# Patient Record
Sex: Male | Born: 1935 | Race: White | Hispanic: No | Smoking: Former smoker
Health system: Southern US, Community
[De-identification: ages and names within clinical notes are randomized; demographics above are authoritative.]

## PROBLEM LIST (undated history)

## (undated) DIAGNOSIS — J449 Chronic obstructive pulmonary disease, unspecified: Secondary | ICD-10-CM

## (undated) DIAGNOSIS — T7840XA Allergy, unspecified, initial encounter: Secondary | ICD-10-CM

## (undated) DIAGNOSIS — I2510 Atherosclerotic heart disease of native coronary artery without angina pectoris: Secondary | ICD-10-CM

## (undated) DIAGNOSIS — I10 Essential (primary) hypertension: Secondary | ICD-10-CM

## (undated) DIAGNOSIS — I480 Paroxysmal atrial fibrillation: Secondary | ICD-10-CM

## (undated) DIAGNOSIS — C679 Malignant neoplasm of bladder, unspecified: Secondary | ICD-10-CM

## (undated) DIAGNOSIS — I428 Other cardiomyopathies: Secondary | ICD-10-CM

## (undated) DIAGNOSIS — R32 Unspecified urinary incontinence: Secondary | ICD-10-CM

## (undated) DIAGNOSIS — I5042 Chronic combined systolic (congestive) and diastolic (congestive) heart failure: Secondary | ICD-10-CM

## (undated) DIAGNOSIS — E118 Type 2 diabetes mellitus with unspecified complications: Secondary | ICD-10-CM

## (undated) DIAGNOSIS — G250 Essential tremor: Secondary | ICD-10-CM

## (undated) DIAGNOSIS — E785 Hyperlipidemia, unspecified: Secondary | ICD-10-CM

## (undated) HISTORY — DX: Type 2 diabetes mellitus with unspecified complications: E11.8

## (undated) HISTORY — DX: Chronic obstructive pulmonary disease, unspecified: J44.9

## (undated) HISTORY — DX: Paroxysmal atrial fibrillation: I48.0

## (undated) HISTORY — PX: OTHER SURGICAL HISTORY: SHX169

## (undated) HISTORY — DX: Chronic combined systolic (congestive) and diastolic (congestive) heart failure: I50.42

## (undated) HISTORY — PX: HERNIA REPAIR: SHX51

## (undated) HISTORY — DX: Hyperlipidemia, unspecified: E78.5

## (undated) HISTORY — DX: Malignant neoplasm of bladder, unspecified: C67.9

## (undated) HISTORY — DX: Atherosclerotic heart disease of native coronary artery without angina pectoris: I25.10

## (undated) HISTORY — PX: CATARACT EXTRACTION: SUR2

## (undated) HISTORY — DX: Other cardiomyopathies: I42.8

## (undated) HISTORY — DX: Essential tremor: G25.0

## (undated) HISTORY — DX: Allergy, unspecified, initial encounter: T78.40XA

## (undated) HISTORY — DX: Unspecified urinary incontinence: R32

## (undated) HISTORY — DX: Essential (primary) hypertension: I10

---

## 2003-09-26 ENCOUNTER — Other Ambulatory Visit: Payer: Self-pay

## 2005-07-08 ENCOUNTER — Other Ambulatory Visit: Payer: Self-pay

## 2005-07-08 ENCOUNTER — Ambulatory Visit: Payer: Self-pay | Admitting: Urology

## 2005-07-12 ENCOUNTER — Ambulatory Visit: Payer: Self-pay | Admitting: Urology

## 2006-05-06 ENCOUNTER — Emergency Department: Payer: Self-pay | Admitting: Emergency Medicine

## 2006-05-07 ENCOUNTER — Other Ambulatory Visit: Payer: Self-pay

## 2009-12-13 ENCOUNTER — Inpatient Hospital Stay: Payer: Self-pay | Admitting: Internal Medicine

## 2010-07-06 ENCOUNTER — Ambulatory Visit: Payer: Self-pay | Admitting: Gastroenterology

## 2010-07-08 LAB — PATHOLOGY REPORT

## 2010-07-17 ENCOUNTER — Emergency Department: Payer: Self-pay | Admitting: Emergency Medicine

## 2010-08-05 ENCOUNTER — Emergency Department: Payer: Self-pay | Admitting: *Deleted

## 2010-08-09 ENCOUNTER — Emergency Department: Payer: Self-pay | Admitting: Emergency Medicine

## 2010-08-11 ENCOUNTER — Emergency Department: Payer: Self-pay | Admitting: Emergency Medicine

## 2011-01-31 ENCOUNTER — Ambulatory Visit: Payer: Self-pay | Admitting: Internal Medicine

## 2011-07-25 DIAGNOSIS — M79606 Pain in leg, unspecified: Secondary | ICD-10-CM | POA: Insufficient documentation

## 2011-08-10 DIAGNOSIS — E559 Vitamin D deficiency, unspecified: Secondary | ICD-10-CM | POA: Insufficient documentation

## 2011-08-30 ENCOUNTER — Ambulatory Visit: Payer: Self-pay | Admitting: Urology

## 2011-09-02 DIAGNOSIS — N138 Other obstructive and reflux uropathy: Secondary | ICD-10-CM | POA: Insufficient documentation

## 2011-09-02 DIAGNOSIS — N401 Enlarged prostate with lower urinary tract symptoms: Secondary | ICD-10-CM | POA: Insufficient documentation

## 2011-09-06 ENCOUNTER — Ambulatory Visit: Payer: Self-pay | Admitting: Urology

## 2011-09-09 LAB — PATHOLOGY REPORT

## 2011-12-15 ENCOUNTER — Ambulatory Visit: Payer: Self-pay | Admitting: General Practice

## 2011-12-15 LAB — CBC WITH DIFFERENTIAL/PLATELET
Basophil %: 1 %
Eosinophil #: 0.3 10*3/uL (ref 0.0–0.7)
Eosinophil %: 3.4 %
HGB: 14.1 g/dL (ref 13.0–18.0)
Lymphocyte #: 2.1 10*3/uL (ref 1.0–3.6)
Lymphocyte %: 22 %
MCHC: 33.3 g/dL (ref 32.0–36.0)
MCV: 102 fL — ABNORMAL HIGH (ref 80–100)
Monocyte #: 0.8 x10 3/mm (ref 0.2–1.0)
Monocyte %: 8.4 %
Neutrophil %: 65.2 %
RBC: 4.16 10*6/uL — ABNORMAL LOW (ref 4.40–5.90)
RDW: 13 % (ref 11.5–14.5)
WBC: 9.6 10*3/uL (ref 3.8–10.6)

## 2011-12-15 LAB — BASIC METABOLIC PANEL
BUN: 16 mg/dL (ref 7–18)
Chloride: 106 mmol/L (ref 98–107)
Co2: 29 mmol/L (ref 21–32)
Creatinine: 1.15 mg/dL (ref 0.60–1.30)
Potassium: 4.2 mmol/L (ref 3.5–5.1)
Sodium: 139 mmol/L (ref 136–145)

## 2012-02-04 LAB — HM DIABETES EYE EXAM

## 2012-02-13 ENCOUNTER — Ambulatory Visit: Payer: Self-pay | Admitting: Internal Medicine

## 2012-08-01 ENCOUNTER — Encounter: Payer: Self-pay | Admitting: Adult Health

## 2012-08-01 ENCOUNTER — Ambulatory Visit (INDEPENDENT_AMBULATORY_CARE_PROVIDER_SITE_OTHER): Payer: Medicare Other | Admitting: Adult Health

## 2012-08-01 VITALS — BP 108/62 | HR 94 | Temp 97.9°F | Resp 14 | Ht 71.5 in | Wt 216.5 lb

## 2012-08-01 DIAGNOSIS — E119 Type 2 diabetes mellitus without complications: Secondary | ICD-10-CM

## 2012-08-01 DIAGNOSIS — G20 Parkinson's disease: Secondary | ICD-10-CM

## 2012-08-01 DIAGNOSIS — R7989 Other specified abnormal findings of blood chemistry: Secondary | ICD-10-CM

## 2012-08-01 DIAGNOSIS — E291 Testicular hypofunction: Secondary | ICD-10-CM

## 2012-08-01 DIAGNOSIS — E118 Type 2 diabetes mellitus with unspecified complications: Secondary | ICD-10-CM | POA: Insufficient documentation

## 2012-08-01 DIAGNOSIS — I10 Essential (primary) hypertension: Secondary | ICD-10-CM | POA: Insufficient documentation

## 2012-08-01 HISTORY — DX: Type 2 diabetes mellitus with unspecified complications: E11.8

## 2012-08-01 MED ORDER — RIVASTIGMINE 13.3 MG/24HR TD PT24: 1.0000 | MEDICATED_PATCH | Freq: Every day | TRANSDERMAL | Status: DC

## 2012-08-01 MED ORDER — PAROXETINE HCL 40 MG PO TABS: 40.0000 mg | ORAL_TABLET | Freq: Every day | ORAL | Status: DC

## 2012-08-01 MED ORDER — TIOTROPIUM BROMIDE MONOHYDRATE 18 MCG IN CAPS: 18.0000 ug | ORAL_CAPSULE | Freq: Every day | RESPIRATORY_TRACT | Status: DC

## 2012-08-01 MED ORDER — METFORMIN HCL 1000 MG PO TABS: 1000.0000 mg | ORAL_TABLET | Freq: Two times a day (BID) | ORAL | Status: DC

## 2012-08-01 MED ORDER — GLYBURIDE 5 MG PO TABS: 5.0000 mg | ORAL_TABLET | Freq: Two times a day (BID) | ORAL | Status: DC

## 2012-08-01 NOTE — Assessment & Plan Note (Signed)
H/O low testosterone on replacement IM every 2 weeks. He is followed by Dr. Achilles Dunk for this. Explained to patient that he will need to continue to be follow by Dr. Achilles Dunk for his medication and monitoring.

## 2012-08-01 NOTE — Progress Notes (Signed)
Subjective:    Patient ID: Chase Simmons, male    DOB: 08-26-1933, 79 y.o.   MRN: [id number]  HPI  Patient presents to clinic to establish care. Most recently he was followed at [facility] in [facility] by Dr. Dayna Barker. He was also seen by a Neurologist at [facility]; however, patient does not remember doctors name. He has been seen by Dr. Cassie Freer approximately one year ago but he reports he will not return to see him. Patient is not a good historian. I will try to obtain medical records from previous PCP and specialists.  Patient states that he has trouble walking and that "it is all due to low testosterone". He has been followed by Dr. Achilles Dunk for bladder cancer and reports that Dr. Achilles Dunk prescribed his testosterone and monitored him. Patient, however, is requesting that I take this on and be the one to prescribe his medication.   Past Medical History  Diagnosis Date  . Cancer pt unsure of year    bladder cancer-Dr. Achilles Dunk  . Diabetes mellitus without complication   . COPD (chronic obstructive pulmonary disease)   . Allergy   . Arrhythmia   . Hypertension   . Hyperlipidemia   . Urinary incontinence   . UTI (lower urinary tract infection)   . Parkinson's disease     sees [facility] neurologist    Surgical Hx: Patient reports none.   Family History  Problem Relation Age of Onset  . Arthritis Mother   . Cancer Maternal Aunt     breast cancer  . Cancer Maternal Uncle     prostate    History   Social History  . Marital Status: Married    Spouse Name: N/A    Number of Children: 3  . Years of Education: 12   Occupational History  . Retired Naval architect    Social History Main Topics  . Smoking status: Former Smoker -- 1.50 packs/day for 40 years  . Smokeless tobacco: Never Used  . Alcohol Use: 6.0 oz/week    12 drink(s) per week     Comment: 1-2 drinks daily  . Drug Use: No  . Sexually Active: Not on file   Other Topics Concern  . Not on file   Social History Narrative   Patient grew up in [place], [place]. He is separated from his wife since 30. They never divorced. Patient has 1 son and 2 daughters. 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     Review of Systems  Constitutional: Negative.   HENT: Negative.   Eyes:       Yearly eye exam for diabetes at [facility]  Respiratory: Positive for shortness of breath. Negative for cough, chest tightness and wheezing.   Cardiovascular: Negative for chest pain.       Irregular heart rhythm  Gastrointestinal: Negative for nausea, vomiting, abdominal pain, diarrhea and blood in stool.  Genitourinary: Negative for dysuria, hematuria and difficulty urinating.  Musculoskeletal: Positive for arthralgias. Negative for myalgias.       Bilateral knee discomfort  Skin:       Thin frail skin  Allergic/Immunologic: Negative.   Neurological: Positive for tremors. Negative for dizziness, light-headedness, numbness and headaches.  Hematological: Negative.   Psychiatric/Behavioral: Negative for suicidal ideas, hallucinations, behavioral problems, sleep disturbance and agitation. The patient is not nervous/anxious.    BP 108/62  Pulse 94  Temp(Src) 97.9 F (36.6 C) (Oral)  Resp 14  Ht 5' 11.5" (1.816 m)  Wt  216 lb 8 oz (98.204 kg)  BMI 29.78 kg/m2  SpO2 99%    Objective:   Physical Exam  Constitutional:  Overweight, male in NAD  HENT:  Head: Normocephalic and atraumatic.  Right Ear: External ear normal.  Left Ear: External ear normal.  Nose: Nose normal.  Mouth/Throat: Oropharynx is clear and moist.  Eyes: Conjunctivae and EOM are normal. Pupils are equal, round, and reactive to light. Right eye exhibits no discharge. Left eye exhibits no discharge.  Blepharoptosis and periorbital puffiness OU  Neck: Normal range of motion. Neck supple. No tracheal deviation present. No thyromegaly present.  Cardiovascular: Normal rate, regular rhythm, normal heart sounds and intact distal pulses.  Exam reveals  no gallop and no friction rub.   No murmur heard. Pulmonary/Chest: Effort normal and breath sounds normal. No respiratory distress. He has no wheezes. He has no rales.  Abdominal: Soft. Bowel sounds are normal.  Musculoskeletal: He exhibits tenderness. He exhibits no edema.  Limited ROM. Decreased flexibility. Mild difficulty getting up from sitting position. Bilateral knee tenderness.  Lymphadenopathy:    He has no cervical adenopathy.  Neurological: He is alert. Coordination abnormal.  Significant hand tremors. Seen by Neurology at [facility]. Will request medical records.  Skin: Skin is warm and dry.  Psychiatric: He has a normal mood and affect.  Patient's behavior was somewhat odd. Patient attempted to bite the otoscope when asked to open mouth to examine pharynx.  Inapproriate references to prostitutes/prostitution.       Assessment & Plan:

## 2012-08-01 NOTE — Assessment & Plan Note (Signed)
Patient has seen a neurologist at [facility]. He he did not return for followup. It is uncertain whether he has a diagnosis of Parkinson's. He has significant upper extremity tremors. Request medical records from neurologist.

## 2012-08-01 NOTE — Assessment & Plan Note (Signed)
Well controlled on current medications. Request patient's medical records from previous PCP

## 2012-08-01 NOTE — Patient Instructions (Addendum)
   Thank you for choosing [facility] at [facility] for your health care needs.  I will request your medical records from your previous physicians.  Please call if any questions or concerns.

## 2012-08-01 NOTE — Assessment & Plan Note (Addendum)
Patient reports that his blood sugars are running around 140. However, he is not checking these often. Poor historian and unable to tell me when was the last time he had a hemoglobin A1c checked or other labs. Request medical records from previous PCP to determine further plan of care. Note, greater than 60 min were spent in face to face communication with patient in obtaining a medical history which was quite a challenge given patient poor recall of events, names of physicians, etc. Additional time spent in the assessment, evaluation, planning and implementation of plan of care.

## 2012-08-09 ENCOUNTER — Telehealth: Payer: Self-pay | Admitting: Adult Health

## 2012-08-09 NOTE — Telephone Encounter (Signed)
States if his records have not yet been received and you need any information, he has information regarding his heart doctor from [facility] and lung doctor from [facility], so please call him with any questions.

## 2012-08-09 NOTE — Telephone Encounter (Signed)
Records have already been sent for

## 2012-10-10 ENCOUNTER — Other Ambulatory Visit: Payer: Self-pay | Admitting: Adult Health

## 2012-10-22 ENCOUNTER — Ambulatory Visit (INDEPENDENT_AMBULATORY_CARE_PROVIDER_SITE_OTHER): Payer: Medicare Other | Admitting: Adult Health

## 2012-10-22 ENCOUNTER — Encounter: Payer: Self-pay | Admitting: Adult Health

## 2012-10-22 ENCOUNTER — Ambulatory Visit: Payer: Medicare Other | Admitting: Adult Health

## 2012-10-22 ENCOUNTER — Encounter (INDEPENDENT_AMBULATORY_CARE_PROVIDER_SITE_OTHER): Payer: Self-pay

## 2012-10-22 VITALS — BP 118/72 | HR 72 | Temp 97.8°F | Resp 12 | Wt 254.5 lb

## 2012-10-22 DIAGNOSIS — I10 Essential (primary) hypertension: Secondary | ICD-10-CM

## 2012-10-22 DIAGNOSIS — E119 Type 2 diabetes mellitus without complications: Secondary | ICD-10-CM

## 2012-10-22 DIAGNOSIS — E785 Hyperlipidemia, unspecified: Secondary | ICD-10-CM | POA: Insufficient documentation

## 2012-10-22 DIAGNOSIS — G250 Essential tremor: Secondary | ICD-10-CM

## 2012-10-22 MED ORDER — ALPRAZOLAM 0.25 MG PO TABS: ORAL_TABLET | ORAL | Status: DC

## 2012-10-22 MED ORDER — PRIMIDONE 50 MG PO TABS: ORAL_TABLET | ORAL | Status: DC

## 2012-10-22 NOTE — Patient Instructions (Signed)
  Stop Exelon patch  Primidone 50 mg tablets as follows:  Week #1 - take 1/2 tablet at bedtime  Week #2 - take 1 tablet at bedtime  Week #3 - take 1/2 tablet in the morning and 1 tablet at bedtime  Week #4 - take 1 tablet in the morning and 1 tablet at bedtime

## 2012-10-22 NOTE — Assessment & Plan Note (Signed)
Patient had been started on Exelon patches for the tremors. He reports the co-pays are too high. He is requesting an alternative. Start primidone 50 mg tablets as follows: Week # 1- take 1/2 tablet at bedtime; week #2 take 1 tablet at bedtime; week #3 take 1/2 tablet in the morning and 1 tablet at bedtime; week #4 take 1 tablet in the morning and 1 tablet at bedtime and continue.

## 2012-10-22 NOTE — Progress Notes (Signed)
Subjective:    Patient ID: Chase Simmons, male    DOB: Aug 02, 1933, 79 y.o.   MRN: [id number]  HPI  Patient is a 79 year old male with history of diabetes mellitus type 2, hypertension, hyperlipidemia, essential tremors who presents to clinic for followup. Patient reports that he is currently on Exelon patch for tremors. His insurance is getting ready to change and the Exelon patch is going to be greater than $100 co-pay. She is requesting an alternative medication for his tremors that is less expensive. He reports that he had seen a neurologist at [facility] who had recommended other options. Patient reports compliance with his medications. He reports that his daughter fills a pill box on a weekly basis for him.  Patient reports that his diabetes is well controlled. He reports blood sugars are running below 150. He is currently on metformin and glyburide.   Past Medical History  Diagnosis Date  . Cancer pt unsure of year    bladder cancer-Dr. Achilles Dunk  . Diabetes mellitus without complication   . COPD (chronic obstructive pulmonary disease)   . Allergy   . Arrhythmia   . Hypertension   . Hyperlipidemia   . Urinary incontinence   . UTI (lower urinary tract infection)   . Tremor, essential     sees [facility] neurologist    No past surgical history on file.   Family History  Problem Relation Age of Onset  . Arthritis Mother   . Cancer Maternal Aunt     breast cancer  . Cancer Maternal Uncle     prostate     History   Social History  . Marital Status: Married    Spouse Name: N/A    Number of Children: 3  . Years of Education: 12   Occupational History  . Retired Naval architect    Social History Main Topics  . Smoking status: Former Smoker -- 1.50 packs/day for 40 years  . Smokeless tobacco: Never Used  . Alcohol Use: 6.0 oz/week    12 drink(s) per week     Comment: 1-2 drinks daily  . Drug Use: No  . Sexual Activity: Not on file   Other Topics Concern  . Not on file   Social  History Narrative   Patient grew up in [place], [place]. He is separated from his wife since 76. They never divorced. Patient has 1 son and 2 daughters. 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      Current Outpatient Prescriptions on File Prior to Visit  Medication Sig Dispense Refill  . atorvastatin (LIPITOR) 80 MG tablet Take 80 mg by mouth daily.      . carvedilol (COREG) 12.5 MG tablet Take 12.5 mg by mouth 2 (two) times daily with a meal.      . glyBURIDE (DIABETA) 5 MG tablet Take 1 tablet (5 mg total) by mouth 2 (two) times daily with a meal.  60 tablet  6  . lisinopril (PRINIVIL,ZESTRIL) 10 MG tablet take 1 tablet by mouth once daily  90 tablet  2  . metFORMIN (GLUCOPHAGE) 1000 MG tablet Take 1 tablet (1,000 mg total) by mouth 2 (two) times daily with a meal.  60 tablet  6  . PARoxetine (PAXIL) 40 MG tablet Take 1 tablet (40 mg total) by mouth daily.  30 tablet  6  . tiotropium (SPIRIVA) 18 MCG inhalation capsule Place 1 capsule (18 mcg total) into inhaler and inhale daily.  30 capsule  6   No  current facility-administered medications on file prior to visit.      Review of Systems  Constitutional: Negative.   HENT: Negative.   Eyes: Negative.   Respiratory: Negative.  Negative for chest tightness, shortness of breath and wheezing.   Cardiovascular: Negative.  Negative for chest pain.  Gastrointestinal: Negative.   Endocrine: Negative.   Genitourinary: Negative.   Musculoskeletal: Positive for arthralgias. Negative for gait problem and myalgias.  Skin: Negative.   Allergic/Immunologic: Negative.   Neurological: Positive for tremors. Negative for dizziness, light-headedness and headaches.  Hematological: Negative.   Psychiatric/Behavioral: Negative.        Objective:   Physical Exam  Constitutional: He is oriented to person, place, and time.  79 year old male in no apparent distress  HENT:  Head: Normocephalic and atraumatic.  Eyes: Conjunctivae are  normal. Pupils are equal, round, and reactive to light.  Neck: Normal range of motion. No tracheal deviation present.  Cardiovascular: Normal rate, regular rhythm and normal heart sounds.  Exam reveals no gallop and no friction rub.   No murmur heard. Pulmonary/Chest: Effort normal and breath sounds normal. No respiratory distress. He has no wheezes. He has no rales. He exhibits no tenderness.  Abdominal: Soft. Bowel sounds are normal.  Neurological: He is alert and oriented to person, place, and time.  Bilateral hand tremors  Skin: Skin is warm and dry.  Psychiatric: He has a normal mood and affect. His behavior is normal. Judgment and thought content normal.    BP 118/72  Pulse 72  Temp(Src) 97.8 F (36.6 C) (Oral)  Resp 12  Wt 254 lb 8 oz (115.44 kg)  BMI 35 kg/m2  SpO2 96%       Assessment & Plan:

## 2012-10-22 NOTE — Assessment & Plan Note (Signed)
Check lipids. Patient is not fasting. He will return for fasting labs.

## 2012-10-22 NOTE — Assessment & Plan Note (Signed)
Remains well controlled on carvedilol and lisinopril.

## 2012-10-22 NOTE — Assessment & Plan Note (Signed)
Check hemoglobin A1c.

## 2012-10-26 ENCOUNTER — Other Ambulatory Visit (INDEPENDENT_AMBULATORY_CARE_PROVIDER_SITE_OTHER): Payer: Medicare Other

## 2012-10-26 DIAGNOSIS — I10 Essential (primary) hypertension: Secondary | ICD-10-CM

## 2012-10-26 DIAGNOSIS — E785 Hyperlipidemia, unspecified: Secondary | ICD-10-CM

## 2012-10-26 DIAGNOSIS — E119 Type 2 diabetes mellitus without complications: Secondary | ICD-10-CM

## 2012-10-26 LAB — LIPID PANEL
Cholesterol: 119 mg/dL (ref 0–200)
LDL Cholesterol: 59 mg/dL (ref 0–99)
Total CHOL/HDL Ratio: 3
Triglycerides: 77 mg/dL (ref 0.0–149.0)
VLDL: 15.4 mg/dL (ref 0.0–40.0)

## 2012-10-26 LAB — BASIC METABOLIC PANEL
BUN: 17 mg/dL (ref 6–23)
CO2: 31 mEq/L (ref 19–32)
Chloride: 102 mEq/L (ref 96–112)
Creatinine, Ser: 1 mg/dL (ref 0.4–1.5)
Potassium: 4.4 mEq/L (ref 3.5–5.1)

## 2012-10-26 LAB — HEMOGLOBIN A1C: Hgb A1c MFr Bld: 8.5 % — ABNORMAL HIGH (ref 4.6–6.5)

## 2012-10-29 ENCOUNTER — Other Ambulatory Visit: Payer: Self-pay | Admitting: *Deleted

## 2012-10-29 ENCOUNTER — Other Ambulatory Visit: Payer: Self-pay | Admitting: Adult Health

## 2012-10-29 MED ORDER — ATORVASTATIN CALCIUM 80 MG PO TABS: 80.0000 mg | ORAL_TABLET | Freq: Every day | ORAL | Status: DC

## 2012-10-29 MED ORDER — CANAGLIFLOZIN 100 MG PO TABS: 1.0000 | ORAL_TABLET | Freq: Every day | ORAL | Status: DC

## 2012-12-24 ENCOUNTER — Emergency Department: Payer: Self-pay | Admitting: Emergency Medicine

## 2012-12-26 LAB — BETA STREP CULTURE(ARMC)

## 2013-01-07 ENCOUNTER — Encounter (INDEPENDENT_AMBULATORY_CARE_PROVIDER_SITE_OTHER): Payer: Self-pay

## 2013-01-07 ENCOUNTER — Ambulatory Visit (INDEPENDENT_AMBULATORY_CARE_PROVIDER_SITE_OTHER): Payer: Medicare HMO | Admitting: Adult Health

## 2013-01-07 ENCOUNTER — Encounter: Payer: Self-pay | Admitting: Adult Health

## 2013-01-07 VITALS — BP 142/80 | HR 65 | Temp 98.2°F | Resp 12 | Wt 215.0 lb

## 2013-01-07 DIAGNOSIS — G250 Essential tremor: Secondary | ICD-10-CM

## 2013-01-07 DIAGNOSIS — E119 Type 2 diabetes mellitus without complications: Secondary | ICD-10-CM

## 2013-01-07 DIAGNOSIS — E1310 Other specified diabetes mellitus with ketoacidosis without coma: Secondary | ICD-10-CM

## 2013-01-07 DIAGNOSIS — G252 Other specified forms of tremor: Secondary | ICD-10-CM

## 2013-01-07 DIAGNOSIS — I10 Essential (primary) hypertension: Secondary | ICD-10-CM

## 2013-01-07 DIAGNOSIS — E291 Testicular hypofunction: Secondary | ICD-10-CM

## 2013-01-07 DIAGNOSIS — R7989 Other specified abnormal findings of blood chemistry: Secondary | ICD-10-CM

## 2013-01-07 DIAGNOSIS — E1110 Type 2 diabetes mellitus with ketoacidosis without coma: Secondary | ICD-10-CM

## 2013-01-07 LAB — HEMOGLOBIN A1C: Hgb A1c MFr Bld: 6.9 % — ABNORMAL HIGH (ref 4.6–6.5)

## 2013-01-07 MED ORDER — PRIMIDONE 50 MG PO TABS: 50.0000 mg | ORAL_TABLET | Freq: Two times a day (BID) | ORAL | Status: DC

## 2013-01-07 MED ORDER — PAROXETINE HCL 40 MG PO TABS: 40.0000 mg | ORAL_TABLET | Freq: Every day | ORAL | Status: DC

## 2013-01-07 MED ORDER — GLYBURIDE 5 MG PO TABS: 5.0000 mg | ORAL_TABLET | Freq: Two times a day (BID) | ORAL | Status: DC

## 2013-01-07 MED ORDER — CARVEDILOL 12.5 MG PO TABS: 12.5000 mg | ORAL_TABLET | Freq: Two times a day (BID) | ORAL | Status: DC

## 2013-01-07 MED ORDER — ALPRAZOLAM 0.25 MG PO TABS: ORAL_TABLET | ORAL | Status: DC

## 2013-01-07 NOTE — Assessment & Plan Note (Signed)
Followed by Dr. Jacqlyn Larsen. Patient receiving testosterone injections every 2 weeks.

## 2013-01-07 NOTE — Progress Notes (Signed)
Pre visit review using our clinic review tool, if applicable. No additional management support is needed unless otherwise documented below in the visit note. 

## 2013-01-07 NOTE — Patient Instructions (Addendum)
  I have sent refills for your medications to your pharmacy.  Take primidone 50 mg 1 tablet in the morning and 1 tablet in the evening. This is for your tremors. If you feel that your need a higher dose please call me. Do not take additional medication without discussing with me first.  Continue your diabetes medication. I am checking your levels today. I will call you with results once they are available.  I want you to return to clinic in 1-2 months for a follow up on your tremors.

## 2013-01-07 NOTE — Assessment & Plan Note (Signed)
Reports blood sugars below 120. Taking medications as prescribed and watching diet. Check hemoglobin A1c today. Followup in 3 months

## 2013-01-07 NOTE — Assessment & Plan Note (Signed)
Taking medications as prescribed. Refills as needed. Continue to follow

## 2013-01-07 NOTE — Assessment & Plan Note (Addendum)
Patient has not been taking medication for the past 2 weeks. He reports that his daughter would not get his medication from pharmacy. Suggested that he try switching to a pharmacy that can deliver to his home. Discussed importance of taking medication exactly as ordered. Noticed that his tremors were worse today. Restart primidone 50 mg twice a day. Followup in one to 2 months. Discussed that we would adjust dose as necessary.

## 2013-01-07 NOTE — Progress Notes (Signed)
Subjective:    Patient ID: Chase Simmons, male    DOB: 05-May-1933, 80 y.o.   MRN: [id number]  HPI  Patient is a 80 year old male who presents to clinic for the following:  1)  Tremors - essential tremors. Has been seen by [facility] Neurology with recommendation of starting Mysoline. This was started during his previous visit. However, he reports running out of medication approximately 2 weeks ago. He did not call to request refills. Also reports having some trouble getting medication - states "my daughter would not get the medication for me from the pharmacy". He is not able to explain why. His daughter is in the waiting room but he is adamant that she not come in to the exam room with him.  2)  Follow up DM - he reports taking his medications as prescribed. Reports blood glucose levels running below 120. Last HgbA1c was 8.5% secondary to dietary indiscretions and not taking his medication. He reports he has been doing well with his diet.  3)  Follow up HTN - taking medications mostly as prescribed; although, admits that he sometimes forgets to take. Stable pressure. Watching sodium intake. Denies lower extremity edema.  4)  Low Testosterone - Followed by Dr. Jacqlyn Larsen.   Current Outpatient Prescriptions on File Prior to Visit  Medication Sig Dispense Refill  . atorvastatin (LIPITOR) 80 MG tablet Take 1 tablet (80 mg total) by mouth daily.  30 tablet  5  . lisinopril (PRINIVIL,ZESTRIL) 10 MG tablet take 1 tablet by mouth once daily  90 tablet  2  . metFORMIN (GLUCOPHAGE) 1000 MG tablet Take 1 tablet (1,000 mg total) by mouth 2 (two) times daily with a meal.  60 tablet  6  . tiotropium (SPIRIVA) 18 MCG inhalation capsule Place 1 capsule (18 mcg total) into inhaler and inhale daily.  30 capsule  6   No current facility-administered medications on file prior to visit.     Past Medical History  Diagnosis Date  . Cancer pt unsure of year    bladder cancer-Dr. Jacqlyn Larsen  . Diabetes mellitus without  complication   . COPD (chronic obstructive pulmonary disease)   . Allergy   . Arrhythmia   . Hypertension   . Hyperlipidemia   . Urinary incontinence   . UTI (lower urinary tract infection)   . Tremor, essential     sees [facility] neurologist     No past surgical history on file.   Family History  Problem Relation Age of Onset  . Arthritis Mother   . Cancer Maternal Aunt     breast cancer  . Cancer Maternal Uncle     prostate     History   Social History  . Marital Status: Married    Spouse Name: N/A    Number of Children: 3  . Years of Education: 12   Occupational History  . Retired Administrator    Social History Main Topics  . Smoking status: Former Smoker -- 1.50 packs/day for 40 years  . Smokeless tobacco: Never Used  . Alcohol Use: 6.0 oz/week    12 drink(s) per week     Comment: 1-2 drinks daily  . Drug Use: No  . Sexual Activity: Not on file   Other Topics Concern  . Not on file   Social History Narrative   Patient grew up in [place], [place]. He is separated from his wife since 39. They never divorced. Patient has 1 son and 2 daughters. He worked as a truck  driver. He completed high school.     Review of Systems  Constitutional: Positive for fatigue. Negative for fever, chills, appetite change and unexpected weight change.  HENT: Negative.   Respiratory: Negative.   Cardiovascular: Negative for chest pain, palpitations and leg swelling.  Gastrointestinal: Negative.   Genitourinary: Negative for dysuria, hematuria and flank pain.       Sees Dr. Jacqlyn Larsen every 6 months  Musculoskeletal:       Right shoulder decreased ROM. Reports this happened after falling some time ago.   Neurological: Positive for tremors. Negative for dizziness, weakness, light-headedness and numbness.  Hematological: Negative.   Psychiatric/Behavioral: Negative for suicidal ideas, hallucinations, confusion, sleep disturbance, decreased concentration and agitation. The patient is not  nervous/anxious.   All other systems reviewed and are negative.       Objective:   Physical Exam  Constitutional: He is oriented to person, place, and time.  80 y/o male in NAD  HENT:  Head: Normocephalic and atraumatic.  Poor dentition  Neck: Normal range of motion. No tracheal deviation present.  Cardiovascular: Normal rate, regular rhythm, normal heart sounds and intact distal pulses.  Exam reveals no gallop.   No murmur heard. Pulmonary/Chest: Effort normal and breath sounds normal. No respiratory distress. He has no wheezes. He has no rales.  Abdominal: Soft. Bowel sounds are normal.  Musculoskeletal: He exhibits no edema and no tenderness.  Decreased ROM right shoulder. Abduction limited - cannot raise right arm above his head.  Lymphadenopathy:    He has no cervical adenopathy.  Neurological: He is alert and oriented to person, place, and time.  Tremors bilateral upper extremities. Has not taken his medication in > 2 weeks  Skin: Skin is warm and dry.  Psychiatric: He has a normal mood and affect.  Inapropriate conversations. Constant sexual innuendo.    BP 142/80  Pulse 65  Temp(Src) 98.2 F (36.8 C) (Oral)  Resp 12  Wt 215 lb (97.523 kg)  SpO2 95%       Assessment & Plan:

## 2013-01-08 ENCOUNTER — Telehealth: Payer: Self-pay | Admitting: Adult Health

## 2013-01-08 NOTE — Telephone Encounter (Signed)
The patient has been off this medication(primidone (MYSOLINE) 50 MG tablet for a couple of weeks. The patient is wanting to know should he just start taking it twice a day as instructed on his prescription or should he start taking it gradually and build up to twice a day. Please advise.

## 2013-01-08 NOTE — Telephone Encounter (Signed)
Pt notified and verbalized understanding.

## 2013-01-08 NOTE — Telephone Encounter (Signed)
Start 50 mg bid.

## 2013-02-18 ENCOUNTER — Telehealth: Payer: Self-pay | Admitting: *Deleted

## 2013-02-18 NOTE — Telephone Encounter (Signed)
Received fax from [facility] that Glyburide will no longer be covered. Advised pt to contact his insurance to see what equivalent medication is on his formulary. Pt verbalized understanding and will call back with alternatives.

## 2013-02-27 ENCOUNTER — Telehealth: Payer: Self-pay | Admitting: Emergency Medicine

## 2013-02-27 NOTE — Telephone Encounter (Signed)
Pt does not need referral for Dr. Vickki Muff @ [facility] podiatry. Specialist is [place].

## 2013-03-04 ENCOUNTER — Encounter: Payer: Self-pay | Admitting: Adult Health

## 2013-03-04 ENCOUNTER — Ambulatory Visit (INDEPENDENT_AMBULATORY_CARE_PROVIDER_SITE_OTHER): Payer: Medicare HMO | Admitting: Adult Health

## 2013-03-04 VITALS — BP 130/68 | HR 65 | Temp 98.3°F | Resp 14 | Wt 237.0 lb

## 2013-03-04 DIAGNOSIS — Z79899 Other long term (current) drug therapy: Secondary | ICD-10-CM

## 2013-03-04 DIAGNOSIS — R197 Diarrhea, unspecified: Secondary | ICD-10-CM

## 2013-03-04 DIAGNOSIS — Z7189 Other specified counseling: Secondary | ICD-10-CM | POA: Insufficient documentation

## 2013-03-04 DIAGNOSIS — K529 Noninfective gastroenteritis and colitis, unspecified: Secondary | ICD-10-CM

## 2013-03-04 MED ORDER — GLIMEPIRIDE 2 MG PO TABS: 2.0000 mg | ORAL_TABLET | Freq: Every day | ORAL | Status: DC

## 2013-03-04 NOTE — Progress Notes (Signed)
Subjective:    Patient ID: Chase Simmons, male    DOB: 1933-06-04, 80 y.o.   MRN: [id number]  HPI  Pt is a 80 y/o male who presents to clinic with concerns about chronic diarrhea. He reports that he has been to GI doctor and had colonoscopy (cannot remember if it was 2-3 years ago). He reports that his prep was inadequate and therefore the test was inconclusive. Pt has been having ongoing symptoms. Has seen blood in his stool and also reports bloating. Denies abdominal pain. His appetite remains intact.  He also brings with him a prior authorization letter regarding his glyburide 5 mg tablets. He has been on this medication for several years without any problems; however, his insurance will no longer cover medication. He called the insurance and they told him they would cover Glimepiride. He would like to switch to this medication as it will be more cost effective for him. He still has glyburide tablets remaining and he will continue with these until they run out.   Past Medical History  Diagnosis Date  . Cancer pt unsure of year    bladder cancer-Dr. Jacqlyn Larsen  . Diabetes mellitus without complication   . COPD (chronic obstructive pulmonary disease)   . Allergy   . Arrhythmia   . Hypertension   . Hyperlipidemia   . Urinary incontinence   . UTI (lower urinary tract infection)   . Tremor, essential     sees [facility] neurologist     Current Outpatient Prescriptions on File Prior to Visit  Medication Sig Dispense Refill  . ALPRAZolam (XANAX) 0.25 MG tablet Take 1 tablet twice a day as needed for anxiety.  60 tablet  0  . atorvastatin (LIPITOR) 80 MG tablet Take 1 tablet (80 mg total) by mouth daily.  30 tablet  5  . carvedilol (COREG) 12.5 MG tablet Take 1 tablet (12.5 mg total) by mouth 2 (two) times daily with a meal.  60 tablet  6  . lisinopril (PRINIVIL,ZESTRIL) 10 MG tablet take 1 tablet by mouth once daily  90 tablet  2  . metFORMIN (GLUCOPHAGE) 1000 MG tablet Take 1 tablet (1,000 mg  total) by mouth 2 (two) times daily with a meal.  60 tablet  6  . PARoxetine (PAXIL) 40 MG tablet Take 1 tablet (40 mg total) by mouth daily.  30 tablet  6  . primidone (MYSOLINE) 50 MG tablet Take 1 tablet (50 mg total) by mouth 2 (two) times daily.  60 tablet  3  . tiotropium (SPIRIVA) 18 MCG inhalation capsule Place 1 capsule (18 mcg total) into inhaler and inhale daily.  30 capsule  6   No current facility-administered medications on file prior to visit.    Review of Systems  Constitutional: Negative for fatigue.  Respiratory: Negative.   Cardiovascular: Negative.   Gastrointestinal: Positive for diarrhea, blood in stool and abdominal distention. Negative for nausea, vomiting and abdominal pain.  Musculoskeletal: Negative.   Neurological: Negative.   Psychiatric/Behavioral: Negative.   All other systems reviewed and are negative.       Objective:   Physical Exam  Constitutional: He is oriented to person, place, and time.  Obese, 80 y/o male in NAD  Cardiovascular: Normal rate, regular rhythm and normal heart sounds.   Pulmonary/Chest: Effort normal. No respiratory distress.  Abdominal: Soft. Bowel sounds are normal. He exhibits distension. He exhibits no mass. There is no tenderness. There is no rebound and no guarding.  Neurological: He is alert and  oriented to person, place, and time.  Skin: Skin is warm and dry.  Psychiatric: He has a normal mood and affect. His behavior is normal. Judgment and thought content normal.      Assessment & Plan:   1. Chronic diarrhea Pt has had problems with diarrhea "on and off" for several years. Last colonoscopy was inconclusive 2/2 inadequate bowel prep. Occasional blood noticed in stool. Will refer to GI for further evaluation and recommendations.   - Ambulatory referral to Gastroenterology  2. Medication management Pt has been on glyburide for years. Insurance will no longer cover. Will prescribe glimepiride

## 2013-03-04 NOTE — Progress Notes (Signed)
Pre visit review using our clinic review tool, if applicable. No additional management support is needed unless otherwise documented below in the visit note. 

## 2013-03-04 NOTE — Patient Instructions (Signed)
  I am referring you to GI for your abdominal problems of chronic diarrhea and bloating.  Their office will call you with an appointment.  Regarding your medication Glyburide 5 mg - continue to take this medication until you run out. I will send in a new prescription to your pharmacy for Glimepiride 2 mg daily. You are only to start this medication when you run out of the glyburide. Your insurance will no longer pay for the glyburide.  Call with any questions or concerns.

## 2013-04-12 ENCOUNTER — Other Ambulatory Visit: Payer: Self-pay | Admitting: Gastroenterology

## 2013-04-12 LAB — CLOSTRIDIUM DIFFICILE(ARMC)

## 2013-04-15 LAB — STOOL CULTURE

## 2013-04-21 ENCOUNTER — Other Ambulatory Visit: Payer: Self-pay | Admitting: Adult Health

## 2013-04-22 NOTE — Telephone Encounter (Signed)
Electronic Rx request, Please advise. 

## 2013-05-01 ENCOUNTER — Other Ambulatory Visit: Payer: Self-pay | Admitting: Adult Health

## 2013-05-02 NOTE — Telephone Encounter (Signed)
Ok to refill 

## 2013-05-03 NOTE — Telephone Encounter (Signed)
Yes. Ok to fill 

## 2013-05-06 ENCOUNTER — Encounter: Payer: Self-pay | Admitting: Adult Health

## 2013-05-06 ENCOUNTER — Ambulatory Visit (INDEPENDENT_AMBULATORY_CARE_PROVIDER_SITE_OTHER): Payer: Medicare HMO | Admitting: Adult Health

## 2013-05-06 VITALS — BP 122/62 | HR 55 | Resp 16 | Wt 231.0 lb

## 2013-05-06 DIAGNOSIS — R2689 Other abnormalities of gait and mobility: Secondary | ICD-10-CM

## 2013-05-06 DIAGNOSIS — E785 Hyperlipidemia, unspecified: Secondary | ICD-10-CM

## 2013-05-06 DIAGNOSIS — R7989 Other specified abnormal findings of blood chemistry: Secondary | ICD-10-CM

## 2013-05-06 DIAGNOSIS — I10 Essential (primary) hypertension: Secondary | ICD-10-CM

## 2013-05-06 DIAGNOSIS — R29818 Other symptoms and signs involving the nervous system: Secondary | ICD-10-CM

## 2013-05-06 DIAGNOSIS — E119 Type 2 diabetes mellitus without complications: Secondary | ICD-10-CM

## 2013-05-06 DIAGNOSIS — E291 Testicular hypofunction: Secondary | ICD-10-CM

## 2013-05-06 NOTE — Progress Notes (Signed)
Subjective:    Patient ID: Chase Simmons, male    DOB: [birth year], 80 y.o.   MRN: [id number]  HPI  Patient is a 80 year old male who presents to clinic for followup diabetes, hypertension and hyperlipidemia. He reports that he has seen GI for his ongoing diarrhea. This has improved. They wanted to proceed with colonoscopy and patient reports that his cardiologist has cleared him for same. He will call to have this scheduled.  Chase Simmons recently saw his urologist for a followup. He was restarted on his testosterone injections. He reports feeling better since starting medication.  He has been having balance problems. Denies dizziness. We reviewed his medications. His pulse is low and we discussed possibly decreasing his carvedilol. He does not believe that the carvedilol is causing him to be off balance. He reports that  He has had low heart rate for many years. He denies falls. Reports that he manages well with holding on to things or using a cane.  Past Medical History  Diagnosis Date  . Cancer pt unsure of year    bladder cancer-Chase Simmons  . Diabetes mellitus without complication   . COPD (chronic obstructive pulmonary disease)   . Allergy   . Arrhythmia   . Hypertension   . Hyperlipidemia   . Urinary incontinence   . UTI (lower urinary tract infection)   . Tremor, essential     sees [facility] neurologist    Current Outpatient Prescriptions on File Prior to Visit  Medication Sig Dispense Refill  . ALPRAZolam (XANAX) 0.25 MG tablet take 1 tablet by mouth twice a day if needed for anxiety  60 tablet  0  . atorvastatin (LIPITOR) 80 MG tablet Take 1 tablet (80 mg total) by mouth daily.  30 tablet  5  . carvedilol (COREG) 12.5 MG tablet Take 1 tablet (12.5 mg total) by mouth 2 (two) times daily with a meal.  60 tablet  6  . glimepiride (AMARYL) 2 MG tablet Take 1 tablet (2 mg total) by mouth daily before breakfast.  30 tablet  3  . lisinopril (PRINIVIL,ZESTRIL) 10 MG tablet take 1 tablet  by mouth once daily  90 tablet  2  . metFORMIN (GLUCOPHAGE) 1000 MG tablet Take 1 tablet (1,000 mg total) by mouth 2 (two) times daily with a meal.  60 tablet  6  . PARoxetine (PAXIL) 40 MG tablet Take 1 tablet (40 mg total) by mouth daily.  30 tablet  6  . primidone (MYSOLINE) 50 MG tablet take 1 tablet by mouth twice a day  60 tablet  2  . tiotropium (SPIRIVA) 18 MCG inhalation capsule Place 1 capsule (18 mcg total) into inhaler and inhale daily.  30 capsule  6   No current facility-administered medications on file prior to visit.    Review of Systems  Constitutional: Negative.   HENT: Positive for hearing loss (this is not new). Negative for congestion, ear discharge and sinus pressure.   Respiratory: Negative.  Negative for cough, chest tightness and shortness of breath.   Cardiovascular: Negative for chest pain and leg swelling.  Gastrointestinal: Positive for diarrhea (improved).  Genitourinary: Negative.   Musculoskeletal: Positive for gait problem (off balance).  Neurological: Negative for dizziness, light-headedness and headaches.  Psychiatric/Behavioral: Negative.   All other systems reviewed and are negative.      Objective:   Physical Exam  Constitutional: He is oriented to person, place, and time. He appears well-developed and well-nourished. No distress.  HENT:  Head: Normocephalic  and atraumatic.  Bilateral ears have some cerumen buildup.  Eyes: Conjunctivae and EOM are normal.  Neck: Normal range of motion.  Cardiovascular: Normal rate, regular rhythm, normal heart sounds and intact distal pulses.  Exam reveals no gallop and no friction rub.   No murmur heard. Pulmonary/Chest: Effort normal and breath sounds normal. No respiratory distress. He has no wheezes. He has no rales.  Musculoskeletal: Normal range of motion. He exhibits no edema.  Lymphadenopathy:    He has no cervical adenopathy.  Neurological: He is alert and oriented to person, place, and time.  Coordination abnormal.  Balance disorder  Skin: Skin is warm and dry.  Psychiatric: He has a normal mood and affect. His behavior is normal. Judgment and thought content normal.      Assessment & Plan:   1. DM type 2 (diabetes mellitus, type 2) Has been taking medications as prescribed. Check A1C. Continue to follow. - Hemoglobin A1c; Future - Hemoglobin A1c  2. HTN (hypertension) Blood pressure well controlled. Bradycardia. Suggested we decrease the carvedilol but pt does not believe this is a problem. Reports that his pulse has always been low - Basic metabolic panel  3. HLD (hyperlipidemia) On atorvastatin. Check liver panel. Continue to follow - Hepatic function panel  4. Balance disorder Suggested decreasing carvedilol 2/2 bradycardia but pt did not want. I will refer him to ENT for evaluation of inner ear problems - Ambulatory referral to ENT  5. Low testosterone Recently evaluated by Chase Simmons. Restarted testosterone replacement. Pt feels improved.

## 2013-05-06 NOTE — Progress Notes (Signed)
Pre visit review using our clinic review tool, if applicable. No additional management support is needed unless otherwise documented below in the visit note. 

## 2013-05-06 NOTE — Patient Instructions (Signed)
  Please have your labs drawn prior to leaving the office.  We will contact you with results once they are available.  I am referring you to ENT to evaluate inner ear for your balance problems  Please call the GI doctor to find out about your appointment for colonoscopy.  Return for your complete physical exam in October or November of this year.

## 2013-05-07 ENCOUNTER — Telehealth: Payer: Self-pay | Admitting: Adult Health

## 2013-05-07 LAB — BASIC METABOLIC PANEL
BUN: 19 mg/dL (ref 6–23)
CHLORIDE: 105 meq/L (ref 96–112)
CO2: 28 meq/L (ref 19–32)
CREATININE: 1.3 mg/dL (ref 0.4–1.5)
Calcium: 9.4 mg/dL (ref 8.4–10.5)
GFR: 59.33 mL/min — ABNORMAL LOW (ref >60.00)
GLUCOSE: 141 mg/dL — AB (ref 70–99)
Potassium: 4.7 mEq/L (ref 3.5–5.1)
Sodium: 139 mEq/L (ref 135–145)

## 2013-05-07 LAB — HEPATIC FUNCTION PANEL
ALT: 35 U/L (ref 0–53)
AST: 29 U/L (ref 0–37)
Albumin: 3.5 g/dL (ref 3.5–5.2)
Alkaline Phosphatase: 53 U/L (ref 39–117)
Bilirubin, Direct: 0.1 mg/dL (ref 0.0–0.3)
TOTAL PROTEIN: 6.3 g/dL (ref 6.0–8.3)
Total Bilirubin: 0.5 mg/dL (ref 0.2–1.2)

## 2013-05-07 LAB — HEMOGLOBIN A1C: Hgb A1c MFr Bld: 7.5 % — ABNORMAL HIGH (ref 4.6–6.5)

## 2013-05-07 NOTE — Telephone Encounter (Signed)
Relevant patient education mailed to patient.  

## 2013-05-15 ENCOUNTER — Telehealth: Payer: Self-pay

## 2013-05-15 NOTE — Telephone Encounter (Signed)
Relevant patient education mailed to patient.  

## 2013-07-01 ENCOUNTER — Other Ambulatory Visit: Payer: Self-pay | Admitting: Adult Health

## 2013-07-19 ENCOUNTER — Other Ambulatory Visit: Payer: Self-pay | Admitting: Adult Health

## 2013-07-22 ENCOUNTER — Other Ambulatory Visit: Payer: Self-pay | Admitting: *Deleted

## 2013-07-22 MED ORDER — PAROXETINE HCL 40 MG PO TABS: 40.0000 mg | ORAL_TABLET | Freq: Every day | ORAL | Status: DC

## 2013-07-29 ENCOUNTER — Other Ambulatory Visit: Payer: Self-pay | Admitting: *Deleted

## 2013-07-29 MED ORDER — METFORMIN HCL 1000 MG PO TABS: ORAL_TABLET | ORAL | Status: DC

## 2013-07-29 MED ORDER — ATORVASTATIN CALCIUM 80 MG PO TABS: ORAL_TABLET | ORAL | Status: DC

## 2013-07-29 MED ORDER — CARVEDILOL 12.5 MG PO TABS: 12.5000 mg | ORAL_TABLET | Freq: Two times a day (BID) | ORAL | Status: DC

## 2013-07-29 MED ORDER — PRIMIDONE 50 MG PO TABS: ORAL_TABLET | ORAL | Status: DC

## 2013-07-29 MED ORDER — LISINOPRIL 10 MG PO TABS
ORAL_TABLET | ORAL | Status: DC
Start: 2013-07-29 — End: 2014-04-01

## 2013-07-29 NOTE — Addendum Note (Signed)
Addended by: Wynonia Lawman E on: 07/29/2013 12:37 PM   Modules accepted: Orders

## 2013-08-30 DIAGNOSIS — K219 Gastro-esophageal reflux disease without esophagitis: Secondary | ICD-10-CM | POA: Insufficient documentation

## 2013-08-30 DIAGNOSIS — I480 Paroxysmal atrial fibrillation: Secondary | ICD-10-CM | POA: Insufficient documentation

## 2013-09-16 ENCOUNTER — Other Ambulatory Visit: Payer: Self-pay | Admitting: Adult Health

## 2013-10-17 ENCOUNTER — Telehealth: Payer: Self-pay | Admitting: Internal Medicine

## 2013-10-17 NOTE — Telephone Encounter (Signed)
Chase Simmons called saying he spoke to someone yesterday and the call became disconnected. He was returning the phone call. I didn't see anything documented in his file from where he spoke to someone yesterday. I told him I'd pass along his message. He thinks it was Dr. Lupita Dawn nurse calling him since he was a patient of [facility] Thank you.

## 2013-10-17 NOTE — Telephone Encounter (Signed)
Spoke with pt, advised no one from [facility] called him

## 2013-11-19 ENCOUNTER — Ambulatory Visit: Payer: Self-pay | Admitting: Gastroenterology

## 2013-11-19 LAB — HM COLONOSCOPY

## 2013-12-11 ENCOUNTER — Encounter: Payer: Self-pay | Admitting: *Deleted

## 2013-12-19 ENCOUNTER — Encounter: Payer: Self-pay | Admitting: Internal Medicine

## 2013-12-25 ENCOUNTER — Other Ambulatory Visit: Payer: Self-pay | Admitting: *Deleted

## 2013-12-25 MED ORDER — PRIMIDONE 50 MG PO TABS: ORAL_TABLET | ORAL | Status: DC

## 2013-12-25 MED ORDER — CARVEDILOL 12.5 MG PO TABS: 12.5000 mg | ORAL_TABLET | Freq: Two times a day (BID) | ORAL | Status: DC

## 2013-12-30 ENCOUNTER — Other Ambulatory Visit: Payer: Self-pay | Admitting: *Deleted

## 2013-12-30 MED ORDER — PAROXETINE HCL 40 MG PO TABS: 40.0000 mg | ORAL_TABLET | Freq: Every day | ORAL | Status: DC

## 2013-12-31 ENCOUNTER — Other Ambulatory Visit: Payer: Self-pay

## 2013-12-31 MED ORDER — METFORMIN HCL 1000 MG PO TABS: ORAL_TABLET | ORAL | Status: DC

## 2013-12-31 NOTE — Telephone Encounter (Signed)
Paper request filled. 

## 2014-01-08 ENCOUNTER — Ambulatory Visit: Payer: Commercial Managed Care - HMO | Admitting: Internal Medicine

## 2014-01-08 ENCOUNTER — Emergency Department: Payer: Self-pay | Admitting: Emergency Medicine

## 2014-01-08 LAB — CBC WITH DIFFERENTIAL/PLATELET
BASOS ABS: 0 10*3/uL (ref 0.0–0.1)
Basophil %: 0.9 %
EOS ABS: 0.1 10*3/uL (ref 0.0–0.7)
Eosinophil %: 2.7 %
HCT: 41 % (ref 40.0–52.0)
HGB: 13.7 g/dL (ref 13.0–18.0)
LYMPHS ABS: 1 10*3/uL (ref 1.0–3.6)
Lymphocyte %: 19.5 %
MCH: 34.4 pg — ABNORMAL HIGH (ref 26.0–34.0)
MCHC: 33.4 g/dL (ref 32.0–36.0)
MCV: 103 fL — AB (ref 80–100)
Monocyte #: 0.9 x10 3/mm (ref 0.2–1.0)
Monocyte %: 18.6 %
Neutrophil #: 2.9 10*3/uL (ref 1.4–6.5)
Neutrophil %: 58.3 %
Platelet: 165 10*3/uL (ref 150–440)
RBC: 3.97 10*6/uL — ABNORMAL LOW (ref 4.40–5.90)
RDW: 12.7 % (ref 11.5–14.5)
WBC: 5 10*3/uL (ref 3.8–10.6)

## 2014-01-08 LAB — BASIC METABOLIC PANEL
Anion Gap: 9 (ref 7–16)
BUN: 18 mg/dL (ref 7–18)
CHLORIDE: 102 mmol/L (ref 98–107)
CO2: 26 mmol/L (ref 21–32)
Calcium, Total: 8.4 mg/dL — ABNORMAL LOW (ref 8.5–10.1)
Creatinine: 1.13 mg/dL (ref 0.60–1.30)
EGFR (African American): >60
GLUCOSE: 172 mg/dL — AB (ref 65–99)
Osmolality: 280 (ref 275–301)
Potassium: 4.4 mmol/L (ref 3.5–5.1)
SODIUM: 137 mmol/L (ref 136–145)

## 2014-01-08 LAB — TROPONIN I: Troponin-I: 0.04 ng/mL

## 2014-01-09 ENCOUNTER — Ambulatory Visit: Payer: Commercial Managed Care - HMO | Admitting: Internal Medicine

## 2014-03-31 ENCOUNTER — Ambulatory Visit: Payer: Commercial Managed Care - HMO | Admitting: Nurse Practitioner

## 2014-04-01 ENCOUNTER — Ambulatory Visit (INDEPENDENT_AMBULATORY_CARE_PROVIDER_SITE_OTHER): Payer: Commercial Managed Care - HMO | Admitting: Nurse Practitioner

## 2014-04-01 ENCOUNTER — Encounter: Payer: Self-pay | Admitting: Nurse Practitioner

## 2014-04-01 VITALS — BP 130/72 | HR 100 | Temp 98.1°F | Resp 16 | Ht 71.0 in | Wt 236.8 lb

## 2014-04-01 DIAGNOSIS — I10 Essential (primary) hypertension: Secondary | ICD-10-CM | POA: Diagnosis not present

## 2014-04-01 DIAGNOSIS — G250 Essential tremor: Secondary | ICD-10-CM

## 2014-04-01 DIAGNOSIS — E119 Type 2 diabetes mellitus without complications: Secondary | ICD-10-CM | POA: Diagnosis not present

## 2014-04-01 DIAGNOSIS — D239 Other benign neoplasm of skin, unspecified: Secondary | ICD-10-CM | POA: Diagnosis not present

## 2014-04-01 DIAGNOSIS — D229 Melanocytic nevi, unspecified: Secondary | ICD-10-CM

## 2014-04-01 DIAGNOSIS — E785 Hyperlipidemia, unspecified: Secondary | ICD-10-CM

## 2014-04-01 DIAGNOSIS — F411 Generalized anxiety disorder: Secondary | ICD-10-CM

## 2014-04-01 MED ORDER — TIOTROPIUM BROMIDE MONOHYDRATE 18 MCG IN CAPS: 18.0000 ug | ORAL_CAPSULE | Freq: Every day | RESPIRATORY_TRACT | Status: DC

## 2014-04-01 MED ORDER — ATORVASTATIN CALCIUM 80 MG PO TABS: ORAL_TABLET | ORAL | Status: DC

## 2014-04-01 MED ORDER — LISINOPRIL 10 MG PO TABS: ORAL_TABLET | ORAL | Status: DC

## 2014-04-01 MED ORDER — CARVEDILOL 12.5 MG PO TABS: 12.5000 mg | ORAL_TABLET | Freq: Two times a day (BID) | ORAL | Status: DC

## 2014-04-01 MED ORDER — PRIMIDONE 50 MG PO TABS: ORAL_TABLET | ORAL | Status: DC

## 2014-04-01 MED ORDER — METFORMIN HCL 1000 MG PO TABS: ORAL_TABLET | ORAL | Status: DC

## 2014-04-01 MED ORDER — PAROXETINE HCL 40 MG PO TABS: 40.0000 mg | ORAL_TABLET | Freq: Every day | ORAL | Status: DC

## 2014-04-01 MED ORDER — ALPRAZOLAM 0.25 MG PO TABS: ORAL_TABLET | ORAL | Status: DC

## 2014-04-01 NOTE — Progress Notes (Signed)
   Subjective:    Patient ID: Chase Simmons, male    DOB: 03/06/1933, 81 y.o.   MRN: [id number]  HPI  Chase Simmons needs refills.   1) HTN, Hyperlipidemia, and Type 2 DM medications and needs labs.   2) Question about mole on chest- midline sternum and he states is has changed recently.   Review of Systems  Constitutional: Negative for fever, chills, diaphoresis and fatigue.  Eyes: Negative for visual disturbance.  Respiratory: Negative for chest tightness, shortness of breath and wheezing.   Cardiovascular: Negative for chest pain, palpitations and leg swelling.  Gastrointestinal: Negative for nausea, vomiting, diarrhea and rectal pain.  Skin: Negative for rash.  Neurological: Negative for dizziness, weakness and numbness.  Psychiatric/Behavioral: The patient is not nervous/anxious.       Objective:   Physical Exam  Constitutional: He is oriented to person, place, and time. He appears well-developed and well-nourished. No distress.  BP 130/72 mmHg  Pulse 100  Temp(Src) 98.1 F (36.7 C) (Oral)  Resp 16  Ht 5\' 11"  (1.803 m)  Wt 236 lb 12.8 oz (107.412 kg)  BMI 33.04 kg/m2  SpO2 95%   HENT:  Head: Normocephalic and atraumatic.  Right Ear: External ear normal.  Left Ear: External ear normal.  Cardiovascular: Normal rate, regular rhythm, normal heart sounds and intact distal pulses.  Exam reveals no gallop and no friction rub.   No murmur heard. Pulmonary/Chest: Effort normal and breath sounds normal. No respiratory distress. He has no wheezes. He has no rales. He exhibits no tenderness.  Neurological: He is alert and oriented to person, place, and time.  Skin: Skin is warm and dry. No rash noted. He is not diaphoretic.  Psychiatric: He has a normal mood and affect. His behavior is normal. Judgment and thought content normal.      Assessment & Plan:

## 2014-04-01 NOTE — Patient Instructions (Addendum)
Follow up in 6 months.   Please visit the lab before leaving today.

## 2014-04-01 NOTE — Progress Notes (Signed)
Pre visit review using our clinic review tool, if applicable. No additional management support is needed unless otherwise documented below in the visit note. 

## 2014-04-02 LAB — COMPREHENSIVE METABOLIC PANEL
ALK PHOS: 67 U/L (ref 39–117)
ALT: 21 U/L (ref 0–53)
AST: 20 U/L (ref 0–37)
Albumin: 3.9 g/dL (ref 3.5–5.2)
BILIRUBIN TOTAL: 0.7 mg/dL (ref 0.2–1.2)
BUN: 26 mg/dL — AB (ref 6–23)
CALCIUM: 9.7 mg/dL (ref 8.4–10.5)
CHLORIDE: 103 meq/L (ref 96–112)
CO2: 29 mEq/L (ref 19–32)
CREATININE: 1.06 mg/dL (ref 0.40–1.50)
GFR: 71.6 mL/min (ref >60.00)
Glucose, Bld: 232 mg/dL — ABNORMAL HIGH (ref 70–99)
Potassium: 4.4 mEq/L (ref 3.5–5.1)
Sodium: 137 mEq/L (ref 135–145)
Total Protein: 7.1 g/dL (ref 6.0–8.3)

## 2014-04-02 LAB — CBC WITH DIFFERENTIAL/PLATELET
Basophils Absolute: 0 10*3/uL (ref 0.0–0.1)
Basophils Relative: 0.4 % (ref 0.0–3.0)
EOS ABS: 0.3 10*3/uL (ref 0.0–0.7)
EOS PCT: 3.3 % (ref 0.0–5.0)
HEMATOCRIT: 42 % (ref 39.0–52.0)
Hemoglobin: 14.1 g/dL (ref 13.0–17.0)
Lymphocytes Relative: 21 % (ref 12.0–46.0)
Lymphs Abs: 1.8 10*3/uL (ref 0.7–4.0)
MCHC: 33.6 g/dL (ref 30.0–36.0)
MCV: 100.5 fl — ABNORMAL HIGH (ref 78.0–100.0)
Monocytes Absolute: 0.7 10*3/uL (ref 0.1–1.0)
Monocytes Relative: 8.3 % (ref 3.0–12.0)
NEUTROS ABS: 5.6 10*3/uL (ref 1.4–7.7)
Neutrophils Relative %: 67 % (ref 43.0–77.0)
Platelets: 212 10*3/uL (ref 150.0–400.0)
RBC: 4.18 Mil/uL — ABNORMAL LOW (ref 4.22–5.81)
RDW: 12.9 % (ref 11.5–15.5)
WBC: 8.4 10*3/uL (ref 4.0–10.5)

## 2014-04-02 LAB — LIPID PANEL
Cholesterol: 128 mg/dL (ref 0–200)
HDL: 46.5 mg/dL (ref >39.00)
LDL Cholesterol: 65 mg/dL (ref 0–99)
NonHDL: 81.5
Total CHOL/HDL Ratio: 3
Triglycerides: 82 mg/dL (ref 0.0–149.0)
VLDL: 16.4 mg/dL (ref 0.0–40.0)

## 2014-04-02 LAB — HEMOGLOBIN A1C: HEMOGLOBIN A1C: 8.8 % — AB (ref 4.6–6.5)

## 2014-04-03 LAB — LDL CHOLESTEROL, DIRECT: Direct LDL: 69 mg/dL

## 2014-04-04 ENCOUNTER — Telehealth: Payer: Self-pay

## 2014-04-04 NOTE — Telephone Encounter (Signed)
Called pt x2.  No answer.  Left another message to return call to my extension.

## 2014-04-04 NOTE — Telephone Encounter (Signed)
The patient stated he missed a call from this number, but no message was left.  He is hoping a call can be returned.

## 2014-04-05 DIAGNOSIS — D229 Melanocytic nevi, unspecified: Secondary | ICD-10-CM | POA: Insufficient documentation

## 2014-04-05 NOTE — Assessment & Plan Note (Signed)
Refilled xanax. Will follow.

## 2014-04-05 NOTE — Assessment & Plan Note (Signed)
Refilled carvedilol and lisinopril. Will obtain CMET.

## 2014-04-05 NOTE — Assessment & Plan Note (Signed)
Refilled metformin. Obtain A1c.

## 2014-04-05 NOTE — Assessment & Plan Note (Signed)
Refilled lipitor. Obtain Lipid panel.

## 2014-04-05 NOTE — Assessment & Plan Note (Signed)
Referral to dermatology.  

## 2014-04-05 NOTE — Assessment & Plan Note (Signed)
Refilled primidone. Will follow.

## 2014-04-22 NOTE — Op Note (Signed)
PATIENT NAME:  Chase Simmons, Chase Simmons MR#:  [id number] DATE OF BIRTH:  Dec 03, 1933  DATE OF PROCEDURE:  09/06/2011  PREOPERATIVE DIAGNOSIS: Bladder tumor.   POSTOPERATIVE DIAGNOSIS: Bladder tumor.  PROCEDURES: Cystoscopy, bladder biopsy, mitomycin bladder instillation.   SURGEON: Edrick Oh, M.D.   ANESTHESIA: General mask.   INDICATIONS: The patient is a 81 year old white gentleman with a long history of bladder tumor. Recent cystoscopy demonstrated an area of irregular mucosa on the posterior dome of the bladder that was suspicious for early carcinoma in situ or transitional cell carcinoma. He presents for biopsy with mitomycin bladder instillation.   DESCRIPTION OF PROCEDURE: After informed consent was obtained, the patient was taken to the Operating Room and placed in the dorsal lithotomy position under general mask anesthesia. The patient was then prepped and draped in the usual standard fashion. The 47 French rigid cystoscope was introduced into the urethra under direct vision with no urethral abnormalities noted. Upon entering the bladder, the mucosa was inspected in its entirety. There was an approximate 2 cm area of irregular, slightly erythematous mucosa on the posterior dome of the bladder. The margins were fairly well defined. Minimal hypervascularity was noted in the irregular tissue. Cold-cup biopsy forceps were obtained throughout the area. The area was then extensively cauterized utilizing the Bugbee electrode. No significant bleeding was encountered. The bladder was drained. The cystoscope was removed. An 63 French red rubber catheter was then inserted without difficulty. 20 mg of mitomycin reconstituted in 50 mg of sterile water was instilled into the urinary bladder. The catheter was removed. Proper chemotherapy protocol was        followed. The patient was then returned to the supine position. He was awakened from general mask anesthesia and was taken to the recovery room in stable  condition. There were no problems or complications. The patient tolerated the procedure well.  ____________________________ Denice Bors. Jacqlyn Larsen, MD bsc:slb D: 09/06/2011 12:59:40 ET T: 09/06/2011 13:10:15 ET JOB#: [id number]  cc: Denice Bors. Jacqlyn Larsen, MD, <Dictator> Denice Bors Detavious Rinn MD ELECTRONICALLY SIGNED 09/08/2011 11:54

## 2014-04-28 LAB — SURGICAL PATHOLOGY

## 2015-01-08 ENCOUNTER — Ambulatory Visit (INDEPENDENT_AMBULATORY_CARE_PROVIDER_SITE_OTHER): Payer: Commercial Managed Care - HMO | Admitting: Nurse Practitioner

## 2015-01-08 VITALS — BP 124/80 | HR 72 | Ht 70.0 in | Wt 244.0 lb

## 2015-01-08 DIAGNOSIS — K529 Noninfective gastroenteritis and colitis, unspecified: Secondary | ICD-10-CM | POA: Diagnosis not present

## 2015-01-08 DIAGNOSIS — E119 Type 2 diabetes mellitus without complications: Secondary | ICD-10-CM | POA: Diagnosis not present

## 2015-01-08 DIAGNOSIS — G250 Essential tremor: Secondary | ICD-10-CM | POA: Diagnosis not present

## 2015-01-08 DIAGNOSIS — Z23 Encounter for immunization: Secondary | ICD-10-CM | POA: Diagnosis not present

## 2015-01-08 DIAGNOSIS — I10 Essential (primary) hypertension: Secondary | ICD-10-CM

## 2015-01-08 DIAGNOSIS — D239 Other benign neoplasm of skin, unspecified: Secondary | ICD-10-CM

## 2015-01-08 DIAGNOSIS — E785 Hyperlipidemia, unspecified: Secondary | ICD-10-CM

## 2015-01-08 DIAGNOSIS — D229 Melanocytic nevi, unspecified: Secondary | ICD-10-CM

## 2015-01-08 DIAGNOSIS — F411 Generalized anxiety disorder: Secondary | ICD-10-CM

## 2015-01-08 LAB — CBC WITH DIFFERENTIAL/PLATELET
BASOS PCT: 0.7 % (ref 0.0–3.0)
Basophils Absolute: 0.1 10*3/uL (ref 0.0–0.1)
EOS PCT: 4.3 % (ref 0.0–5.0)
Eosinophils Absolute: 0.4 10*3/uL (ref 0.0–0.7)
HCT: 42.9 % (ref 39.0–52.0)
HEMOGLOBIN: 14.2 g/dL (ref 13.0–17.0)
LYMPHS ABS: 2.1 10*3/uL (ref 0.7–4.0)
Lymphocytes Relative: 25.2 % (ref 12.0–46.0)
MCHC: 33.1 g/dL (ref 30.0–36.0)
MCV: 102.9 fl — AB (ref 78.0–100.0)
MONO ABS: 0.8 10*3/uL (ref 0.1–1.0)
MONOS PCT: 9.6 % (ref 3.0–12.0)
Neutro Abs: 5.1 10*3/uL (ref 1.4–7.7)
Neutrophils Relative %: 60.2 % (ref 43.0–77.0)
Platelets: 200 10*3/uL (ref 150.0–400.0)
RBC: 4.17 Mil/uL — ABNORMAL LOW (ref 4.22–5.81)
RDW: 12.5 % (ref 11.5–15.5)
WBC: 8.4 10*3/uL (ref 4.0–10.5)

## 2015-01-08 LAB — COMPREHENSIVE METABOLIC PANEL
ALBUMIN: 3.9 g/dL (ref 3.5–5.2)
ALK PHOS: 76 U/L (ref 39–117)
ALT: 23 U/L (ref 0–53)
AST: 18 U/L (ref 0–37)
BUN: 29 mg/dL — AB (ref 6–23)
CHLORIDE: 103 meq/L (ref 96–112)
CO2: 30 mEq/L (ref 19–32)
Calcium: 9.5 mg/dL (ref 8.4–10.5)
Creatinine, Ser: 1.1 mg/dL (ref 0.40–1.50)
GFR: 68.47 mL/min (ref >60.00)
Glucose, Bld: 296 mg/dL — ABNORMAL HIGH (ref 70–99)
POTASSIUM: 4.7 meq/L (ref 3.5–5.1)
SODIUM: 137 meq/L (ref 135–145)
Total Bilirubin: 0.6 mg/dL (ref 0.2–1.2)
Total Protein: 6.7 g/dL (ref 6.0–8.3)

## 2015-01-08 LAB — HEMOGLOBIN A1C: HEMOGLOBIN A1C: 9.1 % — AB (ref 4.6–6.5)

## 2015-01-08 MED ORDER — METFORMIN HCL 1000 MG PO TABS: ORAL_TABLET | ORAL | Status: DC

## 2015-01-08 MED ORDER — CARVEDILOL 12.5 MG PO TABS: 12.5000 mg | ORAL_TABLET | Freq: Two times a day (BID) | ORAL | Status: DC

## 2015-01-08 MED ORDER — TIOTROPIUM BROMIDE MONOHYDRATE 18 MCG IN CAPS: 18.0000 ug | ORAL_CAPSULE | Freq: Every day | RESPIRATORY_TRACT | Status: DC

## 2015-01-08 MED ORDER — LISINOPRIL 10 MG PO TABS: ORAL_TABLET | ORAL | Status: DC

## 2015-01-08 MED ORDER — PAROXETINE HCL 40 MG PO TABS: 40.0000 mg | ORAL_TABLET | Freq: Every day | ORAL | Status: DC

## 2015-01-08 MED ORDER — ATORVASTATIN CALCIUM 80 MG PO TABS: ORAL_TABLET | ORAL | Status: DC

## 2015-01-08 MED ORDER — ALPRAZOLAM 0.25 MG PO TABS: ORAL_TABLET | ORAL | Status: DC

## 2015-01-08 MED ORDER — PRIMIDONE 50 MG PO TABS: ORAL_TABLET | ORAL | Status: DC

## 2015-01-08 MED ORDER — GLIMEPIRIDE 2 MG PO TABS: 2.0000 mg | ORAL_TABLET | Freq: Every day | ORAL | Status: DC

## 2015-01-08 NOTE — Progress Notes (Signed)
Patient ID: Chase Simmons, male    DOB: January 20, 1933  Age: 82 y.o. MRN: [id number]  CC: Follow-up   HPI Chase Simmons presents for follow up of diabetes, chronic diarrhea, medication refills, and labs.   1) Refills- Needs today  Foot exam- today   Fungal changes to great toenails  Eye exam-   Goes to [facility] center, had one this past year he reports    Flu shot today   Diet- eating banana sandwiches daily  Exercise- not active  2) Does not see Dr. Jacqlyn Larsen any more  Saw GI specialist in past- for diarrhea; finds that when he drinks coffee all day at [employer] with his friends in a social setting the diarrhea occurs. Has cut back on coffee.  History Chase Simmons has a past medical history of Cancer [facility]) (pt unsure of year); Diabetes mellitus without complication ([place]); COPD (chronic obstructive pulmonary disease) ([place]); Allergy; Arrhythmia; Hypertension; Hyperlipidemia; Urinary incontinence; UTI (lower urinary tract infection); and Tremor, essential.   He has no past surgical history on file.   His family history includes Arthritis in his mother; Cancer in his maternal aunt and maternal uncle.He reports that he has quit smoking. He has never used smokeless tobacco. He reports that he drinks about 7.2 oz of alcohol per week. He reports that he does not use illicit drugs.  Outpatient Prescriptions Prior to Visit  Medication Sig Dispense Refill  . ALPRAZolam (XANAX) 0.25 MG tablet take 1 tablet by mouth twice a day if needed for anxiety 60 tablet 0  . atorvastatin (LIPITOR) 80 MG tablet TAKE 1 TABLET ONE TIME DAILY 90 tablet 2  . carvedilol (COREG) 12.5 MG tablet Take 1 tablet (12.5 mg total) by mouth 2 (two) times daily with a meal. 180 tablet 3  . glimepiride (AMARYL) 2 MG tablet Take 1 tablet (2 mg total) by mouth daily before breakfast. 30 tablet 3  . lisinopril (PRINIVIL,ZESTRIL) 10 MG tablet take 1 tablet by mouth once daily 90 tablet 3  . metFORMIN (GLUCOPHAGE) 1000 MG tablet  take 1 tablet by mouth twice a day with food 180 tablet 3  . PARoxetine (PAXIL) 40 MG tablet Take 1 tablet (40 mg total) by mouth daily. 90 tablet 3  . primidone (MYSOLINE) 50 MG tablet take 1 tablet by mouth twice a day 180 tablet 3  . tiotropium (SPIRIVA) 18 MCG inhalation capsule Place 1 capsule (18 mcg total) into inhaler and inhale daily. 30 capsule 6   No facility-administered medications prior to visit.    ROS Review of Systems  Constitutional: Negative for fever, chills, diaphoresis and fatigue.  Eyes: Negative for visual disturbance.  Respiratory: Negative for chest tightness, shortness of breath and wheezing.   Cardiovascular: Negative for chest pain, palpitations and leg swelling.  Gastrointestinal: Negative for nausea, vomiting and diarrhea.  Endocrine: Negative for polydipsia, polyphagia and polyuria.  Skin: Negative for rash.  Neurological: Negative for dizziness, weakness and numbness.  Psychiatric/Behavioral: The patient is not nervous/anxious.     Objective:  BP 124/80 mmHg  Pulse 72  Ht 5\' 10"  (1.778 m)  Wt 244 lb (110.678 kg)  BMI 35.01 kg/m2  SpO2 96%  Physical Exam  Constitutional: He is oriented to person, place, and time. He appears well-developed and well-nourished. No distress.  HENT:  Head: Normocephalic and atraumatic.  Right Ear: External ear normal.  Left Ear: External ear normal.  Eyes: EOM are normal. Pupils are equal, round, and reactive to light. Right eye exhibits no  discharge. Left eye exhibits no discharge. No scleral icterus.  Cardiovascular: Normal rate, regular rhythm, normal heart sounds and intact distal pulses.  Exam reveals no gallop and no friction rub.   No murmur heard. Pulmonary/Chest: Effort normal and breath sounds normal. No respiratory distress. He has no wheezes. He has no rales. He exhibits no tenderness.  Neurological: He is alert and oriented to person, place, and time.  Monofilament exam 10/10 bilateral  Skin: Skin is  warm and dry. No rash noted. He is not diaphoretic.  Psychiatric: He has a normal mood and affect. His behavior is normal. Judgment and thought content normal.   Assessment & Plan:   Chase Simmons was seen today for follow-up.  Diagnoses and all orders for this visit:  Encounter for immunization  Essential hypertension -     CBC with Differential/Platelet -     Comprehensive metabolic panel -     Hemoglobin A1c  Type 2 diabetes mellitus without complication, without long-term current use of insulin (HCC) -     CBC with Differential/Platelet -     Comprehensive metabolic panel -     Hemoglobin A1c  Chronic diarrhea  Tremor, essential  Atypical nevi  HLD (hyperlipidemia)  Anxiety state  Other orders -     Flu Vaccine QUAD 36+ mos IM -     ALPRAZolam (XANAX) 0.25 MG tablet; take 1 tablet by mouth twice a day if needed for anxiety -     atorvastatin (LIPITOR) 80 MG tablet; TAKE 1 TABLET ONE TIME DAILY -     carvedilol (COREG) 12.5 MG tablet; Take 1 tablet (12.5 mg total) by mouth 2 (two) times daily with a meal. -     glimepiride (AMARYL) 2 MG tablet; Take 1 tablet (2 mg total) by mouth daily before breakfast. -     lisinopril (PRINIVIL,ZESTRIL) 10 MG tablet; take 1 tablet by mouth once daily -     metFORMIN (GLUCOPHAGE) 1000 MG tablet; take 1 tablet by mouth twice a day with food -     PARoxetine (PAXIL) 40 MG tablet; Take 1 tablet (40 mg total) by mouth daily. -     primidone (MYSOLINE) 50 MG tablet; take 1 tablet by mouth twice a day -     tiotropium (SPIRIVA) 18 MCG inhalation capsule; Place 1 capsule (18 mcg total) into inhaler and inhale daily.   I am having Chase Simmons maintain his ALPRAZolam, atorvastatin, carvedilol, glimepiride, lisinopril, metFORMIN, PARoxetine, primidone, and tiotropium.  Meds ordered this encounter  Medications  . ALPRAZolam (XANAX) 0.25 MG tablet    Sig: take 1 tablet by mouth twice a day if needed for anxiety    Dispense:  60 tablet    Refill:  1     Order Specific Question:  Supervising Provider    Answer:  Deborra Medina L [[id number]]  . atorvastatin (LIPITOR) 80 MG tablet    Sig: TAKE 1 TABLET ONE TIME DAILY    Dispense:  90 tablet    Refill:  1    Order Specific Question:  Supervising Provider    Answer:  Deborra Medina L [[id number]]  . carvedilol (COREG) 12.5 MG tablet    Sig: Take 1 tablet (12.5 mg total) by mouth 2 (two) times daily with a meal.    Dispense:  180 tablet    Refill:  1    Order Specific Question:  Supervising Provider    Answer:  Deborra Medina L [[id number]]  . glimepiride (AMARYL) 2 MG tablet  Sig: Take 1 tablet (2 mg total) by mouth daily before breakfast.    Dispense:  90 tablet    Refill:  1    Order Specific Question:  Supervising Provider    Answer:  Deborra Medina L [[id number]]  . lisinopril (PRINIVIL,ZESTRIL) 10 MG tablet    Sig: take 1 tablet by mouth once daily    Dispense:  90 tablet    Refill:  1    Order Specific Question:  Supervising Provider    Answer:  Deborra Medina L [[id number]]  . metFORMIN (GLUCOPHAGE) 1000 MG tablet    Sig: take 1 tablet by mouth twice a day with food    Dispense:  180 tablet    Refill:  1    Order Specific Question:  Supervising Provider    Answer:  Deborra Medina L [[id number]]  . PARoxetine (PAXIL) 40 MG tablet    Sig: Take 1 tablet (40 mg total) by mouth daily.    Dispense:  90 tablet    Refill:  1    Order Specific Question:  Supervising Provider    Answer:  Deborra Medina L [[id number]]  . primidone (MYSOLINE) 50 MG tablet    Sig: take 1 tablet by mouth twice a day    Dispense:  180 tablet    Refill:  1    Order Specific Question:  Supervising Provider    Answer:  Deborra Medina L [[id number]]  . tiotropium (SPIRIVA) 18 MCG inhalation capsule    Sig: Place 1 capsule (18 mcg total) into inhaler and inhale daily.    Dispense:  30 capsule    Refill:  5    Order Specific Question:  Supervising Provider    Answer:  Crecencio Mc [[id number]]     Follow-up: Return in about 3 months (around  04/08/2015) for Follow up for care.

## 2015-01-08 NOTE — Patient Instructions (Addendum)
Follow up in 3 months

## 2015-01-16 ENCOUNTER — Telehealth: Payer: Self-pay

## 2015-01-16 NOTE — Telephone Encounter (Signed)
Daughter was unclear about Amaryl Rx for Mr. Chase Simmons, should he replace other diabetic meds with the Amaryl or should he be taking the Amaryl in addition to the other diabetic meds. Please advise./tvw

## 2015-01-16 NOTE — Telephone Encounter (Signed)
seton to me in error.  you patient

## 2015-01-18 ENCOUNTER — Encounter: Payer: Self-pay | Admitting: Nurse Practitioner

## 2015-01-18 NOTE — Assessment & Plan Note (Signed)
Resolved after stopping drinking coffee all day (8 hrs) at [employer] with his friends. His daughter helped him realize this.

## 2015-01-18 NOTE — Assessment & Plan Note (Signed)
Not fasting. Refilled lipitor. Need lipid panel soon, preferrably fasting

## 2015-01-18 NOTE — Assessment & Plan Note (Signed)
BP Readings from Last 3 Encounters:  01/08/15 124/80  04/01/14 130/72  05/06/13 122/62   Stable and controlled. Obtaining CMET and CBC w/ diff today. Continiue current regimen. Refills given

## 2015-01-18 NOTE — Assessment & Plan Note (Signed)
Lab Results  Component Value Date   HGBA1C 9.1* 01/08/2015   HGBA1C 8.8* 04/01/2014   HGBA1C 7.5* 05/06/2013   Lab Results  Component Value Date   LDLCALC 65 04/01/2014   CREATININE 1.10 01/08/2015   Will obtain A1c today, foot exam done today- fungal toe nail changes

## 2015-01-18 NOTE — Assessment & Plan Note (Signed)
Never went to dermatologist. Does not wish to go at this time. The areas look to be seborrheic keratoses

## 2015-01-18 NOTE — Assessment & Plan Note (Signed)
Still using xanax prn. He is requesting a refill. I also advised coffee may be affecting his heart rate and increase anxiety.

## 2015-01-18 NOTE — Assessment & Plan Note (Signed)
Stable. Refills of primidone given.

## 2015-01-19 NOTE — Telephone Encounter (Signed)
Called and notified pt of Chase Simmons instruction./tvw

## 2015-01-19 NOTE — Telephone Encounter (Signed)
He should be taking Amaryl and the metformin with meals as prescribed daily. Please let daughter know. Thank you!

## 2015-02-19 ENCOUNTER — Ambulatory Visit: Payer: Self-pay | Admitting: Nurse Practitioner

## 2015-03-06 ENCOUNTER — Ambulatory Visit: Payer: Self-pay | Admitting: Nurse Practitioner

## 2015-03-16 ENCOUNTER — Encounter: Payer: Self-pay | Admitting: Nurse Practitioner

## 2015-03-16 ENCOUNTER — Ambulatory Visit (INDEPENDENT_AMBULATORY_CARE_PROVIDER_SITE_OTHER): Payer: Commercial Managed Care - HMO | Admitting: Nurse Practitioner

## 2015-03-16 VITALS — BP 120/78 | HR 72 | Temp 98.1°F | Ht 70.0 in | Wt 239.5 lb

## 2015-03-16 DIAGNOSIS — Z91199 Patient's noncompliance with other medical treatment and regimen due to unspecified reason: Secondary | ICD-10-CM

## 2015-03-16 DIAGNOSIS — M25561 Pain in right knee: Secondary | ICD-10-CM | POA: Insufficient documentation

## 2015-03-16 DIAGNOSIS — Z9119 Patient's noncompliance with other medical treatment and regimen: Secondary | ICD-10-CM | POA: Diagnosis not present

## 2015-03-16 DIAGNOSIS — E118 Type 2 diabetes mellitus with unspecified complications: Secondary | ICD-10-CM

## 2015-03-16 DIAGNOSIS — G250 Essential tremor: Secondary | ICD-10-CM

## 2015-03-16 DIAGNOSIS — I10 Essential (primary) hypertension: Secondary | ICD-10-CM

## 2015-03-16 DIAGNOSIS — R799 Abnormal finding of blood chemistry, unspecified: Secondary | ICD-10-CM | POA: Diagnosis not present

## 2015-03-16 DIAGNOSIS — E785 Hyperlipidemia, unspecified: Secondary | ICD-10-CM

## 2015-03-16 DIAGNOSIS — M25562 Pain in left knee: Secondary | ICD-10-CM

## 2015-03-16 LAB — BASIC METABOLIC PANEL
BUN: 34 mg/dL — ABNORMAL HIGH (ref 6–23)
CHLORIDE: 104 meq/L (ref 96–112)
CO2: 25 meq/L (ref 19–32)
CREATININE: 1.22 mg/dL (ref 0.40–1.50)
Calcium: 9.3 mg/dL (ref 8.4–10.5)
GFR: 60.73 mL/min (ref >60.00)
Glucose, Bld: 153 mg/dL — ABNORMAL HIGH (ref 70–99)
POTASSIUM: 4.5 meq/L (ref 3.5–5.1)
Sodium: 140 mEq/L (ref 135–145)

## 2015-03-16 MED ORDER — GLIMEPIRIDE 4 MG PO TABS: 4.0000 mg | ORAL_TABLET | Freq: Every day | ORAL | Status: DC

## 2015-03-16 NOTE — Progress Notes (Signed)
Patient ID: Chase Simmons, male    DOB: 07-19-1933  Age: 81 y.o. MRN: [id number]  CC: Follow-up   HPI Chase Simmons presents for follow up of lab work in Jan.  1) Pt BUN and glucose was elevated  Pt reported to nurse he was eating banana sandwiches and cereal  He reports he would check Chase BS more often  A1c was 9.1  Taking Metformin Twice daily and Amaryl 2 mg before breakfast   2) patient reports weakness with walking He reports he is doing exercises at home to improve this, but is unable to show me exactly what he is doing Chase Simmons did not accompany him in the room today because he is calling her "stupid" because she wants him to use a cane with walking. He denies any recent falls He has completed physical therapy in the past  History Chase Simmons has a past medical history of Cancer [facility]) (pt unsure of year); Diabetes mellitus without complication ([place]); COPD (chronic obstructive pulmonary disease) ([place]); Allergy; Arrhythmia; Hypertension; Hyperlipidemia; Urinary incontinence; UTI (lower urinary tract infection); and Tremor, essential.   He has no past surgical history on file.   Chase family history includes Arthritis in Chase mother; Cancer in Chase maternal aunt and maternal uncle.He reports that he has quit smoking. He has never used smokeless tobacco. He reports that he drinks about 7.2 oz of alcohol per week. He reports that he does not use illicit drugs.  Outpatient Prescriptions Prior to Visit  Medication Sig Dispense Refill  . ALPRAZolam (XANAX) 0.25 MG tablet take 1 tablet by mouth twice a day if needed for anxiety 60 tablet 1  . atorvastatin (LIPITOR) 80 MG tablet TAKE 1 TABLET ONE TIME DAILY 90 tablet 1  . carvedilol (COREG) 12.5 MG tablet Take 1 tablet (12.5 mg total) by mouth 2 (two) times daily with a meal. 180 tablet 1  . lisinopril (PRINIVIL,ZESTRIL) 10 MG tablet take 1 tablet by mouth once daily 90 tablet 1  . metFORMIN (GLUCOPHAGE) 1000 MG tablet take 1 tablet by  mouth twice a day with food 180 tablet 1  . PARoxetine (PAXIL) 40 MG tablet Take 1 tablet (40 mg total) by mouth daily. 90 tablet 1  . primidone (MYSOLINE) 50 MG tablet take 1 tablet by mouth twice a day 180 tablet 1  . tiotropium (SPIRIVA) 18 MCG inhalation capsule Place 1 capsule (18 mcg total) into inhaler and inhale daily. 30 capsule 5  . glimepiride (AMARYL) 2 MG tablet Take 1 tablet (2 mg total) by mouth daily before breakfast. 90 tablet 1   No facility-administered medications prior to visit.    ROS Review of Systems  Constitutional: Negative for fever, chills, diaphoresis and fatigue.  Eyes: Negative for visual disturbance.  Respiratory: Negative for chest tightness, shortness of breath and wheezing.   Cardiovascular: Negative for chest pain, palpitations and leg swelling.  Gastrointestinal: Negative for nausea, vomiting and diarrhea.  Musculoskeletal: Positive for gait problem.  Skin: Negative for rash.  Neurological: Positive for tremors and weakness. Negative for dizziness, seizures, syncope, facial asymmetry, speech difficulty, light-headedness, numbness and headaches.  Psychiatric/Behavioral: The patient is not nervous/anxious.     Objective:  BP 120/78 mmHg  Pulse 72  Temp(Src) 98.1 F (36.7 C) (Oral)  Ht 5\' 10"  (1.778 m)  Wt 239 lb 8 oz (108.636 kg)  BMI 34.36 kg/m2  SpO2 96%  Physical Exam  Constitutional: He is oriented to person, place, and time. He appears well-developed. No distress.  Patient's  clothes and hair are disheveled today  HENT:  Head: Normocephalic and atraumatic.  Right Ear: External ear normal.  Left Ear: External ear normal.  Cardiovascular: Normal rate and regular rhythm.   Pulmonary/Chest: Effort normal and breath sounds normal. No respiratory distress. He has no wheezes. He has no rales. He exhibits no tenderness.  Neurological: He is alert and oriented to person, place, and time. Coordination abnormal.  Iliopsoas 4/5 Bilateral, Tib  anterior 4/5 bilateral, no ankle clonus, unable to asses heel/toe/sequential walking, sensation intact upper and lower extremities.   Patient is using cane reluctantly  Skin: Skin is warm and dry. No rash noted. He is not diaphoretic.  Psychiatric:  Patient appears slightly disheveled, he is calling Chase Simmons names He seems very reluctant to do anything safely (i.e. using cane)   Assessment & Plan:   Chase Simmons was seen today for follow-up.  Diagnoses and all orders for this visit:  Elevated BUN -     Basic Metabolic Panel (BMET)  Type 2 diabetes mellitus with complication, without long-term current use of insulin (HCC)  Knee pain, bilateral  Non compliance with medical treatment  Essential hypertension  Tremor, essential  HLD (hyperlipidemia)  Other orders -     glimepiride (AMARYL) 4 MG tablet; Take 1 tablet (4 mg total) by mouth daily with breakfast.   I have discontinued Mr. Chase Simmons's glimepiride. I am also having him start on glimepiride. Additionally, I am having him maintain Chase ALPRAZolam, atorvastatin, carvedilol, lisinopril, metFORMIN, PARoxetine, primidone, tiotropium, Cholecalciferol, tiotropium, and tamsulosin.  Meds ordered this encounter  Medications  . Cholecalciferol (VITAMIN D-1000 MAX ST) 1000 units tablet    Sig: Take 1,000 mg by mouth.  . tiotropium (SPIRIVA) 18 MCG inhalation capsule    Sig: Place 1 capsule into inhaler and inhale as needed.  . tamsulosin (FLOMAX) 0.4 MG CAPS capsule    Sig: Take 1 capsule by mouth daily.  Marland Kitchen glimepiride (AMARYL) 4 MG tablet    Sig: Take 1 tablet (4 mg total) by mouth daily with breakfast.    Dispense:  30 tablet    Refill:  1    Order Specific Question:  Supervising Provider    Answer:  Crecencio Mc [[id number]]     Follow-up: Return in about 4 weeks (around 04/13/2015) for Follow up with labs- A1c and Lipid panel .

## 2015-03-16 NOTE — Progress Notes (Signed)
Pre visit review using our clinic review tool, if applicable. No additional management support is needed unless otherwise documented below in the visit note. 

## 2015-03-16 NOTE — Patient Instructions (Addendum)
Tylenol Arthritis over the counter for pain in joints as directed   We will get you set up for physical therapy at home is the goal.   Your Amaryl was upped from 2 mg daily before breakfast to 4 mg daily before breakfast- pick up the new dosage at your pharmacy. Stop the 2 mg and start the 4 mg tomorrow.   Need to see you in 1 month.

## 2015-03-21 DIAGNOSIS — Z91199 Patient's noncompliance with other medical treatment and regimen due to unspecified reason: Secondary | ICD-10-CM | POA: Insufficient documentation

## 2015-03-21 DIAGNOSIS — Z9119 Patient's noncompliance with other medical treatment and regimen: Secondary | ICD-10-CM | POA: Insufficient documentation

## 2015-03-21 NOTE — Assessment & Plan Note (Signed)
Rechecking bmet today because patient's BUN was elevated Creatinine was normal Advised good water intake Upping the Amaryl from 2 mg to 4 mg- would like to change this medication but patient is highly reluctant despite teaching

## 2015-03-21 NOTE — Assessment & Plan Note (Signed)
This is currently stable on primidone

## 2015-03-21 NOTE — Assessment & Plan Note (Addendum)
BP Readings from Last 3 Encounters:  03/16/15 120/78  01/08/15 124/80  04/01/14 130/72   A she is stable at this time continue current regimen Checking bmet today FU 4 weeks

## 2015-03-21 NOTE — Assessment & Plan Note (Signed)
Patient is noncompliant with medical treatment regimens that are suggested for him Patient declines everything as far as referrals, DME, medication changes and reports she will get a second opinion

## 2015-03-21 NOTE — Assessment & Plan Note (Signed)
Patient has had knee pain bilaterally for some time now and reports this is what's causing his weakness in his legs. He is using a cane with reluctance He declines a DME order for rolling walker Patient has trouble getting from a sitting to standing position in the office today and gait is unsteady Advised him to have help with walking Patient adamantly declines multiple times to have PT come to his home for gait training

## 2015-03-21 NOTE — Assessment & Plan Note (Signed)
We'll obtain at next appointment May pull him off of statin

## 2015-04-08 ENCOUNTER — Ambulatory Visit: Payer: Self-pay | Admitting: Nurse Practitioner

## 2015-04-08 ENCOUNTER — Other Ambulatory Visit: Payer: Self-pay | Admitting: Nurse Practitioner

## 2015-04-09 ENCOUNTER — Other Ambulatory Visit: Payer: Self-pay | Admitting: Nurse Practitioner

## 2015-04-11 ENCOUNTER — Other Ambulatory Visit: Payer: Self-pay | Admitting: Nurse Practitioner

## 2015-04-13 ENCOUNTER — Ambulatory Visit (INDEPENDENT_AMBULATORY_CARE_PROVIDER_SITE_OTHER): Payer: Commercial Managed Care - HMO | Admitting: Nurse Practitioner

## 2015-04-13 ENCOUNTER — Encounter: Payer: Self-pay | Admitting: Nurse Practitioner

## 2015-04-13 VITALS — BP 132/82 | HR 56 | Temp 97.7°F | Ht 70.0 in | Wt 239.0 lb

## 2015-04-13 DIAGNOSIS — M25561 Pain in right knee: Secondary | ICD-10-CM

## 2015-04-13 DIAGNOSIS — M25562 Pain in left knee: Secondary | ICD-10-CM

## 2015-04-13 MED ORDER — HYDROCHLOROTHIAZIDE 12.5 MG PO TABS: 12.5000 mg | ORAL_TABLET | Freq: Every day | ORAL | Status: DC

## 2015-04-13 NOTE — Progress Notes (Signed)
Patient ID: Chase Simmons, male    DOB: Nov 02, 1933  Age: 82 y.o. MRN: [id number]  CC: No chief complaint on file.   HPI Chase Simmons presents for follow up of unsteady gait.   1) Left knee is chronically of concern  Sharp pain in left knee recently  Saw Dr. Marry Guan in the past and had injections into the knees previously. Pt is a poor historian- unsure of accuracy  Walking seems the worst for the patient and changing positions.   Daughter is handling medications per pt.   Pt looks unkempt again today, shirt untucked, hair everywhere, sleep in eyes ect...  History Chase Simmons has a past medical history of Cancer [facility]) (pt unsure of year); Diabetes mellitus without complication ([place]); COPD (chronic obstructive pulmonary disease) ([place]); Allergy; Arrhythmia; Hypertension; Hyperlipidemia; Urinary incontinence; UTI (lower urinary tract infection); and Tremor, essential.   He has no past surgical history on file.   His family history includes Arthritis in his mother; Cancer in his maternal aunt and maternal uncle.He reports that he has quit smoking. He has never used smokeless tobacco. He reports that he drinks about 7.2 oz of alcohol per week. He reports that he does not use illicit drugs.  Outpatient Prescriptions Prior to Visit  Medication Sig Dispense Refill  . ALPRAZolam (XANAX) 0.25 MG tablet take 1 tablet by mouth twice a day if needed for anxiety 60 tablet 1  . carvedilol (COREG) 12.5 MG tablet TAKE 1 TABLET  2 (TWO) TIMES DAILY WITH A MEAL. 180 tablet 3  . Cholecalciferol (VITAMIN D-1000 MAX ST) 1000 units tablet Take 1,000 mg by mouth.    Marland Kitchen glimepiride (AMARYL) 4 MG tablet Take 1 tablet (4 mg total) by mouth daily with breakfast. 30 tablet 1  . lisinopril (PRINIVIL,ZESTRIL) 10 MG tablet TAKE 1 TABLET EVERY DAY 90 tablet 3  . metFORMIN (GLUCOPHAGE) 1000 MG tablet TAKE 1 TABLET TWICE DAILY  WITH  FOOD 180 tablet 3  . PARoxetine (PAXIL) 40 MG tablet Take 1 tablet (40 mg total) by mouth  daily. 90 tablet 1  . primidone (MYSOLINE) 50 MG tablet TAKE 1 TABLET TWICE DAILY 180 tablet 3  . tamsulosin (FLOMAX) 0.4 MG CAPS capsule Take 1 capsule by mouth daily.    Marland Kitchen tiotropium (SPIRIVA) 18 MCG inhalation capsule Place 1 capsule (18 mcg total) into inhaler and inhale daily. 30 capsule 5  . atorvastatin (LIPITOR) 80 MG tablet TAKE 1 TABLET ONE TIME DAILY 90 tablet 1  . tiotropium (SPIRIVA) 18 MCG inhalation capsule Place 1 capsule into inhaler and inhale as needed.     No facility-administered medications prior to visit.    ROS Review of Systems  Constitutional: Negative for fever, chills, diaphoresis and fatigue.  Eyes: Negative for visual disturbance.  Respiratory: Negative for chest tightness, shortness of breath and wheezing.   Cardiovascular: Negative for chest pain, palpitations and leg swelling.  Musculoskeletal: Positive for myalgias, joint swelling, arthralgias and gait problem. Negative for back pain, neck pain and neck stiffness.  Neurological: Positive for weakness. Negative for dizziness and numbness.  Psychiatric/Behavioral: Negative for suicidal ideas and sleep disturbance. The patient is not nervous/anxious.     Objective:  BP 132/82 mmHg  Pulse 56  Temp(Src) 97.7 F (36.5 C) (Oral)  Ht 5\' 10"  (1.778 m)  Wt 239 lb (108.41 kg)  BMI 34.29 kg/m2  SpO2 96%  Physical Exam  Constitutional: He is oriented to person, place, and time. He appears well-developed and well-nourished. No distress.  Pt  appears unkempt today  HENT:  Head: Normocephalic and atraumatic.  Right Ear: External ear normal.  Left Ear: External ear normal.  Cardiovascular: Normal rate, regular rhythm and normal heart sounds.   Pulmonary/Chest: Effort normal and breath sounds normal. No respiratory distress. He has no wheezes. He has no rales. He exhibits no tenderness.  Musculoskeletal: Normal range of motion. He exhibits edema and tenderness.  Left knee has good ROM (slow due to pain), Quads  are 4/5 bilaterally, small effusion and crepitus of left knee  Neurological: He is alert and oriented to person, place, and time.  Skin: Skin is warm and dry. No rash noted. He is not diaphoretic.  Psychiatric: He has a normal mood and affect. His behavior is normal. Judgment and thought content normal.   Assessment & Plan:   Diagnoses and all orders for this visit:  Knee pain, bilateral -     Ambulatory referral to Orthopedic Surgery  Other orders -     hydrochlorothiazide (HYDRODIURIL) 12.5 MG tablet; Take 1 tablet (12.5 mg total) by mouth daily.  I am having Chase Simmons start on hydrochlorothiazide. I am also having him maintain his ALPRAZolam, PARoxetine, tiotropium, Cholecalciferol, tamsulosin, glimepiride, metFORMIN, carvedilol, lisinopril, and primidone.  Meds ordered this encounter  Medications  . hydrochlorothiazide (HYDRODIURIL) 12.5 MG tablet    Sig: Take 1 tablet (12.5 mg total) by mouth daily.    Dispense:  30 tablet    Refill:  0    Order Specific Question:  Supervising Provider    Answer:  Crecencio Mc [[id number]]     Follow-up: Return in about 2 weeks (around 04/27/2015) for Follow up on medication.

## 2015-04-13 NOTE — Patient Instructions (Addendum)
See me in 2 weeks.   HCTZ- fluid pill and helps with blood pressure, too!

## 2015-04-13 NOTE — Progress Notes (Signed)
Pre-visit discussion using our clinic review tool. No additional management support is needed unless otherwise documented below in the visit note.  

## 2015-04-19 NOTE — Assessment & Plan Note (Addendum)
See Note from 03/21/15  Update- Left>Right knee pain Not using an assistive device today- slow gait, slightly unsteady- improved from last visit, pt has cane and rolling walker that daughter bought at home Would like to be referred back to Dr. Clydell Hakim office for knee injections BP is pre-hypertensive (okay for 82 yo), but will try HCTZ to reduce fluid on knee- FU in 2 weeks for BMET and BP check

## 2015-04-20 ENCOUNTER — Emergency Department
Admission: EM | Admit: 2015-04-20 | Discharge: 2015-04-20 | Disposition: A | Discharge status: Home / Self Care | Payer: Commercial Managed Care - HMO | Attending: Emergency Medicine | Admitting: Emergency Medicine

## 2015-04-20 ENCOUNTER — Emergency Department: Payer: Commercial Managed Care - HMO

## 2015-04-20 DIAGNOSIS — J449 Chronic obstructive pulmonary disease, unspecified: Secondary | ICD-10-CM | POA: Diagnosis not present

## 2015-04-20 DIAGNOSIS — E785 Hyperlipidemia, unspecified: Secondary | ICD-10-CM | POA: Insufficient documentation

## 2015-04-20 DIAGNOSIS — Z79899 Other long term (current) drug therapy: Secondary | ICD-10-CM | POA: Insufficient documentation

## 2015-04-20 DIAGNOSIS — Z87891 Personal history of nicotine dependence: Secondary | ICD-10-CM | POA: Insufficient documentation

## 2015-04-20 DIAGNOSIS — I10 Essential (primary) hypertension: Secondary | ICD-10-CM | POA: Diagnosis not present

## 2015-04-20 DIAGNOSIS — Z7984 Long term (current) use of oral hypoglycemic drugs: Secondary | ICD-10-CM | POA: Diagnosis not present

## 2015-04-20 DIAGNOSIS — R42 Dizziness and giddiness: Secondary | ICD-10-CM | POA: Diagnosis not present

## 2015-04-20 DIAGNOSIS — E119 Type 2 diabetes mellitus without complications: Secondary | ICD-10-CM | POA: Insufficient documentation

## 2015-04-20 LAB — BASIC METABOLIC PANEL
Anion gap: 5 (ref 5–15)
BUN: 29 mg/dL — AB (ref 6–20)
CHLORIDE: 103 mmol/L (ref 101–111)
CO2: 27 mmol/L (ref 22–32)
CREATININE: 1.05 mg/dL (ref 0.61–1.24)
Calcium: 9.1 mg/dL (ref 8.9–10.3)
GFR calc non Af Amer: >60 mL/min (ref >60)
GLUCOSE: 234 mg/dL — AB (ref 65–99)
Potassium: 4.4 mmol/L (ref 3.5–5.1)
Sodium: 135 mmol/L (ref 135–145)

## 2015-04-20 LAB — CBC
HCT: 39.1 % — ABNORMAL LOW (ref 40.0–52.0)
Hemoglobin: 13.5 g/dL (ref 13.0–18.0)
MCH: 34.5 pg — AB (ref 26.0–34.0)
MCHC: 34.5 g/dL (ref 32.0–36.0)
MCV: 100 fL (ref 80.0–100.0)
PLATELETS: 171 10*3/uL (ref 150–440)
RBC: 3.91 MIL/uL — AB (ref 4.40–5.90)
RDW: 13 % (ref 11.5–14.5)
WBC: 7.1 10*3/uL (ref 3.8–10.6)

## 2015-04-20 MED ORDER — ONDANSETRON 4 MG PO TBDP
ORAL_TABLET | ORAL | Status: AC
  Administered 2015-04-20: 4 mg via ORAL
  Filled 2015-04-20: qty 1

## 2015-04-20 MED ORDER — MECLIZINE HCL 25 MG PO TABS
25.0000 mg | ORAL_TABLET | Freq: Once | ORAL | Status: AC
  Administered 2015-04-20: 25 mg via ORAL
  Filled 2015-04-20: qty 1

## 2015-04-20 MED ORDER — MECLIZINE HCL 25 MG PO TABS: 25.0000 mg | ORAL_TABLET | Freq: Three times a day (TID) | ORAL | Status: DC | PRN

## 2015-04-20 MED ORDER — ONDANSETRON 4 MG PO TBDP
4.0000 mg | ORAL_TABLET | Freq: Once | ORAL | Status: AC
  Administered 2015-04-20: 4 mg via ORAL

## 2015-04-20 MED ORDER — ONDANSETRON HCL 4 MG PO TABS: 4.0000 mg | ORAL_TABLET | Freq: Three times a day (TID) | ORAL | Status: DC | PRN

## 2015-04-20 MED ORDER — SODIUM CHLORIDE 0.9 % IV BOLUS (SEPSIS)
500.0000 mL | Freq: Once | INTRAVENOUS | Status: AC
Start: 2015-04-20 — End: 2015-04-20
  Administered 2015-04-20: 500 mL via INTRAVENOUS

## 2015-04-20 NOTE — ED Notes (Addendum)
Pt bib EMS w/ c/o nausea and dizziness.  Per EMS, pt had diarrhea this AM.  Pt sts that this afternoon he had an episode of dizziness and nausea.  Pt denies LOC/syncope, head injury or CP.  Pt A/Ox4.  Pt dry heaving when taking temperature and given Zofran ODT.  Pt denies weakness.  Per EMS pt to start HTZ medication for HTN this week.

## 2015-04-20 NOTE — ED Notes (Signed)
Attempted to call family x3 at [phone] without answer. Pt we'll call EMS for transport.

## 2015-04-20 NOTE — ED Notes (Signed)
Pt's arrived she will gives pt a ride home

## 2015-04-20 NOTE — ED Provider Notes (Signed)
Time Seen: Approximately 1600  I have reviewed the triage notes  Chief Complaint: Nausea and Dizziness   History of Present Illness: Chase Simmons is a 82 y.o. male who states that he woke up from a nap today with the onset suddenly of feeling dizzy. He describes room is spinning. He had a lot of nausea and vomiting initially. He denies any headaches or visual disturbances denies any blind spots in the visual field axis. Denies any weakness in either upper or lower extremities. Patient denies any trouble with speech or swallowing. He states he is actually feeling better now during my history and physical exam he states that instead of objects pain and they seem to be moving back and forth. Then Zofran for nausea which she states she's not had any nausea currently. He denies any hematemesis or biliary emesis. He states he's had some recent seasonal allergies and is also had an adjustment on his blood pressure medication this week.  Past Medical History  Diagnosis Date  . Cancer [facility]) pt unsure of year    bladder cancer-Dr. Jacqlyn Larsen  . Diabetes mellitus without complication ([place])   . COPD (chronic obstructive pulmonary disease) ([place])   . Allergy   . Arrhythmia   . Hypertension   . Hyperlipidemia   . Urinary incontinence   . UTI (lower urinary tract infection)   . Tremor, essential     sees [facility] neurologist    Patient Active Problem List   Diagnosis Date Noted  . Non compliance with medical treatment 03/21/2015  . Knee pain, bilateral 03/16/2015  . Anxiety state 04/05/2014  . Atypical nevi 04/05/2014  . Chronic diarrhea 03/04/2013  . Tremor, essential 10/22/2012  . HLD (hyperlipidemia) 10/22/2012  . HTN (hypertension) 08/01/2012  . DM (diabetes mellitus), type 2 ([place]) 08/01/2012  . Low testosterone 08/01/2012    History reviewed. No pertinent past surgical history.  History reviewed. No pertinent past surgical history.  Current Outpatient Rx  Name  Route  Sig  Dispense   Refill  . ALPRAZolam (XANAX) 0.25 MG tablet      take 1 tablet by mouth twice a day if needed for anxiety   60 tablet   1   . atorvastatin (LIPITOR) 80 MG tablet      TAKE 1 TABLET ONE TIME DAILY   90 tablet   2   . carvedilol (COREG) 12.5 MG tablet      TAKE 1 TABLET  2 (TWO) TIMES DAILY WITH A MEAL.   180 tablet   3   . Cholecalciferol (VITAMIN D-1000 MAX ST) 1000 units tablet   Oral   Take 1,000 mg by mouth.         Marland Kitchen glimepiride (AMARYL) 4 MG tablet   Oral   Take 1 tablet (4 mg total) by mouth daily with breakfast.   30 tablet   1   . hydrochlorothiazide (HYDRODIURIL) 12.5 MG tablet   Oral   Take 1 tablet (12.5 mg total) by mouth daily.   30 tablet   0   . lisinopril (PRINIVIL,ZESTRIL) 10 MG tablet      TAKE 1 TABLET EVERY DAY   90 tablet   3   . meclizine (ANTIVERT) 25 MG tablet   Oral   Take 1 tablet (25 mg total) by mouth 3 (three) times daily as needed for dizziness or nausea.   21 tablet   0   . metFORMIN (GLUCOPHAGE) 1000 MG tablet      TAKE  1 TABLET TWICE DAILY  WITH  FOOD   180 tablet   3   . ondansetron (ZOFRAN) 4 MG tablet   Oral   Take 1 tablet (4 mg total) by mouth every 8 (eight) hours as needed for nausea or vomiting.   21 tablet   0   . PARoxetine (PAXIL) 40 MG tablet   Oral   Take 1 tablet (40 mg total) by mouth daily.   90 tablet   1   . primidone (MYSOLINE) 50 MG tablet      TAKE 1 TABLET TWICE DAILY   180 tablet   3   . tamsulosin (FLOMAX) 0.4 MG CAPS capsule   Oral   Take 1 capsule by mouth daily.         Marland Kitchen tiotropium (SPIRIVA) 18 MCG inhalation capsule   Inhalation   Place 1 capsule (18 mcg total) into inhaler and inhale daily.   30 capsule   5     Allergies:  Review of patient's allergies indicates no known allergies.  Family History: Family History  Problem Relation Age of Onset  . Arthritis Mother   . Cancer Maternal Aunt     breast cancer  . Cancer Maternal Uncle     prostate    Social  History: Social History  Substance Use Topics  . Smoking status: Former Smoker -- 1.50 packs/day for 40 years  . Smokeless tobacco: Never Used  . Alcohol Use: 7.2 oz/week    12 Standard drinks or equivalent per week     Comment: 1-2 drinks daily     Review of Systems:   10 point review of systems was performed and was otherwise negative:  Constitutional: No fever Eyes: No visual disturbances ENT: No sore throat, ear pain Cardiac: No chest pain Respiratory: No shortness of breath, wheezing, or stridor Abdomen: No abdominal pain, no vomiting, No diarrhea Endocrine: No weight loss, No night sweats Extremities: No peripheral edema, cyanosis Skin: No rashes, easy bruising Neurologic: No focal weakness, trouble with speech or swollowing Urologic: No dysuria, Hematuria, or urinary frequency    Physical Exam:  ED Triage Vitals  Enc Vitals Group     BP 04/20/15 1533 203/82 mmHg     Pulse Rate 04/20/15 1533 89     Resp 04/20/15 1533 16     Temp 04/20/15 1533 97.6 F (36.4 C)     Temp Source 04/20/15 1533 Oral     SpO2 04/20/15 1533 98 %     Weight 04/20/15 1533 235 lb (106.595 kg)     Height --      Head Cir --      Peak Flow --      Pain Score 04/20/15 1534 0     Pain Loc --      Pain Edu? --      Excl. in [place]? --     General: Awake , Alert , and Oriented times 3; GCS 15 Head: Normal cephalic , atraumatic Eyes: Pupils equal , round, reactive to light. Extraocular eye movements are intact though the patient does have some rotational nystagmus with gaze from left to right. Nose/Throat: No nasal drainage, patent upper airway without erythema or exudate.  Neck: Supple, Full range of motion, No anterior adenopathy or palpable thyroid masses Lungs: Clear to ascultation without wheezes , rhonchi, or rales Heart: Regular rate, regular rhythm without murmurs , gallops , or rubs Abdomen: Soft, non tender without rebound, guarding , or rigidity; bowel sounds positive and  symmetric  in all 4 quadrants. No organomegaly .        Extremities: 2 plus symmetric pulses. No edema, clubbing or cyanosis Neurologic: Baseline chronic resting tremor. normal ambulation, Motor symmetric without deficits, sensory intact Skin: warm, dry, no rashes   Labs:   All laboratory work was reviewed including any pertinent negatives or positives listed below:  Labs Reviewed  BASIC METABOLIC PANEL - Abnormal; Notable for the following:    Glucose, Bld 234 (*)    BUN 29 (*)    All other components within normal limits  CBC - Abnormal; Notable for the following:    RBC 3.91 (*)    HCT 39.1 (*)    MCH 34.5 (*)    All other components within normal limits  CBG MONITORING, ED    EKG: * ED ECG REPORT I, Daymon Larsen, the attending physician, personally viewed and interpreted this ECG.  Date: 04/20/2015 EKG Time: 1617 Rate: 50 Rhythm: normal sinus rhythm. First-degree AV block QRS Axis: normal Intervals: normal ST/T Wave abnormalities: Nonspecific T wave abnormality Conduction Disturbances: none Narrative Interpretation: unremarkable    Radiology:   EXAM: CT HEAD WITHOUT CONTRAST  TECHNIQUE: Contiguous axial images were obtained from the base of the skull through the vertex without intravenous contrast.  COMPARISON: 07/17/2010  FINDINGS: Suspected small lacunar infarct in the left external capsule, stable and chronic. Periventricular white matter and corona radiata hypodensities favor chronic ischemic microvascular white matter disease. Otherwise, the brainstem, cerebellum, cerebral peduncles, thalami, basal ganglia, basilar cisterns, and ventricular system appear within normal limits. No intracranial hemorrhage, mass lesion, or acute CVA.  IMPRESSION: 1. No acute intracranial findings. 2. Periventricular white matter and corona radiata hypodensities favor chronic ischemic microvascular white matter disease. 3. Suspected small left external capsule lacunar  infarct, stable and chronic.   I personally reviewed the radiologic studies     ED Course:  Patient is otherwise hemodynamically stable and he was given Zofran on arrival and then I added some Antivert. His presentation appears to be consistent with exhaustive vertigo such as benign positional vertigo. He states he feels fine at present and denies any visual disturbances or nausea or vomiting. I felt this was unlikely to be central vertigo on his CAT scan does not show any acute abnormality is seen and he has no other new focal neurologic deficits. He is slightly bradycardic but has no has any kind of other significant arrhythmia at this time.*    Assessment:  Benign positional vertigo   Final Clinical Impression:   Final diagnoses:  Vertigo     Plan:  Outpatient management Patient was advised to return immediately if condition worsens. Patient was advised to follow up with their primary care physician or other specialized physicians involved in their outpatient care. The patient and/or family member/power of attorney had laboratory results reviewed at the bedside. All questions and concerns were addressed and appropriate discharge instructions were distributed by the nursing staff.             Daymon Larsen, MD 04/20/15 [phone]

## 2015-04-20 NOTE — Discharge Instructions (Signed)
Benign Positional Vertigo °Vertigo is the feeling that you or your surroundings are moving when they are not. Benign positional vertigo is the most common form of vertigo. The cause of this condition is not serious (is benign). This condition is triggered by certain movements and positions (is positional). This condition can be dangerous if it occurs while you are doing something that could endanger you or others, such as driving.  °CAUSES °In many cases, the cause of this condition is not known. It may be caused by a disturbance in an area of the inner ear that helps your brain to sense movement and balance. This disturbance can be caused by a viral infection (labyrinthitis), head injury, or repetitive motion. °RISK FACTORS °This condition is more likely to develop in: °· Women. °· People who are 50 years of age or older. °SYMPTOMS °Symptoms of this condition usually happen when you move your head or your eyes in different directions. Symptoms may start suddenly, and they usually last for less than a minute. Symptoms may include: °· Loss of balance and falling. °· Feeling like you are spinning or moving. °· Feeling like your surroundings are spinning or moving. °· Nausea and vomiting. °· Blurred vision. °· Dizziness. °· Involuntary eye movement (nystagmus). °Symptoms can be mild and cause only slight annoyance, or they can be severe and interfere with daily life. Episodes of benign positional vertigo may return (recur) over time, and they may be triggered by certain movements. Symptoms may improve over time. °DIAGNOSIS °This condition is usually diagnosed by medical history and a physical exam of the head, neck, and ears. You may be referred to a health care provider who specializes in ear, nose, and throat (ENT) problems (otolaryngologist) or a provider who specializes in disorders of the nervous system (neurologist). You may have additional testing, including: °· MRI. °· A CT scan. °· Eye movement tests. Your  health care provider may ask you to change positions quickly while he or she watches you for symptoms of benign positional vertigo, such as nystagmus. Eye movement may be tested with an electronystagmogram (ENG), caloric stimulation, the Dix-Hallpike test, or the roll test. °· An electroencephalogram (EEG). This records electrical activity in your brain. °· Hearing tests. °TREATMENT °Usually, your health care provider will treat this by moving your head in specific positions to adjust your inner ear back to normal. Surgery may be needed in severe cases, but this is rare. In some cases, benign positional vertigo may resolve on its own in 2-4 weeks. °HOME CARE INSTRUCTIONS °Safety °· Move slowly. Avoid sudden body or head movements. °· Avoid driving. °· Avoid operating heavy machinery. °· Avoid doing any tasks that would be dangerous to you or others if a vertigo episode would occur. °· If you have trouble walking or keeping your balance, try using a cane for stability. If you feel dizzy or unstable, sit down right away. °· Return to your normal activities as told by your health care provider. Ask your health care provider what activities are safe for you. °General Instructions °· Take over-the-counter and prescription medicines only as told by your health care provider. °· Avoid certain positions or movements as told by your health care provider. °· Drink enough fluid to keep your urine clear or pale yellow. °· Keep all follow-up visits as told by your health care provider. This is important. °SEEK MEDICAL CARE IF: °· You have a fever. °· Your condition gets worse or you develop new symptoms. °· Your family or friends   notice any behavioral changes.  Your nausea or vomiting gets worse.  You have numbness or a "pins and needles" sensation. SEEK IMMEDIATE MEDICAL CARE IF:  You have difficulty speaking or moving.  You are always dizzy.  You faint.  You develop severe headaches.  You have weakness in your  legs or arms.  You have changes in your hearing or vision.  You develop a stiff neck.  You develop sensitivity to light.   This information is not intended to replace advice given to you by your health care provider. Make sure you discuss any questions you have with your health care provider.   Document Released: 09/27/2005 Document Revised: 09/10/2014 Document Reviewed: 04/14/2014 Elsevier Interactive Patient Education [employer].   Please return immediately if condition worsens. Please contact her primary physician or the physician you were given for referral. If you have any specialist physicians involved in her treatment and plan please also contact them. Thank you for using [facility].

## 2015-04-20 NOTE — ED Notes (Signed)
Pt not safe to go home by self.

## 2015-04-30 ENCOUNTER — Encounter: Payer: Self-pay | Admitting: Nurse Practitioner

## 2015-04-30 ENCOUNTER — Ambulatory Visit: Payer: Self-pay | Admitting: Nurse Practitioner

## 2015-05-14 ENCOUNTER — Ambulatory Visit (INDEPENDENT_AMBULATORY_CARE_PROVIDER_SITE_OTHER): Payer: Commercial Managed Care - HMO | Admitting: Nurse Practitioner

## 2015-05-14 ENCOUNTER — Encounter: Payer: Self-pay | Admitting: Nurse Practitioner

## 2015-05-14 VITALS — BP 104/68 | HR 54 | Temp 98.2°F | Resp 16 | Ht 70.0 in | Wt 238.8 lb

## 2015-05-14 DIAGNOSIS — J309 Allergic rhinitis, unspecified: Secondary | ICD-10-CM | POA: Insufficient documentation

## 2015-05-14 DIAGNOSIS — E118 Type 2 diabetes mellitus with unspecified complications: Secondary | ICD-10-CM

## 2015-05-14 DIAGNOSIS — G479 Sleep disorder, unspecified: Secondary | ICD-10-CM

## 2015-05-14 DIAGNOSIS — J302 Other seasonal allergic rhinitis: Secondary | ICD-10-CM | POA: Diagnosis not present

## 2015-05-14 DIAGNOSIS — M25562 Pain in left knee: Secondary | ICD-10-CM

## 2015-05-14 DIAGNOSIS — M25561 Pain in right knee: Secondary | ICD-10-CM

## 2015-05-14 LAB — COMPREHENSIVE METABOLIC PANEL
ALBUMIN: 3.9 g/dL (ref 3.5–5.2)
ALK PHOS: 63 U/L (ref 39–117)
ALT: 22 U/L (ref 0–53)
AST: 21 U/L (ref 0–37)
BUN: 30 mg/dL — ABNORMAL HIGH (ref 6–23)
CO2: 26 mEq/L (ref 19–32)
Calcium: 9.5 mg/dL (ref 8.4–10.5)
Chloride: 103 mEq/L (ref 96–112)
Creatinine, Ser: 1.17 mg/dL (ref 0.40–1.50)
GFR: 63.71 mL/min (ref >60.00)
GLUCOSE: 144 mg/dL — AB (ref 70–99)
POTASSIUM: 4.3 meq/L (ref 3.5–5.1)
Sodium: 138 mEq/L (ref 135–145)
TOTAL PROTEIN: 6.8 g/dL (ref 6.0–8.3)
Total Bilirubin: 0.5 mg/dL (ref 0.2–1.2)

## 2015-05-14 LAB — HEMOGLOBIN A1C: HEMOGLOBIN A1C: 7.2 % — AB (ref 4.6–6.5)

## 2015-05-14 MED ORDER — BECLOMETHASONE DIPROPIONATE 80 MCG/ACT NA AERS: INHALATION_SPRAY | NASAL | Status: DC

## 2015-05-14 NOTE — Assessment & Plan Note (Signed)
Pt is fairly non-compliant with regimen and eats without regard to carbs.  CMET, A1c and Urine microalbumin checked today

## 2015-05-14 NOTE — Assessment & Plan Note (Signed)
Pt has had sleep disturbance per son.  Melatonin 5 mg OTC before "bedtime" to help sleep-wake cycle

## 2015-05-14 NOTE — Assessment & Plan Note (Signed)
Bilateral knee pain and intermittent weakness of legs bilaterally Pt seeing Dr. Marry Guan at the end of the month

## 2015-05-14 NOTE — Progress Notes (Signed)
Patient ID: TRENITY BASURTO, male    DOB: September 27, 1933  Age: 82 y.o. MRN: [id number]  CC: Diabetes   HPI TADHG SUNTKEN presents for follow up of DM type II, knee pain, hearing loss and CC of sleep disturbance and allergies.  1) Knee pain- Seeing Doctor Hooten  Legs bilaterally feel heavy and "creeps" around Has 18 steps up to his apt, going up several times daily  Pt reports it is intermittent- pt is a poor historian and is unable to give me much information regarding this.   2) DM- Eating sandwiches and eating out when he can.  3) Hearing loss of right ear. Difficulty hearing tv and understanding the words. Reports seeing an audiologist and having testing. Declines further testing.   4) Allergies- rhinorrhea Taking allergy OTC medications- loratadine   5) Son came back and asked me for help regarding patient unable to sleep at night and sleeping some during the day. Discussed melatonin 1 hr prior to sleep time.   History Lamar has a past medical history of Cancer [facility]) (pt unsure of year); Diabetes mellitus without complication ([place]); COPD (chronic obstructive pulmonary disease) ([place]); Allergy; Arrhythmia; Hypertension; Hyperlipidemia; Urinary incontinence; UTI (lower urinary tract infection); and Tremor, essential.   He has no past surgical history on file.   His family history includes Arthritis in his mother; Cancer in his maternal aunt and maternal uncle.He reports that he has quit smoking. He has never used smokeless tobacco. He reports that he drinks about 7.2 oz of alcohol per week. He reports that he does not use illicit drugs.  Outpatient Prescriptions Prior to Visit  Medication Sig Dispense Refill  . ALPRAZolam (XANAX) 0.25 MG tablet take 1 tablet by mouth twice a day if needed for anxiety 60 tablet 1  . atorvastatin (LIPITOR) 80 MG tablet TAKE 1 TABLET ONE TIME DAILY 90 tablet 2  . carvedilol (COREG) 12.5 MG tablet TAKE 1 TABLET  2 (TWO) TIMES DAILY WITH A MEAL. 180 tablet  3  . Cholecalciferol (VITAMIN D-1000 MAX ST) 1000 units tablet Take 1,000 mg by mouth.    Marland Kitchen glimepiride (AMARYL) 4 MG tablet Take 1 tablet (4 mg total) by mouth daily with breakfast. 30 tablet 1  . hydrochlorothiazide (HYDRODIURIL) 12.5 MG tablet Take 1 tablet (12.5 mg total) by mouth daily. 30 tablet 0  . lisinopril (PRINIVIL,ZESTRIL) 10 MG tablet TAKE 1 TABLET EVERY DAY 90 tablet 3  . meclizine (ANTIVERT) 25 MG tablet Take 1 tablet (25 mg total) by mouth 3 (three) times daily as needed for dizziness or nausea. 21 tablet 0  . metFORMIN (GLUCOPHAGE) 1000 MG tablet TAKE 1 TABLET TWICE DAILY  WITH  FOOD 180 tablet 3  . ondansetron (ZOFRAN) 4 MG tablet Take 1 tablet (4 mg total) by mouth every 8 (eight) hours as needed for nausea or vomiting. 21 tablet 0  . PARoxetine (PAXIL) 40 MG tablet Take 1 tablet (40 mg total) by mouth daily. 90 tablet 1  . primidone (MYSOLINE) 50 MG tablet TAKE 1 TABLET TWICE DAILY 180 tablet 3  . tamsulosin (FLOMAX) 0.4 MG CAPS capsule Take 1 capsule by mouth daily.    Marland Kitchen tiotropium (SPIRIVA) 18 MCG inhalation capsule Place 1 capsule (18 mcg total) into inhaler and inhale daily. 30 capsule 5   No facility-administered medications prior to visit.    ROS Review of Systems  Constitutional: Negative for fever, chills, diaphoresis and fatigue.  HENT: Positive for congestion, hearing loss and rhinorrhea. Negative for ear pain,  postnasal drip, sinus pressure, sneezing and sore throat.        Right side  Eyes: Negative for visual disturbance.  Respiratory: Negative for chest tightness, shortness of breath and wheezing.   Cardiovascular: Negative for chest pain, palpitations and leg swelling.  Gastrointestinal: Negative for nausea, vomiting and diarrhea.  Endocrine: Negative for polydipsia, polyphagia and polyuria.  Skin: Negative for rash.  Neurological: Positive for weakness. Negative for dizziness, facial asymmetry, numbness and headaches.       Bilateral legs   Psychiatric/Behavioral: Positive for sleep disturbance. The patient is not nervous/anxious.     Objective:  BP 104/68 mmHg  Pulse 54  Temp(Src) 98.2 F (36.8 C) (Oral)  Resp 16  Ht 5\' 10"  (1.778 m)  Wt 238 lb 12.8 oz (108.319 kg)  BMI 34.26 kg/m2  SpO2 95%  Physical Exam  Constitutional: He is oriented to person, place, and time. He appears well-developed and well-nourished. No distress.  HENT:  Head: Normocephalic and atraumatic.  Right Ear: External ear normal.  Left Ear: External ear normal.  Cardiovascular: Normal rate, regular rhythm and normal heart sounds.   Pulmonary/Chest: Effort normal and breath sounds normal. No respiratory distress. He has no wheezes. He has no rales. He exhibits no tenderness.  Neurological: He is alert and oriented to person, place, and time. No cranial nerve deficit. He exhibits normal muscle tone. Coordination abnormal.  Pt has difficulty with ambulation- declines to use can or walker, but has both. Appears to be from knee pain. Iliopsoas 5/5 bilaterally  Skin: Skin is warm and dry. No rash noted. He is not diaphoretic.  Psychiatric: He has a normal mood and affect. His behavior is normal. Judgment and thought content normal.   Assessment & Plan:   Astro was seen today for diabetes.  Diagnoses and all orders for this visit:  Type 2 diabetes mellitus with complication, without long-term current use of insulin (HCC) -     Hemoglobin A1c -     Comprehensive metabolic panel -     Urine Microalbumin w/creat. ratio  Knee pain, bilateral  Other seasonal allergic rhinitis  Sleep disturbance  Other orders -     Beclomethasone Dipropionate 80 MCG/ACT AERS; Two sprays in each nostril once daily   I am having Mr. Scheel start on Beclomethasone Dipropionate. I am also having him maintain his ALPRAZolam, PARoxetine, tiotropium, Cholecalciferol, tamsulosin, glimepiride, metFORMIN, carvedilol, lisinopril, primidone, atorvastatin,  hydrochlorothiazide, ondansetron, and meclizine.  Meds ordered this encounter  Medications  . Beclomethasone Dipropionate 80 MCG/ACT AERS    Sig: Two sprays in each nostril once daily    Dispense:  1 Inhaler    Refill:  1    Order Specific Question:  Supervising Provider    Answer:  Crecencio Mc [[id number]]     Follow-up: Return if symptoms worsen or fail to improve.

## 2015-05-14 NOTE — Patient Instructions (Addendum)
Please visit the lab today.   QNasl- two sprays in each nostril once daily for allergies and ear echo

## 2015-05-14 NOTE — Assessment & Plan Note (Addendum)
Worsening allergies on Loratadine  QNasl sent to pharmacy May help with echoing sensation in ear since this is recent and believed to be allergy driven. Pt declines seeing audiology again

## 2015-06-04 ENCOUNTER — Emergency Department: Payer: Commercial Managed Care - HMO

## 2015-06-04 ENCOUNTER — Encounter: Payer: Self-pay | Admitting: Emergency Medicine

## 2015-06-04 ENCOUNTER — Inpatient Hospital Stay
Admission: EM | Admit: 2015-06-04 | Discharge: 2015-06-08 | DRG: 439 | Disposition: A | Discharge status: Skilled Nursing Facility | Payer: Commercial Managed Care - HMO | Attending: Internal Medicine | Admitting: Internal Medicine

## 2015-06-04 DIAGNOSIS — E119 Type 2 diabetes mellitus without complications: Secondary | ICD-10-CM | POA: Diagnosis present

## 2015-06-04 DIAGNOSIS — J441 Chronic obstructive pulmonary disease with (acute) exacerbation: Secondary | ICD-10-CM | POA: Diagnosis present

## 2015-06-04 DIAGNOSIS — Z9849 Cataract extraction status, unspecified eye: Secondary | ICD-10-CM | POA: Diagnosis not present

## 2015-06-04 DIAGNOSIS — Z87891 Personal history of nicotine dependence: Secondary | ICD-10-CM

## 2015-06-04 DIAGNOSIS — R1033 Periumbilical pain: Secondary | ICD-10-CM | POA: Diagnosis not present

## 2015-06-04 DIAGNOSIS — Z8551 Personal history of malignant neoplasm of bladder: Secondary | ICD-10-CM

## 2015-06-04 DIAGNOSIS — E785 Hyperlipidemia, unspecified: Secondary | ICD-10-CM | POA: Diagnosis present

## 2015-06-04 DIAGNOSIS — Z794 Long term (current) use of insulin: Secondary | ICD-10-CM

## 2015-06-04 DIAGNOSIS — F329 Major depressive disorder, single episode, unspecified: Secondary | ICD-10-CM | POA: Diagnosis present

## 2015-06-04 DIAGNOSIS — R32 Unspecified urinary incontinence: Secondary | ICD-10-CM | POA: Diagnosis present

## 2015-06-04 DIAGNOSIS — N179 Acute kidney failure, unspecified: Secondary | ICD-10-CM | POA: Diagnosis present

## 2015-06-04 DIAGNOSIS — Z9889 Other specified postprocedural states: Secondary | ICD-10-CM

## 2015-06-04 DIAGNOSIS — Z91040 Latex allergy status: Secondary | ICD-10-CM | POA: Diagnosis not present

## 2015-06-04 DIAGNOSIS — Z8042 Family history of malignant neoplasm of prostate: Secondary | ICD-10-CM

## 2015-06-04 DIAGNOSIS — K859 Acute pancreatitis without necrosis or infection, unspecified: Secondary | ICD-10-CM | POA: Diagnosis not present

## 2015-06-04 DIAGNOSIS — R7989 Other specified abnormal findings of blood chemistry: Secondary | ICD-10-CM | POA: Diagnosis present

## 2015-06-04 DIAGNOSIS — R109 Unspecified abdominal pain: Secondary | ICD-10-CM

## 2015-06-04 DIAGNOSIS — Z8261 Family history of arthritis: Secondary | ICD-10-CM | POA: Diagnosis not present

## 2015-06-04 DIAGNOSIS — I10 Essential (primary) hypertension: Secondary | ICD-10-CM | POA: Diagnosis present

## 2015-06-04 DIAGNOSIS — K858 Other acute pancreatitis without necrosis or infection: Secondary | ICD-10-CM | POA: Diagnosis not present

## 2015-06-04 DIAGNOSIS — K851 Biliary acute pancreatitis without necrosis or infection: Secondary | ICD-10-CM | POA: Diagnosis present

## 2015-06-04 DIAGNOSIS — Z803 Family history of malignant neoplasm of breast: Secondary | ICD-10-CM | POA: Diagnosis not present

## 2015-06-04 DIAGNOSIS — Z79899 Other long term (current) drug therapy: Secondary | ICD-10-CM | POA: Diagnosis not present

## 2015-06-04 LAB — URINALYSIS COMPLETE WITH MICROSCOPIC (ARMC ONLY)
BILIRUBIN URINE: NEGATIVE
Bacteria, UA: NONE SEEN
Glucose, UA: NEGATIVE mg/dL
KETONES UR: NEGATIVE mg/dL
Leukocytes, UA: NEGATIVE
NITRITE: NEGATIVE
PROTEIN: NEGATIVE mg/dL
SPECIFIC GRAVITY, URINE: 1.03 (ref 1.005–1.030)
pH: 5 (ref 5.0–8.0)

## 2015-06-04 LAB — COMPREHENSIVE METABOLIC PANEL
ALK PHOS: 75 U/L (ref 38–126)
ALT: 275 U/L — AB (ref 17–63)
ANION GAP: 7 (ref 5–15)
AST: 196 U/L — ABNORMAL HIGH (ref 15–41)
Albumin: 3.5 g/dL (ref 3.5–5.0)
BILIRUBIN TOTAL: 1.1 mg/dL (ref 0.3–1.2)
BUN: 30 mg/dL — ABNORMAL HIGH (ref 6–20)
CALCIUM: 8.9 mg/dL (ref 8.9–10.3)
CO2: 27 mmol/L (ref 22–32)
CREATININE: 1.43 mg/dL — AB (ref 0.61–1.24)
Chloride: 101 mmol/L (ref 101–111)
GFR calc non Af Amer: 45 mL/min — ABNORMAL LOW (ref >60)
GFR, EST AFRICAN AMERICAN: 52 mL/min — AB (ref >60)
GLUCOSE: 191 mg/dL — AB (ref 65–99)
Potassium: 4.3 mmol/L (ref 3.5–5.1)
Sodium: 135 mmol/L (ref 135–145)
TOTAL PROTEIN: 6.6 g/dL (ref 6.5–8.1)

## 2015-06-04 LAB — CBC
HCT: 39.1 % — ABNORMAL LOW (ref 40.0–52.0)
HEMOGLOBIN: 13.2 g/dL (ref 13.0–18.0)
MCH: 33.3 pg (ref 26.0–34.0)
MCHC: 33.6 g/dL (ref 32.0–36.0)
MCV: 99 fL (ref 80.0–100.0)
PLATELETS: 168 10*3/uL (ref 150–440)
RBC: 3.95 MIL/uL — AB (ref 4.40–5.90)
RDW: 13 % (ref 11.5–14.5)
WBC: 14.1 10*3/uL — ABNORMAL HIGH (ref 3.8–10.6)

## 2015-06-04 LAB — LIPASE, BLOOD: Lipase: 177 U/L — ABNORMAL HIGH (ref 11–51)

## 2015-06-04 LAB — TROPONIN I: Troponin I: 0.06 ng/mL — ABNORMAL HIGH (ref <0.031)

## 2015-06-04 LAB — TRIGLYCERIDES: TRIGLYCERIDES: 54 mg/dL (ref <150)

## 2015-06-04 MED ORDER — SODIUM CHLORIDE 0.9 % IV SOLN
INTRAVENOUS | Status: DC
  Administered 2015-06-04 – 2015-06-07 (×5): via INTRAVENOUS

## 2015-06-04 MED ORDER — PAROXETINE HCL 20 MG PO TABS
40.0000 mg | ORAL_TABLET | Freq: Every day | ORAL | Status: DC
  Administered 2015-06-05 – 2015-06-08 (×4): 40 mg via ORAL
  Filled 2015-06-04 (×4): qty 2

## 2015-06-04 MED ORDER — IOPAMIDOL (ISOVUE-300) INJECTION 61%
75.0000 mL | Freq: Once | INTRAVENOUS | Status: AC | PRN
  Administered 2015-06-04: 75 mL via INTRAVENOUS

## 2015-06-04 MED ORDER — PIPERACILLIN-TAZOBACTAM 3.375 G IVPB
3.3750 g | Freq: Once | INTRAVENOUS | Status: AC
  Administered 2015-06-04: 3.375 g via INTRAVENOUS
  Filled 2015-06-04: qty 50

## 2015-06-04 MED ORDER — OXYCODONE HCL 5 MG PO TABS
5.0000 mg | ORAL_TABLET | ORAL | Status: DC | PRN
  Administered 2015-06-05 – 2015-06-07 (×6): 5 mg via ORAL
  Filled 2015-06-04 (×6): qty 1

## 2015-06-04 MED ORDER — MORPHINE SULFATE (PF) 2 MG/ML IV SOLN
2.0000 mg | INTRAVENOUS | Status: DC | PRN
  Administered 2015-06-06: 2 mg via INTRAVENOUS
  Filled 2015-06-04: qty 1

## 2015-06-04 MED ORDER — LISINOPRIL 10 MG PO TABS
10.0000 mg | ORAL_TABLET | Freq: Every day | ORAL | Status: DC
  Administered 2015-06-05 – 2015-06-08 (×4): 10 mg via ORAL
  Filled 2015-06-04 (×4): qty 1

## 2015-06-04 MED ORDER — SODIUM CHLORIDE 0.9 % IV SOLN
Freq: Once | INTRAVENOUS | Status: AC
  Administered 2015-06-04: 15:00:00 via INTRAVENOUS

## 2015-06-04 MED ORDER — CARVEDILOL 12.5 MG PO TABS
12.5000 mg | ORAL_TABLET | Freq: Two times a day (BID) | ORAL | Status: DC
  Administered 2015-06-05 – 2015-06-08 (×7): 12.5 mg via ORAL
  Filled 2015-06-04 (×7): qty 1

## 2015-06-04 MED ORDER — LORATADINE 10 MG PO TABS
10.0000 mg | ORAL_TABLET | Freq: Every day | ORAL | Status: DC
  Administered 2015-06-05 – 2015-06-08 (×4): 10 mg via ORAL
  Filled 2015-06-04 (×4): qty 1

## 2015-06-04 MED ORDER — MORPHINE SULFATE (PF) 4 MG/ML IV SOLN
4.0000 mg | Freq: Once | INTRAVENOUS | Status: AC
  Administered 2015-06-04: 4 mg via INTRAVENOUS
  Filled 2015-06-04: qty 1

## 2015-06-04 MED ORDER — TIOTROPIUM BROMIDE MONOHYDRATE 18 MCG IN CAPS
18.0000 ug | ORAL_CAPSULE | Freq: Every day | RESPIRATORY_TRACT | Status: DC
  Administered 2015-06-05 – 2015-06-08 (×4): 18 ug via RESPIRATORY_TRACT
  Filled 2015-06-04: qty 5

## 2015-06-04 MED ORDER — PRIMIDONE 50 MG PO TABS
50.0000 mg | ORAL_TABLET | Freq: Two times a day (BID) | ORAL | Status: DC
  Administered 2015-06-05 – 2015-06-08 (×8): 50 mg via ORAL
  Filled 2015-06-04 (×8): qty 1

## 2015-06-04 MED ORDER — ACETAMINOPHEN 325 MG PO TABS: 650.0000 mg | ORAL_TABLET | Freq: Four times a day (QID) | ORAL | Status: DC | PRN

## 2015-06-04 MED ORDER — SODIUM CHLORIDE 0.9 % IV BOLUS (SEPSIS)
1000.0000 mL | Freq: Once | INTRAVENOUS | Status: AC
  Administered 2015-06-04: 1000 mL via INTRAVENOUS

## 2015-06-04 MED ORDER — ENOXAPARIN SODIUM 40 MG/0.4ML ~~LOC~~ SOLN
40.0000 mg | SUBCUTANEOUS | Status: DC
  Administered 2015-06-04 – 2015-06-07 (×4): 40 mg via SUBCUTANEOUS
  Filled 2015-06-04 (×4): qty 0.4

## 2015-06-04 MED ORDER — DIATRIZOATE MEGLUMINE & SODIUM 66-10 % PO SOLN
15.0000 mL | Freq: Once | ORAL | Status: AC
  Administered 2015-06-04: 15 mL via ORAL

## 2015-06-04 MED ORDER — ACETAMINOPHEN 650 MG RE SUPP: 650.0000 mg | Freq: Four times a day (QID) | RECTAL | Status: DC | PRN

## 2015-06-04 MED ORDER — ONDANSETRON HCL 4 MG PO TABS: 4.0000 mg | ORAL_TABLET | Freq: Three times a day (TID) | ORAL | Status: DC | PRN

## 2015-06-04 MED ORDER — ONDANSETRON HCL 4 MG/2ML IJ SOLN
4.0000 mg | Freq: Once | INTRAMUSCULAR | Status: AC
  Administered 2015-06-04: 4 mg via INTRAVENOUS
  Filled 2015-06-04: qty 2

## 2015-06-04 MED ORDER — DIATRIZOATE MEGLUMINE & SODIUM 66-10 % PO SOLN: 15.0000 mL | Freq: Once | ORAL | Status: DC

## 2015-06-04 MED ORDER — ALPRAZOLAM 0.25 MG PO TABS
0.2500 mg | ORAL_TABLET | Freq: Two times a day (BID) | ORAL | Status: DC | PRN
  Administered 2015-06-05 – 2015-06-07 (×5): 0.25 mg via ORAL
  Filled 2015-06-04 (×6): qty 1

## 2015-06-04 NOTE — ED Notes (Signed)
Family reports pt with umbilical pain, nausea and shortness of breath yesterday; worse today and now increased weakness.

## 2015-06-04 NOTE — H&P (Addendum)
[facility] NAME: Chase Simmons    MR#:  [id number]  DATE OF BIRTH:  12-20-1933  DATE OF ADMISSION:  06/04/2015  PRIMARY CARE PHYSICIAN: Rubbie Battiest, NP   REQUESTING/REFERRING PHYSICIAN: DR Joni Fears  CHIEF COMPLAINT:   Chief Complaint  Patient presents with  . Abdominal Pain    HISTORY OF PRESENT ILLNESS:  Chase Simmons  is a 82 y.o. male with a known history of COPD, diabetes, hypertension, bladder cancer. He presents with 2 days worth of abdominal pain. Yesterday was 10 out of 10 intensity. Now down to 5-6 out of 10 intensity. He was sweating and clamming yesterday was having chills and dry heaves. No energy and just feeling sick.  PAST MEDICAL HISTORY:   Past Medical History  Diagnosis Date  . Cancer [facility]) pt unsure of year    bladder cancer-Dr. Jacqlyn Larsen  . Diabetes mellitus without complication ([place])   . COPD (chronic obstructive pulmonary disease) ([place])   . Allergy   . Arrhythmia   . Hypertension   . Hyperlipidemia   . Urinary incontinence   . UTI (lower urinary tract infection)   . Tremor, essential     sees [facility] neurologist    PAST SURGICAL HISTORY:   Past Surgical History  Procedure Laterality Date  . Bladder cancer    . Cataract extraction    . Hernia repair      SOCIAL HISTORY:   Social History  Substance Use Topics  . Smoking status: Former Smoker -- 1.50 packs/day for 40 years  . Smokeless tobacco: Never Used  . Alcohol Use: 7.2 oz/week    12 Standard drinks or equivalent per week     Comment: 1-2 drinks daily    FAMILY HISTORY:   Family History  Problem Relation Age of Onset  . Arthritis Mother   . Dementia Mother   . Cancer Maternal Aunt     breast cancer  . Cancer Maternal Uncle     prostate    DRUG ALLERGIES:   Allergies  Allergen Reactions  . Latex Itching    REVIEW OF SYSTEMS:  CONSTITUTIONAL: Sweats positive, chills positive positive for fatigue.  EYES:  Blurry vision  EARS, NOSE, AND THROAT: No tinnitus or ear pain. No sore throat. Positive Raynaud's. Positive for postnasal drip RESPIRATORY: Positive for cough, positive for shortness of breath, no wheezing or hemoptysis.  CARDIOVASCULAR: No chest pain, orthopnea, edema.  GASTROINTESTINAL: Positive for nausea, vomiting, and abdominal pain. No blood in bowel movements. Alternating diarrhea and constipation GENITOURINARY: No dysuria, hematuria.  ENDOCRINE: No polyuria, nocturia,  HEMATOLOGY: No anemia, easy bruising or bleeding SKIN: No rash or lesion. MUSCULOSKELETAL: Positive joint pains.Marland Kitchen   NEUROLOGIC: No tingling, numbness, weakness.  PSYCHIATRY: No anxiety or depression.   MEDICATIONS AT HOME:   Prior to Admission medications   Medication Sig Start Date End Date Taking? Authorizing Provider  ALPRAZolam Duanne Moron) 0.25 MG tablet take 1 tablet by mouth twice a day if needed for anxiety 01/08/15  Yes Rubbie Battiest, NP  atorvastatin (LIPITOR) 80 MG tablet TAKE 1 TABLET ONE TIME DAILY 04/13/15  Yes Rubbie Battiest, NP  carvedilol (COREG) 12.5 MG tablet TAKE 1 TABLET  2 (TWO) TIMES DAILY WITH A MEAL. 04/09/15  Yes Rubbie Battiest, NP  Cholecalciferol (VITAMIN D-1000 MAX ST) 1000 units tablet Take 1,000 mg by mouth daily.    Yes Historical Provider, MD  fexofenadine (ALLEGRA) 180 MG tablet Take 180 mg by mouth  daily as needed for allergies or rhinitis.   Yes Historical Provider, MD  glimepiride (AMARYL) 4 MG tablet Take 1 tablet (4 mg total) by mouth daily with breakfast. 03/16/15  Yes Rubbie Battiest, NP  hydrochlorothiazide (HYDRODIURIL) 12.5 MG tablet Take 1 tablet (12.5 mg total) by mouth daily. Patient taking differently: Take 12.5 mg by mouth daily as needed.  04/13/15  Yes Rubbie Battiest, NP  lisinopril (PRINIVIL,ZESTRIL) 10 MG tablet TAKE 1 TABLET EVERY DAY 04/09/15  Yes Rubbie Battiest, NP  meclizine (ANTIVERT) 25 MG tablet Take 1 tablet (25 mg total) by mouth 3 (three) times daily as needed for dizziness  or nausea. 04/20/15  Yes Daymon Larsen, MD  metFORMIN (GLUCOPHAGE) 1000 MG tablet TAKE 1 TABLET TWICE DAILY  WITH  FOOD 04/08/15  Yes Rubbie Battiest, NP  ondansetron (ZOFRAN) 4 MG tablet Take 1 tablet (4 mg total) by mouth every 8 (eight) hours as needed for nausea or vomiting. 04/20/15  Yes Daymon Larsen, MD  PARoxetine (PAXIL) 40 MG tablet Take 1 tablet (40 mg total) by mouth daily. 01/08/15  Yes Rubbie Battiest, NP  primidone (MYSOLINE) 50 MG tablet TAKE 1 TABLET TWICE DAILY 04/09/15  Yes Rubbie Battiest, NP  tiotropium (SPIRIVA) 18 MCG inhalation capsule Place 1 capsule (18 mcg total) into inhaler and inhale daily. 01/08/15  Yes Rubbie Battiest, NP      VITAL SIGNS:  Blood pressure 111/56, pulse 58, temperature 97.9 F (36.6 C), temperature source Oral, resp. rate 21, height 5\' 11"  (1.803 m), weight 108.863 kg (240 lb), SpO2 99 %.  PHYSICAL EXAMINATION:  GENERAL:  82 y.o.-year-old patient lying in the bed with no acute distress.  EYES: Pupils equal, round, reactive to light and accommodation. No scleral icterus. Extraocular muscles intact.  HEENT: Head atraumatic, normocephalic. Oropharynx and nasopharynx clear.  NECK:  Supple, no jugular venous distention. No thyroid enlargement, no tenderness.  LUNGS: Normal breath sounds bilaterally, no wheezing, rales,rhonchi or crepitation. No use of accessory muscles of respiration.  CARDIOVASCULAR: S1, S2 normal. No murmurs, rubs, or gallops.  ABDOMEN: Soft, Slight mid abdominal pain, nondistended. Bowel sounds present. No organomegaly or mass.  EXTREMITIES: No pedal edema, cyanosis, or clubbing.  NEUROLOGIC: Cranial nerves II through XII are intact. Muscle strength 5/5 in all extremities. Sensation intact. Gait not checked.  PSYCHIATRIC: The patient is alert and oriented x 3.  SKIN: Vitiligo with lack of pigment on bilateral arms. Also some bruising on the left arm.  LABORATORY PANEL:   CBC  Recent Labs Lab 06/04/15 1354  WBC 14.1*  HGB 13.2  HCT  39.1*  PLT 168   ------------------------------------------------------------------------------------------------------------------  Chemistries   Recent Labs Lab 06/04/15 1354  NA 135  K 4.3  CL 101  CO2 27  GLUCOSE 191*  BUN 30*  CREATININE 1.43*  CALCIUM 8.9  AST 196*  ALT 275*  ALKPHOS 75  BILITOT 1.1   ------------------------------------------------------------------------------------------------------------------  Cardiac Enzymes  Recent Labs Lab 06/04/15 1354  TROPONINI 0.06*   ------------------------------------------------------------------------------------------------------------------  RADIOLOGY:  Ct Abdomen Pelvis W Contrast  06/04/2015  CLINICAL DATA:  Periumbilical/ midline pain since yesterday. Nausea and dry heaves. EXAM: CT ABDOMEN AND PELVIS WITH CONTRAST TECHNIQUE: Multidetector CT imaging of the abdomen and pelvis was performed using the standard protocol following bolus administration of intravenous contrast. CONTRAST:  39mL ISOVUE-300 IOPAMIDOL (ISOVUE-300) INJECTION 61% COMPARISON:  None. FINDINGS: Normal lung bases. No free air. There is increased attenuation in the mesenteric fat, extending into the  right greater than left pericolic gutters, with a small amount of free fluid in the pelvis. The increased attenuation of fat is a little more focal around the pancreatic head. There may be minimal increased attenuation adjacent to the gallbladder as well. No fluid collections. The liver and portal vein are normal. There is cholelithiasis with mild prominence of the gallbladder wall and suggestion of minimal adjacent increased fat stranding adjacent to the gallbladder. The spleen and adrenal glands are normal. There is a small renal cyst on the right. No hydronephrosis or acute perinephric stranding. No ureterectasis or ureteral stones. Atherosclerosis is seen in the non aneurysmal aorta. No adenopathy. The stomach is normal in appearance. There is a duodenal  diverticulum. The small bowel is otherwise unremarkable. Colonic diverticulosis is seen without diverticulitis. No convincing evidence of colitis. The visualized appendix is normal. Hernia mesh is associated with the ventral wall. The pelvis contains a small amount of free fluid. No adenopathy. The bladder is normal. The prostate and seminal vesicles are unremarkable. Degenerative changes are seen within the spine. No acute bony abnormalities identified. IMPRESSION: 1. There is increased attenuation in the fat of the mesentery extending down the right greater the left pericolic gutters with a small amount of fluid in the pelvis. While there is no definitive discrete source, I suspect this is arising from the pancreas and/or the gallbladder. The patient's elevated lipase suggests the possibility of pancreatitis. The patient also has cholelithiasis with a borderline gallbladder wall thickness. An ultrasound could better evaluate the gallbladder as clinically warranted. Electronically Signed   By: Dorise Bullion III M.D   On: 06/04/2015 16:29   US Abdomen Limited Ruq  06/04/2015  CLINICAL DATA:  Elevated LFTs.  Abdominal pain. EXAM: US ABDOMEN LIMITED - RIGHT UPPER QUADRANT COMPARISON:  CT scan from earlier today FINDINGS: Gallbladder: Sludge and stones are seen in the gallbladder. The gallbladder wall measures 4.2 mm. No Murphy's sign reported. No pericholecystic fluid. Common bile duct: Diameter: 6.6 mm, within normal limits for age. Liver: The liver was difficult to penetrate limiting evaluation. No focal masses identified. IMPRESSION: Cholelithiasis with mild wall thickening but no reported Murphy's sign or pericholecystic fluid. If there is continued concern for acute cholecystitis, a HIDA scan could further evaluate. Electronically Signed   By: Dorise Bullion III M.D   On: 06/04/2015 18:06    EKG:   Ordered by me  IMPRESSION AND PLAN:   1. Acute pancreatitis likely gallstone related with elevated  liver function tests. Give IV fluids, keep nothing by mouth. Surgical consult. Check triglycerides. Hold hydrochlorothiazide and statin at this point. His bilirubin is normal I do not think there is a stone in the duct. We'll get EKG just in case surgery needed. 2. COPD exacerbation. On DuoNeb nebulizer solution. 3. Type 2 diabetes mellitus. I will put on sliding scale insulin and hold diabetic medications 4. Essential hypertension continue usual medications 5. Depression on Paxil 6. Acute kidney injury give IV fluid hydration.  All the records are reviewed and case discussed with ED provider. Management plans discussed with the patient, family and they are in agreement.  CODE STATUS: Full code  TOTAL TIME TAKING CARE OF THIS PATIENT: 50 minutes.    Loletha Grayer M.D on 06/04/2015 at 8:21 PM  Between 7am to 6pm - Pager - [phone]  After 6pm call admission pager [phone]  [facility]  [phone]  CC: Primary care physician; Rubbie Battiest, NP

## 2015-06-04 NOTE — ED Provider Notes (Signed)
[facility] Emergency Department Provider Note        Time seen: ----------------------------------------- 2:11 PM on 06/04/2015 -----------------------------------------    I have reviewed the triage vital signs and the nursing notes.   HISTORY  Chief Complaint Abdominal Pain    HPI Chase Simmons is a 82 y.o. male who presents to ER for abdominal pain with nausea and dry heaves since yesterday. Patient states the pain is worse today and now he has increased weakness. He feels like he is dehydrated, he hasn't had much to eat or drink in the last 48 hours. He has not any fevers or chills, denies any diarrhea. He has not had this happen before.   Past Medical History  Diagnosis Date  . Cancer [facility]) pt unsure of year    bladder cancer-Dr. Jacqlyn Larsen  . Diabetes mellitus without complication ([place])   . COPD (chronic obstructive pulmonary disease) ([place])   . Allergy   . Arrhythmia   . Hypertension   . Hyperlipidemia   . Urinary incontinence   . UTI (lower urinary tract infection)   . Tremor, essential     sees [facility] neurologist    Patient Active Problem List   Diagnosis Date Noted  . Allergic rhinitis 05/14/2015  . Sleep disturbance 05/14/2015  . Non compliance with medical treatment 03/21/2015  . Knee pain, bilateral 03/16/2015  . Anxiety state 04/05/2014  . Atypical nevi 04/05/2014  . Chronic diarrhea 03/04/2013  . Tremor, essential 10/22/2012  . HLD (hyperlipidemia) 10/22/2012  . HTN (hypertension) 08/01/2012  . DM (diabetes mellitus), type 2 ([place]) 08/01/2012  . Low testosterone 08/01/2012    History reviewed. No pertinent past surgical history.  Allergies Latex  Social History Social History  Substance Use Topics  . Smoking status: Former Smoker -- 1.50 packs/day for 40 years  . Smokeless tobacco: Never Used  . Alcohol Use: 7.2 oz/week    12 Standard drinks or equivalent per week     Comment: 1-2 drinks daily    Review of  Systems Constitutional: Negative for fever. Eyes: Negative for visual changes. ENT: Negative for sore throat. Cardiovascular: Negative for chest pain. Respiratory: Positive shortness of breath Gastrointestinal: Positive for abdominal pain and vomiting Genitourinary: Negative for dysuria. Musculoskeletal: Negative for back pain. Skin: Negative for rash. Neurological: Negative for headaches, positive for weakness  10-point ROS otherwise negative.  ____________________________________________   PHYSICAL EXAM:  VITAL SIGNS: ED Triage Vitals  Enc Vitals Group     BP 06/04/15 1348 82/46 mmHg     Pulse Rate 06/04/15 1348 79     Resp 06/04/15 1348 19     Temp 06/04/15 1348 97.9 F (36.6 C)     Temp Source 06/04/15 1348 Oral     SpO2 06/04/15 1348 96 %     Weight 06/04/15 1348 240 lb (108.863 kg)     Height 06/04/15 1348 5\' 11"  (1.803 m)     Head Cir --      Peak Flow --      Pain Score 06/04/15 1349 5     Pain Loc --      Pain Edu? --      Excl. in [place]? --     Constitutional: Alert and oriented. Mild distress Eyes: Conjunctivae are normal. PERRL. Normal extraocular movements. ENT   Head: Normocephalic and atraumatic.   Nose: No congestion/rhinnorhea.   Mouth/Throat: Mucous membranes are moist.   Neck: No stridor. Cardiovascular: Normal rate, regular rhythm. No murmurs, rubs, or gallops.  Respiratory: Normal respiratory effort without tachypnea nor retractions. Breath sounds are clear and equal bilaterally. No wheezes/rales/rhonchi. Gastrointestinal: Diffuse nonfocal tenderness, normal bowel sounds. Musculoskeletal: Nontender with normal range of motion in all extremities. No lower extremity tenderness nor edema. Neurologic:  Normal speech and language. No gross focal neurologic deficits are appreciated.  Skin:  Skin is warm, dry and intact. No rash noted. Psychiatric: Mood and affect are normal. Speech and behavior are normal.   ____________________________________________  ED COURSE:  Pertinent labs & imaging results that were available during my care of the patient were reviewed by me and considered in my medical decision making (see chart for details). Patient presents to ER with abdominal pain and nausea. We'll check basic labs and imaging. He is also dehydrated and mildly hypotensive. He will receive an IV fluid bolus. ____________________________________________    LABS (pertinent positives/negatives)  Labs Reviewed  LIPASE, BLOOD - Abnormal; Notable for the following:    Lipase 177 (*)    All other components within normal limits  COMPREHENSIVE METABOLIC PANEL - Abnormal; Notable for the following:    Glucose, Bld 191 (*)    BUN 30 (*)    Creatinine, Ser 1.43 (*)    AST 196 (*)    ALT 275 (*)    GFR calc non Af Amer 45 (*)    GFR calc Af Amer 52 (*)    All other components within normal limits  CBC - Abnormal; Notable for the following:    WBC 14.1 (*)    RBC 3.95 (*)    HCT 39.1 (*)    All other components within normal limits  URINALYSIS COMPLETEWITH MICROSCOPIC ([facility] ONLY)  TROPONIN I    RADIOLOGY  CT of the abdomen and pelvis with contrast Is pending ____________________________________________  FINAL ASSESSMENT AND PLAN  Abdominal pain, dehydration  Plan: Patient with labs as dictated above. Patient has had improvement in his blood pressure with saline bolus. Currently the pain is improved, labs suggesting possible gallstone pancreatitis. CT scan is pending at this time.   Earleen Newport, MD   Note: This dictation was prepared with Dragon dictation. Any transcriptional errors that result from this process are unintentional   Earleen Newport, MD 06/04/15 1505

## 2015-06-04 NOTE — ED Notes (Signed)
Pt resting, family at bedside.  

## 2015-06-04 NOTE — ED Provider Notes (Signed)
Assumed care of the patient from Dr. Jimmye Aren at 3:00 PM. He is remained hemodynamically stable during this time. CT scan revealed likely inflammation around the pancreas consistent with gallstone pancreatitis. Evidence of cholelithiasis and possible gall bladder wall thickening on CT, certainly proceed with ultrasound which was negative for cholecystitis .  Case discussed with the hospitalist for admission. Continue IV fluids and pain control and nausea control as needed.  Carrie Mew, MD 06/04/15 [phone]

## 2015-06-05 DIAGNOSIS — K858 Other acute pancreatitis without necrosis or infection: Secondary | ICD-10-CM

## 2015-06-05 DIAGNOSIS — R1033 Periumbilical pain: Secondary | ICD-10-CM

## 2015-06-05 LAB — CBC
HEMATOCRIT: 36.5 % — AB (ref 40.0–52.0)
Hemoglobin: 12.5 g/dL — ABNORMAL LOW (ref 13.0–18.0)
MCH: 34.2 pg — ABNORMAL HIGH (ref 26.0–34.0)
MCHC: 34.4 g/dL (ref 32.0–36.0)
MCV: 99.5 fL (ref 80.0–100.0)
Platelets: 143 10*3/uL — ABNORMAL LOW (ref 150–440)
RBC: 3.67 MIL/uL — ABNORMAL LOW (ref 4.40–5.90)
RDW: 12.9 % (ref 11.5–14.5)
WBC: 10.4 10*3/uL (ref 3.8–10.6)

## 2015-06-05 LAB — BASIC METABOLIC PANEL
Anion gap: 6 (ref 5–15)
BUN: 29 mg/dL — AB (ref 6–20)
CHLORIDE: 102 mmol/L (ref 101–111)
CO2: 26 mmol/L (ref 22–32)
Calcium: 8.2 mg/dL — ABNORMAL LOW (ref 8.9–10.3)
Creatinine, Ser: 1.29 mg/dL — ABNORMAL HIGH (ref 0.61–1.24)
GFR calc Af Amer: 59 mL/min — ABNORMAL LOW (ref >60)
GFR calc non Af Amer: 51 mL/min — ABNORMAL LOW (ref >60)
Glucose, Bld: 139 mg/dL — ABNORMAL HIGH (ref 65–99)
POTASSIUM: 3.7 mmol/L (ref 3.5–5.1)
SODIUM: 134 mmol/L — AB (ref 135–145)

## 2015-06-05 LAB — GLUCOSE, CAPILLARY
GLUCOSE-CAPILLARY: 116 mg/dL — AB (ref 65–99)
Glucose-Capillary: 173 mg/dL — ABNORMAL HIGH (ref 65–99)
Glucose-Capillary: 137 mg/dL — ABNORMAL HIGH (ref 65–99)

## 2015-06-05 LAB — HEPATIC FUNCTION PANEL
ALBUMIN: 3.2 g/dL — AB (ref 3.5–5.0)
ALK PHOS: 68 U/L (ref 38–126)
ALT: 198 U/L — ABNORMAL HIGH (ref 17–63)
AST: 122 U/L — AB (ref 15–41)
BILIRUBIN TOTAL: 1.3 mg/dL — AB (ref 0.3–1.2)
Bilirubin, Direct: 0.4 mg/dL (ref 0.1–0.5)
Indirect Bilirubin: 0.9 mg/dL (ref 0.3–0.9)
Total Protein: 6 g/dL — ABNORMAL LOW (ref 6.5–8.1)

## 2015-06-05 LAB — LIPASE, BLOOD: Lipase: 81 U/L — ABNORMAL HIGH (ref 11–51)

## 2015-06-05 MED ORDER — INSULIN ASPART 100 UNIT/ML ~~LOC~~ SOLN
0.0000 [IU] | Freq: Three times a day (TID) | SUBCUTANEOUS | Status: DC
  Administered 2015-06-05: 3 [IU] via SUBCUTANEOUS
  Administered 2015-06-06 – 2015-06-07 (×2): 2 [IU] via SUBCUTANEOUS
  Administered 2015-06-07: 3 [IU] via SUBCUTANEOUS
  Administered 2015-06-08: 5 [IU] via SUBCUTANEOUS
  Filled 2015-06-05: qty 5
  Filled 2015-06-05: qty 3
  Filled 2015-06-05: qty 2
  Filled 2015-06-05: qty 3
  Filled 2015-06-05: qty 2

## 2015-06-05 MED ORDER — HALOPERIDOL LACTATE 5 MG/ML IJ SOLN
2.0000 mg | Freq: Once | INTRAMUSCULAR | Status: AC
  Administered 2015-06-06: 2 mg via INTRAVENOUS
  Filled 2015-06-05: qty 1

## 2015-06-05 MED ORDER — INSULIN ASPART 100 UNIT/ML ~~LOC~~ SOLN: 0.0000 [IU] | Freq: Every day | SUBCUTANEOUS | Status: DC

## 2015-06-05 NOTE — Care Management Important Message (Signed)
Important Message  Patient Details  Name: ROXY KEMME MRN: [id number] Date of Birth: 1933-01-31   Medicare Important Message Given:  Yes    Katrina Stack, RN 06/05/2015, 2:47 PM

## 2015-06-05 NOTE — Care Management (Signed)
Spoke with patient's daughter Charlston Kluz.  Patient resides in senior housing on [address]. He lives alone and is responsible for his bath.  Family helps but patient feels family should meet all of his needs in order for him to continue to live alone  He has been on a significant functional decline and "should not be living by himself and should be in assisted living."  Patient has many financial obligations  and can not afford to pay for assisted living or in home services.  Income is 1800 dollars a month and does not qualify for any assistance.  He has financial obligations that includes credit card debt.   [name]'s daughter is a Education officer, museum at [employer] and has investigated PACE program but patient does not qualify financially and would have to pya 700 and some dollars monthly.  have been in contact with Elder Care law firm and [employer]. He recently obtained a walker that he does not use but yesterday he was not able to stand even with the walker much less walk.  Discussed that patient may require skilled nursing short time and that it would have to be recommended by physical therapy and approved by his insurance.   Also discussed home health services: SN PT OT Aide.  PT consult is pending

## 2015-06-05 NOTE — Progress Notes (Signed)
Inpatient Diabetes Program Recommendations  AACE/ADA: New Consensus Statement on Inpatient Glycemic Control (2015)  Target Ranges:  Prepandial:   less than 140 mg/dL      Peak postprandial:   less than 180 mg/dL (1-2 hours)      Critically ill patients:  140 - 180 mg/dL   Results for Chase Simmons, Chase Simmons (MRN [id number]) as of 06/05/2015 09:44  Ref. Range 06/04/2015 13:54 06/05/2015 03:33  Glucose Latest Ref Range: 65-99 mg/dL 191 (H) 139 (H)    Admit with: Acute Pancreatitis  History: DM, COPD  Home DM Meds: Amaryl 4 mg daily       Metformin 1000 mg bid  Current Insulin Orders: None     MD- Please start Novolog Moderate Correction Scale/ SSI (0-15 units) Q4 hours     --Will follow patient during hospitalization--  Wyn Quaker RN, MSN, CDE Diabetes Coordinator Inpatient Glycemic Control Team Team Pager: [phone] (8a-5p)

## 2015-06-05 NOTE — Consult Note (Signed)
Surgical Consultation  06/05/2015  Chase Simmons is an 82 y.o. male.   CC: Abdominal pain  HPI: This is a elderly patient with right upper quadrant epigastric pain that is much improved now. He has been admitted with a diagnosis of probable biliary pancreatitis. He had a single emesis some nausea no jaundice or acholic stools and no fevers or chills he had an episode like this 2 weeks ago some of his history is obtained from a family member who assists in his history taking.  Past Medical History  Diagnosis Date  . Cancer [facility]) pt unsure of year    bladder cancer-Dr. Jacqlyn Larsen  . Diabetes mellitus without complication ([place])   . COPD (chronic obstructive pulmonary disease) ([place])   . Allergy   . Arrhythmia   . Hypertension   . Hyperlipidemia   . Urinary incontinence   . UTI (lower urinary tract infection)   . Tremor, essential     sees [facility] neurologist    Past Surgical History  Procedure Laterality Date  . Bladder cancer    . Cataract extraction    . Hernia repair      Family History  Problem Relation Age of Onset  . Arthritis Mother   . Dementia Mother   . Cancer Maternal Aunt     breast cancer  . Cancer Maternal Uncle     prostate    Social History:  reports that he has quit smoking. He has never used smokeless tobacco. He reports that he drinks about 7.2 oz of alcohol per week. He reports that he does not use illicit drugs.  Allergies:  Allergies  Allergen Reactions  . Latex Itching    Medications reviewed.   Review of Systems:   Review of Systems  Constitutional: Negative for fever and chills.  HENT: Negative.   Eyes: Negative.   Respiratory: Negative.   Cardiovascular: Negative.   Gastrointestinal: Positive for nausea, vomiting and abdominal pain. Negative for heartburn, diarrhea, constipation and blood in stool.  Genitourinary: Negative.   Musculoskeletal: Negative.   Skin: Negative.   Neurological: Negative.   Endo/Heme/Allergies: Negative.    Psychiatric/Behavioral: Negative.      Physical Exam:  BP 139/70 mmHg  Pulse 62  Temp(Src) 98.4 F (36.9 C) (Oral)  Resp 18  Ht '5\' 11"'  (1.803 m)  Wt 240 lb (108.863 kg)  BMI 33.49 kg/m2  SpO2 98%  Physical Exam  Constitutional: He is oriented to person, place, and time and well-developed, well-nourished, and in no distress. No distress.  Elderly  HENT:  Head: Normocephalic and atraumatic.  Mouth/Throat: No oropharyngeal exudate.  Eyes: Pupils are equal, round, and reactive to light. Right eye exhibits discharge. Left eye exhibits discharge. No scleral icterus.  Neck: Normal range of motion.  Cardiovascular: Normal rate, regular rhythm and normal heart sounds.   Pulmonary/Chest: Effort normal and breath sounds normal. No respiratory distress. He has no wheezes. He has no rales.  Abdominal: Soft. He exhibits no distension. There is no tenderness. There is no rebound and no guarding.  Musculoskeletal: Normal range of motion. He exhibits edema.  Lymphadenopathy:    He has no cervical adenopathy.  Neurological: He is alert and oriented to person, place, and time.  Skin: Skin is warm and dry. He is not diaphoretic. No erythema.  Psychiatric: Mood and affect normal.  Vitals reviewed.     Results for orders placed or performed during the hospital encounter of 06/04/15 (from the past 48 hour(s))  Lipase, blood  Status: Abnormal   Collection Time: 06/04/15  1:54 PM  Result Value Ref Range   Lipase 177 (H) 11 - 51 U/L  Comprehensive metabolic panel     Status: Abnormal   Collection Time: 06/04/15  1:54 PM  Result Value Ref Range   Sodium 135 135 - 145 mmol/L   Potassium 4.3 3.5 - 5.1 mmol/L   Chloride 101 101 - 111 mmol/L   CO2 27 22 - 32 mmol/L   Glucose, Bld 191 (H) 65 - 99 mg/dL   BUN 30 (H) 6 - 20 mg/dL   Creatinine, Ser 1.43 (H) 0.61 - 1.24 mg/dL   Calcium 8.9 8.9 - 10.3 mg/dL   Total Protein 6.6 6.5 - 8.1 g/dL   Albumin 3.5 3.5 - 5.0 g/dL   AST 196 (H) 15 - 41  U/L   ALT 275 (H) 17 - 63 U/L   Alkaline Phosphatase 75 38 - 126 U/L   Total Bilirubin 1.1 0.3 - 1.2 mg/dL   GFR calc non Af Amer 45 (L) >60 mL/min   GFR calc Af Amer 52 (L) >60 mL/min    Comment: (NOTE) The eGFR has been calculated using the CKD EPI equation. This calculation has not been validated in all clinical situations. eGFR's persistently <60 mL/min signify possible Chronic Kidney Disease.    Anion gap 7 5 - 15  CBC     Status: Abnormal   Collection Time: 06/04/15  1:54 PM  Result Value Ref Range   WBC 14.1 (H) 3.8 - 10.6 K/uL   RBC 3.95 (L) 4.40 - 5.90 MIL/uL   Hemoglobin 13.2 13.0 - 18.0 g/dL   HCT 39.1 (L) 40.0 - 52.0 %   MCV 99.0 80.0 - 100.0 fL   MCH 33.3 26.0 - 34.0 pg   MCHC 33.6 32.0 - 36.0 g/dL   RDW 13.0 11.5 - 14.5 %   Platelets 168 150 - 440 K/uL  Troponin I     Status: Abnormal   Collection Time: 06/04/15  1:54 PM  Result Value Ref Range   Troponin I 0.06 (H) <0.031 ng/mL    Comment: READ BACK AND VERIFIED WITH JERRIE THOMPSON AT 1654 06/04/15 MLZ        PERSISTENTLY INCREASED TROPONIN VALUES IN THE RANGE OF 0.04-0.49 ng/mL CAN BE SEEN IN:       -UNSTABLE ANGINA       -CONGESTIVE HEART FAILURE       -MYOCARDITIS       -CHEST TRAUMA       -ARRYHTHMIAS       -LATE PRESENTING MYOCARDIAL INFARCTION       -COPD   CLINICAL FOLLOW-UP RECOMMENDED.   Triglycerides     Status: None   Collection Time: 06/04/15  1:54 PM  Result Value Ref Range   Triglycerides 54 <150 mg/dL  Urinalysis complete, with microscopic     Status: Abnormal   Collection Time: 06/04/15  5:03 PM  Result Value Ref Range   Color, Urine YELLOW (A) YELLOW   APPearance CLEAR (A) CLEAR   Glucose, UA NEGATIVE NEGATIVE mg/dL   Bilirubin Urine NEGATIVE NEGATIVE   Ketones, ur NEGATIVE NEGATIVE mg/dL   Specific Gravity, Urine 1.030 1.005 - 1.030   Hgb urine dipstick 2+ (A) NEGATIVE   pH 5.0 5.0 - 8.0   Protein, ur NEGATIVE NEGATIVE mg/dL   Nitrite NEGATIVE NEGATIVE   Leukocytes, UA  NEGATIVE NEGATIVE   RBC / HPF TOO NUMEROUS TO COUNT 0 - 5 RBC/hpf  WBC, UA 6-30 0 - 5 WBC/hpf   Bacteria, UA NONE SEEN NONE SEEN   Squamous Epithelial / LPF 0-5 (A) NONE SEEN   Mucous PRESENT   Basic metabolic panel     Status: Abnormal   Collection Time: 06/05/15  3:33 AM  Result Value Ref Range   Sodium 134 (L) 135 - 145 mmol/L   Potassium 3.7 3.5 - 5.1 mmol/L   Chloride 102 101 - 111 mmol/L   CO2 26 22 - 32 mmol/L   Glucose, Bld 139 (H) 65 - 99 mg/dL   BUN 29 (H) 6 - 20 mg/dL   Creatinine, Ser 1.29 (H) 0.61 - 1.24 mg/dL   Calcium 8.2 (L) 8.9 - 10.3 mg/dL   GFR calc non Af Amer 51 (L) >60 mL/min   GFR calc Af Amer 59 (L) >60 mL/min    Comment: (NOTE) The eGFR has been calculated using the CKD EPI equation. This calculation has not been validated in all clinical situations. eGFR's persistently <60 mL/min signify possible Chronic Kidney Disease.    Anion gap 6 5 - 15  CBC     Status: Abnormal   Collection Time: 06/05/15  3:33 AM  Result Value Ref Range   WBC 10.4 3.8 - 10.6 K/uL   RBC 3.67 (L) 4.40 - 5.90 MIL/uL   Hemoglobin 12.5 (L) 13.0 - 18.0 g/dL   HCT 36.5 (L) 40.0 - 52.0 %   MCV 99.5 80.0 - 100.0 fL   MCH 34.2 (H) 26.0 - 34.0 pg   MCHC 34.4 32.0 - 36.0 g/dL   RDW 12.9 11.5 - 14.5 %   Platelets 143 (L) 150 - 440 K/uL  Lipase, blood     Status: Abnormal   Collection Time: 06/05/15  3:33 AM  Result Value Ref Range   Lipase 81 (H) 11 - 51 U/L  Hepatic function panel     Status: Abnormal   Collection Time: 06/05/15  3:33 AM  Result Value Ref Range   Total Protein 6.0 (L) 6.5 - 8.1 g/dL   Albumin 3.2 (L) 3.5 - 5.0 g/dL   AST 122 (H) 15 - 41 U/L   ALT 198 (H) 17 - 63 U/L   Alkaline Phosphatase 68 38 - 126 U/L   Total Bilirubin 1.3 (H) 0.3 - 1.2 mg/dL   Bilirubin, Direct 0.4 0.1 - 0.5 mg/dL   Indirect Bilirubin 0.9 0.3 - 0.9 mg/dL  Glucose, capillary     Status: Abnormal   Collection Time: 06/05/15 11:56 AM  Result Value Ref Range   Glucose-Capillary 116 (H) 65  - 99 mg/dL   Ct Abdomen Pelvis W Contrast  06/04/2015  CLINICAL DATA:  Periumbilical/ midline pain since yesterday. Nausea and dry heaves. EXAM: CT ABDOMEN AND PELVIS WITH CONTRAST TECHNIQUE: Multidetector CT imaging of the abdomen and pelvis was performed using the standard protocol following bolus administration of intravenous contrast. CONTRAST:  85m ISOVUE-300 IOPAMIDOL (ISOVUE-300) INJECTION 61% COMPARISON:  None. FINDINGS: Normal lung bases. No free air. There is increased attenuation in the mesenteric fat, extending into the right greater than left pericolic gutters, with a small amount of free fluid in the pelvis. The increased attenuation of fat is a little more focal around the pancreatic head. There may be minimal increased attenuation adjacent to the gallbladder as well. No fluid collections. The liver and portal vein are normal. There is cholelithiasis with mild prominence of the gallbladder wall and suggestion of minimal adjacent increased fat stranding adjacent to the gallbladder. The spleen  and adrenal glands are normal. There is a small renal cyst on the right. No hydronephrosis or acute perinephric stranding. No ureterectasis or ureteral stones. Atherosclerosis is seen in the non aneurysmal aorta. No adenopathy. The stomach is normal in appearance. There is a duodenal diverticulum. The small bowel is otherwise unremarkable. Colonic diverticulosis is seen without diverticulitis. No convincing evidence of colitis. The visualized appendix is normal. Hernia mesh is associated with the ventral wall. The pelvis contains a small amount of free fluid. No adenopathy. The bladder is normal. The prostate and seminal vesicles are unremarkable. Degenerative changes are seen within the spine. No acute bony abnormalities identified. IMPRESSION: 1. There is increased attenuation in the fat of the mesentery extending down the right greater the left pericolic gutters with a small amount of fluid in the pelvis.  While there is no definitive discrete source, I suspect this is arising from the pancreas and/or the gallbladder. The patient's elevated lipase suggests the possibility of pancreatitis. The patient also has cholelithiasis with a borderline gallbladder wall thickness. An ultrasound could better evaluate the gallbladder as clinically warranted. Electronically Signed   By: Dorise Bullion III M.D   On: 06/04/2015 16:29   US Abdomen Limited Ruq  06/04/2015  CLINICAL DATA:  Elevated LFTs.  Abdominal pain. EXAM: US ABDOMEN LIMITED - RIGHT UPPER QUADRANT COMPARISON:  CT scan from earlier today FINDINGS: Gallbladder: Sludge and stones are seen in the gallbladder. The gallbladder wall measures 4.2 mm. No Murphy's sign reported. No pericholecystic fluid. Common bile duct: Diameter: 6.6 mm, within normal limits for age. Liver: The liver was difficult to penetrate limiting evaluation. No focal masses identified. IMPRESSION: Cholelithiasis with mild wall thickening but no reported Murphy's sign or pericholecystic fluid. If there is continued concern for acute cholecystitis, a HIDA scan could further evaluate. Electronically Signed   By: Dorise Bullion III M.D   On: 06/04/2015 18:06    Assessment/Plan:  Improving lipase and liver function tests with likely biliary pancreatitis. I discussed with he and his family member the options of a planned laparoscopic cholecystectomy once his labs returned to normal and also the options of observation we discussed this in detail and we will follow the patient while in the hospital.  Florene Glen, MD, FACS

## 2015-06-05 NOTE — Progress Notes (Signed)
[facility] [place] NAME: Chase Simmons    MR#:  [id number]  DATE OF BIRTH:  [birth year]  SUBJECTIVE:  Patient is reporting his abdominal pain is improved. He is feeling weak.   REVIEW OF SYSTEMS:    Review of Systems  Constitutional: Positive for malaise/fatigue. Negative for fever and chills.  HENT: Negative for ear discharge, ear pain, hearing loss, nosebleeds and sore throat.   Eyes: Negative for blurred vision and pain.  Respiratory: Negative for cough, hemoptysis, shortness of breath and wheezing.   Cardiovascular: Negative for chest pain, palpitations and leg swelling.  Gastrointestinal: Positive for abdominal pain. Negative for nausea, vomiting, diarrhea and blood in stool.  Genitourinary: Negative for dysuria.  Musculoskeletal: Negative for back pain.  Neurological: Positive for weakness. Negative for dizziness, tremors, speech change, focal weakness, seizures and headaches.  Endo/Heme/Allergies: Does not bruise/bleed easily.  Psychiatric/Behavioral: Negative for depression, suicidal ideas and hallucinations.    Tolerating Diet: Nothing by mouth      DRUG ALLERGIES:   Allergies  Allergen Reactions  . Latex Itching    VITALS:  Blood pressure 156/55, pulse 65, temperature 98.4 F (36.9 C), temperature source Oral, resp. rate 18, height 5\' 11"  (1.803 m), weight 108.863 kg (240 lb), SpO2 98 %.  PHYSICAL EXAMINATION:   Physical Exam  Constitutional: He is oriented to person, place, and time and well-developed, well-nourished, and in no distress. No distress.  HENT:  Head: Normocephalic.  Eyes: No scleral icterus.  Neck: Normal range of motion. Neck supple. No JVD present. No tracheal deviation present.  Cardiovascular: Normal rate, regular rhythm and normal heart sounds.  Exam reveals no gallop and no friction rub.   No murmur heard. Pulmonary/Chest: Effort normal and breath sounds normal. No respiratory distress. He  has no wheezes. He has no rales. He exhibits no tenderness.  Abdominal: Soft. Bowel sounds are normal. He exhibits no distension and no mass. There is no tenderness. There is no rebound and no guarding.  Musculoskeletal: Normal range of motion. He exhibits no edema.  Neurological: He is alert and oriented to person, place, and time.  Skin: Skin is warm. No rash noted. No erythema.  Psychiatric: Affect and judgment normal.      LABORATORY PANEL:   CBC  Recent Labs Lab 06/05/15 0333  WBC 10.4  HGB 12.5*  HCT 36.5*  PLT 143*   ------------------------------------------------------------------------------------------------------------------  Chemistries   Recent Labs Lab 06/05/15 0333  NA 134*  K 3.7  CL 102  CO2 26  GLUCOSE 139*  BUN 29*  CREATININE 1.29*  CALCIUM 8.2*  AST 122*  ALT 198*  ALKPHOS 68  BILITOT 1.3*   ------------------------------------------------------------------------------------------------------------------  Cardiac Enzymes  Recent Labs Lab 06/04/15 1354  TROPONINI 0.06*   ------------------------------------------------------------------------------------------------------------------  RADIOLOGY:  Ct Abdomen Pelvis W Contrast  06/04/2015  CLINICAL DATA:  Periumbilical/ midline pain since yesterday. Nausea and dry heaves. EXAM: CT ABDOMEN AND PELVIS WITH CONTRAST TECHNIQUE: Multidetector CT imaging of the abdomen and pelvis was performed using the standard protocol following bolus administration of intravenous contrast. CONTRAST:  83mL ISOVUE-300 IOPAMIDOL (ISOVUE-300) INJECTION 61% COMPARISON:  None. FINDINGS: Normal lung bases. No free air. There is increased attenuation in the mesenteric fat, extending into the right greater than left pericolic gutters, with a small amount of free fluid in the pelvis. The increased attenuation of fat is a little more focal around the pancreatic head. There may be minimal increased attenuation adjacent to the  gallbladder as  well. No fluid collections. The liver and portal vein are normal. There is cholelithiasis with mild prominence of the gallbladder wall and suggestion of minimal adjacent increased fat stranding adjacent to the gallbladder. The spleen and adrenal glands are normal. There is a small renal cyst on the right. No hydronephrosis or acute perinephric stranding. No ureterectasis or ureteral stones. Atherosclerosis is seen in the non aneurysmal aorta. No adenopathy. The stomach is normal in appearance. There is a duodenal diverticulum. The small bowel is otherwise unremarkable. Colonic diverticulosis is seen without diverticulitis. No convincing evidence of colitis. The visualized appendix is normal. Hernia mesh is associated with the ventral wall. The pelvis contains a small amount of free fluid. No adenopathy. The bladder is normal. The prostate and seminal vesicles are unremarkable. Degenerative changes are seen within the spine. No acute bony abnormalities identified. IMPRESSION: 1. There is increased attenuation in the fat of the mesentery extending down the right greater the left pericolic gutters with a small amount of fluid in the pelvis. While there is no definitive discrete source, I suspect this is arising from the pancreas and/or the gallbladder. The patient's elevated lipase suggests the possibility of pancreatitis. The patient also has cholelithiasis with a borderline gallbladder wall thickness. An ultrasound could better evaluate the gallbladder as clinically warranted. Electronically Signed   By: Dorise Bullion III M.D   On: 06/04/2015 16:29   US Abdomen Limited Ruq  06/04/2015  CLINICAL DATA:  Elevated LFTs.  Abdominal pain. EXAM: US ABDOMEN LIMITED - RIGHT UPPER QUADRANT COMPARISON:  CT scan from earlier today FINDINGS: Gallbladder: Sludge and stones are seen in the gallbladder. The gallbladder wall measures 4.2 mm. No Murphy's sign reported. No pericholecystic fluid. Common bile duct:  Diameter: 6.6 mm, within normal limits for age. Liver: The liver was difficult to penetrate limiting evaluation. No focal masses identified. IMPRESSION: Cholelithiasis with mild wall thickening but no reported Murphy's sign or pericholecystic fluid. If there is continued concern for acute cholecystitis, a HIDA scan could further evaluate. Electronically Signed   By: Dorise Bullion III M.D   On: 06/04/2015 18:06     ASSESSMENT AND PLAN:    82 year old male with a history of diabetes and COPD who presents with weakness and found to have acute pancreatitis.  1. Acute pancreatitis: Ultrasound does show cholelithiasis there is no evidence of acute cholecystitis. Continue IV fluids and supportive management. Follow-up on surgery recommendations. Continue nothing by mouth status until surgery evaluation Continue to hold hydrochlorothiazide and statin.   2. Elevated liver function tests: Possibly due to gallstone. LFTs slowly improving. Continue to hold statin  3. Diabetes: Continue sliding scale insulin  4. Essential hypertension: Continue lisinopril.  5. Depression: Continue Paxil.  6. Acute kidney injury: This is due to poor by mouth intake and has improved with IV fluids.  Management plans discussed with the patient and he is in agreement. Discussed with daughter  CODE STATUS: full  TOTAL TIME TAKING CARE OF THIS PATIENT: 30 minutes.   PT consult for disposition  POSSIBLE D/C 1-2 days, DEPENDING ON CLINICAL CONDITION.   Sherby Moncayo M.D on 06/05/2015 at 11:28 AM  Between 7am to 6pm - Pager - [phone] After 6pm go to [email or web address] - password [email or web address] [facility]  Office  [phone]  CC: Primary care physician; Rubbie Battiest, NP  Note: This dictation was prepared with Dragon dictation along with smaller phrase technology. Any transcriptional errors that result from this process are unintentional.

## 2015-06-05 NOTE — Evaluation (Signed)
Physical Therapy Evaluation Patient Details Name: Chase Simmons MRN: [id number] DOB: [birth year] Today's Date: 06/05/2015   History of Present Illness  Pt admitted for complaints of abdominal pain x 2 days. Pt now with acute pancreatitis with pending surgical plans. Date unknown so was advised to perform evaluation per RN. Pt with history of COPD, DM, HTN, and bladder cancer.   Clinical Impression  Pt is a pleasant 82 year old male who was admitted for complaints of abdominal pain x 2 days. Pt performs bed mobility with mod assist, transfers with min assist, and ambulation with min assist and rw. Pt impulsive and tries to get of out bed prior to therapist instruction. Pt seems confused upon evaluation, unsure of baseline cognition status. Appears to be self limited secondary to pain. Unsure of adequate effort; takes heavy cues for participation. Pt demonstrates deficits with strength/endurance/pain. Would benefit from skilled PT to address above deficits and promote optimal return to PLOF; recommend transition to STR upon discharge from acute hospitalization.       Follow Up Recommendations SNF    Equipment Recommendations       Recommendations for Other Services       Precautions / Restrictions Precautions Precautions: Fall Restrictions Weight Bearing Restrictions: No      Mobility  Bed Mobility Overal bed mobility: Needs Assistance Bed Mobility: Supine to Sit     Supine to sit: Mod assist     General bed mobility comments: Assist for trunk mobility to sit at EOB. Heavy cues for participation needed. Once seated at EOB, pt able to sit with supervision  Transfers Overall transfer level: Needs assistance Equipment used: Rolling walker (2 wheeled) Transfers: Sit to/from Stand Sit to Stand: Min assist         General transfer comment: assist for completing transfer with cues for pushing from seated surface. Pt impulsive and tries to stand prior to therapist instruction.  Once standing, heavy cues to keep hands on rw  Ambulation/Gait Ambulation/Gait assistance: Min assist Ambulation Distance (Feet): 5 Feet Assistive device: Rolling walker (2 wheeled) Gait Pattern/deviations: Step-to pattern     General Gait Details: ambulated using rw with heavy cues for participation. Pt tries to take hands off rw, with manual assistance for guidance. Pt very unsteady and has difficulty sequencing turns. Pt self limited distance secondary to pain.  Stairs            Wheelchair Mobility    Modified Rankin (Stroke Patients Only)       Balance Overall balance assessment: History of Falls;Needs assistance Sitting-balance support: Feet supported Sitting balance-Leahy Scale: Good     Standing balance support: Bilateral upper extremity supported Standing balance-Leahy Scale: Fair                               Pertinent Vitals/Pain Pain Assessment: Faces Faces Pain Scale: Hurts whole lot Pain Location: abdomen with movement Pain Descriptors / Indicators: Constant Pain Intervention(s): Limited activity within patient's tolerance;Repositioned    Home Living Family/patient expects to be discharged to:: Private residence (independent living) Living Arrangements: Alone Available Help at Discharge: Family Type of Home: Independent living facility Home Access: Elevator (however is unable to walk distance to make it to elevator)     Home Layout: One level Home Equipment: Walker - 2 wheels      Prior Function Level of Independence: Independent with assistive device(s)  Hand Dominance        Extremity/Trunk Assessment   Upper Extremity Assessment: Overall WFL for tasks assessed           Lower Extremity Assessment: Generalized weakness (grossly 4/5)         Communication   Communication: No difficulties  Cognition Arousal/Alertness: Lethargic Behavior During Therapy: Restless Overall Cognitive Status:  Difficult to assess                      General Comments      Exercises Other Exercises Other Exercises: Pt performed seated ther-ex at EOB including B LE ankle pumps, LAQ, and alt. marching. All ther-ex performed with cga and cues for correct technique. Pt takes heavy encouragement to work with therapy      Assessment/Plan    PT Assessment Patient needs continued PT services  PT Diagnosis Difficulty walking;Generalized weakness;Acute pain   PT Problem List Decreased strength;Decreased activity tolerance;Decreased mobility;Pain;Decreased knowledge of use of DME  PT Treatment Interventions Gait training;Therapeutic exercise;DME instruction   PT Goals (Current goals can be found in the Care Plan section) Acute Rehab PT Goals Patient Stated Goal: to get stronger and see his dog PT Goal Formulation: With patient Time For Goal Achievement: 06/19/15 Potential to Achieve Goals: Good    Frequency Min 2X/week   Barriers to discharge        Co-evaluation               End of Session Equipment Utilized During Treatment: Gait belt Activity Tolerance: Patient tolerated treatment well Patient left: in bed;with bed alarm set Nurse Communication: Mobility status         Time: [id number] PT Time Calculation (min) (ACUTE ONLY): 19 min   Charges:   PT Evaluation $PT Eval Moderate Complexity: 1 Procedure PT Treatments $Therapeutic Exercise: 23-37 mins   PT G Codes:        Jermone Geister 10-Jun-2015, 4:05 PM  Greggory Stallion, PT, DPT [phone]

## 2015-06-06 DIAGNOSIS — K859 Acute pancreatitis without necrosis or infection, unspecified: Secondary | ICD-10-CM

## 2015-06-06 LAB — COMPREHENSIVE METABOLIC PANEL
ALK PHOS: 72 U/L (ref 38–126)
ALT: 118 U/L — AB (ref 17–63)
AST: 61 U/L — AB (ref 15–41)
Albumin: 3 g/dL — ABNORMAL LOW (ref 3.5–5.0)
Anion gap: 7 (ref 5–15)
BUN: 20 mg/dL (ref 6–20)
CALCIUM: 8 mg/dL — AB (ref 8.9–10.3)
CHLORIDE: 102 mmol/L (ref 101–111)
CO2: 24 mmol/L (ref 22–32)
CREATININE: 0.95 mg/dL (ref 0.61–1.24)
GFR calc Af Amer: >60 mL/min (ref >60)
GFR calc non Af Amer: >60 mL/min (ref >60)
Glucose, Bld: 117 mg/dL — ABNORMAL HIGH (ref 65–99)
Potassium: 3.9 mmol/L (ref 3.5–5.1)
SODIUM: 133 mmol/L — AB (ref 135–145)
Total Bilirubin: 1.4 mg/dL — ABNORMAL HIGH (ref 0.3–1.2)
Total Protein: 6 g/dL — ABNORMAL LOW (ref 6.5–8.1)

## 2015-06-06 LAB — LIPASE, BLOOD: LIPASE: 30 U/L (ref 11–51)

## 2015-06-06 LAB — GLUCOSE, CAPILLARY
GLUCOSE-CAPILLARY: 96 mg/dL (ref 65–99)
Glucose-Capillary: 107 mg/dL — ABNORMAL HIGH (ref 65–99)
Glucose-Capillary: 144 mg/dL — ABNORMAL HIGH (ref 65–99)
Glucose-Capillary: 100 mg/dL — ABNORMAL HIGH (ref 65–99)
Glucose-Capillary: 105 mg/dL — ABNORMAL HIGH (ref 65–99)

## 2015-06-06 MED ORDER — QUETIAPINE FUMARATE ER 50 MG PO TB24
50.0000 mg | ORAL_TABLET | Freq: Every day | ORAL | Status: DC
  Administered 2015-06-06 – 2015-06-07 (×2): 50 mg via ORAL
  Filled 2015-06-06 (×2): qty 1

## 2015-06-06 NOTE — Progress Notes (Signed)
n notified MD of pt increased agitation, orders taken

## 2015-06-06 NOTE — Progress Notes (Signed)
CC: Biliary pancreatitis Subjective: This patient with biliary pancreatitis. No family is present and the patient is quite somnolent and unable to answer questions much he does shake his head when he asked about abdominal pain signifying none. No other review of systems is possible.  Objective: Vital signs in last 24 hours: Temp:  [97.4 F (36.3 C)-99 F (37.2 C)] 99 F (37.2 C) (06/03 0434) Pulse Rate:  [54-74] 54 (06/03 0434) Resp:  [16-22] 18 (06/03 0434) BP: (139-154)/(55-73) 140/60 mmHg (06/03 0434) SpO2:  [94 %-97 %] 95 % (06/03 0434)    Intake/Output from previous day: 06/02 0701 - 06/03 0700 In: 1578.8 [I.V.:1578.8] Out: 425 [Urine:425] Intake/Output this shift: Total I/O In: 240 [P.O.:240] Out: -   Physical exam:  No icterus no jaundice abdomen is soft nontender no Murphy sign nontender calves  Lab Results: CBC   Recent Labs  06/04/15 1354 06/05/15 0333  WBC 14.1* 10.4  HGB 13.2 12.5*  HCT 39.1* 36.5*  PLT 168 143*   BMET  Recent Labs  06/05/15 0333 06/06/15 0616  NA 134* 133*  K 3.7 3.9  CL 102 102  CO2 26 24  GLUCOSE 139* 117*  BUN 29* 20  CREATININE 1.29* 0.95  CALCIUM 8.2* 8.0*   PT/INR No results for input(s): LABPROT, INR in the last 72 hours. ABG No results for input(s): PHART, HCO3 in the last 72 hours.  Invalid input(s): PCO2, PO2  Studies/Results: Ct Abdomen Pelvis W Contrast  06/04/2015  CLINICAL DATA:  Periumbilical/ midline pain since yesterday. Nausea and dry heaves. EXAM: CT ABDOMEN AND PELVIS WITH CONTRAST TECHNIQUE: Multidetector CT imaging of the abdomen and pelvis was performed using the standard protocol following bolus administration of intravenous contrast. CONTRAST:  60mL ISOVUE-300 IOPAMIDOL (ISOVUE-300) INJECTION 61% COMPARISON:  None. FINDINGS: Normal lung bases. No free air. There is increased attenuation in the mesenteric fat, extending into the right greater than left pericolic gutters, with a small amount of free  fluid in the pelvis. The increased attenuation of fat is a little more focal around the pancreatic head. There may be minimal increased attenuation adjacent to the gallbladder as well. No fluid collections. The liver and portal vein are normal. There is cholelithiasis with mild prominence of the gallbladder wall and suggestion of minimal adjacent increased fat stranding adjacent to the gallbladder. The spleen and adrenal glands are normal. There is a small renal cyst on the right. No hydronephrosis or acute perinephric stranding. No ureterectasis or ureteral stones. Atherosclerosis is seen in the non aneurysmal aorta. No adenopathy. The stomach is normal in appearance. There is a duodenal diverticulum. The small bowel is otherwise unremarkable. Colonic diverticulosis is seen without diverticulitis. No convincing evidence of colitis. The visualized appendix is normal. Hernia mesh is associated with the ventral wall. The pelvis contains a small amount of free fluid. No adenopathy. The bladder is normal. The prostate and seminal vesicles are unremarkable. Degenerative changes are seen within the spine. No acute bony abnormalities identified. IMPRESSION: 1. There is increased attenuation in the fat of the mesentery extending down the right greater the left pericolic gutters with a small amount of fluid in the pelvis. While there is no definitive discrete source, I suspect this is arising from the pancreas and/or the gallbladder. The patient's elevated lipase suggests the possibility of pancreatitis. The patient also has cholelithiasis with a borderline gallbladder wall thickness. An ultrasound could better evaluate the gallbladder as clinically warranted. Electronically Signed   By: Dorise Bullion III M.D  On: 06/04/2015 16:29   US Abdomen Limited Ruq  06/04/2015  CLINICAL DATA:  Elevated LFTs.  Abdominal pain. EXAM: US ABDOMEN LIMITED - RIGHT UPPER QUADRANT COMPARISON:  CT scan from earlier today FINDINGS:  Gallbladder: Sludge and stones are seen in the gallbladder. The gallbladder wall measures 4.2 mm. No Murphy's sign reported. No pericholecystic fluid. Common bile duct: Diameter: 6.6 mm, within normal limits for age. Liver: The liver was difficult to penetrate limiting evaluation. No focal masses identified. IMPRESSION: Cholelithiasis with mild wall thickening but no reported Murphy's sign or pericholecystic fluid. If there is continued concern for acute cholecystitis, a HIDA scan could further evaluate. Electronically Signed   By: Dorise Bullion III M.D   On: 06/04/2015 18:06    Anti-infectives: Anti-infectives    Start     Dose/Rate Route Frequency Ordered Stop   06/04/15 1445  piperacillin-tazobactam (ZOSYN) IVPB 3.375 g     3.375 g 12.5 mL/hr over 240 Minutes Intravenous  Once 06/04/15 1438 06/04/15 1919      Assessment/Plan:  Labs have improved at this patient has multiple medical problems and is quite weak I am concerned that her forming a laparoscopic cholecystectomy and the patient at this time may not be in his best interest and the risks of general anesthetic are quite high and this week patient. I will discuss this with prime doc and consider seeing him as an outpatient now that his pancreatitis has improved and LFTs are normalizing.  Florene Glen, MD, FACS  06/06/2015

## 2015-06-06 NOTE — Progress Notes (Signed)
[facility] [place] NAME: Chase Simmons    MR#:  [id number]  DATE OF BIRTH:  Dec 23, 1933  SUBJECTIVE:  Patient drowsy this morning but able to awake. According to nurse did not sleep much last night  REVIEW OF SYSTEMS:    Review of Systems  Constitutional: Positive for malaise/fatigue. Negative for fever and chills.  HENT: Negative for ear discharge, ear pain, hearing loss, nosebleeds and sore throat.   Eyes: Negative for blurred vision and pain.  Respiratory: Negative for cough, hemoptysis, shortness of breath and wheezing.   Cardiovascular: Negative for chest pain, palpitations and leg swelling.  Gastrointestinal: Positive for abdominal pain. Negative for nausea, vomiting, diarrhea and blood in stool.  Genitourinary: Negative for dysuria.  Musculoskeletal: Negative for back pain.  Neurological: Positive for weakness. Negative for dizziness, tremors, speech change, focal weakness, seizures and headaches.  Endo/Heme/Allergies: Does not bruise/bleed easily.  Psychiatric/Behavioral: Negative for depression, suicidal ideas and hallucinations.    Tolerating Diet: Nothing by mouth      DRUG ALLERGIES:   Allergies  Allergen Reactions  . Latex Itching    VITALS:  Blood pressure 140/60, pulse 54, temperature 99 F (37.2 C), temperature source Oral, resp. rate 18, height 5\' 11"  (1.803 m), weight 108.863 kg (240 lb), SpO2 95 %.  PHYSICAL EXAMINATION:   Physical Exam  Constitutional: He is oriented to person, place, and time and well-developed, well-nourished, and in no distress. No distress.  HENT:  Head: Normocephalic.  Eyes: No scleral icterus.  Neck: Normal range of motion. Neck supple. No JVD present. No tracheal deviation present.  Cardiovascular: Normal rate, regular rhythm and normal heart sounds.  Exam reveals no gallop and no friction rub.   No murmur heard. Pulmonary/Chest: Effort normal and breath sounds normal. No  respiratory distress. He has no wheezes. He has no rales. He exhibits no tenderness.  Abdominal: Soft. Bowel sounds are normal. He exhibits no distension and no mass. There is no tenderness. There is no rebound and no guarding.  Musculoskeletal: Normal range of motion. He exhibits no edema.  Neurological: He is alert and oriented to person, place, and time.  Skin: Skin is warm. No rash noted. No erythema.  Psychiatric: Affect and judgment normal.      LABORATORY PANEL:   CBC  Recent Labs Lab 06/05/15 0333  WBC 10.4  HGB 12.5*  HCT 36.5*  PLT 143*   ------------------------------------------------------------------------------------------------------------------  Chemistries   Recent Labs Lab 06/06/15 0616  NA 133*  K 3.9  CL 102  CO2 24  GLUCOSE 117*  BUN 20  CREATININE 0.95  CALCIUM 8.0*  AST 61*  ALT 118*  ALKPHOS 72  BILITOT 1.4*   ------------------------------------------------------------------------------------------------------------------  Cardiac Enzymes  Recent Labs Lab 06/04/15 1354  TROPONINI 0.06*   ------------------------------------------------------------------------------------------------------------------  RADIOLOGY:  Ct Abdomen Pelvis W Contrast  06/04/2015  CLINICAL DATA:  Periumbilical/ midline pain since yesterday. Nausea and dry heaves. EXAM: CT ABDOMEN AND PELVIS WITH CONTRAST TECHNIQUE: Multidetector CT imaging of the abdomen and pelvis was performed using the standard protocol following bolus administration of intravenous contrast. CONTRAST:  42mL ISOVUE-300 IOPAMIDOL (ISOVUE-300) INJECTION 61% COMPARISON:  None. FINDINGS: Normal lung bases. No free air. There is increased attenuation in the mesenteric fat, extending into the right greater than left pericolic gutters, with a small amount of free fluid in the pelvis. The increased attenuation of fat is a little more focal around the pancreatic head. There may be minimal increased  attenuation adjacent  to the gallbladder as well. No fluid collections. The liver and portal vein are normal. There is cholelithiasis with mild prominence of the gallbladder wall and suggestion of minimal adjacent increased fat stranding adjacent to the gallbladder. The spleen and adrenal glands are normal. There is a small renal cyst on the right. No hydronephrosis or acute perinephric stranding. No ureterectasis or ureteral stones. Atherosclerosis is seen in the non aneurysmal aorta. No adenopathy. The stomach is normal in appearance. There is a duodenal diverticulum. The small bowel is otherwise unremarkable. Colonic diverticulosis is seen without diverticulitis. No convincing evidence of colitis. The visualized appendix is normal. Hernia mesh is associated with the ventral wall. The pelvis contains a small amount of free fluid. No adenopathy. The bladder is normal. The prostate and seminal vesicles are unremarkable. Degenerative changes are seen within the spine. No acute bony abnormalities identified. IMPRESSION: 1. There is increased attenuation in the fat of the mesentery extending down the right greater the left pericolic gutters with a small amount of fluid in the pelvis. While there is no definitive discrete source, I suspect this is arising from the pancreas and/or the gallbladder. The patient's elevated lipase suggests the possibility of pancreatitis. The patient also has cholelithiasis with a borderline gallbladder wall thickness. An ultrasound could better evaluate the gallbladder as clinically warranted. Electronically Signed   By: Dorise Bullion III M.D   On: 06/04/2015 16:29   US Abdomen Limited Ruq  06/04/2015  CLINICAL DATA:  Elevated LFTs.  Abdominal pain. EXAM: US ABDOMEN LIMITED - RIGHT UPPER QUADRANT COMPARISON:  CT scan from earlier today FINDINGS: Gallbladder: Sludge and stones are seen in the gallbladder. The gallbladder wall measures 4.2 mm. No Murphy's sign reported. No pericholecystic  fluid. Common bile duct: Diameter: 6.6 mm, within normal limits for age. Liver: The liver was difficult to penetrate limiting evaluation. No focal masses identified. IMPRESSION: Cholelithiasis with mild wall thickening but no reported Murphy's sign or pericholecystic fluid. If there is continued concern for acute cholecystitis, a HIDA scan could further evaluate. Electronically Signed   By: Dorise Bullion III M.D   On: 06/04/2015 18:06     ASSESSMENT AND PLAN:    82 year old male with a history of diabetes and COPD who presents with weakness and found to have acute pancreatitis.  1. Acute pancreatitis:  Likely due to passage of a gallstone Lipase is normalized Advance diet Surgery feels high risk for surgery will need outpatient follow-up to consider possible cholecystectomy   2. Elevated liver function tests: Possibly due to gallstone. LFTs trending down   3. Diabetes: Continue sliding scale insulin blood glucose labile  4. Essential hypertension: Continue lisinopril.  5. Depression: Continue Paxil.  6. Acute kidney injury: Resolving with IV hydration stop IV fluids Management plans discussed with the patient and he is in agreement. Discussed with daughter and granddaughter CODE STATUS: full  TOTAL TIME TAKING CARE OF THIS PATIENT: 30 minutes.   PT consult for disposition  Discharged to a skilled nursing facility   Dustin Flock M.D on 06/06/2015 at 12:57 PM  Between 7am to 6pm - Pager - [phone] After 6pm go to [email or web address] - password [email or web address] [facility]  Office  [phone]  CC: Primary care physician; Rubbie Battiest, NP  Note: This dictation was prepared with Dragon dictation along with smaller phrase technology. Any transcriptional errors that result from this process are unintentional.

## 2015-06-07 LAB — COMPREHENSIVE METABOLIC PANEL
ALBUMIN: 2.7 g/dL — AB (ref 3.5–5.0)
ALK PHOS: 75 U/L (ref 38–126)
ALT: 85 U/L — ABNORMAL HIGH (ref 17–63)
ANION GAP: 9 (ref 5–15)
AST: 46 U/L — AB (ref 15–41)
BILIRUBIN TOTAL: 1.1 mg/dL (ref 0.3–1.2)
BUN: 18 mg/dL (ref 6–20)
CALCIUM: 7.8 mg/dL — AB (ref 8.9–10.3)
CO2: 23 mmol/L (ref 22–32)
Chloride: 101 mmol/L (ref 101–111)
Creatinine, Ser: 0.9 mg/dL (ref 0.61–1.24)
GFR calc Af Amer: >60 mL/min (ref >60)
GLUCOSE: 88 mg/dL (ref 65–99)
POTASSIUM: 3.6 mmol/L (ref 3.5–5.1)
Sodium: 133 mmol/L — ABNORMAL LOW (ref 135–145)
TOTAL PROTEIN: 5.7 g/dL — AB (ref 6.5–8.1)

## 2015-06-07 LAB — GLUCOSE, CAPILLARY
GLUCOSE-CAPILLARY: 81 mg/dL (ref 65–99)
GLUCOSE-CAPILLARY: 156 mg/dL — AB (ref 65–99)
Glucose-Capillary: 146 mg/dL — ABNORMAL HIGH (ref 65–99)
Glucose-Capillary: 145 mg/dL — ABNORMAL HIGH (ref 65–99)

## 2015-06-07 LAB — LIPASE, BLOOD: Lipase: 20 U/L (ref 11–51)

## 2015-06-07 NOTE — Clinical Social Work Note (Signed)
Clinical Social Work Assessment  Patient Details  Name: Chase Simmons MRN: [id number] Date of Birth: 10-10-1933  Date of referral:  06/07/15               Reason for consult:  Facility Placement                Permission sought to share information with:  Family Supports Permission granted to share information::  Yes, Verbal Permission Granted  Name::     [name], daughter [phone])   Housing/Transportation Living arrangements for the past 2 months:  [place] of Information:  Patient, Adult Children Patient Interpreter Needed:  None Criminal Activity/Legal Involvement Pertinent to Current Situation/Hospitalization:  No - Comment as needed Significant Relationships:  Adult Children, Other Family Members Lives with:  Self Do you feel safe going back to the place where you live?  Yes Need for family participation in patient care:  Yes (Comment)  Care giving concerns:  No care giving concerns identified.   Social Worker assessment / plan:  CSW met with pt and family to address consult for New SNF. CSW introduced herself and explained role of social work. CSW also explained the process of discharging to STR at [facility]. Pt's family is concerned that pt will not be agreeable to going to a SNF, which is recommended by PT. However, when CSW discussed this with pt, pt is agreeable to placement for STR. Pt's family shared that pt is not motivated to move around his home. Pt has a dog, who a friend is caring for. Pt's family also expressed an interested in a psych consult for medication management. Family assist pt to follow up outpatient, as it is more appropriate due to pt being on psych meds prescribed by PCP. CSW addressed the family support that is strong in the home, should continue at the facility to help pt stay motivated in order to keep his independence. CSW answered questions about placement and concerns for potential LTC. CSW initiated SNF search and will follow up  with bed offers. Pt's family's preference is [facility], which pt is agreeable to as well. CSW will continue to follow.   Employment status:  Retired Nurse, adult PT Recommendations:  [place] / Referral to community resources:  [place]  Patient/Family's Response to care:  Pt and family were appreciative of CSW support.   Patient/Family's Understanding of and Emotional Response to Diagnosis, Current Treatment, and Prognosis:  Pt understands that he would benefit from STR at SNF prior to returning home.   Emotional Assessment Appearance:  Appears stated age Attitude/Demeanor/Rapport:  Other (Appropriate) Affect (typically observed):  Pleasant Orientation:  Oriented to Self, Oriented to Place, Oriented to  Time, Oriented to Situation Alcohol / Substance use:  Never Used Psych involvement (Current and /or in the community):  No (Comment)  Discharge Needs  Concerns to be addressed:  Adjustment to Illness Readmission within the last 30 days:  No Current discharge risk:  Chronically ill Barriers to Discharge:  Continued Medical Work up   [employer], LCSW 06/07/2015, 2:46 PM

## 2015-06-07 NOTE — Progress Notes (Signed)
Pts daughter called to report that pts dog had been diagnosed with hepatitis A. Daughter reports that dog and pt eat food off of the same plate. Reported to on-call doctor. Pt to have hepatitis panel.

## 2015-06-07 NOTE — NC FL2 (Signed)
[place] LEVEL OF CARE SCREENING TOOL     IDENTIFICATION  Patient Name: Chase Simmons Birthdate: 1933-12-07 Sex: male Admission Date (Current Location): 06/04/2015  [place] and [place] Number:  Engineering geologist and Address:  [facility], [address] [place], [place] [address]      Provider Number: [id number]  Attending Physician Name and Address:  Dustin Flock, MD  Relative Name and Phone Number:       Current Level of Care: Hospital Recommended Level of Care: [place] Prior Approval Number:    Date Approved/Denied:   PASRR Number: [id number]  Discharge Plan: SNF    Current Diagnoses: Patient Active Problem List   Diagnosis Date Noted  . Periumbilical abdominal pain   . Pancreatitis, acute 06/04/2015  . Allergic rhinitis 05/14/2015  . Sleep disturbance 05/14/2015  . Non compliance with medical treatment 03/21/2015  . Knee pain, bilateral 03/16/2015  . Anxiety state 04/05/2014  . Atypical nevi 04/05/2014  . Chronic diarrhea 03/04/2013  . Tremor, essential 10/22/2012  . HLD (hyperlipidemia) 10/22/2012  . HTN (hypertension) 08/01/2012  . DM (diabetes mellitus), type 2 ([place]) 08/01/2012  . Low testosterone 08/01/2012    Orientation RESPIRATION BLADDER Height & Weight     Self, Time, Situation, Place  Normal Incontinent Weight: 240 lb (108.863 kg) Height:  5\' 11"  (180.3 cm)  BEHAVIORAL SYMPTOMS/MOOD NEUROLOGICAL BOWEL NUTRITION STATUS      Incontinent Diet (Heart Healty, Thin Liquids)  AMBULATORY STATUS COMMUNICATION OF NEEDS Skin   Limited Assist Verbally Normal                       Personal Care Assistance Level of Assistance  Bathing, Feeding, Dressing Bathing Assistance: Limited assistance Feeding assistance: Independent Dressing Assistance: Limited assistance     Functional Limitations Info  Sight, Hearing, Speech Sight Info: Adequate Hearing Info: Adequate Speech Info:  Adequate    SPECIAL CARE FACTORS FREQUENCY  PT (By licensed PT)     PT Frequency: 5              Contractures      Additional Factors Info  Code Status, Allergies, Psychotropic, Insulin Sliding Scale Code Status Info: Full Code Allergies Info: Latex Psychotropic Info: Medications Insulin Sliding Scale Info: 4x day       Current Medications (06/07/2015):  This is the current hospital active medication list Current Facility-Administered Medications  Medication Dose Route Frequency Provider Last Rate Last Dose  . 0.9 %  sodium chloride infusion   Intravenous Continuous Loletha Grayer, MD 75 mL/hr at 06/07/15 1055    . acetaminophen (TYLENOL) tablet 650 mg  650 mg Oral Q6H PRN Loletha Grayer, MD       Or  . acetaminophen (TYLENOL) suppository 650 mg  650 mg Rectal Q6H PRN Loletha Grayer, MD      . ALPRAZolam Duanne Moron) tablet 0.25 mg  0.25 mg Oral BID PRN Loletha Grayer, MD   0.25 mg at 06/06/15 1136  . carvedilol (COREG) tablet 12.5 mg  12.5 mg Oral BID WC Loletha Grayer, MD   12.5 mg at 06/07/15 0851  . enoxaparin (LOVENOX) injection 40 mg  40 mg Subcutaneous Q24H Loletha Grayer, MD   40 mg at 06/06/15 2127  . insulin aspart (novoLOG) injection 0-15 Units  0-15 Units Subcutaneous TID WC Bettey Costa, MD   2 Units at 06/07/15 1250  . insulin aspart (novoLOG) injection 0-5 Units  0-5 Units Subcutaneous QHS Sital Mody,  MD   0 Units at 06/05/15 2110  . lisinopril (PRINIVIL,ZESTRIL) tablet 10 mg  10 mg Oral Daily Loletha Grayer, MD   10 mg at 06/07/15 1056  . loratadine (CLARITIN) tablet 10 mg  10 mg Oral Daily Loletha Grayer, MD   10 mg at 06/07/15 1056  . morphine 2 MG/ML injection 2 mg  2 mg Intravenous Q3H PRN Loletha Grayer, MD   2 mg at 06/06/15 2233  . ondansetron (ZOFRAN) tablet 4 mg  4 mg Oral Q8H PRN Loletha Grayer, MD      . oxyCODONE (Oxy IR/ROXICODONE) immediate release tablet 5 mg  5 mg Oral Q4H PRN Loletha Grayer, MD   5 mg at 06/06/15 2127  . PARoxetine (PAXIL)  tablet 40 mg  40 mg Oral Daily Loletha Grayer, MD   40 mg at 06/07/15 1056  . primidone (MYSOLINE) tablet 50 mg  50 mg Oral BID Loletha Grayer, MD   50 mg at 06/07/15 1056  . QUEtiapine (SEROQUEL XR) 24 hr tablet 50 mg  50 mg Oral QHS Dustin Flock, MD   50 mg at 06/06/15 2127  . tiotropium (SPIRIVA) inhalation capsule 18 mcg  18 mcg Inhalation Daily Loletha Grayer, MD   18 mcg at 06/07/15 1056     Discharge Medications: Please see discharge summary for a list of discharge medications.  Relevant Imaging Results:  Relevant Lab Results:   Additional Information SSN:  [id number]  Darden Dates, LCSW

## 2015-06-07 NOTE — Clinical Social Work Placement (Signed)
   CLINICAL SOCIAL WORK PLACEMENT  NOTE  Date:  06/07/2015  Patient Details  Name: Chase Simmons MRN: [id number] Date of Birth: 1933-10-09  Clinical Social Work is seeking post-discharge placement for this patient at the [place] level of care (*CSW will initial, date and re-position this form in  chart as items are completed):  Yes   Patient/family provided with [place] Work Department's list of facilities offering this level of care within the geographic area requested by the patient (or if unable, by the patient's family).  Yes   Patient/family informed of their freedom to choose among providers that offer the needed level of care, that participate in Medicare, Medicaid or managed care program needed by the patient, have an available bed and are willing to accept the patient.  Yes   Patient/family informed of [facility]'s ownership interest in [facility] and [facility], as well as of the fact that they are under no obligation to receive care at these facilities.  PASRR submitted to EDS on 06/07/15     PASRR number received on 06/07/15     Existing PASRR number confirmed on       FL2 transmitted to all facilities in geographic area requested by pt/family on 06/07/15     FL2 transmitted to all facilities within larger geographic area on       Patient informed that his/her managed care company has contracts with or will negotiate with certain facilities, including the following:            Patient/family informed of bed offers received.  Patient chooses bed at       Physician recommends and patient chooses bed at      Patient to be transferred to   on  .  Patient to be transferred to facility by       Patient family notified on   of transfer.  Name of family member notified:        PHYSICIAN       Additional Comment:    _______________________________________________ Darden Dates, LCSW 06/07/2015, 2:41 PM

## 2015-06-07 NOTE — Progress Notes (Signed)
CC: Biliary pancreatitis Subjective: This patient with biliary pancreatitis he describes no pain today but is not completely oriented. Unable to obtain review of systems  Objective: Vital signs in last 24 hours: Temp:  [97.6 F (36.4 C)-99.8 F (37.7 C)] 99 F (37.2 C) (06/04 0438) Pulse Rate:  [61-120] 63 (06/04 0438) Resp:  [17-24] 24 (06/04 0438) BP: (154-214)/(67-164) 154/89 mmHg (06/04 0438) SpO2:  [96 %-97 %] 96 % (06/04 0438)    Intake/Output from previous day: 06/03 0701 - 06/04 0700 In: 2127.1 [P.O.:480; I.V.:1647.1] Out: 200 [Urine:200] Intake/Output this shift: Total I/O In: 265 [I.V.:265] Out: 0   Physical exam:  I'll signs are stable is afebrile. He is awake and alert. Chest shows bilateral wheezes cardiac is regular rate and rhythm abdomen is soft nondistended and nontender Show moderate edema and no tenderness no icterus no jaundice  Lab Results: CBC   Recent Labs  06/04/15 1354 06/05/15 0333  WBC 14.1* 10.4  HGB 13.2 12.5*  HCT 39.1* 36.5*  PLT 168 143*   BMET  Recent Labs  06/06/15 0616 06/07/15 0615  NA 133* 133*  K 3.9 3.6  CL 102 101  CO2 24 23  GLUCOSE 117* 88  BUN 20 18  CREATININE 0.95 0.90  CALCIUM 8.0* 7.8*   PT/INR No results for input(s): LABPROT, INR in the last 72 hours. ABG No results for input(s): PHART, HCO3 in the last 72 hours.  Invalid input(s): PCO2, PO2  Studies/Results: No results found.  Anti-infectives: Anti-infectives    Start     Dose/Rate Route Frequency Ordered Stop   06/04/15 1445  piperacillin-tazobactam (ZOSYN) IVPB 3.375 g     3.375 g 12.5 mL/hr over 240 Minutes Intravenous  Once 06/04/15 1438 06/04/15 1919      Assessment/Plan:  's patient with biliary pancreatitis he has multiple medical problems and is quite debilitated. I do not believe he is a surgical candidate at this point. His lipase is return to normal and can be sent to a skilled nursing facility at the discretion of the internal  medicine team. I have no surgical plans at this time in this patient whose biliary pancreatitis has resolved represents a very challenging surgical candidate because of his medical conditions. I can see him in the office in 2 weeks.  Florene Glen, MD, FACS  06/07/2015

## 2015-06-07 NOTE — Progress Notes (Signed)
[facility] [place] NAME: Chase Simmons    MR#:  [id number]  DATE OF BIRTH:  07/14/1933  SUBJECTIVE:  More awake abdominal pain now resolved  REVIEW OF SYSTEMS:    Review of Systems  Constitutional: Positive for malaise/fatigue. Negative for fever and chills.  HENT: Negative for ear discharge, ear pain, hearing loss, nosebleeds and sore throat.   Eyes: Negative for blurred vision and pain.  Respiratory: Negative for cough, hemoptysis, shortness of breath and wheezing.   Cardiovascular: Negative for chest pain, palpitations and leg swelling.  Gastrointestinal: Positive for abdominal pain. Negative for nausea, vomiting, diarrhea and blood in stool.  Genitourinary: Negative for dysuria.  Musculoskeletal: Negative for back pain.  Neurological: Positive for weakness. Negative for dizziness, tremors, speech change, focal weakness, seizures and headaches.  Endo/Heme/Allergies: Does not bruise/bleed easily.  Psychiatric/Behavioral: Negative for depression, suicidal ideas and hallucinations.    Tolerating Diet: Nothing by mouth      DRUG ALLERGIES:   Allergies  Allergen Reactions  . Latex Itching    VITALS:  Blood pressure 125/58, pulse 66, temperature 99 F (37.2 C), temperature source Oral, resp. rate 24, height 5\' 11"  (1.803 m), weight 108.863 kg (240 lb), SpO2 96 %.  PHYSICAL EXAMINATION:   Physical Exam  Constitutional: Chase Simmons is oriented to person, place, and time and well-developed, well-nourished, and in no distress. No distress.  HENT:  Head: Normocephalic.  Eyes: No scleral icterus.  Neck: Normal range of motion. Neck supple. No JVD present. No tracheal deviation present.  Cardiovascular: Normal rate, regular rhythm and normal heart sounds.  Exam reveals no gallop and no friction rub.   No murmur heard. Pulmonary/Chest: Effort normal and breath sounds normal. No respiratory distress. Chase Simmons has no wheezes. Chase Simmons has no rales. Chase Simmons  exhibits no tenderness.  Abdominal: Soft. Bowel sounds are normal. Chase Simmons exhibits no distension and no mass. There is no tenderness. There is no rebound and no guarding.  Musculoskeletal: Normal range of motion. Chase Simmons exhibits no edema.  Neurological: Chase Simmons is alert and oriented to person, place, and time.  Skin: Skin is warm. No rash noted. No erythema.  Psychiatric: Affect and judgment normal.      LABORATORY PANEL:   CBC  Recent Labs Lab 06/05/15 0333  WBC 10.4  HGB 12.5*  HCT 36.5*  PLT 143*   ------------------------------------------------------------------------------------------------------------------  Chemistries   Recent Labs Lab 06/07/15 0615  NA 133*  K 3.6  CL 101  CO2 23  GLUCOSE 88  BUN 18  CREATININE 0.90  CALCIUM 7.8*  AST 46*  ALT 85*  ALKPHOS 75  BILITOT 1.1   ------------------------------------------------------------------------------------------------------------------  Cardiac Enzymes  Recent Labs Lab 06/04/15 1354  TROPONINI 0.06*   ------------------------------------------------------------------------------------------------------------------  RADIOLOGY:  No results found.   ASSESSMENT AND PLAN:    82 year old male with a history of diabetes and COPD who presents with weakness and found to have acute pancreatitis.  1. Acute pancreatitis:  Likely due to passage of a gallstone Now resolved  Surgery feels high risk for surgery follow up outpatient for consideration cholecystectomy   2. Elevated liver function tests: Possibly due to gallstone. LFTs close to normal   3. Diabetes: Continue sliding scale insulin blood glucose labile  4. Essential hypertension: Continue lisinopril.  5. Depression: Continue Paxil.  6. Acute kidney injury: Improved with IV hydration    Discussed with daughter and son at bedside  CODE STATUS: full  TOTAL TIME TAKING CARE OF THIS PATIENT:88minutes.  Discharge to rehabilitation  tomorrow   Dustin Flock M.D on 06/07/2015 at 11:17 AM  Between 7am to 6pm - Pager - [phone] After 6pm go to [email or web address] - password [email or web address] [facility]  Office  [phone]  CC: Primary care physician; Rubbie Battiest, NP  Note: This dictation was prepared with Dragon dictation along with smaller phrase technology. Any transcriptional errors that result from this process are unintentional.

## 2015-06-08 LAB — GLUCOSE, CAPILLARY
GLUCOSE-CAPILLARY: 213 mg/dL — AB (ref 65–99)
Glucose-Capillary: 141 mg/dL — ABNORMAL HIGH (ref 65–99)
Glucose-Capillary: 189 mg/dL — ABNORMAL HIGH (ref 65–99)

## 2015-06-08 LAB — CREATININE, SERUM: CREATININE: 0.84 mg/dL (ref 0.61–1.24)

## 2015-06-08 MED ORDER — QUETIAPINE FUMARATE ER 50 MG PO TB24: 50.0000 mg | ORAL_TABLET | Freq: Every day | ORAL | Status: DC

## 2015-06-08 MED ORDER — ALPRAZOLAM 0.25 MG PO TABS: ORAL_TABLET | ORAL | Status: DC

## 2015-06-08 MED ORDER — IPRATROPIUM-ALBUTEROL 0.5-2.5 (3) MG/3ML IN SOLN: 3.0000 mL | Freq: Four times a day (QID) | RESPIRATORY_TRACT | Status: DC | PRN

## 2015-06-08 MED ORDER — IPRATROPIUM-ALBUTEROL 0.5-2.5 (3) MG/3ML IN SOLN: 3.0000 mL | Freq: Four times a day (QID) | RESPIRATORY_TRACT | Status: DC

## 2015-06-08 MED ORDER — FUROSEMIDE 10 MG/ML IJ SOLN
20.0000 mg | Freq: Once | INTRAMUSCULAR | Status: AC
  Administered 2015-06-08: 20 mg via INTRAVENOUS
  Filled 2015-06-08: qty 2

## 2015-06-08 MED ORDER — IPRATROPIUM-ALBUTEROL 0.5-2.5 (3) MG/3ML IN SOLN
3.0000 mL | Freq: Once | RESPIRATORY_TRACT | Status: AC
  Administered 2015-06-08: 3 mL via RESPIRATORY_TRACT
  Filled 2015-06-08: qty 3

## 2015-06-08 NOTE — Progress Notes (Addendum)
Physical Therapy Treatment Patient Details Name: Chase Simmons MRN: [id number] DOB: 08/31/1933 Today's Date: 06/08/2015    History of Present Illness Pt admitted for complaints of abdominal pain x 2 days. Pt now with acute pancreatitis with pending surgical plans. Date unknown so was advised to perform evaluation per RN. Pt with history of COPD, DM, HTN, and bladder cancer.     PT Comments    Pt is making good progress towards goals. Pt with no surgical plans at this time. Pt still confused, however able to be re-oriented. Pt participated in there-ex/ambulation. Cues for correct use of RW. Pt appears more motivated to participate with therapy.  Follow Up Recommendations  SNF     Equipment Recommendations       Recommendations for Other Services       Precautions / Restrictions Precautions Precautions: Fall Restrictions Weight Bearing Restrictions: No    Mobility  Bed Mobility Overal bed mobility: Needs Assistance Bed Mobility: Supine to Sit     Supine to sit: Mod assist     General bed mobility comments: assist for bed mobility. Safe technique performed, however needs significant assist for trunk mobility. Once seated at EOB, pt needs assist for scooting out towards EOB.   Transfers Overall transfer level: Needs assistance Equipment used: Rolling walker (2 wheeled) Transfers: Sit to/from Stand Sit to Stand: Min assist         General transfer comment: assist for transfer; pt slightly impulsive. Needs cues for safe technique. RW used. Once standing, cued for correct technique using rw  Ambulation/Gait Ambulation/Gait assistance: Min assist Ambulation Distance (Feet): 3 Feet Assistive device: Rolling walker (2 wheeled) Gait Pattern/deviations: Step-to pattern     General Gait Details: ambulated to recliner with heavy cues for correct use of rw. Slow step to gait pattern performed   Stairs            Wheelchair Mobility    Modified Rankin (Stroke  Patients Only)       Balance                                    Cognition Arousal/Alertness: Awake/alert Behavior During Therapy: WFL for tasks assessed/performed Overall Cognitive Status: Impaired/Different from baseline                      Exercises Other Exercises Other Exercises: supine ther-ex performed on B LE including ankle pumps, hip add squeezes, and hip abd/add. All ther-ex performed x 10 reps with min assist and safe technique    General Comments        Pertinent Vitals/Pain Pain Assessment: No/denies pain    Home Living                      Prior Function            PT Goals (current goals can now be found in the care plan section) Acute Rehab PT Goals Patient Stated Goal: to get stronger and see his dog PT Goal Formulation: With patient Time For Goal Achievement: 06/19/15 Potential to Achieve Goals: Good Progress towards PT goals: Progressing toward goals    Frequency  Min 2X/week    PT Plan Current plan remains appropriate    Co-evaluation             End of Session Equipment Utilized During Treatment: Gait belt Activity Tolerance: Patient tolerated treatment well  Patient left: in chair;with chair alarm set     Time: 1006-1020 PT Time Calculation (min) (ACUTE ONLY): 14 min  Charges:  $Therapeutic Exercise: 8-22 mins                    G Codes:      Chase Simmons 16-Jun-2015, 11:02 AM  Chase Simmons, PT, DPT [phone]

## 2015-06-08 NOTE — Discharge Summary (Signed)
Chase Simmons, 82 y.o., DOB 10/30/1933, MRN [id number]. Admission date: 06/04/2015 Discharge Date 06/08/2015 Primary MD Rubbie Battiest, NP Admitting Physician Loletha Grayer, MD  Admission Diagnosis  Periumbilical abdominal pain [R10.33] Acute pancreatitis, unspecified pancreatitis type [K85.9]  Discharge Diagnosis   Active Problems: Acute gallstone pancreatitis History of bladder cancer Diabetes type 2 COPD Allergies Hypertension Hyperlipidemia Urinary incontinence Essential tremors           [name] is a 82 y.o. male with a known history of COPD, diabetes, hypertension, bladder cancer. He presents with 2 days worth of abdominal pain. Patient came to the emergency room and was noted to have acute pancreatitis along with liver function tests being elevated. CT of the abdomen showed pancreatitis. Also gallbladder cholelithiasis was noted as well. Patient underwent ultrasound of the abdomen which confirmed gallstones. Patient was kept nothing by mouth supportive care was provided. He was seen in consultation by surgery who felt that he was very high risk for  cholecystectomy. Patient's abdominal pain is now resolved. His lipase is normalized. His LFTs have trended down close to normal. Patient will be seen by surgery as outpatient for possible gallbladder surgery. He is very weak and deconditioned in need of rehabilitation which is arranged.         Consults  general surgery  Significant Tests:  See full reports for all details      Ct Abdomen Pelvis W Contrast  06/04/2015  CLINICAL DATA:  Periumbilical/ midline pain since yesterday. Nausea and dry heaves. EXAM: CT ABDOMEN AND PELVIS WITH CONTRAST TECHNIQUE: Multidetector CT imaging of the abdomen and pelvis was performed using the standard protocol following bolus administration of intravenous contrast. CONTRAST:  79mL ISOVUE-300 IOPAMIDOL (ISOVUE-300) INJECTION 61% COMPARISON:  None. FINDINGS: Normal lung  bases. No free air. There is increased attenuation in the mesenteric fat, extending into the right greater than left pericolic gutters, with a small amount of free fluid in the pelvis. The increased attenuation of fat is a little more focal around the pancreatic head. There may be minimal increased attenuation adjacent to the gallbladder as well. No fluid collections. The liver and portal vein are normal. There is cholelithiasis with mild prominence of the gallbladder wall and suggestion of minimal adjacent increased fat stranding adjacent to the gallbladder. The spleen and adrenal glands are normal. There is a small renal cyst on the right. No hydronephrosis or acute perinephric stranding. No ureterectasis or ureteral stones. Atherosclerosis is seen in the non aneurysmal aorta. No adenopathy. The stomach is normal in appearance. There is a duodenal diverticulum. The small bowel is otherwise unremarkable. Colonic diverticulosis is seen without diverticulitis. No convincing evidence of colitis. The visualized appendix is normal. Hernia mesh is associated with the ventral wall. The pelvis contains a small amount of free fluid. No adenopathy. The bladder is normal. The prostate and seminal vesicles are unremarkable. Degenerative changes are seen within the spine. No acute bony abnormalities identified. IMPRESSION: 1. There is increased attenuation in the fat of the mesentery extending down the right greater the left pericolic gutters with a small amount of fluid in the pelvis. While there is no definitive discrete source, I suspect this is arising from the pancreas and/or the gallbladder. The patient's elevated lipase suggests the possibility of pancreatitis. The patient also has cholelithiasis with a borderline gallbladder wall thickness. An ultrasound could better evaluate the gallbladder as clinically warranted. Electronically Signed   By: Dorise Bullion III M.D   On: 06/04/2015 16:29  US Abdomen Limited  Ruq  06/04/2015  CLINICAL DATA:  Elevated LFTs.  Abdominal pain. EXAM: US ABDOMEN LIMITED - RIGHT UPPER QUADRANT COMPARISON:  CT scan from earlier today FINDINGS: Gallbladder: Sludge and stones are seen in the gallbladder. The gallbladder wall measures 4.2 mm. No Murphy's sign reported. No pericholecystic fluid. Common bile duct: Diameter: 6.6 mm, within normal limits for age. Liver: The liver was difficult to penetrate limiting evaluation. No focal masses identified. IMPRESSION: Cholelithiasis with mild wall thickening but no reported Murphy's sign or pericholecystic fluid. If there is continued concern for acute cholecystitis, a HIDA scan could further evaluate. Electronically Signed   By: Dorise Bullion III M.D   On: 06/04/2015 18:06       Today   Subjective:   [name]  Feeling better no abdominal pain  Objective:   Blood pressure 154/68, pulse 69, temperature 97.9 F (36.6 C), temperature source Oral, resp. rate 20, height 5\' 11"  (1.803 m), weight 108.863 kg (240 lb), SpO2 95 %.  .  Intake/Output Summary (Last 24 hours) at 06/08/15 0934 Last data filed at 06/08/15 0613  Gross per 24 hour  Intake   1249 ml  Output 1601.46 ml  Net -352.46 ml    Exam VITAL SIGNS: Blood pressure 154/68, pulse 69, temperature 97.9 F (36.6 C), temperature source Oral, resp. rate 20, height 5\' 11"  (1.803 m), weight 108.863 kg (240 lb), SpO2 95 %.  GENERAL:  82 y.o.-year-old patient lying in the bed with no acute distress.  EYES: Pupils equal, round, reactive to light and accommodation. No scleral icterus. Extraocular muscles intact.  HEENT: Head atraumatic, normocephalic. Oropharynx and nasopharynx clear.  NECK:  Supple, no jugular venous distention. No thyroid enlargement, no tenderness.  LUNGS: Normal breath sounds bilaterally, no wheezing, rales,rhonchi or crepitation. No use of accessory muscles of respiration.  CARDIOVASCULAR: S1, S2 normal. No murmurs, rubs, or gallops.  ABDOMEN: Soft,  nontender, nondistended. Bowel sounds present. No organomegaly or mass.  EXTREMITIES: No pedal edema, cyanosis, or clubbing.  NEUROLOGIC: Cranial nerves II through XII are intact. Muscle strength 5/5 in all extremities. Sensation intact. Gait not checked.  PSYCHIATRIC: The patient is alert and oriented x 3.  SKIN: No obvious rash, lesion, or ulcer.   Data Review     CBC w Diff: Lab Results  Component Value Date   WBC 10.4 06/05/2015   WBC 5.0 01/08/2014   HGB 12.5* 06/05/2015   HGB 13.7 01/08/2014   HCT 36.5* 06/05/2015   HCT 41.0 01/08/2014   PLT 143* 06/05/2015   PLT 165 01/08/2014   LYMPHOPCT 25.2 01/08/2015   LYMPHOPCT 19.5 01/08/2014   MONOPCT 9.6 01/08/2015   MONOPCT 18.6 01/08/2014   EOSPCT 4.3 01/08/2015   EOSPCT 2.7 01/08/2014   BASOPCT 0.7 01/08/2015   BASOPCT 0.9 01/08/2014   CMP: Lab Results  Component Value Date   NA 133* 06/07/2015   NA 137 01/08/2014   K 3.6 06/07/2015   K 4.4 01/08/2014   CL 101 06/07/2015   CL 102 01/08/2014   CO2 23 06/07/2015   CO2 26 01/08/2014   BUN 18 06/07/2015   BUN 18 01/08/2014   CREATININE 0.84 06/08/2015   CREATININE 1.13 01/08/2014   PROT 5.7* 06/07/2015   ALBUMIN 2.7* 06/07/2015   BILITOT 1.1 06/07/2015   ALKPHOS 75 06/07/2015   AST 46* 06/07/2015   ALT 85* 06/07/2015  .  Micro Results No results found for this or any previous visit (from the past 240 hour(s)).  Code Status Orders        Start     Ordered   06/04/15 2013  Full code   Continuous     06/04/15 2013    Code Status History    Date Active Date Inactive Code Status Order ID Comments User Context   This patient has a current code status but no historical code status.    Advance Directive Documentation        Most Recent Value   Type of Advance Directive  Living will   Pre-existing out of facility DNR order (yellow form or pink MOST form)     "MOST" Form in Place?            Follow-up Information    Follow up with [facility] .   Specialty:  [place] information:   [address] [place] [phone]      Follow up with Phoebe Perch, MD In 2 weeks.   Specialty:  Surgery   Contact information:   [address] [place] [address] [phone]       Follow up with Rubbie Battiest, NP In 2 weeks.   Specialty:  Gerontology   Contact information:   [id number] [place] [address] [phone]       Discharge Medications     Medication List    TAKE these medications        ALPRAZolam 0.25 MG tablet  Commonly known as:  XANAX  take 1 tablet by mouth twice a day if needed for anxiety     atorvastatin 80 MG tablet  Commonly known as:  LIPITOR  TAKE 1 TABLET ONE TIME DAILY     carvedilol 12.5 MG tablet  Commonly known as:  COREG  TAKE 1 TABLET  2 (TWO) TIMES DAILY WITH A MEAL.     fexofenadine 180 MG tablet  Commonly known as:  ALLEGRA  Take 180 mg by mouth daily as needed for allergies or rhinitis.     glimepiride 4 MG tablet  Commonly known as:  AMARYL  Take 1 tablet (4 mg total) by mouth daily with breakfast.     hydrochlorothiazide 12.5 MG tablet  Commonly known as:  HYDRODIURIL  Take 1 tablet (12.5 mg total) by mouth daily.     ipratropium-albuterol 0.5-2.5 (3) MG/3ML Soln  Commonly known as:  DUONEB  Take 3 mLs by nebulization every 6 (six) hours as needed.     lisinopril 10 MG tablet  Commonly known as:  PRINIVIL,ZESTRIL  TAKE 1 TABLET EVERY DAY     meclizine 25 MG tablet  Commonly known as:  ANTIVERT  Take 1 tablet (25 mg total) by mouth 3 (three) times daily as needed for dizziness or nausea.     metFORMIN 1000 MG tablet  Commonly known as:  GLUCOPHAGE  TAKE 1 TABLET TWICE DAILY  WITH  FOOD     ondansetron 4 MG tablet  Commonly known as:  ZOFRAN  Take 1 tablet (4 mg total) by mouth every 8 (eight) hours as needed for nausea or vomiting.     PARoxetine 40 MG  tablet  Commonly known as:  PAXIL  Take 1 tablet (40 mg total) by mouth daily.     primidone 50 MG tablet  Commonly known as:  MYSOLINE  TAKE 1 TABLET TWICE DAILY     QUEtiapine 50 MG Tb24 24 hr tablet  Commonly known as:  SEROQUEL XR  Take 1 tablet (50 mg total) by mouth at bedtime.     tiotropium 18 MCG inhalation capsule  Commonly known as:  SPIRIVA  Place 1 capsule (18 mcg total) into inhaler and inhale daily.     VITAMIN D-1000 MAX ST 1000 units tablet  Generic drug:  Cholecalciferol  Take 1,000 mg by mouth daily.           Total Time in preparing paper work, data evaluation and todays exam - 35 minutes  Dustin Flock M.D on 06/08/2015 at 9:34 AM  [facility]   Office  [phone]

## 2015-06-08 NOTE — Progress Notes (Signed)
Alert and oriented. Vital signs stable . No signs of acute distress. Report Given to Yaakov Guthrie at [employer]. No other issues noted at this time.

## 2015-06-08 NOTE — Discharge Instructions (Signed)
°  DIET:  Low fat diet  DISCHARGE CONDITION:  Stable  ACTIVITY:  Activity as tolerated  OXYGEN:  Home Oxygen: No.   Oxygen Delivery: room air  DISCHARGE LOCATION:  snf  ADDITIONAL DISCHARGE INSTRUCTION:   If you experience worsening of your admission symptoms, develop shortness of breath, life threatening emergency, suicidal or homicidal thoughts you must seek medical attention immediately by calling 911 or calling your MD immediately  if symptoms less severe.  You Must read complete instructions/literature along with all the possible adverse reactions/side effects for all the Medicines you take and that have been prescribed to you. Take any new Medicines after you have completely understood and accpet all the possible adverse reactions/side effects.   Please note  You were cared for by a hospitalist during your hospital stay. If you have any questions about your discharge medications or the care you received while you were in the hospital after you are discharged, you can call the unit and asked to speak with the hospitalist on call if the hospitalist that took care of you is not available. Once you are discharged, your primary care physician will handle any further medical issues. Please note that NO REFILLS for any discharge medications will be authorized once you are discharged, as it is imperative that you return to your primary care physician (or establish a relationship with a primary care physician if you do not have one) for your aftercare needs so that they can reassess your need for medications and monitor your lab values.

## 2015-06-08 NOTE — Clinical Social Work Note (Signed)
Patient had received bed offer from [facility] which was the facility they preferred. Bed offer extended and message sent to [facility] that patient was coming to them today as MD was ready for discharge. Florentina Jenny at [facility] then messaged CSW to say that they were not taking any admissions today or possibly even tomorrow. CSW informed patient's daughter and informed her of the other offers and even offered to send patient's referral to [place] sister facility: [facility]. Patient's daughter decided upon [facility] after speaking with her brother. Broadus John at [facility] is aware and discharge information sent. Patient's daughter wishes to have patient transport via EMS. CSW awaiting auth from [facility].  Shela Leff MSW,LCSW [phone]

## 2015-06-08 NOTE — Clinical Social Work Placement (Signed)
   CLINICAL SOCIAL WORK PLACEMENT  NOTE  Date:  06/08/2015  Patient Details  Name: Chase Simmons MRN: [id number] Date of Birth: Aug 26, 1933  Clinical Social Work is seeking post-discharge placement for this patient at the National level of care (*CSW will initial, date and re-position this form in  chart as items are completed):  Yes   Patient/family provided with [facility] Work Department's list of facilities offering this level of care within the geographic area requested by the patient (or if unable, by the patient's family).  Yes   Patient/family informed of their freedom to choose among providers that offer the needed level of care, that participate in Medicare, Medicaid or managed care program needed by the patient, have an available bed and are willing to accept the patient.  Yes   Patient/family informed of [facility]'s ownership interest in [facility] and [facility], as well as of the fact that they are under no obligation to receive care at these facilities.  PASRR submitted to EDS on 06/07/15     PASRR number received on 06/07/15     Existing PASRR number confirmed on       FL2 transmitted to all facilities in geographic area requested by pt/family on 06/07/15     FL2 transmitted to all facilities within larger geographic area on       Patient informed that his/her managed care company has contracts with or will negotiate with certain facilities, including the following:        Yes   Patient/family informed of bed offers received.  Patient chooses bed at  (Initially at [facility] but then [facility] could not accept admissions today so family chose [facility])     Physician recommends and patient chooses bed at  [facility])    Patient to be transferred to  ([facility]) on 06/08/15.  Patient to be transferred to facility by  (EMS)     Patient family notified on 06/08/15 of transfer.  Name of family member notified:    (Daughter)     PHYSICIAN       Additional Comment:    _______________________________________________ Shela Leff, LCSW 06/08/2015, 10:36 AM

## 2015-06-09 LAB — HEPATITIS PANEL, ACUTE
HCV Ab: <0.1 s/co ratio (ref 0.0–0.9)
HEP A IGM: NEGATIVE
HEP B C IGM: NEGATIVE
Hepatitis B Surface Ag: NEGATIVE

## 2015-06-10 ENCOUNTER — Other Ambulatory Visit: Payer: Self-pay | Admitting: Nurse Practitioner

## 2015-06-12 ENCOUNTER — Ambulatory Visit: Payer: Self-pay | Admitting: Family Medicine

## 2015-06-17 ENCOUNTER — Other Ambulatory Visit: Payer: Self-pay

## 2015-06-17 DIAGNOSIS — F419 Anxiety disorder, unspecified: Secondary | ICD-10-CM | POA: Insufficient documentation

## 2015-06-17 DIAGNOSIS — C679 Malignant neoplasm of bladder, unspecified: Secondary | ICD-10-CM | POA: Insufficient documentation

## 2015-06-18 ENCOUNTER — Emergency Department: Payer: Commercial Managed Care - HMO

## 2015-06-18 ENCOUNTER — Encounter: Payer: Self-pay | Admitting: Intensive Care

## 2015-06-18 ENCOUNTER — Inpatient Hospital Stay
Admission: EM | Admit: 2015-06-18 | Discharge: 2015-06-22 | DRG: 440 | Disposition: A | Discharge status: Skilled Nursing Facility | Payer: Commercial Managed Care - HMO | Attending: Internal Medicine | Admitting: Internal Medicine

## 2015-06-18 DIAGNOSIS — R4182 Altered mental status, unspecified: Secondary | ICD-10-CM

## 2015-06-18 DIAGNOSIS — E785 Hyperlipidemia, unspecified: Secondary | ICD-10-CM | POA: Diagnosis present

## 2015-06-18 DIAGNOSIS — R0600 Dyspnea, unspecified: Secondary | ICD-10-CM | POA: Diagnosis not present

## 2015-06-18 DIAGNOSIS — O2230 Deep phlebothrombosis in pregnancy, unspecified trimester: Secondary | ICD-10-CM

## 2015-06-18 DIAGNOSIS — Z8261 Family history of arthritis: Secondary | ICD-10-CM

## 2015-06-18 DIAGNOSIS — R4020 Unspecified coma: Secondary | ICD-10-CM

## 2015-06-18 DIAGNOSIS — K8020 Calculus of gallbladder without cholecystitis without obstruction: Secondary | ICD-10-CM | POA: Diagnosis present

## 2015-06-18 DIAGNOSIS — I959 Hypotension, unspecified: Secondary | ICD-10-CM | POA: Diagnosis not present

## 2015-06-18 DIAGNOSIS — J449 Chronic obstructive pulmonary disease, unspecified: Secondary | ICD-10-CM | POA: Diagnosis present

## 2015-06-18 DIAGNOSIS — Z7984 Long term (current) use of oral hypoglycemic drugs: Secondary | ICD-10-CM

## 2015-06-18 DIAGNOSIS — F419 Anxiety disorder, unspecified: Secondary | ICD-10-CM | POA: Diagnosis present

## 2015-06-18 DIAGNOSIS — K851 Biliary acute pancreatitis without necrosis or infection: Principal | ICD-10-CM | POA: Diagnosis present

## 2015-06-18 DIAGNOSIS — R109 Unspecified abdominal pain: Secondary | ICD-10-CM

## 2015-06-18 DIAGNOSIS — K5900 Constipation, unspecified: Secondary | ICD-10-CM | POA: Diagnosis present

## 2015-06-18 DIAGNOSIS — R0902 Hypoxemia: Secondary | ICD-10-CM | POA: Diagnosis not present

## 2015-06-18 DIAGNOSIS — R001 Bradycardia, unspecified: Secondary | ICD-10-CM | POA: Diagnosis not present

## 2015-06-18 DIAGNOSIS — Z79899 Other long term (current) drug therapy: Secondary | ICD-10-CM

## 2015-06-18 DIAGNOSIS — M79606 Pain in leg, unspecified: Secondary | ICD-10-CM | POA: Diagnosis present

## 2015-06-18 DIAGNOSIS — Z6831 Body mass index (BMI) 31.0-31.9, adult: Secondary | ICD-10-CM

## 2015-06-18 DIAGNOSIS — Z87891 Personal history of nicotine dependence: Secondary | ICD-10-CM

## 2015-06-18 DIAGNOSIS — E669 Obesity, unspecified: Secondary | ICD-10-CM | POA: Diagnosis present

## 2015-06-18 DIAGNOSIS — Z8551 Personal history of malignant neoplasm of bladder: Secondary | ICD-10-CM

## 2015-06-18 DIAGNOSIS — N179 Acute kidney failure, unspecified: Secondary | ICD-10-CM

## 2015-06-18 DIAGNOSIS — E119 Type 2 diabetes mellitus without complications: Secondary | ICD-10-CM | POA: Diagnosis present

## 2015-06-18 DIAGNOSIS — F329 Major depressive disorder, single episode, unspecified: Secondary | ICD-10-CM | POA: Diagnosis present

## 2015-06-18 DIAGNOSIS — I10 Essential (primary) hypertension: Secondary | ICD-10-CM | POA: Diagnosis present

## 2015-06-18 DIAGNOSIS — F0390 Unspecified dementia without behavioral disturbance: Secondary | ICD-10-CM | POA: Diagnosis present

## 2015-06-18 LAB — COMPREHENSIVE METABOLIC PANEL
ALK PHOS: 67 U/L (ref 38–126)
ALT: 33 U/L (ref 17–63)
ANION GAP: 12 (ref 5–15)
AST: 41 U/L (ref 15–41)
Albumin: 3 g/dL — ABNORMAL LOW (ref 3.5–5.0)
BILIRUBIN TOTAL: 0.8 mg/dL (ref 0.3–1.2)
BUN: 19 mg/dL (ref 6–20)
CALCIUM: 8.5 mg/dL — AB (ref 8.9–10.3)
CO2: 23 mmol/L (ref 22–32)
CREATININE: 1.27 mg/dL — AB (ref 0.61–1.24)
Chloride: 97 mmol/L — ABNORMAL LOW (ref 101–111)
GFR, EST AFRICAN AMERICAN: 60 mL/min — AB (ref >60)
GFR, EST NON AFRICAN AMERICAN: 52 mL/min — AB (ref >60)
Glucose, Bld: 199 mg/dL — ABNORMAL HIGH (ref 65–99)
Potassium: 3.6 mmol/L (ref 3.5–5.1)
SODIUM: 132 mmol/L — AB (ref 135–145)
TOTAL PROTEIN: 6.5 g/dL (ref 6.5–8.1)

## 2015-06-18 LAB — TROPONIN I: Troponin I: 0.05 ng/mL — ABNORMAL HIGH (ref <0.031)

## 2015-06-18 LAB — CBC
HCT: 35.5 % — ABNORMAL LOW (ref 40.0–52.0)
HEMOGLOBIN: 12 g/dL — AB (ref 13.0–18.0)
MCH: 33.1 pg (ref 26.0–34.0)
MCHC: 33.7 g/dL (ref 32.0–36.0)
MCV: 98.2 fL (ref 80.0–100.0)
PLATELETS: 250 10*3/uL (ref 150–440)
RBC: 3.62 MIL/uL — AB (ref 4.40–5.90)
RDW: 13 % (ref 11.5–14.5)
WBC: 13 10*3/uL — AB (ref 3.8–10.6)

## 2015-06-18 LAB — URINALYSIS COMPLETE WITH MICROSCOPIC (ARMC ONLY)
BILIRUBIN URINE: NEGATIVE
Bacteria, UA: NONE SEEN
Glucose, UA: 50 mg/dL — AB
Hgb urine dipstick: NEGATIVE
KETONES UR: NEGATIVE mg/dL
LEUKOCYTES UA: NEGATIVE
NITRITE: NEGATIVE
PH: 5 (ref 5.0–8.0)
PROTEIN: NEGATIVE mg/dL
SPECIFIC GRAVITY, URINE: 1.019 (ref 1.005–1.030)

## 2015-06-18 LAB — LIPASE, BLOOD: Lipase: 21 U/L (ref 11–51)

## 2015-06-18 LAB — LACTIC ACID, PLASMA: LACTIC ACID, VENOUS: 1.1 mmol/L (ref 0.5–2.0)

## 2015-06-18 MED ORDER — HYDROCHLOROTHIAZIDE 25 MG PO TABS
12.5000 mg | ORAL_TABLET | Freq: Every day | ORAL | Status: DC
  Administered 2015-06-20: 12.5 mg via ORAL
  Filled 2015-06-18: qty 1

## 2015-06-18 MED ORDER — SODIUM CHLORIDE 0.9 % IV BOLUS (SEPSIS)
1000.0000 mL | Freq: Once | INTRAVENOUS | Status: AC
  Administered 2015-06-18: 1000 mL via INTRAVENOUS

## 2015-06-18 MED ORDER — TIOTROPIUM BROMIDE MONOHYDRATE 18 MCG IN CAPS
18.0000 ug | ORAL_CAPSULE | Freq: Every day | RESPIRATORY_TRACT | Status: DC
  Administered 2015-06-20 – 2015-06-22 (×2): 18 ug via RESPIRATORY_TRACT
  Filled 2015-06-18 (×2): qty 5

## 2015-06-18 MED ORDER — PAROXETINE HCL 20 MG PO TABS
40.0000 mg | ORAL_TABLET | Freq: Every day | ORAL | Status: DC
  Administered 2015-06-20 – 2015-06-22 (×3): 40 mg via ORAL
  Filled 2015-06-18 (×4): qty 2

## 2015-06-18 MED ORDER — IOPAMIDOL (ISOVUE-300) INJECTION 61%
100.0000 mL | Freq: Once | INTRAVENOUS | Status: AC | PRN
  Administered 2015-06-18: 100 mL via INTRAVENOUS

## 2015-06-18 MED ORDER — ALPRAZOLAM 0.25 MG PO TABS
0.2500 mg | ORAL_TABLET | Freq: Three times a day (TID) | ORAL | Status: DC | PRN
  Administered 2015-06-19 – 2015-06-20 (×3): 0.25 mg via ORAL
  Filled 2015-06-18 (×2): qty 1

## 2015-06-18 MED ORDER — IPRATROPIUM-ALBUTEROL 0.5-2.5 (3) MG/3ML IN SOLN
3.0000 mL | Freq: Four times a day (QID) | RESPIRATORY_TRACT | Status: DC | PRN
  Administered 2015-06-20: 3 mL via RESPIRATORY_TRACT
  Filled 2015-06-18: qty 3

## 2015-06-18 MED ORDER — PRIMIDONE 50 MG PO TABS
50.0000 mg | ORAL_TABLET | Freq: Two times a day (BID) | ORAL | Status: DC
  Administered 2015-06-19 – 2015-06-22 (×6): 50 mg via ORAL
  Filled 2015-06-18 (×9): qty 1

## 2015-06-18 MED ORDER — PIPERACILLIN-TAZOBACTAM 3.375 G IVPB 30 MIN
3.3750 g | Freq: Three times a day (TID) | INTRAVENOUS | Status: DC
  Administered 2015-06-19 – 2015-06-22 (×11): 3.375 g via INTRAVENOUS
  Filled 2015-06-18 (×16): qty 50

## 2015-06-18 MED ORDER — VANCOMYCIN HCL IN DEXTROSE 1-5 GM/200ML-% IV SOLN
1000.0000 mg | Freq: Once | INTRAVENOUS | Status: AC
  Administered 2015-06-18: 1000 mg via INTRAVENOUS
  Filled 2015-06-18: qty 200

## 2015-06-18 MED ORDER — LORAZEPAM 2 MG/ML IJ SOLN
0.5000 mg | Freq: Once | INTRAMUSCULAR | Status: AC
  Administered 2015-06-18: 0.5 mg via INTRAVENOUS

## 2015-06-18 MED ORDER — VITAMIN D 1000 UNITS PO TABS
1000.0000 [IU] | ORAL_TABLET | Freq: Every day | ORAL | Status: DC
  Administered 2015-06-20 – 2015-06-22 (×3): 1000 [IU] via ORAL
  Filled 2015-06-18 (×3): qty 1

## 2015-06-18 MED ORDER — PIPERACILLIN-TAZOBACTAM 3.375 G IVPB 30 MIN
3.3750 g | Freq: Once | INTRAVENOUS | Status: AC
  Administered 2015-06-18: 3.375 g via INTRAVENOUS
  Filled 2015-06-18: qty 50

## 2015-06-18 MED ORDER — CARVEDILOL 3.125 MG PO TABS
12.5000 mg | ORAL_TABLET | Freq: Two times a day (BID) | ORAL | Status: DC
  Administered 2015-06-19 – 2015-06-20 (×3): 12.5 mg via ORAL
  Filled 2015-06-18 (×3): qty 2

## 2015-06-18 MED ORDER — HALOPERIDOL LACTATE 5 MG/ML IJ SOLN
5.0000 mg | Freq: Four times a day (QID) | INTRAMUSCULAR | Status: DC | PRN
Start: 2015-06-18 — End: 2015-06-21

## 2015-06-18 MED ORDER — LORAZEPAM 2 MG/ML IJ SOLN
INTRAMUSCULAR | Status: AC
  Filled 2015-06-18: qty 1

## 2015-06-18 MED ORDER — LORAZEPAM 2 MG/ML IJ SOLN
0.5000 mg | Freq: Once | INTRAMUSCULAR | Status: AC
  Administered 2015-06-18: 0.5 mg via INTRAVENOUS
  Filled 2015-06-18: qty 1

## 2015-06-18 MED ORDER — INSULIN ASPART 100 UNIT/ML ~~LOC~~ SOLN
0.0000 [IU] | Freq: Three times a day (TID) | SUBCUTANEOUS | Status: DC
  Administered 2015-06-20 (×3): 2 [IU] via SUBCUTANEOUS
  Administered 2015-06-21 – 2015-06-22 (×4): 3 [IU] via SUBCUTANEOUS
  Administered 2015-06-22: 2 [IU] via SUBCUTANEOUS
  Filled 2015-06-18 (×2): qty 2
  Filled 2015-06-18: qty 3
  Filled 2015-06-18 (×2): qty 2
  Filled 2015-06-18 (×2): qty 3

## 2015-06-18 MED ORDER — LISINOPRIL 10 MG PO TABS
10.0000 mg | ORAL_TABLET | Freq: Every day | ORAL | Status: DC
  Administered 2015-06-20: 10 mg via ORAL
  Filled 2015-06-18: qty 1

## 2015-06-18 MED ORDER — ATORVASTATIN CALCIUM 20 MG PO TABS
80.0000 mg | ORAL_TABLET | Freq: Every day | ORAL | Status: DC
  Administered 2015-06-19 – 2015-06-20 (×2): 80 mg via ORAL
  Filled 2015-06-18 (×2): qty 4

## 2015-06-18 MED ORDER — QUETIAPINE FUMARATE ER 50 MG PO TB24
50.0000 mg | ORAL_TABLET | Freq: Every day | ORAL | Status: DC
  Administered 2015-06-19 – 2015-06-20 (×2): 50 mg via ORAL
  Filled 2015-06-18 (×4): qty 1

## 2015-06-18 MED ORDER — LORATADINE 10 MG PO TABS
10.0000 mg | ORAL_TABLET | Freq: Every day | ORAL | Status: DC
  Administered 2015-06-20: 10 mg via ORAL
  Filled 2015-06-18: qty 1

## 2015-06-18 MED ORDER — ASPIRIN 300 MG RE SUPP
300.0000 mg | Freq: Once | RECTAL | Status: AC
  Administered 2015-06-18: 300 mg via RECTAL
  Filled 2015-06-18: qty 1

## 2015-06-18 NOTE — ED Notes (Signed)
Patient transported to CT 

## 2015-06-18 NOTE — ED Notes (Signed)
Patient arrived by EMS from [facility]. Pt diagnosed with UTI X2 days ago , staff reports increased confusion and pt is normally A&O X3. Facility gave ativan before EMS arrival.

## 2015-06-18 NOTE — H&P (Signed)
Chase Simmons NAME: Chase Simmons    MR#:  [id number]  DATE OF BIRTH:  [birth year]  DATE OF ADMISSION:  06/18/2015  PRIMARY CARE PHYSICIAN: Chase Battiest, NP   REQUESTING/REFERRING PHYSICIAN: Emergency room, Dr. Reita Simmons  CHIEF COMPLAINT:   Chief Complaint  Patient presents with  . Altered Mental Status    HISTORY OF PRESENT ILLNESS:  Chase Simmons  is a 82 y.o. male with a known history of Past medical history significant for bladder cancer, COPD does not require submental oxygen, hypertension, non-insulin dependent diabetes mellitus was recently admitted to our service for a gallstone pancreatitis. Patient had some elevated liver function tests and surgery was seen the patient but was felt to be a poor surgical candidate. He was discharged June [facility] and was supposed to follow-up with general surgery for a possible cholecystectomy. Patient was thought to a passed a gallstone. Poorly his been doing well over a rehabilitation with conservative management but today he was very confused, was really making sense. The family at bedside reports he was having some chills and a fever and was complaining of some worsening abdominal pain. For that reason brought back into the emergency room. They did not report any nausea or vomiting, unknown if he had knee diarrhea. Patient is very confused, he has been given some Ativan to try and obtain a CT of his abdomen pelvis.  In the emergency room had a maximal temperature of 100.1, blood pressure was elevated as high as 163/86. CBC reveals a white blood cell count of 13, hemoglobin and hematocrit stable at 1235 respectively, his pupils panel shows a serum sodium decreased to 132, decreased chloride 97, his creatinine is mildly elevated 1.27. During last admission his creatinine was normal at 0.9. Glucose was 199. AST and ALT were normal at 41 and 33 respectively. Underwent ultrasound of his  abdomen which showed evidence of cholelithiasis without evidence of cholecystitis. He then underwent a CT of the abdomen and pelvis which showed pancreatic stranding specially around the head, lipase was normal at 21. Patient had suspected gallstones with possibility of choledocholithiasis but no evidence of intrahepatic biliary dilatation. Patient had his arms over his abdomen and was very agitated during the study. Patient was given a dose of Zosyn in the hospital was called to readmit.  PAST MEDICAL HISTORY:   Past Medical History  Diagnosis Date  . Cancer [facility]) pt unsure of year    bladder cancer-Dr. Jacqlyn Simmons  . Diabetes mellitus without complication (Chase Simmons)   . COPD (chronic obstructive pulmonary disease) (Chase Simmons)   . Allergy   . Arrhythmia   . Hypertension   . Hyperlipidemia   . Urinary incontinence   . UTI (lower urinary tract infection)   . Tremor, essential     sees [facility] neurologist  . Vitamin D deficiency   . Conversion disorder with seizures or convulsions     PAST SURGICAL HISTORY:   Past Surgical History  Procedure Laterality Date  . Bladder cancer    . Cataract extraction    . Hernia repair      SOCIAL HISTORY:   Social History  Substance Use Topics  . Smoking status: Former Smoker -- 1.50 packs/day for 40 years  . Smokeless tobacco: Never Used  . Alcohol Use: 7.2 oz/week    12 Standard drinks or equivalent per week     Comment: 1-2 drinks daily    FAMILY HISTORY:   Family  History  Problem Relation Age of Onset  . Arthritis Mother   . Dementia Mother   . Cancer Maternal Aunt     breast cancer  . Cancer Maternal Uncle     prostate    DRUG ALLERGIES:   Allergies  Allergen Reactions  . Latex Itching    REVIEW OF SYSTEMS:   ROS  MEDICATIONS AT HOME:   Prior to Admission medications   Medication Sig Start Date End Date Taking? Authorizing Provider  ALPRAZolam Chase Simmons) 0.25 MG tablet take 1 tablet by mouth twice a day if needed for anxiety 06/08/15   Yes Chase Flock, MD  atorvastatin (LIPITOR) 80 MG tablet TAKE 1 TABLET ONE TIME DAILY 04/13/15  Yes Chase Battiest, NP  carvedilol (COREG) 12.5 MG tablet TAKE 1 TABLET  2 (TWO) TIMES DAILY WITH A MEAL. 04/09/15  Yes Chase Battiest, NP  Cholecalciferol (VITAMIN D-1000 MAX ST) 1000 units tablet Take 1,000 mg by mouth daily.    Yes Historical Provider, MD  fexofenadine (ALLEGRA) 180 MG tablet Take 180 mg by mouth daily as needed for allergies or rhinitis.   Yes Historical Provider, MD  glimepiride (AMARYL) 4 MG tablet take 1 tablet by mouth WITH BREAKFAST. 06/10/15  Yes Chase Confer, MD  hydrochlorothiazide (HYDRODIURIL) 12.5 MG tablet Take 1 tablet (12.5 mg total) by mouth daily. Patient taking differently: Take 12.5 mg by mouth daily as needed.  04/13/15  Yes Chase Battiest, NP  lisinopril (PRINIVIL,ZESTRIL) 10 MG tablet TAKE 1 TABLET EVERY DAY 04/09/15  Yes Chase Battiest, NP  meclizine (ANTIVERT) 25 MG tablet Take 1 tablet (25 mg total) by mouth 3 (three) times daily as needed for dizziness or nausea. 04/20/15  Yes Chase Larsen, MD  metFORMIN (GLUCOPHAGE) 1000 MG tablet TAKE 1 TABLET TWICE DAILY  WITH  FOOD 04/08/15  Yes Chase Battiest, NP  ondansetron (ZOFRAN) 4 MG tablet Take 1 tablet (4 mg total) by mouth every 8 (eight) hours as needed for nausea or vomiting. 04/20/15  Yes Chase Larsen, MD  PARoxetine (PAXIL) 40 MG tablet Take 1 tablet (40 mg total) by mouth daily. 01/08/15  Yes Chase Battiest, NP  primidone (MYSOLINE) 50 MG tablet TAKE 1 TABLET TWICE DAILY 04/09/15  Yes Chase Battiest, NP  QUEtiapine (SEROQUEL XR) 50 MG TB24 24 hr tablet Take 1 tablet (50 mg total) by mouth at bedtime. 06/08/15  Yes Chase Flock, MD  tiotropium (SPIRIVA) 18 MCG inhalation capsule Place 1 capsule (18 mcg total) into inhaler and inhale daily. 01/08/15  Yes Chase Battiest, NP  ALPRAZolam Chase Simmons) 0.25 MG tablet Take by mouth. 11/11/11   Historical Provider, MD  ipratropium-albuterol (DUONEB) 0.5-2.5 (3) MG/3ML SOLN Take 3 mLs  by nebulization every 6 (six) hours as needed. 06/08/15   Chase Flock, MD      VITAL SIGNS:  Blood pressure 163/86, pulse 91, temperature 100.1 F (37.8 C), temperature source Rectal, resp. rate 24, weight 112.855 kg (248 lb 12.8 oz), SpO2 94 %.  PHYSICAL EXAMINATION:  Physical Exam  GENERAL:  82 y.o.-year-old patient lying in the bed with no acute distress.  EYES: Pupils equal, round, reactive to light and accommodation. No scleral icterus. Extraocular muscles intact.  HEENT: Head atraumatic, normocephalic. Oropharynx and nasopharynx clear.  NECK:  Supple, no jugular venous distention. No thyroid enlargement, no tenderness.  LUNGS: Normal breath sounds bilaterally, no wheezing, rales,rhonchi or crepitation. No use of accessory muscles of respiration.  CARDIOVASCULAR: S1, S2 normal. No  murmurs, rubs, or gallops.  ABDOMEN: Soft, nontender, nondistended. Bowel sounds present. No organomegaly or mass.  EXTREMITIES: No pedal edema, cyanosis, or clubbing.  NEUROLOGIC: Cranial nerves II through XII are intact. Muscle strength 5/5 in all extremities. Sensation intact. Gait not checked.  PSYCHIATRIC: The patient is alert and oriented x 3.  SKIN: No obvious rash, lesion, or ulcer.   LABORATORY PANEL:   CBC  Recent Labs Lab 06/18/15 1328  WBC 13.0*  HGB 12.0*  HCT 35.5*  PLT 250   ------------------------------------------------------------------------------------------------------------------  Chemistries   Recent Labs Lab 06/18/15 1328  NA 132*  K 3.6  CL 97*  CO2 23  GLUCOSE 199*  BUN 19  CREATININE 1.27*  CALCIUM 8.5*  AST 41  ALT 33  ALKPHOS 67  BILITOT 0.8   ------------------------------------------------------------------------------------------------------------------  Cardiac Enzymes  Recent Labs Lab 06/18/15 1328  TROPONINI 0.05*    ------------------------------------------------------------------------------------------------------------------  RADIOLOGY:  Ct Head Wo Contrast  06/18/2015  CLINICAL DATA:  Increased confusion.  Urinary tract infection. EXAM: CT HEAD WITHOUT CONTRAST TECHNIQUE: Contiguous axial images were obtained from the base of the skull through the vertex without intravenous contrast. COMPARISON:  04/20/2015 FINDINGS: Again noted is low-density in the white matter which is similar to the previous examination. There is mild cerebral atrophy. No evidence for acute hemorrhage, mass lesion, midline shift, hydrocephalus or large infarct. Visualized paranasal sinuses are clear. No calvarial fracture. IMPRESSION: No acute intracranial abnormality. Stable atrophy and evidence for chronic small vessel ischemic changes. Electronically Signed   By: Markus Daft M.D.   On: 06/18/2015 17:38   Ct Abdomen Pelvis W Contrast  06/18/2015  CLINICAL DATA:  Urinary tract infection. Confusion. Leukocytosis. Abdominal pain. EXAM: CT ABDOMEN AND PELVIS WITH CONTRAST TECHNIQUE: Multidetector CT imaging of the abdomen and pelvis was performed using the standard protocol following bolus administration of intravenous contrast. CONTRAST:  146mL ISOVUE-300 IOPAMIDOL (ISOVUE-300) INJECTION 61% COMPARISON:  06/18/2015 ultrasound and 06/04/2015 CT. FINDINGS: The patient was combative, and scanned with he is all arms over his upper abdomen, introducing streak artifact and poor signal to noise ratio which lowers diagnostic sensitivity and specificity. Lower chest: Trace right pleural effusion. Mild airway thickening. Coronary artery atherosclerotic calcification Hepatobiliary: 1.3 by 1.2 cm hypodense lesion posteriorly in the right hepatic lobe image 29/2, previously not well seen but probably present faint dependent density in the gallbladder probably from gallstones as on image 33/2. Pancreas: New abnormal stranding around the pancreatic head  favoring focal pancreatitis. Spleen: Unremarkable aside from an accessory spleen. Adrenals/Urinary Tract: Stable 1.6 cm left mid kidney cyst on image 39/2. Otherwise normal. Adrenal glands normal. Stomach/Bowel: Periampullary duodenal diverticulum. Sigmoid diverticulosis. Vascular/Lymphatic: Aortoiliac atherosclerotic vascular disease. Mildly enlarged peripancreatic lymph nodes including a 1.4 cm peripancreatic lymph node on image 31/2 (formerly 0.8 cm). These are most likely reactive. Reproductive: Small prostate gland. Other: Hernia mesh along the anterior abdominal wall. Musculoskeletal: Lower thoracic and lumbar spondylosis and degenerative disc disease. Moderate degenerative hip arthropathy bilaterally. IMPRESSION: 1. Abnormal peripancreatic stranding especially around the pancreatic head, favoring focal pancreatitis. Correlate with pancreatic enzyme levels. There is also a small adjacent periampullary duodenal diverticulum. 2. Suspected gallstones. Today' s exam does not rule out the possibility of choledocholithiasis although no intrahepatic biliary dilatation is seen. 3. Peripancreatic lymph nodes are mildly enlarged and likely reactive. 4. Trace right pleural effusion. 5. Airway thickening is present, suggesting bronchitis or reactive airways disease. 6. Coronary artery atherosclerosis. Aortoiliac atherosclerotic vascular disease. Electronically Signed   By: Cindra Eves.D.  On: 06/18/2015 20:01   Dg Chest Port 1 View  06/18/2015  CLINICAL DATA:  Elevated troponin EXAM: PORTABLE CHEST 1 VIEW COMPARISON:  01/08/2014 FINDINGS: Cardiac shadow is within normal limits. The lungs are well aerated bilaterally. Mild bibasilar atelectatic changes are seen. No bony abnormality is noted. IMPRESSION: Mild bibasilar atelectasis Electronically Signed   By: Inez Catalina M.D.   On: 06/18/2015 19:05   US Abdomen Limited Ruq  06/18/2015  CLINICAL DATA:  Abdominal pain for 2 weeks. History of pancreatitis,  gallstones. EXAM: US ABDOMEN LIMITED - RIGHT UPPER QUADRANT COMPARISON:  None. FINDINGS: Gallbladder: There are multiple gallstones, largest measuring 7 mm greatest dimension. There is no gallbladder wall thickening, pericholecystic fluid or other secondary signs of acute cholecystitis. Common bile duct: Diameter: Normal at 4 mm. Liver: Portions of the liver are not well seen. No focal lesion identified. Within normal limits in parenchymal echogenicity where seen. IMPRESSION: 1. Cholelithiasis without evidence of acute cholecystitis. 2. No acute findings.  Liver not well seen in its entirety. Electronically Signed   By: Franki Cabot M.D.   On: 06/18/2015 17:16      IMPRESSION AND PLAN:   82 year old male admitted for suspected recurrence of gallstone pancreatitis  1. Acute pancreatitis. Patient has evidence of information on the head of his pancreas on a CT of his abdomen. Has a temperature as high as 100.1, white blood cells elevated at 13. I was given a dose of Zosyn. We'll follow-up the CT of his abdomen pelvis with an MRCP to evaluate further for choledocholithiasis. Depending on the results may need to consult GI for possible ERCP if the stone is present. Otherwise will consult general surgery for possible cholecystectomy. I'll continue him on the IV antibiotics, follow-up a blood culture. LFTs within normal reference range.  2. Anxiety and agitation. Patient has been given Xanax in the emergency room, will continue with when necessary Haldol for agitation. Has circled 50 mg daily at bedtime  3. Lipidemia. Continue atorvastatin 80 mg daily.  4. Non-insulin-dependent diabetes mellitus. Hold glipizide, continue ice of sliding scale  5. Depression. Continue paroxetine 40 mg daily.  6. COPD. Continue Spiriva and DuoNeb.  7. DVT prophylaxis. Heparin 3 times a day    All the records are reviewed and case discussed with ED provider. Management plans discussed with the patient, family and  they are in agreement.  CODE STATUS: Full code  TOTAL TIME TAKING CARE OF THIS PATIENT: 40 minutes.    Alesia Richards M.D on 06/18/2015 at 10:35 PM  Between 7am to 6pm - Pager - [phone]  After 6pm go to [email or web address] - Proofreader  [facility]  Office  [phone]  CC: Primary care physician; Chase Battiest, NP   Note: This dictation was prepared with Dragon dictation along with smaller phrase technology. Any transcriptional errors that result from this process are unintentional.

## 2015-06-18 NOTE — ED Notes (Signed)
Pt. Returned to tx. room.

## 2015-06-18 NOTE — ED Notes (Signed)
[facility] Liaison: Broadus John [phone]

## 2015-06-18 NOTE — ED Provider Notes (Addendum)
[facility] Emergency Department Provider Note   ____________________________________________  Time seen: Approximately 3:15 PM I have reviewed the triage vital signs and the triage nursing note.  HISTORY  Chief Complaint Altered Mental Status   Historian Limited from the patient as he is a poor historian Patient's son  HPI Chase Simmons is a 82 y.o. male with a history of bladder cancer, COPD, recently gallstone pancreatitis after which he was discharged from independent living to [facility]. Reportedly he had been doing well in terms of resolution of abdominal pain until this morning when he called out for mid abdominal pain and was found slightly confused having wheeled his chair down the hallway. History is provided by the son who got the story from the daughter who was apparently at the nursing home.  Son states that his father's mental status seems to be at baseline now. There is no report of focal weakness or numbness.  There has been no report of fever or hypotension. No reported history of chest pain cough or trouble breathing.  Son states that they are "waiting for him to get stronger so that he can have his gallbladder removed."  Reportedly he was found have a urinary tract infection at the nursing home and was recently started on medication for UTI. Patient denies dysuria.    Past Medical History  Diagnosis Date  . Cancer [facility]) pt unsure of year    bladder cancer-Dr. Jacqlyn Larsen  . Diabetes mellitus without complication ([place])   . COPD (chronic obstructive pulmonary disease) ([place])   . Allergy   . Arrhythmia   . Hypertension   . Hyperlipidemia   . Urinary incontinence   . UTI (lower urinary tract infection)   . Tremor, essential     sees [facility] neurologist  . Vitamin D deficiency   . Conversion disorder with seizures or convulsions     Patient Active Problem List   Diagnosis Date Noted  . Anxiety 06/17/2015  . Malignant neoplasm of  urinary bladder ([place]) 06/17/2015  . Controlled type 2 diabetes mellitus without complication ([place]) [id number]  . DD (diverticular disease) 06/17/2015  . BP (high blood pressure) 06/17/2015  . Adiposity 06/17/2015  . Periumbilical abdominal pain   . Pancreatitis, acute 06/04/2015  . Allergic rhinitis 05/14/2015  . Sleep disturbance 05/14/2015  . Non compliance with medical treatment 03/21/2015  . Knee pain, bilateral 03/16/2015  . Anxiety state 04/05/2014  . Atypical nevi 04/05/2014  . Atrial fibrillation ([place]) 08/30/2013  . Acid reflux 08/30/2013  . Chronic diarrhea 03/04/2013  . Tremor, essential 10/22/2012  . HLD (hyperlipidemia) 10/22/2012  . HTN (hypertension) 08/01/2012  . DM (diabetes mellitus), type 2 ([place]) 08/01/2012  . Low testosterone 08/01/2012  . Pain in shoulder 11/11/2011  . Bladder neoplasm of uncertain malignant potential 09/17/2011  . History of neoplasm of bladder 09/17/2011  . Other long term (current) drug therapy 09/02/2011  . ED (erectile dysfunction) of organic origin 09/02/2011  . Incomplete bladder emptying 09/02/2011  . Microscopic hematuria 09/02/2011  . Benign prostatic hyperplasia with urinary obstruction 09/02/2011  . Polypharmacy 09/02/2011  . Infection of urinary tract 09/02/2011  . 1St degree AV block 08/10/2011  . Avitaminosis D 08/10/2011  . Bradycardia 08/09/2011  . History of surgical procedure 08/09/2011  . Leg pain 07/25/2011  . Dropped heart beats 07/25/2011  . Breath shortness 07/25/2011  . Age-associated hearing loss 06/29/2011  . Has a tremor 06/29/2011    Past Surgical History  Procedure Laterality Date  .  Bladder cancer    . Cataract extraction    . Hernia repair      Current Outpatient Rx  Name  Route  Sig  Dispense  Refill  . ALPRAZolam (XANAX) 0.25 MG tablet      take 1 tablet by mouth twice a day if needed for anxiety   60 tablet   1   . atorvastatin (LIPITOR) 80 MG tablet      TAKE 1 TABLET ONE TIME DAILY    90 tablet   2   . carvedilol (COREG) 12.5 MG tablet      TAKE 1 TABLET  2 (TWO) TIMES DAILY WITH A MEAL.   180 tablet   3   . Cholecalciferol (VITAMIN D-1000 MAX ST) 1000 units tablet   Oral   Take 1,000 mg by mouth daily.          . fexofenadine (ALLEGRA) 180 MG tablet   Oral   Take 180 mg by mouth daily as needed for allergies or rhinitis.         Marland Kitchen glimepiride (AMARYL) 4 MG tablet      take 1 tablet by mouth WITH BREAKFAST.   30 tablet   1   . hydrochlorothiazide (HYDRODIURIL) 12.5 MG tablet   Oral   Take 1 tablet (12.5 mg total) by mouth daily. Patient taking differently: Take 12.5 mg by mouth daily as needed.    30 tablet   0   . lisinopril (PRINIVIL,ZESTRIL) 10 MG tablet      TAKE 1 TABLET EVERY DAY   90 tablet   3   . meclizine (ANTIVERT) 25 MG tablet   Oral   Take 1 tablet (25 mg total) by mouth 3 (three) times daily as needed for dizziness or nausea.   21 tablet   0   . metFORMIN (GLUCOPHAGE) 1000 MG tablet      TAKE 1 TABLET TWICE DAILY  WITH  FOOD   180 tablet   3   . ondansetron (ZOFRAN) 4 MG tablet   Oral   Take 1 tablet (4 mg total) by mouth every 8 (eight) hours as needed for nausea or vomiting.   21 tablet   0   . PARoxetine (PAXIL) 40 MG tablet   Oral   Take 1 tablet (40 mg total) by mouth daily.   90 tablet   1   . primidone (MYSOLINE) 50 MG tablet      TAKE 1 TABLET TWICE DAILY   180 tablet   3   . QUEtiapine (SEROQUEL XR) 50 MG TB24 24 hr tablet   Oral   Take 1 tablet (50 mg total) by mouth at bedtime.   90 each      . tiotropium (SPIRIVA) 18 MCG inhalation capsule   Inhalation   Place 1 capsule (18 mcg total) into inhaler and inhale daily.   30 capsule   5   . ALPRAZolam (XANAX) 0.25 MG tablet   Oral   Take by mouth.         Marland Kitchen ipratropium-albuterol (DUONEB) 0.5-2.5 (3) MG/3ML SOLN   Nebulization   Take 3 mLs by nebulization every 6 (six) hours as needed.   360 mL        Allergies Latex  Family  History  Problem Relation Age of Onset  . Arthritis Mother   . Dementia Mother   . Cancer Maternal Aunt     breast cancer  . Cancer Maternal Uncle     prostate  Social History Social History  Substance Use Topics  . Smoking status: Former Smoker -- 1.50 packs/day for 40 years  . Smokeless tobacco: Never Used  . Alcohol Use: 7.2 oz/week    12 Standard drinks or equivalent per week     Comment: 1-2 drinks daily    Review of Systems  Constitutional: Negative for fever. Eyes: Negative for visual changes. ENT: Negative for sore throat. Cardiovascular: Negative for chest pain. Respiratory: Negative for shortness of breath. Gastrointestinal: Negative for vomiting and diarrhea. Genitourinary: Negative for dysuria. Musculoskeletal: Negative for back pain. Skin: Negative for rash. Neurological: Negative for headache. 10 point Review of Systems otherwise negative ____________________________________________   PHYSICAL EXAM:  VITAL SIGNS: ED Triage Vitals  Enc Vitals Group     BP 06/18/15 1332 116/61 mmHg     Pulse Rate 06/18/15 1332 87     Resp 06/18/15 1332 23     Temp 06/18/15 1333 98.1 F (36.7 C)     Temp Source 06/18/15 1333 Oral     SpO2 06/18/15 1332 98 %     Weight 06/18/15 1333 235 lb 11.2 oz (106.913 kg)     Height --      Head Cir --      [facility] --      Pain Score --      Pain Loc --      Pain Edu? --      Excl. in [place]? --      Constitutional: Alert and Sleeping, but easily arousable. Making jokes and cooperative. Well appearing and in no distress. HEENT   Head: Normocephalic and atraumatic.      Eyes: Conjunctivae are normal. PERRL. Normal extraocular movements.      Ears:         Nose: No congestion/rhinnorhea.   Mouth/Throat: Mucous membranes are moist.   Neck: No stridor. Cardiovascular/Chest: Normal rate, regular rhythm.  No murmurs, rubs, or gallops. Respiratory: Normal respiratory effort without tachypnea nor retractions. Breath  sounds are clear and equal bilaterally. No wheezes/rales/rhonchi. Gastrointestinal: Soft. No distention, no guarding, no rebound. Nontender.    Genitourinary/rectal:Deferred Musculoskeletal: Nontender with normal range of motion in all extremities. No joint effusions.  No lower extremity tenderness. No edema. Neurologic:  No facial droop or slurred speech. No gross or focal neurologic deficits are appreciated. Skin:  Skin is warm, dry and intact. No rash noted. Psychiatric: No hallucinations  ____________________________________________   EKG I, Lisa Roca, MD, the attending physician have personally viewed and interpreted all ECGs.  89 bpm. Appears to be in normal sinus rhythm. Nonspecific intraventricular conduction delay. Left axis deviation. Nonspecific ST and T-wave ____________________________________________  LABS (pertinent positives/negatives)  Labs Reviewed  COMPREHENSIVE METABOLIC PANEL - Abnormal; Notable for the following:    Sodium 132 (*)    Chloride 97 (*)    Glucose, Bld 199 (*)    Creatinine, Ser 1.27 (*)    Calcium 8.5 (*)    Albumin 3.0 (*)    GFR calc non Af Amer 52 (*)    GFR calc Af Amer 60 (*)    All other components within normal limits  CBC - Abnormal; Notable for the following:    WBC 13.0 (*)    RBC 3.62 (*)    Hemoglobin 12.0 (*)    HCT 35.5 (*)    All other components within normal limits  URINALYSIS COMPLETEWITH MICROSCOPIC ([facility] ONLY) - Abnormal; Notable for the following:    Color, Urine YELLOW (*)  APPearance CLEAR (*)    Glucose, UA 50 (*)    Squamous Epithelial / LPF 0-5 (*)    All other components within normal limits  TROPONIN I - Abnormal; Notable for the following:    Troponin I 0.05 (*)    All other components within normal limits  CULTURE, BLOOD (ROUTINE X 2)  CULTURE, BLOOD (ROUTINE X 2)  URINE CULTURE  LIPASE, BLOOD    ____________________________________________  RADIOLOGY All Xrays were viewed by me. Imaging  interpreted by Radiologist.  CT head without contrast:  IMPRESSION: No acute intracranial abnormality.  Stable atrophy and evidence for chronic small vessel ischemic Changes.  Ultrasound right upper quadrant:  IMPRESSION: 1. Cholelithiasis without evidence of acute cholecystitis. 2. No acute findings. Liver not well seen in its entirety.  Chest x-ray: IMPRESSION: Mild bibasilar atelectasis  CT abdomen and pelvis with contrast:  IMPRESSION: 1. Abnormal peripancreatic stranding especially around the pancreatic head, favoring focal pancreatitis. Correlate with pancreatic enzyme levels. There is also a small adjacent periampullary duodenal diverticulum. 2. Suspected gallstones. Today' s exam does not rule out the possibility of choledocholithiasis although no intrahepatic biliary dilatation is seen. 3. Peripancreatic lymph nodes are mildly enlarged and likely reactive. 4. Trace right pleural effusion. 5. Airway thickening is present, suggesting bronchitis or reactive airways disease. 6. Coronary artery atherosclerosis. Aortoiliac atherosclerotic vascular disease. __________________________________________  PROCEDURES  Procedure(s) performed: None  Critical Care performed: None  ____________________________________________   ED COURSE / ASSESSMENT AND PLAN  Pertinent labs & imaging results that were available during my care of the patient were reviewed by me and considered in my medical decision making (see chart for details).   It sounds that the patient was sent in from [facility] for evaluation of abdominal pain as well as confusion/altered mental status.  In terms of the belly pain, he was recently started on an MRI for urinary tract infection, and he also was recently diagnosed with gallstone pancreatitis.  He is not febrile here, and he doesn't really seem to be having belly pain, although he is a poor historian and I'm not sure how much discomfort he may be  hiding.  His white blood count is minimally elevated, but his hemoglobin is stable. His urinalysis shows no acute UTI now I will send it for culture. I am also send blood cultures given the confusion reported. He does not appear to be having an acute stroke clinically, however with the confusion I am going to send a head CT.  I am adding on lipase, troponin, ultrasound of the right upper quadrant, chest x-ray given occasional elevated respiratory rate, and head CT noncontrast.  CT of the head shows no acute findings. Right upper quadrant ultrasound shows cholelithiasis without infectious signs. Troponin minimally elevated 0.05. Lipase is normal at 21 as well as the rest of LFTs.  Baseline creatinine 0.8, today 1.27, IV fluid bolus given.  Daughter did come in and shared that the patient was very altered to baseline and was having severe abdominal pain earlier. He is still altered, but does not appear to be having the same amount of abdominal pain when I examined him earlier, but daughter stated that she noticed that he was having pain, then he received Ativan in order to be able to get the head CT and now he is slightly sedated.  Given that I still have a source of his abdominal pain, it could be symptomatic gallstones, however with elevated white blood count, I will obtain a CT of  abdomen and pelvis.  His troponin is minimally elevated, he sedated and unable to take by mouth and so I will give him rectal aspirin. His previous troponin has been minimally elevated 0.06 once the past, family reports no history of prior coronary artery disease. Daughter also states he is not complaining of chest pain.  Patient will need to be admitted for altered mental status and observation for abdominal pain with elevated white blood count. No evidence of sepsis at this point with no fever or tachycardia or hypertension. No source and so no antibiotics started at this point in time.  CT scan does not show a source  for infection. There is question of signs for possible pancreatitis, but patient's lipase is not elevated. In either case, no surgical emergency findings, and no evidence of infectious source. I discussed with the hospitalist for admission and will leave any antibiotics choice up to the hospitalist.  CONSULTATIONS:   Hospitalist for admission   Patient / Family / Caregiver informed of clinical course, medical decision-making process, and agree with plan.   Addended to include nurse notified me that his axillary temperature 99 and a rectal temp was checked and it was 100.  Additionally the patient was reportedly started on Macrobid for urinary tract infection at the nursing home. Given the white blood cell count being elevated, the complaining of abdominal pain, altered mental status, currently being treated for for UTI with Macrobid, I am going to go ahead and cover her for possible sepsis now that he is having of fever. Patient was started on vancomycin and Zosyn. I did add on a lactate. Lactate pending at time of transfer to the hospitalist.  At this point in time without hypotension or tachycardia I am not initiating 30 cc/kg bolus. Patient received 1 L normal saline. If lactate comes back elevated, hospitalist can initiate the additional fluid bolus. ___________________________________________   FINAL CLINICAL IMPRESSION(S) / ED DIAGNOSES   Final diagnoses:  Abdominal pain  Acute renal failure, unspecified acute renal failure type ([place])  Altered mental status, unspecified altered mental status type              Note: This dictation was prepared with Dragon dictation. Any transcriptional errors that result from this process are unintentional   Lisa Roca, MD 06/18/15 2010  Lisa Roca, MD 06/18/15 2021

## 2015-06-18 NOTE — ED Notes (Addendum)
Daughter: [name] [phone]

## 2015-06-19 ENCOUNTER — Encounter: Payer: Self-pay | Admitting: *Deleted

## 2015-06-19 ENCOUNTER — Inpatient Hospital Stay: Payer: Commercial Managed Care - HMO

## 2015-06-19 DIAGNOSIS — Z79899 Other long term (current) drug therapy: Secondary | ICD-10-CM | POA: Diagnosis not present

## 2015-06-19 DIAGNOSIS — E785 Hyperlipidemia, unspecified: Secondary | ICD-10-CM | POA: Diagnosis present

## 2015-06-19 DIAGNOSIS — J449 Chronic obstructive pulmonary disease, unspecified: Secondary | ICD-10-CM | POA: Diagnosis present

## 2015-06-19 DIAGNOSIS — K5900 Constipation, unspecified: Secondary | ICD-10-CM | POA: Diagnosis present

## 2015-06-19 DIAGNOSIS — R109 Unspecified abdominal pain: Secondary | ICD-10-CM | POA: Diagnosis present

## 2015-06-19 DIAGNOSIS — K8020 Calculus of gallbladder without cholecystitis without obstruction: Secondary | ICD-10-CM | POA: Diagnosis present

## 2015-06-19 DIAGNOSIS — Z8551 Personal history of malignant neoplasm of bladder: Secondary | ICD-10-CM | POA: Diagnosis not present

## 2015-06-19 DIAGNOSIS — Z87891 Personal history of nicotine dependence: Secondary | ICD-10-CM | POA: Diagnosis not present

## 2015-06-19 DIAGNOSIS — R0600 Dyspnea, unspecified: Secondary | ICD-10-CM | POA: Diagnosis not present

## 2015-06-19 DIAGNOSIS — E119 Type 2 diabetes mellitus without complications: Secondary | ICD-10-CM | POA: Diagnosis present

## 2015-06-19 DIAGNOSIS — R0902 Hypoxemia: Secondary | ICD-10-CM | POA: Diagnosis not present

## 2015-06-19 DIAGNOSIS — I959 Hypotension, unspecified: Secondary | ICD-10-CM | POA: Diagnosis not present

## 2015-06-19 DIAGNOSIS — F329 Major depressive disorder, single episode, unspecified: Secondary | ICD-10-CM | POA: Diagnosis present

## 2015-06-19 DIAGNOSIS — F0390 Unspecified dementia without behavioral disturbance: Secondary | ICD-10-CM | POA: Diagnosis present

## 2015-06-19 DIAGNOSIS — Z6831 Body mass index (BMI) 31.0-31.9, adult: Secondary | ICD-10-CM | POA: Diagnosis not present

## 2015-06-19 DIAGNOSIS — Z8261 Family history of arthritis: Secondary | ICD-10-CM | POA: Diagnosis not present

## 2015-06-19 DIAGNOSIS — F419 Anxiety disorder, unspecified: Secondary | ICD-10-CM | POA: Diagnosis present

## 2015-06-19 DIAGNOSIS — Z7984 Long term (current) use of oral hypoglycemic drugs: Secondary | ICD-10-CM | POA: Diagnosis not present

## 2015-06-19 DIAGNOSIS — I10 Essential (primary) hypertension: Secondary | ICD-10-CM | POA: Diagnosis present

## 2015-06-19 DIAGNOSIS — R001 Bradycardia, unspecified: Secondary | ICD-10-CM | POA: Diagnosis not present

## 2015-06-19 DIAGNOSIS — M79606 Pain in leg, unspecified: Secondary | ICD-10-CM | POA: Diagnosis present

## 2015-06-19 DIAGNOSIS — E669 Obesity, unspecified: Secondary | ICD-10-CM | POA: Diagnosis present

## 2015-06-19 DIAGNOSIS — K851 Biliary acute pancreatitis without necrosis or infection: Secondary | ICD-10-CM | POA: Diagnosis present

## 2015-06-19 LAB — COMPREHENSIVE METABOLIC PANEL
ALBUMIN: 2.7 g/dL — AB (ref 3.5–5.0)
ALBUMIN: 2.8 g/dL — AB (ref 3.5–5.0)
ALK PHOS: 58 U/L (ref 38–126)
ALT: 35 U/L (ref 17–63)
ALT: 39 U/L (ref 17–63)
AST: 38 U/L (ref 15–41)
AST: 43 U/L — AB (ref 15–41)
Alkaline Phosphatase: 61 U/L (ref 38–126)
Anion gap: 7 (ref 5–15)
Anion gap: 8 (ref 5–15)
BILIRUBIN TOTAL: 0.9 mg/dL (ref 0.3–1.2)
BUN: 15 mg/dL (ref 6–20)
BUN: 15 mg/dL (ref 6–20)
CALCIUM: 7.9 mg/dL — AB (ref 8.9–10.3)
CHLORIDE: 99 mmol/L — AB (ref 101–111)
CO2: 27 mmol/L (ref 22–32)
CO2: 27 mmol/L (ref 22–32)
CREATININE: 0.98 mg/dL (ref 0.61–1.24)
Calcium: 8.3 mg/dL — ABNORMAL LOW (ref 8.9–10.3)
Chloride: 98 mmol/L — ABNORMAL LOW (ref 101–111)
Creatinine, Ser: 0.96 mg/dL (ref 0.61–1.24)
GFR calc Af Amer: >60 mL/min (ref >60)
GFR calc Af Amer: >60 mL/min (ref >60)
GFR calc non Af Amer: >60 mL/min (ref >60)
GLUCOSE: 136 mg/dL — AB (ref 65–99)
Glucose, Bld: 140 mg/dL — ABNORMAL HIGH (ref 65–99)
POTASSIUM: 3.8 mmol/L (ref 3.5–5.1)
Potassium: 3.3 mmol/L — ABNORMAL LOW (ref 3.5–5.1)
SODIUM: 132 mmol/L — AB (ref 135–145)
SODIUM: 134 mmol/L — AB (ref 135–145)
Total Bilirubin: 0.9 mg/dL (ref 0.3–1.2)
Total Protein: 5.8 g/dL — ABNORMAL LOW (ref 6.5–8.1)
Total Protein: 6 g/dL — ABNORMAL LOW (ref 6.5–8.1)

## 2015-06-19 LAB — CBC
HEMATOCRIT: 32.8 % — AB (ref 40.0–52.0)
HEMOGLOBIN: 11.2 g/dL — AB (ref 13.0–18.0)
MCH: 33.3 pg (ref 26.0–34.0)
MCHC: 34 g/dL (ref 32.0–36.0)
MCV: 97.9 fL (ref 80.0–100.0)
Platelets: 208 10*3/uL (ref 150–440)
RBC: 3.35 MIL/uL — AB (ref 4.40–5.90)
RDW: 13.2 % (ref 11.5–14.5)
WBC: 9 10*3/uL (ref 3.8–10.6)

## 2015-06-19 LAB — SURGICAL PCR SCREEN
MRSA, PCR: NEGATIVE
Staphylococcus aureus: POSITIVE — AB

## 2015-06-19 LAB — GLUCOSE, CAPILLARY
GLUCOSE-CAPILLARY: 127 mg/dL — AB (ref 65–99)
Glucose-Capillary: 158 mg/dL — ABNORMAL HIGH (ref 65–99)
Glucose-Capillary: 125 mg/dL — ABNORMAL HIGH (ref 65–99)
Glucose-Capillary: 119 mg/dL — ABNORMAL HIGH (ref 65–99)

## 2015-06-19 LAB — URINE CULTURE: CULTURE: NO GROWTH

## 2015-06-19 LAB — HEMOGLOBIN A1C: HEMOGLOBIN A1C: 7 % — AB (ref 4.0–6.0)

## 2015-06-19 MED ORDER — MORPHINE SULFATE (PF) 2 MG/ML IV SOLN
2.0000 mg | INTRAVENOUS | Status: DC | PRN
  Administered 2015-06-19: 2 mg via INTRAVENOUS

## 2015-06-19 MED ORDER — ALPRAZOLAM 0.5 MG PO TABS
ORAL_TABLET | ORAL | Status: AC
  Administered 2015-06-19: 0.25 mg via ORAL
  Filled 2015-06-19: qty 1

## 2015-06-19 MED ORDER — POTASSIUM CHLORIDE 10 MEQ/100ML IV SOLN
10.0000 meq | INTRAVENOUS | Status: DC
  Administered 2015-06-19: 10 meq via INTRAVENOUS
  Filled 2015-06-19 (×4): qty 100

## 2015-06-19 MED ORDER — HYDROCODONE-ACETAMINOPHEN 5-325 MG PO TABS
1.0000 | ORAL_TABLET | ORAL | Status: DC | PRN
Start: 2015-06-19 — End: 2015-06-23
  Administered 2015-06-22: 1 via ORAL
  Filled 2015-06-19: qty 1

## 2015-06-19 MED ORDER — DOCUSATE SODIUM 100 MG PO CAPS
100.0000 mg | ORAL_CAPSULE | Freq: Two times a day (BID) | ORAL | Status: DC
  Administered 2015-06-19 – 2015-06-22 (×6): 100 mg via ORAL
  Filled 2015-06-19 (×6): qty 1

## 2015-06-19 MED ORDER — ACETAMINOPHEN 325 MG PO TABS
650.0000 mg | ORAL_TABLET | Freq: Four times a day (QID) | ORAL | Status: DC | PRN
Start: 2015-06-19 — End: 2015-06-23

## 2015-06-19 MED ORDER — ACETAMINOPHEN 650 MG RE SUPP: 650.0000 mg | Freq: Four times a day (QID) | RECTAL | Status: DC | PRN

## 2015-06-19 MED ORDER — ENOXAPARIN SODIUM 40 MG/0.4ML ~~LOC~~ SOLN
40.0000 mg | SUBCUTANEOUS | Status: DC
  Administered 2015-06-19 – 2015-06-21 (×3): 40 mg via SUBCUTANEOUS
  Filled 2015-06-19 (×3): qty 0.4

## 2015-06-19 MED ORDER — MORPHINE SULFATE (PF) 2 MG/ML IV SOLN
INTRAVENOUS | Status: AC
  Filled 2015-06-19: qty 1

## 2015-06-19 NOTE — Progress Notes (Signed)
Patient complaining that IV potassium is burning. Decreased rate significantly to help patient tolerate. Dr. Margaretmary Eddy on floor and stated patient may be able to take PO once MRCP comes back and if patient is more alert. Will assess once patient is back from test.

## 2015-06-19 NOTE — Progress Notes (Signed)
CSw attempted to speak to patient. Patient is oriented. CSW attempted to call [name] and [name] over the telephone. Left voicemail. Awaiting phone call back. [facility] He currently has a Actuary. Sitter will have to be discontinued for 24 hours before patient can return to a SNF. CSW will continue to follow and assist.  Ernest Pine, MSW, [place] Work Department [phone]

## 2015-06-19 NOTE — NC FL2 (Signed)
[place] LEVEL OF CARE SCREENING TOOL     IDENTIFICATION  Patient Name: Chase Simmons Birthdate: 07/06/1933 Sex: male Admission Date (Current Location): 06/18/2015  [place] and [place] Number:  Engineering geologist and Address:  [facility], [address] [place], [place] [address]      Provider Number: [id number]  Attending Physician Name and Address:  Nicholes Mango, MD  Relative Name and Phone Number:       Current Level of Care: Hospital Recommended Level of Care: [place] Prior Approval Number:    Date Approved/Denied:   PASRR Number:  ([id number])  Discharge Plan: SNF    Current Diagnoses: Patient Active Problem List   Diagnosis Date Noted  . Acute gallstone pancreatitis 06/19/2015  . Anxiety 06/17/2015  . Malignant neoplasm of urinary bladder ([place]) 06/17/2015  . Controlled type 2 diabetes mellitus without complication ([place]) XX123456  . DD (diverticular disease) 06/17/2015  . BP (high blood pressure) 06/17/2015  . Adiposity 06/17/2015  . Periumbilical abdominal pain   . Pancreatitis, acute 06/04/2015  . Allergic rhinitis 05/14/2015  . Sleep disturbance 05/14/2015  . Non compliance with medical treatment 03/21/2015  . Knee pain, bilateral 03/16/2015  . Anxiety state 04/05/2014  . Atypical nevi 04/05/2014  . Atrial fibrillation ([place]) 08/30/2013  . Acid reflux 08/30/2013  . Chronic diarrhea 03/04/2013  . Tremor, essential 10/22/2012  . HLD (hyperlipidemia) 10/22/2012  . HTN (hypertension) 08/01/2012  . DM (diabetes mellitus), type 2 ([place]) 08/01/2012  . Low testosterone 08/01/2012  . Pain in shoulder 11/11/2011  . Bladder neoplasm of uncertain malignant potential 09/17/2011  . History of neoplasm of bladder 09/17/2011  . Other long term (current) drug therapy 09/02/2011  . ED (erectile dysfunction) of organic origin 09/02/2011  . Incomplete bladder emptying 09/02/2011  . Microscopic hematuria  09/02/2011  . Benign prostatic hyperplasia with urinary obstruction 09/02/2011  . Polypharmacy 09/02/2011  . Infection of urinary tract 09/02/2011  . 1St degree AV block 08/10/2011  . Avitaminosis D 08/10/2011  . Bradycardia 08/09/2011  . History of surgical procedure 08/09/2011  . Leg pain 07/25/2011  . Dropped heart beats 07/25/2011  . Breath shortness 07/25/2011  . Age-associated hearing loss 06/29/2011  . Has a tremor 06/29/2011    Orientation RESPIRATION BLADDER Height & Weight     Self  Normal Continent Weight: 228 lb 8 oz (103.647 kg) Height:  5\' 11"  (180.3 cm)  BEHAVIORAL SYMPTOMS/MOOD NEUROLOGICAL BOWEL NUTRITION STATUS   (none )  (none ) Continent Diet (NPO)  AMBULATORY STATUS COMMUNICATION OF NEEDS Skin   Extensive Assist Verbally Normal                       Personal Care Assistance Level of Assistance  Bathing, Feeding, Dressing Bathing Assistance: Limited assistance Feeding assistance: Independent Dressing Assistance: Limited assistance     Functional Limitations Info  Sight, Hearing, Speech Sight Info: Adequate Hearing Info: Impaired Speech Info: Adequate    SPECIAL CARE FACTORS FREQUENCY  PT (By licensed PT), OT (By licensed OT)     PT Frequency:  (5) OT Frequency:  (5)            Contractures      Additional Factors Info  Code Status, Allergies, Psychotropic, Insulin Sliding Scale Code Status Info:  (Full Code. ) Allergies Info:  (Latex)   Insulin Sliding Scale Info:  (NovoLog Insulin Injections 3 times per day )       Current  Medications (06/19/2015):  This is the current hospital active medication list Current Facility-Administered Medications  Medication Dose Route Frequency Provider Last Rate Last Dose  . acetaminophen (TYLENOL) tablet 650 mg  650 mg Oral Q6H PRN Alesia Richards, MD       Or  . acetaminophen (TYLENOL) suppository 650 mg  650 mg Rectal Q6H PRN Alesia Richards, MD      . ALPRAZolam Duanne Moron) tablet 0.25 mg  0.25 mg  Oral TID PRN Alesia Richards, MD   0.25 mg at 06/19/15 0059  . atorvastatin (LIPITOR) tablet 80 mg  80 mg Oral q1800 Alesia Richards, MD      . carvedilol (COREG) tablet 12.5 mg  12.5 mg Oral BID WC Alesia Richards, MD   12.5 mg at 06/19/15 0800  . cholecalciferol (VITAMIN D) tablet 1,000 Units  1,000 Units Oral Daily Alesia Richards, MD   1,000 Units at 06/19/15 1000  . docusate sodium (COLACE) capsule 100 mg  100 mg Oral BID Alesia Richards, MD   100 mg at 06/19/15 0202  . enoxaparin (LOVENOX) injection 40 mg  40 mg Subcutaneous Q24H Alesia Richards, MD      . haloperidol lactate (HALDOL) injection 5 mg  5 mg Intravenous Q6H PRN Alesia Richards, MD      . hydrochlorothiazide (HYDRODIURIL) tablet 12.5 mg  12.5 mg Oral Daily Alesia Richards, MD   12.5 mg at 06/19/15 1000  . HYDROcodone-acetaminophen (NORCO/VICODIN) 5-325 MG per tablet 1 tablet  1 tablet Oral Q4H PRN Alesia Richards, MD      . insulin aspart (novoLOG) injection 0-9 Units  0-9 Units Subcutaneous TID WC Alesia Richards, MD   0 Units at 06/19/15 0800  . ipratropium-albuterol (DUONEB) 0.5-2.5 (3) MG/3ML nebulizer solution 3 mL  3 mL Nebulization Q6H PRN Alesia Richards, MD      . lisinopril (PRINIVIL,ZESTRIL) tablet 10 mg  10 mg Oral Daily Alesia Richards, MD   10 mg at 06/19/15 1000  . loratadine (CLARITIN) tablet 10 mg  10 mg Oral Daily Alesia Richards, MD   10 mg at 06/19/15 1000  . morphine 2 MG/ML injection 2 mg  2 mg Intravenous Q2H PRN Alesia Richards, MD   2 mg at 06/19/15 0021  . PARoxetine (PAXIL) tablet 40 mg  40 mg Oral Daily Alesia Richards, MD   40 mg at 06/19/15 1000  . piperacillin-tazobactam (ZOSYN) IVPB 3.375 g  3.375 g Intravenous Q8H Alesia Richards, MD 12.5 mL/hr at 06/19/15 1454 3.375 g at 06/19/15 1454  . potassium chloride 10 mEq in 100 mL IVPB  10 mEq Intravenous Q1 Hr x 4 Phillip Maffei, MD   10 mEq at 06/19/15 1231  . primidone (MYSOLINE) tablet 50 mg  50 mg Oral BID Alesia Richards, MD   50 mg at 06/19/15 [id number]  . QUEtiapine  (SEROQUEL XR) 24 hr tablet 50 mg  50 mg Oral QHS Alesia Richards, MD   50 mg at 06/19/15 0219  . tiotropium (SPIRIVA) inhalation capsule 18 mcg  18 mcg Inhalation Daily Alesia Richards, MD   18 mcg at 06/19/15 1000     Discharge Medications: Please see discharge summary for a list of discharge medications.  Relevant Imaging Results:  Relevant Lab Results:   Additional Information  (SSN: [id number])  Loralyn Freshwater, LCSW

## 2015-06-19 NOTE — Consult Note (Signed)
Patient ID: Chase Simmons, male   DOB: 06-11-1933, 82 y.o.   MRN: [id number]  History of Present Illness Chase Simmons is a 82 y.o. male known to our practice with a recent episode of presumed gallstone pancreatitis. Patient was treated medically and was sent to the rehabilitation facility where he was recovering. He does have significant comorbidities including dementia, COPD, diabetes and obesity. He is functional baseline status is acceptable but not the greatest. He is able to walk with a walker for a few yards apparently a few months ago he was able to eat by himself and needed some assistance with certain ADLs. He is just recovering from significant deconditioning and oriented to the son yesterday he started complaining of abdominal pain. And the history is obtained from the son because the patient is disoriented and unable to provide a good history. According to the son is that was complaining of sharp pain that was moderate in intensity in the periumbilical area. Decreased appetite. No fevers no chills Probably workup has included a normal lipase and CT scan with some inflammatory response around the head of the pancreas without evidence of necrotizing pancreatitis or without evidence of any fluid collections  Past Medical History Past Medical History  Diagnosis Date  . Cancer [facility]) pt unsure of year    bladder cancer-Dr. Jacqlyn Larsen  . Diabetes mellitus without complication ([place])   . COPD (chronic obstructive pulmonary disease) ([place])   . Allergy   . Arrhythmia   . Hypertension   . Hyperlipidemia   . Urinary incontinence   . UTI (lower urinary tract infection)   . Tremor, essential     sees [facility] neurologist  . Vitamin D deficiency   . Conversion disorder with seizures or convulsions        Past Surgical History  Procedure Laterality Date  . Bladder cancer    . Cataract extraction    . Hernia repair      Allergies  Allergen Reactions  . Latex Itching    Current  Facility-Administered Medications  Medication Dose Route Frequency Provider Last Rate Last Dose  . acetaminophen (TYLENOL) tablet 650 mg  650 mg Oral Q6H PRN Alesia Richards, MD       Or  . acetaminophen (TYLENOL) suppository 650 mg  650 mg Rectal Q6H PRN Alesia Richards, MD      . ALPRAZolam Duanne Moron) tablet 0.25 mg  0.25 mg Oral TID PRN Alesia Richards, MD   0.25 mg at 06/19/15 0059  . atorvastatin (LIPITOR) tablet 80 mg  80 mg Oral q1800 Alesia Richards, MD   80 mg at 06/19/15 1740  . carvedilol (COREG) tablet 12.5 mg  12.5 mg Oral BID WC Alesia Richards, MD   12.5 mg at 06/19/15 1740  . cholecalciferol (VITAMIN D) tablet 1,000 Units  1,000 Units Oral Daily Alesia Richards, MD   1,000 Units at 06/19/15 1000  . docusate sodium (COLACE) capsule 100 mg  100 mg Oral BID Alesia Richards, MD   100 mg at 06/19/15 0202  . enoxaparin (LOVENOX) injection 40 mg  40 mg Subcutaneous Q24H Alesia Richards, MD      . haloperidol lactate (HALDOL) injection 5 mg  5 mg Intravenous Q6H PRN Alesia Richards, MD      . hydrochlorothiazide (HYDRODIURIL) tablet 12.5 mg  12.5 mg Oral Daily Alesia Richards, MD   12.5 mg at 06/19/15 1000  . HYDROcodone-acetaminophen (NORCO/VICODIN) 5-325 MG per tablet 1 tablet  1 tablet Oral Q4H PRN Alesia Richards, MD      .  insulin aspart (novoLOG) injection 0-9 Units  0-9 Units Subcutaneous TID WC Alesia Richards, MD   0 Units at 06/19/15 0800  . ipratropium-albuterol (DUONEB) 0.5-2.5 (3) MG/3ML nebulizer solution 3 mL  3 mL Nebulization Q6H PRN Alesia Richards, MD      . lisinopril (PRINIVIL,ZESTRIL) tablet 10 mg  10 mg Oral Daily Alesia Richards, MD   10 mg at 06/19/15 1000  . loratadine (CLARITIN) tablet 10 mg  10 mg Oral Daily Alesia Richards, MD   10 mg at 06/19/15 1000  . morphine 2 MG/ML injection 2 mg  2 mg Intravenous Q2H PRN Alesia Richards, MD   2 mg at 06/19/15 0021  . PARoxetine (PAXIL) tablet 40 mg  40 mg Oral Daily Alesia Richards, MD   40 mg at 06/19/15 1000  . piperacillin-tazobactam  (ZOSYN) IVPB 3.375 g  3.375 g Intravenous Q8H Alesia Richards, MD 12.5 mL/hr at 06/19/15 1454 3.375 g at 06/19/15 1454  . primidone (MYSOLINE) tablet 50 mg  50 mg Oral BID Alesia Richards, MD   50 mg at 06/19/15 [id number]  . QUEtiapine (SEROQUEL XR) 24 hr tablet 50 mg  50 mg Oral QHS Alesia Richards, MD   50 mg at 06/19/15 0219  . tiotropium (SPIRIVA) inhalation capsule 18 mcg  18 mcg Inhalation Daily Alesia Richards, MD   18 mcg at 06/19/15 1000    Family History Family History  Problem Relation Age of Onset  . Arthritis Mother   . Dementia Mother   . Cancer Maternal Aunt     breast cancer  . Cancer Maternal Uncle     prostate      Social History Social History  Substance Use Topics  . Smoking status: Former Smoker -- 1.50 packs/day for 40 years  . Smokeless tobacco: Never Used  . Alcohol Use: 7.2 oz/week    12 Standard drinks or equivalent per week     Comment: 1-2 drinks daily        ROS Unable to be obtained by the patient and according to the son the ROA is otherwise negative  Physical Exam Blood pressure 123/60, pulse 63, temperature 97.8 F (36.6 C), temperature source Oral, resp. rate 18, height 5\' 11"  (1.803 m), weight 103.647 kg (228 lb 8 oz), SpO2 97 %.  CONSTITUTIONAL: Debilitated elderly male EYES: Pupils equal, round, and reactive to light, Sclera non-icteric. EARS, NOSE, MOUTH AND THROAT: The oropharynx is clear. Oral mucosa is pink and moist. Hearing is intact to voice.  NECK: Trachea is midline, and there is no jugular venous distension. Thyroid is without palpable abnormalities. LYMPH NODES:  Lymph nodes in the neck are not enlarged. RESPIRATORY:  Lungs are clear, and breath sounds are equal bilaterally.  Limited resp effort CARDIOVASCULAR: Heart is regular without murmurs, gallops, or rubs. GI: The abdomen is soft, nontender, and nondistended. There were no palpable masses. There was no hepatosplenomegaly. There were normal bowel sounds.  SKIN: Skin turgor is  normal. There are no pathologic skin lesions.  NEUROLOGIC:  Motor and sensation is grossly normal.  He is weak and debilitated no focal deficits PSYCH: Supple and, disoriented able to answers very simple questions.  Data Reviewed   I have personally reviewed the patient's imaging and medical records.    Assessment  Plan 82 year old with multiple comorbidities including COPD, diabetes, dementia and deconditioning. The pancreatitis proceeding to be from gallstones. Unfortunately patient had recurred pancreatitis. We did seem a few weeks ago and he was very debilitated and thought to be a  poor surgical candidate due to his deconditioning. Currently he is delirious and he is disoriented and back again in the very tenuous state.  Patient is to treat pancreatitis medically with supportive care, IV fluids. I do think that he is is that the patient best interest to improve his overall medical condition specifically his functional status and mentation. I do think that if he recovers and he shows signs of progressive improvement in might not be unreasonable to consider elective laparoscopic cholecystectomy. Again I think at this point any surgical intervention is contraindicated given his mental status, debility and multiple comorbidities. We will continue to follow also recommend a short course of nothing by mouth and aggressive crystalloid resuscitation. I have discussed with the son extensively  about the situation and he understands  [facility] Treysen Sudbeck, MD [place] 06/19/2015, 6:12 PM

## 2015-06-19 NOTE — Progress Notes (Signed)
Patient has been alert since late afternoon and is more oriented. At first did not know where he was, but now he does. Patient likes to joke and sometimes answers questions inappropriately on purpose. He remembers nurses names and knows the address to the hospital. No complaints of pain other than a little discomfort in the bed, repositioned. Tolerated clear liquid diet well, and keeps requesting more broth and liquids stating he is hungry. Able to take PO medications well. Sinus rhythm on tele in the 60's-70's. Safety sitter has been discontinued. Will continue to monitor.

## 2015-06-19 NOTE — Clinical Social Work Note (Signed)
Clinical Social Work Assessment  Patient Details  Name: Chase Simmons MRN: [id number] Date of Birth: 1933/12/19  Date of referral:  06/19/15               Reason for consult:  Discharge Planning                Permission sought to share information with:  Family Supports Permission granted to share information::  Yes, Verbal Permission Granted  Name::        Agency::     Relationship::   [name]- Son)  Contact Information:     Housing/Transportation Living arrangements for the past 2 months:  [employer] of Information:  Adult Children Patient Interpreter Needed:  None Criminal Activity/Legal Involvement Pertinent to Current Situation/Hospitalization:  No - Comment as needed Significant Relationships:  Adult Children Lives with:  Facility Resident Do you feel safe going back to the place where you live?  Yes Need for family participation in patient care:  Yes (Comment) [name])  Care giving concerns:  Patient is a STR resident at [facility].    Social Worker assessment / plan:  CSW spoke to [place]- Patient's son over the phone. Per [name] patient has been at [facility] for a few weeks. Reported that he'd like for patient to return. Reported that his sisters have some concerns with the facility that they would like to discuss. Stated that he'd like a SNF bed search to be completed just in case his sisters do not want patient to go back to [facility]. Stated he didn't mind if patient returned to [facility]. Requested this isn't communicated with [facility] because he does not want to loose patient's bed. FL2/ PASRR completed and faxed to SNFs in [facility]. Awaiting bed offers. CSW will continue to follow and assist.    Employment status:  Retired Nurse, adult PT Recommendations:  Not assessed at this time Information / Referral to community resources:     Patient/Family's Response to care:  [name] is in agreement for patient to return to [facility].    Patient/Family's Understanding of and Emotional Response to Diagnosis, Current Treatment, and Prognosis:  [name] is grateful for CSW's assistance.   Emotional Assessment Appearance:  Appears stated age Attitude/Demeanor/Rapport:  Lethargic Affect (typically observed):  Unable to Assess Orientation:   (Unable to Assess) Alcohol / Substance use:  Not Applicable Psych involvement (Current and /or in the community):  No (Comment)  Discharge Needs  Concerns to be addressed:  Discharge Planning Concerns Readmission within the last 30 days:  No Current discharge risk:  Chronically ill Barriers to Discharge:  Continued Medical Work up   [employer], LCSW 06/19/2015, 3:44 PM

## 2015-06-19 NOTE — ED Notes (Signed)
This RN called to give report to floor, RN on unit was busy with another pt. Will call back.

## 2015-06-19 NOTE — Progress Notes (Addendum)
Patient is now alert after coming back from MRCP today. Bedside swallow evaluation passed. Only oriented to self, but patient is having long conversations with safety sitter at bedside. Patient is only impulsive when he has to urinate at this time. Sitter will not be continued on night shift as of status right now.

## 2015-06-19 NOTE — Progress Notes (Signed)
[facility] [place] NAME: Chase Simmons Below    MR#:  [id number]  DATE OF BIRTH:  June 28, 1933  SUBJECTIVE:  CHIEF COMPLAINT:  Patient is resting comfortably. Arousable but falling asleep. Still waiting for MRCP today. Daughter at bedside, reporting patient is having epigastric abdominal pain  REVIEW OF SYSTEMS:  CONSTITUTIONAL: No fever, fatigue or weakness.  EYES: No blurred or double vision.  EARS, NOSE, AND THROAT: No tinnitus or ear pain.  RESPIRATORY: No cough, shortness of breath, wheezing or hemoptysis.  CARDIOVASCULAR: No chest pain, orthopnea, edema.  GASTROINTESTINAL: No nausea, vomiting, diarrhea .Has epigastric abdominal pain.  GENITOURINARY: No dysuria, hematuria.  ENDOCRINE: No polyuria, nocturia,  HEMATOLOGY: No anemia, easy bruising or bleeding SKIN: No rash or lesion. MUSCULOSKELETAL: No joint pain or arthritis.   NEUROLOGIC: No tingling, numbness, weakness.  PSYCHIATRY: No anxiety or depression.   DRUG ALLERGIES:   Allergies  Allergen Reactions  . Latex Itching    VITALS:  Blood pressure 123/60, pulse 63, temperature 97.8 F (36.6 C), temperature source Oral, resp. rate 18, height 5\' 11"  (1.803 m), weight 103.647 kg (228 lb 8 oz), SpO2 97 %.  PHYSICAL EXAMINATION:  GENERAL:  82 y.o.-year-old patient lying in the bed with no acute distress.  EYES: Pupils equal, round, reactive to light and accommodation. No scleral icterus. Extraocular muscles intact.  HEENT: Head atraumatic, normocephalic. Oropharynx and nasopharynx clear.  NECK:  Supple, no jugular venous distention. No thyroid enlargement, no tenderness.  LUNGS: Normal breath sounds bilaterally, no wheezing, rales,rhonchi or crepitation. No use of accessory muscles of respiration.  CARDIOVASCULAR: S1, S2 normal. No murmurs, rubs, or gallops.  ABDOMEN: Soft, Epigastric abdominal discomfort no rebound tenderness, nondistended. Bowel sounds present. No  organomegaly or mass.  EXTREMITIES: No pedal edema, cyanosis, or clubbing.  NEUROLOGIC: Cranial nerves II through XII are intact. Muscle strength 5/5 in all extremities. Sensation intact. Gait not checked.  PSYCHIATRIC: The patient is alert and oriented x 3.  SKIN: No obvious rash, lesion, or ulcer.    LABORATORY PANEL:   CBC  Recent Labs Lab 06/19/15 0458  WBC 9.0  HGB 11.2*  HCT 32.8*  PLT 208   ------------------------------------------------------------------------------------------------------------------  Chemistries   Recent Labs Lab 06/19/15 0458  NA 132*  K 3.3*  CL 98*  CO2 27  GLUCOSE 136*  BUN 15  CREATININE 0.98  CALCIUM 7.9*  AST 38  ALT 35  ALKPHOS 58  BILITOT 0.9   ------------------------------------------------------------------------------------------------------------------  Cardiac Enzymes  Recent Labs Lab 06/18/15 1328  TROPONINI 0.05*   ------------------------------------------------------------------------------------------------------------------  RADIOLOGY:  Ct Head Wo Contrast  06/18/2015  CLINICAL DATA:  Increased confusion.  Urinary tract infection. EXAM: CT HEAD WITHOUT CONTRAST TECHNIQUE: Contiguous axial images were obtained from the base of the skull through the vertex without intravenous contrast. COMPARISON:  04/20/2015 FINDINGS: Again noted is low-density in the white matter which is similar to the previous examination. There is mild cerebral atrophy. No evidence for acute hemorrhage, mass lesion, midline shift, hydrocephalus or large infarct. Visualized paranasal sinuses are clear. No calvarial fracture. IMPRESSION: No acute intracranial abnormality. Stable atrophy and evidence for chronic small vessel ischemic changes. Electronically Signed   By: Markus Daft M.D.   On: 06/18/2015 17:38   Ct Abdomen Pelvis W Contrast  06/18/2015  CLINICAL DATA:  Urinary tract infection. Confusion. Leukocytosis. Abdominal pain. EXAM: CT  ABDOMEN AND PELVIS WITH CONTRAST TECHNIQUE: Multidetector CT imaging of the abdomen and pelvis was performed using the standard  protocol following bolus administration of intravenous contrast. CONTRAST:  116mL ISOVUE-300 IOPAMIDOL (ISOVUE-300) INJECTION 61% COMPARISON:  06/18/2015 ultrasound and 06/04/2015 CT. FINDINGS: The patient was combative, and scanned with he is all arms over his upper abdomen, introducing streak artifact and poor signal to noise ratio which lowers diagnostic sensitivity and specificity. Lower chest: Trace right pleural effusion. Mild airway thickening. Coronary artery atherosclerotic calcification Hepatobiliary: 1.3 by 1.2 cm hypodense lesion posteriorly in the right hepatic lobe image 29/2, previously not well seen but probably present faint dependent density in the gallbladder probably from gallstones as on image 33/2. Pancreas: New abnormal stranding around the pancreatic head favoring focal pancreatitis. Spleen: Unremarkable aside from an accessory spleen. Adrenals/Urinary Tract: Stable 1.6 cm left mid kidney cyst on image 39/2. Otherwise normal. Adrenal glands normal. Stomach/Bowel: Periampullary duodenal diverticulum. Sigmoid diverticulosis. Vascular/Lymphatic: Aortoiliac atherosclerotic vascular disease. Mildly enlarged peripancreatic lymph nodes including a 1.4 cm peripancreatic lymph node on image 31/2 (formerly 0.8 cm). These are most likely reactive. Reproductive: Small prostate gland. Other: Hernia mesh along the anterior abdominal wall. Musculoskeletal: Lower thoracic and lumbar spondylosis and degenerative disc disease. Moderate degenerative hip arthropathy bilaterally. IMPRESSION: 1. Abnormal peripancreatic stranding especially around the pancreatic head, favoring focal pancreatitis. Correlate with pancreatic enzyme levels. There is also a small adjacent periampullary duodenal diverticulum. 2. Suspected gallstones. Today' s exam does not rule out the possibility of  choledocholithiasis although no intrahepatic biliary dilatation is seen. 3. Peripancreatic lymph nodes are mildly enlarged and likely reactive. 4. Trace right pleural effusion. 5. Airway thickening is present, suggesting bronchitis or reactive airways disease. 6. Coronary artery atherosclerosis. Aortoiliac atherosclerotic vascular disease. Electronically Signed   By: Van Clines M.D.   On: 06/18/2015 20:01   Dg Chest Port 1 View  06/18/2015  CLINICAL DATA:  Elevated troponin EXAM: PORTABLE CHEST 1 VIEW COMPARISON:  01/08/2014 FINDINGS: Cardiac shadow is within normal limits. The lungs are well aerated bilaterally. Mild bibasilar atelectatic changes are seen. No bony abnormality is noted. IMPRESSION: Mild bibasilar atelectasis Electronically Signed   By: Inez Catalina M.D.   On: 06/18/2015 19:05   US Abdomen Limited Ruq  06/18/2015  CLINICAL DATA:  Abdominal pain for 2 weeks. History of pancreatitis, gallstones. EXAM: US ABDOMEN LIMITED - RIGHT UPPER QUADRANT COMPARISON:  None. FINDINGS: Gallbladder: There are multiple gallstones, largest measuring 7 mm greatest dimension. There is no gallbladder wall thickening, pericholecystic fluid or other secondary signs of acute cholecystitis. Common bile duct: Diameter: Normal at 4 mm. Liver: Portions of the liver are not well seen. No focal lesion identified. Within normal limits in parenchymal echogenicity where seen. IMPRESSION: 1. Cholelithiasis without evidence of acute cholecystitis. 2. No acute findings.  Liver not well seen in its entirety. Electronically Signed   By: Franki Cabot M.D.   On: 06/18/2015 17:16    EKG:   Orders placed or performed during the hospital encounter of 06/04/15  . EKG 12-Lead  . EKG 12-Lead  . EKG 12-Lead  . EKG 12-Lead  . EKG 12-Lead  . EKG 12-Lead  . EKG 12-Lead  . EKG 12-Lead  . EKG 12-Lead  . EKG 12-Lead  . EKG 12-Lead  . EKG 12-Lead  . EKG 12-Lead  . EKG 12-Lead  . EKG 12-Lead  . EKG 12-Lead     ASSESSMENT AND PLAN:   82 year old male admitted for suspected recurrence of gallstone pancreatitis  1. Acute pancreatitis-Secondary to gallstone pancreatitis Meets. septic criteria with leukocytosis and fever at the time of admission. On IV  Zosyn  Patient has evidence of inflammation  on the head of his pancreas on a CT of his abdomen.Marland Kitchen  Pending MRCP to evaluate further for choledocholithiasis. Depending on the results may need to consult GI for possible ERCP if the stone is present. Otherwise will consult general surgery for possible cholecystectomy.  I'll continue him on the IV antibiotics, follow-up a blood culture.  LFTs within normal reference range.  2. Anxiety and agitation.   Xanax  when necessary Haldol for agitation.   3. Lipidemia. Continue atorvastatin 80 mg daily.  4. Non-insulin-dependent diabetes mellitus. Hold glipizide, continue sliding scale  5. Depression. Continue paroxetine 40 mg daily.  6. COPD. Continue Spiriva and DuoNeb.  7. DVT prophylaxis. Heparin sq  3 times a day    All the records are reviewed and case discussed with Care Management/Social Workerr. Management plans discussed with the patient, family and they are in agreement.  CODE STATUS: fc  TOTAL TIME TAKING CARE OF THIS PATIENT: 36  minutes.   POSSIBLE D/C IN 2-3  DAYS, DEPENDING ON CLINICAL CONDITION.  Note: This dictation was prepared with Dragon dictation along with smaller phrase technology. Any transcriptional errors that result from this process are unintentional.   Nicholes Mango M.D on 06/19/2015 at 1:59 PM  Between 7am to 6pm - Pager - [phone] After 6pm go to [email or web address] - password [email or web address] [facility]  Office  [phone]  CC: Primary care physician; Rubbie Battiest, NP

## 2015-06-20 ENCOUNTER — Other Ambulatory Visit: Payer: Self-pay

## 2015-06-20 ENCOUNTER — Inpatient Hospital Stay: Payer: Commercial Managed Care - HMO

## 2015-06-20 DIAGNOSIS — I959 Hypotension, unspecified: Secondary | ICD-10-CM

## 2015-06-20 DIAGNOSIS — R001 Bradycardia, unspecified: Secondary | ICD-10-CM

## 2015-06-20 LAB — COMPREHENSIVE METABOLIC PANEL
ALT: 34 U/L (ref 17–63)
ALT: 36 U/L (ref 17–63)
ALT: 33 U/L (ref 17–63)
ANION GAP: 7 (ref 5–15)
AST: 34 U/L (ref 15–41)
AST: 38 U/L (ref 15–41)
AST: 37 U/L (ref 15–41)
Albumin: 2.6 g/dL — ABNORMAL LOW (ref 3.5–5.0)
Albumin: 2.9 g/dL — ABNORMAL LOW (ref 3.5–5.0)
Albumin: 2.7 g/dL — ABNORMAL LOW (ref 3.5–5.0)
Alkaline Phosphatase: 59 U/L (ref 38–126)
Alkaline Phosphatase: 66 U/L (ref 38–126)
Alkaline Phosphatase: 60 U/L (ref 38–126)
Anion gap: 7 (ref 5–15)
Anion gap: 9 (ref 5–15)
BILIRUBIN TOTAL: 0.7 mg/dL (ref 0.3–1.2)
BILIRUBIN TOTAL: 0.6 mg/dL (ref 0.3–1.2)
BUN: 13 mg/dL (ref 6–20)
BUN: 13 mg/dL (ref 6–20)
BUN: 14 mg/dL (ref 6–20)
CHLORIDE: 103 mmol/L (ref 101–111)
CO2: 28 mmol/L (ref 22–32)
CO2: 28 mmol/L (ref 22–32)
CO2: 24 mmol/L (ref 22–32)
CREATININE: 0.92 mg/dL (ref 0.61–1.24)
Calcium: 8.1 mg/dL — ABNORMAL LOW (ref 8.9–10.3)
Calcium: 8.5 mg/dL — ABNORMAL LOW (ref 8.9–10.3)
Calcium: 8.1 mg/dL — ABNORMAL LOW (ref 8.9–10.3)
Chloride: 101 mmol/L (ref 101–111)
Chloride: 99 mmol/L — ABNORMAL LOW (ref 101–111)
Creatinine, Ser: 1.04 mg/dL (ref 0.61–1.24)
Creatinine, Ser: 1.19 mg/dL (ref 0.61–1.24)
GFR calc Af Amer: >60 mL/min (ref >60)
GFR, EST NON AFRICAN AMERICAN: 56 mL/min — AB (ref >60)
Glucose, Bld: 142 mg/dL — ABNORMAL HIGH (ref 65–99)
Glucose, Bld: 205 mg/dL — ABNORMAL HIGH (ref 65–99)
Glucose, Bld: 176 mg/dL — ABNORMAL HIGH (ref 65–99)
POTASSIUM: 3.7 mmol/L (ref 3.5–5.1)
POTASSIUM: 3.6 mmol/L (ref 3.5–5.1)
POTASSIUM: 3.6 mmol/L (ref 3.5–5.1)
SODIUM: 136 mmol/L (ref 135–145)
Sodium: 136 mmol/L (ref 135–145)
Sodium: 134 mmol/L — ABNORMAL LOW (ref 135–145)
TOTAL PROTEIN: 5.7 g/dL — AB (ref 6.5–8.1)
TOTAL PROTEIN: 6.5 g/dL (ref 6.5–8.1)
Total Bilirubin: 0.7 mg/dL (ref 0.3–1.2)
Total Protein: 5.8 g/dL — ABNORMAL LOW (ref 6.5–8.1)

## 2015-06-20 LAB — CBC
HCT: 37.7 % — ABNORMAL LOW (ref 40.0–52.0)
Hemoglobin: 13 g/dL (ref 13.0–18.0)
MCH: 33.9 pg (ref 26.0–34.0)
MCHC: 34.6 g/dL (ref 32.0–36.0)
MCV: 98.1 fL (ref 80.0–100.0)
PLATELETS: 259 10*3/uL (ref 150–440)
RBC: 3.84 MIL/uL — AB (ref 4.40–5.90)
RDW: 13.5 % (ref 11.5–14.5)
WBC: 7.6 10*3/uL (ref 3.8–10.6)

## 2015-06-20 LAB — LIPID PANEL
CHOL/HDL RATIO: 3.5 ratio
Cholesterol: 85 mg/dL (ref 0–200)
HDL: 24 mg/dL — AB (ref >40)
LDL Cholesterol: 41 mg/dL (ref 0–99)
Triglycerides: 102 mg/dL (ref <150)
VLDL: 20 mg/dL (ref 0–40)

## 2015-06-20 LAB — MAGNESIUM: MAGNESIUM: 1.8 mg/dL (ref 1.7–2.4)

## 2015-06-20 LAB — CBC WITH DIFFERENTIAL/PLATELET
Basophils Absolute: 0.1 10*3/uL (ref 0–0.1)
Basophils Relative: 1 %
EOS ABS: 0.4 10*3/uL (ref 0–0.7)
Eosinophils Relative: 5 %
HEMATOCRIT: 33.8 % — AB (ref 40.0–52.0)
HEMOGLOBIN: 11.6 g/dL — AB (ref 13.0–18.0)
LYMPHS ABS: 0.8 10*3/uL — AB (ref 1.0–3.6)
LYMPHS PCT: 10 %
MCH: 33.5 pg (ref 26.0–34.0)
MCHC: 34.2 g/dL (ref 32.0–36.0)
MCV: 97.9 fL (ref 80.0–100.0)
Monocytes Absolute: 0.9 10*3/uL (ref 0.2–1.0)
Monocytes Relative: 12 %
NEUTROS ABS: 5.4 10*3/uL (ref 1.4–6.5)
NEUTROS PCT: 72 %
Platelets: 202 10*3/uL (ref 150–440)
RBC: 3.45 MIL/uL — AB (ref 4.40–5.90)
RDW: 13.2 % (ref 11.5–14.5)
WBC: 7.6 10*3/uL (ref 3.8–10.6)

## 2015-06-20 LAB — LIPASE, BLOOD
LIPASE: 24 U/L (ref 11–51)
LIPASE: 92 U/L — AB (ref 11–51)

## 2015-06-20 LAB — LACTIC ACID, PLASMA: Lactic Acid, Venous: 3.1 mmol/L (ref 0.5–2.0)

## 2015-06-20 LAB — TROPONIN I: Troponin I: <0.03 ng/mL (ref <0.031)

## 2015-06-20 LAB — BRAIN NATRIURETIC PEPTIDE: B Natriuretic Peptide: 16 pg/mL (ref 0.0–100.0)

## 2015-06-20 LAB — PHOSPHORUS: PHOSPHORUS: 4.2 mg/dL (ref 2.5–4.6)

## 2015-06-20 LAB — AMYLASE: AMYLASE: 93 U/L (ref 28–100)

## 2015-06-20 LAB — AMMONIA: AMMONIA: 24 umol/L (ref 9–35)

## 2015-06-20 LAB — GLUCOSE, CAPILLARY
GLUCOSE-CAPILLARY: 144 mg/dL — AB (ref 65–99)
GLUCOSE-CAPILLARY: 156 mg/dL — AB (ref 65–99)
GLUCOSE-CAPILLARY: 158 mg/dL — AB (ref 65–99)
GLUCOSE-CAPILLARY: 167 mg/dL — AB (ref 65–99)
Glucose-Capillary: 153 mg/dL — ABNORMAL HIGH (ref 65–99)
Glucose-Capillary: 142 mg/dL — ABNORMAL HIGH (ref 65–99)

## 2015-06-20 LAB — FIBRIN DERIVATIVES D-DIMER (ARMC ONLY): Fibrin derivatives D-dimer (ARMC): 4346 — ABNORMAL HIGH (ref 0–499)

## 2015-06-20 MED ORDER — ONDANSETRON HCL 4 MG/2ML IJ SOLN
INTRAMUSCULAR | Status: AC
  Administered 2015-06-20: 4 mg via INTRAVENOUS
  Filled 2015-06-20: qty 2

## 2015-06-20 MED ORDER — METHYLPREDNISOLONE SODIUM SUCC 125 MG IJ SOLR
125.0000 mg | Freq: Once | INTRAMUSCULAR | Status: AC
  Administered 2015-06-20: 125 mg via INTRAVENOUS
  Filled 2015-06-20: qty 2

## 2015-06-20 MED ORDER — ONDANSETRON HCL 4 MG/2ML IJ SOLN
4.0000 mg | Freq: Once | INTRAMUSCULAR | Status: AC
  Administered 2015-06-20: 4 mg via INTRAVENOUS

## 2015-06-20 MED ORDER — SODIUM CHLORIDE 0.9 % IV SOLN
INTRAVENOUS | Status: AC
  Administered 2015-06-20: 16:00:00 via INTRAVENOUS

## 2015-06-20 MED ORDER — ONDANSETRON HCL 4 MG/2ML IJ SOLN: 4.0000 mg | Freq: Four times a day (QID) | INTRAMUSCULAR | Status: DC | PRN

## 2015-06-20 MED ORDER — IPRATROPIUM-ALBUTEROL 0.5-2.5 (3) MG/3ML IN SOLN
3.0000 mL | Freq: Four times a day (QID) | RESPIRATORY_TRACT | Status: DC
  Administered 2015-06-21 (×2): 3 mL via RESPIRATORY_TRACT
  Filled 2015-06-20 (×2): qty 3

## 2015-06-20 MED ORDER — FLEET ENEMA 7-19 GM/118ML RE ENEM: 1.0000 | ENEMA | Freq: Once | RECTAL | Status: DC

## 2015-06-20 MED ORDER — FUROSEMIDE 10 MG/ML IJ SOLN: 20.0000 mg | Freq: Once | INTRAMUSCULAR | Status: DC

## 2015-06-20 NOTE — Progress Notes (Signed)
CC: Gallstone pancreatitis Subjective: No complaints. Patient is disoriented but not agitated and very pleasant  Objective: Vital signs in last 24 hours: Temp:  [97.8 F (36.6 C)-98.1 F (36.7 C)] 98 F (36.7 C) (06/17 0605) Pulse Rate:  [60-70] 60 (06/17 0605) Resp:  [18] 18 (06/17 0605) BP: (123-129)/(60-72) 127/62 mmHg (06/17 0605) SpO2:  [96 %-97 %] 96 % (06/17 0605)    Intake/Output from previous day: 06/16 0701 - 06/17 0700 In: 600 [P.O.:550; IV Piggyback:50] Out: 1150 [Urine:1150] Intake/Output this shift: Total I/O In: 240 [P.O.:240] Out: 250 [Urine:250]  Physical exam: NAD , non toxic Abd: soft, NT no peritonitis Ext Well perfused no edema Neuro: comfortable, no focal deficits , disoriented   Lab Results: CBC   Recent Labs  06/19/15 0458 06/20/15 0542  WBC 9.0 7.6  HGB 11.2* 11.6*  HCT 32.8* 33.8*  PLT 208 202   BMET  Recent Labs  06/19/15 1425 06/20/15 0548  NA 134* 136  K 3.8 3.7  CL 99* 101  CO2 27 28  GLUCOSE 140* 142*  BUN 15 13  CREATININE 0.96 0.92  CALCIUM 8.3* 8.1*   PT/INR No results for input(s): LABPROT, INR in the last 72 hours. ABG No results for input(s): PHART, HCO3 in the last 72 hours.  Invalid input(s): PCO2, PO2  Studies/Results: Ct Head Wo Contrast  06/18/2015  CLINICAL DATA:  Increased confusion.  Urinary tract infection. EXAM: CT HEAD WITHOUT CONTRAST TECHNIQUE: Contiguous axial images were obtained from the base of the skull through the vertex without intravenous contrast. COMPARISON:  04/20/2015 FINDINGS: Again noted is low-density in the white matter which is similar to the previous examination. There is mild cerebral atrophy. No evidence for acute hemorrhage, mass lesion, midline shift, hydrocephalus or large infarct. Visualized paranasal sinuses are clear. No calvarial fracture. IMPRESSION: No acute intracranial abnormality. Stable atrophy and evidence for chronic small vessel ischemic changes. Electronically  Signed   By: Markus Daft M.D.   On: 06/18/2015 17:38   Ct Abdomen Pelvis W Contrast  06/18/2015  CLINICAL DATA:  Urinary tract infection. Confusion. Leukocytosis. Abdominal pain. EXAM: CT ABDOMEN AND PELVIS WITH CONTRAST TECHNIQUE: Multidetector CT imaging of the abdomen and pelvis was performed using the standard protocol following bolus administration of intravenous contrast. CONTRAST:  162mL ISOVUE-300 IOPAMIDOL (ISOVUE-300) INJECTION 61% COMPARISON:  06/18/2015 ultrasound and 06/04/2015 CT. FINDINGS: The patient was combative, and scanned with he is all arms over his upper abdomen, introducing streak artifact and poor signal to noise ratio which lowers diagnostic sensitivity and specificity. Lower chest: Trace right pleural effusion. Mild airway thickening. Coronary artery atherosclerotic calcification Hepatobiliary: 1.3 by 1.2 cm hypodense lesion posteriorly in the right hepatic lobe image 29/2, previously not well seen but probably present faint dependent density in the gallbladder probably from gallstones as on image 33/2. Pancreas: New abnormal stranding around the pancreatic head favoring focal pancreatitis. Spleen: Unremarkable aside from an accessory spleen. Adrenals/Urinary Tract: Stable 1.6 cm left mid kidney cyst on image 39/2. Otherwise normal. Adrenal glands normal. Stomach/Bowel: Periampullary duodenal diverticulum. Sigmoid diverticulosis. Vascular/Lymphatic: Aortoiliac atherosclerotic vascular disease. Mildly enlarged peripancreatic lymph nodes including a 1.4 cm peripancreatic lymph node on image 31/2 (formerly 0.8 cm). These are most likely reactive. Reproductive: Small prostate gland. Other: Hernia mesh along the anterior abdominal wall. Musculoskeletal: Lower thoracic and lumbar spondylosis and degenerative disc disease. Moderate degenerative hip arthropathy bilaterally. IMPRESSION: 1. Abnormal peripancreatic stranding especially around the pancreatic head, favoring focal pancreatitis.  Correlate with pancreatic enzyme levels. There is  also a small adjacent periampullary duodenal diverticulum. 2. Suspected gallstones. Today' s exam does not rule out the possibility of choledocholithiasis although no intrahepatic biliary dilatation is seen. 3. Peripancreatic lymph nodes are mildly enlarged and likely reactive. 4. Trace right pleural effusion. 5. Airway thickening is present, suggesting bronchitis or reactive airways disease. 6. Coronary artery atherosclerosis. Aortoiliac atherosclerotic vascular disease. Electronically Signed   By: Van Clines M.D.   On: 06/18/2015 20:01   Mr Abdomen Mrcp Wo Cm  06/19/2015  CLINICAL DATA:  Acute gallstone pancreatitis. Diabetes. Chills and fever. EXAM: MRI ABDOMEN WITHOUT CONTRAST  (INCLUDING MRCP) TECHNIQUE: Multiplanar multisequence MR imaging of the abdomen was performed. Heavily T2-weighted images of the biliary and pancreatic ducts were obtained, and three-dimensional MRCP images were rendered by post processing. COMPARISON:  06/18/2015 abdominal pelvic CT. FINDINGS: Portions of exam are moderately motion degraded. Lower chest:  Mild cardiomegaly.  Dependent bibasilar atelectasis. Hepatobiliary: Right hepatic lobe 9 mm cyst. Multiple small gallstones, on the order of 6 mm on image 10/series 8. No evidence of acute cholecystitis. No intrahepatic biliary duct dilatation. Normal common duct caliber for age, including at 7 mm on image 32/series 8. No evidence of choledocholithiasis within the proximal and mid common duct. At the level of and just above the ampulla, apparent decreased T2 signal on image 30/series 8 is favored to be artifactual. No convincing evidence of stone in this area on other pulse sequences. Pancreas: Mild edema centered about the pancreatic head is similar given cross modality comparison. Example image 27/series 18. No pancreatic duct dilatation or fluid collection. Spleen: Normal in size, without focal abnormality. Adrenals/Urinary  Tract: Normal adrenal glands. Mild renal cortical thinning bilaterally. An upper pole left renal lesion measures 1.8 cm and is T2 hyperintense. probable complexity within on image 70/ series 17. Similarly, an interpolar right renal T2 hyperintense lesion measures 8 mm and demonstrates probable complexity within on image 76/ series 17. These lesions are not entirely characterized on this noncontrast exam. No hydronephrosis. Stomach/Bowel: Small periampullary duodenal diverticulum. Otherwise normal stomach and abdominal bowel loops. Vascular/Lymphatic: Normal aortic caliber. Prominent porta hepatis nodes are favored to be reactive. Example portacaval 1.2 cm node. Other: No ascites. Musculoskeletal: No acute osseous abnormality. IMPRESSION: 1. Moderately motion degraded exam. 2. Similar peripancreatic edema, consistent with the clinical history of pancreatitis. 3. Cholelithiasis without acute cholecystitis. 4. No biliary duct dilatation. No evidence of choledocholithiasis with mild limitations at and just above the ampulla, as above. 5. Bilateral renal lesions which are likely complex cysts but incompletely characterized. Electronically Signed   By: Abigail Miyamoto M.D.   On: 06/19/2015 14:54   Dg Chest Port 1 View  06/18/2015  CLINICAL DATA:  Elevated troponin EXAM: PORTABLE CHEST 1 VIEW COMPARISON:  01/08/2014 FINDINGS: Cardiac shadow is within normal limits. The lungs are well aerated bilaterally. Mild bibasilar atelectatic changes are seen. No bony abnormality is noted. IMPRESSION: Mild bibasilar atelectasis Electronically Signed   By: Inez Catalina M.D.   On: 06/18/2015 19:05   US Abdomen Limited Ruq  06/18/2015  CLINICAL DATA:  Abdominal pain for 2 weeks. History of pancreatitis, gallstones. EXAM: US ABDOMEN LIMITED - RIGHT UPPER QUADRANT COMPARISON:  None. FINDINGS: Gallbladder: There are multiple gallstones, largest measuring 7 mm greatest dimension. There is no gallbladder wall thickening, pericholecystic  fluid or other secondary signs of acute cholecystitis. Common bile duct: Diameter: Normal at 4 mm. Liver: Portions of the liver are not well seen. No focal lesion identified. Within normal limits in parenchymal  echogenicity where seen. IMPRESSION: 1. Cholelithiasis without evidence of acute cholecystitis. 2. No acute findings.  Liver not well seen in its entirety. Electronically Signed   By: Franki Cabot M.D.   On: 06/18/2015 17:16    Anti-infectives: Anti-infectives    Start     Dose/Rate Route Frequency Ordered Stop   06/18/15 2245  piperacillin-tazobactam (ZOSYN) IVPB 3.375 g     3.375 g 12.5 mL/hr over 240 Minutes Intravenous Every 8 hours 06/18/15 2234     06/18/15 2030  vancomycin (VANCOCIN) IVPB 1000 mg/200 mL premix     1,000 mg 200 mL/hr over 60 Minutes Intravenous  Once 06/18/15 2019 06/19/15 0052   06/18/15 2030  piperacillin-tazobactam (ZOSYN) IVPB 3.375 g     3.375 g 100 mL/hr over 30 Minutes Intravenous  Once 06/18/15 2019 06/18/15 2133      Assessment/Plan:Gallstone pancreatitis on a debilitated male, improving without sign of complications continue medical management  He will need to prove to ourselves that he has been optimized from overall deconditioning before considering cholecystectomy. No need for any surgical intervention at this time May follow him up as an outpatient  Caroleen Hamman, MD, [facility]  06/20/2015

## 2015-06-20 NOTE — Evaluation (Signed)
Physical Therapy Evaluation Patient Details Name: Chase Simmons MRN: [id number] DOB: [birth year] Today's Date: 06/20/2015   History of Present Illness  82 yo male with gallstone related pancreatitis was admitted and surgery is not a current option. Was just in SNF for recent admission, same issue.  PMHx:  COPD, DM, HTN, and bladder cancer.   Clinical Impression  Pt was able to get up to walk with PT after assisting to bedside, pt generally is feeling low energy but became more energetic with activity.  Expect him to respond to therapy well and complete the treatment at SNF with flying colors.  Will follow acutely in hospital to recover mobility as much as possible.    Follow Up Recommendations SNF    Equipment Recommendations  None recommended by PT    Recommendations for Other Services Rehab consult     Precautions / Restrictions Precautions Precautions: Fall Restrictions Weight Bearing Restrictions: No      Mobility  Bed Mobility Overal bed mobility: Needs Assistance Bed Mobility: Supine to Sit     Supine to sit: Min assist;Mod assist     General bed mobility comments: used bed rail and assist under trunk with PT  Transfers Overall transfer level: Needs assistance Equipment used: Rolling walker (2 wheeled);1 person hand held assist Transfers: Sit to/from [facility] Sit to Stand: Min assist Stand pivot transfers: Min assist       General transfer comment: cued hand placement and reminders for safety upon initial standing  Ambulation/Gait Ambulation/Gait assistance: Min assist Ambulation Distance (Feet): 300 Feet Assistive device: Rolling walker (2 wheeled) Gait Pattern/deviations: Step-through pattern;Decreased stride length;Narrow base of support Gait velocity: reduced Gait velocity interpretation: Below normal speed for age/gender General Gait Details: pt was able to maneuver walker with minor tactile and verbal cues  Stairs             Wheelchair Mobility    Modified Rankin (Stroke Patients Only)       Balance Overall balance assessment: History of Falls   Sitting balance-Leahy Scale: Good       Standing balance-Leahy Scale: Fair                               Pertinent Vitals/Pain Pain Assessment: No/denies pain    Home Living Family/patient expects to be discharged to:: Skilled nursing facility Living Arrangements: Alone Available Help at Discharge: Family Type of Home: Independent living facility Home Access: Elevator     Home Layout: One level Home Equipment: Environmental consultant - 2 wheels      Prior Function Level of Independence: Independent with assistive device(s)               Hand Dominance        Extremity/Trunk Assessment   Upper Extremity Assessment: Overall WFL for tasks assessed           Lower Extremity Assessment: Generalized weakness      Cervical / Trunk Assessment: Normal  Communication   Communication: No difficulties  Cognition Arousal/Alertness: Awake/alert Behavior During Therapy: WFL for tasks assessed/performed Overall Cognitive Status: Within Functional Limits for tasks assessed                      General Comments General comments (skin integrity, edema, etc.): Pt is up to walk with better tolerance for movmement now, with increased coordination and is looking forward to recovering to get home.  Has family in  at end of session to discuss plans to return to [facility] for rehab when discharged from hospital    Exercises General Exercises - Lower Extremity Ankle Circles/Pumps: AROM;Both;10 reps Quad Sets: AROM;AAROM;Both;10 reps      Assessment/Plan    PT Assessment Patient needs continued PT services  PT Diagnosis Difficulty walking;Generalized weakness   PT Problem List Decreased strength;Decreased range of motion;Decreased activity tolerance;Decreased balance;Decreased mobility;Decreased coordination;Decreased knowledge of use of  DME;Decreased safety awareness;Cardiopulmonary status limiting activity  PT Treatment Interventions Gait training;Functional mobility training;Therapeutic activities;Therapeutic exercise;Balance training;Neuromuscular re-education;Patient/family education   PT Goals (Current goals can be found in the Care Plan section) Acute Rehab PT Goals Patient Stated Goal: get home    Frequency Min 2X/week   Barriers to discharge        Co-evaluation               End of Session Equipment Utilized During Treatment: Gait belt Activity Tolerance: Patient tolerated treatment well Patient left: in chair;with call bell/phone within reach;with chair alarm set Nurse Communication: Mobility status         Time: [id number] PT Time Calculation (min) (ACUTE ONLY): 27 min   Charges:   PT Evaluation $PT Eval Moderate Complexity: 1 Procedure PT Treatments $Gait Training: 8-22 mins   PT G CodesRamond Dial 2015-07-02, 3:09 PM    Mee Hives, PT MS Acute Rehab Dept. Number: [place] and [place]

## 2015-06-20 NOTE — Progress Notes (Signed)
[facility] NAME: Chase Simmons    MR#:  [id number]  DATE OF BIRTH:  11/30/1933  SUBJECTIVE:  CHIEF COMPLAINT:  Patient is resting comfortably. ArousableTo verbal commands.  Daughter at bedside, patient denies any abdominal pain today  REVIEW OF SYSTEMS:  CONSTITUTIONAL: No fever, fatigue or weakness.  EYES: No blurred or double vision.  EARS, NOSE, AND THROAT: No tinnitus or ear pain.  RESPIRATORY: No cough, shortness of breath, wheezing or hemoptysis.  CARDIOVASCULAR: No chest pain, orthopnea, edema.  GASTROINTESTINAL: No nausea, vomiting, diarrhea .Has epigastric abdominal pain.  GENITOURINARY: No dysuria, hematuria.  ENDOCRINE: No polyuria, nocturia,  HEMATOLOGY: No anemia, easy bruising or bleeding SKIN: No rash or lesion. MUSCULOSKELETAL: No joint pain or arthritis.   NEUROLOGIC: No tingling, numbness, weakness.  PSYCHIATRY: No anxiety or depression.   DRUG ALLERGIES:   Allergies  Allergen Reactions  . Latex Itching    VITALS:  Blood pressure 113/56, pulse 59, temperature 97.9 F (36.6 C), temperature source Oral, resp. rate 18, height 5\' 11"  (1.803 m), weight 103.647 kg (228 lb 8 oz), SpO2 96 %.  PHYSICAL EXAMINATION:  GENERAL:  82 y.o.-year-old patient lying in the bed with no acute distress.  EYES: Pupils equal, round, reactive to light and accommodation. No scleral icterus. Extraocular muscles intact.  HEENT: Head atraumatic, normocephalic. Oropharynx and nasopharynx clear.  NECK:  Supple, no jugular venous distention. No thyroid enlargement, no tenderness.  LUNGS: Normal breath sounds bilaterally, no wheezing, rales,rhonchi or crepitation. No use of accessory muscles of respiration.  CARDIOVASCULAR: S1, S2 normal. No murmurs, rubs, or gallops.  ABDOMEN: Soft, Epigastric abdominal discomfort no rebound tenderness, nondistended. Bowel sounds present. No organomegaly or mass.  EXTREMITIES: No pedal edema,  cyanosis, or clubbing.  NEUROLOGIC: Cranial nerves II through XII are intact. Muscle strength 5/5 in all extremities. Sensation intact. Gait not checked.  PSYCHIATRIC: The patient is alert and oriented x 2-3.  SKIN: No obvious rash, lesion, or ulcer.    LABORATORY PANEL:   CBC  Recent Labs Lab 06/20/15 0542  WBC 7.6  HGB 11.6*  HCT 33.8*  PLT 202   ------------------------------------------------------------------------------------------------------------------  Chemistries   Recent Labs Lab 06/20/15 0548  NA 136  K 3.7  CL 101  CO2 28  GLUCOSE 142*  BUN 13  CREATININE 0.92  CALCIUM 8.1*  AST 34  ALT 34  ALKPHOS 59  BILITOT 0.7   ------------------------------------------------------------------------------------------------------------------  Cardiac Enzymes  Recent Labs Lab 06/18/15 1328  TROPONINI 0.05*   ------------------------------------------------------------------------------------------------------------------  RADIOLOGY:  Ct Head Wo Contrast  06/18/2015  CLINICAL DATA:  Increased confusion.  Urinary tract infection. EXAM: CT HEAD WITHOUT CONTRAST TECHNIQUE: Contiguous axial images were obtained from the base of the skull through the vertex without intravenous contrast. COMPARISON:  04/20/2015 FINDINGS: Again noted is low-density in the white matter which is similar to the previous examination. There is mild cerebral atrophy. No evidence for acute hemorrhage, mass lesion, midline shift, hydrocephalus or large infarct. Visualized paranasal sinuses are clear. No calvarial fracture. IMPRESSION: No acute intracranial abnormality. Stable atrophy and evidence for chronic small vessel ischemic changes. Electronically Signed   By: Markus Daft M.D.   On: 06/18/2015 17:38   Ct Abdomen Pelvis W Contrast  06/18/2015  CLINICAL DATA:  Urinary tract infection. Confusion. Leukocytosis. Abdominal pain. EXAM: CT ABDOMEN AND PELVIS WITH CONTRAST TECHNIQUE: Multidetector  CT imaging of the abdomen and pelvis was performed using the standard protocol following bolus administration of intravenous  contrast. CONTRAST:  131mL ISOVUE-300 IOPAMIDOL (ISOVUE-300) INJECTION 61% COMPARISON:  06/18/2015 ultrasound and 06/04/2015 CT. FINDINGS: The patient was combative, and scanned with he is all arms over his upper abdomen, introducing streak artifact and poor signal to noise ratio which lowers diagnostic sensitivity and specificity. Lower chest: Trace right pleural effusion. Mild airway thickening. Coronary artery atherosclerotic calcification Hepatobiliary: 1.3 by 1.2 cm hypodense lesion posteriorly in the right hepatic lobe image 29/2, previously not well seen but probably present faint dependent density in the gallbladder probably from gallstones as on image 33/2. Pancreas: New abnormal stranding around the pancreatic head favoring focal pancreatitis. Spleen: Unremarkable aside from an accessory spleen. Adrenals/Urinary Tract: Stable 1.6 cm left mid kidney cyst on image 39/2. Otherwise normal. Adrenal glands normal. Stomach/Bowel: Periampullary duodenal diverticulum. Sigmoid diverticulosis. Vascular/Lymphatic: Aortoiliac atherosclerotic vascular disease. Mildly enlarged peripancreatic lymph nodes including a 1.4 cm peripancreatic lymph node on image 31/2 (formerly 0.8 cm). These are most likely reactive. Reproductive: Small prostate gland. Other: Hernia mesh along the anterior abdominal wall. Musculoskeletal: Lower thoracic and lumbar spondylosis and degenerative disc disease. Moderate degenerative hip arthropathy bilaterally. IMPRESSION: 1. Abnormal peripancreatic stranding especially around the pancreatic head, favoring focal pancreatitis. Correlate with pancreatic enzyme levels. There is also a small adjacent periampullary duodenal diverticulum. 2. Suspected gallstones. Today' s exam does not rule out the possibility of choledocholithiasis although no intrahepatic biliary dilatation is  seen. 3. Peripancreatic lymph nodes are mildly enlarged and likely reactive. 4. Trace right pleural effusion. 5. Airway thickening is present, suggesting bronchitis or reactive airways disease. 6. Coronary artery atherosclerosis. Aortoiliac atherosclerotic vascular disease. Electronically Signed   By: Van Clines M.D.   On: 06/18/2015 20:01   Mr Abdomen Mrcp Wo Cm  06/19/2015  CLINICAL DATA:  Acute gallstone pancreatitis. Diabetes. Chills and fever. EXAM: MRI ABDOMEN WITHOUT CONTRAST  (INCLUDING MRCP) TECHNIQUE: Multiplanar multisequence MR imaging of the abdomen was performed. Heavily T2-weighted images of the biliary and pancreatic ducts were obtained, and three-dimensional MRCP images were rendered by post processing. COMPARISON:  06/18/2015 abdominal pelvic CT. FINDINGS: Portions of exam are moderately motion degraded. Lower chest:  Mild cardiomegaly.  Dependent bibasilar atelectasis. Hepatobiliary: Right hepatic lobe 9 mm cyst. Multiple small gallstones, on the order of 6 mm on image 10/series 8. No evidence of acute cholecystitis. No intrahepatic biliary duct dilatation. Normal common duct caliber for age, including at 7 mm on image 32/series 8. No evidence of choledocholithiasis within the proximal and mid common duct. At the level of and just above the ampulla, apparent decreased T2 signal on image 30/series 8 is favored to be artifactual. No convincing evidence of stone in this area on other pulse sequences. Pancreas: Mild edema centered about the pancreatic head is similar given cross modality comparison. Example image 27/series 18. No pancreatic duct dilatation or fluid collection. Spleen: Normal in size, without focal abnormality. Adrenals/Urinary Tract: Normal adrenal glands. Mild renal cortical thinning bilaterally. An upper pole left renal lesion measures 1.8 cm and is T2 hyperintense. probable complexity within on image 70/ series 17. Similarly, an interpolar right renal T2 hyperintense  lesion measures 8 mm and demonstrates probable complexity within on image 76/ series 17. These lesions are not entirely characterized on this noncontrast exam. No hydronephrosis. Stomach/Bowel: Small periampullary duodenal diverticulum. Otherwise normal stomach and abdominal bowel loops. Vascular/Lymphatic: Normal aortic caliber. Prominent porta hepatis nodes are favored to be reactive. Example portacaval 1.2 cm node. Other: No ascites. Musculoskeletal: No acute osseous abnormality. IMPRESSION: 1. Moderately motion degraded exam. 2.  Similar peripancreatic edema, consistent with the clinical history of pancreatitis. 3. Cholelithiasis without acute cholecystitis. 4. No biliary duct dilatation. No evidence of choledocholithiasis with mild limitations at and just above the ampulla, as above. 5. Bilateral renal lesions which are likely complex cysts but incompletely characterized. Electronically Signed   By: Abigail Miyamoto M.D.   On: 06/19/2015 14:54   Dg Chest Port 1 View  06/18/2015  CLINICAL DATA:  Elevated troponin EXAM: PORTABLE CHEST 1 VIEW COMPARISON:  01/08/2014 FINDINGS: Cardiac shadow is within normal limits. The lungs are well aerated bilaterally. Mild bibasilar atelectatic changes are seen. No bony abnormality is noted. IMPRESSION: Mild bibasilar atelectasis Electronically Signed   By: Inez Catalina M.D.   On: 06/18/2015 19:05   US Abdomen Limited Ruq  06/18/2015  CLINICAL DATA:  Abdominal pain for 2 weeks. History of pancreatitis, gallstones. EXAM: US ABDOMEN LIMITED - RIGHT UPPER QUADRANT COMPARISON:  None. FINDINGS: Gallbladder: There are multiple gallstones, largest measuring 7 mm greatest dimension. There is no gallbladder wall thickening, pericholecystic fluid or other secondary signs of acute cholecystitis. Common bile duct: Diameter: Normal at 4 mm. Liver: Portions of the liver are not well seen. No focal lesion identified. Within normal limits in parenchymal echogenicity where seen. IMPRESSION: 1.  Cholelithiasis without evidence of acute cholecystitis. 2. No acute findings.  Liver not well seen in its entirety. Electronically Signed   By: Franki Cabot M.D.   On: 06/18/2015 17:16    EKG:   Orders placed or performed during the hospital encounter of 06/04/15  . EKG 12-Lead  . EKG 12-Lead  . EKG 12-Lead  . EKG 12-Lead  . EKG 12-Lead  . EKG 12-Lead  . EKG 12-Lead  . EKG 12-Lead  . EKG 12-Lead  . EKG 12-Lead  . EKG 12-Lead  . EKG 12-Lead  . EKG 12-Lead  . EKG 12-Lead  . EKG 12-Lead  . EKG 12-Lead    ASSESSMENT AND PLAN:   82 year old male admitted for suspected recurrence of gallstone pancreatitis  1. Acute pancreatitis-Secondary to gallstone pancreatitis Meets. septic criteria with leukocytosis and fever at the time of admission. On IV Zosyn  Patient has evidence of inflammation  on the head of his pancreas on a CT of his abdomen.Marland Kitchen  MRCP reviewed Surgery is recommending conservative management at this time and not considering any surgical interventions in view of his baseline mental status Continue IV fluids and pain management as needed I'll continue him on the IV antibiotics Negative blood cultures so far LFTs within normal reference range.  2. Anxiety and agitation.   Xanax  when necessary Haldol for agitation.   3. Lipidemia. Continue atorvastatin 80 mg daily.  4. Non-insulin-dependent diabetes mellitus. Hold glipizide, continue sliding scale  5. Depression. Continue paroxetine 40 mg daily.  6. COPD. Continue Spiriva and DuoNeb.  7. DVT prophylaxis. Heparin sq  3 times a day    All the records are reviewed and case discussed with Care Management/Social Workerr. Management plans discussed with the patient, daughter and they are in agreement.  CODE STATUS: fc  TOTAL TIME TAKING CARE OF THIS PATIENT: 36  minutes.   POSSIBLE D/C IN 2-3  DAYS, DEPENDING ON CLINICAL CONDITION.  Note: This dictation was prepared with Dragon dictation along with smaller  phrase technology. Any transcriptional errors that result from this process are unintentional.   Nicholes Mango M.D on 06/20/2015 at 1:46 PM  Between 7am to 6pm - Pager - [phone] After 6pm go to [email or web address] - password  [facility]  [facility]  Office  [phone]  CC: Primary care physician; Rubbie Battiest, NP

## 2015-06-20 NOTE — Progress Notes (Signed)
Daughter [name] [phone]) made aware of change in condition.  Daughter enroute to hospital after telephone call.

## 2015-06-20 NOTE — Progress Notes (Signed)
Clinical Education officer, museum (CSW) received call from RN stating that patient's son would like to speak with CSW. CSW contacted patient's son Chase Simmons. Chase Simmons inquired about bed offer to [facility]. CSW explained that [facility] did not make a bed offer. CSW made Chase Simmons aware that the only offer at this time is [facility]. Chase Simmons is agreeable for patient to return to [facility]. CSW will continue to follow and assist as needed.   Blima Rich, LCSW [phone]

## 2015-06-20 NOTE — Consult Note (Signed)
PULMONARY / CRITICAL CARE MEDICINE   Name: Chase Simmons MRN: [id number] DOB: April 22, 1933    ADMISSION DATE:  06/18/2015   CONSULTATION DATE:  06/20/2015  REFERRING MD:  Hospitalists  CHIEF COMPLAINT: CODE BLUE for Loss of consciousness  HISTORY OF PRESENT ILLNESS:   This is an 82 year old Caucasian male with a past medical history of COPD, bladder cancer, diabetes mellitus, cardiac arrhythmia, hypertension, and conversion disorder with seizures/convulsions who initially presented to the emergency room with altered mental status, fever, chills and abdominal pain. He was admitted for suspected recurrence of gallstone pancreatitis. He was started on Zosyn and an MRI of his abdomen was obtained on 06/20/2015 that showed peri-pancreatic edema consistent with clinical pancreatitis, cholelithiasis without acute cholecystitis, and no biliary duct obstruction. This evening at about 2000, a CODE BLUE was called because patient was found unresponsive. Per patient's nurse, patient complained of epigastric pain and she went to get him some pain medication. Upon her return into the room, patient was unresponsive to voice, touch and noxious stimulus, hence, a CODE BLUE was called. Upon the code team's arrival, the patient had a pulse and was agonally breathing with a respiratory rate of about 8 breaths per minute. BVM ventilation was initiated and patient was also given a sternal rub. His systolic blood pressure was in the 50s and he was bradycardic with a heart rate in the low 50s as well. He gradually regained consciousness and was transferred to the ICU. PCCM was consulted for further management He remains mildly hypotensive with systolic blood pressures in the 90s, but his heart rate is stable between 60 and 70 bpm. He is fully alert and complaining of mild abdominal pain, mostly around the left lower and upper quadrants. The patient's family, he does not have a history of seizures, head injury, previous CVA  or TIAs. Of note, patient was recently hospitalized and treated for gallstone pancreatitis. He was evaluated by surgery and now medical management was recommended.  PAST MEDICAL HISTORY :  He  has a past medical history of Cancer ([place]) (pt unsure of year); Diabetes mellitus without complication ([place]); COPD (chronic obstructive pulmonary disease) ([place]); Allergy; Arrhythmia; Hypertension; Hyperlipidemia; Urinary incontinence; UTI (lower urinary tract infection); Tremor, essential; Vitamin D deficiency; and Conversion disorder with seizures or convulsions.  PAST SURGICAL HISTORY: He  has past surgical history that includes bladder cancer; Cataract extraction; and Hernia repair.  Allergies  Allergen Reactions  . Latex Itching    No current facility-administered medications on file prior to encounter.   Current Outpatient Prescriptions on File Prior to Encounter  Medication Sig  . ALPRAZolam (XANAX) 0.25 MG tablet take 1 tablet by mouth twice a day if needed for anxiety  . atorvastatin (LIPITOR) 80 MG tablet TAKE 1 TABLET ONE TIME DAILY  . carvedilol (COREG) 12.5 MG tablet TAKE 1 TABLET  2 (TWO) TIMES DAILY WITH A MEAL.  Marland Kitchen Cholecalciferol (VITAMIN D-1000 MAX ST) 1000 units tablet Take 1,000 mg by mouth daily.   . fexofenadine (ALLEGRA) 180 MG tablet Take 180 mg by mouth daily as needed for allergies or rhinitis.  Marland Kitchen glimepiride (AMARYL) 4 MG tablet take 1 tablet by mouth WITH BREAKFAST.  . hydrochlorothiazide (HYDRODIURIL) 12.5 MG tablet Take 1 tablet (12.5 mg total) by mouth daily. (Patient taking differently: Take 12.5 mg by mouth daily as needed. )  . lisinopril (PRINIVIL,ZESTRIL) 10 MG tablet TAKE 1 TABLET EVERY DAY  . meclizine (ANTIVERT) 25 MG tablet Take 1 tablet (25 mg total) by mouth  3 (three) times daily as needed for dizziness or nausea.  . metFORMIN (GLUCOPHAGE) 1000 MG tablet TAKE 1 TABLET TWICE DAILY  WITH  FOOD  . ondansetron (ZOFRAN) 4 MG tablet Take 1 tablet (4 mg total) by  mouth every 8 (eight) hours as needed for nausea or vomiting.  Marland Kitchen PARoxetine (PAXIL) 40 MG tablet Take 1 tablet (40 mg total) by mouth daily.  . primidone (MYSOLINE) 50 MG tablet TAKE 1 TABLET TWICE DAILY  . QUEtiapine (SEROQUEL XR) 50 MG TB24 24 hr tablet Take 1 tablet (50 mg total) by mouth at bedtime.  Marland Kitchen tiotropium (SPIRIVA) 18 MCG inhalation capsule Place 1 capsule (18 mcg total) into inhaler and inhale daily.  Marland Kitchen ALPRAZolam (XANAX) 0.25 MG tablet Take by mouth.  Marland Kitchen ipratropium-albuterol (DUONEB) 0.5-2.5 (3) MG/3ML SOLN Take 3 mLs by nebulization every 6 (six) hours as needed.    FAMILY HISTORY:  His indicated that his mother is deceased. He indicated that his father is deceased. He indicated that his sister is alive. He indicated that his maternal uncle is deceased.   SOCIAL HISTORY: He  reports that he has quit smoking. He has never used smokeless tobacco. He reports that he drinks about 7.2 oz of alcohol per week. He reports that he does not use illicit drugs.  REVIEW OF SYSTEMS:   Constitutional: Negative for fever and chills.  HENT: Negative for congestion and rhinorrhea.  Eyes: Negative for redness and visual disturbance.  Respiratory: Negative for shortness of breath and wheezing, but reports mild congestion.  Cardiovascular: Negative for chest pain and palpitations.  Gastrointestinal: Negative  for nausea , vomiting but positive for abdominal pain and constipation Genitourinary: Negative for dysuria and urgency but positive for incontinence.  Musculoskeletal: Negative for myalgias and arthralgias but positive for generalized weakness.  Skin: Negative for pallor and wound.  Neurological: Negative for dizziness and headaches   SUBJECTIVE:   VITAL SIGNS: BP 82/58 mmHg  Pulse 65  Temp(Src) 99.1 F (37.3 C) (Oral)  Resp 17  Ht 5\' 11"  (1.803 m)  Wt 228 lb 8 oz (103.647 kg)  BMI 31.88 kg/m2  SpO2 97%  HEMODYNAMICS:    VENTILATOR SETTINGS:    INTAKE / OUTPUT: I/O  last 3 completed shifts: In: 1907.5 [P.O.:1630; I.V.:177.5; IV Piggyback:100] Out: 1975 [Urine:1975]  PHYSICAL EXAMINATION: General: Chronically ill-looking, no acute distress Neuro: Alert and oriented to person, place and time, follows commands, moves all extremities, grip strength equal in upper and lower extremities, protrudes in midline HEENT: Normocephalic and atraumatic, PERRLA, oral mucosa moist and pink, trachea midline Cardiovascular: Apical pulse regular, S1, S2 audible, no murmur, regurg or gallop Lungs:  Normal work of breathing, bilateral airflow, expiratory wheezes in right lung fields, no rhonchi Abdomen:  Obese, normal bowel sounds, no palpable organomegaly, no pain on palpation Musculoskeletal: Positive range of motion in upper and lower extremities, no visible deformities. Extremities: +2 pulses, no edema Skin:  Warm and dry  LABS:  BMET  Recent Labs Lab 06/20/15 0548 06/20/15 1406 06/20/15 2136  NA 136 134* 136  K 3.7 3.6 3.6  CL 101 99* 103  CO2 28 28 24   BUN 13 13 14   CREATININE 0.92 1.04 1.19  GLUCOSE 142* 205* 176*    Electrolytes  Recent Labs Lab 06/20/15 0548 06/20/15 1406 06/20/15 2136  CALCIUM 8.1* 8.5* 8.1*  MG  --   --  1.8  PHOS  --   --  4.2    CBC  Recent Labs Lab 06/19/15 0458  06/20/15 0542 06/20/15 2136  WBC 9.0 7.6 7.6  HGB 11.2* 11.6* 13.0  HCT 32.8* 33.8* 37.7*  PLT 208 202 259    Coag's No results for input(s): APTT, INR in the last 168 hours.  Sepsis Markers  Recent Labs Lab 06/18/15 2042 06/20/15 2136  LATICACIDVEN 1.1 3.1*    ABG No results for input(s): PHART, PCO2ART, PO2ART in the last 168 hours.  Liver Enzymes  Recent Labs Lab 06/20/15 0548 06/20/15 1406 06/20/15 2136  AST 34 38 37  ALT 34 36 33  ALKPHOS 59 66 60  BILITOT 0.7 0.6 0.7  ALBUMIN 2.6* 2.9* 2.7*    Cardiac Enzymes  Recent Labs Lab 06/18/15 1328 06/20/15 2136  TROPONINI 0.05* <0.03    Glucose  Recent Labs Lab  06/20/15 0036 06/20/15 0703 06/20/15 1147 06/20/15 1743 06/20/15 2056 06/20/15 2123  GLUCAP 144* 153* 158* 156* 142* 167*    Imaging Dg Abd 1 View  06/20/2015  CLINICAL DATA:  Abdominal pain.  Unable to hold breath. EXAM: ABDOMEN - 1 VIEW COMPARISON:  CT abdomen and pelvis 06/18/2015. FINDINGS: Stool in the rectosigmoid colon with scattered gas throughout the remainder the colon. No small or large bowel distention. No radiopaque stones. Degenerative changes in the spine and hips. IMPRESSION: Nonobstructive bowel gas pattern. Stool in the rectosigmoid colon with gas-filled nondistended remainder colon may indicate stool impaction. Electronically Signed   By: Lucienne Capers M.D.   On: 06/20/2015 22:57   Dg Chest Port 1 View  06/20/2015  CLINICAL DATA:  Dyspnea. EXAM: PORTABLE CHEST 1 VIEW COMPARISON:  06/18/2015 and 01/08/2014 FINDINGS: Lungs are adequately inflated without focal consolidation or effusion. Stable borderline cardiomegaly. Mild calcified plaque over the aortic arch. Remainder of the exam is unchanged. IMPRESSION: No active disease. Electronically Signed   By: Marin Olp M.D.   On: 06/20/2015 21:45    STUDIES:  None  CULTURES: Blood cultures 2: Negative to date Urine culture: Negative to date  ANTIBIOTICS: Zosyn 06/18/2015>  SIGNIFICANT EVENTS: 06/18/2015: ED with abdominal pain, fever, altered mental status; admitted for suspected gallstone pancreatitis  LINES/TUBES: Peripheral IVs  DISCUSSION: 82 year old Caucasian male currently being treated for gallstone pancreatitis, with sudden onset loss of consciousness, hypotension, bradycardia and hypoxia; exact etiology unclear. Patient is now back to his baseline status posts BVM ventilation and IV fluids; still borderline hypotensive, but responding to IV fluid bolus. Possible differential diagnosis include acute PE, acute hypercarbic respiratory failure, sepsis, seizure, and CVA or TIA.  ASSESSMENT /  PLAN:  PULMONARY A: Acute respiratory distress-rule out acute hypercarbic respiratory failure. History of COPD-non-O2 dependent P:   -Patient has been tried. Titrated off BVM to nonrebreather to nasal cannula at 4 L a minute. -Stat ABG and chest x-ray-reviewed. -Monitor respiratory status -CTA chest PE study negative for PE  CARDIOVASCULAR A:  Bradycardia-resolved History of hypertension-now hypotensive P:  -Hemodynamics per ICU protocol -Hold oral antihypertensives until hypotension resolves. -2-D echo  RENAL A:   No acute issues P:   -Trend creatinine -Monitor and replace electrolytes  GASTROINTESTINAL A:   Gallstone pancreatitis. Abdominal pain Constipation with possible fecal impaction on x-ray Elevated lactic acid level-Lactic acid improved with IV fluids P:   -Surgery following-not a surgical candidate per surgery -GI consult for possible ERCP -Fleet enema 1. -MiraLAX 17 g by mouth daily -IV fluids  -Trend lactic acid level  HEMATOLOGIC A:   Elevated d-dimer levels P:  -Bilateral lower extremity Dopplers to rule out DVTs. -CTA chest PE study negative  INFECTIOUS A:   Acute gallstone pancreatitis-currently a febrile P:   -Follow-up cultures -Broad-spectrum antibiotics  ENDOCRINE A:   History of type 2 diabetes P:   -Blood glucose monitoring with sliding scale insulin coverage  NEUROLOGIC A:   Acute loss of consciousness-Now at baseline, exact etiology unclear. Debility P:   -CT head negative for any acute infarct or bleed. -Monitor mental status closely -Continue PT as tolerated   Disposition and Family update: All diagnostic workup negative, except for a mildly elevated lactic acid and d-dimer levels. Patient is currently at baseline. Continue current treatment plan. Further changes in treatment plan pending patient's clinical course and diagnostics. Transferred to [facility] initiated per family's request. However, [facility] declined to accept the  patient on the premise that he is not going to receive a higher level of care compared to the level of care he is currently receiving here. Family updated on all diagnostic workup, current treatment plan and an transferred refusal for [facility]. All questions answered.  Total critical care time spent evaluating patient, reviewing  diagnostic data, communicating with the family and coordinating transfer to [facility] =90 minutes  [place] [facility] ANP-BC Pulmonary and Critical Care Medicine [facility] Pager [phone] or [phone]  06/20/2015, 11:05 PM  PCCM ATTENDING ATTESTATION:  I have evaluated patient with ANP Patria Mane, reviewed database in its entirety and discussed care plan in detail. In addition, this patient was discussed on multidisciplinary rounds.   Important exam findings: Remains somnolent but F/C No distress Chest clear Reg, relatively bradycardic, No M Abd soft and mildly tender diffusely Ext warm, no edema No focal neurological deficits  Major problems addressed by PCCM team: Resp distress - resolved Hypotension - resolved Relative bradycardia Gallstone pancreatitis with minimally elevated lipase   PLAN/REC: Decrease carvedilol Hold other anti-hypertensive meds Transfer to med-surg bed with cardiac monitoring PCCM will sign off. Please call if we can be of further assistance   Merton Border, MD PCCM service Mobile [phone] Pager [phone]

## 2015-06-20 NOTE — Progress Notes (Signed)
Code blue called. Arrived to find code team and ED physician at bedside. Patient was in pain and when nurse came into room to give pain medication patient was unresponsive. SBP in 50's and HR in 50's. Patient reponded to sternal rub and BP and HR  Came. Patient now responsive to some enough to control airway. Patient being transferred to ICU for further evaluation.  Time = 30 min

## 2015-06-20 NOTE — Progress Notes (Signed)
ABG results on 100% NRB mask:  pH 7.36 pCo2 46 PO2 158 sO2 100.3 HCO3 26  RR  + Allen's test

## 2015-06-20 NOTE — Progress Notes (Signed)
Clinical Education officer, museum (CSW) received a call from patient's daughter [name] requesting bed search to extend to [facility]. Per daughter she will consider taking patient back to [facility] if she does not accept any bed offers in [place]. CSW will continue to follow and assist as needed.   Blima Rich, LCSW [phone]

## 2015-06-20 NOTE — Progress Notes (Signed)
Alert and oriented. Disoriented to time this morning. No complaints of pain. Diet advanced to full liquid this evening with no issues. Order to advance diet as tolerated per Dr. Margaretmary Eddy. IV antibiotics continued and fluids started today. Will continue to monitor.

## 2015-06-21 ENCOUNTER — Inpatient Hospital Stay: Payer: Commercial Managed Care - HMO

## 2015-06-21 ENCOUNTER — Encounter: Payer: Self-pay | Admitting: Radiology

## 2015-06-21 ENCOUNTER — Inpatient Hospital Stay
Admit: 2015-06-21 | Discharge: 2015-06-21 | Disposition: A | Discharge status: Home / Self Care | Payer: Commercial Managed Care - HMO | Attending: Adult Health | Admitting: Adult Health

## 2015-06-21 LAB — COMPREHENSIVE METABOLIC PANEL
ALBUMIN: 2.5 g/dL — AB (ref 3.5–5.0)
ALK PHOS: 56 U/L (ref 38–126)
ALT: 29 U/L (ref 17–63)
AST: 28 U/L (ref 15–41)
Anion gap: 7 (ref 5–15)
BILIRUBIN TOTAL: 1.4 mg/dL — AB (ref 0.3–1.2)
BUN: 16 mg/dL (ref 6–20)
CALCIUM: 7.8 mg/dL — AB (ref 8.9–10.3)
CO2: 25 mmol/L (ref 22–32)
Chloride: 104 mmol/L (ref 101–111)
Creatinine, Ser: 1.21 mg/dL (ref 0.61–1.24)
GFR calc Af Amer: >60 mL/min (ref >60)
GFR calc non Af Amer: 55 mL/min — ABNORMAL LOW (ref >60)
GLUCOSE: 204 mg/dL — AB (ref 65–99)
Potassium: 4.1 mmol/L (ref 3.5–5.1)
Sodium: 136 mmol/L (ref 135–145)
TOTAL PROTEIN: 5.5 g/dL — AB (ref 6.5–8.1)

## 2015-06-21 LAB — GLUCOSE, CAPILLARY
GLUCOSE-CAPILLARY: 219 mg/dL — AB (ref 65–99)
GLUCOSE-CAPILLARY: 214 mg/dL — AB (ref 65–99)
Glucose-Capillary: 171 mg/dL — ABNORMAL HIGH (ref 65–99)
Glucose-Capillary: 209 mg/dL — ABNORMAL HIGH (ref 65–99)
Glucose-Capillary: 303 mg/dL — ABNORMAL HIGH (ref 65–99)

## 2015-06-21 LAB — LIPASE, BLOOD: Lipase: 36 U/L (ref 11–51)

## 2015-06-21 LAB — LACTIC ACID, PLASMA: Lactic Acid, Venous: 1.2 mmol/L (ref 0.5–2.0)

## 2015-06-21 LAB — TROPONIN I: Troponin I: 0.04 ng/mL — ABNORMAL HIGH (ref <0.031)

## 2015-06-21 MED ORDER — IOPAMIDOL (ISOVUE-370) INJECTION 76%
75.0000 mL | Freq: Once | INTRAVENOUS | Status: AC | PRN
  Administered 2015-06-21: 75 mL via INTRAVENOUS

## 2015-06-21 MED ORDER — METHYLPREDNISOLONE SODIUM SUCC 125 MG IJ SOLR
60.0000 mg | Freq: Two times a day (BID) | INTRAMUSCULAR | Status: DC
  Administered 2015-06-21: 60 mg via INTRAVENOUS
  Filled 2015-06-21: qty 2

## 2015-06-21 MED ORDER — POLYETHYLENE GLYCOL 3350 17 G PO PACK: 17.0000 g | PACK | Freq: Every day | ORAL | Status: DC

## 2015-06-21 MED ORDER — MORPHINE SULFATE (PF) 2 MG/ML IV SOLN: 2.0000 mg | INTRAVENOUS | Status: DC | PRN

## 2015-06-21 MED ORDER — CARVEDILOL 6.25 MG PO TABS
6.2500 mg | ORAL_TABLET | Freq: Two times a day (BID) | ORAL | Status: DC
  Administered 2015-06-21 – 2015-06-22 (×2): 6.25 mg via ORAL
  Filled 2015-06-21: qty 2
  Filled 2015-06-21: qty 1
  Filled 2015-06-21: qty 2

## 2015-06-21 MED ORDER — LACTATED RINGERS IV SOLN
INTRAVENOUS | Status: DC
  Administered 2015-06-21 – 2015-06-22 (×3): via INTRAVENOUS

## 2015-06-21 MED ORDER — SODIUM CHLORIDE 0.9 % IV SOLN
INTRAVENOUS | Status: DC
  Administered 2015-06-21: 06:00:00 via INTRAVENOUS

## 2015-06-21 MED ORDER — FLEET ENEMA 7-19 GM/118ML RE ENEM
1.0000 | ENEMA | Freq: Once | RECTAL | Status: AC
  Administered 2015-06-21: 1 via RECTAL

## 2015-06-21 MED ORDER — LACTULOSE 10 GM/15ML PO SOLN
30.0000 g | Freq: Once | ORAL | Status: AC
  Administered 2015-06-21: 30 g via ORAL
  Filled 2015-06-21: qty 60

## 2015-06-21 MED ORDER — POLYETHYLENE GLYCOL 3350 17 G PO PACK: 17.0000 g | PACK | Freq: Every day | ORAL | Status: DC | PRN

## 2015-06-21 MED ORDER — CETYLPYRIDINIUM CHLORIDE 0.05 % MT LIQD
7.0000 mL | Freq: Two times a day (BID) | OROMUCOSAL | Status: DC
  Administered 2015-06-21 – 2015-06-22 (×3): 7 mL via OROMUCOSAL

## 2015-06-21 NOTE — Progress Notes (Signed)
CC: Gallstone pancreatitis Subjective: Unresponsive last night, never lost pulse but did require some chest compressions. Hypertensive responsive to crystalloids. Workup so far has been negative He denies any abdominal pain  Objective: Vital signs in last 24 hours: Temp:  [97.8 F (36.6 C)-99.1 F (37.3 C)] 98 F (36.7 C) (06/18 0400) Pulse Rate:  [58-105] 63 (06/18 0700) Resp:  [17-27] 19 (06/18 0700) BP: (56-118)/(41-98) 95/55 mmHg (06/18 0700) SpO2:  [69 %-100 %] 99 % (06/18 0817) Last BM Date: 06/16/15  Intake/Output from previous day: 06/17 0701 - 06/18 0700 In: 1307.5 [P.O.:1080; I.V.:177.5; IV Piggyback:50] Out: 1250 [Urine:1250] Intake/Output this shift:    Physical exam: Debilitated elderly male somnolent  He arouses easily Abd: soft, Nt no peritonitis Ext: well perfused, mild edema Neuro: somnolent, follows commands no focal deficits    Lab Results: CBC   Recent Labs  06/20/15 0542 06/20/15 2136  WBC 7.6 7.6  HGB 11.6* 13.0  HCT 33.8* 37.7*  PLT 202 259   BMET  Recent Labs  06/20/15 2136 06/21/15 0243  NA 136 136  K 3.6 4.1  CL 103 104  CO2 24 25  GLUCOSE 176* 204*  BUN 14 16  CREATININE 1.19 1.21  CALCIUM 8.1* 7.8*   PT/INR No results for input(s): LABPROT, INR in the last 72 hours. ABG No results for input(s): PHART, HCO3 in the last 72 hours.  Invalid input(s): PCO2, PO2  Studies/Results: Dg Abd 1 View  06/20/2015  CLINICAL DATA:  Abdominal pain.  Unable to hold breath. EXAM: ABDOMEN - 1 VIEW COMPARISON:  CT abdomen and pelvis 06/18/2015. FINDINGS: Stool in the rectosigmoid colon with scattered gas throughout the remainder the colon. No small or large bowel distention. No radiopaque stones. Degenerative changes in the spine and hips. IMPRESSION: Nonobstructive bowel gas pattern. Stool in the rectosigmoid colon with gas-filled nondistended remainder colon may indicate stool impaction. Electronically Signed   By: Lucienne Capers M.D.    On: 06/20/2015 22:57   Ct Head Wo Contrast  06/21/2015  CLINICAL DATA:  Loss of consciousness.  Code blue. EXAM: CT HEAD WITHOUT CONTRAST TECHNIQUE: Contiguous axial images were obtained from the base of the skull through the vertex without intravenous contrast. COMPARISON:  Head CT 06/18/2015 FINDINGS: Brain: Stable atrophy and chronic small vessel ischemia.No intracranial hemorrhage, mass effect, or midline shift. No hydrocephalus. The basilar cisterns are patent. No evidence of territorial infarct. No intracranial fluid collection. No cerebral edema. Vascular: No hyperdense vessel or abnormal calcification. Skull:  Calvarium is intact. Sinuses/Orbits: Included paranasal sinuses and mastoid air cells are well aerated. Other: None. IMPRESSION: Stable atrophy and chronic small vessel ischemia. No acute intracranial abnormality. Electronically Signed   By: Jeb Levering M.D.   On: 06/21/2015 01:21   Ct Angio Chest Pe W Or Wo Contrast  06/21/2015  CLINICAL DATA:  Unresponsive patient. Code blue cough. Decreased blood pressure and heart rate. Patient responded to sternal rub and now is responsive. Being transferred to ICU for further evaluation. EXAM: CT ANGIOGRAPHY CHEST WITH CONTRAST TECHNIQUE: Multidetector CT imaging of the chest was performed using the standard protocol during bolus administration of intravenous contrast. Multiplanar CT image reconstructions and MIPs were obtained to evaluate the vascular anatomy. CONTRAST:  75 mL Isovue 370 COMPARISON:  CT abdomen and pelvis 06/18/2015 FINDINGS: Technically adequate study with good opacification of the central and segmental pulmonary arteries. No focal filling defects demonstrated. No evidence of significant pulmonary embolus. Normal heart size. Normal caliber thoracic aorta. No evidence of aortic  dissection. Great vessel origins are patent. Esophagus is mostly decompressed. No significant lymphadenopathy in the chest. Evaluation of lungs is limited due  to respiratory motion artifact. Small bilateral pleural effusions. Atelectasis in both lung bases. Scattered peripheral interstitial changes in the lungs may represent early edema. Mid 7 mm pulmonary nodule in the right lung base. 11 mm nodule in the right upper lung. Non-contrast chest CT at 3-6 months is recommended. If the nodules are stable at time of repeat CT, then future CT at 18-24 months (from today's scan) is considered optional for low-risk patients, but is recommended for high-risk patients. This recommendation follows the consensus statement: Guidelines for Management of Incidental Pulmonary Nodules Detected on CT Images:From the Fleischner Society 2017; published online before print (10.1148/radiol.IJ:2314499). Included portions of the upper abdominal organs are grossly unremarkable. Degenerative changes in the spine. No destructive bone lesions. Review of the MIP images confirms the above findings. IMPRESSION: No evidence of significant pulmonary embolus. Small bilateral pleural effusions with basilar atelectasis. Mild interstitial edema is suggested. Right lung nodules as described. Electronically Signed   By: Lucienne Capers M.D.   On: 06/21/2015 01:18   Mr Abdomen Mrcp Wo Cm  06/19/2015  CLINICAL DATA:  Acute gallstone pancreatitis. Diabetes. Chills and fever. EXAM: MRI ABDOMEN WITHOUT CONTRAST  (INCLUDING MRCP) TECHNIQUE: Multiplanar multisequence MR imaging of the abdomen was performed. Heavily T2-weighted images of the biliary and pancreatic ducts were obtained, and three-dimensional MRCP images were rendered by post processing. COMPARISON:  06/18/2015 abdominal pelvic CT. FINDINGS: Portions of exam are moderately motion degraded. Lower chest:  Mild cardiomegaly.  Dependent bibasilar atelectasis. Hepatobiliary: Right hepatic lobe 9 mm cyst. Multiple small gallstones, on the order of 6 mm on image 10/series 8. No evidence of acute cholecystitis. No intrahepatic biliary duct dilatation. Normal  common duct caliber for age, including at 7 mm on image 32/series 8. No evidence of choledocholithiasis within the proximal and mid common duct. At the level of and just above the ampulla, apparent decreased T2 signal on image 30/series 8 is favored to be artifactual. No convincing evidence of stone in this area on other pulse sequences. Pancreas: Mild edema centered about the pancreatic head is similar given cross modality comparison. Example image 27/series 18. No pancreatic duct dilatation or fluid collection. Spleen: Normal in size, without focal abnormality. Adrenals/Urinary Tract: Normal adrenal glands. Mild renal cortical thinning bilaterally. An upper pole left renal lesion measures 1.8 cm and is T2 hyperintense. probable complexity within on image 70/ series 17. Similarly, an interpolar right renal T2 hyperintense lesion measures 8 mm and demonstrates probable complexity within on image 76/ series 17. These lesions are not entirely characterized on this noncontrast exam. No hydronephrosis. Stomach/Bowel: Small periampullary duodenal diverticulum. Otherwise normal stomach and abdominal bowel loops. Vascular/Lymphatic: Normal aortic caliber. Prominent porta hepatis nodes are favored to be reactive. Example portacaval 1.2 cm node. Other: No ascites. Musculoskeletal: No acute osseous abnormality. IMPRESSION: 1. Moderately motion degraded exam. 2. Similar peripancreatic edema, consistent with the clinical history of pancreatitis. 3. Cholelithiasis without acute cholecystitis. 4. No biliary duct dilatation. No evidence of choledocholithiasis with mild limitations at and just above the ampulla, as above. 5. Bilateral renal lesions which are likely complex cysts but incompletely characterized. Electronically Signed   By: Abigail Miyamoto M.D.   On: 06/19/2015 14:54   Dg Chest Port 1 View  06/20/2015  CLINICAL DATA:  Dyspnea. EXAM: PORTABLE CHEST 1 VIEW COMPARISON:  06/18/2015 and 01/08/2014 FINDINGS: Lungs are  adequately inflated without  focal consolidation or effusion. Stable borderline cardiomegaly. Mild calcified plaque over the aortic arch. Remainder of the exam is unchanged. IMPRESSION: No active disease. Electronically Signed   By: Marin Olp M.D.   On: 06/20/2015 21:45    Anti-infectives: Anti-infectives    Start     Dose/Rate Route Frequency Ordered Stop   06/18/15 2245  piperacillin-tazobactam (ZOSYN) IVPB 3.375 g     3.375 g 12.5 mL/hr over 240 Minutes Intravenous Every 8 hours 06/18/15 2234     06/18/15 2030  vancomycin (VANCOCIN) IVPB 1000 mg/200 mL premix     1,000 mg 200 mL/hr over 60 Minutes Intravenous  Once 06/18/15 2019 06/19/15 0052   06/18/15 2030  piperacillin-tazobactam (ZOSYN) IVPB 3.375 g     3.375 g 100 mL/hr over 30 Minutes Intravenous  Once 06/18/15 2019 06/18/15 2133      Assessment/Plan: Gallstone pancreatitis in a patient that is very debilitated and demented Prohibitive surgical risk continue medical management  Caroleen Hamman, MD, [facility]  06/21/2015

## 2015-06-21 NOTE — Progress Notes (Signed)
Patient began having agitation about 2000. Patient ask for medication for anxiety. Patient removed telemetry and was being impulsive. Patient was administered medication. 10 minutes later patient began complaining of epigastric pain and tried to sit up in the bed. Patient was unable to describe pain and give a rating. Then patient sat back in the bed and did not respond to nurse. Patient's facial color was dusky. Code blue was initiated. Patient was hypotensive and in respiratory distress. Minimal interventions initiated at the bed side and patient awaken. Patient was talking and tried to remove ambu bag from from. Patient was transferred to ICU 13, report given at bedside to [facility].

## 2015-06-21 NOTE — Progress Notes (Signed)
*  PRELIMINARY RESULTS* Echocardiogram 2D Echocardiogram has been performed.  Chase Simmons 06/21/2015, 12:11 PM

## 2015-06-21 NOTE — Progress Notes (Signed)
Pt unable to allow Enema to dwell.

## 2015-06-21 NOTE — Progress Notes (Signed)
[place] [facility] [place] NAME: Chase Simmons    MR#:  [id number]  DATE OF BIRTH:  1933-03-27  SUBJECTIVE:  CHIEF COMPLAINT:  Patient is resting comfortably. Arousable to verbal commands.  Answering questions appropriately Unresponsive last night but never lost his pulse, but had some chest compressions and transferred to intensive care unit for close observation. Currently on 4 L oxygen via nasal cannula.  REVIEW OF SYSTEMS:  CONSTITUTIONAL: No fever, fatigue or weakness.  EYES: No blurred or double vision.  EARS, NOSE, AND THROAT: No tinnitus or ear pain.  RESPIRATORY: No cough, shortness of breath, wheezing or hemoptysis.  CARDIOVASCULAR: No chest pain, orthopnea, edema.  GASTROINTESTINAL: No nausea, vomiting, diarrhea .Has epigastric abdominal pain.  GENITOURINARY: No dysuria, hematuria.  ENDOCRINE: No polyuria, nocturia,  HEMATOLOGY: No anemia, easy bruising or bleeding SKIN: No rash or lesion. MUSCULOSKELETAL: No joint pain or arthritis.   NEUROLOGIC: No tingling, numbness, weakness.  PSYCHIATRY: No anxiety or depression.   DRUG ALLERGIES:   Allergies  Allergen Reactions  . Latex Itching    VITALS:  Blood pressure 109/55, pulse 77, temperature 98 F (36.7 C), temperature source Oral, resp. rate 18, height 5\' 11"  (1.803 m), weight 103.647 kg (228 lb 8 oz), SpO2 98 %.  PHYSICAL EXAMINATION:  GENERAL:  82 y.o.-year-old patient lying in the bed with no acute distress.  EYES: Pupils equal, round, reactive to light and accommodation. No scleral icterus. Extraocular muscles intact.  HEENT: Head atraumatic, normocephalic. Oropharynx and nasopharynx clear.  NECK:  Supple, no jugular venous distention. No thyroid enlargement, no tenderness.  LUNGS: Normal breath sounds bilaterally, no wheezing, rales,rhonchi or crepitation. No use of accessory muscles of respiration.  CARDIOVASCULAR: S1, S2 normal. No murmurs, rubs, or gallops.   ABDOMEN: Soft, Epigastric abdominal discomfort no rebound tenderness, nondistended. Bowel sounds present. No organomegaly or mass.  EXTREMITIES: No pedal edema, cyanosis, or clubbing.  NEUROLOGIC: Cranial nerves II through XII are intact. Muscle strength 5/5 in all extremities. Sensation intact. Gait not checked.  PSYCHIATRIC: The patient is alert and oriented x 2-3.  SKIN: No obvious rash, lesion, or ulcer.    LABORATORY PANEL:   CBC  Recent Labs Lab 06/20/15 2136  WBC 7.6  HGB 13.0  HCT 37.7*  PLT 259   ------------------------------------------------------------------------------------------------------------------  Chemistries   Recent Labs Lab 06/20/15 2136 06/21/15 0243  NA 136 136  K 3.6 4.1  CL 103 104  CO2 24 25  GLUCOSE 176* 204*  BUN 14 16  CREATININE 1.19 1.21  CALCIUM 8.1* 7.8*  MG 1.8  --   AST 37 28  ALT 33 29  ALKPHOS 60 56  BILITOT 0.7 1.4*   ------------------------------------------------------------------------------------------------------------------  Cardiac Enzymes  Recent Labs Lab 06/21/15 0859  TROPONINI <0.03   ------------------------------------------------------------------------------------------------------------------  RADIOLOGY:  Dg Abd 1 View  06/20/2015  CLINICAL DATA:  Abdominal pain.  Unable to hold breath. EXAM: ABDOMEN - 1 VIEW COMPARISON:  CT abdomen and pelvis 06/18/2015. FINDINGS: Stool in the rectosigmoid colon with scattered gas throughout the remainder the colon. No small or large bowel distention. No radiopaque stones. Degenerative changes in the spine and hips. IMPRESSION: Nonobstructive bowel gas pattern. Stool in the rectosigmoid colon with gas-filled nondistended remainder colon may indicate stool impaction. Electronically Signed   By: Lucienne Capers M.D.   On: 06/20/2015 22:57   Ct Head Wo Contrast  06/21/2015  CLINICAL DATA:  Loss of consciousness.  Code blue. EXAM: CT HEAD WITHOUT CONTRAST TECHNIQUE:  Contiguous axial images were obtained from the base of the skull through the vertex without intravenous contrast. COMPARISON:  Head CT 06/18/2015 FINDINGS: Brain: Stable atrophy and chronic small vessel ischemia.No intracranial hemorrhage, mass effect, or midline shift. No hydrocephalus. The basilar cisterns are patent. No evidence of territorial infarct. No intracranial fluid collection. No cerebral edema. Vascular: No hyperdense vessel or abnormal calcification. Skull:  Calvarium is intact. Sinuses/Orbits: Included paranasal sinuses and mastoid air cells are well aerated. Other: None. IMPRESSION: Stable atrophy and chronic small vessel ischemia. No acute intracranial abnormality. Electronically Signed   By: Jeb Levering M.D.   On: 06/21/2015 01:21   Ct Angio Chest [place] W Or Wo Contrast  06/21/2015  CLINICAL DATA:  Unresponsive patient. Code blue cough. Decreased blood pressure and heart rate. Patient responded to sternal rub and now is responsive. Being transferred to ICU for further evaluation. EXAM: CT ANGIOGRAPHY CHEST WITH CONTRAST TECHNIQUE: Multidetector CT imaging of the chest was performed using the standard protocol during bolus administration of intravenous contrast. Multiplanar CT image reconstructions and MIPs were obtained to evaluate the vascular anatomy. CONTRAST:  75 mL Isovue 370 COMPARISON:  CT abdomen and pelvis 06/18/2015 FINDINGS: Technically adequate study with good opacification of the central and segmental pulmonary arteries. No focal filling defects demonstrated. No evidence of significant pulmonary embolus. Normal heart size. Normal caliber thoracic aorta. No evidence of aortic dissection. Great vessel origins are patent. Esophagus is mostly decompressed. No significant lymphadenopathy in the chest. Evaluation of lungs is limited due to respiratory motion artifact. Small bilateral pleural effusions. Atelectasis in both lung bases. Scattered peripheral interstitial changes in the  lungs may represent early edema. Mid 7 mm pulmonary nodule in the right lung base. 11 mm nodule in the right upper lung. Non-contrast chest CT at 3-6 months is recommended. If the nodules are stable at time of repeat CT, then future CT at 18-24 months (from today's scan) is considered optional for low-risk patients, but is recommended for high-risk patients. This recommendation follows the consensus statement: Guidelines for Management of Incidental Pulmonary Nodules Detected on CT Images:From the Fleischner Society 2017; published online before print (10.1148/radiol.IJ:2314499). Included portions of the upper abdominal organs are grossly unremarkable. Degenerative changes in the spine. No destructive bone lesions. Review of the MIP images confirms the above findings. IMPRESSION: No evidence of significant pulmonary embolus. Small bilateral pleural effusions with basilar atelectasis. Mild interstitial edema is suggested. Right lung nodules as described. Electronically Signed   By: Lucienne Capers M.D.   On: 06/21/2015 01:18   Mr Abdomen Mrcp Wo Cm  06/19/2015  CLINICAL DATA:  Acute gallstone pancreatitis. Diabetes. Chills and fever. EXAM: MRI ABDOMEN WITHOUT CONTRAST  (INCLUDING MRCP) TECHNIQUE: Multiplanar multisequence MR imaging of the abdomen was performed. Heavily T2-weighted images of the biliary and pancreatic ducts were obtained, and three-dimensional MRCP images were rendered by post processing. COMPARISON:  06/18/2015 abdominal pelvic CT. FINDINGS: Portions of exam are moderately motion degraded. Lower chest:  Mild cardiomegaly.  Dependent bibasilar atelectasis. Hepatobiliary: Right hepatic lobe 9 mm cyst. Multiple small gallstones, on the order of 6 mm on image 10/series 8. No evidence of acute cholecystitis. No intrahepatic biliary duct dilatation. Normal common duct caliber for age, including at 7 mm on image 32/series 8. No evidence of choledocholithiasis within the proximal and mid common duct. At  the level of and just above the ampulla, apparent decreased T2 signal on image 30/series 8 is favored to be artifactual. No convincing evidence of stone in this  area on other pulse sequences. Pancreas: Mild edema centered about the pancreatic head is similar given cross modality comparison. Example image 27/series 18. No pancreatic duct dilatation or fluid collection. Spleen: Normal in size, without focal abnormality. Adrenals/Urinary Tract: Normal adrenal glands. Mild renal cortical thinning bilaterally. An upper pole left renal lesion measures 1.8 cm and is T2 hyperintense. probable complexity within on image 70/ series 17. Similarly, an interpolar right renal T2 hyperintense lesion measures 8 mm and demonstrates probable complexity within on image 76/ series 17. These lesions are not entirely characterized on this noncontrast exam. No hydronephrosis. Stomach/Bowel: Small periampullary duodenal diverticulum. Otherwise normal stomach and abdominal bowel loops. Vascular/Lymphatic: Normal aortic caliber. Prominent porta hepatis nodes are favored to be reactive. Example portacaval 1.2 cm node. Other: No ascites. Musculoskeletal: No acute osseous abnormality. IMPRESSION: 1. Moderately motion degraded exam. 2. Similar peripancreatic edema, consistent with the clinical history of pancreatitis. 3. Cholelithiasis without acute cholecystitis. 4. No biliary duct dilatation. No evidence of choledocholithiasis with mild limitations at and just above the ampulla, as above. 5. Bilateral renal lesions which are likely complex cysts but incompletely characterized. Electronically Signed   By: Abigail Miyamoto M.D.   On: 06/19/2015 14:54   US Venous Img Lower Bilateral  06/21/2015  CLINICAL DATA:  Lower extremity pain. History of bladder cancer. Patient is anticoagulated. EXAM: BILATERAL LOWER EXTREMITY VENOUS DOPPLER ULTRASOUND TECHNIQUE: Gray-scale sonography with graded compression, as well as color Doppler and duplex ultrasound  were performed to evaluate the lower extremity deep venous systems from the level of the common femoral vein and including the common femoral, femoral, profunda femoral, popliteal and calf veins including the posterior tibial, peroneal and gastrocnemius veins when visible. The superficial great saphenous vein was also interrogated. Spectral Doppler was utilized to evaluate flow at rest and with distal augmentation maneuvers in the common femoral, femoral and popliteal veins. COMPARISON:  None. FINDINGS: RIGHT LOWER EXTREMITY Common Femoral Vein: No evidence of thrombus. Normal compressibility, respiratory phasicity and response to augmentation. Saphenofemoral Junction: No evidence of thrombus. Normal compressibility and flow on color Doppler imaging. Profunda Femoral Vein: No evidence of thrombus. Normal compressibility and flow on color Doppler imaging. Femoral Vein: No evidence of thrombus. Normal compressibility, respiratory phasicity and response to augmentation. Popliteal Vein: No evidence of thrombus. Normal compressibility, respiratory phasicity and response to augmentation. Calf Veins: No evidence of thrombus. Normal compressibility and flow on color Doppler imaging. Superficial Great Saphenous Vein: No evidence of thrombus. Normal compressibility and flow on color Doppler imaging. Venous Reflux:  None. Other Findings:  None. LEFT LOWER EXTREMITY Common Femoral Vein: No evidence of thrombus. Normal compressibility, respiratory phasicity and response to augmentation. Saphenofemoral Junction: No evidence of thrombus. Normal compressibility and flow on color Doppler imaging. Profunda Femoral Vein: No evidence of thrombus. Normal compressibility and flow on color Doppler imaging. Femoral Vein: No evidence of thrombus. Normal compressibility, respiratory phasicity and response to augmentation. Popliteal Vein: No evidence of thrombus. Normal compressibility, respiratory phasicity and response to augmentation. Calf  Veins: No evidence of thrombus. Normal compressibility and flow on color Doppler imaging. Superficial Great Saphenous Vein: No evidence of thrombus. Normal compressibility and flow on color Doppler imaging. Venous Reflux:  None. Other Findings:  None. IMPRESSION: No evidence of deep venous thrombosis. Electronically Signed   By: Fidela Salisbury M.D.   On: 06/21/2015 09:26   Dg Chest Port 1 View  06/20/2015  CLINICAL DATA:  Dyspnea. EXAM: PORTABLE CHEST 1 VIEW COMPARISON:  06/18/2015 and 01/08/2014 FINDINGS: Lungs  are adequately inflated without focal consolidation or effusion. Stable borderline cardiomegaly. Mild calcified plaque over the aortic arch. Remainder of the exam is unchanged. IMPRESSION: No active disease. Electronically Signed   By: Marin Olp M.D.   On: 06/20/2015 21:45    EKG:   Orders placed or performed during the hospital encounter of 06/18/15  . EKG 12-Lead  . EKG 12-Lead  . EKG 12-Lead  . EKG 12-Lead    ASSESSMENT AND PLAN:   82 year old male admitted for suspected recurrence of gallstone pancreatitis  #Acute hypoxic failure with history of COPD Resolved.  Off NRB currently on 4 L of oxygen via nasal cannula Wean off as tolerated Appreciate critical care recommendations and will transfer patient to the floor if okay with the critical care  #Acute pancreatitis-Secondary to gallstone pancreatitis Tolerating liquid diet and advance as tolerated Meets septic criteria with leukocytosis and fever at the time of admission. On IV Zosyn  Patient has evidence of inflammation  on the head of his pancreas on a CT of his abdomen.Marland Kitchen  MRCP reviewed Surgery is recommending conservative management at this time and not considering any surgical interventions in view of his baseline mental status Continue IV fluids and pain management as needed  IV antibiotics Negative blood cultures so far LFTs within normal reference range.  #. Anxiety and agitation.   Xanax  when  necessary Haldol for agitation.   #. Lipidemia. Continue atorvastatin 80 mg daily.    # Non-insulin-dependent diabetes mellitus. Hold glipizide, continue sliding scale  #. Depression. Continue paroxetine 40 mg daily.  #. COPD. Continue Spiriva and DuoNeb.  #  DVT prophylaxis. Heparin sq  3 times a day    All the records are reviewed and case discussed with Care Management/Social Workerr. Management plans discussed with the patient, daughter and they are in agreement.  CODE STATUS: fc  TOTAL TIME TAKING CARE OF THIS PATIENT: 36  minutes.   POSSIBLE D/C IN 2-3  DAYS, DEPENDING ON CLINICAL CONDITION.  Note: This dictation was prepared with Dragon dictation along with smaller phrase technology. Any transcriptional errors that result from this process are unintentional.   Nicholes Mango M.D on 06/21/2015 at 2:06 PM  Between 7am to 6pm - Pager - [phone] After 6pm go to [email or web address] - password [email or web address] [facility]  Office  [phone]  CC: Primary care physician; Rubbie Battiest, NP

## 2015-06-21 NOTE — Progress Notes (Signed)
Hold antihypertensives medications for 24hrs per Tukov,NP.

## 2015-06-21 NOTE — Progress Notes (Signed)
[place] tech here to conduct [place].

## 2015-06-21 NOTE — Progress Notes (Signed)
Dr. Dahlia Byes in to see patient.  No further orders at this time.

## 2015-06-22 LAB — CBC
HEMATOCRIT: 32.9 % — AB (ref 40.0–52.0)
HEMOGLOBIN: 11 g/dL — AB (ref 13.0–18.0)
MCH: 32.7 pg (ref 26.0–34.0)
MCHC: 33.4 g/dL (ref 32.0–36.0)
MCV: 98 fL (ref 80.0–100.0)
Platelets: 213 10*3/uL (ref 150–440)
RBC: 3.35 MIL/uL — ABNORMAL LOW (ref 4.40–5.90)
RDW: 13.2 % (ref 11.5–14.5)
WBC: 12.7 10*3/uL — ABNORMAL HIGH (ref 3.8–10.6)

## 2015-06-22 LAB — ECHOCARDIOGRAM COMPLETE
AV Peak grad: 9 mmHg
AVAREAVTI: 2.31 cm2
AVPKVEL: 148 cm/s
Ao pk vel: 0.56 m/s
Ao-asc: 33 cm
CHL CUP AV PEAK INDEX: 1.04
CHL CUP MV DEC (S): 366
EWDT: 366 ms
FS: 27 % — AB (ref 28–44)
HEIGHTINCHES: 71 in
IV/PV OW: 1.05
LA ID, A-P, ES: 39 mm
LA diam index: 1.75 cm/m2
LAVOLA4C: 55.5 mL
LEFT ATRIUM END SYS DIAM: 39 mm
LV PW d: 11.5 mm — AB (ref 0.6–1.1)
LVOT area: 4.15 cm2
LVOT peak vel: 82.5 cm/s
LVOTD: 23 mm
MV pk A vel: 76.6 m/s
MVPKEVEL: 60.1 m/s
Mean grad: 224 mmHg
WEIGHTICAEL: 3656 [oz_av]

## 2015-06-22 LAB — GLUCOSE, CAPILLARY
GLUCOSE-CAPILLARY: 178 mg/dL — AB (ref 65–99)
GLUCOSE-CAPILLARY: 211 mg/dL — AB (ref 65–99)
GLUCOSE-CAPILLARY: 120 mg/dL — AB (ref 65–99)

## 2015-06-22 LAB — COMPREHENSIVE METABOLIC PANEL
ALBUMIN: 2.9 g/dL — AB (ref 3.5–5.0)
ALK PHOS: 56 U/L (ref 38–126)
ALT: 31 U/L (ref 17–63)
ANION GAP: 10 (ref 5–15)
AST: 31 U/L (ref 15–41)
BILIRUBIN TOTAL: 0.4 mg/dL (ref 0.3–1.2)
BUN: 24 mg/dL — ABNORMAL HIGH (ref 6–20)
CALCIUM: 8.5 mg/dL — AB (ref 8.9–10.3)
CO2: 22 mmol/L (ref 22–32)
Chloride: 103 mmol/L (ref 101–111)
Creatinine, Ser: 1.25 mg/dL — ABNORMAL HIGH (ref 0.61–1.24)
GFR, EST NON AFRICAN AMERICAN: 53 mL/min — AB (ref >60)
Glucose, Bld: 196 mg/dL — ABNORMAL HIGH (ref 65–99)
POTASSIUM: 3.7 mmol/L (ref 3.5–5.1)
Sodium: 135 mmol/L (ref 135–145)
TOTAL PROTEIN: 6.1 g/dL — AB (ref 6.5–8.1)

## 2015-06-22 LAB — BLOOD GAS, ARTERIAL
ACID-BASE DEFICIT: 0.1 mmol/L (ref 0.0–2.0)
Allens test (pass/fail): POSITIVE — AB
BICARBONATE: 26 meq/L (ref 21.0–28.0)
FIO2: 1
O2 Saturation: 100.3 %
PCO2 ART: 46 mmHg (ref 32.0–48.0)
PH ART: 7.36 (ref 7.350–7.450)
PO2 ART: 158 mmHg — AB (ref 83.0–108.0)
Patient temperature: 37

## 2015-06-22 LAB — LIPASE, BLOOD: LIPASE: 22 U/L (ref 11–51)

## 2015-06-22 MED ORDER — DOCUSATE SODIUM 100 MG PO CAPS: 100.0000 mg | ORAL_CAPSULE | Freq: Two times a day (BID) | ORAL | Status: DC

## 2015-06-22 MED ORDER — ACETAMINOPHEN 325 MG PO TABS: 650.0000 mg | ORAL_TABLET | Freq: Four times a day (QID) | ORAL | Status: DC | PRN

## 2015-06-22 MED ORDER — AMOXICILLIN-POT CLAVULANATE 875-125 MG PO TABS: 1.0000 | ORAL_TABLET | Freq: Two times a day (BID) | ORAL | Status: DC

## 2015-06-22 MED ORDER — HYDROCODONE-ACETAMINOPHEN 5-325 MG PO TABS: 1.0000 | ORAL_TABLET | ORAL | Status: DC | PRN

## 2015-06-22 MED ORDER — ALPRAZOLAM 0.25 MG PO TABS: ORAL_TABLET | ORAL | Status: DC

## 2015-06-22 NOTE — Progress Notes (Signed)
LCSW contacted daughter [name] [phone] and reviewed offers thay were made to her father in both [place] and [place]. Daughter stated her father is to return to [facility].  In consultation with [facility] CCU nurse and Gastral intestinal consult and test are to be completed and its unlikely patient to discharge today, daughter was informed and CCU nurse will advise.  LCSW called [facility] and spoke to [employer]. LCSW will text him authorization number. LCSW called Amy at [facility] for authorization.  [employer] [phone]

## 2015-06-22 NOTE — Progress Notes (Signed)
Clinical Social Worker informed that patient will be medically ready to discharge to [facility]. Patient in a agreement with plan. CSW called [facility] to confirm that patient's bed is ready. Provided patient's room number 702 and number to call for report [phone] . All discharge information faxed to [facility]. RN will call report and patient will discharge to [facility] via EMS.   Ernest Pine, MSW, [facility] Social Work Department [phone]

## 2015-06-22 NOTE — Progress Notes (Addendum)
Physical Therapy Treatment Patient Details Name: Chase Simmons MRN: [id number] DOB: 03-13-1933 Today's Date: 06/22/2015    History of Present Illness 82 yo male with gallstone related pancreatitis was admitted and surgery is not a current option. Was just in SNF for recent admission, same issue.  PMHx:  COPD, DM, HTN, and bladder cancer.     PT Comments    Pt with discharge order; CM called regarding pt, requesting pt be seen before discharging to skilled nursing facility. Initiated treatment in [place] unit; pt required transfer to general floor, which therapist assisted with. Treatment continued post transfer. Pt demonstrating improved mobility in general, but demonstrates safety issues with transfers and ambulation. Pt maintains "joking personality" throughout session making education somewhat difficult, as pt tends to talk over attempts to educate and/or correct poor/unsafe techniques. Pt demonstrating improved Bilateral lower extremity strength with exercises and function, but limitation persist. Pt would benefit from continued rehab in skilled nursing facility for progression of strength, endurance, and safety/quality with transfers and ambulation to improve functional mobility. Discussed session with CM.   Follow Up Recommendations  SNF     Equipment Recommendations  None recommended by PT    Recommendations for Other Services       Precautions / Restrictions Restrictions Weight Bearing Restrictions: No    Mobility  Bed Mobility Overal bed mobility: Modified Independent             General bed mobility comments: Icreased time/effort and use of rails  Transfers Overall transfer level: Needs assistance Equipment used: Rolling walker (2 wheeled);1 person hand held assist Transfers: Sit to/from Stand Sit to Stand: Min guard         General transfer comment: Consistent cues/heavy at times due to [facility] and pt's "joking" personality and disregard for what is being instructed.    Ambulation/Gait Ambulation/Gait assistance: Min guard Ambulation Distance (Feet): 50 Feet (performed 2x) Assistive device: Rolling walker (2 wheeled) Gait Pattern/deviations: Step-through pattern;Trunk flexed;Wide base of support;Decreased dorsiflexion - right;Decreased dorsiflexion - left (poor knee extension) Gait velocity: reduced Gait velocity interpretation: Below normal speed for age/gender General Gait Details: Pt requires safety cues, as pt abandons rw at times. Also repetive instruction to roll rw versus lift. Pt intitially lifted with poor control placing back down. Did improved to rolling with improved fluidity of gait.    Stairs            Wheelchair Mobility    Modified Rankin (Stroke Patients Only)       Balance   Sitting-balance support: Feet supported Sitting balance-Leahy Scale: Good     Standing balance support: Bilateral upper extremity supported;Single extremity supported Standing balance-Leahy Scale: Fair (Static stand 1 UE support; knees bent, ant'r shift hip.)                      Cognition Arousal/Alertness: Awake/alert Behavior During Therapy: WFL for tasks assessed/performed Overall Cognitive Status: Within Functional Limits for tasks assessed                      Exercises General Exercises - Lower Extremity Ankle Circles/Pumps: AROM;Both;20 reps;Supine Quad Sets: Strengthening;Both;10 reps;Supine Gluteal Sets: Strengthening;Both;10 reps;Supine Long Arc Quad: AROM;Both;10 reps;Seated (partial range) Heel Slides: AROM;Both;10 reps;Supine Hip ABduction/ADduction: Both;AROM;10 reps;Supine (assist to hold LE to avoid friction)    General Comments        Pertinent Vitals/Pain Pain Assessment: No/denies pain    Home Living  Prior Function            PT Goals (current goals can now be found in the care plan section) Progress towards PT goals: Progressing toward goals    Frequency   Min 2X/week    PT Plan Current plan remains appropriate    Co-evaluation             End of Session Equipment Utilized During Treatment: Gait belt Activity Tolerance: Patient tolerated treatment well Patient left: in bed;with nursing/sitter in room     Time: 1417-1455 PT Time Calculation (min) (ACUTE ONLY): 38 min  Charges:  $Gait Training: 8-22 mins $Therapeutic Exercise: 8-22 mins                    G Codes:      Charlaine Dalton, PTA 06/22/2015, 3:26 PM

## 2015-06-22 NOTE — Progress Notes (Signed)
Patient is discharge to SNF in a stable , report and f/u care given to floor nurse , awaiting pick up by EMS

## 2015-06-22 NOTE — Discharge Summary (Signed)
[facility] NAME: Chase Simmons    MR#:  [id number]  DATE OF BIRTH:  1933/12/14  DATE OF ADMISSION:  06/18/2015 ADMITTING PHYSICIAN: Alesia Richards, MD  DATE OF DISCHARGE: 06/22/15 PRIMARY CARE PHYSICIAN: Rubbie Battiest, NP    ADMISSION DIAGNOSIS:  Abdominal pain [R10.9] Acute renal failure, unspecified acute renal failure type ([place]) [N17.9] Altered mental status, unspecified altered mental status type [R41.82] Acute gallstone pancreatitis [K85.10]  DISCHARGE DIAGNOSIS:  Active Problems:   Acute gallstone pancreatitis   SECONDARY DIAGNOSIS:   Past Medical History  Diagnosis Date  . Cancer [facility]) pt unsure of year    bladder cancer-Dr. Jacqlyn Larsen  . Diabetes mellitus without complication ([place])   . COPD (chronic obstructive pulmonary disease) ([place])   . Allergy   . Arrhythmia   . Hypertension   . Hyperlipidemia   . Urinary incontinence   . UTI (lower urinary tract infection)   . Tremor, essential     sees [facility] neurologist  . Vitamin D deficiency   . Conversion disorder with seizures or convulsions     HOSPITAL COURSE:   82 year old male admitted for suspected recurrence of gallstone pancreatitis  #Acute hypoxic failure with history of COPD Resolved.  Off NRB currently on 4 L of oxygen via nasal cannula, weaned off to R/a  Appreciate critical care recommendations , SIGNED OFF  #Acute pancreatitis-Secondary to gallstone pancreatitis Tolerating  advanced diet as tolerated Meets septic criteria with leukocytosis and fever at the time of admission.  GIVEN IV Zosyn.  dischar with Augmentin po  Patient has evidence of inflammation on the head of his pancreas on a CT of his abdomen.Marland Kitchen  MRCP reviewed,no choledocholithiasis Surgery is recommending conservative management at this time and not considering any surgical interventions in view of his baseline mental status   PROVIDE iv FLUIDSs and pain management as  needed IV antibiotics Negative blood cultures so far LFTs within normal reference range. Op GI F/U   #. Anxiety and agitation.  Xanax when necessary  agitation  #. Lipidemia. Continue atorvastatin 80 mg daily.   # Non-insulin-dependent diabetes mellitus. Hold glipizide, continue sliding scale  #. Depression. Continue paroxetine 40 mg daily.  #. COPD. Continue Spiriva and DuoNeb.  # DVT prophylaxis. Heparin sq 3 times a day    DISCHARGE CONDITIONS:   fair  CONSULTS OBTAINED:  Treatment Team:  Jules Husbands, MD Lucilla Lame, MD   PROCEDURES  None   DRUG ALLERGIES:   Allergies  Allergen Reactions  . Latex Itching    DISCHARGE MEDICATIONS:   Current Discharge Medication List    START taking these medications   Details  acetaminophen (TYLENOL) 325 MG tablet Take 2 tablets (650 mg total) by mouth every 6 (six) hours as needed for mild pain (or Fever >/= 101).    amoxicillin-clavulanate (AUGMENTIN) 875-125 MG tablet Take 1 tablet by mouth 2 (two) times daily. Qty: 10 tablet, Refills: 0    docusate sodium (COLACE) 100 MG capsule Take 1 capsule (100 mg total) by mouth 2 (two) times daily. Qty: 10 capsule, Refills: 0    HYDROcodone-acetaminophen (NORCO/VICODIN) 5-325 MG tablet Take 1 tablet by mouth every 4 (four) hours as needed for moderate pain. Qty: 30 tablet, Refills: 0      CONTINUE these medications which have CHANGED   Details  ALPRAZolam (XANAX) 0.25 MG tablet take 1 tablet by mouth twice a day if needed for anxiety Qty: 30 tablet, Refills: 0  CONTINUE these medications which have NOT CHANGED   Details  atorvastatin (LIPITOR) 80 MG tablet TAKE 1 TABLET ONE TIME DAILY Qty: 90 tablet, Refills: 2    carvedilol (COREG) 12.5 MG tablet TAKE 1 TABLET  2 (TWO) TIMES DAILY WITH A MEAL. Qty: 180 tablet, Refills: 3    Cholecalciferol (VITAMIN D-1000 MAX ST) 1000 units tablet Take 1,000 mg by mouth daily.     fexofenadine (ALLEGRA) 180 MG tablet  Take 180 mg by mouth daily as needed for allergies or rhinitis.    glimepiride (AMARYL) 4 MG tablet take 1 tablet by mouth WITH BREAKFAST. Qty: 30 tablet, Refills: 1    meclizine (ANTIVERT) 25 MG tablet Take 1 tablet (25 mg total) by mouth 3 (three) times daily as needed for dizziness or nausea. Qty: 21 tablet, Refills: 0    metFORMIN (GLUCOPHAGE) 1000 MG tablet TAKE 1 TABLET TWICE DAILY  WITH  FOOD Qty: 180 tablet, Refills: 3    ondansetron (ZOFRAN) 4 MG tablet Take 1 tablet (4 mg total) by mouth every 8 (eight) hours as needed for nausea or vomiting. Qty: 21 tablet, Refills: 0    PARoxetine (PAXIL) 40 MG tablet Take 1 tablet (40 mg total) by mouth daily. Qty: 90 tablet, Refills: 1    primidone (MYSOLINE) 50 MG tablet TAKE 1 TABLET TWICE DAILY Qty: 180 tablet, Refills: 3    QUEtiapine (SEROQUEL XR) 50 MG TB24 24 hr tablet Take 1 tablet (50 mg total) by mouth at bedtime. Qty: 90 each    tiotropium (SPIRIVA) 18 MCG inhalation capsule Place 1 capsule (18 mcg total) into inhaler and inhale daily. Qty: 30 capsule, Refills: 5    ipratropium-albuterol (DUONEB) 0.5-2.5 (3) MG/3ML SOLN Take 3 mLs by nebulization every 6 (six) hours as needed. Qty: 360 mL      STOP taking these medications     hydrochlorothiazide (HYDRODIURIL) 12.5 MG tablet      lisinopril (PRINIVIL,ZESTRIL) 10 MG tablet          DISCHARGE INSTRUCTIONS:    follow-up with primary care physician at the facility in 2-3 day Activity per PT recommendations F/u with SURGERY - dr.Pabone in 1 week  Outpatient follow-up with gastroenterology as needed   DIET:  Diabetic diet  DISCHARGE CONDITION:  [place]  ACTIVITY:  Activity as tolerated  OXYGEN:  Home Oxygen: No.   Oxygen Delivery: room air  DISCHARGE LOCATION:  nursing home   If you experience worsening of your admission symptoms, develop shortness of breath, life threatening emergency, suicidal or homicidal thoughts you must seek medical attention  immediately by calling 911 or calling your MD immediately  if symptoms less severe.  You Must read complete instructions/literature along with all the possible adverse reactions/side effects for all the Medicines you take and that have been prescribed to you. Take any new Medicines after you have completely understood and accpet all the possible adverse reactions/side effects.   Please note  You were cared for by a hospitalist during your hospital stay. If you have any questions about your discharge medications or the care you received while you were in the hospital after you are discharged, you can call the unit and asked to speak with the hospitalist on call if the hospitalist that took care of you is not available. Once you are discharged, your primary care physician will handle any further medical issues. Please note that NO REFILLS for any discharge medications will be authorized once you are discharged, as it is imperative that you  return to your primary care physician (or establish a relationship with a primary care physician if you do not have one) for your aftercare needs so that they can reassess your need for medications and monitor your lab values.     Today  Chief Complaint  Patient presents with  . Altered Mental Status   -pt is feeling better ,Tolerating diet. No shortness of breath. Denies any abdominal pain feels comfortable to go to nursing home for rehabilitation  ROS:  CONSTITUTIONAL: Denies fevers, chills. Denies any fatigue, weakness.  EYES: Denies blurry vision, double vision, eye pain. EARS, NOSE, THROAT: Denies tinnitus, ear pain, hearing loss. RESPIRATORY: Denies cough, wheeze, shortness of breath.  CARDIOVASCULAR: Denies chest pain, palpitations, edema.  GASTROINTESTINAL: Denies nausea, vomiting, diarrhea, abdominal pain. Denies bright red blood per rectum. GENITOURINARY: Denies dysuria, hematuria. ENDOCRINE: Denies nocturia or thyroid problems. HEMATOLOGIC AND  LYMPHATIC: Denies easy bruising or bleeding. SKIN: Denies rash or lesion. MUSCULOSKELETAL: Denies pain in neck, back, shoulder, knees, hips or arthritic symptoms.  NEUROLOGIC: Denies paralysis, paresthesias.  PSYCHIATRIC: Denies anxiety or depressive symptoms.   VITAL SIGNS:  Blood pressure 139/75, pulse 50, temperature 98.8 F (37.1 C), temperature source Oral, resp. rate 16, height 5\' 11"  (1.803 m), weight 103.647 kg (228 lb 8 oz), SpO2 98 %.  I/O:    Intake/Output Summary (Last 24 hours) at 06/22/15 1406 Last data filed at 06/22/15 1200  Gross per 24 hour  Intake      0 ml  Output    600 ml  Net   -600 ml    PHYSICAL EXAMINATION:  GENERAL:  82 y.o.-year-old patient lying in the bed with no acute distress.  EYES: Pupils equal, round, reactive to light and accommodation. No scleral icterus. Extraocular muscles intact.  HEENT: Head atraumatic, normocephalic. Oropharynx and nasopharynx clear.  NECK:  Supple, no jugular venous distention. No thyroid enlargement, no tenderness.  LUNGS: Normal breath sounds bilaterally, no wheezing, rales,rhonchi or crepitation. No use of accessory muscles of respiration.  CARDIOVASCULAR: S1, S2 normal. No murmurs, rubs, or gallops.  ABDOMEN: Soft, non-tender, non-distended. Bowel sounds present. No organomegaly or mass.  EXTREMITIES: No pedal edema, cyanosis, or clubbing.  NEUROLOGIC: Cranial nerves II through XII are intact. Muscle strength 5/5 in all extremities. Sensation intact. Gait not checked.  PSYCHIATRIC: The patient is alert and oriented x 3.  SKIN: No obvious rash, lesion, or ulcer.   DATA REVIEW:   CBC  Recent Labs Lab 06/22/15 0300  WBC 12.7*  HGB 11.0*  HCT 32.9*  PLT 213    Chemistries   Recent Labs Lab 06/20/15 2136  06/22/15 0300  NA 136  < > 135  K 3.6  < > 3.7  CL 103  < > 103  CO2 24  < > 22  GLUCOSE 176*  < > 196*  BUN 14  < > 24*  CREATININE 1.19  < > 1.25*  CALCIUM 8.1*  < > 8.5*  MG 1.8  --   --    AST 37  < > 31  ALT 33  < > 31  ALKPHOS 60  < > 56  BILITOT 0.7  < > 0.4  < > = values in this interval not displayed.  Cardiac Enzymes  Recent Labs Lab 06/21/15 0859  TROPONINI <0.03    Microbiology Results  Results for orders placed or performed during the hospital encounter of 06/18/15  Culture, blood (routine x 2)     Status: None (Preliminary result)  Collection Time: 06/18/15  1:28 PM  Result Value Ref Range Status   Specimen Description BLOOD RIGHT ARM  Final   Special Requests BOTTLES DRAWN AEROBIC AND ANAEROBIC  [place]  Final   Culture NO GROWTH 3 DAYS  Final   Report Status PENDING  Incomplete  Urine culture     Status: None   Collection Time: 06/18/15  1:28 PM  Result Value Ref Range Status   Specimen Description URINE, RANDOM  Final   Special Requests NONE  Final   Culture NO GROWTH Performed at [facility]   Final   Report Status 06/19/2015 FINAL  Final  Culture, blood (routine x 2)     Status: None (Preliminary result)   Collection Time: 06/18/15  4:23 PM  Result Value Ref Range Status   Specimen Description BLOOD RIGHT ASSIST CONTROL  Final   Special Requests BOTTLES DRAWN AEROBIC AND ANAEROBIC 8CC  Final   Culture NO GROWTH 3 DAYS  Final   Report Status PENDING  Incomplete  Surgical pcr screen     Status: Abnormal   Collection Time: 06/19/15  2:20 AM  Result Value Ref Range Status   MRSA, PCR NEGATIVE NEGATIVE Final   Staphylococcus aureus POSITIVE (A) NEGATIVE Final    Comment:        The Xpert SA Assay (FDA approved for NASAL specimens in patients over 69 years of age), is one component of a comprehensive surveillance program.  Test performance has been validated by [facility] for patients greater than or equal to 67 year old. It is not intended to diagnose infection nor to guide or monitor treatment.     RADIOLOGY:  Dg Abd 1 View  06/20/2015  CLINICAL DATA:  Abdominal pain.  Unable to hold breath. EXAM: ABDOMEN - 1 VIEW  COMPARISON:  CT abdomen and pelvis 06/18/2015. FINDINGS: Stool in the rectosigmoid colon with scattered gas throughout the remainder the colon. No small or large bowel distention. No radiopaque stones. Degenerative changes in the spine and hips. IMPRESSION: Nonobstructive bowel gas pattern. Stool in the rectosigmoid colon with gas-filled nondistended remainder colon may indicate stool impaction. Electronically Signed   By: Lucienne Capers M.D.   On: 06/20/2015 22:57   Ct Head Wo Contrast  06/21/2015  CLINICAL DATA:  Loss of consciousness.  Code blue. EXAM: CT HEAD WITHOUT CONTRAST TECHNIQUE: Contiguous axial images were obtained from the base of the skull through the vertex without intravenous contrast. COMPARISON:  Head CT 06/18/2015 FINDINGS: Brain: Stable atrophy and chronic small vessel ischemia.No intracranial hemorrhage, mass effect, or midline shift. No hydrocephalus. The basilar cisterns are patent. No evidence of territorial infarct. No intracranial fluid collection. No cerebral edema. Vascular: No hyperdense vessel or abnormal calcification. Skull:  Calvarium is intact. Sinuses/Orbits: Included paranasal sinuses and mastoid air cells are well aerated. Other: None. IMPRESSION: Stable atrophy and chronic small vessel ischemia. No acute intracranial abnormality. Electronically Signed   By: Jeb Levering M.D.   On: 06/21/2015 01:21   Ct Head Wo Contrast  06/18/2015  CLINICAL DATA:  Increased confusion.  Urinary tract infection. EXAM: CT HEAD WITHOUT CONTRAST TECHNIQUE: Contiguous axial images were obtained from the base of the skull through the vertex without intravenous contrast. COMPARISON:  04/20/2015 FINDINGS: Again noted is low-density in the white matter which is similar to the previous examination. There is mild cerebral atrophy. No evidence for acute hemorrhage, mass lesion, midline shift, hydrocephalus or large infarct. Visualized paranasal sinuses are clear. No calvarial fracture.  IMPRESSION:  No acute intracranial abnormality. Stable atrophy and evidence for chronic small vessel ischemic changes. Electronically Signed   By: Markus Daft M.D.   On: 06/18/2015 17:38   Ct Angio Chest Pe W Or Wo Contrast  06/21/2015  CLINICAL DATA:  Unresponsive patient. Code blue cough. Decreased blood pressure and heart rate. Patient responded to sternal rub and now is responsive. Being transferred to ICU for further evaluation. EXAM: CT ANGIOGRAPHY CHEST WITH CONTRAST TECHNIQUE: Multidetector CT imaging of the chest was performed using the standard protocol during bolus administration of intravenous contrast. Multiplanar CT image reconstructions and MIPs were obtained to evaluate the vascular anatomy. CONTRAST:  75 mL Isovue 370 COMPARISON:  CT abdomen and pelvis 06/18/2015 FINDINGS: Technically adequate study with good opacification of the central and segmental pulmonary arteries. No focal filling defects demonstrated. No evidence of significant pulmonary embolus. Normal heart size. Normal caliber thoracic aorta. No evidence of aortic dissection. Great vessel origins are patent. Esophagus is mostly decompressed. No significant lymphadenopathy in the chest. Evaluation of lungs is limited due to respiratory motion artifact. Small bilateral pleural effusions. Atelectasis in both lung bases. Scattered peripheral interstitial changes in the lungs may represent early edema. Mid 7 mm pulmonary nodule in the right lung base. 11 mm nodule in the right upper lung. Non-contrast chest CT at 3-6 months is recommended. If the nodules are stable at time of repeat CT, then future CT at 18-24 months (from today's scan) is considered optional for low-risk patients, but is recommended for high-risk patients. This recommendation follows the consensus statement: Guidelines for Management of Incidental Pulmonary Nodules Detected on CT Images:From the Fleischner Society 2017; published online before print  (10.1148/radiol.IJ:2314499). Included portions of the upper abdominal organs are grossly unremarkable. Degenerative changes in the spine. No destructive bone lesions. Review of the MIP images confirms the above findings. IMPRESSION: No evidence of significant pulmonary embolus. Small bilateral pleural effusions with basilar atelectasis. Mild interstitial edema is suggested. Right lung nodules as described. Electronically Signed   By: Lucienne Capers M.D.   On: 06/21/2015 01:18   Ct Abdomen Pelvis W Contrast  06/18/2015  CLINICAL DATA:  Urinary tract infection. Confusion. Leukocytosis. Abdominal pain. EXAM: CT ABDOMEN AND PELVIS WITH CONTRAST TECHNIQUE: Multidetector CT imaging of the abdomen and pelvis was performed using the standard protocol following bolus administration of intravenous contrast. CONTRAST:  18mL ISOVUE-300 IOPAMIDOL (ISOVUE-300) INJECTION 61% COMPARISON:  06/18/2015 ultrasound and 06/04/2015 CT. FINDINGS: The patient was combative, and scanned with he is all arms over his upper abdomen, introducing streak artifact and poor signal to noise ratio which lowers diagnostic sensitivity and specificity. Lower chest: Trace right pleural effusion. Mild airway thickening. Coronary artery atherosclerotic calcification Hepatobiliary: 1.3 by 1.2 cm hypodense lesion posteriorly in the right hepatic lobe image 29/2, previously not well seen but probably present faint dependent density in the gallbladder probably from gallstones as on image 33/2. Pancreas: New abnormal stranding around the pancreatic head favoring focal pancreatitis. Spleen: Unremarkable aside from an accessory spleen. Adrenals/Urinary Tract: Stable 1.6 cm left mid kidney cyst on image 39/2. Otherwise normal. Adrenal glands normal. Stomach/Bowel: Periampullary duodenal diverticulum. Sigmoid diverticulosis. Vascular/Lymphatic: Aortoiliac atherosclerotic vascular disease. Mildly enlarged peripancreatic lymph nodes including a 1.4 cm  peripancreatic lymph node on image 31/2 (formerly 0.8 cm). These are most likely reactive. Reproductive: Small prostate gland. Other: Hernia mesh along the anterior abdominal wall. Musculoskeletal: Lower thoracic and lumbar spondylosis and degenerative disc disease. Moderate degenerative hip arthropathy bilaterally. IMPRESSION: 1. Abnormal peripancreatic stranding especially around  the pancreatic head, favoring focal pancreatitis. Correlate with pancreatic enzyme levels. There is also a small adjacent periampullary duodenal diverticulum. 2. Suspected gallstones. Today' s exam does not rule out the possibility of choledocholithiasis although no intrahepatic biliary dilatation is seen. 3. Peripancreatic lymph nodes are mildly enlarged and likely reactive. 4. Trace right pleural effusion. 5. Airway thickening is present, suggesting bronchitis or reactive airways disease. 6. Coronary artery atherosclerosis. Aortoiliac atherosclerotic vascular disease. Electronically Signed   By: Van Clines M.D.   On: 06/18/2015 20:01   Mr Abdomen Mrcp Wo Cm  06/19/2015  CLINICAL DATA:  Acute gallstone pancreatitis. Diabetes. Chills and fever. EXAM: MRI ABDOMEN WITHOUT CONTRAST  (INCLUDING MRCP) TECHNIQUE: Multiplanar multisequence MR imaging of the abdomen was performed. Heavily T2-weighted images of the biliary and pancreatic ducts were obtained, and three-dimensional MRCP images were rendered by post processing. COMPARISON:  06/18/2015 abdominal pelvic CT. FINDINGS: Portions of exam are moderately motion degraded. Lower chest:  Mild cardiomegaly.  Dependent bibasilar atelectasis. Hepatobiliary: Right hepatic lobe 9 mm cyst. Multiple small gallstones, on the order of 6 mm on image 10/series 8. No evidence of acute cholecystitis. No intrahepatic biliary duct dilatation. Normal common duct caliber for age, including at 7 mm on image 32/series 8. No evidence of choledocholithiasis within the proximal and mid common duct. At the  level of and just above the ampulla, apparent decreased T2 signal on image 30/series 8 is favored to be artifactual. No convincing evidence of stone in this area on other pulse sequences. Pancreas: Mild edema centered about the pancreatic head is similar given cross modality comparison. Example image 27/series 18. No pancreatic duct dilatation or fluid collection. Spleen: Normal in size, without focal abnormality. Adrenals/Urinary Tract: Normal adrenal glands. Mild renal cortical thinning bilaterally. An upper pole left renal lesion measures 1.8 cm and is T2 hyperintense. probable complexity within on image 70/ series 17. Similarly, an interpolar right renal T2 hyperintense lesion measures 8 mm and demonstrates probable complexity within on image 76/ series 17. These lesions are not entirely characterized on this noncontrast exam. No hydronephrosis. Stomach/Bowel: Small periampullary duodenal diverticulum. Otherwise normal stomach and abdominal bowel loops. Vascular/Lymphatic: Normal aortic caliber. Prominent porta hepatis nodes are favored to be reactive. Example portacaval 1.2 cm node. Other: No ascites. Musculoskeletal: No acute osseous abnormality. IMPRESSION: 1. Moderately motion degraded exam. 2. Similar peripancreatic edema, consistent with the clinical history of pancreatitis. 3. Cholelithiasis without acute cholecystitis. 4. No biliary duct dilatation. No evidence of choledocholithiasis with mild limitations at and just above the ampulla, as above. 5. Bilateral renal lesions which are likely complex cysts but incompletely characterized. Electronically Signed   By: Abigail Miyamoto M.D.   On: 06/19/2015 14:54   US Venous Img Lower Bilateral  06/21/2015  CLINICAL DATA:  Lower extremity pain. History of bladder cancer. Patient is anticoagulated. EXAM: BILATERAL LOWER EXTREMITY VENOUS DOPPLER ULTRASOUND TECHNIQUE: Gray-scale sonography with graded compression, as well as color Doppler and duplex ultrasound were  performed to evaluate the lower extremity deep venous systems from the level of the common femoral vein and including the common femoral, femoral, profunda femoral, popliteal and calf veins including the posterior tibial, peroneal and gastrocnemius veins when visible. The superficial great saphenous vein was also interrogated. Spectral Doppler was utilized to evaluate flow at rest and with distal augmentation maneuvers in the common femoral, femoral and popliteal veins. COMPARISON:  None. FINDINGS: RIGHT LOWER EXTREMITY Common Femoral Vein: No evidence of thrombus. Normal compressibility, respiratory phasicity and response to augmentation. Saphenofemoral  Junction: No evidence of thrombus. Normal compressibility and flow on color Doppler imaging. Profunda Femoral Vein: No evidence of thrombus. Normal compressibility and flow on color Doppler imaging. Femoral Vein: No evidence of thrombus. Normal compressibility, respiratory phasicity and response to augmentation. Popliteal Vein: No evidence of thrombus. Normal compressibility, respiratory phasicity and response to augmentation. Calf Veins: No evidence of thrombus. Normal compressibility and flow on color Doppler imaging. Superficial Great Saphenous Vein: No evidence of thrombus. Normal compressibility and flow on color Doppler imaging. Venous Reflux:  None. Other Findings:  None. LEFT LOWER EXTREMITY Common Femoral Vein: No evidence of thrombus. Normal compressibility, respiratory phasicity and response to augmentation. Saphenofemoral Junction: No evidence of thrombus. Normal compressibility and flow on color Doppler imaging. Profunda Femoral Vein: No evidence of thrombus. Normal compressibility and flow on color Doppler imaging. Femoral Vein: No evidence of thrombus. Normal compressibility, respiratory phasicity and response to augmentation. Popliteal Vein: No evidence of thrombus. Normal compressibility, respiratory phasicity and response to augmentation. Calf  Veins: No evidence of thrombus. Normal compressibility and flow on color Doppler imaging. Superficial Great Saphenous Vein: No evidence of thrombus. Normal compressibility and flow on color Doppler imaging. Venous Reflux:  None. Other Findings:  None. IMPRESSION: No evidence of deep venous thrombosis. Electronically Signed   By: Fidela Salisbury M.D.   On: 06/21/2015 09:26   Dg Chest Port 1 View  06/20/2015  CLINICAL DATA:  Dyspnea. EXAM: PORTABLE CHEST 1 VIEW COMPARISON:  06/18/2015 and 01/08/2014 FINDINGS: Lungs are adequately inflated without focal consolidation or effusion. Stable borderline cardiomegaly. Mild calcified plaque over the aortic arch. Remainder of the exam is unchanged. IMPRESSION: No active disease. Electronically Signed   By: Marin Olp M.D.   On: 06/20/2015 21:45   Dg Chest Port 1 View  06/18/2015  CLINICAL DATA:  Elevated troponin EXAM: PORTABLE CHEST 1 VIEW COMPARISON:  01/08/2014 FINDINGS: Cardiac shadow is within normal limits. The lungs are well aerated bilaterally. Mild bibasilar atelectatic changes are seen. No bony abnormality is noted. IMPRESSION: Mild bibasilar atelectasis Electronically Signed   By: Inez Catalina M.D.   On: 06/18/2015 19:05   US Abdomen Limited Ruq  06/18/2015  CLINICAL DATA:  Abdominal pain for 2 weeks. History of pancreatitis, gallstones. EXAM: US ABDOMEN LIMITED - RIGHT UPPER QUADRANT COMPARISON:  None. FINDINGS: Gallbladder: There are multiple gallstones, largest measuring 7 mm greatest dimension. There is no gallbladder wall thickening, pericholecystic fluid or other secondary signs of acute cholecystitis. Common bile duct: Diameter: Normal at 4 mm. Liver: Portions of the liver are not well seen. No focal lesion identified. Within normal limits in parenchymal echogenicity where seen. IMPRESSION: 1. Cholelithiasis without evidence of acute cholecystitis. 2. No acute findings.  Liver not well seen in its entirety. Electronically Signed   By: Franki Cabot M.D.   On: 06/18/2015 17:16    EKG:   Orders placed or performed during the hospital encounter of 06/18/15  . EKG 12-Lead  . EKG 12-Lead  . EKG 12-Lead  . EKG 12-Lead      Management plans discussed with the patient, family and they are in agreement.  CODE STATUS:     Code Status Orders        Start     Ordered   06/19/15 0022  Full code   Continuous     06/19/15 0021    Code Status History    Date Active Date Inactive Code Status Order ID Comments User Context   06/04/2015  8:13 PM 06/08/2015  6:33 PM Full Code [id number]  Loletha Grayer, MD ED    Advance Directive Documentation        Most Recent Value   Type of Advance Directive  Living will   Pre-existing out of facility DNR order (yellow form or pink MOST form)     "MOST" Form in Place?        TOTAL TIME TAKING CARE OF THIS PATIENT: 45  minutes.   Note: This dictation was prepared with Dragon dictation along with smaller phrase technology. Any transcriptional errors that result from this process are unintentional.   @[facility] @  on 06/22/2015 at 2:06 PM  Between 7am to 6pm - Pager - [phone]  After 6pm go to [email or web address] - password [email or web address] [facility]  Office  [phone]  CC: Primary care physician; Rubbie Battiest, NP

## 2015-06-22 NOTE — Progress Notes (Addendum)
CSW contacted [facility] represenative. Per Anderson Malta she needs an updated PT note to get insurance auth. CSW contacted MD and informed her of above. Called PT to request another evaluation be completed.   Anderson Malta requested new PT Note to be faxed to her at [phone] to obtain auth. Awaiting PT note to be inputted into EPIC to fax to [facility]. CSW will continue to follow and assist.   Ernest Pine, MSW, [place] Work Department [phone]

## 2015-06-22 NOTE — Discharge Instructions (Signed)
   follow-up with primary care physician at the facility in 2-3 day Activity per PT recommendations F/u with SURGERY - dr.Pabone in 1 week  Outpatient follow-up with gastroenterology as needed

## 2015-06-22 NOTE — Progress Notes (Signed)
Patient is discharged tonight to [facility]. EMS arrived, patient transferred on stretcher. Patient and VS are stabled. Items and paperwork given to EMS.

## 2015-06-22 NOTE — Progress Notes (Signed)
LCSW awaiting call back from [facility] [phone] for auth, as per Amy at [facility]. Patient when discharged will be going by EMS as per daughter.  Santa Abdelrahman LCSW

## 2015-06-23 ENCOUNTER — Telehealth: Payer: Self-pay

## 2015-06-23 LAB — CULTURE, BLOOD (ROUTINE X 2)
CULTURE: NO GROWTH
Culture: NO GROWTH

## 2015-06-23 MED FILL — Medication: Qty: 1 | Status: AC

## 2015-06-23 NOTE — Telephone Encounter (Signed)
Unable to reach patient. No answer.  No voicemail.  Attempt to follow up with transitional care management.  Hospital appointment has been cancelled. Will follow as appropriate with rescheduling and call patient 06/24/15.

## 2015-06-24 ENCOUNTER — Other Ambulatory Visit: Payer: Self-pay

## 2015-06-24 ENCOUNTER — Telehealth: Payer: Self-pay

## 2015-06-24 NOTE — Telephone Encounter (Signed)
Information received via fax regarding patients transfer to SNF.  Will continue to follow as appropriate.

## 2015-06-25 ENCOUNTER — Telehealth: Payer: Self-pay | Admitting: Family Medicine

## 2015-06-25 ENCOUNTER — Ambulatory Visit: Payer: Commercial Managed Care - HMO | Admitting: Surgery

## 2015-06-25 NOTE — Telephone Encounter (Signed)
Shawn called from [facility], Pt is being discharged. Dx was Acute Gallstone pancreatitis. Pt is scheduled for 07/11 @ 8:45 am. Thank you!

## 2015-06-25 NOTE — Telephone Encounter (Signed)
Noted.  Will follow as appropriate with transitional care management.

## 2015-06-30 ENCOUNTER — Ambulatory Visit: Payer: Self-pay | Admitting: Family Medicine

## 2015-07-02 ENCOUNTER — Telehealth: Payer: Self-pay

## 2015-07-02 ENCOUNTER — Ambulatory Visit: Payer: Self-pay | Admitting: Surgery

## 2015-07-02 NOTE — Telephone Encounter (Signed)
Transition Care Management Follow-up Telephone Call   Date discharged? 07/02/15  Information received from daughter [name]), HIPPA compliant with verbal.   How have you been since you were released from the hospital? RESTING OK. NO ABD PAIN. NO SOB. NO DIZZINESS. RIGHT LEG WEAKNESS. NO N/V/D. NO FEVER. EATING/DRINKING WITHOUT ISSUES.     Do you understand why you were in the hospital? YES, UTI AND GALLBLADDER PANCREATITIS.   Do you understand the discharge instructions? YES, USE INHALER/NEBULIZER AS NEEDED, HEALTHY FOODS, INCREASE ACTIVITY AS TOLERATED, DRINK PLENTY OF FLUIDS.   Where were you discharged to? HOME.     Items Reviewed:  Medications reviewed: YES, STOPPED TAKING LISINOPRIL, HYDROCHLOROTHIAZIDE.  STARTED TAKING TYLENOL 325mg , AUGMENTIN 875-125mg , COLACE 100mg  without issues.  CONTINUING CHANGED MEDICATION XANAX 0.25mg .  CONTINUING ALL OTHER SCHEDULED MEDICATIONS WHICH HAVE NOT CHANGED, AS DIRECTED. Daughter manages medications.  Allergies reviewed: YES, LATEX.  Dietary changes reviewed: YES, DIABETIC DIET.  Referrals reviewed: YES, APPOINTMENT SCHEDULED WITH PCP. GASTROENTEROLOGY AND DR. PABONE (SURGERY) TO BE SCHEDULED.    Functional Questionnaire:   Activities of Daily Living (ADLs):   He states they are independent in the following: BATHING, DRESSING, TOILETING, SELF FEEDING, GROOMING. States they require assistance with the following: AMBULATING (walker in use/access to wheelchair), meal prep-daughter assists.   Any transportation issues/concerns?: NO. DAUGHTER WILL DRIVE AND ACCOMPANY.    Any patient concerns? YES, NEEDS FL2 FORMS COMPLETED.  AWARE OF TURN AROUND TIME.  NEEDS REFERRAL TO GASTROENTEROLOGY.   Confirmed importance and date/time of follow-up visits scheduled YES, appointment scheduled 07/03/15 at 11:30.  Provider Appointment booked with Dr. Lacinda Axon (PCP).  Confirmed with patient if condition begins to worsen call PCP or go to the ER.  Patient  was given the office number and encouraged to call back with question or concerns.  : YES, VERBALIZED UNDERSTANDING.

## 2015-07-03 ENCOUNTER — Encounter: Payer: Self-pay | Admitting: Family Medicine

## 2015-07-03 ENCOUNTER — Ambulatory Visit (INDEPENDENT_AMBULATORY_CARE_PROVIDER_SITE_OTHER): Payer: Commercial Managed Care - HMO | Admitting: Family Medicine

## 2015-07-03 VITALS — BP 120/68 | HR 61 | Temp 98.0°F | Wt 227.0 lb

## 2015-07-03 DIAGNOSIS — K851 Biliary acute pancreatitis without necrosis or infection: Secondary | ICD-10-CM

## 2015-07-03 DIAGNOSIS — I10 Essential (primary) hypertension: Secondary | ICD-10-CM | POA: Diagnosis not present

## 2015-07-03 NOTE — Patient Instructions (Signed)
Stop the HCTZ.  Continue his other medications.  Follow up in 3 months.  Take care  Dr.Adean Milosevic

## 2015-07-03 NOTE — Assessment & Plan Note (Addendum)
Hospital course reviewed and summarized in history of present illness. Medications were reviewed and reconciled today. Stopping HCTZ. It was unclear to me whether general surgery wanted to see the patient in outpatient setting. I called their office and discuss this with the office staff. There was a consideration for outpatient surgery. As a result, will arrange for patient to follow-up with general surgery. Would need to see cardiology prior to surgical clearance for elective cholecystectomy.  Patient doing well at this time. No evidence of recurrence. No abdominal pain today. Continue current medical therapy.

## 2015-07-03 NOTE — Progress Notes (Addendum)
Subjective:  Patient ID: Chase Simmons, male    DOB: Jul 25, 1933  Age: 82 y.o. MRN: [id number]  CC: Hospital follow up  HPI:  82 year old male with a past medical history hypertension, hyperlipidemia, DM 2, A. fib, COPD, likely dementia presents for hospital follow-up.  Patient was admitted twice in the past month.  Hospitalization was reviewed and is summarized as below:   Patient admitted on 6/1 for acute pancreatitis, suspected to be gallstone pancreatitis. He was treated with conservative care and was discharged to a skilled nursing facility on 6/5.   Patient presented on 6/15 after presenting with confusion and abdominal pain. Was once again found to have pancreatitis. Suspected to be from gallstones. Patient septic on admission and was treated with IV Zosyn. MRCP was negative. Surgery was consulted and did not recommend cholecystectomy (High risk, poor status, etc). Patient was discharged on Augmentin. Patient improved during admission and was discharged to SNF.  Patient resents today for follow-up. He is accompanied by his son. Patient states that he's doing well. He does complain of knee pain. He denies any abdominal pain. Voiding and stooling well. No other complaints this time.  Social Hx   Social History   Social History  . Marital status: Legally Separated    Spouse name: N/A  . Number of children: 3  . Years of education: 12   Occupational History  . Retired Administrator    Social History Main Topics  . Smoking status: Former Smoker    Packs/day: 1.50    Years: 40.00  . Smokeless tobacco: Never Used  . Alcohol use 7.2 oz/week    12 Standard drinks or equivalent per week     Comment: 1-2 drinks daily  . Drug use: No  . Sexual activity: Not Asked   Other Topics Concern  . None   Social History Narrative   Patient grew up in [place], [place]. He is separated from his wife since 68. They never divorced. Patient has 1 son and 2 daughters. He worked as a Dietitian. He completed high school.   Review of Systems  Constitutional: Negative.   Respiratory: Negative.   Gastrointestinal: Negative for abdominal pain.  Musculoskeletal:       Knee pain.   Objective:  BP 120/68   Pulse 61   Temp 98 F (36.7 C)   Wt 227 lb (103 kg)   SpO2 97%   BMI 31.66 kg/m   BP/Weight 08/12/2015 07/28/2015 [id number]  Systolic BP 123XX123 0000000 -  Diastolic BP 73 78 -  Wt. (Lbs) 220 - 227  BMI 30.68 - 31.66   Physical Exam  Constitutional:  Chronically ill-appearing male in no acute distress.  Cardiovascular: Normal rate and regular rhythm.   Not in afib today.  Pulmonary/Chest: Effort normal.  Bibasilar rales.  Abdominal: Soft. He exhibits no distension. There is no tenderness. There is no rebound and no guarding.  Neurological: He is alert.  Psychiatric: He has a normal mood and affect.  Vitals reviewed.  Lab Results  Component Value Date   WBC 8.2 07/27/2015   HGB 12.4 (L) 07/27/2015   HCT 38.6 (L) 07/27/2015   PLT 172 07/27/2015   GLUCOSE 182 (H) 07/27/2015   CHOL 85 06/20/2015   TRIG 102 06/20/2015   HDL 24 (L) 06/20/2015   LDLDIRECT 69.0 04/01/2014   LDLCALC 41 06/20/2015   ALT 18 07/27/2015   AST 23 07/27/2015   NA 139 07/27/2015   K 4.3 07/27/2015  CL 106 07/27/2015   CREATININE 1.10 07/27/2015   BUN 24 (H) 07/27/2015   CO2 25 07/27/2015   INR 1.11 07/27/2015   HGBA1C 7.0 (H) 06/18/2015   Assessment & Plan:   Problem List Items Addressed This Visit    RESOLVED: Acute gallstone pancreatitis [facility] course reviewed and summarized in history of present illness. Medications were reviewed and reconciled today. Stopping HCTZ. It was unclear to me whether general surgery wanted to see the patient in outpatient setting. I called their office and discuss this with the office staff. There was a consideration for outpatient surgery. As a result, will arrange for patient to follow-up with general surgery. Would need to see  cardiology prior to surgical clearance for elective cholecystectomy.  Patient doing well at this time. No evidence of recurrence. No abdominal pain today. Continue current medical therapy.      HTN (hypertension)    BP's well controlled. Stopping HCTZ.       Other Visit Diagnoses   None.    Follow-up: 3 months  [place]

## 2015-07-14 ENCOUNTER — Ambulatory Visit: Payer: Self-pay | Admitting: Family Medicine

## 2015-07-17 ENCOUNTER — Other Ambulatory Visit: Payer: Self-pay | Admitting: Family Medicine

## 2015-07-17 MED ORDER — QUETIAPINE FUMARATE ER 50 MG PO TB24: 50.0000 mg | ORAL_TABLET | Freq: Every day | ORAL | Status: DC

## 2015-07-17 NOTE — Telephone Encounter (Signed)
Notified the patient's son that rx was sent. thanks

## 2015-07-17 NOTE — Telephone Encounter (Signed)
Please advise, thanks.

## 2015-07-17 NOTE — Telephone Encounter (Signed)
Pt son called about pt needing a refill for QUEtiapine (SEROQUEL XR) 50 MG TB24 24 hr tablet.   Pharmacy is [place], [place]  Call son @ [phone]. Thank you!

## 2015-07-27 ENCOUNTER — Emergency Department (HOSPITAL_COMMUNITY)
Admission: EM | Admit: 2015-07-27 | Discharge: 2015-07-28 | Disposition: A | Discharge status: Home / Self Care | Payer: Commercial Managed Care - HMO | Attending: Emergency Medicine | Admitting: Emergency Medicine

## 2015-07-27 ENCOUNTER — Emergency Department (HOSPITAL_COMMUNITY): Payer: Commercial Managed Care - HMO

## 2015-07-27 ENCOUNTER — Encounter (HOSPITAL_COMMUNITY): Payer: Self-pay | Admitting: Emergency Medicine

## 2015-07-27 DIAGNOSIS — Z7984 Long term (current) use of oral hypoglycemic drugs: Secondary | ICD-10-CM | POA: Insufficient documentation

## 2015-07-27 DIAGNOSIS — I10 Essential (primary) hypertension: Secondary | ICD-10-CM | POA: Diagnosis not present

## 2015-07-27 DIAGNOSIS — R42 Dizziness and giddiness: Secondary | ICD-10-CM | POA: Diagnosis present

## 2015-07-27 DIAGNOSIS — E785 Hyperlipidemia, unspecified: Secondary | ICD-10-CM | POA: Diagnosis not present

## 2015-07-27 DIAGNOSIS — Z8551 Personal history of malignant neoplasm of bladder: Secondary | ICD-10-CM | POA: Diagnosis not present

## 2015-07-27 DIAGNOSIS — J449 Chronic obstructive pulmonary disease, unspecified: Secondary | ICD-10-CM | POA: Diagnosis not present

## 2015-07-27 DIAGNOSIS — Z87891 Personal history of nicotine dependence: Secondary | ICD-10-CM | POA: Insufficient documentation

## 2015-07-27 DIAGNOSIS — Z5181 Encounter for therapeutic drug level monitoring: Secondary | ICD-10-CM | POA: Diagnosis not present

## 2015-07-27 DIAGNOSIS — E119 Type 2 diabetes mellitus without complications: Secondary | ICD-10-CM | POA: Diagnosis not present

## 2015-07-27 DIAGNOSIS — I639 Cerebral infarction, unspecified: Secondary | ICD-10-CM

## 2015-07-27 DIAGNOSIS — N390 Urinary tract infection, site not specified: Secondary | ICD-10-CM | POA: Insufficient documentation

## 2015-07-27 DIAGNOSIS — Z79899 Other long term (current) drug therapy: Secondary | ICD-10-CM | POA: Diagnosis not present

## 2015-07-27 LAB — URINALYSIS, ROUTINE W REFLEX MICROSCOPIC
BILIRUBIN URINE: NEGATIVE
GLUCOSE, UA: NEGATIVE mg/dL
HGB URINE DIPSTICK: NEGATIVE
Ketones, ur: NEGATIVE mg/dL
Nitrite: POSITIVE — AB
Protein, ur: NEGATIVE mg/dL
SPECIFIC GRAVITY, URINE: 1.02 (ref 1.005–1.030)
pH: 7 (ref 5.0–8.0)

## 2015-07-27 LAB — COMPREHENSIVE METABOLIC PANEL
ALT: 18 U/L (ref 17–63)
ANION GAP: 8 (ref 5–15)
AST: 23 U/L (ref 15–41)
Albumin: 3.8 g/dL (ref 3.5–5.0)
Alkaline Phosphatase: 74 U/L (ref 38–126)
BILIRUBIN TOTAL: 0.8 mg/dL (ref 0.3–1.2)
BUN: 24 mg/dL — AB (ref 6–20)
CO2: 25 mmol/L (ref 22–32)
Calcium: 9.3 mg/dL (ref 8.9–10.3)
Chloride: 106 mmol/L (ref 101–111)
Creatinine, Ser: 1.1 mg/dL (ref 0.61–1.24)
Glucose, Bld: 182 mg/dL — ABNORMAL HIGH (ref 65–99)
POTASSIUM: 4.3 mmol/L (ref 3.5–5.1)
Sodium: 139 mmol/L (ref 135–145)
TOTAL PROTEIN: 7.3 g/dL (ref 6.5–8.1)

## 2015-07-27 LAB — I-STAT TROPONIN, ED: TROPONIN I, POC: 0.01 ng/mL (ref 0.00–0.08)

## 2015-07-27 LAB — CBC WITH DIFFERENTIAL/PLATELET
Basophils Absolute: 0 10*3/uL (ref 0.0–0.1)
Basophils Relative: 1 %
EOS PCT: 4 %
Eosinophils Absolute: 0.3 10*3/uL (ref 0.0–0.7)
HEMATOCRIT: 38.6 % — AB (ref 39.0–52.0)
Hemoglobin: 12.4 g/dL — ABNORMAL LOW (ref 13.0–17.0)
LYMPHS PCT: 16 %
Lymphs Abs: 1.3 10*3/uL (ref 0.7–4.0)
MCH: 33.4 pg (ref 26.0–34.0)
MCHC: 32.1 g/dL (ref 30.0–36.0)
MCV: 104 fL — AB (ref 78.0–100.0)
MONO ABS: 0.5 10*3/uL (ref 0.1–1.0)
MONOS PCT: 6 %
NEUTROS ABS: 6 10*3/uL (ref 1.7–7.7)
Neutrophils Relative %: 73 %
PLATELETS: 172 10*3/uL (ref 150–400)
RBC: 3.71 MIL/uL — ABNORMAL LOW (ref 4.22–5.81)
RDW: 14 % (ref 11.5–15.5)
WBC: 8.2 10*3/uL (ref 4.0–10.5)

## 2015-07-27 LAB — URINE MICROSCOPIC-ADD ON: RBC / HPF: NONE SEEN RBC/hpf (ref 0–5)

## 2015-07-27 LAB — CBG MONITORING, ED: Glucose-Capillary: 169 mg/dL — ABNORMAL HIGH (ref 65–99)

## 2015-07-27 LAB — PROTIME-INR
INR: 1.11 (ref 0.00–1.49)
PROTHROMBIN TIME: 14.5 s (ref 11.6–15.2)

## 2015-07-27 LAB — LIPASE, BLOOD: LIPASE: 22 U/L (ref 11–51)

## 2015-07-27 MED ORDER — ONDANSETRON HCL 4 MG/2ML IJ SOLN: 4.0000 mg | Freq: Four times a day (QID) | INTRAMUSCULAR | Status: DC | PRN

## 2015-07-27 MED ORDER — CEPHALEXIN 500 MG PO CAPS: 500.0000 mg | ORAL_CAPSULE | Freq: Four times a day (QID) | ORAL | 0 refills | Status: DC

## 2015-07-27 MED ORDER — DIAZEPAM 5 MG/ML IJ SOLN
5.0000 mg | Freq: Once | INTRAMUSCULAR | Status: AC
  Administered 2015-07-27: 5 mg via INTRAVENOUS
  Filled 2015-07-27: qty 2

## 2015-07-27 MED ORDER — DEXTROSE 5 % IV SOLN
1.0000 g | Freq: Once | INTRAVENOUS | Status: AC
  Administered 2015-07-27: 1 g via INTRAVENOUS
  Filled 2015-07-27: qty 10

## 2015-07-27 MED ORDER — MECLIZINE HCL 25 MG PO TABS: 25.0000 mg | ORAL_TABLET | Freq: Three times a day (TID) | ORAL | 0 refills | Status: DC | PRN

## 2015-07-27 NOTE — ED Triage Notes (Signed)
Pt BIB  EMS after calling out for dizziness, nausea and diaphoresis. Reports a 'neurological disorder'. Alert and oriented. DM. CBG 164.

## 2015-07-27 NOTE — ED Notes (Signed)
Patient refused MRI, notified MD.

## 2015-07-27 NOTE — ED Notes (Signed)
Patient transported to X-ray 

## 2015-07-27 NOTE — ED Notes (Signed)
Pt reminded of the need for a urine sample.  

## 2015-07-27 NOTE — ED Notes (Signed)
PT aware of urine sample 

## 2015-07-27 NOTE — ED Notes (Signed)
Daughter's contact information:  [address] [phone]

## 2015-07-27 NOTE — ED Provider Notes (Signed)
[facility] DEPT Provider Note   CSN: [id number] Arrival date & time: 07/27/15  1653  First Provider Contact:  First MD Initiated Contact with Patient 07/27/15 1704        History   Chief Complaint Chief Complaint  Patient presents with  . Dizziness  . Nausea    HPI Chase Simmons is a 82 y.o. male.  82 year old male with history of COPD, diabetes mellitus, hypertension, hyperlipidemia presents for nausea and vomiting. The patient says that at an unknown known time he sat down in his recliner today. He said he doesn't remember falling asleep but then was suddenly awoken feeling extremely sick. He said he felt extremely nauseous and started vomiting. He also noted that he needed to urinate. He was able to get himself to his wheelchair and make it to the restroom to urinate but says that he could not find phone numbers in his house or figure out how to call his children which is very abnormal for him. He said he could not read things normally and felt extremely disoriented. Per EMS the patient was still nauseous en route. The patient denies any abdominal pain. He said he does think he felt somewhat dizzy. He said he still does not feel completely back to his baseline and still feels neurologically things are not as clear as they should be. He denies any dizziness currently. Denies chest pain, shortness of breath. No focal weakness in his arms or legs. He notes that he does have a tremor at baseline and has not been able to lift his right arm for significant period of time secondary to an injury in his shoulder.    Dizziness   Associated symptoms include nausea and vomiting. Pertinent negatives include no numbness and no weakness.    Past Medical History:  Diagnosis Date  . Allergy   . Arrhythmia   . Cancer [facility]) pt unsure of year   bladder cancer-Dr. Jacqlyn Larsen  . Conversion disorder with seizures or convulsions   . COPD (chronic obstructive pulmonary disease) ([place])   . Diabetes  mellitus without complication ([place])   . Dropped heart beats 07/25/2011  . Hyperlipidemia   . Hypertension   . Tremor, essential    sees [facility] neurologist  . Urinary incontinence   . UTI (lower urinary tract infection)   . Vitamin D deficiency     Patient Active Problem List   Diagnosis Date Noted  . Acute gallstone pancreatitis 06/19/2015  . Anxiety 06/17/2015  . Malignant neoplasm of urinary bladder ([place]) 06/17/2015  . Allergic rhinitis 05/14/2015  . Non compliance with medical treatment 03/21/2015  . Knee pain, bilateral 03/16/2015  . Anxiety state 04/05/2014  . Atrial fibrillation ([place]) 08/30/2013  . Acid reflux 08/30/2013  . Tremor, essential 10/22/2012  . HLD (hyperlipidemia) 10/22/2012  . HTN (hypertension) 08/01/2012  . DM (diabetes mellitus), type 2 ([place]) 08/01/2012  . Benign prostatic hyperplasia with urinary obstruction 09/02/2011  . Vitamin D deficiency 08/10/2011    Past Surgical History:  Procedure Laterality Date  . bladder cancer    . CATARACT EXTRACTION    . HERNIA REPAIR         Home Medications    Prior to Admission medications   Medication Sig Start Date End Date Taking? Authorizing Provider  acetaminophen (TYLENOL) 325 MG tablet Take 2 tablets (650 mg total) by mouth every 6 (six) hours as needed for mild pain (or Fever >/= 101). 06/22/15  Yes Nicholes Mango, MD  ALPRAZolam Duanne Moron) 0.25 MG  tablet take 1 tablet by mouth twice a day if needed for anxiety 06/22/15  Yes Nicholes Mango, MD  atorvastatin (LIPITOR) 80 MG tablet TAKE 1 TABLET ONE TIME DAILY 04/13/15  Yes Rubbie Battiest, NP  carvedilol (COREG) 12.5 MG tablet TAKE 1 TABLET  2 (TWO) TIMES DAILY WITH A MEAL. 04/09/15  Yes Rubbie Battiest, NP  Cholecalciferol (VITAMIN D-1000 MAX ST) 1000 units tablet Take 1,000 mg by mouth daily.    Yes Historical Provider, MD  docusate sodium (COLACE) 100 MG capsule Take 1 capsule (100 mg total) by mouth 2 (two) times daily. 06/22/15  Yes Nicholes Mango, MD  fexofenadine  (ALLEGRA) 180 MG tablet Take 180 mg by mouth daily as needed for allergies or rhinitis.   Yes Historical Provider, MD  glimepiride (AMARYL) 2 MG tablet Take 2 mg by mouth 2 (two) times daily.   Yes Historical Provider, MD  metFORMIN (GLUCOPHAGE) 1000 MG tablet TAKE 1 TABLET TWICE DAILY  WITH  FOOD 04/08/15  Yes Rubbie Battiest, NP  ondansetron (ZOFRAN) 4 MG tablet Take 1 tablet (4 mg total) by mouth every 8 (eight) hours as needed for nausea or vomiting. 04/20/15  Yes Daymon Larsen, MD  PARoxetine (PAXIL) 40 MG tablet Take 1 tablet (40 mg total) by mouth daily. 01/08/15  Yes Rubbie Battiest, NP  primidone (MYSOLINE) 50 MG tablet TAKE 1 TABLET TWICE DAILY 04/09/15  Yes Rubbie Battiest, NP  tiotropium (SPIRIVA) 18 MCG inhalation capsule Place 1 capsule (18 mcg total) into inhaler and inhale daily. 01/08/15  Yes Rubbie Battiest, NP  cephALEXin (KEFLEX) 500 MG capsule Take 1 capsule (500 mg total) by mouth 4 (four) times daily. 07/27/15   Harvel Quale, MD  glimepiride (AMARYL) 4 MG tablet take 1 tablet by mouth WITH BREAKFAST. Patient not taking: Reported on 07/27/2015 06/10/15   Jackolyn Confer, MD  ipratropium-albuterol (DUONEB) 0.5-2.5 (3) MG/3ML SOLN Take 3 mLs by nebulization every 6 (six) hours as needed. Patient not taking: Reported on 07/27/2015 06/08/15   Dustin Flock, MD  meclizine (ANTIVERT) 25 MG tablet Take 1 tablet (25 mg total) by mouth 3 (three) times daily as needed for dizziness or nausea. 07/27/15   Harvel Quale, MD  QUEtiapine (SEROQUEL XR) 50 MG TB24 24 hr tablet Take 1 tablet (50 mg total) by mouth at bedtime. Patient not taking: Reported on 07/27/2015 07/17/15   Coral Spikes, DO    Family History Family History  Problem Relation Age of Onset  . Arthritis Mother   . Dementia Mother   . Cancer Maternal Aunt     breast cancer  . Cancer Maternal Uncle     prostate    Social History Social History  Substance Use Topics  . Smoking status: Former Smoker    Packs/day: 1.50    Years:  40.00  . Smokeless tobacco: Never Used  . Alcohol use 7.2 oz/week    12 Standard drinks or equivalent per week     Comment: 1-2 drinks daily     Allergies   Latex   Review of Systems Review of Systems  Constitutional: Negative for appetite change, chills, diaphoresis, fatigue and fever.  HENT: Negative for congestion and sinus pressure.   Eyes: Positive for visual disturbance. Negative for pain.  Respiratory: Negative for cough, chest tightness, shortness of breath and wheezing.   Cardiovascular: Negative for chest pain and palpitations.  Gastrointestinal: Positive for nausea and vomiting. Negative for abdominal pain, constipation and diarrhea.  Genitourinary: Negative for dysuria, frequency and urgency.  Musculoskeletal: Negative for back pain, myalgias, neck pain and neck stiffness.  Skin: Negative for rash.  Neurological: Positive for dizziness. Negative for seizures, syncope, speech difficulty, weakness, light-headedness, numbness and headaches.  Hematological: Does not bruise/bleed easily.     Physical Exam Updated Vital Signs BP 130/89   Pulse 87   Temp 98.7 F (37.1 C) (Oral)   Resp 23   SpO2 99%   Physical Exam  Constitutional: He is oriented to person, place, and time. No distress.  HENT:  Head: Normocephalic and atraumatic.  Right Ear: Tympanic membrane and external ear normal.  Left Ear: Tympanic membrane and external ear normal.  Nose: Nose normal.  Mouth/Throat: Oropharynx is clear and moist.  Eyes: Pupils are equal, round, and reactive to light. Right eye exhibits no discharge. Left eye exhibits no discharge. Right eye exhibits nystagmus. Left eye exhibits nystagmus.  Neck: Normal range of motion. Neck supple.  Cardiovascular: Normal rate, regular rhythm, normal heart sounds and intact distal pulses.   No murmur heard. Pulmonary/Chest: Effort normal and breath sounds normal. No respiratory distress. He has no wheezes. He has no rales.  Abdominal: Soft.  Bowel sounds are normal. He exhibits no distension. There is no tenderness. There is no guarding.  Musculoskeletal: Normal range of motion. He exhibits no edema, tenderness or deformity.  Neurological: He is alert and oriented to person, place, and time. No cranial nerve deficit or sensory deficit. He exhibits normal muscle tone. Coordination normal.  Normal finger to nose examination bilaterally  Skin: Skin is warm and dry. Capillary refill takes less than 2 seconds. He is not diaphoretic.  Vitals reviewed.    ED Treatments / Results  Labs (all labs ordered are listed, but only abnormal results are displayed) Labs Reviewed  URINALYSIS, ROUTINE W REFLEX MICROSCOPIC (NOT AT [facility]) - Abnormal; Notable for the following:       Result Value   APPearance CLOUDY (*)    Nitrite POSITIVE (*)    Leukocytes, UA LARGE (*)    All other components within normal limits  CBC WITH DIFFERENTIAL/PLATELET - Abnormal; Notable for the following:    RBC 3.71 (*)    Hemoglobin 12.4 (*)    HCT 38.6 (*)    MCV 104.0 (*)    All other components within normal limits  COMPREHENSIVE METABOLIC PANEL - Abnormal; Notable for the following:    Glucose, Bld 182 (*)    BUN 24 (*)    All other components within normal limits  URINE MICROSCOPIC-ADD ON - Abnormal; Notable for the following:    Squamous Epithelial / LPF 0-5 (*)    Bacteria, UA MANY (*)    All other components within normal limits  CBG MONITORING, ED - Abnormal; Notable for the following:    Glucose-Capillary 169 (*)    All other components within normal limits  URINE CULTURE  LIPASE, BLOOD  PROTIME-INR  I-STAT TROPOININ, ED    EKG  EKG Interpretation  Date/Time:  Monday July 27 2015 17:19:59 EDT Ventricular Rate:  60 PR Interval:    QRS Duration: 137 QT Interval:  466 QTC Calculation: 466 R Axis:   -40 Text Interpretation:  Sinus rhythm Prolonged PR interval Nonspecific IVCD with LAD Probable anteroseptal infarct, old Borderline T  abnormalities, inferior leads Decreased artifact compared to previous tracing.  No ischemic changes present. Confirmed by Lonia Skinner ([id number]) on 07/27/2015 5:29:39 PM       Radiology Ct Head Wo Contrast  Result Date: 07/27/2015 CLINICAL DATA:  Nausea and vomiting today.  No known injury. EXAM: CT HEAD WITHOUT CONTRAST TECHNIQUE: Contiguous axial images were obtained from the base of the skull through the vertex without intravenous contrast. COMPARISON:  None. FINDINGS: Brain: Advanced atrophy with sulcal prominence and centralized volume loss with commensurate ex vacuo dilatation the ventricular system. Extensive nearly confluent periventricular hypodensities compatible microvascular ischemic disease. The gray-white differentiation is otherwise well maintained without CT evidence of acute large territory infarct. No intraparenchymal or extra-axial mass or hemorrhage. Normal configuration of the ventricles and basilar cisterns. No midline shift. Vascular: No hyperdense vessel or unexpected calcification. Skull: No displaced calvarial fracture Sinuses/Orbits: Limited visualization the paranasal sinuses and mastoid air cells is normal. No air-fluid levels. Post left-sided cataract surgery. Other: Regional soft tissues appear normal. IMPRESSION: Advanced atrophy and microvascular ischemic disease without acute intracranial process. Electronically Signed   By: Sandi Mariscal M.D.   On: 07/27/2015 18:27  Dg Abd Acute W/chest  Result Date: 07/27/2015 CLINICAL DATA:  Dizziness and vomiting EXAM: DG ABDOMEN ACUTE W/ 1V CHEST COMPARISON:  None. FINDINGS: Cardiac shadow is enlarged. The lungs are well aerated bilaterally. No focal infiltrate or sizable effusion is seen. No findings to suggest free air are noted. Scattered large and small bowel gas is seen. Diffuse vascular calcifications are noted. No acute bony abnormality is seen. IMPRESSION: No acute abnormality noted. Electronically Signed   By: Inez Catalina M.D.    On: 07/27/2015 17:59   Procedures Procedures (including critical care time)  Medications Ordered in ED Medications  ondansetron (ZOFRAN) injection 4 mg (not administered)  diazepam (VALIUM) injection 5 mg (5 mg Intravenous Given 07/27/15 2014)  cefTRIAXone (ROCEPHIN) 1 g in dextrose 5 % 50 mL IVPB (0 g Intravenous Stopped 07/28/15 0043)     Initial Impression / Assessment and Plan / ED Course  I have reviewed the triage vital signs and the nursing notes.  Pertinent labs & imaging results that were available during my care of the patient were reviewed by me and considered in my medical decision making (see chart for details).  Clinical Course    Patient was seen and evaluated in stable condition. Patient reporting history of sudden onset of dizziness with vomiting. Laboratory studies were unremarkable other than a UA consistent with infection. EKG unremarkable for patient. Acute abdominal series unremarkable and without sign of obstruction. CT head unremarkable. Attempted to obtain MRI but despite being given Valium the patient continued to refuse. He said that it was nothing more than a torture device to sit inside the tube. Discussed with him that we would not be able to rule out a posterior stroke at this time without the MRI but he said he was just not able to do it. The patient's symptoms resolved while in the emergency department and he was able to ambulate but he said was his baseline with no signs of ataxia. Case was discussed with Dr. Nicole Kindred from neurology who agreed at this time if patient unable to get MRI then he can be discharged to follow-up outpatient. Patient was given a dose of Rocephin for his urinary tract infection and discharged with prescription for Keflex. He was discharged home in stable condition with instruction to follow-up with his primary care physician. Patient expressed understanding and agreement with plan of care.  Final Clinical Impressions(s) / ED Diagnoses     Final diagnoses:  UTI (lower urinary tract infection)  Dizziness    New Prescriptions New Prescriptions  CEPHALEXIN (KEFLEX) 500 MG CAPSULE    Take 1 capsule (500 mg total) by mouth 4 (four) times daily.     Harvel Quale, MD 07/28/15 [phone]

## 2015-07-27 NOTE — ED Notes (Signed)
Pt unable to give urine sample at this time.  Will try again.

## 2015-07-27 NOTE — ED Notes (Signed)
MD at bedside. 

## 2015-07-27 NOTE — Discharge Instructions (Signed)
You were seen and evaluated today for your acute onset of nausea, vomiting, dizziness. Please use the medication provided for dizziness. Your urine appeared consistent with infection and so please complete the antibiotics prescribed. We had wanted to complete an MRI to rule out a small stroke. I discussed the case with the neurologist who said if we can't obtain the MRI that the best treatment is to treat you for your dizziness and allow you to go home as you're able to ambulate without difficulty and your symptoms resolved. Please follow-up with her primary care physician for reevaluation.

## 2015-07-27 NOTE — ED Notes (Signed)
Patient transported to MRI 

## 2015-07-27 NOTE — ED Notes (Signed)
Bed: WA02 Expected date:  Expected time:  Means of arrival:  Comments: Hold ems 82 yo n/v

## 2015-07-27 NOTE — ED Notes (Signed)
Consumed sandwich and oral fluids without difficulty. Attempted ambulation in room, gait slow and unsteady c/o that knee does not work as well as it did 6 months ago.

## 2015-07-28 NOTE — ED Notes (Signed)
Left message for daughter to call regarding patient.

## 2015-07-28 NOTE — ED Notes (Signed)
Attempted to call daughter [name] at [phone] no answer left message.

## 2015-07-30 LAB — URINE CULTURE

## 2015-07-31 ENCOUNTER — Telehealth (HOSPITAL_BASED_OUTPATIENT_CLINIC_OR_DEPARTMENT_OTHER): Payer: Self-pay | Admitting: *Deleted

## 2015-07-31 NOTE — Telephone Encounter (Signed)
Positive urine culture treated with  Cefazolin no change needed per Milus Glazier PharmD.

## 2015-08-03 ENCOUNTER — Ambulatory Visit: Payer: Self-pay | Admitting: Family Medicine

## 2015-08-06 ENCOUNTER — Ambulatory Visit: Payer: Self-pay | Admitting: Family Medicine

## 2015-08-12 ENCOUNTER — Encounter: Payer: Self-pay | Admitting: Family Medicine

## 2015-08-12 ENCOUNTER — Encounter (INDEPENDENT_AMBULATORY_CARE_PROVIDER_SITE_OTHER): Payer: Self-pay

## 2015-08-12 ENCOUNTER — Ambulatory Visit (INDEPENDENT_AMBULATORY_CARE_PROVIDER_SITE_OTHER): Payer: Commercial Managed Care - HMO | Admitting: Family Medicine

## 2015-08-12 DIAGNOSIS — N3000 Acute cystitis without hematuria: Secondary | ICD-10-CM | POA: Diagnosis not present

## 2015-08-12 DIAGNOSIS — N390 Urinary tract infection, site not specified: Secondary | ICD-10-CM | POA: Insufficient documentation

## 2015-08-12 NOTE — Progress Notes (Signed)
Pre visit review using our clinic review tool, if applicable. No additional management support is needed unless otherwise documented below in the visit note. 

## 2015-08-12 NOTE — Progress Notes (Signed)
Subjective:  Patient ID: Chase Simmons, male    DOB: 1933/09/06  Age: 82 y.o. MRN: [id number]  CC: ED follow up  HPI:  82 year old male with a history of DM 2, hypertension, hyperlipidemia, noncompliance, atrial fibrillation presents for ED follow-up.  Patient was seen in the ED on 7/24. He presented with nausea and vomiting. He also had change in his mental status. CT head negative. Urinalysis is obtained despite lack of reported urinary symptoms. Urinalysis revealed positive nitrites and large leukocytes. He was treated empirically for UTI. He was discharged home on Keflex. Neurology saw the patient and further workup was not obtained.  Patient resents today for follow-up. Patient has no complaints at this time. No abdominal pain. No reports of urinary complaints. Patient states that he's feeling well.   Social Hx   Social History   Social History  . Marital status: Legally Separated    Spouse name: N/A  . Number of children: 3  . Years of education: 12   Occupational History  . Retired Administrator    Social History Main Topics  . Smoking status: Former Smoker    Packs/day: 1.50    Years: 40.00  . Smokeless tobacco: Never Used  . Alcohol use 7.2 oz/week    12 Standard drinks or equivalent per week     Comment: 1-2 drinks daily  . Drug use: No  . Sexual activity: Not Asked   Other Topics Concern  . None   Social History Narrative   Patient grew up in [place], [place]. He is separated from his wife since 25. They never divorced. Patient has 1 son and 2 daughters. 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   Review of Systems  Constitutional: Negative.   Gastrointestinal: Negative.    Objective:  BP 122/73 (BP Location: Left Arm, Patient Position: Sitting, Cuff Size: Normal)   Pulse 92   Temp 97.7 F (36.5 C) (Oral)   Wt 220 lb (99.8 kg)   SpO2 97%   BMI 30.68 kg/m   BP/Weight 08/12/2015 07/28/2015 [id number]  Systolic BP 123XX123 0000000 -  Diastolic BP 73 78  -  Wt. (Lbs) 220 - 227  BMI 30.68 - 31.66   Physical Exam  Constitutional: He appears well-developed. No distress.  Cardiovascular: Normal rate and regular rhythm.   Pulmonary/Chest: Effort normal. He has no wheezes. He has no rales.  Abdominal: Soft. He exhibits no distension. There is no tenderness. There is no rebound and no guarding.  Neurological: He is alert.  Psychiatric:  Flat affect.   Vitals reviewed.  Lab Results  Component Value Date   WBC 8.2 07/27/2015   HGB 12.4 (L) 07/27/2015   HCT 38.6 (L) 07/27/2015   PLT 172 07/27/2015   GLUCOSE 182 (H) 07/27/2015   CHOL 85 06/20/2015   TRIG 102 06/20/2015   HDL 24 (L) 06/20/2015   LDLDIRECT 69.0 04/01/2014   LDLCALC 41 06/20/2015   ALT 18 07/27/2015   AST 23 07/27/2015   NA 139 07/27/2015   K 4.3 07/27/2015   CL 106 07/27/2015   CREATININE 1.10 07/27/2015   BUN 24 (H) 07/27/2015   CO2 25 07/27/2015   INR 1.11 07/27/2015   HGBA1C 7.0 (H) 06/18/2015    Assessment & Plan:   Problem List Items Addressed This Visit    UTI (urinary tract infection)    New problem. Patient recently seen in the ED. Patient was diagnosed with urinary tract infection despite symptoms of UTI.  Urine culture revealed pansensitive Escherichia coli. Patient was treated with Keflex. Has finished course. Patient stable at this time. No complaints.       Other Visit Diagnoses   None.    Follow-up: Return in about 3 months (around 11/12/2015) for Please see if we can get someone to come with him .  [place]

## 2015-08-12 NOTE — Assessment & Plan Note (Addendum)
New problem. Patient recently seen in the ED. Patient was diagnosed with urinary tract infection despite symptoms of UTI. Urine culture revealed pansensitive Escherichia coli. Patient was treated with Keflex. Has finished course. Patient stable at this time. No complaints.

## 2015-08-12 NOTE — Patient Instructions (Signed)
You're doing fine.  Follow up in 3 months or sooner if needed.  Take care  Dr. Lacinda Axon   **Please make sure a relative or caregiver comes with you.

## 2015-08-13 NOTE — Assessment & Plan Note (Signed)
BP's well controlled. Stopping HCTZ.

## 2015-10-05 ENCOUNTER — Ambulatory Visit (INDEPENDENT_AMBULATORY_CARE_PROVIDER_SITE_OTHER): Payer: Commercial Managed Care - HMO | Admitting: Family Medicine

## 2015-10-05 ENCOUNTER — Encounter: Payer: Self-pay | Admitting: Family Medicine

## 2015-10-05 DIAGNOSIS — Z23 Encounter for immunization: Secondary | ICD-10-CM | POA: Diagnosis not present

## 2015-10-05 DIAGNOSIS — R319 Hematuria, unspecified: Secondary | ICD-10-CM | POA: Insufficient documentation

## 2015-10-05 DIAGNOSIS — I10 Essential (primary) hypertension: Secondary | ICD-10-CM

## 2015-10-05 DIAGNOSIS — I480 Paroxysmal atrial fibrillation: Secondary | ICD-10-CM | POA: Diagnosis not present

## 2015-10-05 DIAGNOSIS — E118 Type 2 diabetes mellitus with unspecified complications: Secondary | ICD-10-CM

## 2015-10-05 DIAGNOSIS — E785 Hyperlipidemia, unspecified: Secondary | ICD-10-CM

## 2015-10-05 LAB — POCT URINALYSIS DIPSTICK
BILIRUBIN UA: NEGATIVE
GLUCOSE UA: 100
KETONES UA: NEGATIVE
Nitrite, UA: NEGATIVE
PH UA: 5
Protein, UA: NEGATIVE
RBC UA: NEGATIVE
Spec Grav, UA: 1.01
Urobilinogen, UA: 0.2

## 2015-10-05 MED ORDER — ALPRAZOLAM 0.25 MG PO TABS: ORAL_TABLET | ORAL | 0 refills | Status: DC

## 2015-10-05 MED ORDER — CARVEDILOL 6.25 MG PO TABS: 6.2500 mg | ORAL_TABLET | Freq: Two times a day (BID) | ORAL | 3 refills | Status: DC

## 2015-10-05 NOTE — Progress Notes (Signed)
Pre visit review using our clinic review tool, if applicable. No additional management support is needed unless otherwise documented below in the visit note. 

## 2015-10-05 NOTE — Progress Notes (Signed)
Subjective:  Patient ID: Chase Simmons, male    DOB: 30-Jul-1933  Age: 82 y.o. MRN: [id number]  CC: Follow up, Hematuria  HPI:  82 year old male with a complicated past medical history including atrial fibrillation, DM 2, hypertension, hyperlipidemia, BPH presents for follow-up. Patient complains of recent hematuria.  HTN  Stable on carvedilol.  A fib  Stable on Coreg.  Not on anticoagulation secondary to noncompliance.  Patient noted to be bradycardic today.  DM-2  Stable.  Most recent A1c was 7.0.  On Amaryl.  HLD  Well controlled on Lipitor.  Hematuria  Patient reports that he has recently noticed dark red urine. He is not sure of how many times or when this started. He is a poor historian.  He reports difficulty with urination secondary to BPH.  He states that he has an upcoming appointment with his urologist.  Social Hx   Social History   Social History  . Marital status: Legally Separated    Spouse name: N/A  . Number of children: 3  . Years of education: 12   Occupational History  . Retired Administrator    Social History Main Topics  . Smoking status: Former Smoker    Packs/day: 1.50    Years: 40.00  . Smokeless tobacco: Never Used  . Alcohol use 7.2 oz/week    12 Standard drinks or equivalent per week     Comment: 1-2 drinks daily  . Drug use: No  . Sexual activity: Not Asked   Other Topics Concern  . None   Social History Narrative   Patient grew up in [place], [place]. He is separated from his wife since 48. They never divorced. Patient has 1 son and 2 daughters. 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   Review of Systems  Constitutional: Negative.   Genitourinary: Positive for difficulty urinating and hematuria.   Objective:  BP 120/64 (BP Location: Right Arm, Patient Position: Sitting, Cuff Size: Normal)   Pulse (!) 42   Temp 98 F (36.7 C) (Oral)   Wt 222 lb 2 oz (100.8 kg)   SpO2 92%   BMI 30.98 kg/m    BP/Weight 10/05/2015 08/12/2015 [id number]  Systolic BP 123456 [id number] 0000000  Diastolic BP 64 73 78  Wt. (Lbs) 222.13 220 -  BMI 30.98 30.68 -   Physical Exam  Constitutional:  Chronically ill-appearing male in no acute distress.  Cardiovascular:  Bradycardic.  Pulmonary/Chest: Effort normal. He has no wheezes. He has no rales.  Abdominal: Soft. He exhibits no distension. There is no tenderness.  Neurological: He is alert.  Psychiatric:  Flat affect.  Vitals reviewed.   Lab Results  Component Value Date   WBC 8.2 07/27/2015   HGB 12.4 (L) 07/27/2015   HCT 38.6 (L) 07/27/2015   PLT 172 07/27/2015   GLUCOSE 182 (H) 07/27/2015   CHOL 85 06/20/2015   TRIG 102 06/20/2015   HDL 24 (L) 06/20/2015   LDLDIRECT 69.0 04/01/2014   LDLCALC 41 06/20/2015   ALT 18 07/27/2015   AST 23 07/27/2015   NA 139 07/27/2015   K 4.3 07/27/2015   CL 106 07/27/2015   CREATININE 1.10 07/27/2015   BUN 24 (H) 07/27/2015   CO2 25 07/27/2015   INR 1.11 07/27/2015   HGBA1C 7.0 (H) 06/18/2015    Assessment & Plan:   Problem List Items Addressed This Visit    Atrial fibrillation ([place])    In sinus rhythm today. Decreasing Coreg due to  bradycardia.      Relevant Medications   carvedilol (COREG) 6.25 MG tablet   DM (diabetes mellitus), type 2 ([place])    Most recent A1C was 7.0 (June). Continuing Amaryl.      Hematuria    New complaint. UA negative today. Has follow up with Urology.      Relevant Orders   POCT Urinalysis Dipstick (Completed)   HLD (hyperlipidemia)    Stable. Continue Lipitor.      Relevant Medications   carvedilol (COREG) 6.25 MG tablet   HTN (hypertension)    Stable. Decreasing Coreg due to bradycardia.      Relevant Medications   carvedilol (COREG) 6.25 MG tablet    Other Visit Diagnoses    Encounter for immunization       Relevant Orders   Flu vaccine HIGH DOSE PF (Completed)      Meds ordered this encounter  Medications  . carvedilol (COREG) 6.25 MG tablet     Sig: Take 1 tablet (6.25 mg total) by mouth 2 (two) times daily.    Dispense:  180 tablet    Refill:  3  . ALPRAZolam (XANAX) 0.25 MG tablet    Sig: take 1 tablet by mouth twice a day if needed for anxiety    Dispense:  30 tablet    Refill:  0    Follow-up: Return in about 3 months (around 01/05/2016).  [place]

## 2015-10-05 NOTE — Assessment & Plan Note (Signed)
In sinus rhythm today. Decreasing Coreg due to bradycardia.

## 2015-10-05 NOTE — Assessment & Plan Note (Signed)
Most recent A1C was 7.0 (June). Continuing Amaryl.

## 2015-10-05 NOTE — Patient Instructions (Signed)
I have decreased the Coreg to 6.25 mg twice daily.  Continue your other medications.  Follow up in 3 months.  Take care  Dr. Lacinda Axon

## 2015-10-05 NOTE — Assessment & Plan Note (Signed)
Stable. Continue Lipitor. 

## 2015-10-05 NOTE — Assessment & Plan Note (Signed)
New complaint. UA negative today. Has follow up with Urology.

## 2015-10-05 NOTE — Assessment & Plan Note (Signed)
Stable. Decreasing Coreg due to bradycardia.

## 2015-10-15 ENCOUNTER — Other Ambulatory Visit: Payer: Self-pay | Admitting: Nurse Practitioner

## 2015-10-15 NOTE — Telephone Encounter (Signed)
30 tablets sent in 10/15/15. Last seen 10/05/15.

## 2015-10-16 NOTE — Telephone Encounter (Signed)
faxed

## 2015-11-04 ENCOUNTER — Other Ambulatory Visit: Payer: Self-pay | Admitting: *Deleted

## 2015-11-04 MED ORDER — ALPRAZOLAM 0.25 MG PO TABS: ORAL_TABLET | ORAL | 1 refills | Status: DC

## 2015-11-04 MED ORDER — ATORVASTATIN CALCIUM 80 MG PO TABS: ORAL_TABLET | ORAL | 3 refills | Status: DC

## 2015-11-04 NOTE — Telephone Encounter (Signed)
Pt called back. This needs to go to  [facility] on [address], [place].

## 2015-11-04 NOTE — Telephone Encounter (Signed)
Faxed

## 2015-11-04 NOTE — Telephone Encounter (Signed)
Patient will need a medication refill for alprazolam and atorvastatin -90 day supply  Pharmacy [facility]

## 2015-11-04 NOTE — Telephone Encounter (Signed)
Refilled 10/15/15. Pt last seen 10/05/15. Please advise?

## 2016-01-05 ENCOUNTER — Telehealth: Payer: Self-pay | Admitting: Family Medicine

## 2016-01-05 ENCOUNTER — Encounter: Payer: Self-pay | Admitting: Family Medicine

## 2016-01-05 ENCOUNTER — Ambulatory Visit (INDEPENDENT_AMBULATORY_CARE_PROVIDER_SITE_OTHER): Payer: Medicare HMO | Admitting: Family Medicine

## 2016-01-05 VITALS — BP 155/78 | HR 40 | Temp 97.7°F | Resp 14 | Wt 231.8 lb

## 2016-01-05 DIAGNOSIS — D649 Anemia, unspecified: Secondary | ICD-10-CM

## 2016-01-05 DIAGNOSIS — I700 Atherosclerosis of aorta: Secondary | ICD-10-CM | POA: Insufficient documentation

## 2016-01-05 DIAGNOSIS — E785 Hyperlipidemia, unspecified: Secondary | ICD-10-CM | POA: Diagnosis not present

## 2016-01-05 DIAGNOSIS — R001 Bradycardia, unspecified: Secondary | ICD-10-CM | POA: Diagnosis not present

## 2016-01-05 DIAGNOSIS — I2510 Atherosclerotic heart disease of native coronary artery without angina pectoris: Secondary | ICD-10-CM | POA: Insufficient documentation

## 2016-01-05 DIAGNOSIS — E118 Type 2 diabetes mellitus with unspecified complications: Secondary | ICD-10-CM

## 2016-01-05 HISTORY — DX: Atherosclerotic heart disease of native coronary artery without angina pectoris: I25.10

## 2016-01-05 MED ORDER — PAROXETINE HCL 40 MG PO TABS: 40.0000 mg | ORAL_TABLET | Freq: Every day | ORAL | 1 refills | Status: DC

## 2016-01-05 MED ORDER — GLIMEPIRIDE 4 MG PO TABS: ORAL_TABLET | ORAL | 0 refills | Status: DC

## 2016-01-05 MED ORDER — ALPRAZOLAM 0.25 MG PO TABS: ORAL_TABLET | ORAL | 1 refills | Status: DC

## 2016-01-05 NOTE — Assessment & Plan Note (Signed)
Has been stable but patient has stopped Amaryl. Advised compliance with metformin and Amaryl. A1c today.

## 2016-01-05 NOTE — Telephone Encounter (Signed)
Refill sent.

## 2016-01-05 NOTE — Patient Instructions (Addendum)
Continue current meds.  We will call regarding the low heart rate.  Follow up in 3 months.  Take care  Dr. Lacinda Axon

## 2016-01-05 NOTE — Assessment & Plan Note (Signed)
Stable. Lipid panel today. Continue Lipitor. 

## 2016-01-05 NOTE — Progress Notes (Signed)
Subjective:  Patient ID: Chase Simmons, male    DOB: 03-07-1933  Age: 83 y.o. MRN: [id number]  CC: Follow up  HPI:  83 year old male with hypertension, hyperlipidemia, paroxysmal A. Fib, DM 2 presents for follow up.  DM-2  Has been well controlled. Last A1C was 7.0.  Needs A1c today.  He is not taking his Amaryl and I'm unsure why.  Is taking metformin.  Hyperlipidemia  Most recent lipid panel was well controlled.  Repeat lipid panel today.  Is currently on max dose lipitor.  Bradycardia  Noted at last visit.  Coreg was decreased at that time.  Patient is currently asymptomatic but remains bradycardic.  Social Hx   Social History   Social History  . Marital status: Legally Separated    Spouse name: N/A  . Number of children: 3  . Years of education: 12   Occupational History  . Retired Administrator    Social History Main Topics  . Smoking status: Former Smoker    Packs/day: 1.50    Years: 40.00  . Smokeless tobacco: Never Used  . Alcohol use 7.2 oz/week    12 Standard drinks or equivalent per week     Comment: 1-2 drinks daily  . Drug use: No  . Sexual activity: Not Asked   Other Topics Concern  . None   Social History Narrative   Patient grew up in [place], [place]. He is separated from his wife since 54. They never divorced. Patient has 1 son and 2 daughters. 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   Review of Systems  Cardiovascular: Negative.   Musculoskeletal: Positive for gait problem.   Objective:  BP (!) 155/78   Pulse (!) 40   Temp 97.7 F (36.5 C) (Oral)   Resp 14   Wt 231 lb 12.8 oz (105.1 kg)   SpO2 98%   BMI 32.33 kg/m   BP/Weight 01/05/2016 [id number]  Systolic BP 99991111 [id number]  Diastolic BP 78 64 73  Wt. (Lbs) 231.8 222.13 220  BMI 32.33 30.98 30.68   Physical Exam  Constitutional: He appears well-developed. No distress.  Cardiovascular:  Bradycardic. Regular.  Pulmonary/Chest: Effort normal and  breath sounds normal.  Neurological: He is alert.  Psychiatric: He has a normal mood and affect.  Vitals reviewed.  Lab Results  Component Value Date   WBC 8.2 07/27/2015   HGB 12.4 (L) 07/27/2015   HCT 38.6 (L) 07/27/2015   PLT 172 07/27/2015   GLUCOSE 182 (H) 07/27/2015   CHOL 85 06/20/2015   TRIG 102 06/20/2015   HDL 24 (L) 06/20/2015   LDLDIRECT 69.0 04/01/2014   LDLCALC 41 06/20/2015   ALT 18 07/27/2015   AST 23 07/27/2015   NA 139 07/27/2015   K 4.3 07/27/2015   CL 106 07/27/2015   CREATININE 1.10 07/27/2015   BUN 24 (H) 07/27/2015   CO2 25 07/27/2015   INR 1.11 07/27/2015   HGBA1C 7.0 (H) 06/18/2015   Assessment & Plan:   Problem List Items Addressed This Visit    Type 2 diabetes mellitus with complication, without long-term current use of insulin ([place]) - Primary    Has been stable but patient has stopped Amaryl. Advised compliance with metformin and Amaryl. A1c today.      Relevant Orders   Hemoglobin A1c   Hyperlipidemia    Stable. Lipid panel today. Continue Lipitor.      Relevant Orders   Lipid panel   Bradycardia  EKG revealed marked sinus bradycardia, widened QRS. Discussed with Cardiology, Dr. Rockey Situ. Stopping Coreg. If persists after cessation, will have him see Dr. Caryl Comes (EP; 02/04/16 @ 10:45).      Relevant Orders   EKG 12-Lead (Completed)    Other Visit Diagnoses    Anemia, unspecified type       Relevant Orders   CBC     Meds ordered this encounter  Medications  . ALPRAZolam (XANAX) 0.25 MG tablet    Sig: take 1 tablet by mouth every 12 hours if needed for anxiety    Dispense:  60 tablet    Refill:  1  . PARoxetine (PAXIL) 40 MG tablet    Sig: Take 1 tablet (40 mg total) by mouth daily.    Dispense:  90 tablet    Refill:  1   Follow-up: Return in about 2 weeks (around 01/19/2016).  Henry

## 2016-01-05 NOTE — Progress Notes (Signed)
Pre visit review using our clinic review tool, if applicable. No additional management support is needed unless otherwise documented below in the visit note. 

## 2016-01-05 NOTE — Telephone Encounter (Signed)
Pt son called back and stated that he forgot to ask for a refill on glimepiride (AMARYL) 4 MG tablet. Please advise, thank you!  Pharmacy - [facility] [place] [facility], [place]

## 2016-01-05 NOTE — Assessment & Plan Note (Signed)
EKG revealed marked sinus bradycardia, widened QRS. Discussed with Cardiology, Dr. Rockey Situ. Stopping Coreg. If persists after cessation, will have him see Dr. Caryl Comes (EP; 02/04/16 @ 10:45).

## 2016-01-06 LAB — HEMOGLOBIN A1C: HEMOGLOBIN A1C: 7.3 % — AB (ref 4.6–6.5)

## 2016-01-06 LAB — LIPID PANEL
Cholesterol: 111 mg/dL (ref 0–200)
HDL: 42 mg/dL (ref >39.00)
LDL Cholesterol: 55 mg/dL (ref 0–99)
NONHDL: 68.89
Total CHOL/HDL Ratio: 3
Triglycerides: 67 mg/dL (ref 0.0–149.0)
VLDL: 13.4 mg/dL (ref 0.0–40.0)

## 2016-01-06 LAB — CBC
HEMATOCRIT: 38.5 % — AB (ref 39.0–52.0)
HEMOGLOBIN: 12.9 g/dL — AB (ref 13.0–17.0)
MCHC: 33.6 g/dL (ref 30.0–36.0)
MCV: 102.3 fl — ABNORMAL HIGH (ref 78.0–100.0)
Platelets: 186 10*3/uL (ref 150.0–400.0)
RBC: 3.76 Mil/uL — ABNORMAL LOW (ref 4.22–5.81)
RDW: 13.3 % (ref 11.5–15.5)
WBC: 7.7 10*3/uL (ref 4.0–10.5)

## 2016-01-21 ENCOUNTER — Ambulatory Visit: Payer: Self-pay | Admitting: Family Medicine

## 2016-01-29 ENCOUNTER — Ambulatory Visit (INDEPENDENT_AMBULATORY_CARE_PROVIDER_SITE_OTHER): Payer: Medicare HMO | Admitting: Family Medicine

## 2016-01-29 DIAGNOSIS — R001 Bradycardia, unspecified: Secondary | ICD-10-CM

## 2016-01-29 DIAGNOSIS — G250 Essential tremor: Secondary | ICD-10-CM | POA: Diagnosis not present

## 2016-01-29 MED ORDER — PRIMIDONE 50 MG PO TABS: 75.0000 mg | ORAL_TABLET | Freq: Two times a day (BID) | ORAL | 1 refills | Status: DC

## 2016-01-29 NOTE — Assessment & Plan Note (Signed)
Improved. Normal Rate today. Will continue to monitor closely off beta blocker (especially in the setting of atrial fibrillation).

## 2016-01-29 NOTE — Assessment & Plan Note (Signed)
Established problem, worsening. Increasing primidone to 75 mg twice a day.

## 2016-01-29 NOTE — Patient Instructions (Signed)
Continue your meds. I increased the primodine.  Follow up in 3 months.  Take care  Dr. Lacinda Axon

## 2016-01-29 NOTE — Progress Notes (Signed)
Subjective:  Patient ID: Chase Simmons, male    DOB: January 23, 1933  Age: 83 y.o. MRN: [id number]  CC: Follow up Bradycardia, worsening tremor  HPI:  83 year old male with DM-2, HTN, HLD, A fib, Bradycardia, CAD, and essential tremor presents for the follow up regarding bradycardia. Reports worsening tremor.  Bradycardia  Feeling well.  HR improved since stopping beta blocker.  No chest pain or SOB at this time.  Tremor  Patient reports his tremor has recently been worsening. It makes it difficult for him to drink or hold objects.  As a result, he took 2 pills of his primidone with improvement.  He would like to discuss increasing his dosage today.  Social Hx   Social History   Social History  . Marital status: Legally Separated    Spouse name: N/A  . Number of children: 3  . Years of education: 12   Occupational History  . Retired Administrator    Social History Main Topics  . Smoking status: Former Smoker    Packs/day: 1.50    Years: 40.00  . Smokeless tobacco: Never Used  . Alcohol use 7.2 oz/week    12 Standard drinks or equivalent per week     Comment: 1-2 drinks daily  . Drug use: No  . Sexual activity: Not on file   Other Topics Concern  . Not on file   Social History Narrative   Patient grew up in [place], [place]. He is separated from his wife since 98. They never divorced. Patient has 1 son and 2 daughters. 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    Review of Systems  Constitutional: Negative.   Respiratory: Negative.   Cardiovascular: Negative.   Neurological: Positive for tremors.   Objective:  BP 134/81   Pulse 63   Temp 98.2 F (36.8 C)   Wt 234 lb 12.8 oz (106.5 kg)   SpO2 96%   BMI 32.75 kg/m   BP/Weight 01/29/2016 01/05/2016 99991111  Systolic BP [id number] 99991111 123456  Diastolic BP 81 78 64  Wt. (Lbs) 234.8 231.8 222.13  BMI 32.75 32.33 30.98   Physical Exam  Constitutional: He is oriented to person, place, and time.    Chronically ill-appearing male in no acute distress.  Cardiovascular: Normal rate and regular rhythm.   Pulmonary/Chest: Effort normal and breath sounds normal.  Neurological: He is alert and oriented to person, place, and time.  Hand tremor noted.  Psychiatric: He has a normal mood and affect.  Vitals reviewed.  Lab Results  Component Value Date   WBC 7.7 01/05/2016   HGB 12.9 (L) 01/05/2016   HCT 38.5 (L) 01/05/2016   PLT 186.0 01/05/2016   GLUCOSE 182 (H) 07/27/2015   CHOL 111 01/05/2016   TRIG 67.0 01/05/2016   HDL 42.00 01/05/2016   LDLDIRECT 69.0 04/01/2014   LDLCALC 55 01/05/2016   ALT 18 07/27/2015   AST 23 07/27/2015   NA 139 07/27/2015   K 4.3 07/27/2015   CL 106 07/27/2015   CREATININE 1.10 07/27/2015   BUN 24 (H) 07/27/2015   CO2 25 07/27/2015   INR 1.11 07/27/2015   HGBA1C 7.3 (H) 01/05/2016    Assessment & Plan:   Problem List Items Addressed This Visit    Tremor, essential    Established problem, worsening. Increasing primidone to 75 mg twice a day.      Bradycardia    Improved. Normal Rate today. Will continue to monitor closely off  beta blocker (especially in the setting of atrial fibrillation).         Meds ordered this encounter  Medications  . primidone (MYSOLINE) 50 MG tablet    Sig: Take 1.5 tablets (75 mg total) by mouth 2 (two) times daily.    Dispense:  270 tablet    Refill:  1    Follow-up: 3 months  [place] DO [facility]

## 2016-01-31 DIAGNOSIS — E1165 Type 2 diabetes mellitus with hyperglycemia: Secondary | ICD-10-CM | POA: Diagnosis not present

## 2016-01-31 DIAGNOSIS — J441 Chronic obstructive pulmonary disease with (acute) exacerbation: Secondary | ICD-10-CM | POA: Diagnosis not present

## 2016-01-31 DIAGNOSIS — N390 Urinary tract infection, site not specified: Secondary | ICD-10-CM | POA: Diagnosis not present

## 2016-01-31 DIAGNOSIS — I10 Essential (primary) hypertension: Secondary | ICD-10-CM | POA: Diagnosis not present

## 2016-01-31 DIAGNOSIS — J449 Chronic obstructive pulmonary disease, unspecified: Secondary | ICD-10-CM | POA: Diagnosis not present

## 2016-01-31 DIAGNOSIS — M6281 Muscle weakness (generalized): Secondary | ICD-10-CM | POA: Diagnosis not present

## 2016-02-04 ENCOUNTER — Ambulatory Visit: Payer: Self-pay | Admitting: Internal Medicine

## 2016-02-09 DIAGNOSIS — N401 Enlarged prostate with lower urinary tract symptoms: Secondary | ICD-10-CM | POA: Diagnosis not present

## 2016-02-09 DIAGNOSIS — C672 Malignant neoplasm of lateral wall of bladder: Secondary | ICD-10-CM | POA: Diagnosis not present

## 2016-02-09 DIAGNOSIS — R3129 Other microscopic hematuria: Secondary | ICD-10-CM | POA: Diagnosis not present

## 2016-02-17 ENCOUNTER — Ambulatory Visit (INDEPENDENT_AMBULATORY_CARE_PROVIDER_SITE_OTHER): Payer: Medicare HMO

## 2016-02-17 ENCOUNTER — Ambulatory Visit: Payer: Self-pay

## 2016-02-17 VITALS — BP 130/80 | HR 61 | Temp 97.3°F | Resp 14 | Ht 71.0 in | Wt 235.0 lb

## 2016-02-17 DIAGNOSIS — Z0000 Encounter for general adult medical examination without abnormal findings: Secondary | ICD-10-CM | POA: Diagnosis not present

## 2016-02-17 NOTE — Progress Notes (Signed)
Care was provided under my supervision. I agree with the management as indicated in the note.  Shadara Lopez DO  

## 2016-02-17 NOTE — Progress Notes (Signed)
Subjective:   Chase Simmons is a 83 y.o. male who presents for an Initial Medicare Annual Wellness Visit.  Review of Systems  No ROS.  Medicare Wellness Visit. Cardiac Risk Factors include: advanced age (>26men, >58 women);hypertension;diabetes mellitus;male gender    Objective:    Today's Vitals   02/17/16 1110  BP: 130/80  Pulse: 61  Resp: 14  Temp: 97.3 F (36.3 C)  TempSrc: Oral  SpO2: 99%  Weight: 235 lb (106.6 kg)  Height: 5\' 11"  (1.803 m)   Body mass index is 32.78 kg/m.  Current Medications (verified) Outpatient Encounter Prescriptions as of 02/17/2016  Medication Sig  . acetaminophen (TYLENOL) 325 MG tablet Take 2 tablets (650 mg total) by mouth every 6 (six) hours as needed for mild pain (or Fever >/= 101).  Marland Kitchen ALPRAZolam (XANAX) 0.25 MG tablet take 1 tablet by mouth every 12 hours if needed for anxiety  . atorvastatin (LIPITOR) 80 MG tablet TAKE 1 TABLET ONE TIME DAILY  . fexofenadine (ALLEGRA) 180 MG tablet Take 180 mg by mouth daily as needed for allergies or rhinitis.  Marland Kitchen glimepiride (AMARYL) 4 MG tablet take 1 tablet by mouth WITH BREAKFAST.  Marland Kitchen ipratropium-albuterol (DUONEB) 0.5-2.5 (3) MG/3ML SOLN Take 3 mLs by nebulization every 6 (six) hours as needed.  . metFORMIN (GLUCOPHAGE) 1000 MG tablet TAKE 1 TABLET TWICE DAILY  WITH  FOOD  . ondansetron (ZOFRAN) 4 MG tablet Take 1 tablet (4 mg total) by mouth every 8 (eight) hours as needed for nausea or vomiting.  Marland Kitchen PARoxetine (PAXIL) 40 MG tablet Take 1 tablet (40 mg total) by mouth daily.  . primidone (MYSOLINE) 50 MG tablet Take 1.5 tablets (75 mg total) by mouth 2 (two) times daily.  Marland Kitchen tiotropium (SPIRIVA) 18 MCG inhalation capsule Place 1 capsule (18 mcg total) into inhaler and inhale daily.   No facility-administered encounter medications on file as of 02/17/2016.     Allergies (verified) Latex   History: Past Medical History:  Diagnosis Date  . Allergy   . Arrhythmia   . Cancer [facility]) pt unsure of  year   bladder cancer-Dr. Jacqlyn Larsen  . Conversion disorder with seizures or convulsions   . COPD (chronic obstructive pulmonary disease) ([place])   . Diabetes mellitus without complication ([place])   . Dropped heart beats 07/25/2011  . Hyperlipidemia   . Hypertension   . Tremor, essential    sees [facility] neurologist  . Urinary incontinence   . UTI (lower urinary tract infection)   . Vitamin D deficiency    Past Surgical History:  Procedure Laterality Date  . bladder cancer    . CATARACT EXTRACTION    . HERNIA REPAIR     Family History  Problem Relation Age of Onset  . Arthritis Mother   . Dementia Mother   . Cancer Maternal Uncle     prostate  . Cancer Maternal Aunt     breast cancer   Social History   Occupational History  . Retired Administrator    Social History Main Topics  . Smoking status: Former Smoker    Packs/day: 1.50    Years: 40.00  . Smokeless tobacco: Never Used  . Alcohol use 7.2 oz/week    12 Standard drinks or equivalent per week     Comment: 1 drink with dinner  . Drug use: No  . Sexual activity: No   Tobacco Counseling Counseling given: No   Activities of Daily Living In your present state of health, do  you have any difficulty performing the following activities: 02/17/2016 06/19/2015  Hearing? Tempie Donning  Vision? N Y  Difficulty concentrating or making decisions? Tempie Donning  Walking or climbing stairs? Y Y  Dressing or bathing? N Y  Doing errands, shopping? Tempie Donning  Preparing Food and eating ? N -  Using the Toilet? N -  In the past six months, have you accidently leaked urine? Y -  Do you have problems with loss of bowel control? N -  Managing your Medications? Y -  Managing your Finances? Y -  Housekeeping or managing your Housekeeping? Y -  Some recent data might be hidden    Immunizations and Health Maintenance Immunization History  Administered Date(s) Administered  . Influenza, High Dose Seasonal PF 10/05/2015  . Influenza,inj,Quad PF,36+ Mos 01/08/2015    . Pneumococcal Polysaccharide-23 01/03/2010   Health Maintenance Due  Topic Date Due  . URINE MICROALBUMIN  02/01/1945  . TETANUS/TDAP  02/01/1954  . ZOSTAVAX  02/02/1995  . OPHTHALMOLOGY EXAM  07/08/2015  . FOOT EXAM  01/08/2016    Patient Care Team: Coral Spikes, DO as PCP - General (Family Medicine)  Indicate any recent Medical Services you may have received from other than [facility] providers in the past year (date may be approximate).    Assessment:   This is a routine wellness examination for [place]. The goal of the wellness visit is to assist the patient how to close the gaps in care and create a preventative care plan for the patient.   Taking calcium VIT D as appropriate/Osteoporosis risk reviewed.  Medications reviewed; taking without issues or barriers.  Safety issues reviewed; smoke detectors in the home. No firearms in the home. Wears seatbelts when driving or riding with others. No violence in the home.  No identified risk were noted; The patient was oriented x 3; appropriate in dress and manner and no objective failures at ADL's or IADL's.   BMI; discussed the importance of a healthy diet, water intake and exercise. Educational material provided.  HTN; followed by PCP.  TDAP and ZOSTAVAX vaccine deferred per patient request.  Educational material provided.  Patient Concerns: None at this time. Follow up with PCP as needed.  Hearing/Vision screen Hearing Screening Comments: [facility] ( [place]) Difficulty hearing a whisper R ear worse than L  Audiology testing deferred in the last year per patient preference Vision Screening Comments: Visual acuity not assessed per patient preference since he has regular follow up with his ophthalmologist. Cataract extraction, L eye.   Dietary issues and exercise activities discussed: Current Exercise Habits: Home exercise routine, Type of exercise: walking;stretching (Walks on the stair steps when family is with  him.), Time (Minutes): 10, Frequency (Times/Week): 2, Weekly Exercise (Minutes/Week): 20, Intensity: Mild  Goals    . Increase water intake      Depression Screen PHQ 2/9 Scores 02/17/2016 01/08/2015  PHQ - 2 Score 0 0    Fall Risk Fall Risk  02/17/2016 01/08/2015  Falls in the past year? Yes No  Number falls in past yr: 1 -  Injury with Fall? No -  Risk for fall due to : Impaired balance/gait -  Follow up Falls prevention discussed -    Cognitive Function:     6CIT Screen 02/17/2016  What Year? 0 points  What month? 0 points  What time? 0 points  Count back from 20 0 points  Months in reverse 0 points    Screening Tests Health Maintenance  Topic  Date Due  . URINE MICROALBUMIN  02/01/1945  . TETANUS/TDAP  02/01/1954  . ZOSTAVAX  02/02/1995  . OPHTHALMOLOGY EXAM  07/08/2015  . FOOT EXAM  01/08/2016  . HEMOGLOBIN A1C  07/04/2016  . INFLUENZA VACCINE  Completed        Plan:    End of life planning; Advance aging; Advanced directives discussed. No HCPOA/Living Will.  Additional information provided to help him start the conversation with his family.  Copy of HCPOA/Living Will short forms requested upon completion.  Time spent on this topic is 27 minutes.  Medicare Attestation I have personally reviewed: The patient's medical and social history Their use of alcohol, tobacco or illicit drugs Their current medications and supplements The patient's functional ability including ADLs,fall risks, home safety risks, cognitive, and hearing and visual impairment Diet and physical activities Evidence for depression   The patient's weight, height, BMI, and visual acuity have been recorded in the chart.  I have made referrals and provided education to the patient based on review of the above and I have provided the patient with a written personalized care plan for preventive services.    During the course of the visit Zai was educated and counseled about the following  appropriate screening and preventive services:   Vaccines to include Pneumoccal, Influenza, Hepatitis B, Td, Zostavax, HCV  Electrocardiogram  Colorectal cancer screening  Cardiovascular disease screening  Diabetes screening  Glaucoma screening  Nutrition counseling  Prostate cancer screening  Smoking cessation counseling  Patient Instructions (the written plan) were given to the patient.   Varney Biles, LPN   [id number]

## 2016-02-17 NOTE — Patient Instructions (Addendum)
  Mr. Chase Simmons , Thank you for taking time to come for your Medicare Wellness Visit. I appreciate your ongoing commitment to your health goals. Please review the following plan we discussed and let me know if I can assist you in the future.   Follow up with Dr. Lacinda Axon as needed.  These are the goals we discussed: Goals    . Increase water intake       This is a list of the screening recommended for you and due dates:  Health Maintenance  Topic Date Due  . Urine Protein Check  02/01/1945  . Tetanus Vaccine  02/01/1954  . Shingles Vaccine  02/02/1995  . Eye exam for diabetics  07/08/2015  . Complete foot exam   01/08/2016  . Hemoglobin A1C  07/04/2016  . Flu Shot  Completed

## 2016-03-02 DIAGNOSIS — J449 Chronic obstructive pulmonary disease, unspecified: Secondary | ICD-10-CM | POA: Diagnosis not present

## 2016-03-02 DIAGNOSIS — N390 Urinary tract infection, site not specified: Secondary | ICD-10-CM | POA: Diagnosis not present

## 2016-03-02 DIAGNOSIS — M6281 Muscle weakness (generalized): Secondary | ICD-10-CM | POA: Diagnosis not present

## 2016-03-02 DIAGNOSIS — E1165 Type 2 diabetes mellitus with hyperglycemia: Secondary | ICD-10-CM | POA: Diagnosis not present

## 2016-03-02 DIAGNOSIS — I10 Essential (primary) hypertension: Secondary | ICD-10-CM | POA: Diagnosis not present

## 2016-03-02 DIAGNOSIS — J441 Chronic obstructive pulmonary disease with (acute) exacerbation: Secondary | ICD-10-CM | POA: Diagnosis not present

## 2016-03-15 ENCOUNTER — Encounter: Payer: Self-pay | Admitting: Family Medicine

## 2016-03-15 ENCOUNTER — Ambulatory Visit (INDEPENDENT_AMBULATORY_CARE_PROVIDER_SITE_OTHER): Payer: Medicare HMO | Admitting: Family Medicine

## 2016-03-15 VITALS — BP 146/84 | HR 55 | Temp 98.0°F | Wt 285.4 lb

## 2016-03-15 DIAGNOSIS — J449 Chronic obstructive pulmonary disease, unspecified: Secondary | ICD-10-CM | POA: Insufficient documentation

## 2016-03-15 DIAGNOSIS — I10 Essential (primary) hypertension: Secondary | ICD-10-CM

## 2016-03-15 DIAGNOSIS — E118 Type 2 diabetes mellitus with unspecified complications: Secondary | ICD-10-CM | POA: Diagnosis not present

## 2016-03-15 DIAGNOSIS — R319 Hematuria, unspecified: Secondary | ICD-10-CM | POA: Diagnosis not present

## 2016-03-15 DIAGNOSIS — I480 Paroxysmal atrial fibrillation: Secondary | ICD-10-CM

## 2016-03-15 LAB — POCT URINALYSIS DIPSTICK
Bilirubin, UA: NEGATIVE
Glucose, UA: NEGATIVE
LEUKOCYTES UA: NEGATIVE
NITRITE UA: NEGATIVE
PH UA: 5
PROTEIN UA: NEGATIVE
RBC UA: NEGATIVE
Spec Grav, UA: 1.02
Urobilinogen, UA: 0.2

## 2016-03-15 LAB — MICROALBUMIN / CREATININE URINE RATIO
Creatinine,U: 110.7 mg/dL
Microalb Creat Ratio: 1.9 mg/g (ref 0.0–30.0)
Microalb, Ur: 2.1 mg/dL — ABNORMAL HIGH (ref 0.0–1.9)

## 2016-03-15 MED ORDER — ASPIRIN EC 81 MG PO TBEC: 81.0000 mg | DELAYED_RELEASE_TABLET | Freq: Every day | ORAL | 3 refills | Status: DC

## 2016-03-15 MED ORDER — LISINOPRIL 5 MG PO TABS: 5.0000 mg | ORAL_TABLET | Freq: Every day | ORAL | 3 refills | Status: DC

## 2016-03-15 NOTE — Progress Notes (Signed)
Subjective:  Patient ID: Chase Simmons, male    DOB: [birth year]  Age: 83 y.o. MRN: [id number]  CC: Follow up, ? Hematuria  HPI:  83 year old male with extensive past medical history including CAD, COPD, DM-2, HTN, HLD, Hx of Afib (with recent bradycardia, now off Beta blocker) presents for follow up. Son reports recent blood noted in his underwear.  DM-2  Has been stable on Amaryl and metformin.  Last A1C was 7.3 in January.  Needs ACEI (BP can tolerate).  Need to revisit aspirin.  HTN  BP slightly elevated.  Patient is not on anything for blood pressure at this time as his beta blocker was recently decreased and discontinued secondary to bradycardia.  Afib  Has not been on anticoagulation due to noncompliance per EMR.  Not Followed by cardiology any longer. We'll revisit today.  Off beta blocker due to bradycardia.  Not currently on aspirin either. This is reportedly due to noncompliance as well.  Hematuria  Patient reports a prior history of hematuria. He states that it occurred 3-4 weeks ago.  He has ongoing issues with urination secondary to BPH.  He is followed by urology.  No further hematuria noted.  Social Hx   Social History   Social History  . Marital status: Legally Separated    Spouse name: N/A  . Number of children: 3  . Years of education: 12   Occupational History  . Retired Administrator    Social History Main Topics  . Smoking status: Former Smoker    Packs/day: 1.50    Years: 40.00  . Smokeless tobacco: Never Used  . Alcohol use 7.2 oz/week    12 Standard drinks or equivalent per week     Comment: 1 drink with dinner  . Drug use: No  . Sexual activity: No   Other Topics Concern  . None   Social History Narrative   Patient grew up in [place], [place]. He is separated from his wife since 83. They never divorced. Patient has 1 son and 2 daughters. 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    Review of Systems    Constitutional: Negative.   Genitourinary: Positive for difficulty urinating and hematuria.   Objective:  BP (!) 146/84   Pulse (!) 55   Temp 98 F (36.7 C) (Oral)   Wt 285 lb 6 oz (129.4 kg)   SpO2 98%   BMI 39.80 kg/m   BP/Weight 03/15/2016 02/17/2016 2/75/1700  Systolic BP 174 944 967  Diastolic BP 84 80 81  Wt. (Lbs) 285.38 235 234.8  BMI 39.8 32.78 32.75   Physical Exam  Constitutional: He appears well-developed. No distress.  Cardiovascular: Regular rhythm.   Bradycardia.  Pulmonary/Chest: Effort normal and breath sounds normal.  Abdominal: Soft. He exhibits no distension. There is no tenderness.  Neurological: He is alert.  Vitals reviewed.  Lab Results  Component Value Date   WBC 7.7 01/05/2016   HGB 12.9 (L) 01/05/2016   HCT 38.5 (L) 01/05/2016   PLT 186.0 01/05/2016   GLUCOSE 182 (H) 07/27/2015   CHOL 111 01/05/2016   TRIG 67.0 01/05/2016   HDL 42.00 01/05/2016   LDLDIRECT 69.0 04/01/2014   LDLCALC 55 01/05/2016   ALT 18 07/27/2015   AST 23 07/27/2015   NA 139 07/27/2015   K 4.3 07/27/2015   CL 106 07/27/2015   CREATININE 1.10 07/27/2015   BUN 24 (H) 07/27/2015   CO2 25 07/27/2015   INR  1.11 07/27/2015   HGBA1C 7.3 (H) 01/05/2016    Assessment & Plan:   Problem List Items Addressed This Visit    Type 2 diabetes mellitus with complication, without long-term current use of insulin ([place]) - Primary    Stable. Continue metformin and Amaryl. Adding lisinopril. Urine microalbumin today. Starting aspirin as well.      Relevant Medications   aspirin EC 81 MG tablet   lisinopril (PRINIVIL,ZESTRIL) 5 MG tablet   Other Relevant Orders   Urine Microalbumin w/creat. ratio   HTN (hypertension)    Starting lisinopril. BMP in 7-10 days.      Relevant Medications   aspirin EC 81 MG tablet   lisinopril (PRINIVIL,ZESTRIL) 5 MG tablet   Hematuria    Reports of hematuria. Suspect that this was post cystoscopy. Urine negative today.  Advised follow up  with Urology.      Relevant Orders   POCT Urinalysis Dipstick (Completed)   Atrial fibrillation (H[facility])    Currently in sinus rhythm. Referring to cardiology. Aspirin 81 mg daily.      Relevant Medications   aspirin EC 81 MG tablet   lisinopril (PRINIVIL,ZESTRIL) 5 MG tablet   Other Relevant Orders   Ambulatory referral to Cardiology      Meds ordered this encounter  Medications  . aspirin EC 81 MG tablet    Sig: Take 1 tablet (81 mg total) by mouth daily.    Dispense:  90 tablet    Refill:  3  . lisinopril (PRINIVIL,ZESTRIL) 5 MG tablet    Sig: Take 1 tablet (5 mg total) by mouth daily.    Dispense:  90 tablet    Refill:  3    Follow-up: 3 months  [place] DO [facility]

## 2016-03-15 NOTE — Assessment & Plan Note (Signed)
Stable. Continue metformin and Amaryl. Adding lisinopril. Urine microalbumin today. Starting aspirin as well.

## 2016-03-15 NOTE — Progress Notes (Signed)
Pre visit review using our clinic review tool, if applicable. No additional management support is needed unless otherwise documented below in the visit note. 

## 2016-03-15 NOTE — Assessment & Plan Note (Signed)
Currently in sinus rhythm. Referring to cardiology. Aspirin 81 mg daily.

## 2016-03-15 NOTE — Patient Instructions (Addendum)
Be sure he takes aspirin daily.  Urine was negative. This may have been from his recent cystoscopy.  I am going to have him establish with cardiology given prior afib (to monitor closely).  He needs his eyes checked this year.  I started him on a medication for his blood pressure (lisinopril). He needs labs in 7-10 days.  Follow up in 3 months.  Take care  Dr. Lacinda Axon

## 2016-03-15 NOTE — Assessment & Plan Note (Signed)
Starting lisinopril. BMP in 7-10 days.

## 2016-03-15 NOTE — Assessment & Plan Note (Signed)
Reports of hematuria. Suspect that this was post cystoscopy. Urine negative today.  Advised follow up with Urology.

## 2016-03-23 ENCOUNTER — Ambulatory Visit: Payer: Self-pay | Admitting: Cardiology

## 2016-03-30 DIAGNOSIS — M6281 Muscle weakness (generalized): Secondary | ICD-10-CM | POA: Diagnosis not present

## 2016-03-30 DIAGNOSIS — N390 Urinary tract infection, site not specified: Secondary | ICD-10-CM | POA: Diagnosis not present

## 2016-03-30 DIAGNOSIS — J441 Chronic obstructive pulmonary disease with (acute) exacerbation: Secondary | ICD-10-CM | POA: Diagnosis not present

## 2016-03-30 DIAGNOSIS — I10 Essential (primary) hypertension: Secondary | ICD-10-CM | POA: Diagnosis not present

## 2016-03-30 DIAGNOSIS — J449 Chronic obstructive pulmonary disease, unspecified: Secondary | ICD-10-CM | POA: Diagnosis not present

## 2016-03-30 DIAGNOSIS — E1165 Type 2 diabetes mellitus with hyperglycemia: Secondary | ICD-10-CM | POA: Diagnosis not present

## 2016-04-13 ENCOUNTER — Ambulatory Visit (INDEPENDENT_AMBULATORY_CARE_PROVIDER_SITE_OTHER): Payer: Medicare HMO | Admitting: Cardiology

## 2016-04-13 ENCOUNTER — Encounter: Payer: Self-pay | Admitting: Cardiology

## 2016-04-13 ENCOUNTER — Ambulatory Visit (INDEPENDENT_AMBULATORY_CARE_PROVIDER_SITE_OTHER): Payer: Medicare HMO

## 2016-04-13 VITALS — BP 132/62 | HR 47 | Ht 71.0 in | Wt 240.0 lb

## 2016-04-13 DIAGNOSIS — R001 Bradycardia, unspecified: Secondary | ICD-10-CM

## 2016-04-13 DIAGNOSIS — I4891 Unspecified atrial fibrillation: Secondary | ICD-10-CM

## 2016-04-13 DIAGNOSIS — I10 Essential (primary) hypertension: Secondary | ICD-10-CM | POA: Diagnosis not present

## 2016-04-13 DIAGNOSIS — E784 Other hyperlipidemia: Secondary | ICD-10-CM | POA: Diagnosis not present

## 2016-04-13 DIAGNOSIS — E7849 Other hyperlipidemia: Secondary | ICD-10-CM

## 2016-04-13 NOTE — Progress Notes (Addendum)
Cardiology Office Note   Date:  04/13/2016   ID:  Chase Simmons, DOB [birth year], MRN [id number]  Referring Doctor:  Coral Spikes, DO   Cardiologist:   Wende Bushy, MD   Reason for consultation:  Chief Complaint  Patient presents with  . other    Former Dr. Saralyn Pilar patient for Hx. A-fib. Meds reviewed by the pt. verbally.       History of Present Illness: Chase Simmons is a 83 y.o. male who presents for transfer of care for history of atrial fibrillation. Patient is a poor historian. His son Chase Simmons was present to help with history.  He reports that he's had atrial fibrillation for many years now. He is not aware of the irregular heartbeat. No chest pain. Limited functional capacity due to advanced age. Mainly wheelchair bound. [facility]. Last fall was 3 months ago.Patient does not recall this. His son reports that he was in the bathroom and he had a mechanical fall. Patient reports that he has trouble with his balance.  Review of records indicate that there has been an issue with compliance to medications and therefore he was not continued on oral anticoagulation. He was last seen by Dr. Saralyn Pilar in 2015.also had bradycardia and therefore metoprolol was stopped.  No PND, orthopnea, edema.   ROS:  Please see the history of present illness. Aside from mentioned under HPI, all other systems are reviewed and negative.     Past Medical History:  Diagnosis Date  . Allergy   . Atrial fibrillation ([place]) 08/30/2013  . Cancer [facility]) pt unsure of year   bladder cancer-Dr. Jacqlyn Larsen  . COPD (chronic obstructive pulmonary disease) ([place])   . Coronary atherosclerosis of native coronary artery 01/05/2016  . Hyperlipidemia   . Hypertension   . Tremor, essential    sees [facility] neurologist  . Type 2 diabetes mellitus with complication, without long-term current use of insulin ([place]) 08/01/2012  . Urinary incontinence     Past Surgical History:  Procedure  Laterality Date  . bladder cancer    . CATARACT EXTRACTION    . HERNIA REPAIR       reports that he has quit smoking. He has a 60.00 pack-year smoking history. He has never used smokeless tobacco. He reports that he drinks about 7.2 oz of alcohol per week . He reports that he does not use drugs.   family history includes Arthritis in his mother; Cancer in his maternal aunt and maternal uncle; Dementia in his mother.   Outpatient Medications Prior to Visit  Medication Sig Dispense Refill  . acetaminophen (TYLENOL) 325 MG tablet Take 2 tablets (650 mg total) by mouth every 6 (six) hours as needed for mild pain (or Fever >/= 101).    Marland Kitchen ALPRAZolam (XANAX) 0.25 MG tablet take 1 tablet by mouth every 12 hours if needed for anxiety 60 tablet 1  . aspirin EC 81 MG tablet Take 1 tablet (81 mg total) by mouth daily. 90 tablet 3  . atorvastatin (LIPITOR) 80 MG tablet TAKE 1 TABLET ONE TIME DAILY 90 tablet 3  . fexofenadine (ALLEGRA) 180 MG tablet Take 180 mg by mouth daily as needed for allergies or rhinitis.    Marland Kitchen glimepiride (AMARYL) 4 MG tablet take 1 tablet by mouth WITH BREAKFAST. 90 tablet 0  . ipratropium-albuterol (DUONEB) 0.5-2.5 (3) MG/3ML SOLN Take 3 mLs by nebulization every 6 (six) hours as needed. 360 mL   . lisinopril (PRINIVIL,ZESTRIL) 5  MG tablet Take 1 tablet (5 mg total) by mouth daily. 90 tablet 3  . metFORMIN (GLUCOPHAGE) 1000 MG tablet TAKE 1 TABLET TWICE DAILY  WITH  FOOD 180 tablet 3  . ondansetron (ZOFRAN) 4 MG tablet Take 1 tablet (4 mg total) by mouth every 8 (eight) hours as needed for nausea or vomiting. 21 tablet 0  . PARoxetine (PAXIL) 40 MG tablet Take 1 tablet (40 mg total) by mouth daily. 90 tablet 1  . primidone (MYSOLINE) 50 MG tablet Take 1.5 tablets (75 mg total) by mouth 2 (two) times daily. 270 tablet 1  . tiotropium (SPIRIVA) 18 MCG inhalation capsule Place 1 capsule (18 mcg total) into inhaler and inhale daily. 30 capsule 5   No facility-administered  medications prior to visit.      Allergies: Latex    PHYSICAL EXAM: VS:  BP 132/62 (BP Location: Left Arm, Patient Position: Sitting, Cuff Size: Normal)   Pulse (!) 47   Ht 5\' 11"  (1.803 m)   Wt 240 lb (108.9 kg)   BMI 33.47 kg/m  , Body mass index is 33.47 kg/m. Wt Readings from Last 3 Encounters:  04/13/16 240 lb (108.9 kg)  03/15/16 285 lb 6 oz (129.4 kg)  02/17/16 235 lb (106.6 kg)    GENERAL:  well developed, well nourished, obese, not in acute distress HEENT: normocephalic, pink conjunctivae, anicteric sclerae, no xanthelasma, normal dentition, oropharynx clear NECK:  no neck vein engorgement, JVP normal, no hepatojugular reflux, carotid upstroke brisk and symmetric, no bruit, no thyromegaly, no lymphadenopathy LUNGS:  good respiratory effort, clear to auscultation bilaterally CV:  PMI not displaced, no thrills, no lifts, somewhat irregular, S2 within normal limits, no palpable S3 or S4, no murmurs, no rubs, no gallops ABD:  Soft, nontender, nondistended, normoactive bowel sounds, no abdominal aortic bruit, no hepatomegaly, no splenomegaly MS: nontender back, + kyphosis, no joint deformities EXT:  2+ DP/PT pulses, no edema, no varicosities, no cyanosis, no clubbing SKIN: warm, nondiaphoretic, normal turgor, no ulcers NEUROPSYCH: alert, oriented to person, place, and time, sensory/motor grossly intact, normal mood, appropriate affect  Recent Labs: 06/20/2015: B Natriuretic Peptide 16.0; Magnesium 1.8 07/27/2015: ALT 18; BUN 24; Creatinine, Ser 1.10; Potassium 4.3; Sodium 139 01/05/2016: Hemoglobin 12.9; Platelets 186.0   Lipid Panel    Component Value Date/Time   CHOL 111 01/05/2016 1530   TRIG 67.0 01/05/2016 1530   HDL 42.00 01/05/2016 1530   CHOLHDL 3 01/05/2016 1530   VLDL 13.4 01/05/2016 1530   LDLCALC 55 01/05/2016 1530   LDLDIRECT 69.0 04/01/2014 1553     Other studies Reviewed:  EKG:  The ekg from04/11/2016 was personally reviewed by me and it revealed  appears to be atrial fibrillation, ventricular rate was 53 BPM. possible LVH with QRS widening. Nonspecific ST-T wave changes.compared to EKG from 01/05/2016, heart rate was 41 at that time. Similar QRS.  Additional studies/ records that were reviewed personally reviewed by me today include:  Echo 06/21/2015: Procedure narrative: Transthoracic echocardiography. Image   quality was poor. The study was technically difficult, as a   result of poor sound wave transmission. - Left ventricle: The cavity size was normal. Systolic function was   normal. The estimated ejection fraction was 50%. Doppler   parameters are consistent with abnormal left ventricular   relaxation (grade 1 diastolic dysfunction). - Aortic valve: Valve area (Vmax): 2.31 cm^2. - Right atrium: The atrium was mildly dilated.    ASSESSMENT AND PLAN:  Atrial fibrillation, question persistent CHADS2-VASc=4.Oral anticoagulation is  indicated however patient poses several risks: history of noncompliance, history of falls. Here is also blood thinner. If he will not be, aspirin 325 mg by mouth daily is recommended.  Bradycardia He likely has underlying conduction disease recommend long-term monitor for further evaluation.  We discussed possibility of needing a stress test in the future. Likely to be done after the Holter monitor. Patient is unclear whether he is even interested in any invasive testing or treatment if stress test is found to be abnormal.  Hypertension BP is well controlled. Continue monitoring BP. Continue current medical therapy and lifestyle changes.  Hyperlipidemia Ideal LDL goal is less than 70 due to diabetes. PCP following labs.    Current medicines are reviewed at length with the patient today.  The patient does not have concerns regarding medicines.  Labs/ tests ordered today include:  Orders Placed This Encounter  Procedures  . LONG TERM MONITOR (3-14 DAYS)  . EKG 12-Lead    I had a  lengthy and detailed discussion with the patient and son regarding diagnoses, prognosis, diagnostic options, treatment options , and side effects of medications.   Disposition:   FU with Cardiology after tests   Thank you for this consultation. We will forwarding this consultation to referring physician.   I spent at least 60 minutes with the patient today and more than 50% of the time was spent counseling the patient and coordinating care.    Signed, Wende Bushy, MD  04/13/2016 12:11 PM    [place]  This note was generated in part with voice recognition software and I apologize for any typographical errors that were not detected and corrected.

## 2016-04-13 NOTE — Patient Instructions (Signed)
Testing/Procedures: Your physician has recommended that you wear an event monitor. Event monitors are medical devices that record the heart's electrical activity. Doctors most often [place] these monitors to diagnose arrhythmias. Arrhythmias are problems with the speed or rhythm of the heartbeat. The monitor is a small, portable device. You can wear one while you do your normal daily activities. This is usually used to diagnose what is causing palpitations/syncope (passing out).    Follow-Up: Your physician recommends that you schedule a follow-up appointment as needed. We will call you with results and follow up information.   It was a pleasure seeing you today here in the office. Please do not hesitate to give [place] a call back if you have any further questions. [place], BSN

## 2016-04-16 DIAGNOSIS — I4891 Unspecified atrial fibrillation: Secondary | ICD-10-CM | POA: Diagnosis not present

## 2016-04-20 ENCOUNTER — Telehealth: Payer: Self-pay | Admitting: Cardiology

## 2016-04-20 NOTE — Telephone Encounter (Signed)
-----   Message from Valora Corporal, RN sent at 04/18/2016  3:08 PM EDT ----- Regarding: FU appt Just wanted to check on this. Patient had a zio placed and was supposed to follow up after that has done.  Thanks

## 2016-04-20 NOTE — Telephone Encounter (Signed)
Lmov for patient to call back and schedule follow up with Dr Yvone Neu

## 2016-04-20 NOTE — Telephone Encounter (Signed)
Pt called back  He is coming to see Dr Yvone Neu on 05/03/16

## 2016-04-21 DIAGNOSIS — I4891 Unspecified atrial fibrillation: Secondary | ICD-10-CM | POA: Diagnosis not present

## 2016-04-28 ENCOUNTER — Ambulatory Visit: Payer: Self-pay | Admitting: Family Medicine

## 2016-04-30 DIAGNOSIS — M6281 Muscle weakness (generalized): Secondary | ICD-10-CM | POA: Diagnosis not present

## 2016-04-30 DIAGNOSIS — J441 Chronic obstructive pulmonary disease with (acute) exacerbation: Secondary | ICD-10-CM | POA: Diagnosis not present

## 2016-04-30 DIAGNOSIS — N390 Urinary tract infection, site not specified: Secondary | ICD-10-CM | POA: Diagnosis not present

## 2016-04-30 DIAGNOSIS — E1165 Type 2 diabetes mellitus with hyperglycemia: Secondary | ICD-10-CM | POA: Diagnosis not present

## 2016-04-30 DIAGNOSIS — I10 Essential (primary) hypertension: Secondary | ICD-10-CM | POA: Diagnosis not present

## 2016-04-30 DIAGNOSIS — J449 Chronic obstructive pulmonary disease, unspecified: Secondary | ICD-10-CM | POA: Diagnosis not present

## 2016-05-03 ENCOUNTER — Ambulatory Visit: Payer: Self-pay | Admitting: Cardiology

## 2016-05-10 ENCOUNTER — Telehealth: Payer: Self-pay | Admitting: *Deleted

## 2016-05-10 NOTE — Telephone Encounter (Signed)
-----   Message from Wende Bushy, MD sent at 05/10/2016  2:29 PM EDT ----- Note of atrial fibrillation with slow average ventricular rate. As mentioned in the body of the report, several episodes were concerning for possible AV block. He has sinus node dysfunction and there is potential that over time, heart rate will continue to slow down. May need EP consultation. When discussed during last office visit, he was not even sure whether he wants anything aggressive. We can certainly refer him to EP if he would consider that.

## 2016-05-10 NOTE — Telephone Encounter (Signed)
No answer for one number and other was wrong number for patient. Also called 2 emergency contacts and both voicemail's are full and not accepting new messages. Will try these numbers again.

## 2016-05-11 NOTE — Telephone Encounter (Signed)
Voicemail box for son is full.

## 2016-05-11 NOTE — Telephone Encounter (Signed)
Left voicemail message for patient to call back.

## 2016-05-11 NOTE — Telephone Encounter (Signed)
Voicemail box is full for son

## 2016-05-13 ENCOUNTER — Encounter: Payer: Self-pay | Admitting: *Deleted

## 2016-05-13 NOTE — Telephone Encounter (Signed)
Called home number and this was the wrong number. Then called other number and left voicemail message. Will mail letter to patient to call for results.

## 2016-05-21 ENCOUNTER — Telehealth: Payer: Self-pay | Admitting: Family Medicine

## 2016-05-22 ENCOUNTER — Other Ambulatory Visit: Payer: Self-pay | Admitting: Family Medicine

## 2016-05-23 NOTE — Telephone Encounter (Signed)
Last refill was in January of this year, please advise, thanks

## 2016-05-24 NOTE — Telephone Encounter (Signed)
Is he taking this? I would like for him to not be on this.

## 2016-05-24 NOTE — Telephone Encounter (Signed)
So I tried to call him, he hung up on me when I tried to explain why I was calling.  I am not sure if he is taking it.

## 2016-05-30 DIAGNOSIS — J441 Chronic obstructive pulmonary disease with (acute) exacerbation: Secondary | ICD-10-CM | POA: Diagnosis not present

## 2016-05-30 DIAGNOSIS — J449 Chronic obstructive pulmonary disease, unspecified: Secondary | ICD-10-CM | POA: Diagnosis not present

## 2016-05-30 DIAGNOSIS — I10 Essential (primary) hypertension: Secondary | ICD-10-CM | POA: Diagnosis not present

## 2016-05-30 DIAGNOSIS — M6281 Muscle weakness (generalized): Secondary | ICD-10-CM | POA: Diagnosis not present

## 2016-05-30 DIAGNOSIS — N390 Urinary tract infection, site not specified: Secondary | ICD-10-CM | POA: Diagnosis not present

## 2016-05-30 DIAGNOSIS — E1165 Type 2 diabetes mellitus with hyperglycemia: Secondary | ICD-10-CM | POA: Diagnosis not present

## 2016-06-02 NOTE — Telephone Encounter (Signed)
Attempted to reach the son to ask, thanks

## 2016-06-02 NOTE — Telephone Encounter (Signed)
Pt son called to make an appt to see Dr. Lacinda Axon for a follow up. Pt has no more medication. Pt is scheduled for 6/15 @ 2pm. Please advise, thank you!  Pharmacy - [facility], [place]

## 2016-06-02 NOTE — Telephone Encounter (Signed)
Spoke with the son [name], patient is taking hte Xanax, 1 pill a day and he is completely out.  He has a follow up scheduled on the 15th.  Please advise. thanks

## 2016-06-02 NOTE — Telephone Encounter (Signed)
I would recommend that he not continue this medication. If he is out of it and doing well, just follow up with me.

## 2016-06-03 NOTE — Telephone Encounter (Signed)
Left message for the patient,

## 2016-06-04 ENCOUNTER — Other Ambulatory Visit: Payer: Self-pay | Admitting: Family Medicine

## 2016-06-15 ENCOUNTER — Other Ambulatory Visit: Payer: Self-pay

## 2016-06-15 MED ORDER — GLIMEPIRIDE 4 MG PO TABS: ORAL_TABLET | ORAL | 0 refills | Status: DC

## 2016-06-17 ENCOUNTER — Ambulatory Visit: Payer: Self-pay | Admitting: Family Medicine

## 2016-06-22 ENCOUNTER — Telehealth: Payer: Self-pay | Admitting: *Deleted

## 2016-06-22 NOTE — Telephone Encounter (Signed)
LMOM for daughter to call back.

## 2016-06-22 NOTE — Telephone Encounter (Signed)
Please call daughter and let her know that Dr. Lacinda Axon declined to refill this back in May.

## 2016-06-22 NOTE — Telephone Encounter (Signed)
Pt Daughter called back returning your call. Thank you!

## 2016-06-22 NOTE — Telephone Encounter (Signed)
LMOM for patient to call back.

## 2016-06-22 NOTE — Telephone Encounter (Signed)
Patients daughter has requested a medication refill for alprazolam for one week until follow up. Contact daughter [name] [phone]

## 2016-06-24 ENCOUNTER — Ambulatory Visit: Payer: Self-pay | Admitting: Family Medicine

## 2016-07-05 ENCOUNTER — Ambulatory Visit
Admission: RE | Admit: 2016-07-05 | Discharge: 2016-07-05 | Disposition: A | Discharge status: Home / Self Care | Payer: Medicare HMO | Source: Ambulatory Visit | Attending: Family Medicine | Admitting: Family Medicine

## 2016-07-05 ENCOUNTER — Ambulatory Visit (INDEPENDENT_AMBULATORY_CARE_PROVIDER_SITE_OTHER): Payer: Medicare HMO | Admitting: Family Medicine

## 2016-07-05 VITALS — BP 120/76 | HR 53 | Temp 99.1°F | Wt 240.2 lb

## 2016-07-05 DIAGNOSIS — W19XXXA Unspecified fall, initial encounter: Secondary | ICD-10-CM | POA: Diagnosis not present

## 2016-07-05 DIAGNOSIS — X58XXXA Exposure to other specified factors, initial encounter: Secondary | ICD-10-CM | POA: Diagnosis not present

## 2016-07-05 DIAGNOSIS — R930 Abnormal findings on diagnostic imaging of skull and head, not elsewhere classified: Secondary | ICD-10-CM | POA: Diagnosis not present

## 2016-07-05 DIAGNOSIS — S0990XA Unspecified injury of head, initial encounter: Secondary | ICD-10-CM | POA: Diagnosis not present

## 2016-07-05 DIAGNOSIS — R001 Bradycardia, unspecified: Secondary | ICD-10-CM | POA: Diagnosis not present

## 2016-07-05 MED ORDER — ALPRAZOLAM 0.25 MG PO TABS: ORAL_TABLET | ORAL | 1 refills | Status: DC

## 2016-07-05 MED ORDER — GLIMEPIRIDE 4 MG PO TABS: ORAL_TABLET | ORAL | 1 refills | Status: DC

## 2016-07-05 NOTE — Patient Instructions (Signed)
We will call regarding the referral to the EP doctor.  Change dressing daily.  We will call with the CT results.  Take care  Dr. Lacinda Axon

## 2016-07-07 ENCOUNTER — Telehealth: Payer: Self-pay | Admitting: Family Medicine

## 2016-07-07 DIAGNOSIS — W19XXXA Unspecified fall, initial encounter: Secondary | ICD-10-CM | POA: Insufficient documentation

## 2016-07-07 NOTE — Assessment & Plan Note (Signed)
New problem. Patient fell and hit his head. Suspect quite concerning given his age and comorbidity. As a result, CT head was obtained and was negative. Skin tear was dressed with Tegaderm.

## 2016-07-07 NOTE — Progress Notes (Signed)
Subjective:  Patient ID: Chase Simmons, male    DOB: 1933/12/07  Age: 83 y.o. MRN: [id number]  CC: Follow up, report recent fall  HPI:  83 year old with an extensive past medical history presents for follow-up. However, he states that he recently fell.  Fall  Patient fell yesterday after getting out of the shower. He fell forward and actually hit his head.  Suffered a skin tear on the right forearm.  Reports that he had a "knot" on the posterior aspect of his head.  I will was called. He was evaluated and did not go to the emergency room.  He states that he feels well. The skin tear needs to be dressed.  Normal mood and behavior per the son.  No reports of headaches.  No change in mental status.  No vision changes.  Bradycardia  Recently seen by cardiology. Had an event monitor.  It revealed A. fib, 6 episodes of bradycardia with no discernible P waves. Concern for junctional escape rhythm. He also had a 3 beat run of V. Tach.  EP consultation has been recommended if he were to pursue pacemaker placement. Patient is amendable. Pulse to see EP.  Social Hx   Social History   Social History  . Marital status: Legally Separated    Spouse name: N/A  . Number of children: 3  . Years of education: 12   Occupational History  . Retired Administrator    Social History Main Topics  . Smoking status: Former Smoker    Packs/day: 1.50    Years: 40.00  . Smokeless tobacco: Never Used  . Alcohol use 7.2 oz/week    12 Standard drinks or equivalent per week     Comment: 1 drink with dinner  . Drug use: No  . Sexual activity: No   Other Topics Concern  . Not on file   Social History Narrative   Patient grew up in [place], [place]. He is separated from his wife since 92. They never divorced. Patient has 1 son and 2 daughters. 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    Review of Systems  Respiratory: Negative.   Cardiovascular: Negative.   Neurological:  Positive for weakness.       Difficulty with ambulation.   Objective:  BP 120/76 (BP Location: Left Arm, Patient Position: Sitting, Cuff Size: Normal)   Pulse (!) 53   Temp 99.1 F (37.3 C) (Oral)   Wt 240 lb 4 oz (109 kg)   SpO2 96%   BMI 33.51 kg/m   BP/Weight 07/05/2016 04/13/2016 03/26/4008  Systolic BP 272 536 644  Diastolic BP 76 62 84  Wt. (Lbs) 240.25 240 285.38  BMI 33.51 33.47 39.8   Physical Exam  Constitutional: He appears well-developed. No distress.  Cardiovascular:  Bradycardic. Irregular.  Pulmonary/Chest: Effort normal and breath sounds normal. He has no wheezes. He has no rales.  Neurological: He is alert.  No apparent focal neurological deficit. Appears to be at his baseline.  Psychiatric: He has a normal mood and affect.  Vitals reviewed.   Lab Results  Component Value Date   WBC 7.7 01/05/2016   HGB 12.9 (L) 01/05/2016   HCT 38.5 (L) 01/05/2016   PLT 186.0 01/05/2016   GLUCOSE 182 (H) 07/27/2015   CHOL 111 01/05/2016   TRIG 67.0 01/05/2016   HDL 42.00 01/05/2016   LDLDIRECT 69.0 04/01/2014   LDLCALC 55 01/05/2016   ALT 18 07/27/2015   AST 23 07/27/2015  NA 139 07/27/2015   K 4.3 07/27/2015   CL 106 07/27/2015   CREATININE 1.10 07/27/2015   BUN 24 (H) 07/27/2015   CO2 25 07/27/2015   INR 1.11 07/27/2015   HGBA1C 7.3 (H) 01/05/2016   MICROALBUR 2.1 (H) 03/15/2016    Assessment & Plan:   Problem List Items Addressed This Visit      Other   Bradycardia    Concern for escape rhythm/AV block based on monitor. Sending to EP.      Relevant Orders   Ambulatory referral to Cardiac Electrophysiology   Fall - Primary    New problem. Patient fell and hit his head. Suspect quite concerning given his age and comorbidity. As a result, CT head was obtained and was negative. Skin tear was dressed with Tegaderm.      Relevant Orders   CT Head Wo Contrast (Completed)      Meds ordered this encounter  Medications  . glimepiride (AMARYL) 4  MG tablet    Sig: take 1 tablet by mouth WITH BREAKFAST.    Dispense:  90 tablet    Refill:  1  . ALPRAZolam (XANAX) 0.25 MG tablet    Sig: take 1 tablet by mouth every 12 hours if needed for anxiety    Dispense:  60 tablet    Refill:  1   Follow-up: 3 months  [place] DO [facility]

## 2016-07-07 NOTE — Telephone Encounter (Signed)
Pt son called back returning your call in regards to results. Please advise, thank you!  Call [place] @ [phone]

## 2016-07-07 NOTE — Assessment & Plan Note (Signed)
Concern for escape rhythm/AV block based on monitor. Sending to EP.

## 2016-07-07 NOTE — Telephone Encounter (Signed)
Left voice mail to call back 

## 2016-07-08 ENCOUNTER — Telehealth: Payer: Self-pay | Admitting: Family Medicine

## 2016-07-08 NOTE — Telephone Encounter (Signed)
Called patient back and gave results

## 2016-07-08 NOTE — Telephone Encounter (Signed)
Pt called wanting to get his CT results from 07/05/2016. Please advise?  Call pt @ [phone]. Thank you!

## 2016-07-08 NOTE — Telephone Encounter (Signed)
Called patient back and gave results of CT

## 2016-07-10 ENCOUNTER — Inpatient Hospital Stay
Admission: EM | Admit: 2016-07-10 | Discharge: 2016-07-13 | DRG: 287 | Disposition: A | Discharge status: Skilled Nursing Facility | Payer: Medicare HMO | Attending: Internal Medicine | Admitting: Internal Medicine

## 2016-07-10 ENCOUNTER — Emergency Department: Payer: Medicare HMO

## 2016-07-10 ENCOUNTER — Observation Stay (HOSPITAL_COMMUNITY)
Admit: 2016-07-10 | Discharge: 2016-07-10 | Disposition: A | Discharge status: Home / Self Care | Payer: Medicare HMO | Attending: Internal Medicine | Admitting: Internal Medicine

## 2016-07-10 DIAGNOSIS — I2510 Atherosclerotic heart disease of native coronary artery without angina pectoris: Secondary | ICD-10-CM | POA: Diagnosis not present

## 2016-07-10 DIAGNOSIS — R072 Precordial pain: Secondary | ICD-10-CM

## 2016-07-10 DIAGNOSIS — I480 Paroxysmal atrial fibrillation: Secondary | ICD-10-CM

## 2016-07-10 DIAGNOSIS — M6281 Muscle weakness (generalized): Secondary | ICD-10-CM | POA: Diagnosis not present

## 2016-07-10 DIAGNOSIS — R1111 Vomiting without nausea: Secondary | ICD-10-CM | POA: Diagnosis not present

## 2016-07-10 DIAGNOSIS — E785 Hyperlipidemia, unspecified: Secondary | ICD-10-CM | POA: Diagnosis present

## 2016-07-10 DIAGNOSIS — I214 Non-ST elevation (NSTEMI) myocardial infarction: Secondary | ICD-10-CM | POA: Diagnosis present

## 2016-07-10 DIAGNOSIS — R7989 Other specified abnormal findings of blood chemistry: Secondary | ICD-10-CM | POA: Diagnosis not present

## 2016-07-10 DIAGNOSIS — R531 Weakness: Secondary | ICD-10-CM

## 2016-07-10 DIAGNOSIS — E1165 Type 2 diabetes mellitus with hyperglycemia: Secondary | ICD-10-CM | POA: Diagnosis not present

## 2016-07-10 DIAGNOSIS — Z7984 Long term (current) use of oral hypoglycemic drugs: Secondary | ICD-10-CM | POA: Diagnosis not present

## 2016-07-10 DIAGNOSIS — E1151 Type 2 diabetes mellitus with diabetic peripheral angiopathy without gangrene: Secondary | ICD-10-CM | POA: Diagnosis not present

## 2016-07-10 DIAGNOSIS — E860 Dehydration: Secondary | ICD-10-CM | POA: Diagnosis present

## 2016-07-10 DIAGNOSIS — Z5189 Encounter for other specified aftercare: Secondary | ICD-10-CM | POA: Diagnosis not present

## 2016-07-10 DIAGNOSIS — Z9114 Patient's other noncompliance with medication regimen: Secondary | ICD-10-CM

## 2016-07-10 DIAGNOSIS — I255 Ischemic cardiomyopathy: Secondary | ICD-10-CM | POA: Diagnosis present

## 2016-07-10 DIAGNOSIS — R197 Diarrhea, unspecified: Secondary | ICD-10-CM | POA: Diagnosis not present

## 2016-07-10 DIAGNOSIS — Z7982 Long term (current) use of aspirin: Secondary | ICD-10-CM | POA: Diagnosis not present

## 2016-07-10 DIAGNOSIS — Z7951 Long term (current) use of inhaled steroids: Secondary | ICD-10-CM | POA: Diagnosis not present

## 2016-07-10 DIAGNOSIS — R112 Nausea with vomiting, unspecified: Secondary | ICD-10-CM

## 2016-07-10 DIAGNOSIS — R1110 Vomiting, unspecified: Secondary | ICD-10-CM | POA: Diagnosis not present

## 2016-07-10 DIAGNOSIS — I5021 Acute systolic (congestive) heart failure: Secondary | ICD-10-CM | POA: Diagnosis present

## 2016-07-10 DIAGNOSIS — R6889 Other general symptoms and signs: Secondary | ICD-10-CM | POA: Diagnosis not present

## 2016-07-10 DIAGNOSIS — Z87891 Personal history of nicotine dependence: Secondary | ICD-10-CM | POA: Diagnosis not present

## 2016-07-10 DIAGNOSIS — R778 Other specified abnormalities of plasma proteins: Secondary | ICD-10-CM

## 2016-07-10 DIAGNOSIS — R296 Repeated falls: Secondary | ICD-10-CM | POA: Diagnosis present

## 2016-07-10 DIAGNOSIS — E784 Other hyperlipidemia: Secondary | ICD-10-CM | POA: Diagnosis not present

## 2016-07-10 DIAGNOSIS — Z8551 Personal history of malignant neoplasm of bladder: Secondary | ICD-10-CM | POA: Diagnosis not present

## 2016-07-10 DIAGNOSIS — I10 Essential (primary) hypertension: Secondary | ICD-10-CM | POA: Diagnosis not present

## 2016-07-10 DIAGNOSIS — R748 Abnormal levels of other serum enzymes: Secondary | ICD-10-CM

## 2016-07-10 DIAGNOSIS — I493 Ventricular premature depolarization: Secondary | ICD-10-CM | POA: Diagnosis present

## 2016-07-10 DIAGNOSIS — K8689 Other specified diseases of pancreas: Secondary | ICD-10-CM | POA: Diagnosis not present

## 2016-07-10 DIAGNOSIS — I481 Persistent atrial fibrillation: Secondary | ICD-10-CM | POA: Diagnosis present

## 2016-07-10 DIAGNOSIS — E119 Type 2 diabetes mellitus without complications: Secondary | ICD-10-CM | POA: Diagnosis not present

## 2016-07-10 DIAGNOSIS — I420 Dilated cardiomyopathy: Secondary | ICD-10-CM | POA: Diagnosis not present

## 2016-07-10 DIAGNOSIS — I4819 Other persistent atrial fibrillation: Secondary | ICD-10-CM | POA: Diagnosis present

## 2016-07-10 DIAGNOSIS — Z79899 Other long term (current) drug therapy: Secondary | ICD-10-CM | POA: Diagnosis not present

## 2016-07-10 DIAGNOSIS — J449 Chronic obstructive pulmonary disease, unspecified: Secondary | ICD-10-CM | POA: Diagnosis not present

## 2016-07-10 DIAGNOSIS — I110 Hypertensive heart disease with heart failure: Secondary | ICD-10-CM | POA: Diagnosis not present

## 2016-07-10 DIAGNOSIS — Z7401 Bed confinement status: Secondary | ICD-10-CM | POA: Diagnosis not present

## 2016-07-10 LAB — CBC WITH DIFFERENTIAL/PLATELET
BASOS PCT: 1 %
Basophils Absolute: 0 10*3/uL (ref 0–0.1)
EOS ABS: 0 10*3/uL (ref 0–0.7)
EOS PCT: 1 %
HEMATOCRIT: 40.7 % (ref 40.0–52.0)
Hemoglobin: 14.1 g/dL (ref 13.0–18.0)
Lymphocytes Relative: 8 %
Lymphs Abs: 0.8 10*3/uL — ABNORMAL LOW (ref 1.0–3.6)
MCH: 35.1 pg — ABNORMAL HIGH (ref 26.0–34.0)
MCHC: 34.7 g/dL (ref 32.0–36.0)
MCV: 101.3 fL — ABNORMAL HIGH (ref 80.0–100.0)
MONO ABS: 0.4 10*3/uL (ref 0.2–1.0)
Monocytes Relative: 4 %
NEUTROS ABS: 8.5 10*3/uL — AB (ref 1.4–6.5)
Neutrophils Relative %: 86 %
PLATELETS: 178 10*3/uL (ref 150–440)
RBC: 4.02 MIL/uL — ABNORMAL LOW (ref 4.40–5.90)
RDW: 12.8 % (ref 11.5–14.5)
WBC: 9.7 10*3/uL (ref 3.8–10.6)

## 2016-07-10 LAB — HEPARIN LEVEL (UNFRACTIONATED): HEPARIN UNFRACTIONATED: 0.3 [IU]/mL (ref 0.30–0.70)

## 2016-07-10 LAB — COMPREHENSIVE METABOLIC PANEL
ALT: 22 U/L (ref 17–63)
ANION GAP: 10 (ref 5–15)
AST: 27 U/L (ref 15–41)
Albumin: 4 g/dL (ref 3.5–5.0)
Alkaline Phosphatase: 71 U/L (ref 38–126)
BUN: 27 mg/dL — ABNORMAL HIGH (ref 6–20)
CHLORIDE: 101 mmol/L (ref 101–111)
CO2: 29 mmol/L (ref 22–32)
Calcium: 9.4 mg/dL (ref 8.9–10.3)
Creatinine, Ser: 1.05 mg/dL (ref 0.61–1.24)
Glucose, Bld: 225 mg/dL — ABNORMAL HIGH (ref 65–99)
Potassium: 4.2 mmol/L (ref 3.5–5.1)
Sodium: 140 mmol/L (ref 135–145)
Total Bilirubin: 0.6 mg/dL (ref 0.3–1.2)
Total Protein: 7.4 g/dL (ref 6.5–8.1)

## 2016-07-10 LAB — CBC
HCT: 37.6 % — ABNORMAL LOW (ref 40.0–52.0)
HEMOGLOBIN: 13.3 g/dL (ref 13.0–18.0)
MCH: 35.1 pg — ABNORMAL HIGH (ref 26.0–34.0)
MCHC: 35.3 g/dL (ref 32.0–36.0)
MCV: 99.7 fL (ref 80.0–100.0)
Platelets: 180 10*3/uL (ref 150–440)
RBC: 3.77 MIL/uL — AB (ref 4.40–5.90)
RDW: 12.8 % (ref 11.5–14.5)
WBC: 9.8 10*3/uL (ref 3.8–10.6)

## 2016-07-10 LAB — URINALYSIS, ROUTINE W REFLEX MICROSCOPIC
BILIRUBIN URINE: NEGATIVE
Bacteria, UA: NONE SEEN
Hgb urine dipstick: NEGATIVE
KETONES UR: 5 mg/dL — AB
LEUKOCYTES UA: NEGATIVE
Nitrite: NEGATIVE
PH: 6 (ref 5.0–8.0)
Protein, ur: 100 mg/dL — AB
SPECIFIC GRAVITY, URINE: 1.014 (ref 1.005–1.030)

## 2016-07-10 LAB — TROPONIN I
TROPONIN I: 0.1 ng/mL — AB (ref <0.03)
TROPONIN I: 0.88 ng/mL — AB (ref <0.03)
TROPONIN I: 0.93 ng/mL — AB (ref <0.03)
Troponin I: 1.32 ng/mL (ref <0.03)

## 2016-07-10 LAB — GLUCOSE, CAPILLARY
GLUCOSE-CAPILLARY: 166 mg/dL — AB (ref 65–99)
Glucose-Capillary: 151 mg/dL — ABNORMAL HIGH (ref 65–99)
Glucose-Capillary: 146 mg/dL — ABNORMAL HIGH (ref 65–99)

## 2016-07-10 LAB — CREATININE, SERUM: Creatinine, Ser: 1.05 mg/dL (ref 0.61–1.24)

## 2016-07-10 LAB — PROTIME-INR
INR: 1.05
Prothrombin Time: 13.7 seconds (ref 11.4–15.2)

## 2016-07-10 LAB — APTT: APTT: 27 s (ref 24–36)

## 2016-07-10 LAB — LIPASE, BLOOD: LIPASE: 23 U/L (ref 11–51)

## 2016-07-10 LAB — MAGNESIUM: MAGNESIUM: 1.7 mg/dL (ref 1.7–2.4)

## 2016-07-10 MED ORDER — PAROXETINE HCL 20 MG PO TABS
40.0000 mg | ORAL_TABLET | Freq: Every day | ORAL | Status: DC
  Administered 2016-07-10 – 2016-07-13 (×4): 40 mg via ORAL
  Filled 2016-07-10 (×4): qty 2

## 2016-07-10 MED ORDER — ASPIRIN EC 325 MG PO TBEC: 325.0000 mg | DELAYED_RELEASE_TABLET | Freq: Every day | ORAL | Status: DC

## 2016-07-10 MED ORDER — METFORMIN HCL 500 MG PO TABS: 1000.0000 mg | ORAL_TABLET | Freq: Two times a day (BID) | ORAL | Status: DC

## 2016-07-10 MED ORDER — INSULIN ASPART 100 UNIT/ML ~~LOC~~ SOLN
0.0000 [IU] | Freq: Every day | SUBCUTANEOUS | Status: DC
  Administered 2016-07-11: 2 [IU] via SUBCUTANEOUS
  Filled 2016-07-10: qty 1

## 2016-07-10 MED ORDER — DIPHENHYDRAMINE-ZINC ACETATE 2-0.1 % EX CREA
TOPICAL_CREAM | Freq: Two times a day (BID) | CUTANEOUS | Status: DC | PRN
  Administered 2016-07-10: 13:00:00 via TOPICAL
  Filled 2016-07-10: qty 28

## 2016-07-10 MED ORDER — ATORVASTATIN CALCIUM 20 MG PO TABS
80.0000 mg | ORAL_TABLET | Freq: Every day | ORAL | Status: DC
  Administered 2016-07-10: 80 mg via ORAL
  Filled 2016-07-10: qty 4

## 2016-07-10 MED ORDER — ASPIRIN 81 MG PO CHEW: 162.0000 mg | CHEWABLE_TABLET | Freq: Once | ORAL | Status: DC

## 2016-07-10 MED ORDER — ALPRAZOLAM 0.25 MG PO TABS
0.2500 mg | ORAL_TABLET | Freq: Two times a day (BID) | ORAL | Status: DC | PRN
  Administered 2016-07-10 – 2016-07-12 (×3): 0.25 mg via ORAL
  Filled 2016-07-10 (×3): qty 1

## 2016-07-10 MED ORDER — TIOTROPIUM BROMIDE MONOHYDRATE 18 MCG IN CAPS
18.0000 ug | ORAL_CAPSULE | Freq: Every day | RESPIRATORY_TRACT | Status: DC
  Administered 2016-07-10 – 2016-07-13 (×4): 18 ug via RESPIRATORY_TRACT
  Filled 2016-07-10: qty 5

## 2016-07-10 MED ORDER — NITROGLYCERIN 0.4 MG SL SUBL
0.4000 mg | SUBLINGUAL_TABLET | SUBLINGUAL | Status: DC | PRN
Start: 2016-07-10 — End: 2016-07-13

## 2016-07-10 MED ORDER — HEPARIN BOLUS VIA INFUSION
4000.0000 [IU] | Freq: Once | INTRAVENOUS | Status: AC
  Administered 2016-07-10: 4000 [IU] via INTRAVENOUS
  Filled 2016-07-10: qty 4000

## 2016-07-10 MED ORDER — HEPARIN (PORCINE) IN NACL 100-0.45 UNIT/ML-% IJ SOLN
1150.0000 [IU]/h | INTRAMUSCULAR | Status: DC
  Administered 2016-07-10: 1150 [IU]/h via INTRAVENOUS
  Filled 2016-07-10: qty 3000

## 2016-07-10 MED ORDER — IOPAMIDOL (ISOVUE-300) INJECTION 61%
100.0000 mL | Freq: Once | INTRAVENOUS | Status: AC | PRN
  Administered 2016-07-10: 100 mL via INTRAVENOUS

## 2016-07-10 MED ORDER — IOPAMIDOL (ISOVUE-300) INJECTION 61%
30.0000 mL | Freq: Once | INTRAVENOUS | Status: AC
  Administered 2016-07-10: 30 mL via ORAL

## 2016-07-10 MED ORDER — ACETAMINOPHEN 325 MG PO TABS
650.0000 mg | ORAL_TABLET | ORAL | Status: DC | PRN
  Administered 2016-07-13: 650 mg via ORAL
  Filled 2016-07-10: qty 2

## 2016-07-10 MED ORDER — ASPIRIN EC 81 MG PO TBEC: 81.0000 mg | DELAYED_RELEASE_TABLET | Freq: Every day | ORAL | Status: DC

## 2016-07-10 MED ORDER — ONDANSETRON HCL 4 MG/2ML IJ SOLN: 4.0000 mg | Freq: Four times a day (QID) | INTRAMUSCULAR | Status: DC | PRN

## 2016-07-10 MED ORDER — LISINOPRIL 5 MG PO TABS
5.0000 mg | ORAL_TABLET | Freq: Every day | ORAL | Status: DC
  Administered 2016-07-10 – 2016-07-11 (×2): 5 mg via ORAL
  Filled 2016-07-10 (×2): qty 1

## 2016-07-10 MED ORDER — PRIMIDONE 50 MG PO TABS
75.0000 mg | ORAL_TABLET | Freq: Two times a day (BID) | ORAL | Status: DC
  Administered 2016-07-10 – 2016-07-13 (×7): 75 mg via ORAL
  Filled 2016-07-10 (×8): qty 1.5

## 2016-07-10 MED ORDER — ASPIRIN 81 MG PO CHEW
324.0000 mg | CHEWABLE_TABLET | ORAL | Status: AC
  Administered 2016-07-10: 324 mg via ORAL
  Filled 2016-07-10: qty 4

## 2016-07-10 MED ORDER — IPRATROPIUM-ALBUTEROL 0.5-2.5 (3) MG/3ML IN SOLN
3.0000 mL | Freq: Four times a day (QID) | RESPIRATORY_TRACT | Status: DC | PRN
  Administered 2016-07-10: 3 mL via RESPIRATORY_TRACT
  Filled 2016-07-10: qty 3

## 2016-07-10 MED ORDER — SODIUM CHLORIDE 0.9 % IV SOLN
INTRAVENOUS | Status: DC
  Administered 2016-07-10 – 2016-07-11 (×2): via INTRAVENOUS

## 2016-07-10 MED ORDER — INSULIN ASPART 100 UNIT/ML ~~LOC~~ SOLN
0.0000 [IU] | Freq: Three times a day (TID) | SUBCUTANEOUS | Status: DC
  Administered 2016-07-10 – 2016-07-11 (×4): 2 [IU] via SUBCUTANEOUS
  Administered 2016-07-12 (×2): 3 [IU] via SUBCUTANEOUS
  Administered 2016-07-12: 2 [IU] via SUBCUTANEOUS
  Administered 2016-07-13: 1 [IU] via SUBCUTANEOUS
  Filled 2016-07-10 (×8): qty 1

## 2016-07-10 MED ORDER — GLIMEPIRIDE 1 MG PO TABS: 1.0000 mg | ORAL_TABLET | Freq: Every day | ORAL | Status: DC

## 2016-07-10 MED ORDER — ENOXAPARIN SODIUM 120 MG/0.8ML ~~LOC~~ SOLN
1.0000 mg/kg | Freq: Two times a day (BID) | SUBCUTANEOUS | Status: DC
  Filled 2016-07-10 (×2): qty 0.8

## 2016-07-10 MED ORDER — ASPIRIN 81 MG PO CHEW
324.0000 mg | CHEWABLE_TABLET | Freq: Once | ORAL | Status: AC
  Administered 2016-07-10: 324 mg via ORAL
  Filled 2016-07-10: qty 4

## 2016-07-10 MED ORDER — ASPIRIN 300 MG RE SUPP: 300.0000 mg | RECTAL | Status: AC

## 2016-07-10 NOTE — ED Notes (Signed)
Patient transported to CT 

## 2016-07-10 NOTE — ED Provider Notes (Addendum)
[facility] Emergency Department Provider Note  ____________________________________________   First MD Initiated Contact with Patient 07/10/16 [phone]     (approximate)  I have reviewed the triage vital signs and the nursing notes.   HISTORY  Chief Complaint Emesis and Weakness    HPI Chase Simmons is a 83 y.o. male who presents by EMS for evaluation of acute onset vomiting.  He reports that he was in his normal state of health when he went to bed but then he woke up feeling sick to his stomach.  He had at least 4 episodes of vomiting that was nearly uncontrollable.  He denies having any abdominal pain, just the bloating and nausea which led to vomiting.  He tried sipping on some water in between episodes but was unable to keep anything down.  The onset was acute and intensity was severe.  Nothing in particular makes the patient's symptoms better nor worse.  +Generalized weakness.  He denies fever/chills, chest pain, shortness of breath, abdominal pain, dysuria.  He reports that he had one loose stool earlier in the day for which she took some Pepto-Bismol.     Past Medical History:  Diagnosis Date  . Allergy   . Atrial fibrillation ([place]) 08/30/2013  . Cancer [facility]) pt unsure of year   bladder cancer-Dr. Jacqlyn Larsen  . COPD (chronic obstructive pulmonary disease) ([place])   . Coronary atherosclerosis of native coronary artery 01/05/2016  . Hyperlipidemia   . Hypertension   . Tremor, essential    sees [facility] neurologist  . Type 2 diabetes mellitus with complication, without long-term current use of insulin ([place]) 08/01/2012  . Urinary incontinence     Patient Active Problem List   Diagnosis Date Noted  . Fall 07/07/2016  . COPD (chronic obstructive pulmonary disease) ([place]) 03/15/2016  . Coronary atherosclerosis of native coronary artery 01/05/2016  . Aortic atherosclerosis ([place]) 01/05/2016  . Bradycardia 01/05/2016  . Anxiety 06/17/2015  . Malignant neoplasm of  urinary bladder ([place]) 06/17/2015  . Allergic rhinitis 05/14/2015  . Knee pain, bilateral 03/16/2015  . Atrial fibrillation ([place]) 08/30/2013  . Acid reflux 08/30/2013  . Tremor, essential 10/22/2012  . Hyperlipidemia 10/22/2012  . HTN (hypertension) 08/01/2012  . Type 2 diabetes mellitus with complication, without long-term current use of insulin ([place]) 08/01/2012  . Benign prostatic hyperplasia with urinary obstruction 09/02/2011    Past Surgical History:  Procedure Laterality Date  . bladder cancer    . CATARACT EXTRACTION    . HERNIA REPAIR      Prior to Admission medications   Medication Sig Start Date End Date Taking? Authorizing Provider  acetaminophen (TYLENOL) 325 MG tablet Take 2 tablets (650 mg total) by mouth every 6 (six) hours as needed for mild pain (or Fever >/= 101). 06/22/15   Nicholes Mango, MD  ALPRAZolam Duanne Moron) 0.25 MG tablet take 1 tablet by mouth every 12 hours if needed for anxiety 07/05/16   Coral Spikes, DO  aspirin EC 81 MG tablet Take 1 tablet (81 mg total) by mouth daily. 03/15/16   Coral Spikes, DO  atorvastatin (LIPITOR) 80 MG tablet TAKE 1 TABLET ONE TIME DAILY 11/04/15   Coral Spikes, DO  fexofenadine (ALLEGRA) 180 MG tablet Take 180 mg by mouth daily as needed for allergies or rhinitis.    [provider]  glimepiride (AMARYL) 4 MG tablet take 1 tablet by mouth WITH BREAKFAST. 07/05/16   Thersa Salt G, DO  ipratropium-albuterol (DUONEB) 0.5-2.5 (3) MG/3ML  SOLN Take 3 mLs by nebulization every 6 (six) hours as needed. 06/08/15   Dustin Flock, MD  lisinopril (PRINIVIL,ZESTRIL) 5 MG tablet Take 1 tablet (5 mg total) by mouth daily. 03/15/16   Coral Spikes, DO  metFORMIN (GLUCOPHAGE) 1000 MG tablet TAKE 1 TABLET TWICE DAILY  WITH  FOOD 04/08/15   Rubbie Battiest, NP  ondansetron (ZOFRAN) 4 MG tablet Take 1 tablet (4 mg total) by mouth every 8 (eight) hours as needed for nausea or vomiting. 04/20/15   Daymon Larsen, MD  PARoxetine (PAXIL) 40 MG tablet take  1 tablet by mouth once daily 05/23/16   Coral Spikes, DO  primidone (MYSOLINE) 50 MG tablet Take 1.5 tablets (75 mg total) by mouth 2 (two) times daily. 01/29/16   Coral Spikes, DO  tiotropium (SPIRIVA) 18 MCG inhalation capsule Place 1 capsule (18 mcg total) into inhaler and inhale daily. 01/08/15   Rubbie Battiest, NP    Allergies Latex  Family History  Problem Relation Age of Onset  . Arthritis Mother   . Dementia Mother   . Cancer Maternal Uncle        prostate  . Cancer Maternal Aunt        breast cancer    Social History Social History  Substance Use Topics  . Smoking status: Former Smoker    Packs/day: 1.50    Years: 40.00  . Smokeless tobacco: Never Used  . Alcohol use 7.2 oz/week    12 Standard drinks or equivalent per week     Comment: 1 drink with dinner    Review of Systems Constitutional: No fever/chills Eyes: No visual changes. ENT: No sore throat. Cardiovascular: Denies chest pain. Respiratory: Denies shortness of breath. Gastrointestinal: Multiple episodes of vomiting.  No abdominal pain.  One loose stool earlier today. Genitourinary: Negative for dysuria. Musculoskeletal: Negative for neck pain.  Negative for back pain. Integumentary: Negative for rash. Neurological: Negative for headaches, focal weakness or numbness.   ____________________________________________   PHYSICAL EXAM:  VITAL SIGNS: ED Triage Vitals [07/10/16 0409]  Enc Vitals Group     BP (!) 162/103     Pulse      Resp      Temp 97.8 F (36.6 C)     Temp Source Oral     SpO2      Weight 108.9 kg (240 lb)     Height 1.803 m (5\' 11" )     Head Circumference      Peak Flow      Pain Score      Pain Loc      Pain Edu?      Excl. in [place]?     Constitutional: Alert and oriented. Generally well appearing and in no acute distress. Eyes: Conjunctivae are normal.  Head: Atraumatic. Nose: No congestion/rhinnorhea. Mouth/Throat: Mucous membranes are moist. Neck: No stridor.  No  meningeal signs.   Cardiovascular: Normal rate, regular rhythm. Good peripheral circulation. Grossly normal heart sounds. Respiratory: Normal respiratory effort.  No retractions. Lungs CTAB. Gastrointestinal: Soft and nontender. +distended, but this may be baseline Musculoskeletal: No lower extremity tenderness nor edema. No gross deformities of extremities. Neurologic:  Normal speech and language. No gross focal neurologic deficits are appreciated.  Skin:  Skin is warm, dry and intact. No rash noted. Psychiatric: Mood and affect are normal. Speech and behavior are normal.  ____________________________________________   LABS (all labs ordered are listed, but only abnormal results are displayed)  Labs Reviewed  CBC WITH DIFFERENTIAL/PLATELET - Abnormal; Notable for the following:       Result Value   RBC 4.02 (*)    MCV 101.3 (*)    MCH 35.1 (*)    Neutro Abs 8.5 (*)    Lymphs Abs 0.8 (*)    All other components within normal limits  COMPREHENSIVE METABOLIC PANEL - Abnormal; Notable for the following:    Glucose, Bld 225 (*)    BUN 27 (*)    All other components within normal limits  TROPONIN I - Abnormal; Notable for the following:    Troponin I 0.10 (*)    All other components within normal limits  URINALYSIS, ROUTINE W REFLEX MICROSCOPIC - Abnormal; Notable for the following:    Color, Urine YELLOW (*)    APPearance CLEAR (*)    Glucose, UA >=500 (*)    Ketones, ur 5 (*)    Protein, ur 100 (*)    Squamous Epithelial / LPF 0-5 (*)    All other components within normal limits  LIPASE, BLOOD  MAGNESIUM   ____________________________________________  EKG  ED ECG REPORT I, Benen Weida, the attending physician, personally viewed and interpreted this ECG.  Date: 07/10/2016 EKG Time: 4:13 AM Rate: 65 Rhythm: Atrial fibrillation with multiple PVCs QRS Axis: normal Intervals: LVH with IVCD ST/T Wave abnormalities: Non-specific ST segment / T-wave changes, but no  evidence of acute ischemia. Narrative Interpretation: no evidence of acute ischemia   ____________________________________________  RADIOLOGY   Ct Abdomen Pelvis W Contrast  Result Date: 07/10/2016 CLINICAL DATA:  Acute onset of diarrhea, nausea and vomiting. Initial encounter. EXAM: CT ABDOMEN AND PELVIS WITH CONTRAST TECHNIQUE: Multidetector CT imaging of the abdomen and pelvis was performed using the standard protocol following bolus administration of intravenous contrast. CONTRAST:  138mL ISOVUE-300 IOPAMIDOL (ISOVUE-300) INJECTION 61% COMPARISON:  CT of the abdomen and pelvis performed 06/21/2015, and MRCP performed 06/19/2015 FINDINGS: Lower chest: Scattered coronary artery calcifications are seen. Mild interstitial prominence is noted at the lung bases. Hepatobiliary: A nonspecific vague 1.0 cm hypodensity is noted at the inferior aspect of the right hepatic lobe. Small stones are seen dependently within the gallbladder. The gallbladder is otherwise unremarkable. The common bile duct remains normal in caliber. Pancreas: The pancreas is within normal limits. Spleen: The spleen is unremarkable in appearance. Adrenals/Urinary Tract: The adrenal glands are unremarkable in appearance. Nonspecific perinephric stranding is noted bilaterally. Mild bilateral renal pelvicaliectasis remains within normal limits. No renal or ureteral stones are identified. There is no evidence of hydronephrosis. Stomach/Bowel: The stomach is unremarkable in appearance. The small bowel is within normal limits. The appendix is normal in caliber, without evidence of appendicitis. Scattered diverticulosis is noted along the entirety of the colon, without evidence of diverticulitis. Vascular/Lymphatic: Scattered calcification is seen along the abdominal aorta and its branches. The abdominal aorta is otherwise grossly unremarkable. The inferior vena cava is grossly unremarkable. No retroperitoneal lymphadenopathy is seen. No pelvic  sidewall lymphadenopathy is identified. Reproductive: The bladder is mildly distended and grossly unremarkable. The prostate remains normal in size. Other: An anterior abdominal wall mesh is noted at the umbilicus, unremarkable in appearance. Musculoskeletal: No acute osseous abnormalities are identified. Multilevel vacuum phenomenon is noted at the upper lumbar spine. Facet disease is noted at the lower lumbar spine. The visualized musculature is unremarkable in appearance. IMPRESSION: 1. No acute abnormality seen to explain the patient's symptoms. 2. Cholelithiasis.  Gallbladder otherwise unremarkable. 3. Scattered diverticulosis along the entirety of the colon, without evidence  of diverticulitis. 4. Scattered aortic atherosclerosis. 5. Scattered coronary artery calcifications seen. 6. Mild nonspecific interstitial prominence at the lung bases. 7. Nonspecific vague 1.0 cm hypodensity at the inferior aspect of the right hepatic lobe is relatively stable from 2017 and likely benign. Electronically Signed   By: Garald Balding M.D.   On: 07/10/2016 06:32    ____________________________________________   PROCEDURES  Critical Care performed: No   Procedure(s) performed:   Procedures   ____________________________________________   INITIAL IMPRESSION / ASSESSMENT AND PLAN / ED COURSE  Pertinent labs & imaging results that were available during my care of the patient were reviewed by me and considered in my medical decision making (see chart for details).  H and is in no acute distress.  He reportedly has a history of bradycardia and is being considered for a pacemaker but his heart rate currently is acceptable and appropriate.  His vital signs are reassuring except for some hypertension which may be the result of his acute illness and arrival in the emergency department.  He states he feels much better and does not currently feel nauseated.  He points out that at no point has he had any abdominal  pain.  He also denies chest pain and shortness of breath.  He may have a viral gastroenteritis, but given his age and comorbidities I anticipate ruling out SBO/ileus with a CT scan if his creatinine is acceptable.  However given that he is asymptomatic at this time, if his lab work is reassuring, I may obtain plain radiographs to look for any evidence of obstruction and if they are normal we may try an oral intake challenge.  I will reassess after lab work.  He states he does not need any medicine at this time.   Clinical Course as of Jul 10 649  Sun Jul 10, 2016  0502 Elevated troponin at 0.1 without chest pain or SOB.  Kidney function is acceptable.  Elevated troponin likely represents demand ischemia and since the patient's primary 2 is persistent vomiting, I will evaluate with CT scan. Troponin I: (!!) 0.10 [CF]  0606 UA reassuring except elevated glucose.  [CF]  0636 CT abdomen and pelvis is unremarkable.  Given the patient's acute onset of symptoms and elevated troponin I will consult the hospitalist for admission for serial exams and serial enzymes.  [CF]  [phone] Giving full dose aspirin given elevated troponin but will not treat with heparin.  [CF]  [phone] Discussed with Dr. Estanislado Pandy who will admit.  [CF]    Clinical Course User Index [CF] Hinda Kehr, MD    ____________________________________________  FINAL CLINICAL IMPRESSION(S) / ED DIAGNOSES  Final diagnoses:  Non-intractable vomiting with nausea, unspecified vomiting type  Elevated troponin I level  Generalized weakness     MEDICATIONS GIVEN DURING THIS VISIT:  Medications  aspirin chewable tablet 324 mg (not administered)  iopamidol (ISOVUE-300) 61 % injection 30 mL (30 mLs Oral Contrast Given 07/10/16 0520)  iopamidol (ISOVUE-300) 61 % injection 100 mL (100 mLs Intravenous Contrast Given 07/10/16 0557)     NEW OUTPATIENT MEDICATIONS STARTED DURING THIS VISIT:  New Prescriptions   No medications on file    Modified  Medications   No medications on file    Discontinued Medications   No medications on file     Note:  This document was prepared using Dragon voice recognition software and may include unintentional dictation errors.    Hinda Kehr, MD 07/10/16 1224    Hinda Kehr, MD 07/10/16 [phone]

## 2016-07-10 NOTE — ED Notes (Signed)
Family at bedside. 

## 2016-07-10 NOTE — Progress Notes (Addendum)
ANTICOAGULATION CONSULT NOTE - Initial Consult  Pharmacy Consult for UFH Indication: chest pain/ACS  Allergies  Allergen Reactions  . Latex Itching    Patient Measurements: Height: 5\' 11"  (180.3 cm) Weight: 240 lb (108.9 kg) IBW/kg (Calculated) : 75.3   Vital Signs: Temp: 98.1 F (36.7 C) (07/08 0844) Temp Source: Oral (07/08 0844) BP: 121/73 (07/08 0844) Pulse Rate: 76 (07/08 0844)  Labs:  Recent Labs  07/10/16 0416  HGB 14.1  HCT 40.7  PLT 178  CREATININE 1.05  TROPONINI 0.10*    Estimated Creatinine Clearance: 69.2 mL/min (by C-G formula based on SCr of 1.05 mg/dL).   Medical History: Past Medical History:  Diagnosis Date  . Allergy   . Atrial fibrillation ([place]) 08/30/2013  . Cancer [facility]) pt unsure of year   bladder cancer-Dr. Jacqlyn Larsen  . COPD (chronic obstructive pulmonary disease) ([place])   . Coronary atherosclerosis of native coronary artery 01/05/2016  . Hyperlipidemia   . Hypertension   . Tremor, essential    sees [facility] neurologist  . Type 2 diabetes mellitus with complication, without long-term current use of insulin ([place]) 08/01/2012  . Urinary incontinence      Assessment: 83 yo male here for r/o ACS to start UFH  Hgb 14.1, plt 178  Goal of Therapy:  Heparin level 0.3-0.7 units/ml Anti-Xa level 0.6-1 units/ml 4hrs after LMWH dose given Monitor platelets by anticoagulation protocol: Yes   Plan:  Give 4000 units bolus x 1 Start heparin infusion at 1150 units/hr Check anti-Xa level in 8 hours and daily while on heparin Continue to monitor H&H and platelets  Pharmacy will continue to follow.  Laural Benes, Pharm.D., BCPS Clinical Pharmacist 07/10/2016,9:46 AM

## 2016-07-10 NOTE — H&P (Addendum)
[facility] [place] NAME: Chase Simmons    MR#:  [id number]  DATE OF BIRTH:  10-27-1933  DATE OF ADMISSION:  07/10/2016  PRIMARY CARE PHYSICIAN: Coral Spikes, DO   REQUESTING/REFERRING PHYSICIAN: Hinda Kehr, MD  CHIEF COMPLAINT:   Chief Complaint  Patient presents with  . Emesis  . Weakness   Intractable nausea and vomiting, as well as generalized weakness this morning HISTORY OF PRESENT ILLNESS:  Chase Simmons  is a 83 y.o. male with a known history of Hypertension, diabetes, CAD, COPD, hyperlipidemia and bladder cancer. The patient was in his normal status when he went to bed but woke up feeling sick to his stomach. He has had at least 4 episodes of intractable nausea and vomiting. The patient denies any abdominal pain, chest pain, palpitation or diaphoresis. He denies any fever, chills, dysuria or hematuria. He feels generalized weakness. He was found elevated troponin in the ED. He said he has history of bradycardia and was told by cardiologist that he may need a pacemaker. PAST MEDICAL HISTORY:   Past Medical History:  Diagnosis Date  . Allergy   . Atrial fibrillation ([address]) 08/30/2013  . Cancer [facility]) pt unsure of year   bladder cancer-Dr. Jacqlyn Larsen  . COPD (chronic obstructive pulmonary disease) ([place])   . Coronary atherosclerosis of native coronary artery 01/05/2016  . Hyperlipidemia   . Hypertension   . Tremor, essential    sees [facility] neurologist  . Type 2 diabetes mellitus with complication, without long-term current use of insulin ([place]) 08/01/2012  . Urinary incontinence     PAST SURGICAL HISTORY:   Past Surgical History:  Procedure Laterality Date  . bladder cancer    . CATARACT EXTRACTION    . HERNIA REPAIR      SOCIAL HISTORY:   Social History  Substance Use Topics  . Smoking status: Former Smoker    Packs/day: 1.50    Years: 40.00  . Smokeless tobacco: Never Used  . Alcohol use 7.2 oz/week    12 Standard  drinks or equivalent per week     Comment: 1 drink with dinner    FAMILY HISTORY:   Family History  Problem Relation Age of Onset  . Arthritis Mother   . Dementia Mother   . Cancer Maternal Uncle        prostate  . Cancer Maternal Aunt        breast cancer    DRUG ALLERGIES:   Allergies  Allergen Reactions  . Latex Itching    REVIEW OF SYSTEMS:   Review of Systems  Constitutional: Positive for malaise/fatigue. Negative for chills and fever.  HENT: Negative for sore throat.   Eyes: Negative for blurred vision and double vision.  Respiratory: Negative for cough, shortness of breath, wheezing and stridor.   Cardiovascular: Negative for chest pain, palpitations and leg swelling.  Gastrointestinal: Positive for nausea and vomiting. Negative for abdominal pain, blood in stool, diarrhea and melena.  Genitourinary: Negative for dysuria, hematuria and urgency.  Musculoskeletal: Negative for back pain.  Skin: Negative for itching and rash.  Neurological: Positive for weakness. Negative for dizziness, focal weakness, loss of consciousness and headaches.  Endo/Heme/Allergies: Negative for polydipsia.  Psychiatric/Behavioral: Negative for depression. The patient is not nervous/anxious.     MEDICATIONS AT HOME:   Prior to Admission medications   Medication Sig Start Date End Date Taking? Authorizing Provider  acetaminophen (TYLENOL) 325 MG tablet Take 2 tablets (650 mg total)  by mouth every 6 (six) hours as needed for mild pain (or Fever >/= 101). 06/22/15   Nicholes Mango, MD  ALPRAZolam Duanne Moron) 0.25 MG tablet take 1 tablet by mouth every 12 hours if needed for anxiety 07/05/16   Coral Spikes, DO  aspirin EC 81 MG tablet Take 1 tablet (81 mg total) by mouth daily. 03/15/16   Coral Spikes, DO  atorvastatin (LIPITOR) 80 MG tablet TAKE 1 TABLET ONE TIME DAILY 11/04/15   Coral Spikes, DO  fexofenadine (ALLEGRA) 180 MG tablet Take 180 mg by mouth daily as needed for allergies or rhinitis.     [provider]  glimepiride (AMARYL) 4 MG tablet take 1 tablet by mouth WITH BREAKFAST. 07/05/16   Thersa Salt G, DO  ipratropium-albuterol (DUONEB) 0.5-2.5 (3) MG/3ML SOLN Take 3 mLs by nebulization every 6 (six) hours as needed. 06/08/15   Dustin Flock, MD  lisinopril (PRINIVIL,ZESTRIL) 5 MG tablet Take 1 tablet (5 mg total) by mouth daily. 03/15/16   Coral Spikes, DO  metFORMIN (GLUCOPHAGE) 1000 MG tablet TAKE 1 TABLET TWICE DAILY  WITH  FOOD 04/08/15   Rubbie Battiest, NP  ondansetron (ZOFRAN) 4 MG tablet Take 1 tablet (4 mg total) by mouth every 8 (eight) hours as needed for nausea or vomiting. 04/20/15   Daymon Larsen, MD  PARoxetine (PAXIL) 40 MG tablet take 1 tablet by mouth once daily 05/23/16   Coral Spikes, DO  primidone (MYSOLINE) 50 MG tablet Take 1.5 tablets (75 mg total) by mouth 2 (two) times daily. 01/29/16   Coral Spikes, DO  tiotropium (SPIRIVA) 18 MCG inhalation capsule Place 1 capsule (18 mcg total) into inhaler and inhale daily. 01/08/15   Rubbie Battiest, NP      VITAL SIGNS:  Blood pressure (!) 143/71, pulse 75, temperature 97.8 F (36.6 C), temperature source Oral, resp. rate 13, height 5\' 11"  (1.803 m), weight 240 lb (108.9 kg), SpO2 97 %.  PHYSICAL EXAMINATION:  Physical Exam  GENERAL:  83 y.o.-year-old patient lying in the bed with no acute distress.  EYES: Pupils equal, round, reactive to light and accommodation. No scleral icterus. Extraocular muscles intact.  HEENT: Head atraumatic, normocephalic. Oropharynx and nasopharynx clear.  NECK:  Supple, no jugular venous distention. No thyroid enlargement, no tenderness.  LUNGS: Normal breath sounds bilaterally, no wheezing, rales,rhonchi or crepitation. No use of accessory muscles of respiration.  CARDIOVASCULAR: S1, S2 normal. No murmurs, rubs, or gallops.  ABDOMEN: Soft, nontender, nondistended. Bowel sounds present. No organomegaly or mass.  EXTREMITIES: Trace leg edema, no cyanosis, or clubbing.    NEUROLOGIC: Cranial nerves II through XII are intact. Muscle strength 5/5 in all extremities. Sensation intact. Gait not checked.  PSYCHIATRIC: The patient is alert and oriented x 3.  SKIN: No obvious rash, lesion, or ulcer.   LABORATORY PANEL:   CBC  Recent Labs Lab 07/10/16 0416  WBC 9.7  HGB 14.1  HCT 40.7  PLT 178   ------------------------------------------------------------------------------------------------------------------  Chemistries   Recent Labs Lab 07/10/16 0416  NA 140  K 4.2  CL 101  CO2 29  GLUCOSE 225*  BUN 27*  CREATININE 1.05  CALCIUM 9.4  MG 1.7  AST 27  ALT 22  ALKPHOS 71  BILITOT 0.6   ------------------------------------------------------------------------------------------------------------------  Cardiac Enzymes  Recent Labs Lab 07/10/16 0416  TROPONINI 0.10*   ------------------------------------------------------------------------------------------------------------------  RADIOLOGY:  Ct Abdomen Pelvis W Contrast  Result Date: 07/10/2016 CLINICAL DATA:  Acute onset of diarrhea, nausea  and vomiting. Initial encounter. EXAM: CT ABDOMEN AND PELVIS WITH CONTRAST TECHNIQUE: Multidetector CT imaging of the abdomen and pelvis was performed using the standard protocol following bolus administration of intravenous contrast. CONTRAST:  146mL ISOVUE-300 IOPAMIDOL (ISOVUE-300) INJECTION 61% COMPARISON:  CT of the abdomen and pelvis performed 06/21/2015, and MRCP performed 06/19/2015 FINDINGS: Lower chest: Scattered coronary artery calcifications are seen. Mild interstitial prominence is noted at the lung bases. Hepatobiliary: A nonspecific vague 1.0 cm hypodensity is noted at the inferior aspect of the right hepatic lobe. Small stones are seen dependently within the gallbladder. The gallbladder is otherwise unremarkable. The common bile duct remains normal in caliber. Pancreas: The pancreas is within normal limits. Spleen: The spleen is  unremarkable in appearance. Adrenals/Urinary Tract: The adrenal glands are unremarkable in appearance. Nonspecific perinephric stranding is noted bilaterally. Mild bilateral renal pelvicaliectasis remains within normal limits. No renal or ureteral stones are identified. There is no evidence of hydronephrosis. Stomach/Bowel: The stomach is unremarkable in appearance. The small bowel is within normal limits. The appendix is normal in caliber, without evidence of appendicitis. Scattered diverticulosis is noted along the entirety of the colon, without evidence of diverticulitis. Vascular/Lymphatic: Scattered calcification is seen along the abdominal aorta and its branches. The abdominal aorta is otherwise grossly unremarkable. The inferior vena cava is grossly unremarkable. No retroperitoneal lymphadenopathy is seen. No pelvic sidewall lymphadenopathy is identified. Reproductive: The bladder is mildly distended and grossly unremarkable. The prostate remains normal in size. Other: An anterior abdominal wall mesh is noted at the umbilicus, unremarkable in appearance. Musculoskeletal: No acute osseous abnormalities are identified. Multilevel vacuum phenomenon is noted at the upper lumbar spine. Facet disease is noted at the lower lumbar spine. The visualized musculature is unremarkable in appearance. IMPRESSION: 1. No acute abnormality seen to explain the patient's symptoms. 2. Cholelithiasis.  Gallbladder otherwise unremarkable. 3. Scattered diverticulosis along the entirety of the colon, without evidence of diverticulitis. 4. Scattered aortic atherosclerosis. 5. Scattered coronary artery calcifications seen. 6. Mild nonspecific interstitial prominence at the lung bases. 7. Nonspecific vague 1.0 cm hypodensity at the inferior aspect of the right hepatic lobe is relatively stable from 2017 and likely benign. Electronically Signed   By: Garald Balding M.D.   On: 07/10/2016 06:32      IMPRESSION AND PLAN:   NSTEMI   Troponin is trending up, 0.10-> 0.88. Admit to tele. Given aspirin 325 mg by mouth, start heparin drip PTD. Continue Lipitor and lisinopril, follow-up troponin level, Echo and and cardiology consult.  Intractable nausea and vomiting. zofran prn. IVF support.  Dehydration. Start normal saline IV and follow-up BMP. Hypertension. Continue home hypertension medication. Diabetes. Hold metformin and glimepiride, start sliding scale. COPD. Stable. NEB when necessary.  Discussed with cardiology PA. All the records are reviewed and case discussed with ED provider. Management plans discussed with the patient, family and they are in agreement.  CODE STATUS: Full code  TOTAL TIME TAKING CARE OF THIS PATIENT: 58 minutes.    [name] M.D on 07/10/2016 at 7:22 AM  Between 7am to 6pm - Pager - [phone]  After 6pm go to [email or web address] - Proofreader  [facility]  Office  [phone]  CC: Primary care physician; Coral Spikes, DO   Note: This dictation was prepared with Dragon dictation along with smaller phrase technology. Any transcriptional errors that result from this process are unintentional.

## 2016-07-10 NOTE — Progress Notes (Signed)
Advanced Care Plan.  Purpose of Encounter: CODE STATUS. Parties in Attendance: The patient, RN and me. Patient's Decisional Capacity: yes. Medical Story: Chase Simmons  is a 83 y.o. male with a known history of Hypertension, diabetes, CAD, COPD, hyperlipidemia and bladder cancer. He is admitted for NSTEMI and started heparin drip. I discussed with the patient about his condition and CODE STATUS. He said he wants to see grandchildren growing up and wants full code for now.  Goals of Care Determinations: Full code for now.  Plan:  Code Status: Full code Time spent discussing advance care planning: 17 minutes.

## 2016-07-10 NOTE — Consult Note (Signed)
Cardiology Consultation Note  Patient ID: Chase Simmons, MRN: [id number], DOB/AGE: 1933/06/04 83 y.o. Admit date: 07/10/2016   Date of Consult: 07/10/2016 Primary Physician: Coral Spikes, DO Primary Cardiologist: Formerly Dr. Yvone Neu, MD Requesting Physician: Dr. Bridgett Larsson, MD  Chief Complaint: Nausea, vomiting, diarrhea  Reason for Consult: Elevated troponin   HPI: Chase Simmons is a 83 y.o. male who is being seen today for the evaluation of elevated troponin at the request of Dr. Bridgett Larsson, MD. Patient has a h/o persistent Afib not on full dose anticoagulation given medication noncompliance and recent falls. Also with a history of asymptomatic bradycardia with possible AV dissociation, HTN, and HLD who presented to [facility] with nausea, vomiting, and diarrhea.   Patient was previously followed by Dr. Saralyn Pilar though recently re-established with Dr. Yvone Neu for evaluation of Afib. Has not been on anticoagulation as above. Heart rates were noted to be bradycardic prompting long term event monitor that showed an overall bradycardic rate with possible AV dissociation. EP referral was considered, though patient was not certain he would even want an invasive procedure. Prior echo from 06/2015 showed a mildly reduced LV systolic function at 29% with GR1DD.   Patient noted 3 episodes of profuse watery diarrhea throughout the day on 7/6 followed by 4 episodes of emesis after eating dinner. Never with chest pain, SOB, palpitations, presyncope, or syncope. Some associated dizziness. He called EMS for medication and was brought to [facility].   Upon the patient's arrival to [facility] they were found to have BP 162/103, HR 66 bpm, temp 98.7, oxygen saturation 100% on room air, weight 240 pounds. EKG not acute as below, CT abdomen and pelvis showed no acute abnormality, gallstones, diverticulitis, scattered aortic atherosclerosis, scattered coronary artery calcifications. Labs showed troponin 0.10, SCr 1.05, K+ 4.2, normal liver  function, lipase 23, WBC 9.7, HGB 14.1, PLT 178, Mg++ 1.7. He was given ASA 325 mg x 1 in the ED. His nausea, vomiting, and diarrhea have improved. Never with chest pain or SOB. Some dizziness. No presyncope or syncope.   Past Medical History:  Diagnosis Date  . Allergy   . Atrial fibrillation ([place]) 08/30/2013  . Cancer [facility]) pt unsure of year   bladder cancer-Dr. Jacqlyn Larsen  . COPD (chronic obstructive pulmonary disease) ([place])   . Coronary atherosclerosis of native coronary artery 01/05/2016  . Hyperlipidemia   . Hypertension   . Tremor, essential    sees [facility] neurologist  . Type 2 diabetes mellitus with complication, without long-term current use of insulin ([place]) 08/01/2012  . Urinary incontinence       Most Recent Cardiac Studies: TTE 06/2015: Study Conclusions  - Procedure narrative: Transthoracic echocardiography. Image   quality was poor. The study was technically difficult, as a   result of poor sound wave transmission. - Left ventricle: The cavity size was normal. Systolic function was   normal. The estimated ejection fraction was 50%. Doppler   parameters are consistent with abnormal left ventricular   relaxation (grade 1 diastolic dysfunction). - Aortic valve: Valve area (Vmax): 2.31 cm^2. - Right atrium: The atrium was mildly dilated.  Impressions:  - Normal left ventricle systolic function with mild diastolic   dysfunction.  Long term cardiac monitoring 05/2016: Study Highlights   Long-term monitor, 2 days 21 hours  Overall underlying rhythm was atrial fibrillation, 85% AF burden. The heart rate ranged from 35 to 130 bpm, average of 54 BPM. Sinus rate 35-67, average of 54 BPM  There were several episodes, possibly 6  episodes, typically early morning, where there was a note of a regular rhythm with no discernible P waves for < 20beats. There is concern for possible AV block with a junctional escape, with heart rates in the 40s to 50s. 04/14/2016 10:42 AM, 04/14/2016,  3:38 AM, 04/15/2016 7:59 AM, 04/15/2016 8:33 AM, 04/15/2016 10:49 AM, 11:58 AM, and 04/16/2016 12:55 AM.  One episode of possible 3 beat ventricular tachycardia at a rate of 1:30 bpm was noted. No symptoms documented.     Surgical History:  Past Surgical History:  Procedure Laterality Date  . bladder cancer    . CATARACT EXTRACTION    . HERNIA REPAIR       Home Meds: Prior to Admission medications   Medication Sig Start Date End Date Taking? Authorizing Provider  acetaminophen (TYLENOL) 325 MG tablet Take 2 tablets (650 mg total) by mouth every 6 (six) hours as needed for mild pain (or Fever >/= 101). 06/22/15  Yes Gouru, Illene Silver, MD  ALPRAZolam Duanne Moron) 0.25 MG tablet take 1 tablet by mouth every 12 hours if needed for anxiety 07/05/16   Coral Spikes, DO  aspirin EC 81 MG tablet Take 1 tablet (81 mg total) by mouth daily. 03/15/16   Coral Spikes, DO  atorvastatin (LIPITOR) 80 MG tablet TAKE 1 TABLET ONE TIME DAILY 11/04/15   Coral Spikes, DO  fexofenadine (ALLEGRA) 180 MG tablet Take 180 mg by mouth daily as needed for allergies or rhinitis.    [provider]  glimepiride (AMARYL) 4 MG tablet take 1 tablet by mouth WITH BREAKFAST. 07/05/16   Thersa Salt G, DO  ipratropium-albuterol (DUONEB) 0.5-2.5 (3) MG/3ML SOLN Take 3 mLs by nebulization every 6 (six) hours as needed. 06/08/15   Dustin Flock, MD  lisinopril (PRINIVIL,ZESTRIL) 5 MG tablet Take 1 tablet (5 mg total) by mouth daily. 03/15/16   Coral Spikes, DO  metFORMIN (GLUCOPHAGE) 1000 MG tablet TAKE 1 TABLET TWICE DAILY  WITH  FOOD 04/08/15   Rubbie Battiest, NP  ondansetron (ZOFRAN) 4 MG tablet Take 1 tablet (4 mg total) by mouth every 8 (eight) hours as needed for nausea or vomiting. 04/20/15   Daymon Larsen, MD  PARoxetine (PAXIL) 40 MG tablet take 1 tablet by mouth once daily 05/23/16   Coral Spikes, DO  primidone (MYSOLINE) 50 MG tablet Take 1.5 tablets (75 mg total) by mouth 2 (two) times daily. 01/29/16   Coral Spikes, DO    tiotropium (SPIRIVA) 18 MCG inhalation capsule Place 1 capsule (18 mcg total) into inhaler and inhale daily. 01/08/15   Rubbie Battiest, NP    Inpatient Medications:     Allergies:  Allergies  Allergen Reactions  . Latex Itching    Social History   Social History  . Marital status: Legally Separated    Spouse name: N/A  . Number of children: 3  . Years of education: 12   Occupational History  . Retired Administrator    Social History Main Topics  . Smoking status: Former Smoker    Packs/day: 1.50    Years: 40.00  . Smokeless tobacco: Never Used  . Alcohol use 7.2 oz/week    12 Standard drinks or equivalent per week     Comment: 1 drink with dinner  . Drug use: No  . Sexual activity: No   Other Topics Concern  . Not on file   Social History Narrative   Patient grew up in [place], [place]. He is separated  from his wife since 62. They never divorced. Patient has 1 son and 2 daughters. 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     Family History  Problem Relation Age of Onset  . Arthritis Mother   . Dementia Mother   . Cancer Maternal Uncle        prostate  . Cancer Maternal Aunt        breast cancer     Review of Systems: Review of Systems  Constitutional: Positive for malaise/fatigue. Negative for chills, diaphoresis, fever and weight loss.  HENT: Negative for congestion.   Eyes: Negative for discharge and redness.  Respiratory: Negative for cough, hemoptysis, sputum production, shortness of breath and wheezing.   Cardiovascular: Negative for chest pain, palpitations, orthopnea, claudication, leg swelling and PND.  Gastrointestinal: Positive for diarrhea, nausea and vomiting. Negative for abdominal pain, blood in stool, constipation, heartburn and melena.  Genitourinary: Negative for hematuria.  Musculoskeletal: Negative for falls and myalgias.  Skin: Negative for rash.  Neurological: Negative for dizziness, tingling, tremors, sensory change, speech  change, focal weakness, loss of consciousness and weakness.  Endo/Heme/Allergies: Does not bruise/bleed easily.  Psychiatric/Behavioral: Negative for substance abuse. The patient is not nervous/anxious.   All other systems reviewed and are negative.   Labs:  Recent Labs  07/10/16 0416  TROPONINI 0.10*   Lab Results  Component Value Date   WBC 9.7 07/10/2016   HGB 14.1 07/10/2016   HCT 40.7 07/10/2016   MCV 101.3 (H) 07/10/2016   PLT 178 07/10/2016     Recent Labs Lab 07/10/16 0416  NA 140  K 4.2  CL 101  CO2 29  BUN 27*  CREATININE 1.05  CALCIUM 9.4  PROT 7.4  BILITOT 0.6  ALKPHOS 71  ALT 22  AST 27  GLUCOSE 225*   Lab Results  Component Value Date   CHOL 111 01/05/2016   HDL 42.00 01/05/2016   LDLCALC 55 01/05/2016   TRIG 67.0 01/05/2016   No results found for: DDIMER  Radiology/Studies:  Ct Head Wo Contrast  Result Date: 07/05/2016 IMPRESSION: 1. No acute intracranial abnormality or acute traumatic injury identified. 2. Chronic white matter changes compatible with small vessel disease. These results will be called to the ordering clinician or representative by the Radiology Department at the imaging location. Electronically Signed   By: Genevie Ann M.D.   On: 07/05/2016 16:26   Ct Abdomen Pelvis W Contrast  Result Date: 07/10/2016 IMPRESSION: 1. No acute abnormality seen to explain the patient's symptoms. 2. Cholelithiasis.  Gallbladder otherwise unremarkable. 3. Scattered diverticulosis along the entirety of the colon, without evidence of diverticulitis. 4. Scattered aortic atherosclerosis. 5. Scattered coronary artery calcifications seen. 6. Mild nonspecific interstitial prominence at the lung bases. 7. Nonspecific vague 1.0 cm hypodensity at the inferior aspect of the right hepatic lobe is relatively stable from 2017 and likely benign. Electronically Signed   By: Garald Balding M.D.   On: 07/10/2016 06:32    EKG: Interpreted by me showed: NSR, 65 bpm,  occasional PVC, baseline wandering  Telemetry: Interpreted by me showed: NSR, 70s bpm, occasional PVCS/PACs  Weights: Filed Weights   07/10/16 0409  Weight: 240 lb (108.9 kg)     Physical Exam: Blood pressure 121/73, pulse 76, temperature 98.1 F (36.7 C), temperature source Oral, resp. rate 18, height 5\' 11"  (1.803 m), weight 240 lb (108.9 kg), SpO2 99 %. Body mass index is 33.47 kg/m. General: Well developed, well nourished, in no acute distress.  Head: Normocephalic, atraumatic, sclera non-icteric, no xanthomas, nares are without discharge.  Neck: Negative for carotid bruits. JVD not elevated. Lungs: Clear bilaterally to auscultation without wheezes, rales, or rhonchi. Breathing is unlabored. Heart: RRR with S1 S2. No murmurs, rubs, or gallops appreciated. Abdomen: Soft, non-tender, non-distended with normoactive bowel sounds. No hepatomegaly. No rebound/guarding. No obvious abdominal masses. Msk:  Strength and tone appear normal for age. Extremities: No clubbing or cyanosis. No edema. Distal pedal pulses are 2+ and equal bilaterally. Neuro: Alert and oriented X 3. No facial asymmetry. No focal deficit. Moves all extremities spontaneously. Psych:  Responds to questions appropriately with a normal affect.    Assessment and Plan:  Principal Problem:   Nausea & vomiting Active Problems:   Elevated troponin   Diarrhea   Persistent atrial fibrillation ([place])    1. Elevated troponin: -Mildly elevated at 0.10, continue to trend until peak -Never with chest pain -Possibly 2/2 supply demand ischemia in the setting of GI illness with nausea, vomiting, and diarrhea -Check TTE -No plans for invasive work up unless there is dynamic troponin elevation or echo changes c/w ischemia -Would start Lovenox/heparin if there is dynamic troponin elevation  2. Persistent Afib: -Currently in sinus rhythm with heart rate in the 70s bpm -Not on full dose anticoagulation at home given medication  compliance and history of recent falls -CHADS2VASc at least 4 (HTN, age x 2, DM) -Not on rate control given prior issues with bradycardic rates with possible AV dissociation   3. Bradycardia: -None noted currently -Has previously worn long term outpatient cardiac monitoring that showed bradycardic rates with possible AV dissociation -Has previously been advised to follow up with EP, though he has been uncertian if he would even want invasive procedure -For now, no acute indication for PPM -Monitor on telemetry  4. GI upset with nausea/vomiting/diarrhea: -IV fluids and supportive care per IM  5. LV dysfunction: -Prior echo from 06/2015 with mild reduction in EF at 50% -He does not appear to be grossly volume overloaded at this time -TTE pending -Not on BB given history of bradycardia with possible AV dissociation    Signed, Christell Faith, PA-C [place] Pager: [phone] 07/10/2016, 9:31 AM   I have personally seen and examined this patient with Christell Faith, PA-C. I agree with the assessment and plan as outlined above. Briefly, Mr. Blanchfield is a 83 yo male with history of HTN, HLD, bradycardia, persistent atrial fibrillation and DM admitted with nausea/vomiting/diarrhea. Troponin mildly elevated at 0.10. No chest pain or dyspnea. Cardiology consulted given abnormal troponin.  Telemetry reviewed by me shows sinus, rate 70s, PVCs, PACs EKG reviewed by me shows probable sinus rhythm, wandering baseline. PVCs Labs reviewed by me today. H/H and creat normal. Troponin 0.10.   My exam shows:  General: Well developed, well nourished male in NAD  HEENT: OP clear, mucus membranes moist  SKIN: warm, dry. No rashes. Neuro: No focal deficits  Musculoskeletal: Muscle strength 5/5 all ext  Psychiatric: Mood and affect normal  Neck: No JVD, no carotid bruits, no thyromegaly, no lymphadenopathy.  Lungs:Clear bilaterally, no wheezes, rhonci, crackles Cardiovascular: Regular rate and rhythm.    Abdomen:Soft. Bowel sounds present. Non-tender.  Extremities: No lower extremity edema. Pulses are 2 + in the bilateral DP/PT.  Plan:  1. Elevated troponin: Pt has very mild elevation of troponin in the setting of acute GI illness likely due to demand ischemia. No chest pain. EKG without ischemic changes. Agree with cycling troponin. No heparin for  now given subtle elevation of troponin. Agree with arranging an echo to assess LV systolic function and exclude wall motion abnormalities.  2. Atrial fib, paroxysmal: sinus this am. He has been deemed to not be a candidate for long term anticoagulation by our outpatient cardiac clinic.   Lauree Chandler 07/10/2016 11:09 AM

## 2016-07-10 NOTE — ED Triage Notes (Signed)
Pt brought in by EMS from [facility] due to N/Vx4 this am. Pt also reports 1 episode of diarrhea. Pt states he also feels weak and has had difficulty ambulating by himself. Pt A&O at this time, denies abd pain or chest pain. Pt states hx of HTN, DM and afib.

## 2016-07-10 NOTE — Progress Notes (Signed)
ANTICOAGULATION CONSULT NOTE - Initial Consult  Pharmacy Consult for UFH Indication: chest pain/ACS  Allergies  Allergen Reactions  . Latex Itching    Patient Measurements: Height: 5\' 11"  (180.3 cm) Weight: 240 lb (108.9 kg) IBW/kg (Calculated) : 75.3   Vital Signs: Temp: 98.1 F (36.7 C) (07/08 0844) Temp Source: Oral (07/08 0844) BP: 121/73 (07/08 0844) Pulse Rate: 76 (07/08 0844)  Labs:  Recent Labs  07/10/16 0416 07/10/16 0938 07/10/16 1057 07/10/16 1533 07/10/16 1908  HGB 14.1 13.3  --   --   --   HCT 40.7 37.6*  --   --   --   PLT 178 180  --   --   --   APTT  --   --  27  --   --   LABPROT  --   --  13.7  --   --   INR  --   --  1.05  --   --   HEPARINUNFRC  --   --   --   --  0.30  CREATININE 1.05 1.05  --   --   --   TROPONINI 0.10* 0.88*  --  1.32*  --     Estimated Creatinine Clearance: 69.2 mL/min (by C-G formula based on SCr of 1.05 mg/dL).   Medical History: Past Medical History:  Diagnosis Date  . Allergy   . Atrial fibrillation ([place]) 08/30/2013  . Cancer [facility]) pt unsure of year   bladder cancer-Dr. Jacqlyn Larsen  . COPD (chronic obstructive pulmonary disease) ([place])   . Coronary atherosclerosis of native coronary artery 01/05/2016  . Hyperlipidemia   . Hypertension   . Tremor, essential    sees [facility] neurologist  . Type 2 diabetes mellitus with complication, without long-term current use of insulin ([place]) 08/01/2012  . Urinary incontinence      Assessment: 83 yo male here for r/o ACS to start UFH  Hgb 14.1, plt 178  Goal of Therapy:  Heparin level 0.3-0.7 units/ml Anti-Xa level 0.6-1 units/ml 4hrs after LMWH dose given Monitor platelets by anticoagulation protocol: Yes   Plan:  Give 4000 units bolus x 1 Start heparin infusion at 1150 units/hr Check anti-Xa level in 8 hours and daily while on heparin Continue to monitor H&H and platelets  Pharmacy will continue to follow.  7/8 1908 HL therapeutic x 1. Continue current rate. Will recheck  HL in 8 hours.  Laural Benes, Pharm.D., BCPS Clinical Pharmacist 07/10/2016,7:44 PM

## 2016-07-11 ENCOUNTER — Encounter
Admission: EM | Disposition: A | Discharge status: Skilled Nursing Facility | Payer: Self-pay | Source: Home / Self Care | Attending: Internal Medicine

## 2016-07-11 ENCOUNTER — Encounter: Payer: Self-pay | Admitting: *Deleted

## 2016-07-11 DIAGNOSIS — I214 Non-ST elevation (NSTEMI) myocardial infarction: Secondary | ICD-10-CM

## 2016-07-11 DIAGNOSIS — I2510 Atherosclerotic heart disease of native coronary artery without angina pectoris: Secondary | ICD-10-CM

## 2016-07-11 HISTORY — PX: LEFT HEART CATH AND CORONARY ANGIOGRAPHY: CATH118249

## 2016-07-11 LAB — LIPID PANEL
CHOL/HDL RATIO: 2.6 ratio
Cholesterol: 105 mg/dL (ref 0–200)
HDL: 41 mg/dL (ref >40)
LDL CALC: 53 mg/dL (ref 0–99)
TRIGLYCERIDES: 54 mg/dL (ref <150)
VLDL: 11 mg/dL (ref 0–40)

## 2016-07-11 LAB — BASIC METABOLIC PANEL
ANION GAP: 8 (ref 5–15)
BUN: 24 mg/dL — ABNORMAL HIGH (ref 6–20)
CALCIUM: 9.4 mg/dL (ref 8.9–10.3)
CO2: 29 mmol/L (ref 22–32)
Chloride: 105 mmol/L (ref 101–111)
Creatinine, Ser: 1.1 mg/dL (ref 0.61–1.24)
GFR calc non Af Amer: >60 mL/min (ref >60)
Glucose, Bld: 136 mg/dL — ABNORMAL HIGH (ref 65–99)
POTASSIUM: 4.1 mmol/L (ref 3.5–5.1)
Sodium: 142 mmol/L (ref 135–145)

## 2016-07-11 LAB — CREATININE, SERUM
CREATININE: 0.96 mg/dL (ref 0.61–1.24)
GFR calc Af Amer: >60 mL/min (ref >60)

## 2016-07-11 LAB — CBC
HCT: 39.9 % — ABNORMAL LOW (ref 40.0–52.0)
HEMOGLOBIN: 13.8 g/dL (ref 13.0–18.0)
MCH: 34.7 pg — ABNORMAL HIGH (ref 26.0–34.0)
MCHC: 34.7 g/dL (ref 32.0–36.0)
MCV: 100.1 fL — ABNORMAL HIGH (ref 80.0–100.0)
Platelets: 170 10*3/uL (ref 150–440)
RBC: 3.99 MIL/uL — ABNORMAL LOW (ref 4.40–5.90)
RDW: 13 % (ref 11.5–14.5)
WBC: 10.5 10*3/uL (ref 3.8–10.6)

## 2016-07-11 LAB — ECHOCARDIOGRAM COMPLETE
Height: 71 in
Weight: 3840 oz

## 2016-07-11 LAB — HEPARIN LEVEL (UNFRACTIONATED): Heparin Unfractionated: 0.45 IU/mL (ref 0.30–0.70)

## 2016-07-11 LAB — GLUCOSE, CAPILLARY
GLUCOSE-CAPILLARY: 153 mg/dL — AB (ref 65–99)
Glucose-Capillary: 182 mg/dL — ABNORMAL HIGH (ref 65–99)
Glucose-Capillary: 212 mg/dL — ABNORMAL HIGH (ref 65–99)

## 2016-07-11 SURGERY — LEFT HEART CATH AND CORONARY ANGIOGRAPHY
Anesthesia: Moderate Sedation

## 2016-07-11 MED ORDER — SODIUM CHLORIDE 0.9 % IV SOLN: 250.0000 mL | INTRAVENOUS | Status: DC | PRN

## 2016-07-11 MED ORDER — VERAPAMIL HCL 2.5 MG/ML IV SOLN
INTRAVENOUS | Status: AC
  Filled 2016-07-11: qty 2

## 2016-07-11 MED ORDER — IOPAMIDOL (ISOVUE-300) INJECTION 61%
INTRAVENOUS | Status: DC | PRN
  Administered 2016-07-11: 70 mL via INTRA_ARTERIAL

## 2016-07-11 MED ORDER — VERAPAMIL HCL 2.5 MG/ML IV SOLN
INTRAVENOUS | Status: DC | PRN
  Administered 2016-07-11: 2.5 mg via INTRAVENOUS

## 2016-07-11 MED ORDER — SODIUM CHLORIDE 0.9 % WEIGHT BASED INFUSION: 1.0000 mL/kg/h | INTRAVENOUS | Status: DC

## 2016-07-11 MED ORDER — ENOXAPARIN SODIUM 40 MG/0.4ML ~~LOC~~ SOLN
40.0000 mg | SUBCUTANEOUS | Status: DC
  Administered 2016-07-12 – 2016-07-13 (×2): 40 mg via SUBCUTANEOUS
  Filled 2016-07-11 (×2): qty 0.4

## 2016-07-11 MED ORDER — TRAZODONE HCL 50 MG PO TABS
50.0000 mg | ORAL_TABLET | Freq: Every evening | ORAL | Status: DC | PRN
  Administered 2016-07-11: 50 mg via ORAL
  Filled 2016-07-11: qty 1

## 2016-07-11 MED ORDER — LISINOPRIL 5 MG PO TABS
5.0000 mg | ORAL_TABLET | Freq: Once | ORAL | Status: AC
  Administered 2016-07-11: 5 mg via ORAL
  Filled 2016-07-11: qty 1

## 2016-07-11 MED ORDER — LISINOPRIL 10 MG PO TABS
10.0000 mg | ORAL_TABLET | Freq: Every day | ORAL | Status: DC
  Administered 2016-07-12 – 2016-07-13 (×2): 10 mg via ORAL
  Filled 2016-07-11 (×2): qty 1

## 2016-07-11 MED ORDER — SODIUM CHLORIDE 0.9% FLUSH
3.0000 mL | Freq: Two times a day (BID) | INTRAVENOUS | Status: DC
  Administered 2016-07-11 – 2016-07-13 (×5): 3 mL via INTRAVENOUS

## 2016-07-11 MED ORDER — SODIUM CHLORIDE 0.9% FLUSH: 3.0000 mL | INTRAVENOUS | Status: DC | PRN

## 2016-07-11 MED ORDER — HEPARIN SODIUM (PORCINE) 1000 UNIT/ML IJ SOLN
INTRAMUSCULAR | Status: AC
  Filled 2016-07-11: qty 1

## 2016-07-11 MED ORDER — ASPIRIN 81 MG PO CHEW: 81.0000 mg | CHEWABLE_TABLET | ORAL | Status: DC

## 2016-07-11 MED ORDER — ATORVASTATIN CALCIUM 20 MG PO TABS
40.0000 mg | ORAL_TABLET | Freq: Every day | ORAL | Status: DC
  Administered 2016-07-11 – 2016-07-12 (×2): 40 mg via ORAL
  Filled 2016-07-11 (×2): qty 2

## 2016-07-11 MED ORDER — SODIUM CHLORIDE 0.9% FLUSH
3.0000 mL | Freq: Two times a day (BID) | INTRAVENOUS | Status: DC
  Administered 2016-07-11: 3 mL via INTRAVENOUS

## 2016-07-11 MED ORDER — HEPARIN (PORCINE) IN NACL 2-0.9 UNIT/ML-% IJ SOLN
INTRAMUSCULAR | Status: AC
  Filled 2016-07-11: qty 500

## 2016-07-11 MED ORDER — FENTANYL CITRATE (PF) 100 MCG/2ML IJ SOLN
INTRAMUSCULAR | Status: AC
  Filled 2016-07-11: qty 2

## 2016-07-11 MED ORDER — DIPHENHYDRAMINE HCL 50 MG/ML IJ SOLN
25.0000 mg | Freq: Once | INTRAMUSCULAR | Status: AC
Start: 2016-07-11 — End: 2016-07-11
  Administered 2016-07-11: 25 mg via INTRAVENOUS

## 2016-07-11 MED ORDER — SENNOSIDES-DOCUSATE SODIUM 8.6-50 MG PO TABS
1.0000 | ORAL_TABLET | Freq: Two times a day (BID) | ORAL | Status: DC
  Administered 2016-07-11 – 2016-07-12 (×2): 1 via ORAL
  Filled 2016-07-11 (×2): qty 1

## 2016-07-11 MED ORDER — CARVEDILOL 3.125 MG PO TABS
3.1250 mg | ORAL_TABLET | Freq: Two times a day (BID) | ORAL | Status: DC
  Administered 2016-07-11 – 2016-07-13 (×4): 3.125 mg via ORAL
  Filled 2016-07-11 (×4): qty 1

## 2016-07-11 MED ORDER — HEPARIN SODIUM (PORCINE) 1000 UNIT/ML IJ SOLN
INTRAMUSCULAR | Status: DC | PRN
  Administered 2016-07-11: 5000 [IU] via INTRAVENOUS

## 2016-07-11 MED ORDER — SODIUM CHLORIDE 0.9 % IV SOLN: INTRAVENOUS | Status: AC

## 2016-07-11 MED ORDER — MIDAZOLAM HCL 2 MG/2ML IJ SOLN
INTRAMUSCULAR | Status: AC
  Filled 2016-07-11: qty 2

## 2016-07-11 MED ORDER — SODIUM CHLORIDE 0.9 % WEIGHT BASED INFUSION: 3.0000 mL/kg/h | INTRAVENOUS | Status: DC

## 2016-07-11 MED ORDER — DIPHENHYDRAMINE HCL 50 MG/ML IJ SOLN
INTRAMUSCULAR | Status: AC
  Filled 2016-07-11: qty 1

## 2016-07-11 SURGICAL SUPPLY — 6 items
CATH OPTITORQUE JACKY 4.0 5F (CATHETERS) ×2 IMPLANT
DEVICE RAD TR BAND REGULAR (VASCULAR PRODUCTS) ×2 IMPLANT
GLIDESHEATH SLEND SS 6F .021 (SHEATH) ×2 IMPLANT
KIT MANI 3VAL PERCEP (MISCELLANEOUS) ×2 IMPLANT
PACK CARDIAC CATH (CUSTOM PROCEDURE TRAY) ×2 IMPLANT
WIRE ROSEN-J .035X260CM (WIRE) ×2 IMPLANT

## 2016-07-11 NOTE — Progress Notes (Signed)
Pt & pt's daughter requesting for pt to have something to help pt's bowels move. Text page sent to MD, orders for senokot-S were placed. Will give when scheduled. Conley [name], RN, BSN

## 2016-07-11 NOTE — Progress Notes (Signed)
Patient Name: Chase Simmons Date of Encounter: 07/11/2016  Primary Cardiologist: [facility] Problem List     Principal Problem:   Nausea & vomiting Active Problems:   Elevated troponin   Diarrhea   Persistent atrial fibrillation ([facility])   NSTEMI (non-ST elevated myocardial infarction) ([facility])     Subjective   No chest pain or SOB. Troponin has peaked at 1.32, now down trending. Started on a heparin gtt. Echo preliminarily reviewed and shows distal WMA. No further nausea or vomiting.   Inpatient Medications    Scheduled Meds: . aspirin EC  325 mg Oral Daily  . atorvastatin  80 mg Oral q1800  . insulin aspart  0-5 Units Subcutaneous QHS  . insulin aspart  0-9 Units Subcutaneous TID WC  . lisinopril  5 mg Oral Daily  . PARoxetine  40 mg Oral Daily  . primidone  75 mg Oral BID  . tiotropium  18 mcg Inhalation Daily   Continuous Infusions: . sodium chloride 75 mL/hr at 07/11/16 0150  . heparin 1,150 Units/hr (07/10/16 1116)   PRN Meds: acetaminophen, ALPRAZolam, diphenhydrAMINE-zinc acetate, ipratropium-albuterol, nitroGLYCERIN, ondansetron (ZOFRAN) IV, traZODone   Vital Signs    Vitals:   07/10/16 0844 07/10/16 1727 07/10/16 1957 07/11/16 0346  BP: 121/73  139/67 (!) 156/54  Pulse: 76  66 (!) 53  Resp: 18  18 18   Temp: 98.1 F (36.7 C)  98.7 F (37.1 C) 98.8 F (37.1 C)  TempSrc: Oral  Oral Oral  SpO2: 99% 97% 99% 96%  Weight:      Height:        Intake/Output Summary (Last 24 hours) at 07/11/16 0857 Last data filed at 07/11/16 0840  Gross per 24 hour  Intake              240 ml  Output             2825 ml  Net            -2585 ml   Filed Weights   07/10/16 0409  Weight: 240 lb (108.9 kg)    Physical Exam    GEN: Well nourished, well developed, in no acute distress.  HEENT: Grossly normal.  Neck: Supple, no JVD, carotid bruits, or masses. Cardiac: RRR, no murmurs, rubs, or gallops. No clubbing, cyanosis, edema.  Radials/DP/PT 2+  and equal bilaterally.  Respiratory:  Respirations regular and unlabored, clear to auscultation bilaterally. GI: Soft, nontender, nondistended, BS + x 4. MS: no deformity or atrophy. Skin: warm and dry, no rash. Neuro:  Strength and sensation are intact. Psych: AAOx3.  Normal affect.  Labs    CBC  Recent Labs  07/10/16 0416 07/10/16 0938  WBC 9.7 9.8  NEUTROABS 8.5*  --   HGB 14.1 13.3  HCT 40.7 37.6*  MCV 101.3* 99.7  PLT 178 149   Basic Metabolic Panel  Recent Labs  07/10/16 0416 07/10/16 0938 07/11/16 0305  NA 140  --  142  K 4.2  --  4.1  CL 101  --  105  CO2 29  --  29  GLUCOSE 225*  --  136*  BUN 27*  --  24*  CREATININE 1.05 1.05 1.10  CALCIUM 9.4  --  9.4  MG 1.7  --   --    Liver Function Tests  Recent Labs  07/10/16 0416  AST 27  ALT 22  ALKPHOS 71  BILITOT 0.6  PROT 7.4  ALBUMIN 4.0  Recent Labs  07/10/16 0416  LIPASE 23   Cardiac Enzymes  Recent Labs  07/10/16 0938 07/10/16 1533 07/10/16 2122  TROPONINI 0.88* 1.32* 0.93*   BNP Invalid input(s): POCBNP D-Dimer No results for input(s): DDIMER in the last 72 hours. Hemoglobin A1C No results for input(s): HGBA1C in the last 72 hours. Fasting Lipid Panel  Recent Labs  07/11/16 0305  CHOL 105  HDL 41  LDLCALC 53  TRIG 54  CHOLHDL 2.6   Thyroid Function Tests No results for input(s): TSH, T4TOTAL, T3FREE, THYROIDAB in the last 72 hours.  Invalid input(s): FREET3  Telemetry    sinus - Personally Reviewed  ECG    n/a - Personally Reviewed  Radiology    Ct Abdomen Pelvis W Contrast  Result Date: 07/10/2016 IMPRESSION: 1. No acute abnormality seen to explain the patient's symptoms. 2. Cholelithiasis.  Gallbladder otherwise unremarkable. 3. Scattered diverticulosis along the entirety of the colon, without evidence of diverticulitis. 4. Scattered aortic atherosclerosis. 5. Scattered coronary artery calcifications seen. 6. Mild nonspecific interstitial prominence at  the lung bases. 7. Nonspecific vague 1.0 cm hypodensity at the inferior aspect of the right hepatic lobe is relatively stable from 2017 and likely benign. Electronically Signed   By: Garald Balding M.D.   On: 07/10/2016 06:32    Cardiac Studies   TTE 07/10/16 pending  Patient Profile     Chase Simmons with history of persistent Afib not on full dose anticoagulation given medication noncompliance and recent falls. Also with a history of asymptomatic bradycardia with possible AV dissociation, HTN, and HLD who presented to [facility] with nausea, vomiting, and diarrhea.   Assessment & Plan    1. Elevated troponin/possible NSTEMI vs stress-induced cardiomyopathy: -Currently chest pain free -Troponin peaked at 1.32, now down trending -Heparin gtt -ASA -Discussed with Dr. Fletcher Anon and family -Will proceed with LHC for definitive evaluation per family and patient wishes -Risks and benefits of cardiac catheterization have been discussed with the patient including risks of bleeding, bruising, infection, kidney damage, stroke, heart attack, and death. The patient understands these risks and is willing to proceed with the procedure. All questions have been answered and concerns listened to -Lipitor -Not on beta blocker given bradycardia, asymptomatic  -Await formal read of TTE  2. Persistent Afib: -Currently in sinus rhythm -Not on full dose anticoagulation at home given medication compliance and recent falls -CHADS2VASc at least 4 (HTN, age x 2, DM) -Not on beta blocker given bradycardic rates with history of possible AV dissociation   3. GI illness: -Improved -Cannot rule out anginal equivalent   4. LV dysfunction: -He does not appear grossly volume overloaded at this time -TTE pending -Not on BB as above  5. DM2: -Metformin held at time of admission -Will need to hold metformin 48 hours s/p LHC  Signed, Christell Faith, PA-C [facility] Pager: [phone] 07/11/2016, 8:57 AM

## 2016-07-11 NOTE — Progress Notes (Signed)
[place] [facility] [place] NAME: Chase Simmons    MR#:  [id number]  DATE OF BIRTH:  1933-04-14  SUBJECTIVE:  CHIEF COMPLAINT:   Chief Complaint  Patient presents with  . Emesis  . Weakness   No chest pain but generalized weakness. REVIEW OF SYSTEMS:  Review of Systems  Constitutional: Positive for malaise/fatigue. Negative for chills and fever.  HENT: Negative for sore throat.   Eyes: Negative for blurred vision and double vision.  Respiratory: Negative for cough, shortness of breath, wheezing and stridor.   Cardiovascular: Negative for chest pain and leg swelling.  Gastrointestinal: Negative for abdominal pain, blood in stool, diarrhea, melena, nausea and vomiting.  Genitourinary: Negative for dysuria and hematuria.  Musculoskeletal: Negative for back pain.  Neurological: Positive for weakness. Negative for dizziness, focal weakness, loss of consciousness and headaches.  Psychiatric/Behavioral: Negative for depression. The patient is not nervous/anxious.     DRUG ALLERGIES:   Allergies  Allergen Reactions  . Latex Itching   VITALS:  Blood pressure (!) 167/72, pulse 64, temperature 97.9 F (36.6 C), temperature source Oral, resp. rate (!) 28, height 5\' 11"  (1.803 m), weight 238 lb 15.7 oz (108.4 kg), SpO2 99 %. PHYSICAL EXAMINATION:  Physical Exam  Constitutional: He is oriented to person, place, and time and well-developed, well-nourished, and in no distress.  HENT:  Head: Normocephalic.  Mouth/Throat: Oropharynx is clear and moist.  Eyes: Conjunctivae and EOM are normal. No scleral icterus.  Neck: Normal range of motion. Neck supple. No JVD present. No tracheal deviation present.  Cardiovascular: Normal rate, regular rhythm and normal heart sounds.  Exam reveals no gallop.   No murmur heard. Pulmonary/Chest: Effort normal and breath sounds normal. No respiratory distress. He has no wheezes. He has no rales.  Abdominal: Soft.  Bowel sounds are normal. He exhibits no distension. There is no tenderness. There is no rebound.  Musculoskeletal: Normal range of motion. He exhibits no edema or tenderness.  Neurological: He is alert and oriented to person, place, and time. No cranial nerve deficit.  Skin: No rash noted. No erythema.  Psychiatric: Affect normal.   LABORATORY PANEL:  Male CBC  Recent Labs Lab 07/10/16 0938  WBC 9.8  HGB 13.3  HCT 37.6*  PLT 180   ------------------------------------------------------------------------------------------------------------------ Chemistries   Recent Labs Lab 07/10/16 0416  07/11/16 0305  NA 140  --  142  K 4.2  --  4.1  CL 101  --  105  CO2 29  --  29  GLUCOSE 225*  --  136*  BUN 27*  --  24*  CREATININE 1.05  < > 1.10  CALCIUM 9.4  --  9.4  MG 1.7  --   --   AST 27  --   --   ALT 22  --   --   ALKPHOS 71  --   --   BILITOT 0.6  --   --   < > = values in this interval not displayed. RADIOLOGY:  No results found. ASSESSMENT AND PLAN:   NSTEMI, stress-induced cardiomyopathy Troponin is trending up, 0.10-> 0.88->1.32. Treated with aspirin 325 mg by mouth, started heparin drip PTD. On Lipitor and lisinopril, Cardiac cath: Mild nonobstructive coronary artery disease. Severely reduced LV systolic function with an EF of 25-30%. Wall motion abnormalities suggestive of stress-induced cardiomyopathy. Mildly elevated left ventricular end-diastolic pressure. Echo: wall motion abnormalities are suggestive of stress induced vs.   ischemic cardiomyopathy. EF 30-35%.  Intractable nausea and vomiting. zofran prn. Improved.  History of chronic A. fib, now in sinus rhythm. Not on beta blocker given bradycardic rates with history of possible AV dissociation   Dehydration. Improving with normal saline IV. Discontinue NS iv. Hypertension. Continue home hypertension medication. Diabetes. Hold metformin and glimepiride, on sliding scale. COPD. Stable. NEB when  necessary.  Discussed with Dr. Fletcher Anon. Generalized weakness. PT evaluation. Possible need SNF placement. All the records are reviewed and case discussed with Care Management/Social Worker. Management plans discussed with the patient, his daughter and they are in agreement.  CODE STATUS: Full Code  TOTAL TIME TAKING CARE OF THIS PATIENT: 38 minutes.   More than 50% of the time was spent in counseling/coordination of care: YES  POSSIBLE D/C IN 2 DAYS, DEPENDING ON CLINICAL CONDITION.   [name] M.D on 07/11/2016 at 1:38 PM  Between 7am to 6pm - Pager - [phone]  After 6pm go to [email or web address] - Proofreader  [facility]  Office  [phone]  CC: Primary care physician; Coral Spikes, DO  Note: This dictation was prepared with Dragon dictation along with smaller phrase technology. Any transcriptional errors that result from this process are unintentional.

## 2016-07-11 NOTE — Progress Notes (Signed)
ANTICOAGULATION CONSULT NOTE - Initial Consult  Pharmacy Consult for UFH Indication: chest pain/ACS  Allergies  Allergen Reactions  . Latex Itching    Patient Measurements: Height: 5\' 11"  (180.3 cm) Weight: 240 lb (108.9 kg) IBW/kg (Calculated) : 75.3   Vital Signs: Temp: 98.8 F (37.1 C) (07/09 0346) Temp Source: Oral (07/09 0346) BP: 156/54 (07/09 0346) Pulse Rate: 53 (07/09 0346)  Labs:  Recent Labs  07/10/16 0416 07/10/16 0938 07/10/16 1057 07/10/16 1533 07/10/16 1908 07/10/16 2122 07/11/16 0305  HGB 14.1 13.3  --   --   --   --   --   HCT 40.7 37.6*  --   --   --   --   --   PLT 178 180  --   --   --   --   --   APTT  --   --  27  --   --   --   --   LABPROT  --   --  13.7  --   --   --   --   INR  --   --  1.05  --   --   --   --   HEPARINUNFRC  --   --   --   --  0.30  --  0.45  CREATININE 1.05 1.05  --   --   --   --   --   TROPONINI 0.10* 0.88*  --  1.32*  --  0.93*  --     Estimated Creatinine Clearance: 69.2 mL/min (by C-G formula based on SCr of 1.05 mg/dL).   Medical History: Past Medical History:  Diagnosis Date  . Allergy   . Atrial fibrillation ([place]) 08/30/2013  . Cancer [facility]) pt unsure of year   bladder cancer-Dr. Jacqlyn Larsen  . COPD (chronic obstructive pulmonary disease) ([place])   . Coronary atherosclerosis of native coronary artery 01/05/2016  . Hyperlipidemia   . Hypertension   . Tremor, essential    sees [facility] neurologist  . Type 2 diabetes mellitus with complication, without long-term current use of insulin ([place]) 08/01/2012  . Urinary incontinence      Assessment: 83 yo male here for r/o ACS to start UFH  Hgb 14.1, plt 178  Goal of Therapy:  Heparin level 0.3-0.7 units/ml Anti-Xa level 0.6-1 units/ml 4hrs after LMWH dose given Monitor platelets by anticoagulation protocol: Yes   Plan:  Give 4000 units bolus x 1 Start heparin infusion at 1150 units/hr Check anti-Xa level in 8 hours and daily while on heparin Continue to monitor  H&H and platelets  Pharmacy will continue to follow.  7/8 1908 HL therapeutic x 1. Continue current rate. Will recheck HL in 8 hours.  7/9 @ 0300 HL 0.45 therapeutic. Will continue current rate and will recheck HL w/ am labs.  Tobie Lords, Pharm.D., BCPS Clinical Pharmacist 07/11/2016,4:20 AM

## 2016-07-11 NOTE — NC FL2 (Signed)
[place] LEVEL OF CARE SCREENING TOOL     IDENTIFICATION  Patient Name: Chase Simmons Birthdate: 1933-11-13 Sex: male Admission Date (Current Location): 07/10/2016  [place] and [place] Number:  Engineering geologist and Address:  [facility], [address], [place], [place] [address]      Provider Number: [id number]  Attending Physician Name and Address:  [name], MD  Relative Name and Phone Number:  [facility] Daughter [phone] or [place], [place] [phone]     Current Level of Care: Hospital Recommended Level of Care: [place] Prior Approval Number:    Date Approved/Denied:   PASRR Number: [id number]  Discharge Plan: SNF    Current Diagnoses: Patient Active Problem List   Diagnosis Date Noted  . Elevated troponin 07/10/2016  . Nausea & vomiting 07/10/2016  . Diarrhea 07/10/2016  . Persistent atrial fibrillation ([place]) 07/10/2016  . NSTEMI (non-ST elevated myocardial infarction) ([place]) 07/10/2016  . Fall 07/07/2016  . COPD (chronic obstructive pulmonary disease) ([place]) 03/15/2016  . Coronary atherosclerosis of native coronary artery 01/05/2016  . Aortic atherosclerosis ([place]) 01/05/2016  . Bradycardia 01/05/2016  . Anxiety 06/17/2015  . Malignant neoplasm of urinary bladder ([place]) 06/17/2015  . Allergic rhinitis 05/14/2015  . Knee pain, bilateral 03/16/2015  . PAF (paroxysmal atrial fibrillation) ([place]) 08/30/2013  . Acid reflux 08/30/2013  . Tremor, essential 10/22/2012  . Hyperlipidemia 10/22/2012  . HTN (hypertension) 08/01/2012  . Type 2 diabetes mellitus with complication, without long-term current use of insulin ([place]) 08/01/2012  . Benign prostatic hyperplasia with urinary obstruction 09/02/2011    Orientation RESPIRATION BLADDER Height & Weight     Self  Normal Continent Weight: 238 lb 15.7 oz (108.4 kg) Height:  5\' 11"  (180.3 cm)  BEHAVIORAL SYMPTOMS/MOOD NEUROLOGICAL BOWEL NUTRITION  STATUS      Continent Diet (Cardiac)  AMBULATORY STATUS COMMUNICATION OF NEEDS Skin   Limited Assist Verbally Normal                       Personal Care Assistance Level of Assistance  Bathing, Feeding, Dressing Bathing Assistance: Limited assistance Feeding assistance: Independent Dressing Assistance: Limited assistance     Functional Limitations Info  Sight, Hearing, Speech Sight Info: Adequate Hearing Info: Adequate Speech Info: Adequate    SPECIAL CARE FACTORS FREQUENCY  PT (By licensed PT), OT (By licensed OT)     PT Frequency: 5x a week OT Frequency: 5x a week            Contractures      Additional Factors Info  Insulin Sliding Scale, Psychotropic Code Status Info: Full Code Allergies Info: Latex Psychotropic Info: PARoxetine (PAXIL) tablet 40 mg Insulin Sliding Scale Info: insulin aspart (novoLOG) injection 0-9 Units 3x a day with meals       Current Medications (07/11/2016):  This is the current hospital active medication list Current Facility-Administered Medications  Medication Dose Route Frequency Provider Last Rate Last Dose  . 0.9 %  sodium chloride infusion  250 mL Intravenous PRN Wellington Hampshire, MD      . acetaminophen (TYLENOL) tablet 650 mg  650 mg Oral Q4H PRN Saundra Shelling, MD      . ALPRAZolam Duanne Moron) tablet 0.25 mg  0.25 mg Oral BID PRN Saundra Shelling, MD   0.25 mg at 07/10/16 2101  . atorvastatin (LIPITOR) tablet 40 mg  40 mg Oral q1800 Kathlyn Sacramento A, MD      . carvedilol (COREG) tablet 3.125 mg  3.125 mg Oral BID WC Kathlyn Sacramento A, MD   3.125 mg at 07/11/16 1614  . diphenhydrAMINE (BENADRYL) 50 MG/ML injection           . diphenhydrAMINE-zinc acetate (BENADRYL) 2-0.1 % cream   Topical BID PRN [name], MD      . Derrill Memo ON 07/12/2016] enoxaparin (LOVENOX) injection 40 mg  40 mg Subcutaneous Q24H Arida, Muhammad A, MD      . insulin aspart (novoLOG) injection 0-5 Units  0-5 Units Subcutaneous QHS [name], MD      . insulin  aspart (novoLOG) injection 0-9 Units  0-9 Units Subcutaneous TID WC [name], MD   2 Units at 07/11/16 0840  . ipratropium-albuterol (DUONEB) 0.5-2.5 (3) MG/3ML nebulizer solution 3 mL  3 mL Nebulization Q6H PRN Saundra Shelling, MD   3 mL at 07/10/16 1727  . [START ON 07/12/2016] lisinopril (PRINIVIL,ZESTRIL) tablet 10 mg  10 mg Oral Daily Kathlyn Sacramento A, MD      . nitroGLYCERIN (NITROSTAT) SL tablet 0.4 mg  0.4 mg Sublingual Q5 Min x 3 PRN Pyreddy, Reatha Harps, MD      . ondansetron (ZOFRAN) injection 4 mg  4 mg Intravenous Q6H PRN Pyreddy, Pavan, MD      . PARoxetine (PAXIL) tablet 40 mg  40 mg Oral Daily Pyreddy, Reatha Harps, MD   40 mg at 07/11/16 0944  . primidone (MYSOLINE) tablet 75 mg  75 mg Oral BID Saundra Shelling, MD   75 mg at 07/11/16 0944  . sodium chloride flush (NS) 0.9 % injection 3 mL  3 mL Intravenous Q12H Kathlyn Sacramento A, MD   3 mL at 07/11/16 1530  . sodium chloride flush (NS) 0.9 % injection 3 mL  3 mL Intravenous PRN Kathlyn Sacramento A, MD      . tiotropium (SPIRIVA) inhalation capsule 18 mcg  18 mcg Inhalation Daily Saundra Shelling, MD   18 mcg at 07/11/16 0946  . traZODone (DESYREL) tablet 50 mg  50 mg Oral QHS PRN Hugelmeyer, Alexis, DO   50 mg at 07/11/16 0029     Discharge Medications: Please see discharge summary for a list of discharge medications.  Relevant Imaging Results:  Relevant Lab Results:   Additional Information SSN [id number]  Ross Ludwig, [place]

## 2016-07-11 NOTE — Clinical Social Work Note (Addendum)
CSW met with patient and his daughter Chase Simmons [phone] to discuss SNF placement.  CSW discussed with patient's daughter different types of Medicaid and what the process is for applying for Medicaid.  CSW also spoke to patient about option for ALFs verse long term care SNF placement.  CSW is waiting for PT and OT recommendations.  CSW explained to patient's daughter that depending on what PT and OT says will determine if insurance will approve patient to go to SNF.  CSW informed patient's daughter, that if patient is not approved for SNF from insurance company she will have to look at either paying privately or having patient go home with home health.  Patient's daughter is aware of possiblity, she gave CSW permission to begin bed search in [place].  Formal assessment to follow.  Jones Broom. [place], MSW, [place]  07/11/2016 6:09 PM

## 2016-07-11 NOTE — Plan of Care (Signed)
Problem: Education: Goal: Knowledge of [facility] Education information/materials will improve Outcome: Not Progressing Patient too acutely confused today to meet this particular goal. Will continue to monitor for improvement. Wenda Low [facility]

## 2016-07-11 NOTE — Progress Notes (Signed)
Entered patient's room to find water and urine spilled all over the floor, patient completely naked except for a brief, cardiac monitor leads completely off and all but one lead sticker off. IV pump also beeping that it was occluded. Proceeded to clean up spilled water and urine, put new lead stickers on patient and hooked cardiac monitor back up, replaced gown, restarted IV pump, helped patient attempt to urinate in the urinal, and replaced old fluid bottle. Also assessed radial catheterization site as well. Plan to wrap one L arm IV w/ gauze wrap soon so as to preserve site. Reset bed alarm. Will continue to closely monitor. Wenda Low [facility]

## 2016-07-12 ENCOUNTER — Telehealth: Payer: Self-pay | Admitting: Cardiovascular Disease

## 2016-07-12 LAB — GLUCOSE, CAPILLARY
GLUCOSE-CAPILLARY: 161 mg/dL — AB (ref 65–99)
GLUCOSE-CAPILLARY: 137 mg/dL — AB (ref 65–99)
Glucose-Capillary: 212 mg/dL — ABNORMAL HIGH (ref 65–99)
Glucose-Capillary: 204 mg/dL — ABNORMAL HIGH (ref 65–99)

## 2016-07-12 LAB — HEMOGLOBIN A1C
HEMOGLOBIN A1C: 6.8 % — AB (ref 4.8–5.6)
Mean Plasma Glucose: 148 mg/dL

## 2016-07-12 MED ORDER — ASPIRIN EC 325 MG PO TBEC
325.0000 mg | DELAYED_RELEASE_TABLET | Freq: Every day | ORAL | Status: DC
Start: 2016-07-12 — End: 2016-07-13
  Administered 2016-07-12 – 2016-07-13 (×2): 325 mg via ORAL
  Filled 2016-07-12 (×2): qty 1

## 2016-07-12 NOTE — Evaluation (Signed)
Physical Therapy Evaluation Patient Details Name: Chase Simmons MRN: [id number] DOB: [birth year] Today's Date: 07/12/2016   History of Present Illness  83 y/o male admitted on 07/10/16 for nausea and vomiting. Pt found to have elevated troponin (peaked at 1.32). Radial cath performed on 07/11/16 showed reduced EF and evidence of stress-induced cardiomyopathy. PMH includes HTN, DM, CAD, COPD, HLD, bladder cancer, urinary incontinence, bradycardia (cardiologist recommends pacemaker), and essential tremor.     Clinical Impression  Pt is a pleasant 83 year old s/p radial cath performed on 07/11/16. Pt performs bed mobility with min assist-supervision, and transfers/ambulates with +2 min-mod assist for safety. First attempt to transfer from recliner to EOB performed with +1 min assist and platform walker. After there-ex and bed mobility, pt required +2 mod assist and platform walker to mobilize EOB to recliner due to weakness. Heavy verbal cueing required to maintain R wrist precautions following cath procedure. RN notified to use platform walker (in room) and pt's resistance to remain in recliner and statement he would get up on his own. Pt's family educated to call RN before mobilizing pt - chair alarm set. Pt demonstrates deficits in weakness and functional mobility. Would benefit from skilled PT to address above deficits and promote optimal return to PLOF. Pt is motivated to participate in therapy. PT recommends dc SNF given pt's status below his baseline. PT discussed with pt's family and they are in agreement with this dc disposition.     Follow Up Recommendations SNF    Equipment Recommendations  Other (comment) (R platform walker)    Recommendations for Other Services       Precautions / Restrictions Precautions Precautions: Fall Precaution Comments: R radial heart cath on 7/9: RUE minimize use within first 48 hours, no push/pull/lifting >5-10lbs, platform walker if needs AD, gradually increase  use of RUE after 1 week Restrictions Weight Bearing Restrictions: No      Mobility  Bed Mobility Overal bed mobility: Needs Assistance Bed Mobility: Supine to Sit;Sit to Supine;Rolling Rolling: Supervision   Supine to sit: Min assist Sit to supine: Supervision   General bed mobility comments: Bed mobility performed with min assist-supervision. Pt able to scoot to [facility] with supervision when bed flattened.   Transfers Overall transfer level: Needs assistance Equipment used: Right platform walker Transfers: Sit to/from Stand Sit to Stand: Mod assist;+2 physical assistance;+2 safety/equipment         General transfer comment: 1st sit to/from stand performed with +1 min assist and platform walker. 2nd attempt post-bed mobility and ther-ex required +2 mod assist due to pt's weakness and inability to follow R wrist precautions. Heavy verbal cueing to keep R arm across chest when mobilizing. Pt unsteady when first coming to standing position due to weakness and tremor.  Ambulation/Gait Ambulation/Gait assistance: Min assist;+2 physical assistance;+2 safety/equipment Ambulation Distance (Feet): 2 Feet Assistive device: Right platform walker Gait Pattern/deviations: Step-to pattern;Wide base of support;Trunk flexed     General Gait Details: Ambulated recliner to/from EOB (48ft) with R platform walker and +2 min assist for safety given pt's weakness, unsteadiness and desire to use RUE for mobility.   Stairs            Wheelchair Mobility    Modified Rankin (Stroke Patients Only)       Balance Overall balance assessment: History of Falls;Needs assistance Sitting-balance support: Single extremity supported;Feet supported Sitting balance-Leahy Scale: Good Sitting balance - Comments: Good seated balance with feet support and BUE's in lap.  Standing balance comment: Fair standing balance with LUE on walker and RUE in platform walker.                               Pertinent Vitals/Pain Pain Assessment: No/denies pain    Home Living Family/patient expects to be discharged to:: Private residence Living Arrangements: Alone Available Help at Discharge: Family;Available PRN/intermittently Type of Home: Independent living facility Home Access: Elevator;Stairs to enter Entrance Stairs-Rails: Can reach both Entrance Stairs-Number of Steps: 14 Home Layout: One level Home Equipment: Walker - 2 wheels;Bedside commode;Grab bars - toilet;Grab bars - tub/shower;Wheelchair - manual Additional Comments: apartment on 2nd floor, 2 flights of stairs very close to apartment, elevator on the other end of the hallway that is difficult to get to from w/c level without assist.    Prior Function Level of Independence: Needs assistance   Gait / Transfers Assistance Needed: pt primarily uses w/c for mobility, able to perform w/c transfers independently, although daughter notes pt has had increased difficulty with transfers over past couple weeks  ADL's / Homemaking Assistance Needed: daughter assists with med mgt (sets up meds in Environmental education officer), pt indep with basic self care tasks, assist for household chores, meals; pt sleeps in a recliner generally and has erratic sleeping schedule; pt/family endorses pt has difficulty remembering to take meds occasionally  Comments: pt unsure how many falls he's had in past 12 months, aside from 1 fall in bathroom last week resulting in CT scan of head; daughter reports pt used to fall frequently prior to STR last summer to get stronger     Hand Dominance   Dominant Hand: Right    Extremity/Trunk Assessment   Upper Extremity Assessment Upper Extremity Assessment: Defer to OT evaluation RUE Deficits / Details: R shoulder flexion approx 0-100 due to old shoulder injury, slightly painful with movement near end range of flexion; otherwise ROM WFL, strength grossly 4/5 LUE Deficits / Details: grossly 4/5, full ROM    Lower Extremity  Assessment Lower Extremity Assessment: Overall WFL for tasks assessed (grossly 4+/5 B for DF/PF and knee flex/ext)       Communication   Communication: HOH  Cognition Arousal/Alertness: Awake/alert Behavior During Therapy: WFL for tasks assessed/performed Overall Cognitive Status: Within Functional Limits for tasks assessed                                 General Comments: Able to follow commands. Pt mildly impulsive - states he will get out of the chair on his own if he wants to. PT educated pt and family on necessity of staff being present to transfer pt (chair alarm set).      General Comments General comments (skin integrity, edema, etc.): bruising on LUE (nursing aware, from pt pulling out IVs previously), no RUE edema or significant bruising noted    Exercises Other Exercises Other Exercises: pt/family educated in medication management and problem solving strategies to support adherence and remembering to take medications; son expressed doubts that a timer/alarm would help much; pt/family agreeable to continue problem solving   Assessment/Plan    PT Assessment Patient needs continued PT services  PT Problem List Decreased strength;Decreased balance;Decreased mobility       PT Treatment Interventions Gait training;Stair training;Therapeutic activities;Therapeutic exercise;Balance training;Patient/family education    PT Goals (Current goals can be found in the Care Plan section)  Acute Rehab PT  Goals Patient Stated Goal: get better PT Goal Formulation: With patient Time For Goal Achievement: 07/26/16 Potential to Achieve Goals: Good    Frequency Min 2X/week   Barriers to discharge        Co-evaluation               AM-PAC PT "6 Clicks" Daily Activity  Outcome Measure Difficulty turning over in bed (including adjusting bedclothes, sheets and blankets)?: A Little Difficulty moving from lying on back to sitting on the side of the bed? :  Total Difficulty sitting down on and standing up from a chair with arms (e.g., wheelchair, bedside commode, etc,.)?: Total Help needed moving to and from a bed to chair (including a wheelchair)?: A Lot Help needed walking in hospital room?: A Lot Help needed climbing 3-5 steps with a railing? : Total 6 Click Score: 10    End of Session Equipment Utilized During Treatment: Gait belt Activity Tolerance: Patient tolerated treatment well;Other (comment) (Mildly limited by R wrist precautions and weakness) Patient left: in chair;with call bell/phone within reach;with chair alarm set;with family/visitor present Nurse Communication: Mobility status;Precautions (platform walker) PT Visit Diagnosis: Unsteadiness on feet (R26.81);Repeated falls (R29.6);Muscle weakness (generalized) (M62.81)    Time: 7793-9030 PT Time Calculation (min) (ACUTE ONLY): 29 min   Charges:         PT G Codes:        Donaciano Eva, PT, SPT  Alanta Scobey 07/12/2016, 11:05 AM

## 2016-07-12 NOTE — Evaluation (Signed)
Occupational Therapy Evaluation Patient Details Name: Chase Simmons MRN: [id number] DOB: 07/27/1933 Today's Date: 07/12/2016    History of Present Illness 83 y.o. male with a known history of Hypertension, diabetes, CAD, COPD, hyperlipidemia and bladder cancer presented to ED on 7/8 with nausea and vomiting. Pt feels generalized weakness, several episodes of diarrhea over past couple days. He was found elevated troponin in the ED. He said he has history of bradycardia and was told by cardiologist that he may need a pacemaker. Right radial heart catheterization on 7/9.    Clinical Impression   Pt seen for OT evaluation this date. Pt was primarily using a wheelchair for mobility and independent [place] wc [place] prior to admission. Daughter endorses recent fall in bathroom and increased difficulty with functional transfers over the past couple weeks. Son occasionally assists with transportation needs, daughter assists with medication management, however, pt often forgets to take medications. Supportive family but work during the day. O2 sats >93%, HR between 68-75 during session. Pt denies N/V during session. Pt presents with decreased strength, activity tolerance, and safety/independence with functional mobility and basic self care tasks, requiring increased assist for ADL and mobility. Recommend STR following hospitalization to continue to address goals. Pt may be more appropriate for ALF level of care longer term, to support medication mgt and mobility/self care needs depending on how pt does with rehab.   Follow Up Recommendations  SNF    Equipment Recommendations  Other (comment) (defer to next venue of care)    Recommendations for Other Services       Precautions / Restrictions Precautions Precautions: Fall;Other (comment) Precaution Comments: R radial heart cath on 7/9: RUE minimize use within first 48 hours, no push/pull/lifting >5-10lbs,  platform walker if needs AD, gradually increase use of RUE after 1 week Restrictions Weight Bearing Restrictions: No      Mobility Bed Mobility               General bed mobility comments: deferred d/t pt up in recliner at start of session  Transfers                      Balance Overall balance assessment: History of Falls;Needs assistance Sitting-balance support: Single extremity supported;Feet supported Sitting balance-Leahy Scale: Good                                     ADL either performed or assessed with clinical judgement   ADL Overall ADL's : Needs assistance/impaired Eating/Feeding: Sitting;Set up   Grooming: Set up;Sitting   Upper Body Bathing: Sitting;Set up;Supervision/ safety   Lower Body Bathing: Sitting/lateral leans;Moderate assistance   Upper Body Dressing : Set up;Supervision/safety;Sitting   Lower Body Dressing: Set up;Sitting/lateral leans;Moderate assistance                 General ADL Comments: pt generally mod assist level for LB ADL     Vision Baseline Vision/History: Cataracts (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; L cataract surgically fixed) Patient Visual Report: No change from baseline Vision Assessment?: No apparent visual deficits     Perception     Praxis      Pertinent Vitals/Pain Pain Assessment: No/denies pain     Hand Dominance Right   Extremity/Trunk Assessment Upper Extremity Assessment Upper Extremity Assessment: RUE deficits/detail;LUE deficits/detail RUE Deficits / Details: R shoulder flexion approx  0-100 due to old shoulder injury, slightly painful with movement near end range of flexion; otherwise ROM WFL, strength grossly 4/5 LUE Deficits / Details: grossly 4/5, full ROM   Lower Extremity Assessment Lower Extremity Assessment: Defer to PT evaluation;Generalized weakness       Communication Communication Communication: HOH   Cognition Arousal/Alertness:  Awake/alert Behavior During Therapy: WFL for tasks assessed/performed Overall Cognitive Status: Difficult to assess                                 General Comments: alert and oriented to place, situation, people, self; unsure on day of the week; able to follow commands consistently; required a few verbal cues during questioning    General Comments  bruising on LUE (nursing aware, from pt pulling out IVs previously), no RUE edema or significant bruising noted    Exercises Other Exercises Other Exercises: pt/family educated in medication management and problem solving strategies to support adherence and remembering to take medications; son expressed doubts that a timer/alarm would help much; pt/family agreeable to continue problem solving   Shoulder Instructions      Home Living Family/patient expects to be discharged to:: Private residence ([place] apartment) Living Arrangements: Alone Available Help at Discharge: Family;Available PRN/intermittently (adult children work during the day) Type of Home: Independent living facility ([facility] apartment, 2nd floor) Home Access: Elevator;Stairs to enter (apartment on 2nd floor, 2 flights of stairs very close to apartment, elevator on the other end of the hallway that is difficult to get to from w/c level without assist)     Home Layout: One level     Bathroom Shower/Tub: Teacher, early years/pre: [place]: Environmental consultant - 2 wheels;Bedside commode;Grab bars - toilet;Grab bars - tub/shower;Wheelchair - manual          Prior Functioning/Environment Level of Independence: Needs assistance  Gait / Transfers Assistance Needed: pt primarily uses w/c for mobility, able to perform w/c transfers independently, although daughter notes pt has had increased difficulty with transfers over past couple weeks ADL's / Homemaking Assistance Needed: daughter assists with med mgt (sets up meds in Environmental education officer), pt indep with basic self  care tasks, assist for household chores, meals; pt sleeps in a recliner generally and has erratic sleeping schedule; pt/family endorses pt has difficulty remembering to take meds occasionally Communication / Swallowing Assistance Needed: HOH Comments: pt unsure how many falls he's had in past 12 months, aside from 1 fall in bathroom last week resulting in CT scan of head; daughter reports pt used to fall frequently prior to STR last summer to get stronger        OT Problem List: Decreased strength;Decreased safety awareness;Decreased activity tolerance;Decreased knowledge of use of DME or AE;Impaired balance (sitting and/or standing);Impaired vision/perception;Impaired UE functional use      OT Treatment/Interventions: Self-care/ADL training;Therapeutic exercise;Therapeutic activities;Energy conservation;DME and/or AE instruction;Patient/family education    OT Goals(Current goals can be found in the care plan section) Acute Rehab OT Goals Patient Stated Goal: get better OT Goal Formulation: With patient/family Time For Goal Achievement: 07/26/16 Potential to Achieve Goals: Good  OT Frequency: Min 1X/week   Barriers to D/C: Decreased caregiver support;Inaccessible home environment          Co-evaluation              AM-PAC PT "6 Clicks" Daily Activity     Outcome Measure Help from another person  eating meals?: None Help from another person taking care of personal grooming?: None Help from another person toileting, which includes using toliet, bedpan, or urinal?: A Lot Help from another person bathing (including washing, rinsing, drying)?: A Lot Help from another person to put on and taking off regular upper body clothing?: A Little Help from another person to put on and taking off regular lower body clothing?: A Lot 6 Click Score: 17   End of Session    Activity Tolerance: Patient tolerated treatment well Patient left: in chair;with call bell/phone within reach;with chair  alarm set;with family/visitor present;Other (comment) (PT in room to evaluate)  OT Visit Diagnosis: Other abnormalities of gait and mobility (R26.89);Repeated falls (R29.6);Muscle weakness (generalized) (M62.81)                Time: 8088-1103 OT Time Calculation (min): 39 min Charges:  OT General Charges $OT Visit: 1 Procedure OT Evaluation $OT Eval Moderate Complexity: 1 Procedure OT Treatments $Self Care/Home Management : 8-22 mins G-Codes:     Jeni Salles, MPH, MS, OTR/L ascom [phone] 07/12/16, 10:58 AM

## 2016-07-12 NOTE — Plan of Care (Signed)
Problem: Pain Managment: Goal: General experience of comfort will improve Outcome: Progressing No complaints of pain this shift. Will continue to monitor.  Problem: Bowel/Gastric: Goal: Will not experience complications related to bowel motility Outcome: Completed/Met Date Met: 07/12/16 BM this shift.

## 2016-07-12 NOTE — Progress Notes (Signed)
Progress Note  Patient Name: Chase Simmons Date of Encounter: 07/12/2016  Primary Cardiologist: Previously Dr. Yvone Neu --> Now Dr. Fletcher Anon  Subjective   He denies any recent chest pain or dyspnea on exertion. Respiratory status is at baseline. His main concern is that he had recurrent diarrhea this AM.   Inpatient Medications    Scheduled Meds: . atorvastatin  40 mg Oral q1800  . carvedilol  3.125 mg Oral BID WC  . enoxaparin (LOVENOX) injection  40 mg Subcutaneous Q24H  . insulin aspart  0-5 Units Subcutaneous QHS  . insulin aspart  0-9 Units Subcutaneous TID WC  . lisinopril  10 mg Oral Daily  . PARoxetine  40 mg Oral Daily  . primidone  75 mg Oral BID  . sodium chloride flush  3 mL Intravenous Q12H  . tiotropium  18 mcg Inhalation Daily   Continuous Infusions: . sodium chloride     PRN Meds: sodium chloride, acetaminophen, ALPRAZolam, diphenhydrAMINE-zinc acetate, ipratropium-albuterol, nitroGLYCERIN, ondansetron (ZOFRAN) IV, sodium chloride flush, traZODone   Vital Signs    Vitals:   07/11/16 2012 07/12/16 0454 07/12/16 0757 07/12/16 1134  BP: 136/73 133/74 (!) 128/47 (!) 112/56  Pulse: 78 (!) 59 65 62  Resp: 18 18 18 19   Temp: 98.3 F (36.8 C) 99.4 F (37.4 C) 97.8 F (36.6 C) 97.9 F (36.6 C)  TempSrc: Oral Oral Oral   SpO2: 96% 96% 96% 90%  Weight:      Height:        Intake/Output Summary (Last 24 hours) at 07/12/16 1242 Last data filed at 07/12/16 1204  Gross per 24 hour  Intake              363 ml  Output               50 ml  Net              313 ml   Filed Weights   07/10/16 0409 07/11/16 0955  Weight: 240 lb (108.9 kg) 238 lb 15.7 oz (108.4 kg)    Telemetry    NSR, HR in mid-50's to 70's. Pauses up to 2.2 seconds. Occasional PVC's.  - Personally Reviewed  ECG    No new tracings.   Physical Exam   General: Well developed, elderly Caucasian male appearing in no acute distress. Head: Normocephalic, atraumatic.  Neck: Supple without  bruits, JVD not elevated. Lungs:  Resp regular and unlabored, CTA without wheezing or rales. Heart: RRR, S1, S2, no S3, S4, or murmur; no rub. Abdomen: Soft, non-tender, non-distended with normoactive bowel sounds. No hepatomegaly. No rebound/guarding. No obvious abdominal masses. Extremities: No clubbing, cyanosis, or lower extremity edema. Distal pedal pulses are 2+ bilaterally. Neuro: Alert and oriented X 3. Moves all extremities spontaneously. Psych: Normal affect.  Labs    Chemistry Recent Labs Lab 07/10/16 [phone] 07/10/16 0938 07/11/16 0305 07/11/16 1436  NA 140  --  142  --   K 4.2  --  4.1  --   CL 101  --  105  --   CO2 29  --  29  --   GLUCOSE 225*  --  136*  --   BUN 27*  --  24*  --   CREATININE 1.05 1.05 1.10 0.96  CALCIUM 9.4  --  9.4  --   PROT 7.4  --   --   --   ALBUMIN 4.0  --   --   --   AST 27  --   --   --  ALT 22  --   --   --   ALKPHOS 71  --   --   --   BILITOT 0.6  --   --   --   GFRNONAA >60 >60 >60 >60  GFRAA >60 >60 >60 >60  ANIONGAP 10  --  8  --      Hematology Recent Labs Lab 07/10/16 0416 07/10/16 0938 07/11/16 1436  WBC 9.7 9.8 10.5  RBC 4.02* 3.77* 3.99*  HGB 14.1 13.3 13.8  HCT 40.7 37.6* 39.9*  MCV 101.3* 99.7 100.1*  MCH 35.1* 35.1* 34.7*  MCHC 34.7 35.3 34.7  RDW 12.8 12.8 13.0  PLT 178 180 170    Cardiac Enzymes Recent Labs Lab 07/10/16 0416 07/10/16 0938 07/10/16 1533 07/10/16 2122  TROPONINI 0.10* 0.88* 1.32* 0.93*   No results for input(s): TROPIPOC in the last 168 hours.   BNPNo results for input(s): BNP, PROBNP in the last 168 hours.   DDimer No results for input(s): DDIMER in the last 168 hours.   Radiology    No results found.  Cardiac Studies   Echocardiogram: 07/2016 Study Conclusions  - Left ventricle: The cavity size was mildly dilated. There was   mild concentric hypertrophy. Systolic function was moderately to   severely reduced. The estimated ejection fraction was in the   range of 30%  to 35%. Severe hypokinesis of the   mid-apicalanteroseptal, anterior, and inferior myocardium.   Doppler parameters are consistent with abnormal left ventricular   relaxation (grade 1 diastolic dysfunction). - Mitral valve: There was mild regurgitation. - Left atrium: The atrium was mildly dilated. - Pulmonary arteries: Systolic pressure was mildly increased. PA   peak pressure: 45 mm Hg (S).  Impressions: - wall motion abnormalities are suggestive of stress induced vs.   ischemic cardiomyopathy.  Cardiac Catheterization: 07/11/2016  The left ventricular ejection fraction is 25-35% by visual estimate.  There is severe left ventricular systolic dysfunction.  LV end diastolic pressure is mildly elevated.  Mid Cx lesion, 20 %stenosed.  Ost 1st Diag to 1st Diag lesion, 40 %stenosed.   1. Mild nonobstructive coronary artery disease. 2. Severely reduced LV systolic function with an EF of 25-30%. Wall motion abnormalities suggestive of stress-induced cardiomyopathy. Mildly elevated left ventricular end-diastolic pressure.  Recommendations: The patient's presentation and cardiac cath findings are consistent with stress-induced cardiomyopathy. Recommend medical therapy. He is already on ACE inhibitor. I started small dose carvedilol.   Patient Profile     83 y.o. male with PMH of persistent Afib (not on full dose anticoagulation given medication noncompliance and recent falls), asymptomatic bradycardia with possible AV dissociation, HTN, and HLD who presented to [facility] with nausea, vomiting, and diarrhea. Found to have EF of 30-35%. Cath showing mild nonobstructive CAD.    Assessment & Plan    1. Acute Systolic CHF/ Nonischemic Cardiomyopathy - patient presented with nausea, vomiting, and diarrhea. Found to have a troponin bump of 1.32. - echo showed a reduced EF of 30-35% with WMA, therefore a cardiac catheterization was recommended.  - cath on 7/9 showed mild nonobstructive CAD with  an EF of 25-30%. Therefore possible etiology includes viral-induced cardiomyopathy (in the setting of his recent GI illness), tachycardia-induced cardiomyopathy, or stress-induced cardiomyopathy.  - he does not appear volume overloaded by physical examination. Remains on medical therapy with Coreg 3.125mg  BID and Lisinopril 10mg  daily. Would not further titrate his BB at this time with known history of bradycardia and occasional pauses up to 2.2 seconds noted  on telemetry. Consider the addition of Spironolactone or Entresto (would need to hold ACE-I for 36 hours prior to initiation of this) if EF remains reduced. Consider repeat echo in 3-6 months to reassess EF.   2. Paroxysmal Atrial Fibrillation - has a history of this but has maintained NSR this admission. - This patients CHA2DS2-VASc Score and unadjusted Ischemic Stroke Rate (% per year) is equal to 7.2 % stroke rate/year from a score of 5 (HTN, Age (2), DM, CHF). Not on anticoagulation secondary to history of medication noncompliance and frequent falls.   3. Bradycardia - known history of bradycardia and pauses up to 2.2 seconds noted on telemetry.  - started on Coreg 3.125mg  BID in the setting of his cardiomyopathy. Would not up-titrate with his slow HR and conduction delays.   4. Nausea, Vomiting, Diarrhea - per admitting team.    Signed, Erma Heritage , PA-C 12:42 PM 07/12/2016 Pager: [phone]    Attending Note Patient seen and examined, agree with detailed note above,  Patient presentation and plan discussed on rounds 07/13/2016  Patient seen on rounds today, reports he is very angry Angry that his daughter gave his underwear and change of clothing to a nurse He does not know where the clothing went  Reports he has not had any further diarrhea but wants to no one caused a the first place, Otherwise reports that he feels well  On physical exam lungs are clear to auscultation bilaterally, no JVP, heart sounds  regular with normal S1-S2 no murmurs appreciated, abdomen soft nontender, no significant lower extremity edema  Cardiac catheterization results reviewed Nonobstructive disease, severely depressed ejection fraction  ----Nonischemic cardiomyopathy Cardiac catheterization showing nonobstructive disease Would continue on carvedilol, lisinopril As an outpatient would consider entresto/Aldactone  --- Paroxysmal atrial fibrillation Maintaining normal sinus rhythm History of medication noncompliance, not on anticoagulation  Greater than 50% was spent in counseling and coordination of care with patient Total encounter time 25 minutes or more   Signed: Esmond Plants  M.D., Ph.D. [facility]

## 2016-07-12 NOTE — Progress Notes (Signed)
Central Telemetry notified that patient had two 2 second pauses. MD notified. No new orders at this time. Will continue to monitor.

## 2016-07-12 NOTE — Care Management Important Message (Signed)
Important Message  Patient Details  Name: Chase Simmons MRN: [id number] Date of Birth: 06-Aug-1933   Medicare Important Message Given:  Yes Signed IM notice given   Katrina Stack, RN 07/12/2016, 2:11 PM

## 2016-07-12 NOTE — Clinical Social Work Placement (Addendum)
   CLINICAL SOCIAL WORK PLACEMENT  NOTE  Date:  07/12/2016  Patient Details  Name: Chase Simmons MRN: [id number] Date of Birth: Jun 06, 1933  Clinical Social Work is seeking post-discharge placement for this patient at the [place] level of care (*CSW will initial, date and re-position this form in  chart as items are completed):  Yes   Patient/family provided with [facility] Work Department's list of facilities offering this level of care within the geographic area requested by the patient (or if unable, by the patient's family).  Yes   Patient/family informed of their freedom to choose among providers that offer the needed level of care, that participate in Medicare, Medicaid or managed care program needed by the patient, have an available bed and are willing to accept the patient.  Yes   Patient/family informed of [facility]'s ownership interest in [facility] and [facility], as well as of the fact that they are under no obligation to receive care at these facilities.  PASRR submitted to EDS on 07/11/16     PASRR number received on       Existing PASRR number confirmed on 07/11/16     FL2 transmitted to all facilities in geographic area requested by pt/family on 07/11/16     FL2 transmitted to all facilities within larger geographic area on       Patient informed that his/her managed care company has contracts with or will negotiate with certain facilities, including the following:         07-12-16   Patient/family informed of bed offers received. (Updated 07-13-16 Evette Cristal, MSW, [place])  Patient chooses bed at  [facility] [place] (Updated 07-13-16 Evette Cristal, MSW, [facility])     Physician recommends and patient chooses bed at      Patient to be transferred to   [facility] [place] (Updated 07-13-16 Evette Cristal, MSW, LCSWA) Patient to be transferred to facility by   [facility] (Updated 07-13-16 Evette Cristal, MSW, [place])    Patient family notified on  07-13-16 of transfer. (Updated 07-13-16 Evette Cristal, MSW, [place])  Name of family member notified:   Marc Leichter daughter [phone] (Updated 07-13-16 Evette Cristal, MSW, LCSWA)     PHYSICIAN Please sign FL2     Additional Comment:    _______________________________________________ Ross Ludwig, LCSWA 07/12/2016, 10:07 AM

## 2016-07-12 NOTE — Progress Notes (Addendum)
[place] [facility] [place] NAME: Chase Simmons    MR#:  [id number]  DATE OF BIRTH:  1933/09/18  SUBJECTIVE:  CHIEF COMPLAINT:   Chief Complaint  Patient presents with  . Emesis  . Weakness   No chest pain but generalized weakness. Diarrhea last night, due to stool softener given last night. REVIEW OF SYSTEMS:  Review of Systems  Constitutional: Positive for malaise/fatigue. Negative for chills and fever.  HENT: Negative for sore throat.   Eyes: Negative for blurred vision and double vision.  Respiratory: Negative for cough, shortness of breath, wheezing and stridor.   Cardiovascular: Negative for chest pain and leg swelling.  Gastrointestinal: Negative for abdominal pain, blood in stool, diarrhea, melena, nausea and vomiting.  Genitourinary: Negative for dysuria and hematuria.  Musculoskeletal: Negative for back pain.  Neurological: Positive for weakness. Negative for dizziness, focal weakness, loss of consciousness and headaches.  Psychiatric/Behavioral: Negative for depression. The patient is not nervous/anxious.     DRUG ALLERGIES:   Allergies  Allergen Reactions  . Latex Itching   VITALS:  Blood pressure (!) 112/56, pulse 62, temperature 97.9 F (36.6 C), resp. rate 19, height 5\' 11"  (1.803 m), weight 238 lb 15.7 oz (108.4 kg), SpO2 90 %. PHYSICAL EXAMINATION:  Physical Exam  Constitutional: He is oriented to person, place, and time and well-developed, well-nourished, and in no distress.  HENT:  Head: Normocephalic.  Mouth/Throat: Oropharynx is clear and moist.  Eyes: Conjunctivae and EOM are normal. No scleral icterus.  Neck: Normal range of motion. Neck supple. No JVD present. No tracheal deviation present.  Cardiovascular: Normal rate, regular rhythm and normal heart sounds.  Exam reveals no gallop.   No murmur heard. Pulmonary/Chest: Effort normal and breath sounds normal. No respiratory distress. He has no wheezes. He has  no rales.  Abdominal: Soft. Bowel sounds are normal. He exhibits no distension. There is no tenderness. There is no rebound.  Musculoskeletal: Normal range of motion. He exhibits no edema or tenderness.  Neurological: He is alert and oriented to person, place, and time. No cranial nerve deficit.  Skin: No rash noted. No erythema.  Psychiatric: Affect normal.   LABORATORY PANEL:  Male CBC  Recent Labs Lab 07/11/16 1436  WBC 10.5  HGB 13.8  HCT 39.9*  PLT 170   ------------------------------------------------------------------------------------------------------------------ Chemistries   Recent Labs Lab 07/10/16 0416  07/11/16 0305 07/11/16 1436  NA 140  --  142  --   K 4.2  --  4.1  --   CL 101  --  105  --   CO2 29  --  29  --   GLUCOSE 225*  --  136*  --   BUN 27*  --  24*  --   CREATININE 1.05  < > 1.10 0.96  CALCIUM 9.4  --  9.4  --   MG 1.7  --   --   --   AST 27  --   --   --   ALT 22  --   --   --   ALKPHOS 71  --   --   --   BILITOT 0.6  --   --   --   < > = values in this interval not displayed. RADIOLOGY:  No results found. ASSESSMENT AND PLAN:   NSTEMI, stress-induced cardiomyopathy Troponin is trending up, 0.10-> 0.88->1.32. Treated with aspirin 325 mg by mouth, started heparin drip PTD. On Lipitor, Coreg 3.125mg  BID and  Lisinopril 10mg  daily Cardiac cath: Mild nonobstructive coronary artery disease. Severely reduced LV systolic function with an EF of 25-30%. Wall motion abnormalities suggestive of stress-induced cardiomyopathy. Mildly elevated left ventricular end-diastolic pressure. Echo: wall motion abnormalities are suggestive of stress induced vs.   ischemic cardiomyopathy. EF 30-35%.  Intractable nausea and vomiting. zofran prn. Improved.  History of chronic A. fib, now in sinus rhythm. Not on beta blocker given bradycardic rates with history of possible AV dissociation. Per cardiology consult, started on Coreg 3.125mg  BID in the setting of  his cardiomyopathy.   Dehydration. Improving with normal saline IV. Discontinue NS iv. Hypertension. Continue home hypertension medication. Diabetes. Hold metformin and glimepiride, on sliding scale. COPD. Stable. NEB when necessary.  Discussed with Dr. Rockey Simmons. Generalized weakness. PT evaluation: SNF. Waiting for insurance authorization.  All the records are reviewed and case discussed with Care Management/Social Worker. Management plans discussed with the patient, his daughter and they are in agreement.  CODE STATUS: Full Code  TOTAL TIME TAKING CARE OF THIS PATIENT: 33 minutes.   More than 50% of the time was spent in counseling/coordination of care: YES  POSSIBLE D/C IN 1 DAYS, DEPENDING ON CLINICAL CONDITION.   Chase Simmons M.D on 07/12/2016 at 3:57 PM  Between 7am to 6pm - Pager - [phone]  After 6pm go to [email or web address] - Proofreader  [facility]  Office  [phone]  CC: Primary care physician; Chase Spikes, DO  Note: This dictation was prepared with Dragon dictation along with smaller phrase technology. Any transcriptional errors that result from this process are unintentional.

## 2016-07-12 NOTE — Telephone Encounter (Signed)
Tcm... Patient seen Dr Fletcher Anon in hospital  Being discharged today  Has seen Dr Yvone Neu in past. He is coming on 08/08/16 to see Dr Fletcher Anon.

## 2016-07-12 NOTE — Plan of Care (Signed)
Problem: Safety: Goal: Ability to remain free from injury will improve Outcome: Progressing Bed alarm on, non skid socks on, patient and family educated to call for assistance. Hourly rounding completed on patient. Will continue to monitor.

## 2016-07-12 NOTE — Progress Notes (Signed)
Central telemetry reported patient having 2 second pauses and brady as low as 30s but mainly brady in 40-50s. MD notified. Cardiology notified. Patient sleeping through episodes. Per cardiology ok to give carvedilol since patient HR maintaining in 60s when awake and BP is stable. Will continue to monitor.

## 2016-07-12 NOTE — Telephone Encounter (Signed)
Currently admitted. Will attempt for sooner appt and call pt after discharge.

## 2016-07-12 NOTE — Clinical Social Work Note (Signed)
CSW presented bed offers to patient and his daughter they have chosen [facility].  CSW contacted [facility], they have began insurance authorization, and can accept patient once insurance has improved.  CSW to continue to follow patient's progress throughout discharge planning.  Jones Broom. [place], MSW, [place]  07/12/2016 12:28 PM

## 2016-07-12 NOTE — Clinical Social Work Note (Addendum)
Clinical Social Work Assessment  Patient Details  Name: Chase Simmons MRN: [id number] Date of Birth: [birth year]  Date of referral:  07/11/16               Reason for consult:  Facility Placement                Permission sought to share information with:  Facility Sport and exercise psychologist, Family Supports Permission granted to share information::  Yes, Verbal Permission Granted  Name::     [facility] Daughter [phone] or [place], [place] [phone]   Agency::  SNF admissions  Relationship::     Contact Information:     Housing/Transportation Living arrangements for the past 2 months:  Single Family Home Source of Information:  Adult Children Patient Interpreter Needed:  None Criminal Activity/Legal Involvement Pertinent to Current Situation/Hospitalization:  No - Comment as needed Significant Relationships:  Adult Children Lives with:  Self Do you feel safe going back to the place where you live?  No Need for family participation in patient care:  Yes (Comment)  Care giving concerns:  Patient's family feels he needs some short term rehab before he is able to return back home.   Social Worker assessment / plan:  Patient is an 83 year old male who is alert and oriented x1 currently.  Patient lives alone in a house, patient was a little confused because he just arrived back from procedure.  Patient is widowed, and patient's daughter [name] [phone] checks on him regularly, however she is not able to provide 24/7 support at this time.  Patient has been to rehab in the past, CSW explained to patient and daughter regarding SNF placement process and options.   CSW discussed with patient's daughter different types of Medicaid and what the process is for applying for Medicaid.  CSW also spoke to patient about option for ALFs verse long term care SNF placement.  CSW explained to patient's daughter that depending on what PT and OT says will determine if insurance will approve  patient to go to SNF.  CSW informed patient's daughter, that if patient is not approved for SNF from insurance company she will have to look at either paying privately or having patient go home with home health.  Patient's daughter is aware of possiblity, she gave CSW permission to begin ed search in [place].  Patient and daughter did not express any other questions or concerns.   Employment status:  Retired Nurse, adult PT Recommendations:  [place] / Referral to community resources:  [place]  Patient/Family's Response to care:  Patient and family in agreement to going to SNF for short term rehab.  Patient/Family's Understanding of and Emotional Response to Diagnosis, Current Treatment, and Prognosis:  Patient's family is aware of current diagnosis and treatment plan.  Patient and his family are hopeful he will not have to be in SNF very long.   Emotional Assessment Appearance:  Appears stated age Attitude/Demeanor/Rapport:    Affect (typically observed):  Appropriate, Restless, Stable Orientation:  Oriented to Self Alcohol / Substance use:  Not Applicable Psych involvement (Current and /or in the community):  No (Comment)  Discharge Needs  Concerns to be addressed:  Lack of Support Readmission within the last 30 days:  No Current discharge risk:  Lack of support system, Lives alone Barriers to Discharge:  Ship broker, Continued Medical Work up   [employer] 07/12/2016, 9:57 AM

## 2016-07-13 DIAGNOSIS — I481 Persistent atrial fibrillation: Secondary | ICD-10-CM

## 2016-07-13 DIAGNOSIS — G250 Essential tremor: Secondary | ICD-10-CM | POA: Diagnosis not present

## 2016-07-13 DIAGNOSIS — R778 Other specified abnormalities of plasma proteins: Secondary | ICD-10-CM | POA: Diagnosis not present

## 2016-07-13 DIAGNOSIS — F419 Anxiety disorder, unspecified: Secondary | ICD-10-CM | POA: Diagnosis not present

## 2016-07-13 DIAGNOSIS — M6281 Muscle weakness (generalized): Secondary | ICD-10-CM | POA: Diagnosis not present

## 2016-07-13 DIAGNOSIS — F329 Major depressive disorder, single episode, unspecified: Secondary | ICD-10-CM | POA: Diagnosis not present

## 2016-07-13 DIAGNOSIS — I2510 Atherosclerotic heart disease of native coronary artery without angina pectoris: Secondary | ICD-10-CM | POA: Diagnosis not present

## 2016-07-13 DIAGNOSIS — K5900 Constipation, unspecified: Secondary | ICD-10-CM | POA: Diagnosis not present

## 2016-07-13 DIAGNOSIS — I252 Old myocardial infarction: Secondary | ICD-10-CM | POA: Diagnosis not present

## 2016-07-13 DIAGNOSIS — R001 Bradycardia, unspecified: Secondary | ICD-10-CM | POA: Diagnosis not present

## 2016-07-13 DIAGNOSIS — E1165 Type 2 diabetes mellitus with hyperglycemia: Secondary | ICD-10-CM | POA: Diagnosis not present

## 2016-07-13 DIAGNOSIS — R197 Diarrhea, unspecified: Secondary | ICD-10-CM

## 2016-07-13 DIAGNOSIS — I420 Dilated cardiomyopathy: Secondary | ICD-10-CM

## 2016-07-13 DIAGNOSIS — R6889 Other general symptoms and signs: Secondary | ICD-10-CM | POA: Diagnosis not present

## 2016-07-13 DIAGNOSIS — I5022 Chronic systolic (congestive) heart failure: Secondary | ICD-10-CM | POA: Diagnosis not present

## 2016-07-13 DIAGNOSIS — I480 Paroxysmal atrial fibrillation: Secondary | ICD-10-CM | POA: Diagnosis not present

## 2016-07-13 DIAGNOSIS — Z8551 Personal history of malignant neoplasm of bladder: Secondary | ICD-10-CM | POA: Diagnosis not present

## 2016-07-13 DIAGNOSIS — R110 Nausea: Secondary | ICD-10-CM | POA: Diagnosis not present

## 2016-07-13 DIAGNOSIS — K219 Gastro-esophageal reflux disease without esophagitis: Secondary | ICD-10-CM | POA: Diagnosis not present

## 2016-07-13 DIAGNOSIS — R112 Nausea with vomiting, unspecified: Secondary | ICD-10-CM | POA: Diagnosis not present

## 2016-07-13 DIAGNOSIS — R0602 Shortness of breath: Secondary | ICD-10-CM | POA: Diagnosis not present

## 2016-07-13 DIAGNOSIS — J449 Chronic obstructive pulmonary disease, unspecified: Secondary | ICD-10-CM | POA: Diagnosis not present

## 2016-07-13 DIAGNOSIS — K8689 Other specified diseases of pancreas: Secondary | ICD-10-CM | POA: Diagnosis not present

## 2016-07-13 DIAGNOSIS — R32 Unspecified urinary incontinence: Secondary | ICD-10-CM | POA: Diagnosis not present

## 2016-07-13 DIAGNOSIS — Z91040 Latex allergy status: Secondary | ICD-10-CM | POA: Diagnosis not present

## 2016-07-13 DIAGNOSIS — I214 Non-ST elevation (NSTEMI) myocardial infarction: Secondary | ICD-10-CM | POA: Diagnosis not present

## 2016-07-13 DIAGNOSIS — E784 Other hyperlipidemia: Secondary | ICD-10-CM | POA: Diagnosis not present

## 2016-07-13 DIAGNOSIS — I10 Essential (primary) hypertension: Secondary | ICD-10-CM | POA: Diagnosis not present

## 2016-07-13 DIAGNOSIS — R69 Illness, unspecified: Secondary | ICD-10-CM | POA: Diagnosis not present

## 2016-07-13 DIAGNOSIS — I110 Hypertensive heart disease with heart failure: Secondary | ICD-10-CM | POA: Diagnosis not present

## 2016-07-13 DIAGNOSIS — R0789 Other chest pain: Secondary | ICD-10-CM | POA: Diagnosis present

## 2016-07-13 DIAGNOSIS — R61 Generalized hyperhidrosis: Secondary | ICD-10-CM | POA: Diagnosis not present

## 2016-07-13 DIAGNOSIS — Z5189 Encounter for other specified aftercare: Secondary | ICD-10-CM | POA: Diagnosis not present

## 2016-07-13 DIAGNOSIS — E119 Type 2 diabetes mellitus without complications: Secondary | ICD-10-CM | POA: Diagnosis not present

## 2016-07-13 DIAGNOSIS — R1084 Generalized abdominal pain: Secondary | ICD-10-CM | POA: Diagnosis not present

## 2016-07-13 DIAGNOSIS — I4589 Other specified conduction disorders: Secondary | ICD-10-CM | POA: Diagnosis not present

## 2016-07-13 DIAGNOSIS — J309 Allergic rhinitis, unspecified: Secondary | ICD-10-CM | POA: Diagnosis not present

## 2016-07-13 DIAGNOSIS — E118 Type 2 diabetes mellitus with unspecified complications: Secondary | ICD-10-CM | POA: Diagnosis not present

## 2016-07-13 DIAGNOSIS — Z7401 Bed confinement status: Secondary | ICD-10-CM | POA: Diagnosis not present

## 2016-07-13 DIAGNOSIS — K5730 Diverticulosis of large intestine without perforation or abscess without bleeding: Secondary | ICD-10-CM | POA: Diagnosis not present

## 2016-07-13 DIAGNOSIS — E785 Hyperlipidemia, unspecified: Secondary | ICD-10-CM | POA: Diagnosis not present

## 2016-07-13 DIAGNOSIS — I700 Atherosclerosis of aorta: Secondary | ICD-10-CM | POA: Diagnosis not present

## 2016-07-13 LAB — GLUCOSE, CAPILLARY
GLUCOSE-CAPILLARY: 104 mg/dL — AB (ref 65–99)
Glucose-Capillary: 132 mg/dL — ABNORMAL HIGH (ref 65–99)

## 2016-07-13 MED ORDER — CARVEDILOL 3.125 MG PO TABS: 3.1250 mg | ORAL_TABLET | Freq: Two times a day (BID) | ORAL | 0 refills | Status: DC

## 2016-07-13 MED ORDER — LISINOPRIL 10 MG PO TABS: 10.0000 mg | ORAL_TABLET | Freq: Every day | ORAL | 0 refills | Status: DC

## 2016-07-13 NOTE — Clinical Social Work Note (Addendum)
CSW was informed by [facility] that they have received insurance authorization for patient to discharge today if he is medically ready for discharge and orders have been received.  CSW updated bedside nurse, physician, and patient's daughter Revanth Neidig, [phone].  CSW to continue to follow patient's progress throughout discharge planning.  11:30am  Patient to be d/c'ed today to [facility] [place].  Patient and family agreeable to plans will transport via ems RN to call report to [phone] room [place].  Jones Broom. [place], MSW, [place]  07/13/2016 11:39 AM

## 2016-07-13 NOTE — Telephone Encounter (Signed)
Patient currently admitted at this time. 

## 2016-07-13 NOTE — Plan of Care (Signed)
Problem: Safety: Goal: Ability to remain free from injury will improve Outcome: Progressing Bed alarm in place, non skid socks on, call bell within reach, encouraged to call before getting out of bed.  Problem: Pain Managment: Goal: General experience of comfort will improve Outcome: Progressing Pt complained of pain once this morning on his buttocks, says from laying on it so long, sat pt up on side of bed & gave tylenol, which gave relief. Will continue to monitor.

## 2016-07-13 NOTE — Progress Notes (Signed)
IV and tele removed from patient. Report called to [facility]. EMS called for transportation. No distress at this time.

## 2016-07-13 NOTE — Discharge Summary (Signed)
[place] at [place] NAME: Jamareon Shimel    MR#:  [id number]  DATE OF BIRTH:  Jun 06, 1933  DATE OF ADMISSION:  07/10/2016 ADMITTING PHYSICIAN: Saundra Shelling, MD  DATE OF DISCHARGE: 07/13/2016  PRIMARY CARE PHYSICIAN: Coral Spikes, DO    ADMISSION DIAGNOSIS:  Elevated troponin I level [R74.8] Generalized weakness [R53.1] Non-intractable vomiting with nausea, unspecified vomiting type [R11.2]  DISCHARGE DIAGNOSIS:  Principal Problem:   Nausea & vomiting Active Problems:   Elevated troponin   Diarrhea   Persistent atrial fibrillation ([place])   Ischemic cardiomyopathy   SECONDARY DIAGNOSIS:   Past Medical History:  Diagnosis Date  . Allergy   . Atrial fibrillation ([place]) 08/30/2013  . Cancer [facility]) pt unsure of year   bladder cancer-Dr. Jacqlyn Larsen  . COPD (chronic obstructive pulmonary disease) ([place])   . Coronary atherosclerosis of native coronary artery 01/05/2016  . Hyperlipidemia   . Hypertension   . Tremor, essential    sees [facility] neurologist  . Type 2 diabetes mellitus with complication, without long-term current use of insulin ([employer]) 08/01/2012  . Urinary incontinence     HOSPITAL COURSE:   * stress-induced cardiomyopathy- ac systolic CHF Troponin is trending up, 0.10-> 0.88->1.32.- NSTEMI ruled out by negative cath. Treated withaspirin 325 mg by mouth, started heparin drip PTD. On Lipitor, Coreg 3.125mg  BID and Lisinopril 10mg  daily Cardiac cath: Mild nonobstructive coronary artery disease. Severely reduced LV systolic function with an EF of 25-30%. Wall motion abnormalities suggestive of stress-induced cardiomyopathy. Mildly elevated left ventricular end-diastolic pressure. Echo: wall motion abnormalities are suggestive of stress induced vs. ischemic cardiomyopathy. EF 30-35%.   Pt had Bradycardia while sleep- but cardiologist suggested to continue betablocker.  * Intractable nausea and vomiting. zofran prn.  Improved.  * History of chronic A. fib, now in sinus rhythm. Not on beta blocker given bradycardic rates with history of possible AV dissociation. Per cardiology consult, started on Coreg 3.125mg  BID in the setting of his cardiomyopathy.   * Dehydration. Improving with normal saline IV. Discontinue NS iv. * Hypertension. Continue home hypertension medication. * Diabetes. Hold metformin and glimepiride, on sliding scale. * COPD. Stable. NEB when necessary.  Discussed with Dr. Rockey Situ.  DISCHARGE CONDITIONS:   Stable.  CONSULTS OBTAINED:  Treatment Team:  Burnell Blanks, MD  DRUG ALLERGIES:   Allergies  Allergen Reactions  . Latex Itching    DISCHARGE MEDICATIONS:   Current Discharge Medication List    START taking these medications   Details  carvedilol (COREG) 3.125 MG tablet Take 1 tablet (3.125 mg total) by mouth 2 (two) times daily with a meal. Qty: 60 tablet, Refills: 0      CONTINUE these medications which have CHANGED   Details  lisinopril (PRINIVIL,ZESTRIL) 10 MG tablet Take 1 tablet (10 mg total) by mouth daily. Qty: 30 tablet, Refills: 0      CONTINUE these medications which have NOT CHANGED   Details  atorvastatin (LIPITOR) 80 MG tablet TAKE 1 TABLET ONE TIME DAILY Qty: 90 tablet, Refills: 3    glimepiride (AMARYL) 4 MG tablet take 1 tablet by mouth WITH BREAKFAST. Qty: 90 tablet, Refills: 1    PARoxetine (PAXIL) 40 MG tablet take 1 tablet by mouth once daily Qty: 30 tablet, Refills: 0    primidone (MYSOLINE) 50 MG tablet Take 1.5 tablets (75 mg total) by mouth 2 (two) times daily. Qty: 270 tablet, Refills: 1    acetaminophen (TYLENOL) 325 MG tablet Take 2  tablets (650 mg total) by mouth every 6 (six) hours as needed for mild pain (or Fever >/= 101).    ALPRAZolam (XANAX) 0.25 MG tablet take 1 tablet by mouth every 12 hours if needed for anxiety Qty: 60 tablet, Refills: 1    aspirin EC 81 MG tablet Take 1 tablet (81 mg total) by mouth  daily. Qty: 90 tablet, Refills: 3    ipratropium-albuterol (DUONEB) 0.5-2.5 (3) MG/3ML SOLN Take 3 mLs by nebulization every 6 (six) hours as needed. Qty: 360 mL    tiotropium (SPIRIVA) 18 MCG inhalation capsule Place 1 capsule (18 mcg total) into inhaler and inhale daily. Qty: 30 capsule, Refills: 5      STOP taking these medications     metFORMIN (GLUCOPHAGE) 1000 MG tablet      ondansetron (ZOFRAN) 4 MG tablet          DISCHARGE INSTRUCTIONS:    Follow with cardiology clinic in 1-2 weeks.  If you experience worsening of your admission symptoms, develop shortness of breath, life threatening emergency, suicidal or homicidal thoughts you must seek medical attention immediately by calling 911 or calling your MD immediately  if symptoms less severe.  You Must read complete instructions/literature along with all the possible adverse reactions/side effects for all the Medicines you take and that have been prescribed to you. Take any new Medicines after you have completely understood and accept all the possible adverse reactions/side effects.   Please note  You were cared for by a hospitalist during your hospital stay. If you have any questions about your discharge medications or the care you received while you were in the hospital after you are discharged, you can call the unit and asked to speak with the hospitalist on call if the hospitalist that took care of you is not available. Once you are discharged, your primary care physician will handle any further medical issues. Please note that NO REFILLS for any discharge medications will be authorized once you are discharged, as it is imperative that you return to your primary care physician (or establish a relationship with a primary care physician if you do not have one) for your aftercare needs so that they can reassess your need for medications and monitor your lab values.    Today   CHIEF COMPLAINT:   Chief Complaint  Patient  presents with  . Emesis  . Weakness    HISTORY OF PRESENT ILLNESS:  Cavan Bearden  is a 83 y.o. male with a known history of Hypertension, diabetes, CAD, COPD, hyperlipidemia and bladder cancer. The patient was in his normal status when he went to bed but woke up feeling sick to his stomach. He has had at least 4 episodes of intractable nausea and vomiting. The patient denies any abdominal pain, chest pain, palpitation or diaphoresis. He denies any fever, chills, dysuria or hematuria. He feels generalized weakness. He was found elevated troponin in the ED. He said he has history of bradycardia and was told by cardiologist that he may need a pacemaker.  VITAL SIGNS:  Blood pressure 132/80, pulse 82, temperature 98.2 F (36.8 C), temperature source Oral, resp. rate 17, height 5\' 11"  (1.803 m), weight 108.4 kg (238 lb 15.7 oz), SpO2 98 %.  I/O:   Intake/Output Summary (Last 24 hours) at 07/13/16 1144 Last data filed at 07/13/16 0944  Gross per 24 hour  Intake              843 ml  Output  700 ml  Net              143 ml    PHYSICAL EXAMINATION:   Constitutional: He is oriented to person, place, and time and well-developed, well-nourished, and in no distress.  HENT:  Head: Normocephalic.  Mouth/Throat: Oropharynx is clear and moist.  Eyes: Conjunctivae and EOM are normal. No scleral icterus.  Neck: Normal range of motion. Neck supple. No JVD present. No tracheal deviation present.  Cardiovascular: Normal rate, regular rhythm and normal heart sounds.  Exam reveals no gallop.   No murmur heard. Pulmonary/Chest: Effort normal and breath sounds normal. No respiratory distress. He has no wheezes. He has no rales.  Abdominal: Soft. Bowel sounds are normal. He exhibits no distension. There is no tenderness. There is no rebound.  Musculoskeletal: Normal range of motion. He exhibits no edema or tenderness.  Neurological: He is alert and oriented to person, place, and time. No cranial  nerve deficit.  Skin: No rash noted. No erythema.  Psychiatric: Affect normal.   DATA REVIEW:   CBC  Recent Labs Lab 07/11/16 1436  WBC 10.5  HGB 13.8  HCT 39.9*  PLT 170    Chemistries   Recent Labs Lab 07/10/16 0416  07/11/16 0305 07/11/16 1436  NA 140  --  142  --   K 4.2  --  4.1  --   CL 101  --  105  --   CO2 29  --  29  --   GLUCOSE 225*  --  136*  --   BUN 27*  --  24*  --   CREATININE 1.05  < > 1.10 0.96  CALCIUM 9.4  --  9.4  --   MG 1.7  --   --   --   AST 27  --   --   --   ALT 22  --   --   --   ALKPHOS 71  --   --   --   BILITOT 0.6  --   --   --   < > = values in this interval not displayed.  Cardiac Enzymes  Recent Labs Lab 07/10/16 2122  TROPONINI 0.93*    Microbiology Results  Results for orders placed or performed during the hospital encounter of 07/27/15  Urine culture     Status: Abnormal   Collection Time: 07/27/15  6:32 PM  Result Value Ref Range Status   Specimen Description URINE, CLEAN CATCH  Final   Special Requests NONE  Final   Culture >=100,000 COLONIES/mL ESCHERICHIA COLI (A)  Final   Report Status 07/30/2015 FINAL  Final   Organism ID, Bacteria ESCHERICHIA COLI (A)  Final      Susceptibility   Escherichia coli - MIC*    AMPICILLIN 4 SENSITIVE Sensitive     CEFAZOLIN <=4 SENSITIVE Sensitive     CEFTRIAXONE <=1 SENSITIVE Sensitive     CIPROFLOXACIN <=0.25 SENSITIVE Sensitive     GENTAMICIN <=1 SENSITIVE Sensitive     IMIPENEM <=0.25 SENSITIVE Sensitive     NITROFURANTOIN <=16 SENSITIVE Sensitive     TRIMETH/SULFA <=20 SENSITIVE Sensitive     AMPICILLIN/SULBACTAM 4 SENSITIVE Sensitive     PIP/TAZO <=4 SENSITIVE Sensitive     * >=100,000 COLONIES/mL ESCHERICHIA COLI    RADIOLOGY:  No results found.  EKG:   Orders placed or performed during the hospital encounter of 07/10/16  . ED EKG  . ED EKG  . EKG 12-Lead  . EKG 12-Lead  Management plans discussed with the patient, family and they are in  agreement.  CODE STATUS:     Code Status Orders        Start     Ordered   07/10/16 0935  Full code  Continuous     07/10/16 0934    Code Status History    Date Active Date Inactive Code Status Order ID Comments User Context   06/19/2015 12:21 AM 06/23/2015 12:47 AM Full Code [id number]  Alesia Richards, MD ED   06/04/2015  8:13 PM 06/08/2015  6:33 PM Full Code [id number]  Loletha Grayer, MD ED      TOTAL TIME TAKING CARE OF THIS PATIENT: 35 minutes.    Vaughan Basta M.D on 07/13/2016 at 11:44 AM  Between 7am to 6pm - Pager - [phone]  After 6pm go to [email or web address] - password [email or web address]  Office  [phone]  CC: Primary care physician; Coral Spikes, DO   Note: This dictation was prepared with Dragon dictation along with smaller phrase technology. Any transcriptional errors that result from this process are unintentional.

## 2016-07-16 DIAGNOSIS — J449 Chronic obstructive pulmonary disease, unspecified: Secondary | ICD-10-CM | POA: Diagnosis not present

## 2016-07-16 DIAGNOSIS — E119 Type 2 diabetes mellitus without complications: Secondary | ICD-10-CM | POA: Diagnosis not present

## 2016-07-16 DIAGNOSIS — G250 Essential tremor: Secondary | ICD-10-CM | POA: Diagnosis not present

## 2016-07-16 DIAGNOSIS — E785 Hyperlipidemia, unspecified: Secondary | ICD-10-CM | POA: Diagnosis not present

## 2016-07-16 DIAGNOSIS — K5900 Constipation, unspecified: Secondary | ICD-10-CM | POA: Diagnosis not present

## 2016-07-16 DIAGNOSIS — I10 Essential (primary) hypertension: Secondary | ICD-10-CM | POA: Diagnosis not present

## 2016-07-16 DIAGNOSIS — M6281 Muscle weakness (generalized): Secondary | ICD-10-CM | POA: Diagnosis not present

## 2016-07-16 DIAGNOSIS — R69 Illness, unspecified: Secondary | ICD-10-CM | POA: Diagnosis not present

## 2016-07-19 NOTE — Telephone Encounter (Signed)
Left voicemail message to call back  

## 2016-07-20 ENCOUNTER — Observation Stay
Admission: EM | Admit: 2016-07-20 | Discharge: 2016-07-25 | Disposition: A | Discharge status: Skilled Nursing Facility | Payer: Medicare HMO | Attending: Internal Medicine | Admitting: Internal Medicine

## 2016-07-20 ENCOUNTER — Emergency Department: Payer: Medicare HMO

## 2016-07-20 ENCOUNTER — Encounter: Payer: Self-pay | Admitting: Emergency Medicine

## 2016-07-20 ENCOUNTER — Encounter: Payer: Self-pay | Admitting: *Deleted

## 2016-07-20 DIAGNOSIS — E785 Hyperlipidemia, unspecified: Secondary | ICD-10-CM | POA: Diagnosis not present

## 2016-07-20 DIAGNOSIS — I110 Hypertensive heart disease with heart failure: Secondary | ICD-10-CM | POA: Insufficient documentation

## 2016-07-20 DIAGNOSIS — J449 Chronic obstructive pulmonary disease, unspecified: Secondary | ICD-10-CM | POA: Insufficient documentation

## 2016-07-20 DIAGNOSIS — K219 Gastro-esophageal reflux disease without esophagitis: Secondary | ICD-10-CM | POA: Insufficient documentation

## 2016-07-20 DIAGNOSIS — Z8551 Personal history of malignant neoplasm of bladder: Secondary | ICD-10-CM | POA: Diagnosis not present

## 2016-07-20 DIAGNOSIS — R0789 Other chest pain: Principal | ICD-10-CM | POA: Diagnosis present

## 2016-07-20 DIAGNOSIS — N281 Cyst of kidney, acquired: Secondary | ICD-10-CM | POA: Insufficient documentation

## 2016-07-20 DIAGNOSIS — I5022 Chronic systolic (congestive) heart failure: Secondary | ICD-10-CM | POA: Diagnosis not present

## 2016-07-20 DIAGNOSIS — I252 Old myocardial infarction: Secondary | ICD-10-CM | POA: Insufficient documentation

## 2016-07-20 DIAGNOSIS — Z87891 Personal history of nicotine dependence: Secondary | ICD-10-CM | POA: Insufficient documentation

## 2016-07-20 DIAGNOSIS — I700 Atherosclerosis of aorta: Secondary | ICD-10-CM | POA: Insufficient documentation

## 2016-07-20 DIAGNOSIS — K8020 Calculus of gallbladder without cholecystitis without obstruction: Secondary | ICD-10-CM | POA: Insufficient documentation

## 2016-07-20 DIAGNOSIS — G250 Essential tremor: Secondary | ICD-10-CM | POA: Insufficient documentation

## 2016-07-20 DIAGNOSIS — J309 Allergic rhinitis, unspecified: Secondary | ICD-10-CM | POA: Insufficient documentation

## 2016-07-20 DIAGNOSIS — I2510 Atherosclerotic heart disease of native coronary artery without angina pectoris: Secondary | ICD-10-CM | POA: Insufficient documentation

## 2016-07-20 DIAGNOSIS — Z7982 Long term (current) use of aspirin: Secondary | ICD-10-CM | POA: Insufficient documentation

## 2016-07-20 DIAGNOSIS — R61 Generalized hyperhidrosis: Secondary | ICD-10-CM | POA: Diagnosis not present

## 2016-07-20 DIAGNOSIS — R52 Pain, unspecified: Secondary | ICD-10-CM

## 2016-07-20 DIAGNOSIS — R32 Unspecified urinary incontinence: Secondary | ICD-10-CM | POA: Insufficient documentation

## 2016-07-20 DIAGNOSIS — I480 Paroxysmal atrial fibrillation: Secondary | ICD-10-CM | POA: Insufficient documentation

## 2016-07-20 DIAGNOSIS — R0602 Shortness of breath: Secondary | ICD-10-CM | POA: Diagnosis not present

## 2016-07-20 DIAGNOSIS — R1084 Generalized abdominal pain: Secondary | ICD-10-CM | POA: Diagnosis not present

## 2016-07-20 DIAGNOSIS — I481 Persistent atrial fibrillation: Secondary | ICD-10-CM | POA: Diagnosis not present

## 2016-07-20 DIAGNOSIS — I10 Essential (primary) hypertension: Secondary | ICD-10-CM | POA: Diagnosis not present

## 2016-07-20 DIAGNOSIS — R001 Bradycardia, unspecified: Secondary | ICD-10-CM | POA: Insufficient documentation

## 2016-07-20 DIAGNOSIS — K5730 Diverticulosis of large intestine without perforation or abscess without bleeding: Secondary | ICD-10-CM | POA: Insufficient documentation

## 2016-07-20 DIAGNOSIS — Z794 Long term (current) use of insulin: Secondary | ICD-10-CM | POA: Insufficient documentation

## 2016-07-20 DIAGNOSIS — E118 Type 2 diabetes mellitus with unspecified complications: Secondary | ICD-10-CM | POA: Insufficient documentation

## 2016-07-20 DIAGNOSIS — F329 Major depressive disorder, single episode, unspecified: Secondary | ICD-10-CM | POA: Insufficient documentation

## 2016-07-20 DIAGNOSIS — F419 Anxiety disorder, unspecified: Secondary | ICD-10-CM | POA: Insufficient documentation

## 2016-07-20 DIAGNOSIS — R778 Other specified abnormalities of plasma proteins: Secondary | ICD-10-CM | POA: Insufficient documentation

## 2016-07-20 DIAGNOSIS — R110 Nausea: Secondary | ICD-10-CM | POA: Insufficient documentation

## 2016-07-20 DIAGNOSIS — Z91040 Latex allergy status: Secondary | ICD-10-CM | POA: Insufficient documentation

## 2016-07-20 DIAGNOSIS — Z9849 Cataract extraction status, unspecified eye: Secondary | ICD-10-CM | POA: Insufficient documentation

## 2016-07-20 DIAGNOSIS — R112 Nausea with vomiting, unspecified: Secondary | ICD-10-CM | POA: Diagnosis not present

## 2016-07-20 DIAGNOSIS — Z79899 Other long term (current) drug therapy: Secondary | ICD-10-CM | POA: Insufficient documentation

## 2016-07-20 DIAGNOSIS — I4589 Other specified conduction disorders: Secondary | ICD-10-CM | POA: Insufficient documentation

## 2016-07-20 DIAGNOSIS — N400 Enlarged prostate without lower urinary tract symptoms: Secondary | ICD-10-CM | POA: Insufficient documentation

## 2016-07-20 LAB — CBC WITH DIFFERENTIAL/PLATELET
Basophils Absolute: 0.1 10*3/uL (ref 0–0.1)
Basophils Relative: 1 %
EOS PCT: 4 %
Eosinophils Absolute: 0.3 10*3/uL (ref 0–0.7)
HCT: 37.2 % — ABNORMAL LOW (ref 40.0–52.0)
Hemoglobin: 12.9 g/dL — ABNORMAL LOW (ref 13.0–18.0)
LYMPHS ABS: 1.7 10*3/uL (ref 1.0–3.6)
LYMPHS PCT: 25 %
MCH: 35.4 pg — AB (ref 26.0–34.0)
MCHC: 34.6 g/dL (ref 32.0–36.0)
MCV: 102.3 fL — AB (ref 80.0–100.0)
Monocytes Absolute: 0.6 10*3/uL (ref 0.2–1.0)
Monocytes Relative: 10 %
Neutro Abs: 3.9 10*3/uL (ref 1.4–6.5)
Neutrophils Relative %: 60 %
PLATELETS: 189 10*3/uL (ref 150–440)
RBC: 3.63 MIL/uL — AB (ref 4.40–5.90)
RDW: 13 % (ref 11.5–14.5)
WBC: 6.5 10*3/uL (ref 3.8–10.6)

## 2016-07-20 LAB — HEPATIC FUNCTION PANEL
ALT: 25 U/L (ref 17–63)
AST: 21 U/L (ref 15–41)
Albumin: 3.6 g/dL (ref 3.5–5.0)
Alkaline Phosphatase: 68 U/L (ref 38–126)
BILIRUBIN TOTAL: 0.5 mg/dL (ref 0.3–1.2)
Total Protein: 6.9 g/dL (ref 6.5–8.1)

## 2016-07-20 LAB — BASIC METABOLIC PANEL
ANION GAP: 9 (ref 5–15)
BUN: 20 mg/dL (ref 6–20)
CHLORIDE: 102 mmol/L (ref 101–111)
CO2: 26 mmol/L (ref 22–32)
Calcium: 9.1 mg/dL (ref 8.9–10.3)
Creatinine, Ser: 1.07 mg/dL (ref 0.61–1.24)
GFR calc Af Amer: >60 mL/min (ref >60)
GLUCOSE: 223 mg/dL — AB (ref 65–99)
POTASSIUM: 4.2 mmol/L (ref 3.5–5.1)
Sodium: 137 mmol/L (ref 135–145)

## 2016-07-20 LAB — LIPASE, BLOOD: Lipase: 26 U/L (ref 11–51)

## 2016-07-20 LAB — TROPONIN I: Troponin I: <0.03 ng/mL (ref <0.03)

## 2016-07-20 LAB — LACTIC ACID, PLASMA: Lactic Acid, Venous: 1.7 mmol/L (ref 0.5–1.9)

## 2016-07-20 MED ORDER — ASPIRIN 81 MG PO CHEW
162.0000 mg | CHEWABLE_TABLET | Freq: Once | ORAL | Status: AC
  Administered 2016-07-20: 162 mg via ORAL
  Filled 2016-07-20: qty 2

## 2016-07-20 MED ORDER — ONDANSETRON HCL 4 MG/2ML IJ SOLN
4.0000 mg | Freq: Once | INTRAMUSCULAR | Status: AC
  Administered 2016-07-20: 4 mg via INTRAVENOUS
  Filled 2016-07-20: qty 2

## 2016-07-20 MED ORDER — IOPAMIDOL (ISOVUE-370) INJECTION 76%
100.0000 mL | Freq: Once | INTRAVENOUS | Status: AC | PRN
Start: 2016-07-20 — End: 2016-07-20
  Administered 2016-07-20: 100 mL via INTRAVENOUS

## 2016-07-20 MED ORDER — FENTANYL CITRATE (PF) 100 MCG/2ML IJ SOLN: 50.0000 ug | Freq: Once | INTRAMUSCULAR | Status: DC

## 2016-07-20 NOTE — ED Provider Notes (Signed)
[facility] Emergency Department Provider Note  ____________________________________________   First MD Initiated Contact with Patient 07/20/16 1855     (approximate)  I have reviewed the triage vital signs and the nursing notes.   HISTORY  Chief Complaint Nausea   HPI Chase Simmons is a 83 y.o. male who comes to the emergency department via EMS with nausea vomiting and diaphoresis. He states this is what it feels like when he had his heart attack roughly 2 weeks ago. He said he did not receive a stent and he had a short stay in the hospital. He denies chest pain or shortness of breath. His pain in his abdomen is severe nonradiating. Nothing seems to make it better or worse. The pain is constant.   Past Medical History:  Diagnosis Date  . Allergy   . Atrial fibrillation ([place]) 08/30/2013  . Cancer [facility]) pt unsure of year   bladder cancer-Dr. Jacqlyn Larsen  . COPD (chronic obstructive pulmonary disease) ([place])   . Coronary atherosclerosis of native coronary artery 01/05/2016  . Hyperlipidemia   . Hypertension   . Tremor, essential    sees [facility] neurologist  . Type 2 diabetes mellitus with complication, without long-term current use of insulin ([place]) 08/01/2012  . Urinary incontinence     Patient Active Problem List   Diagnosis Date Noted  . Atypical chest pain 07/20/2016  . Elevated troponin 07/10/2016  . Nausea & vomiting 07/10/2016  . Diarrhea 07/10/2016  . Persistent atrial fibrillation ([place]) 07/10/2016  . NSTEMI (non-ST elevated myocardial infarction) ([place]) 07/10/2016  . Fall 07/07/2016  . COPD (chronic obstructive pulmonary disease) ([place]) 03/15/2016  . Coronary atherosclerosis of native coronary artery 01/05/2016  . Aortic atherosclerosis ([place]) 01/05/2016  . Bradycardia 01/05/2016  . Anxiety 06/17/2015  . Malignant neoplasm of urinary bladder ([place]) 06/17/2015  . Allergic rhinitis 05/14/2015  . Knee pain, bilateral 03/16/2015  . PAF (paroxysmal  atrial fibrillation) ([place]) 08/30/2013  . Acid reflux 08/30/2013  . Tremor, essential 10/22/2012  . Hyperlipidemia 10/22/2012  . HTN (hypertension) 08/01/2012  . Type 2 diabetes mellitus with complication, without long-term current use of insulin ([place]) 08/01/2012  . Benign prostatic hyperplasia with urinary obstruction 09/02/2011    Past Surgical History:  Procedure Laterality Date  . bladder cancer    . CATARACT EXTRACTION    . HERNIA REPAIR    . LEFT HEART CATH AND CORONARY ANGIOGRAPHY N/A 07/11/2016   Procedure: Left Heart Cath and Coronary Angiography;  Surgeon: Wellington Hampshire, MD;  Location: [facility];  Service: Cardiovascular;  Laterality: N/A;    Prior to Admission medications   Medication Sig Start Date End Date Taking? Authorizing Provider  atorvastatin (LIPITOR) 80 MG tablet TAKE 1 TABLET ONE TIME DAILY 11/04/15  Yes Cook, Jayce G, DO  carvedilol (COREG) 3.125 MG tablet Take 1 tablet (3.125 mg total) by mouth 2 (two) times daily with a meal. 07/13/16  Yes Vaughan Basta, MD  glimepiride (AMARYL) 4 MG tablet take 1 tablet by mouth WITH BREAKFAST. 07/05/16  Yes Cook, Jayce G, DO  lisinopril (PRINIVIL,ZESTRIL) 10 MG tablet Take 1 tablet (10 mg total) by mouth daily. 07/14/16  Yes Vaughan Basta, MD  PARoxetine (PAXIL) 40 MG tablet take 1 tablet by mouth once daily 05/23/16  Yes Cook, Jayce G, DO  primidone (MYSOLINE) 50 MG tablet Take 1.5 tablets (75 mg total) by mouth 2 (two) times daily. 01/29/16  Yes Cook, Jayce G, DO  acetaminophen (TYLENOL) 325 MG tablet Take  2 tablets (650 mg total) by mouth every 6 (six) hours as needed for mild pain (or Fever >/= 101). 06/22/15   Nicholes Mango, MD  ALPRAZolam Duanne Moron) 0.25 MG tablet take 1 tablet by mouth every 12 hours if needed for anxiety 07/05/16   Coral Spikes, DO  aspirin EC 81 MG tablet Take 1 tablet (81 mg total) by mouth daily. Patient not taking: Reported on 07/12/2016 03/15/16   Coral Spikes, DO    ipratropium-albuterol (DUONEB) 0.5-2.5 (3) MG/3ML SOLN Take 3 mLs by nebulization every 6 (six) hours as needed. 06/08/15   Dustin Flock, MD  tiotropium (SPIRIVA) 18 MCG inhalation capsule Place 1 capsule (18 mcg total) into inhaler and inhale daily. Patient not taking: Reported on 07/20/2016 01/08/15   Rubbie Battiest, NP    Allergies Latex  Family History  Problem Relation Age of Onset  . Arthritis Mother   . Dementia Mother   . Cancer Maternal Uncle        prostate  . Cancer Maternal Aunt        breast cancer    Social History Social History  Substance Use Topics  . Smoking status: Former Smoker    Packs/day: 1.50    Years: 40.00  . Smokeless tobacco: Never Used  . Alcohol use 7.2 oz/week    12 Standard drinks or equivalent per week     Comment: 1 drink with dinner    Review of Systems Constitutional: No fever/chills Eyes: No visual changes. ENT: No sore throat. Cardiovascular: Denies chest pain. Respiratory: Denies shortness of breath. Gastrointestinal: Positive abdominal pain.  Positive nausea, Positive vomiting.  No diarrhea.  No constipation. Genitourinary: Negative for dysuria. Musculoskeletal: Negative for back pain. Skin: Negative for rash. Neurological: Negative for headaches, focal weakness or numbness.   ____________________________________________   PHYSICAL EXAM:  VITAL SIGNS: ED Triage Vitals  Enc Vitals Group     BP 07/20/16 1900 (!) 169/70     Pulse Rate 07/20/16 1900 (!) 56     Resp 07/20/16 1900 20     Temp 07/20/16 1900 97.8 F (36.6 C)     Temp Source 07/20/16 1900 Oral     SpO2 07/20/16 1900 100 %     Weight 07/20/16 1901 238 lb (108 kg)     Height 07/20/16 1901 5\' 11"  (1.803 m)     Head Circumference --      [facility] Flow --      Pain Score --      Pain Loc --      Pain Edu? --      Excl. in [place]? --     Constitutional: Alert and oriented 4 diaphoretic and ill appearing Eyes: PERRL EOMI. Head: Atraumatic. Nose: No  congestion/rhinnorhea. Mouth/Throat: No trismus Neck: No stridor.   Cardiovascular: Normal rate, regular rhythm. Grossly normal heart sounds.  Good peripheral circulation. Respiratory: Normal respiratory effort.  No retractions. Lungs CTAB and moving good air Gastrointestinal: Diffuse mild tenderness without focality no frank peritonitis Musculoskeletal: No lower extremity edema   Neurologic:  Normal speech and language. No gross focal neurologic deficits are appreciated. Skin:  Skin is warm, dry and intact. No rash noted. Psychiatric: Mood and affect are normal. Speech and behavior are normal.    ____________________________________________   DIFFERENTIAL includes but not limited to  Acute coronary syndrome, aortic dissection, pulmonary embolism, volvulus, small bowel obstruction, perforation, mesenteric ischemia ____________________________________________   LABS (all labs ordered are listed, but only abnormal results are displayed)  Labs Reviewed  BASIC METABOLIC PANEL - Abnormal; Notable for the following:       Result Value   Glucose, Bld 223 (*)    All other components within normal limits  HEPATIC FUNCTION PANEL - Abnormal; Notable for the following:    Bilirubin, Direct <0.1 (*)    All other components within normal limits  CBC WITH DIFFERENTIAL/PLATELET - Abnormal; Notable for the following:    RBC 3.63 (*)    Hemoglobin 12.9 (*)    HCT 37.2 (*)    MCV 102.3 (*)    MCH 35.4 (*)    All other components within normal limits  TROPONIN I - Abnormal; Notable for the following:    Troponin I 0.04 (*)    All other components within normal limits  TROPONIN I - Abnormal; Notable for the following:    Troponin I 0.04 (*)    All other components within normal limits  GLUCOSE, CAPILLARY - Abnormal; Notable for the following:    Glucose-Capillary 231 (*)    All other components within normal limits  TROPONIN I - Abnormal; Notable for the following:    Troponin I 0.04 (*)      All other components within normal limits  GLUCOSE, CAPILLARY - Abnormal; Notable for the following:    Glucose-Capillary 107 (*)    All other components within normal limits  GLUCOSE, CAPILLARY - Abnormal; Notable for the following:    Glucose-Capillary 148 (*)    All other components within normal limits  LIPASE, BLOOD  TROPONIN I  LACTIC ACID, PLASMA  LACTIC ACID, PLASMA    First troponin negative  EKG  ED ECG REPORT I, Darel Hong, the attending physician, personally viewed and interpreted this ECG.  Date: 07/20/2016 EKG Time: 1859 Rate: 55 Rhythm: Atrial fibrillation QRS Axis: normal Intervals: normal ST/T Wave abnormalities: Biphasic T-wave in V2 T-wave inversion in V3 V4 V5 V6 Narrative Interpretation: Nonspecific intraventricular conduction delay along with new T-wave inversions abnormal  ____________________________________________  RADIOLOGY  CT angiogram with no acute disease ____________________________________________   PROCEDURES  Procedure(s) performed: no  Procedures  Critical Care performed: no  Observation: no ____________________________________________   INITIAL IMPRESSION / ASSESSMENT AND PLAN / ED COURSE  Pertinent labs & imaging results that were available during my care of the patient were reviewed by me and considered in my medical decision making (see chart for details).  The patient arrives uncomfortable appearing retching and vomiting and slightly diaphoretic. Differential is broad but with his reported history of recent myocardial infarction and worried about an acute coronary event. Labs are pending.    ----------------------------------------- 8:00 PM on 07/20/2016 -----------------------------------------  While the patient had a fairly reassuring cardiac catheterization 9 days ago his EKG today shows new changes. I discussed the case with on-call cardiologist Dr. Lovena Le who recommends against heparinization and feels  that these ST changes are likely secondary to demand. Fortunately his CT scan is negative for acute pathology as well, however given these changes I believe he warrants inpatient admission for full cardiac rule out.  ____________________________________________   FINAL CLINICAL IMPRESSION(S) / ED DIAGNOSES  Final diagnoses:  Pain      NEW MEDICATIONS STARTED DURING THIS VISIT:  Current Discharge Medication List       Note:  This document was prepared using Dragon voice recognition software and may include unintentional dictation errors.     Darel Hong, MD 07/21/16 1655

## 2016-07-20 NOTE — ED Notes (Signed)
Patient transported to CT 

## 2016-07-20 NOTE — ED Notes (Signed)
Patient assisted up in bed by this nurse and Di Kindle, EDT.

## 2016-07-20 NOTE — Telephone Encounter (Signed)
Letter mailed to patient with appointment information and instructions to call if any questions regarding discharge instructions or medications.

## 2016-07-20 NOTE — Telephone Encounter (Signed)
Left voicemail message with appointment date and time with instructions to call back if any further questions. Will mail letter to patient.

## 2016-07-20 NOTE — ED Triage Notes (Signed)
Pt comes into the ED via EMS from [facility] c/o sudden onset nausea that feels similar to when he had a heart attack 9 days ago.  Patient denies any chest pain, shortness of breath, or dizziness.  Patient alert and oriented with all VS stable at this time.  No medications given in route.

## 2016-07-21 DIAGNOSIS — R112 Nausea with vomiting, unspecified: Secondary | ICD-10-CM | POA: Diagnosis not present

## 2016-07-21 DIAGNOSIS — I10 Essential (primary) hypertension: Secondary | ICD-10-CM | POA: Diagnosis not present

## 2016-07-21 DIAGNOSIS — J449 Chronic obstructive pulmonary disease, unspecified: Secondary | ICD-10-CM | POA: Diagnosis not present

## 2016-07-21 DIAGNOSIS — I2510 Atherosclerotic heart disease of native coronary artery without angina pectoris: Secondary | ICD-10-CM | POA: Diagnosis not present

## 2016-07-21 LAB — GLUCOSE, CAPILLARY
GLUCOSE-CAPILLARY: 121 mg/dL — AB (ref 65–99)
GLUCOSE-CAPILLARY: 148 mg/dL — AB (ref 65–99)
GLUCOSE-CAPILLARY: 231 mg/dL — AB (ref 65–99)
Glucose-Capillary: 107 mg/dL — ABNORMAL HIGH (ref 65–99)
Glucose-Capillary: 194 mg/dL — ABNORMAL HIGH (ref 65–99)

## 2016-07-21 LAB — TROPONIN I
TROPONIN I: 0.04 ng/mL — AB (ref <0.03)
TROPONIN I: 0.04 ng/mL — AB (ref <0.03)
Troponin I: 0.04 ng/mL (ref <0.03)

## 2016-07-21 LAB — LACTIC ACID, PLASMA: Lactic Acid, Venous: 1.6 mmol/L (ref 0.5–1.9)

## 2016-07-21 MED ORDER — ONDANSETRON HCL 4 MG/2ML IJ SOLN
4.0000 mg | Freq: Four times a day (QID) | INTRAMUSCULAR | Status: AC
  Administered 2016-07-21 – 2016-07-22 (×6): 4 mg via INTRAVENOUS
  Filled 2016-07-21 (×6): qty 2

## 2016-07-21 MED ORDER — SODIUM CHLORIDE 0.9 % IV SOLN
INTRAVENOUS | Status: DC
  Administered 2016-07-21 – 2016-07-23 (×4): via INTRAVENOUS

## 2016-07-21 MED ORDER — MORPHINE SULFATE (PF) 2 MG/ML IV SOLN: 2.0000 mg | INTRAVENOUS | Status: DC | PRN

## 2016-07-21 MED ORDER — MORPHINE SULFATE (PF) 2 MG/ML IV SOLN: 1.0000 mg | INTRAVENOUS | Status: DC | PRN

## 2016-07-21 MED ORDER — NITROGLYCERIN 0.4 MG SL SUBL: 0.4000 mg | SUBLINGUAL_TABLET | SUBLINGUAL | Status: DC | PRN

## 2016-07-21 MED ORDER — PANTOPRAZOLE SODIUM 40 MG PO TBEC
40.0000 mg | DELAYED_RELEASE_TABLET | Freq: Every day | ORAL | Status: DC
  Administered 2016-07-21 – 2016-07-25 (×5): 40 mg via ORAL
  Filled 2016-07-21 (×5): qty 1

## 2016-07-21 MED ORDER — METOCLOPRAMIDE HCL 5 MG/ML IJ SOLN: 10.0000 mg | Freq: Four times a day (QID) | INTRAMUSCULAR | Status: DC | PRN

## 2016-07-21 MED ORDER — IPRATROPIUM-ALBUTEROL 0.5-2.5 (3) MG/3ML IN SOLN: 3.0000 mL | Freq: Four times a day (QID) | RESPIRATORY_TRACT | Status: DC | PRN

## 2016-07-21 MED ORDER — ZOLPIDEM TARTRATE 5 MG PO TABS: 5.0000 mg | ORAL_TABLET | Freq: Every evening | ORAL | Status: DC | PRN

## 2016-07-21 MED ORDER — ACETAMINOPHEN 325 MG PO TABS: 650.0000 mg | ORAL_TABLET | Freq: Four times a day (QID) | ORAL | Status: DC | PRN

## 2016-07-21 MED ORDER — ATORVASTATIN CALCIUM 20 MG PO TABS
80.0000 mg | ORAL_TABLET | Freq: Every day | ORAL | Status: DC
  Administered 2016-07-21 – 2016-07-24 (×4): 80 mg via ORAL
  Filled 2016-07-21 (×4): qty 4

## 2016-07-21 MED ORDER — LISINOPRIL 10 MG PO TABS
10.0000 mg | ORAL_TABLET | Freq: Every day | ORAL | Status: DC
  Administered 2016-07-21 – 2016-07-24 (×4): 10 mg via ORAL
  Filled 2016-07-21 (×4): qty 1

## 2016-07-21 MED ORDER — PAROXETINE HCL 20 MG PO TABS
40.0000 mg | ORAL_TABLET | Freq: Every day | ORAL | Status: DC
  Administered 2016-07-21 – 2016-07-25 (×5): 40 mg via ORAL
  Filled 2016-07-21 (×5): qty 2

## 2016-07-21 MED ORDER — INSULIN ASPART 100 UNIT/ML ~~LOC~~ SOLN
0.0000 [IU] | Freq: Every day | SUBCUTANEOUS | Status: DC
  Administered 2016-07-21: 2 [IU] via SUBCUTANEOUS
  Filled 2016-07-21: qty 1

## 2016-07-21 MED ORDER — GI COCKTAIL ~~LOC~~
30.0000 mL | Freq: Four times a day (QID) | ORAL | Status: DC | PRN
  Filled 2016-07-21: qty 30

## 2016-07-21 MED ORDER — HEPARIN SODIUM (PORCINE) 5000 UNIT/ML IJ SOLN
5000.0000 [IU] | Freq: Three times a day (TID) | INTRAMUSCULAR | Status: DC
  Administered 2016-07-21 – 2016-07-25 (×14): 5000 [IU] via SUBCUTANEOUS
  Filled 2016-07-21 (×14): qty 1

## 2016-07-21 MED ORDER — ONDANSETRON HCL 4 MG/2ML IJ SOLN: 4.0000 mg | Freq: Four times a day (QID) | INTRAMUSCULAR | Status: DC | PRN

## 2016-07-21 MED ORDER — INSULIN ASPART 100 UNIT/ML ~~LOC~~ SOLN
0.0000 [IU] | Freq: Three times a day (TID) | SUBCUTANEOUS | Status: DC
  Administered 2016-07-21: 3 [IU] via SUBCUTANEOUS
  Administered 2016-07-21: 4 [IU] via SUBCUTANEOUS
  Administered 2016-07-22 (×2): 3 [IU] via SUBCUTANEOUS
  Administered 2016-07-23: 4 [IU] via SUBCUTANEOUS
  Administered 2016-07-23: 3 [IU] via SUBCUTANEOUS
  Administered 2016-07-23: 4 [IU] via SUBCUTANEOUS
  Administered 2016-07-24: 15 [IU] via SUBCUTANEOUS
  Administered 2016-07-24 – 2016-07-25 (×2): 3 [IU] via SUBCUTANEOUS
  Administered 2016-07-25: 11 [IU] via SUBCUTANEOUS
  Filled 2016-07-21 (×11): qty 1

## 2016-07-21 MED ORDER — PRIMIDONE 50 MG PO TABS
75.0000 mg | ORAL_TABLET | Freq: Two times a day (BID) | ORAL | Status: DC
  Administered 2016-07-21 – 2016-07-25 (×10): 75 mg via ORAL
  Filled 2016-07-21 (×13): qty 1.5

## 2016-07-21 MED ORDER — ASPIRIN EC 81 MG PO TBEC
81.0000 mg | DELAYED_RELEASE_TABLET | Freq: Every day | ORAL | Status: DC
Start: 2016-07-21 — End: 2016-07-25
  Administered 2016-07-21 – 2016-07-25 (×5): 81 mg via ORAL
  Filled 2016-07-21 (×5): qty 1

## 2016-07-21 MED ORDER — CARVEDILOL 3.125 MG PO TABS: 3.1250 mg | ORAL_TABLET | Freq: Two times a day (BID) | ORAL | Status: DC

## 2016-07-21 MED ORDER — ALPRAZOLAM 0.25 MG PO TABS
0.2500 mg | ORAL_TABLET | Freq: Two times a day (BID) | ORAL | Status: DC | PRN
  Administered 2016-07-21 – 2016-07-25 (×4): 0.25 mg via ORAL
  Filled 2016-07-21 (×4): qty 1

## 2016-07-21 NOTE — Clinical Social Work Note (Signed)
CSW met with patient and he states he is not sure if he wants to return back to [facility] to continue with his therapy.  CSW spoke to patient's daughter Derrik Mceachern who is requesting patient to go to a different SNF and would prefer [facility] if possible.  Patient's daughter stated was informed that CSW can work on a different SNF placement.  CSW explained to patient's daughter that patient's 20 days will not start over, it will just continue at how many days he has left when he was discharged from [facility].  Patient's daughter expressed understanding and gave CSW permission to begin bed search for a different facility.  Formal assessment to follow, CSW to continue to follow patient's progress throughout discharge planning.  Jones Broom. Norval Morton, MSW, [place]  07/21/2016 5:36 PM

## 2016-07-21 NOTE — Progress Notes (Signed)
[place] [facility] [place] NAME: Chase Simmons    MR#:  [id number]  DATE OF BIRTH:  [birth year]  SUBJECTIVE:  CHIEF COMPLAINT:   Chief Complaint  Patient presents with  . Nausea   -Recent admission for nausea and elevated troponin, cardiac catheterization with nonobstructive coronary disease. -Comes back again with nausea, started while eating a hotdog. Had gallbladder issues in the past.  REVIEW OF SYSTEMS:  Review of Systems  Constitutional: Negative for chills, fever and malaise/fatigue.  HENT: Positive for hearing loss. Negative for congestion, ear discharge and nosebleeds.   Eyes: Negative for blurred vision and double vision.  Respiratory: Negative for cough, shortness of breath and wheezing.   Cardiovascular: Negative for chest pain, palpitations and leg swelling.  Gastrointestinal: Positive for abdominal pain and nausea. Negative for constipation, diarrhea and vomiting.  Genitourinary: Negative for dysuria.  Musculoskeletal: Negative for myalgias.  Neurological: Negative for dizziness, sensory change, speech change, focal weakness, seizures and headaches.  Psychiatric/Behavioral: Negative for depression.    DRUG ALLERGIES:   Allergies  Allergen Reactions  . Latex Itching    VITALS:  Blood pressure (!) 164/58, pulse (!) 50, temperature 97.7 F (36.5 C), temperature source Oral, resp. rate 18, height 5\' 11"  (1.803 m), weight 108 kg (238 lb), SpO2 100 %.  PHYSICAL EXAMINATION:  Physical Exam  GENERAL:  82 y.o.-year-old patient lying in the bed with no acute distress.  EYES: Pupils equal, round, reactive to light and accommodation. No scleral icterus. Extraocular muscles intact.  HEENT: Head atraumatic, normocephalic. Oropharynx and nasopharynx clear.  NECK:  Supple, no jugular venous distention. No thyroid enlargement, no tenderness.  LUNGS: Normal breath sounds bilaterally, no wheezing, rales,rhonchi or crepitation. No use of  accessory muscles of respiration. Decreased bibasilar breath sounds noted CARDIOVASCULAR: S1, S2 normal. No rubs, or gallops. 2/6 systolic murmur present ABDOMEN: Soft, nontender, nondistended. Bowel sounds present. No organomegaly or mass.  EXTREMITIES: No pedal edema, cyanosis, or clubbing.  NEUROLOGIC: Cranial nerves II through XII are intact. Muscle strength 5/5 in all extremities. Sensation intact. Gait not checked. Global weakness noted PSYCHIATRIC: The patient is alert and oriented x 2-3. Sometimes repeating the same sentence SKIN: No obvious rash, lesion, or ulcer.    LABORATORY PANEL:   CBC  Recent Labs Lab 07/20/16 1859  WBC 6.5  HGB 12.9*  HCT 37.2*  PLT 189   ------------------------------------------------------------------------------------------------------------------  Chemistries   Recent Labs Lab 07/20/16 1859  NA 137  K 4.2  CL 102  CO2 26  GLUCOSE 223*  BUN 20  CREATININE 1.07  CALCIUM 9.1  AST 21  ALT 25  ALKPHOS 68  BILITOT 0.5   ------------------------------------------------------------------------------------------------------------------  Cardiac Enzymes  Recent Labs Lab 07/21/16 0658  TROPONINI 0.04*   ------------------------------------------------------------------------------------------------------------------  RADIOLOGY:  Dg Chest Port 1 View  Result Date: 07/20/2016 CLINICAL DATA:  Shortness of Breath EXAM: PORTABLE CHEST 1 VIEW COMPARISON:  Chest radiograph June 20, 2015 and chest CT June 21, 2015 FINDINGS: There is no edema or consolidation. Heart is upper normal in size with pulmonary vascularity within normal limits. There is aortic atherosclerosis. No adenopathy. There is degenerative change in the right shoulder. IMPRESSION: No edema or consolidation.  There is aortic atherosclerosis. Aortic Atherosclerosis (ICD10-I70.0). Electronically Signed   By: Lowella Grip III M.D.   On: 07/20/2016 19:30   Ct Angio  Chest/abd/pel For Dissection W And/or W/wo  Result Date: 07/20/2016 CLINICAL DATA:  Vomiting and diaphoresis EXAM: CT ANGIOGRAPHY CHEST, ABDOMEN  AND PELVIS TECHNIQUE: Multidetector CT imaging through the chest, abdomen and pelvis was performed using the standard protocol during bolus administration of intravenous contrast. Multiplanar reconstructed images and MIPs were obtained and reviewed to evaluate the vascular anatomy. CONTRAST:  100 cc Isovue 370 IV COMPARISON:  07/10/2016 CT abdomen and pelvis, chest CT 06/21/2015 FINDINGS: CTA CHEST FINDINGS Cardiovascular: Satisfactory opacification of the thoracic aorta and pulmonary arteries. No evidence of acute pulmonary embolus. No aortic aneurysm or dissection. Aortic atherosclerosis and coronary arteriosclerosis is identified. Heart is borderline enlarged without pericardial effusion. Great vessels are patent with normal branch pattern. Mediastinum/Nodes: No enlarged mediastinal, hilar, or axillary lymph nodes. Thyroid gland, trachea, and esophagus demonstrate no significant findings. Lungs/Pleura: Stable scattered peripheral interstitial change of the lungs. Subpleural right lower lobe 6 mm nodule is unchanged with probable atelectasis along the right major fissure having accounted for and 11 mm density on prior study. This is less prominent on current exam. No significant effusion. No pneumothorax nor overt pulmonary edema. No pulmonary consolidations. Musculoskeletal: No acute nor suspicious osseous lesions. Chronic healed fracture deformity of the upper sternum likely related to old remote trauma. Stable thoracolumbar spondylosis. Review of the MIP images confirms the above findings. CTA ABDOMEN AND PELVIS FINDINGS VASCULAR Aorta: Aortic atherosclerosis without aneurysm or dissection. No significant stenosis. Celiac: Patent without evidence of aneurysm, dissection, vasculitis or significant stenosis. SMA: Patent without evidence of aneurysm, dissection,  vasculitis or significant stenosis. Renals: Both renal arteries are patent without evidence of aneurysm, dissection, vasculitis, fibromuscular dysplasia or significant stenosis. IMA: Patent without evidence of aneurysm, dissection, vasculitis or significant stenosis. Inflow: Atherosclerosis of the common iliac arteries and branch vessels without significant stenosis or aneurysm. Veins: No obvious venous abnormality within the limitations of this arterial phase study. Review of the MIP images confirms the above findings. NON-VASCULAR Hepatobiliary: Layering gallstones within a physiologically distended gallbladder. No wall thickening or pericholecystic fluid. No enhancing hepatic mass or biliary dilatation. No choledocholithiasis. Pancreas: Atrophic without ductal dilatation or mass. Spleen: Small splenule at the splenic hilum. No splenomegaly. No focal mass. Adrenals/Urinary Tract: Normal bilateral adrenal glands and kidneys without obstructive uropathy. Simple 16 mm left upper pole renal cyst. Smaller hypodensities are noted within both kidneys too small to further characterize the largest in the upper pole of the right kidney estimated at 12 mm. No hydroureteronephrosis. Physiologic distention of the bladder. Stomach/Bowel: The stomach is moderately distended with food. There is normal small bowel rotation without obstruction or inflammation. Scattered colonic diverticulosis along the transverse, descending and sigmoid colon with a moderate amount of fecal retention within the left colon to the level of the rectum. Lymphatic: No pelvic, inguinal, retroperitoneal, abdominal or mesenteric lymphadenopathy by CT size criteria. Reproductive: Prostate is unremarkable. Other: No abdominal wall hernia or abnormality. Status post ventral mesh repair of hernia. No abdominopelvic ascites. Musculoskeletal: No lytic or blastic disease. Lumbar spondylosis with multilevel lumbar facet arthropathy. Review of the MIP images  confirms the above findings. IMPRESSION: 1. No thoracic aortic aneurysm or dissection. 2. No acute pulmonary embolus. 3. Stable subpleural 6 mm nodule in the right lower lobe though it has only been 1 month since the comparison study. Non-contrast chest CT at 6-12 months is therefore now recommended. If the nodule is stable at time of repeat CT, then future CT at 18-24 months (from today's scan) is considered optional for low-risk patients, but is recommended for high-risk patients. This recommendation follows the consensus statement: Guidelines for Management of Incidental Pulmonary Nodules Detected on  CT Images: From the Fleischner Society 2017; Radiology 2017; [phone]. 4. Coronary arteriosclerosis. 5. Aortoiliac and branch vessel atherosclerosis without significant stenosis or dissection. No aneurysm. 6. Uncomplicated cholelithiasis. 7. Bilateral renal cysts. 8. Colonic diverticulosis without acute diverticulitis. No acute bowel inflammation. 9. Prior ventral mesh repair of hernia. Electronically Signed   By: Ashley Royalty M.D.   On: 07/20/2016 21:03    EKG:   Orders placed or performed during the hospital encounter of 07/20/16  . ED EKG  . ED EKG  . EKG 12-Lead  . EKG 12-Lead  . Repeat EKG  . Repeat EKG  . EKG 12-Lead (at 6am)  . EKG 12-Lead (Repeat cardiac markers, recurrent chest pain)  . EKG 12-Lead (at 6am)  . EKG 12-Lead (Repeat cardiac markers, recurrent chest pain)    ASSESSMENT AND PLAN:   83 year old male with past medical history significant for atrial fibrillation, COPD, hypertension, diabetes, systolic congestive heart failure with EF of 25% presents to hospital secondary to sudden onset of nausea.  #1 nausea-intermittent nausea episodes, last time thought to be secondary to cardiac causes. However catheterization revealed nonobstructive coronary disease. -Sounds more like a gallbladder versus gastritis issue as it has started while eating a fatty meal. -Right upper quadrant  ultrasound ordered. Started on Zofran scheduled for a day with when necessary Reglan. -Started on clear liquids and advance as tolerated. -Started on Protonix. If needed, can do a GI consult.  #2 diabetes mellitus-continue sliding scale insulin.  #3 hypertension-on lisinopril  More for essential tremors-and continue primidone  #5 DVT prophylaxis-subcutaneous heparin    physical therapy consulted. Patient is currently at [facility]. -son has been updated at bedside    All the records are reviewed and case discussed with Care Management/Social Workerr. Management plans discussed with the patient, family and they are in agreement.  CODE STATUS: Full code  TOTAL TIME TAKING CARE OF THIS PATIENT: 38 minutes.   POSSIBLE D/C IN 1-2 DAYS, DEPENDING ON CLINICAL CONDITION.   Gladstone Lighter M.D on 07/21/2016 at 1:58 PM  Between 7am to 6pm - Pager - [phone]  After 6pm go to [email or web address] - password [email or web address] [facility]  Office  [phone]  CC: Primary care physician; Coral Spikes, DO

## 2016-07-21 NOTE — H&P (Signed)
[place] @ [facility] Admission History and Physical [employer], D.O.  ---------------------------------------------------------------------------------------------------------------------   PATIENT NAME: Chase Simmons MR#: [id number] DATE OF BIRTH: Oct 07, 1933 DATE OF ADMISSION: 07/20/2016 PRIMARY CARE PHYSICIAN: Coral Spikes, DO  REQUESTING/REFERRING PHYSICIAN: ED Dr. Mable Paris  CHIEF COMPLAINT: Chief Complaint  Patient presents with  . Nausea    HISTORY OF PRESENT ILLNESS: Chase Simmons is a 83 y.o. male with a known history of Atrial fibrillation, COPD, coronary artery disease, hypertension, hyperlipidemia, diabetes, systolic congestive heart presents to the emergency department for evaluation of nausea.  Patient was in a usual state of health until this afternoon when he was brought to the emergency department by ambulance from [facility] for sudden onset of nausea. Patient describes vague GI upset which was similar to symptoms he had 2 weeks ago when he was admitted for elevated troponins, presumed NSTEMI. Catheterization at that time showed mild nonobstructive coronary artery disease. Echo showed severely reduced LV systolic dysfunction with EF 25-30% and wall motion abnormalities suggestive of stress-induced versus ischemic cardiomyopathy.. He was discharged on Lipitor, Coreg and lisinopril which he states he has been compliant.   Patient denies fevers/chills, weakness, dizziness,  chest pain, shortness of breath, vomiting, constipation or diarrhea  abdominal pain, dysuria/frequency, changes in mental status.   EMS/ED COURSE:   Patient receiveAspirin 162 mg, fentanyl 50 g, Zofran 4 Miller grams 2. Medical admission was requested for ongoing management of atypical chest pain, EKG changes in light of recent findings.  PAST MEDICAL HISTORY: Past Medical History:  Diagnosis Date  . Allergy   . Atrial fibrillation ([place]) 08/30/2013  . Cancer [facility]) pt unsure  of year   bladder cancer-Dr. Jacqlyn Larsen  . COPD (chronic obstructive pulmonary disease) ([place])   . Coronary atherosclerosis of native coronary artery 01/05/2016  . Hyperlipidemia   . Hypertension   . Tremor, essential    sees [facility] neurologist  . Type 2 diabetes mellitus with complication, without long-term current use of insulin ([place]) 08/01/2012  . Urinary incontinence       PAST SURGICAL HISTORY: Past Surgical History:  Procedure Laterality Date  . bladder cancer    . CATARACT EXTRACTION    . HERNIA REPAIR    . LEFT HEART CATH AND CORONARY ANGIOGRAPHY N/A 07/11/2016   Procedure: Left Heart Cath and Coronary Angiography;  Surgeon: Wellington Hampshire, MD;  Location: [place] [facility];  Service: Cardiovascular;  Laterality: N/A;      SOCIAL HISTORY: Social History  Substance Use Topics  . Smoking status: Former Smoker    Packs/day: 1.50    Years: 40.00  . Smokeless tobacco: Never Used  . Alcohol use 7.2 oz/week    12 Standard drinks or equivalent per week     Comment: 1 drink with dinner      FAMILY HISTORY: Family History  Problem Relation Age of Onset  . Arthritis Mother   . Dementia Mother   . Cancer Maternal Uncle        prostate  . Cancer Maternal Aunt        breast cancer     MEDICATIONS AT HOME: Prior to Admission medications   Medication Sig Start Date End Date Taking? Authorizing Provider  atorvastatin (LIPITOR) 80 MG tablet TAKE 1 TABLET ONE TIME DAILY 11/04/15  Yes Cook, Jayce G, DO  carvedilol (COREG) 3.125 MG tablet Take 1 tablet (3.125 mg total) by mouth 2 (two) times daily with a meal. 07/13/16  Yes Vaughan Basta, MD  glimepiride (  AMARYL) 4 MG tablet take 1 tablet by mouth WITH BREAKFAST. 07/05/16  Yes Cook, Jayce G, DO  lisinopril (PRINIVIL,ZESTRIL) 10 MG tablet Take 1 tablet (10 mg total) by mouth daily. 07/14/16  Yes Vaughan Basta, MD  PARoxetine (PAXIL) 40 MG tablet take 1 tablet by mouth once daily 05/23/16  Yes Cook, Jayce G, DO  primidone  (MYSOLINE) 50 MG tablet Take 1.5 tablets (75 mg total) by mouth 2 (two) times daily. 01/29/16  Yes Cook, Jayce G, DO  acetaminophen (TYLENOL) 325 MG tablet Take 2 tablets (650 mg total) by mouth every 6 (six) hours as needed for mild pain (or Fever >/= 101). 06/22/15   Nicholes Mango, MD  ALPRAZolam Duanne Moron) 0.25 MG tablet take 1 tablet by mouth every 12 hours if needed for anxiety 07/05/16   Coral Spikes, DO  aspirin EC 81 MG tablet Take 1 tablet (81 mg total) by mouth daily. Patient not taking: Reported on 07/12/2016 03/15/16   Coral Spikes, DO  ipratropium-albuterol (DUONEB) 0.5-2.5 (3) MG/3ML SOLN Take 3 mLs by nebulization every 6 (six) hours as needed. 06/08/15   Dustin Flock, MD  tiotropium (SPIRIVA) 18 MCG inhalation capsule Place 1 capsule (18 mcg total) into inhaler and inhale daily. Patient not taking: Reported on 07/20/2016 01/08/15   Rubbie Battiest, NP      DRUG ALLERGIES: Allergies  Allergen Reactions  . Latex Itching     REVIEW OF SYSTEMS: CONSTITUTIONAL: No fatigue, weakness, fever, chills, weight gain/loss, headache EYES: No blurry or double vision. ENT: No tinnitus, postnasal drip, redness or soreness of the oropharynx. RESPIRATORY: No dyspnea, cough, wheeze, hemoptysis. CARDIOVASCULAR: Negative  chest pain, negative orthopnea, palpitations, syncope. GASTROINTESTINAL:  Positive nausea,  negative vomiting, constipation, diarrhea, abdominal pain. No hematemesis, melena or hematochezia. GENITOURINARY: No dysuria, frequency, hematuria. ENDOCRINE: No polyuria or nocturia. No heat or cold intolerance. HEMATOLOGY: No anemia, bruising, bleeding. INTEGUMENTARY: No rashes, ulcers, lesions. MUSCULOSKELETAL: No pain, arthritis, swelling, gout. NEUROLOGIC: No numbness, tingling, weakness or ataxia. No seizure-type activity. PSYCHIATRIC: No anxiety, depression, insomnia.  PHYSICAL EXAMINATION: VITAL SIGNS: Blood pressure (!) 148/53, pulse (!) 33, temperature 97.8 F (36.6 C),  temperature source Oral, resp. rate 20, height 5\' 11"  (1.803 m), weight 108 kg (238 lb), SpO2 97 %.  GENERAL: 83 y.o.-year-old white male patient, well-developed, well-nourished lying in the bed in no acute distress.  Pleasant and cooperative.   HEENT: Head atraumatic, normocephalic. Pupils equal, round, reactive to light and accommodation. . Mucus membranes moist. NECK: Supple, full range of motion. No JVD, no bruit heard. No cervical lymphadenopathy. CHEST: Normal breath sounds bilaterally. No wheezing, rales, rhonchi or crackles. No use of accessory muscles of respiration.  No reproducible chest wall tenderness.  CARDIOVASCULAR: Irregular. S1, S2 normal. No murmurs, rubs, or gallops appreciated. Cap refill <2 seconds. ABDOMEN: Soft, nontender, nondistended. No rebound, guarding, rigidity. Normoactive bowel sounds present in all four quadrants. No organomegaly or mass. EXTREMITIES: Full range of motion. No pedal edema, cyanosis, or clubbing. NEUROLOGIC: Cranial nerves II through XII are grossly intact with no focal sensorimotor deficit.  PSYCHIATRIC: The patient is alert and oriented x 3. Normal affect, mood, thought content. SKIN: Warm, dry, and intact without obvious rash, lesion, or ulcer.  LABORATORY PANEL:  CBC  Recent Labs Lab 07/20/16 1859  WBC 6.5  HGB 12.9*  HCT 37.2*  PLT 189   ----------------------------------------------------------------------------------------------------------------- Chemistries  Recent Labs Lab 07/20/16 1859  NA 137  K 4.2  CL 102  CO2 26  GLUCOSE 223*  BUN 20  CREATININE 1.07  CALCIUM 9.1  AST 21  ALT 25  ALKPHOS 68  BILITOT 0.5   ------------------------------------------------------------------------------------------------------------------ Cardiac Enzymes  Recent Labs Lab 07/20/16 1859  TROPONINI <0.03    ------------------------------------------------------------------------------------------------------------------  RADIOLOGY: Dg Chest Port 1 View  Result Date: 07/20/2016 CLINICAL DATA:  Shortness of Breath EXAM: PORTABLE CHEST 1 VIEW COMPARISON:  Chest radiograph June 20, 2015 and chest CT June 21, 2015 FINDINGS: There is no edema or consolidation. Heart is upper normal in size with pulmonary vascularity within normal limits. There is aortic atherosclerosis. No adenopathy. There is degenerative change in the right shoulder. IMPRESSION: No edema or consolidation.  There is aortic atherosclerosis. Aortic Atherosclerosis (ICD10-I70.0). Electronically Signed   By: Lowella Grip III M.D.   On: 07/20/2016 19:30   Ct Angio Chest/abd/pel For Dissection W And/or W/wo  Result Date: 07/20/2016 CLINICAL DATA:  Vomiting and diaphoresis EXAM: CT ANGIOGRAPHY CHEST, ABDOMEN AND PELVIS TECHNIQUE: Multidetector CT imaging through the chest, abdomen and pelvis was performed using the standard protocol during bolus administration of intravenous contrast. Multiplanar reconstructed images and MIPs were obtained and reviewed to evaluate the vascular anatomy. CONTRAST:  100 cc Isovue 370 IV COMPARISON:  07/10/2016 CT abdomen and pelvis, chest CT 06/21/2015 FINDINGS: CTA CHEST FINDINGS Cardiovascular: Satisfactory opacification of the thoracic aorta and pulmonary arteries. No evidence of acute pulmonary embolus. No aortic aneurysm or dissection. Aortic atherosclerosis and coronary arteriosclerosis is identified. Heart is borderline enlarged without pericardial effusion. Great vessels are patent with normal branch pattern. Mediastinum/Nodes: No enlarged mediastinal, hilar, or axillary lymph nodes. Thyroid gland, trachea, and esophagus demonstrate no significant findings. Lungs/Pleura: Stable scattered peripheral interstitial change of the lungs. Subpleural right lower lobe 6 mm nodule is unchanged with probable  atelectasis along the right major fissure having accounted for and 11 mm density on prior study. This is less prominent on current exam. No significant effusion. No pneumothorax nor overt pulmonary edema. No pulmonary consolidations. Musculoskeletal: No acute nor suspicious osseous lesions. Chronic healed fracture deformity of the upper sternum likely related to old remote trauma. Stable thoracolumbar spondylosis. Review of the MIP images confirms the above findings. CTA ABDOMEN AND PELVIS FINDINGS VASCULAR Aorta: Aortic atherosclerosis without aneurysm or dissection. No significant stenosis. Celiac: Patent without evidence of aneurysm, dissection, vasculitis or significant stenosis. SMA: Patent without evidence of aneurysm, dissection, vasculitis or significant stenosis. Renals: Both renal arteries are patent without evidence of aneurysm, dissection, vasculitis, fibromuscular dysplasia or significant stenosis. IMA: Patent without evidence of aneurysm, dissection, vasculitis or significant stenosis. Inflow: Atherosclerosis of the common iliac arteries and branch vessels without significant stenosis or aneurysm. Veins: No obvious venous abnormality within the limitations of this arterial phase study. Review of the MIP images confirms the above findings. NON-VASCULAR Hepatobiliary: Layering gallstones within a physiologically distended gallbladder. No wall thickening or pericholecystic fluid. No enhancing hepatic mass or biliary dilatation. No choledocholithiasis. Pancreas: Atrophic without ductal dilatation or mass. Spleen: Small splenule at the splenic hilum. No splenomegaly. No focal mass. Adrenals/Urinary Tract: Normal bilateral adrenal glands and kidneys without obstructive uropathy. Simple 16 mm left upper pole renal cyst. Smaller hypodensities are noted within both kidneys too small to further characterize the largest in the upper pole of the right kidney estimated at 12 mm. No hydroureteronephrosis.  Physiologic distention of the bladder. Stomach/Bowel: The stomach is moderately distended with food. There is normal small bowel rotation without obstruction or inflammation. Scattered colonic diverticulosis along the transverse, descending and sigmoid colon with a moderate amount of  fecal retention within the left colon to the level of the rectum. Lymphatic: No pelvic, inguinal, retroperitoneal, abdominal or mesenteric lymphadenopathy by CT size criteria. Reproductive: Prostate is unremarkable. Other: No abdominal wall hernia or abnormality. Status post ventral mesh repair of hernia. No abdominopelvic ascites. Musculoskeletal: No lytic or blastic disease. Lumbar spondylosis with multilevel lumbar facet arthropathy. Review of the MIP images confirms the above findings. IMPRESSION: 1. No thoracic aortic aneurysm or dissection. 2. No acute pulmonary embolus. 3. Stable subpleural 6 mm nodule in the right lower lobe though it has only been 1 month since the comparison study. Non-contrast chest CT at 6-12 months is therefore now recommended. If the nodule is stable at time of repeat CT, then future CT at 18-24 months (from today's scan) is considered optional for low-risk patients, but is recommended for high-risk patients. This recommendation follows the consensus statement: Guidelines for Management of Incidental Pulmonary Nodules Detected on CT Images: From the Fleischner Society 2017; Radiology 2017; 284:228-243. 4. Coronary arteriosclerosis. 5. Aortoiliac and branch vessel atherosclerosis without significant stenosis or dissection. No aneurysm. 6. Uncomplicated cholelithiasis. 7. Bilateral renal cysts. 8. Colonic diverticulosis without acute diverticulitis. No acute bowel inflammation. 9. Prior ventral mesh repair of hernia. Electronically Signed   By: Ashley Royalty M.D.   On: 07/20/2016 21:03    EKG: Atrial fibrillation at 55  bpm with normal axis, intraventricular conduction delay, T-wave inversions in V3  through V6,  and nonspecific ST-T wave changes.   IMPRESSION AND PLAN:  This is a 83 y.o. male with a history of Atrial fibrillation, COPD, coronary artery disease, hypertension, hyperlipidemia, diabetes, systolic congestive heart failure now being admitted with:  1. Atypical chest pain, ischemic EKG changes and recent diagnosis of stress-induced cardiomyopathy and systolic heart failure with an EF of 25-30%  - Admit to observation with telemetry monitoring. - Trend troponins, check lipids and TSH. - Cardiology consult requested.  -continue Lipitor, Coreg, lisinopril, aspirin  - Morphine and nitro ordered, Zofran as needed.  2. H/o Diabetes - Accuchecks achs with RISS coverage - Heart healthy, carb controlled diet -Hold glimepiride  3. History of depression -Continue Paxil  4. History of essential tremor -Continue primidone  Admission status: Observation, telemetry Diet/Nutrition: Heart healthy Fluids: HL DVT Heparin, SCDs and early ambulation Code Status: Full Disposition Plan: To home in <24 hours  All the records are reviewed and case discussed with ED provider. Management plans discussed with the patient and/or family who express understanding and agree with plan of care.   TOTAL TIME TAKING CARE OF THIS PATIENT: 60 minutes.   Genessa Beman D.O. on 07/21/2016 at 12:07 AM Between 7am to 6pm - Pager - [phone] After 6pm go to [email or web address] - Proofreader [facility] Office [phone] CC: Primary care physician; Coral Spikes, DO     Note: This dictation was prepared with Dragon dictation along with smaller phrase technology. Any transcriptional errors that result from this process are unintentional.

## 2016-07-21 NOTE — Care Management Obs Status (Signed)
[place] NOTIFICATION   Patient Details  Name: Chase Simmons MRN: [id number] Date of Birth: 18-Aug-1933   Medicare Observation Status Notification Given:  Yes Notice given and explained an patient said "just leave it. I do not need to sign it."  Left notice in room with  CM contact information should patient or family later have questions  Katrina Stack, RN 07/21/2016, 3:56 PM

## 2016-07-21 NOTE — Care Management (Signed)
Placed in observation for chest pain and nausea and vomiting.  Presented from [facility].

## 2016-07-21 NOTE — Progress Notes (Signed)
[name] L, transferred from the ED following c/o worsening nausea. On admission patient was A&O X4, denied being in pain or any discomfort , SBP in the 150s, and maintained HR in the low 50s. Admission documentation completed with patient. PRN Xanax administered per patient request. Initial troponin of <0.03, new troponin of 0.04 was reported to Dr. Estanislado Pandy. maintenance fluid infusing, and patient kept NPO per order.

## 2016-07-22 ENCOUNTER — Observation Stay: Payer: Medicare HMO

## 2016-07-22 DIAGNOSIS — R112 Nausea with vomiting, unspecified: Secondary | ICD-10-CM | POA: Diagnosis not present

## 2016-07-22 DIAGNOSIS — K8020 Calculus of gallbladder without cholecystitis without obstruction: Secondary | ICD-10-CM | POA: Diagnosis not present

## 2016-07-22 DIAGNOSIS — J449 Chronic obstructive pulmonary disease, unspecified: Secondary | ICD-10-CM | POA: Diagnosis not present

## 2016-07-22 DIAGNOSIS — I2510 Atherosclerotic heart disease of native coronary artery without angina pectoris: Secondary | ICD-10-CM | POA: Diagnosis not present

## 2016-07-22 DIAGNOSIS — I10 Essential (primary) hypertension: Secondary | ICD-10-CM | POA: Diagnosis not present

## 2016-07-22 LAB — CBC
HCT: 34.8 % — ABNORMAL LOW (ref 40.0–52.0)
Hemoglobin: 11.9 g/dL — ABNORMAL LOW (ref 13.0–18.0)
MCH: 35.1 pg — ABNORMAL HIGH (ref 26.0–34.0)
MCHC: 34.3 g/dL (ref 32.0–36.0)
MCV: 102.3 fL — ABNORMAL HIGH (ref 80.0–100.0)
Platelets: 178 10*3/uL (ref 150–440)
RBC: 3.4 MIL/uL — ABNORMAL LOW (ref 4.40–5.90)
RDW: 13.5 % (ref 11.5–14.5)
WBC: 6.4 10*3/uL (ref 3.8–10.6)

## 2016-07-22 LAB — GLUCOSE, CAPILLARY
GLUCOSE-CAPILLARY: 126 mg/dL — AB (ref 65–99)
GLUCOSE-CAPILLARY: 134 mg/dL — AB (ref 65–99)
Glucose-Capillary: 133 mg/dL — ABNORMAL HIGH (ref 65–99)
Glucose-Capillary: 131 mg/dL — ABNORMAL HIGH (ref 65–99)

## 2016-07-22 LAB — BASIC METABOLIC PANEL
Anion gap: 4 — ABNORMAL LOW (ref 5–15)
BUN: 21 mg/dL — ABNORMAL HIGH (ref 6–20)
CALCIUM: 8.5 mg/dL — AB (ref 8.9–10.3)
CO2: 29 mmol/L (ref 22–32)
CREATININE: 1.17 mg/dL (ref 0.61–1.24)
Chloride: 107 mmol/L (ref 101–111)
GFR calc non Af Amer: 57 mL/min — ABNORMAL LOW (ref >60)
Glucose, Bld: 146 mg/dL — ABNORMAL HIGH (ref 65–99)
Potassium: 4.1 mmol/L (ref 3.5–5.1)
SODIUM: 140 mmol/L (ref 135–145)

## 2016-07-22 MED ORDER — GI COCKTAIL ~~LOC~~: 30.0000 mL | Freq: Four times a day (QID) | ORAL | 0 refills | Status: DC | PRN

## 2016-07-22 MED ORDER — PANTOPRAZOLE SODIUM 40 MG PO TBEC: 40.0000 mg | DELAYED_RELEASE_TABLET | Freq: Every day | ORAL | 2 refills | Status: DC

## 2016-07-22 MED ORDER — AMLODIPINE BESYLATE 5 MG PO TABS: 5.0000 mg | ORAL_TABLET | Freq: Every day | ORAL | 2 refills | Status: DC

## 2016-07-22 MED ORDER — AMLODIPINE BESYLATE 5 MG PO TABS
5.0000 mg | ORAL_TABLET | Freq: Every day | ORAL | Status: DC
  Administered 2016-07-22 – 2016-07-24 (×3): 5 mg via ORAL
  Filled 2016-07-22 (×3): qty 1

## 2016-07-22 NOTE — Evaluation (Signed)
Occupational Therapy Evaluation Patient Details Name: Chase Simmons MRN: [id number] DOB: 05-18-1933 Today's Date: 07/22/2016    History of Present Illness Pt. is an 83 y.o. male who was admitted from [facility] for Nausea secondary to gallbladder disease. Pt. had a recent hospitalization for symiliar issues. Pt. PMHx includes: Bladder CA, A-Fib, COPD, essential Tremor, Urinary incontinence.   Clinical Impression   Pt. Presents with weakness, essential tremor, pain, and limited functional mobility which hinder his ability to complete ADL, and IADL functioning. Pt. Could benefit from skilled OT services to improve ADL performance, and increase independence in order to return to his PLOF safely. Pt. was admitted to [facility] from [facility]. Pt. Family is requesting another SNF facility upon discharge. Pt. Could benefit from follow-up OT services upon discharge.    Follow Up Recommendations  SNF    Equipment Recommendations       Recommendations for Other Services       Precautions / Restrictions Precautions Precautions: Fall Precaution Comments: R radial heart cath on 7/9: RUE minimize use within first 48 hours, no push/pull/lifting >5-10lbs, platform walker if needs AD, gradually increase use of RUE after 1 week Restrictions Weight Bearing Restrictions: Yes             ADL either performed or assessed with clinical judgement   ADL Overall ADL's : Needs assistance/impaired Eating/Feeding: Set up;Sitting   Grooming: Set up;Sitting   Upper Body Bathing: Sitting;Supervision/ safety   Lower Body Bathing: Moderate assistance   Upper Body Dressing : Minimal assistance   Lower Body Dressing: Moderate assistance               Functional mobility during ADLs: Min guard General ADL Comments: Pt. education was provided about UE tremors, positioning, and A/E use for LE ADLs.     Vision Baseline Vision/History: Cataracts (Right eye low vision, left eye cataract  surgery.) Patient Visual Report: No change from baseline       Perception     Praxis      Pertinent Vitals/Pain Denies Pain     Hand Dominance Right   Extremity/Trunk Assessment Upper Extremity Assessment Upper Extremity Assessment: Generalized weakness RUE Deficits / Details: Proximally pt. is unable to initiate AROM in right shoulder. PROM to 90 degrees, distally overall 3+/5 strength. LUE Deficits / Details: LUE strength grossly 4/5   Lower Extremity Assessment Lower Extremity Assessment:  (BLE strength grossly 4/5)   Cervical / Trunk Assessment Cervical / Trunk Assessment: Kyphotic   Communication Communication Communication: HOH   Cognition Arousal/Alertness: Awake/alert Behavior During Therapy: WFL for tasks assessed/performed Overall Cognitive Status: Within Functional Limits for tasks assessed                                     General Comments  Abrasion near L elbow from previous fall, appears to be healing well.    Exercises     Shoulder Instructions      Home Living Family/patient expects to be discharged to:: Skilled nursing facility Living Arrangements: Alone Available Help at Discharge: Family;Available PRN/intermittently Type of Home: Independent living facility Home Access: Elevator;Stairs to enter Entrance Stairs-Number of Steps: 14 Entrance Stairs-Rails: Can reach both Home Layout: One level     Bathroom Shower/Tub: Teacher, early years/pre: Standard     Home Equipment: Environmental consultant - 2 wheels;Bedside commode;Wheelchair - manual   Additional Comments: apartment on 2nd floor, 2 flights  of stairs very close to apartment, elevator on the other end of the hallway that is difficult to get to from w/c level without assist.      Prior Functioning/Environment Level of Independence: Needs assistance  Gait / Transfers Assistance Needed: Pt reports he has not been ambulating at [facility].  He says he has been self propelling WC  x100 ft.  He trialed ambulating at [facility] x1 but pt says he has not done so since.   ADL's / Homemaking Assistance Needed: Pt. had assist with ADLs PTA. Pt reports he has assist with bathing.  He dresses independently.  Meals provided at [employer]. Assist with medication management. Pt. reports at home he uses the w/c mostly.             OT Problem List: Decreased strength;Decreased safety awareness;Decreased activity tolerance;Decreased knowledge of use of DME or AE;Impaired balance (sitting and/or standing);Impaired vision/perception;Impaired UE functional use      OT Treatment/Interventions: Self-care/ADL training;Therapeutic exercise;Therapeutic activities;Energy conservation;DME and/or AE instruction;Patient/family education    OT Goals(Current goals can be found in the care plan section) Acute Rehab OT Goals Patient Stated Goal: to get better  OT Frequency: Min 1X/week   Barriers to D/C: Decreased caregiver support;Inaccessible home environment          Co-evaluation              AM-PAC PT "6 Clicks" Daily Activity     Outcome Measure Help from another person eating meals?: None Help from another person taking care of personal grooming?: None Help from another person toileting, which includes using toliet, bedpan, or urinal?: A Lot Help from another person bathing (including washing, rinsing, drying)?: A Lot Help from another person to put on and taking off regular upper body clothing?: A Little Help from another person to put on and taking off regular lower body clothing?: A Lot 6 Click Score: 17   End of Session    Activity Tolerance: Patient tolerated treatment well Patient left: in bed;with call bell/phone within reach;with bed alarm set  OT Visit Diagnosis: Muscle weakness (generalized) (M62.81);Repeated falls (R29.6)                Time: 1338-1400 OT Time Calculation (min): 22 min Charges:  OT General Charges $OT Visit: 1 Procedure OT Evaluation $OT  Eval Moderate Complexity: 1 Procedure G-Codes: OT G-codes **NOT FOR INPATIENT CLASS** Functional Limitation: Self care Self Care Current Status (G1829): At least 20 percent but less than 40 percent impaired, limited or restricted Self Care Goal Status (H3716): At least 1 percent but less than 20 percent impaired, limited or restricted   Harrel Carina, MS, OTR/L   Harrel Carina, MS, OTR/L 07/22/2016, 4:13 PM

## 2016-07-22 NOTE — NC FL2 (Signed)
[place] LEVEL OF CARE SCREENING TOOL     IDENTIFICATION  Patient Name: Chase Simmons Birthdate: 03-24-1933 Sex: male Admission Date (Current Location): 07/20/2016  [place] and [place] Number:  Engineering geologist and Address:  [facility], [address], [place], [place] [address]      Provider Number: [id number]  Attending Physician Name and Address:  Gladstone Lighter, MD  Relative Name and Phone Number:  [facility] Daughter [phone] or [place], [place] [phone]     Current Level of Care: Hospital Recommended Level of Care: [place] Prior Approval Number:    Date Approved/Denied:   PASRR Number: [id number]  Discharge Plan: SNF    Current Diagnoses: Patient Active Problem List   Diagnosis Date Noted  . Atypical chest pain 07/20/2016  . Elevated troponin 07/10/2016  . Nausea & vomiting 07/10/2016  . Diarrhea 07/10/2016  . Persistent atrial fibrillation ([place]) 07/10/2016  . NSTEMI (non-ST elevated myocardial infarction) ([place]) 07/10/2016  . Fall 07/07/2016  . COPD (chronic obstructive pulmonary disease) ([place]) 03/15/2016  . Coronary atherosclerosis of native coronary artery 01/05/2016  . Aortic atherosclerosis ([place]) 01/05/2016  . Bradycardia 01/05/2016  . Anxiety 06/17/2015  . Malignant neoplasm of urinary bladder ([place]) 06/17/2015  . Allergic rhinitis 05/14/2015  . Knee pain, bilateral 03/16/2015  . PAF (paroxysmal atrial fibrillation) ([place]) 08/30/2013  . Acid reflux 08/30/2013  . Tremor, essential 10/22/2012  . Hyperlipidemia 10/22/2012  . HTN (hypertension) 08/01/2012  . Type 2 diabetes mellitus with complication, without long-term current use of insulin ([place]) 08/01/2012  . Benign prostatic hyperplasia with urinary obstruction 09/02/2011    Orientation RESPIRATION BLADDER Height & Weight     Self, Place  Normal Continent Weight: 238 lb (108 kg) Height:  5\' 11"  (180.3 cm)  BEHAVIORAL  SYMPTOMS/MOOD NEUROLOGICAL BOWEL NUTRITION STATUS      Continent Diet (Low Sodium and Heart Healthy)  AMBULATORY STATUS COMMUNICATION OF NEEDS Skin   Limited Assist Verbally Normal                       Personal Care Assistance Level of Assistance  Bathing, Feeding, Dressing Bathing Assistance: Limited assistance Feeding assistance: Independent Dressing Assistance: Limited assistance     Functional Limitations Info  Sight, Hearing, Speech Sight Info: Adequate Hearing Info: Adequate Speech Info: Adequate    SPECIAL CARE FACTORS FREQUENCY  PT (By licensed PT), OT (By licensed OT)     PT Frequency: 5x a week OT Frequency: 5x a week            Contractures Contractures Info: Not present    Additional Factors Info  Insulin Sliding Scale, Psychotropic Code Status Info: Full Code Allergies Info: Latex Psychotropic Info: Paroxetine (Paxil) tablet 40 mg Insulin Sliding Scale Info: insulin aspart (novoLOG) injection 0-20 Units 3x a day with meals       Current Medications (07/22/2016):  This is the current hospital active medication list Current Facility-Administered Medications  Medication Dose Route Frequency Provider Last Rate Last Dose  . 0.9 %  sodium chloride infusion   Intravenous Continuous Hugelmeyer, Alexis, DO 75 mL/hr at 07/22/16 0556    . acetaminophen (TYLENOL) tablet 650 mg  650 mg Oral Q6H PRN Hugelmeyer, Alexis, DO      . ALPRAZolam Duanne Moron) tablet 0.25 mg  0.25 mg Oral BID PRN Hugelmeyer, Alexis, DO   0.25 mg at 07/21/16 2232  . amLODipine (NORVASC) tablet 5 mg  5 mg Oral Daily Gladstone Lighter, MD  5 mg at 07/22/16 1139  . aspirin EC tablet 81 mg  81 mg Oral Daily Hugelmeyer, Alexis, DO   81 mg at 07/22/16 1000  . atorvastatin (LIPITOR) tablet 80 mg  80 mg Oral q1800 Hugelmeyer, Alexis, DO   80 mg at 07/21/16 1816  . fentaNYL (SUBLIMAZE) injection 50 mcg  50 mcg Intravenous Once Darel Hong, MD      . gi cocktail (Maalox,Lidocaine,Donnatal)  30 mL  Oral QID PRN Hugelmeyer, Alexis, DO      . heparin injection 5,000 Units  5,000 Units Subcutaneous Q8H Hugelmeyer, Alexis, DO   5,000 Units at 07/22/16 0548  . insulin aspart (novoLOG) injection 0-20 Units  0-20 Units Subcutaneous TID WC Hugelmeyer, Alexis, DO   3 Units at 07/22/16 1200  . insulin aspart (novoLOG) injection 0-5 Units  0-5 Units Subcutaneous QHS Hugelmeyer, Alexis, DO   2 Units at 07/21/16 0114  . ipratropium-albuterol (DUONEB) 0.5-2.5 (3) MG/3ML nebulizer solution 3 mL  3 mL Nebulization Q6H PRN Hugelmeyer, Alexis, DO      . lisinopril (PRINIVIL,ZESTRIL) tablet 10 mg  10 mg Oral Daily Hugelmeyer, Alexis, DO   10 mg at 07/22/16 1012  . metoCLOPramide (REGLAN) injection 10 mg  10 mg Intravenous Q6H PRN Gladstone Lighter, MD      . nitroGLYCERIN (NITROSTAT) SL tablet 0.4 mg  0.4 mg Sublingual Q5 min PRN Hugelmeyer, Alexis, DO      . ondansetron (ZOFRAN) injection 4 mg  4 mg Intravenous Q6H Gladstone Lighter, MD   4 mg at 07/22/16 1139  . pantoprazole (PROTONIX) EC tablet 40 mg  40 mg Oral Daily Gladstone Lighter, MD   40 mg at 07/22/16 1012  . PARoxetine (PAXIL) tablet 40 mg  40 mg Oral Daily Hugelmeyer, Alexis, DO   40 mg at 07/22/16 1012  . primidone (MYSOLINE) tablet 75 mg  75 mg Oral BID Hugelmeyer, Alexis, DO   75 mg at 07/22/16 1017  . zolpidem (AMBIEN) tablet 5 mg  5 mg Oral QHS PRN Hugelmeyer, Alexis, DO         Discharge Medications: Please see discharge summary for a list of discharge medications.  Relevant Imaging Results:  Relevant Lab Results:   Additional Information SSN [id number]  Ross Ludwig, [place]

## 2016-07-22 NOTE — Care Management (Signed)
Physical therapy has recommended skilled nursing facility placement.  CSW is working to obtain [place] from [facility] for this observation patient

## 2016-07-22 NOTE — Clinical Social Work Note (Signed)
Clinical Social Work Assessment  Patient Details  Name: Chase Simmons MRN: [id number] Date of Birth: May 05, 1933  Date of referral:  07/21/16               Reason for consult:  Facility Placement                Permission sought to share information with:  Family Supports, Customer service manager Permission granted to share information::  Yes, Verbal Permission Granted  Name::     [name], [name] daughter [phone] or [name] [phone]  Agency::  SNF admissions  Relationship::     Contact Information:     Housing/Transportation Living arrangements for the past 2 months:  [place], [place] of Information:  Adult Children, Patient Patient Interpreter Needed:  None Criminal Activity/Legal Involvement Pertinent to Current Situation/Hospitalization:  No - Comment as needed Significant Relationships:  Adult Children Lives with:  Self Do you feel safe going back to the place where you live?  No Need for family participation in patient care:  Yes (Comment)  Care giving concerns:  Patient and family feel patient needs to continue with therapy before he is ready to return home.   Social Worker assessment / plan:  Patient is an 83 year old male who is alert and oriented x4.  Patient was at [facility] and patient was supposed to be discharged home with home health, but then patient was readmitted to hospital.  Patient would not like to return back to [facility], family is interested in [facility] SNF.  CSW explained to patient's daughter that depending on if insurance will approve will determine if he is able to return to SNF.  CSW explained that if insurance company does not approve patient he may have to return back home with home health.  Patient's daughter expressed understanding, and asked CSW to begin bed search for a different SNF.  Patient's daughter did not express any other questions or  concerns.  Employment status:  Retired Nurse, adult PT Recommendations:  [place] / Referral to community resources:  [place]  Patient/Family's Response to care:  Patient and family in agreement to returning to a SNF.  Patient/Family's Understanding of and Emotional Response to Diagnosis, Current Treatment, and Prognosis:  Patient and his family are aware of current treatment plan.  Emotional Assessment Appearance:  Appears stated age Attitude/Demeanor/Rapport:    Affect (typically observed):  Appropriate, Calm Orientation:  Oriented to Self, Oriented to Place, Oriented to  Time, Oriented to Situation Alcohol / Substance use:  Not Applicable Psych involvement (Current and /or in the community):  No (Comment)  Discharge Needs  Concerns to be addressed:  Lack of Support Readmission within the last 30 days:  Yes (07-13-16 [facility] [place]) Current discharge risk:  Lack of support system Barriers to Discharge:  Continued Medical Work up, [employer]   Ross Ludwig, [place] 07/22/2016, 6:07 PM

## 2016-07-22 NOTE — Evaluation (Signed)
Physical Therapy Evaluation Patient Details Name: Chase Simmons MRN: [id number] DOB: [birth year] Today's Date: 07/22/2016   History of Present Illness  Pt is a 83 y/o M who presented from [facility] for evaluation of nausea. Suspect that nausea was a result of gallbladder disease. Pt was recently admitted with similar symptoms.  Pt's PMH includes bladder cancer, a-fib, COPD, essential tremor, urinary incontinence.    Clinical Impression  Pt admitted with above diagnosis. Pt currently with functional limitations due to the deficits listed below (see PT Problem List). Chase Simmons presents with weakness BLEs and requires close min guard assist for transfers and ambulation.  He rises slowly to stand with sit>stand and requires cues to reach back for armrests when preparing to sit.  He demonstrates mild instability while ambulating 80 ft with RW, requiring close min guard assist.  Given pt's current mobility status, recommending SNF at d/c.  Pt will benefit from skilled PT to increase their independence and safety with mobility to allow discharge to the venue listed below.      Follow Up Recommendations SNF    Equipment Recommendations  Other (comment) (TBD at next venue of care)    Recommendations for Other Services       Precautions / Restrictions Precautions Precautions: Fall Restrictions Weight Bearing Restrictions: No      Mobility  Bed Mobility Overal bed mobility: Needs Assistance Bed Mobility: Supine to Sit     Supine to sit: Supervision     General bed mobility comments: Pt requires increased time and effort.  Supervision for safety.  Transfers Overall transfer level: Needs assistance Equipment used: Rolling walker (2 wheeled) Transfers: Sit to/from Stand Sit to Stand: Min guard         General transfer comment: Pt rises slowly with flexed posture.  Cues provided for upright posture once pt standing.  Cues to reach back for armrests when preparing to  sit.  Ambulation/Gait Ambulation/Gait assistance: Min guard Ambulation Distance (Feet): 80 Feet Assistive device: Rolling walker (2 wheeled) Gait Pattern/deviations: Step-through pattern;Decreased weight shift to left;Decreased step length - right;Decreased stance time - left;Trunk flexed;Antalgic Gait velocity: decreased Gait velocity interpretation: Below normal speed for age/gender General Gait Details: Pt reports L knee pain and demonstrates flexed posture.  He fatigues quickly and demonstrates mild instability throughout, requiring min guard for safety.  Stairs            Wheelchair Mobility    Modified Rankin (Stroke Patients Only)       Balance Overall balance assessment: Needs assistance;History of Falls Sitting-balance support: No upper extremity supported;Feet supported Sitting balance-Leahy Scale: Good     Standing balance support: Bilateral upper extremity supported;During functional activity Standing balance-Leahy Scale: Poor Standing balance comment: Pt relies on UE support for static and dynamic activities                             Pertinent Vitals/Pain Pain Assessment: Faces Faces Pain Scale: Hurts little more Pain Location: Chronic L knee pain Pain Descriptors / Indicators: Aching Pain Intervention(s): Limited activity within patient's tolerance;Monitored during session;Repositioned    Home Living Family/patient expects to be discharged to:: Skilled nursing facility               Home Equipment: Gilford Rile - 2 wheels;Bedside commode;Wheelchair - manual      Prior Function Level of Independence: Needs assistance   Gait / Transfers Assistance Needed: Pt reports he has not been ambulating at  [facility].  He says he has been self propelling WC x100 ft.  He trialed ambulating at [facility] x1 but pt says he has not doen so since.    ADL's / Homemaking Assistance Needed: Pt reports he has assist with bathing.  He dresses independently.  Meals  provided at [facility].        Hand Dominance        Extremity/Trunk Assessment   Upper Extremity Assessment Upper Extremity Assessment: RUE deficits/detail;LUE deficits/detail RUE Deficits / Details: R shoulder flexion limited to ~90 deg flexion due to h/o chronic R shoulder pain.  Otherwise strength grossly 3+/5 LUE Deficits / Details: LUE strength grossly 4/5    Lower Extremity Assessment Lower Extremity Assessment:  (BLE strength grossly 4/5)    Cervical / Trunk Assessment Cervical / Trunk Assessment: Kyphotic  Communication   Communication: HOH  Cognition Arousal/Alertness: Awake/alert Behavior During Therapy: WFL for tasks assessed/performed Overall Cognitive Status: Within Functional Limits for tasks assessed                                        General Comments General comments (skin integrity, edema, etc.): Abrasion near L elbow from previous fall, appears to be healing well.    Exercises     Assessment/Plan    PT Assessment Patient needs continued PT services  PT Problem List Decreased strength;Decreased activity tolerance;Decreased range of motion;Decreased balance;Decreased knowledge of use of DME;Decreased safety awareness;Pain       PT Treatment Interventions DME instruction;Gait training;Functional mobility training;Therapeutic activities;Therapeutic exercise;Balance training;Neuromuscular re-education;Patient/family education    PT Goals (Current goals can be found in the Care Plan section)  Acute Rehab PT Goals Patient Stated Goal: to get better PT Goal Formulation: With patient Time For Goal Achievement: 08/05/16 Potential to Achieve Goals: Good    Frequency Min 2X/week   Barriers to discharge        Co-evaluation               AM-PAC PT "6 Clicks" Daily Activity  Outcome Measure Difficulty turning over in bed (including adjusting bedclothes, sheets and blankets)?: A Little Difficulty moving from lying on back to  sitting on the side of the bed? : A Little Difficulty sitting down on and standing up from a chair with arms (e.g., wheelchair, bedside commode, etc,.)?: A Little Help needed moving to and from a bed to chair (including a wheelchair)?: A Little Help needed walking in hospital room?: A Little Help needed climbing 3-5 steps with a railing? : A Lot 6 Click Score: 17    End of Session Equipment Utilized During Treatment: Gait belt Activity Tolerance: Patient limited by fatigue Patient left: in chair;with call bell/phone within reach;with chair alarm set Nurse Communication: Mobility status PT Visit Diagnosis: Unsteadiness on feet (R26.81);Muscle weakness (generalized) (M62.81);Other abnormalities of gait and mobility (R26.89);History of falling (Z91.81)    Time: 1601-0932 PT Time Calculation (min) (ACUTE ONLY): 22 min   Charges:   PT Evaluation $PT Eval Low Complexity: 1 Procedure     PT G Codes:        Collie Siad PT, DPT 07/22/2016, 1:05 PM

## 2016-07-22 NOTE — Progress Notes (Signed)
New PT note to include g codes.      2016-08-08 1244  PT G-Codes **NOT FOR INPATIENT CLASS**  Functional Assessment Tool Used AM-PAC 6 Clicks Basic Mobility;Clinical judgement  Functional Limitation Mobility: Walking and moving around  Mobility: Walking and Moving Around Current Status [phone]) CK  Mobility: Walking and Moving Around Goal Status (A5525) CJ    Collie Siad PT, DPT

## 2016-07-22 NOTE — Discharge Summary (Signed)
[facility] [place] NAME: Chase Simmons    MR#:  [id number]  DATE OF BIRTH:  [birth year]  DATE OF ADMISSION:  07/20/2016   ADMITTING PHYSICIAN: Harvie Bridge, DO  DATE OF DISCHARGE: 07/22/2016  PRIMARY CARE PHYSICIAN: Coral Spikes, DO   ADMISSION DIAGNOSIS:   nausea  DISCHARGE DIAGNOSIS:   Active Problems:   Atypical chest pain   SECONDARY DIAGNOSIS:   Past Medical History:  Diagnosis Date  . Allergy   . Atrial fibrillation ([place]) 08/30/2013  . Cancer [facility]) pt unsure of year   bladder cancer-Dr. Jacqlyn Larsen  . COPD (chronic obstructive pulmonary disease) ([place])   . Coronary atherosclerosis of native coronary artery 01/05/2016  . Hyperlipidemia   . Hypertension   . Tremor, essential    sees [facility] neurologist  . Type 2 diabetes mellitus with complication, without long-term current use of insulin ([place]) 08/01/2012  . Urinary incontinence     HOSPITAL COURSE:   83 year old male with past medical history significant for atrial fibrillation, COPD, hypertension, diabetes, systolic congestive heart failure with EF of 25% presents to hospital secondary to sudden onset of nausea.  #1 nausea and vomiting-secondary to gallbladder disease. - intermittent nausea episodes, last time thought to be secondary to cardiac causes. However catheterization revealed nonobstructive coronary disease. -like a gallbladder issue as it has started while eating a fatty meal. -Right upper quadrant ultrasound with cholelithiasis.  -symptoms have resolved, tolerating a solid diet well now -continue on Protonix. Outpatient follow up with surgery.  #2 diabetes mellitus-continue amaryl at discharge.  #3 hypertension-on lisinopril, add norvasc  #4 essential tremors-and continue primidone  Physical Therapy consulted.   DISCHARGE CONDITIONS:   Guarded  CONSULTS OBTAINED:   None  DRUG ALLERGIES:   Allergies  Allergen Reactions  . Latex Itching     DISCHARGE MEDICATIONS:   Allergies as of 07/22/2016      Reactions   Latex Itching      Medication List    STOP taking these medications   carvedilol 3.125 MG tablet Commonly known as:  COREG   tiotropium 18 MCG inhalation capsule Commonly known as:  SPIRIVA     TAKE these medications   acetaminophen 325 MG tablet Commonly known as:  TYLENOL Take 2 tablets (650 mg total) by mouth every 6 (six) hours as needed for mild pain (or Fever >/= 101).   ALPRAZolam 0.25 MG tablet Commonly known as:  XANAX take 1 tablet by mouth every 12 hours if needed for anxiety   amLODipine 5 MG tablet Commonly known as:  NORVASC Take 1 tablet (5 mg total) by mouth daily.   aspirin EC 81 MG tablet Take 1 tablet (81 mg total) by mouth daily.   atorvastatin 80 MG tablet Commonly known as:  LIPITOR TAKE 1 TABLET ONE TIME DAILY   gi cocktail Susp suspension Take 30 mLs by mouth 4 (four) times daily as needed for indigestion (or chest pain). Shake well.   glimepiride 4 MG tablet Commonly known as:  AMARYL take 1 tablet by mouth WITH BREAKFAST.   ipratropium-albuterol 0.5-2.5 (3) MG/3ML Soln Commonly known as:  DUONEB Take 3 mLs by nebulization every 6 (six) hours as needed.   lisinopril 10 MG tablet Commonly known as:  PRINIVIL,ZESTRIL Take 1 tablet (10 mg total) by mouth daily.   pantoprazole 40 MG tablet Commonly known as:  PROTONIX Take 1 tablet (40 mg total) by mouth daily.   PARoxetine 40 MG tablet Commonly  known as:  PAXIL take 1 tablet by mouth once daily   primidone 50 MG tablet Commonly known as:  MYSOLINE Take 1.5 tablets (75 mg total) by mouth 2 (two) times daily.        DISCHARGE INSTRUCTIONS:   1. PCP follow-up within 1 week  DIET:   Cardiac diet  ACTIVITY:   Activity as tolerated  OXYGEN:   Home Oxygen: No.  Oxygen Delivery: room air  DISCHARGE LOCATION:   nursing home   If you experience worsening of your admission symptoms, develop  shortness of breath, life threatening emergency, suicidal or homicidal thoughts you must seek medical attention immediately by calling 911 or calling your MD immediately  if symptoms less severe.  You Must read complete instructions/literature along with all the possible adverse reactions/side effects for all the Medicines you take and that have been prescribed to you. Take any new Medicines after you have completely understood and accpet all the possible adverse reactions/side effects.   Please note  You were cared for by a hospitalist during your hospital stay. If you have any questions about your discharge medications or the care you received while you were in the hospital after you are discharged, you can call the unit and asked to speak with the hospitalist on call if the hospitalist that took care of you is not available. Once you are discharged, your primary care physician will handle any further medical issues. Please note that NO REFILLS for any discharge medications will be authorized once you are discharged, as it is imperative that you return to your primary care physician (or establish a relationship with a primary care physician if you do not have one) for your aftercare needs so that they can reassess your need for medications and monitor your lab values.    On the day of Discharge:  VITAL SIGNS:   Blood pressure (!) 185/72, pulse (!) 48, temperature 98.8 F (37.1 C), temperature source Oral, resp. rate 18, height 5\' 11"  (1.803 m), weight 108 kg (238 lb), SpO2 98 %.  PHYSICAL EXAMINATION:   GENERAL:  83 y.o.-year-old patient lying in the bed with no acute distress.  EYES: Pupils equal, round, reactive to light and accommodation. No scleral icterus. Extraocular muscles intact.  HEENT: Head atraumatic, normocephalic. Oropharynx and nasopharynx clear.  NECK:  Supple, no jugular venous distention. No thyroid enlargement, no tenderness.  LUNGS: Normal breath sounds bilaterally, no  wheezing, rales,rhonchi or crepitation. No use of accessory muscles of respiration. Decreased bibasilar breath sounds noted CARDIOVASCULAR: S1, S2 normal. No rubs, or gallops. 2/6 systolic murmur present ABDOMEN: Soft, nontender, nondistended. Bowel sounds present. No organomegaly or mass.  EXTREMITIES: No pedal edema, cyanosis, or clubbing.  NEUROLOGIC: Cranial nerves II through XII are intact. Muscle strength 5/5 in all extremities. Sensation intact. Gait not checked. Global weakness noted PSYCHIATRIC: The patient is alert and oriented x 2-3.  SKIN: No obvious rash, lesion, or ulcer.    DATA REVIEW:   CBC  Recent Labs Lab 07/22/16 0531  WBC 6.4  HGB 11.9*  HCT 34.8*  PLT 178    Chemistries   Recent Labs Lab 07/20/16 1859 07/22/16 0531  NA 137 140  K 4.2 4.1  CL 102 107  CO2 26 29  GLUCOSE 223* 146*  BUN 20 21*  CREATININE 1.07 1.17  CALCIUM 9.1 8.5*  AST 21  --   ALT 25  --   ALKPHOS 68  --   BILITOT 0.5  --  Microbiology Results  Results for orders placed or performed during the hospital encounter of 07/27/15  Urine culture     Status: Abnormal   Collection Time: 07/27/15  6:32 PM  Result Value Ref Range Status   Specimen Description URINE, CLEAN CATCH  Final   Special Requests NONE  Final   Culture >=100,000 COLONIES/mL ESCHERICHIA COLI (A)  Final   Report Status 07/30/2015 FINAL  Final   Organism ID, Bacteria ESCHERICHIA COLI (A)  Final      Susceptibility   Escherichia coli - MIC*    AMPICILLIN 4 SENSITIVE Sensitive     CEFAZOLIN <=4 SENSITIVE Sensitive     CEFTRIAXONE <=1 SENSITIVE Sensitive     CIPROFLOXACIN <=0.25 SENSITIVE Sensitive     GENTAMICIN <=1 SENSITIVE Sensitive     IMIPENEM <=0.25 SENSITIVE Sensitive     NITROFURANTOIN <=16 SENSITIVE Sensitive     TRIMETH/SULFA <=20 SENSITIVE Sensitive     AMPICILLIN/SULBACTAM 4 SENSITIVE Sensitive     PIP/TAZO <=4 SENSITIVE Sensitive     * >=100,000 COLONIES/mL ESCHERICHIA COLI    RADIOLOGY:    US Abdomen Limited Ruq  Result Date: 07/22/2016 CLINICAL DATA:  Right upper quadrant pain for 3 days EXAM: ULTRASOUND ABDOMEN LIMITED RIGHT UPPER QUADRANT COMPARISON:  CT abdomen and pelvis July 10, 2016 FINDINGS: Gallbladder: Within the gallbladder, there are multiple echogenic foci which move and shadow consistent with cholelithiasis. Largest gallstone measures 8 mm in length. There is no gallbladder wall thickening or pericholecystic fluid. No sonographic Murphy sign noted by sonographer. Common bile duct: Diameter: 5 mm. No intrahepatic or extrahepatic biliary duct dilatation. Liver: No focal lesion identified. Liver echogenicity overall is increased. IMPRESSION: Cholelithiasis. Increase in liver echogenicity, a finding most likely indicative of hepatic steatosis. While no focal liver lesions are evident on this study, it must be cautioned that sensitivity of ultrasound for detection of focal liver lesions is diminished in this circumstance. Electronically Signed   By: Lowella Grip III M.D.   On: 07/22/2016 09:11     Management plans discussed with the patient, family and they are in agreement.  CODE STATUS:     Code Status Orders        Start     Ordered   07/21/16 0050  Full code  Continuous     07/21/16 0049    Code Status History    Date Active Date Inactive Code Status Order ID Comments User Context   07/10/2016  9:34 AM 07/13/2016  5:19 PM Full Code [id number]  Saundra Shelling, MD Inpatient   06/19/2015 12:21 AM 06/23/2015 12:47 AM Full Code [id number]  Alesia Richards, MD ED   06/04/2015  8:13 PM 06/08/2015  6:33 PM Full Code [id number]  Loletha Grayer, MD ED      TOTAL TIME TAKING CARE OF THIS PATIENT: 38 minutes.    Kamryn Messineo M.D on 07/22/2016 at 10:24 AM  Between 7am to 6pm - Pager - [phone]  After 6pm go to [email or web address] - Proofreader  [employer] [facility]  Office  [phone]  CC: Primary care physician; Coral Spikes,  DO   Note: This dictation was prepared with Dragon dictation along with smaller phrase technology. Any transcriptional errors that result from this process are unintentional.

## 2016-07-23 DIAGNOSIS — I2510 Atherosclerotic heart disease of native coronary artery without angina pectoris: Secondary | ICD-10-CM | POA: Diagnosis not present

## 2016-07-23 DIAGNOSIS — J449 Chronic obstructive pulmonary disease, unspecified: Secondary | ICD-10-CM | POA: Diagnosis not present

## 2016-07-23 DIAGNOSIS — I10 Essential (primary) hypertension: Secondary | ICD-10-CM | POA: Diagnosis not present

## 2016-07-23 DIAGNOSIS — R112 Nausea with vomiting, unspecified: Secondary | ICD-10-CM | POA: Diagnosis not present

## 2016-07-23 LAB — GLUCOSE, CAPILLARY
GLUCOSE-CAPILLARY: 158 mg/dL — AB (ref 65–99)
GLUCOSE-CAPILLARY: 93 mg/dL (ref 65–99)
Glucose-Capillary: 144 mg/dL — ABNORMAL HIGH (ref 65–99)
Glucose-Capillary: 170 mg/dL — ABNORMAL HIGH (ref 65–99)

## 2016-07-23 NOTE — Progress Notes (Signed)
[place] [place] at [place] NAME: Chase Simmons    MR#:  [id number]  DATE OF BIRTH:  03-23-1933  SUBJECTIVE:  CHIEF COMPLAINT:   Chief Complaint  Patient presents with  . Nausea   - Recent admission for nausea and elevated troponin, cardiac catheterization with nonobstructive coronary disease. - Comes back again with nausea, started while eating a hotdog. Had gallbladder issues in the past. - no pain now, waiting for SNF placement.  REVIEW OF SYSTEMS:  Review of Systems  Constitutional: Negative for chills, fever and malaise/fatigue.  HENT: Positive for hearing loss. Negative for congestion, ear discharge and nosebleeds.   Eyes: Negative for blurred vision and double vision.  Respiratory: Negative for cough, shortness of breath and wheezing.   Cardiovascular: Negative for chest pain, palpitations and leg swelling.  Gastrointestinal: Positive for abdominal pain and nausea. Negative for constipation, diarrhea and vomiting.  Genitourinary: Negative for dysuria.  Musculoskeletal: Negative for myalgias.  Neurological: Negative for dizziness, sensory change, speech change, focal weakness, seizures and headaches.  Psychiatric/Behavioral: Negative for depression.    DRUG ALLERGIES:   Allergies  Allergen Reactions  . Latex Itching    VITALS:  Blood pressure (!) 142/78, pulse 60, temperature 98 F (36.7 C), temperature source Oral, resp. rate 20, height 5\' 11"  (1.803 m), weight 108 kg (238 lb), SpO2 98 %.  PHYSICAL EXAMINATION:  Physical Exam  GENERAL:  83 y.o.-year-old patient lying in the bed with no acute distress.  EYES: Pupils equal, round, reactive to light and accommodation. No scleral icterus. Extraocular muscles intact.  HEENT: Head atraumatic, normocephalic. Oropharynx and nasopharynx clear.  NECK:  Supple, no jugular venous distention. No thyroid enlargement, no tenderness.  LUNGS: Normal breath sounds bilaterally, no wheezing,  rales,rhonchi or crepitation. No use of accessory muscles of respiration. Decreased bibasilar breath sounds noted CARDIOVASCULAR: S1, S2 normal. No rubs, or gallops. 2/6 systolic murmur present ABDOMEN: Soft, nontender, nondistended. Bowel sounds present. No organomegaly or mass.  EXTREMITIES: No pedal edema, cyanosis, or clubbing.  NEUROLOGIC: Cranial nerves II through XII are intact. Muscle strength 5/5 in all extremities. Sensation intact. Gait not checked. Global weakness noted PSYCHIATRIC: The patient is alert and oriented x 2-3. Sometimes repeating the same sentence SKIN: No obvious rash, lesion, or ulcer.    LABORATORY PANEL:   CBC  Recent Labs Lab 07/22/16 0531  WBC 6.4  HGB 11.9*  HCT 34.8*  PLT 178   ------------------------------------------------------------------------------------------------------------------  Chemistries   Recent Labs Lab 07/20/16 1859 07/22/16 0531  NA 137 140  K 4.2 4.1  CL 102 107  CO2 26 29  GLUCOSE 223* 146*  BUN 20 21*  CREATININE 1.07 1.17  CALCIUM 9.1 8.5*  AST 21  --   ALT 25  --   ALKPHOS 68  --   BILITOT 0.5  --    ------------------------------------------------------------------------------------------------------------------  Cardiac Enzymes  Recent Labs Lab 07/21/16 0658  TROPONINI 0.04*   ------------------------------------------------------------------------------------------------------------------  RADIOLOGY:  US Abdomen Limited Ruq  Result Date: 07/22/2016 CLINICAL DATA:  Right upper quadrant pain for 3 days EXAM: ULTRASOUND ABDOMEN LIMITED RIGHT UPPER QUADRANT COMPARISON:  CT abdomen and pelvis July 10, 2016 FINDINGS: Gallbladder: Within the gallbladder, there are multiple echogenic foci which move and shadow consistent with cholelithiasis. Largest gallstone measures 8 mm in length. There is no gallbladder wall thickening or pericholecystic fluid. No sonographic Murphy sign noted by sonographer. Common bile  duct: Diameter: 5 mm. No intrahepatic or extrahepatic biliary duct dilatation. Liver:  No focal lesion identified. Liver echogenicity overall is increased. IMPRESSION: Cholelithiasis. Increase in liver echogenicity, a finding most likely indicative of hepatic steatosis. While no focal liver lesions are evident on this study, it must be cautioned that sensitivity of ultrasound for detection of focal liver lesions is diminished in this circumstance. Electronically Signed   By: Lowella Grip III M.D.   On: 07/22/2016 09:11    EKG:   Orders placed or performed during the hospital encounter of 07/20/16  . ED EKG  . ED EKG  . EKG 12-Lead  . EKG 12-Lead  . Repeat EKG  . Repeat EKG  . EKG 12-Lead (at 6am)  . EKG 12-Lead (Repeat cardiac markers, recurrent chest pain)  . EKG 12-Lead (at 6am)  . EKG 12-Lead (Repeat cardiac markers, recurrent chest pain)    ASSESSMENT AND PLAN:   83 year old male with past medical history significant for atrial fibrillation, COPD, hypertension, diabetes, systolic congestive heart failure with EF of 25% presents to hospital secondary to sudden onset of nausea.  #1 nausea-intermittent nausea episodes, last time thought to be secondary to cardiac causes. However catheterization revealed nonobstructive coronary disease. -Sounds more like a gallbladder versus gastritis issue as it has started while eating a fatty meal. -Right upper quadrant ultrasound ordered. Started on Zofran scheduled for a day with when necessary Reglan. -Started on clear liquids and advance as tolerated. -Started on Protonix.  - improved now.  #2 diabetes mellitus-continue sliding scale insulin.  #3 hypertension-on lisinopril  More for essential tremors-and continue primidone  #5 DVT prophylaxis-subcutaneous heparin    physical therapy consulted. Patient is currently at [facility]. - As per CSW- need authorization from insurance for SNF placement.   All the records are  reviewed and case discussed with Care Management/Social Workerr. Management plans discussed with the patient, family and they are in agreement.  CODE STATUS: Full code  TOTAL TIME TAKING CARE OF THIS PATIENT: 38 minutes.   POSSIBLE D/C IN 1-2 DAYS, DEPENDING ON CLINICAL CONDITION.   Vaughan Basta M.D on 07/23/2016 at 4:39 PM  Between 7am to 6pm - Pager - [phone]  After 6pm go to [email or web address] - password [email or web address] [facility]  Office  [phone]  CC: Primary care physician; Coral Spikes, DO

## 2016-07-23 NOTE — Progress Notes (Signed)
Patient alert and oriented, vss, no complaints of pain.  No significant events this shift.

## 2016-07-23 NOTE — Progress Notes (Signed)
CCMD reports 2.0 and 2.15 sec pause. MD Huglemeyer made aware. No interventions ordered.

## 2016-07-24 DIAGNOSIS — I10 Essential (primary) hypertension: Secondary | ICD-10-CM | POA: Diagnosis not present

## 2016-07-24 DIAGNOSIS — R112 Nausea with vomiting, unspecified: Secondary | ICD-10-CM | POA: Diagnosis not present

## 2016-07-24 DIAGNOSIS — I2510 Atherosclerotic heart disease of native coronary artery without angina pectoris: Secondary | ICD-10-CM | POA: Diagnosis not present

## 2016-07-24 DIAGNOSIS — J449 Chronic obstructive pulmonary disease, unspecified: Secondary | ICD-10-CM | POA: Diagnosis not present

## 2016-07-24 LAB — BASIC METABOLIC PANEL
Anion gap: 6 (ref 5–15)
BUN: 23 mg/dL — AB (ref 6–20)
CHLORIDE: 106 mmol/L (ref 101–111)
CO2: 27 mmol/L (ref 22–32)
CREATININE: 1.31 mg/dL — AB (ref 0.61–1.24)
Calcium: 8.5 mg/dL — ABNORMAL LOW (ref 8.9–10.3)
GFR calc Af Amer: 57 mL/min — ABNORMAL LOW (ref >60)
GFR calc non Af Amer: 49 mL/min — ABNORMAL LOW (ref >60)
Glucose, Bld: 168 mg/dL — ABNORMAL HIGH (ref 65–99)
POTASSIUM: 4.1 mmol/L (ref 3.5–5.1)
SODIUM: 139 mmol/L (ref 135–145)

## 2016-07-24 LAB — GLUCOSE, CAPILLARY
GLUCOSE-CAPILLARY: 139 mg/dL — AB (ref 65–99)
GLUCOSE-CAPILLARY: 341 mg/dL — AB (ref 65–99)
GLUCOSE-CAPILLARY: 138 mg/dL — AB (ref 65–99)
Glucose-Capillary: 112 mg/dL — ABNORMAL HIGH (ref 65–99)

## 2016-07-24 MED ORDER — LISINOPRIL 20 MG PO TABS
20.0000 mg | ORAL_TABLET | Freq: Every day | ORAL | Status: DC
  Administered 2016-07-25: 20 mg via ORAL
  Filled 2016-07-24: qty 1

## 2016-07-24 MED ORDER — AMLODIPINE BESYLATE 10 MG PO TABS
10.0000 mg | ORAL_TABLET | Freq: Every day | ORAL | Status: DC
  Filled 2016-07-24: qty 1

## 2016-07-24 MED ORDER — SODIUM CHLORIDE 0.9% FLUSH
3.0000 mL | Freq: Two times a day (BID) | INTRAVENOUS | Status: DC
  Administered 2016-07-24 – 2016-07-25 (×2): 3 mL via INTRAVENOUS

## 2016-07-24 MED ORDER — MAGNESIUM HYDROXIDE 400 MG/5ML PO SUSP
30.0000 mL | Freq: Every evening | ORAL | Status: DC | PRN
  Administered 2016-07-24 – 2016-07-25 (×2): 30 mL via ORAL
  Filled 2016-07-24 (×2): qty 30

## 2016-07-24 NOTE — Progress Notes (Signed)
[place] at [place] NAME: Chase Simmons    MR#:  [id number]  DATE OF BIRTH:  Apr 05, 1933  SUBJECTIVE:  CHIEF COMPLAINT:   Chief Complaint  Patient presents with  . Nausea   - Recent admission for nausea and elevated troponin, cardiac catheterization with nonobstructive coronary disease. - Comes back again with nausea, started while eating a hotdog. Had gallbladder issues in the past. - no pain now, waiting for SNF placement.   REVIEW OF SYSTEMS:  Review of Systems  Constitutional: Negative for chills, fever and malaise/fatigue.  HENT: Positive for hearing loss. Negative for congestion, ear discharge and nosebleeds.   Eyes: Negative for blurred vision and double vision.  Respiratory: Negative for cough, shortness of breath and wheezing.   Cardiovascular: Negative for chest pain, palpitations and leg swelling.  Gastrointestinal: Positive for abdominal pain and nausea. Negative for constipation, diarrhea and vomiting.  Genitourinary: Negative for dysuria.  Musculoskeletal: Negative for myalgias.  Neurological: Negative for dizziness, sensory change, speech change, focal weakness, seizures and headaches.  Psychiatric/Behavioral: Negative for depression.    DRUG ALLERGIES:   Allergies  Allergen Reactions  . Latex Itching    VITALS:  Blood pressure (!) 130/47, pulse (!) 55, temperature (!) 97.5 F (36.4 C), resp. rate 20, height 5\' 11"  (1.803 m), weight 108 kg (238 lb), SpO2 98 %.  PHYSICAL EXAMINATION:  Physical Exam  GENERAL:  83 y.o.-year-old patient lying in the bed with no acute distress.  EYES: Pupils equal, round, reactive to light and accommodation. No scleral icterus. Extraocular muscles intact.  HEENT: Head atraumatic, normocephalic. Oropharynx and nasopharynx clear.  NECK:  Supple, no jugular venous distention. No thyroid enlargement, no tenderness.  LUNGS: Normal breath sounds bilaterally, no wheezing, rales,rhonchi  or crepitation. No use of accessory muscles of respiration. Decreased bibasilar breath sounds noted CARDIOVASCULAR: S1, S2 normal. No rubs, or gallops. 2/6 systolic murmur present ABDOMEN: Soft, nontender, nondistended. Bowel sounds present. No organomegaly or mass.  EXTREMITIES: No pedal edema, cyanosis, or clubbing.  NEUROLOGIC: Cranial nerves II through XII are intact. Muscle strength 5/5 in all extremities. Sensation intact. Gait not checked. Global weakness noted PSYCHIATRIC: The patient is alert and oriented x 2-3. Sometimes repeating the same sentence SKIN: No obvious rash, lesion, or ulcer.    LABORATORY PANEL:   CBC  Recent Labs Lab 07/22/16 0531  WBC 6.4  HGB 11.9*  HCT 34.8*  PLT 178   ------------------------------------------------------------------------------------------------------------------  Chemistries   Recent Labs Lab 07/20/16 1859  07/24/16 0703  NA 137  < > 139  K 4.2  < > 4.1  CL 102  < > 106  CO2 26  < > 27  GLUCOSE 223*  < > 168*  BUN 20  < > 23*  CREATININE 1.07  < > 1.31*  CALCIUM 9.1  < > 8.5*  AST 21  --   --   ALT 25  --   --   ALKPHOS 68  --   --   BILITOT 0.5  --   --   < > = values in this interval not displayed. ------------------------------------------------------------------------------------------------------------------  Cardiac Enzymes  Recent Labs Lab 07/21/16 0658  TROPONINI 0.04*   ------------------------------------------------------------------------------------------------------------------  RADIOLOGY:  No results found.  EKG:   Orders placed or performed during the hospital encounter of 07/20/16  . ED EKG  . ED EKG  . EKG 12-Lead  . EKG 12-Lead  . Repeat EKG  . Repeat EKG  . EKG  12-Lead (at 6am)  . EKG 12-Lead (Repeat cardiac markers, recurrent chest pain)  . EKG 12-Lead (at 6am)  . EKG 12-Lead (Repeat cardiac markers, recurrent chest pain)    ASSESSMENT AND PLAN:   84 year old male with past  medical history significant for atrial fibrillation, COPD, hypertension, diabetes, systolic congestive heart failure with EF of 25% presents to hospital secondary to sudden onset of nausea.  #1 nausea-intermittent nausea episodes, last time thought to be secondary to cardiac causes. However catheterization revealed nonobstructive coronary disease. -Sounds more like a gallbladder versus gastritis issue as it has started while eating a fatty meal. -Right upper quadrant ultrasound ordered. Started on Zofran scheduled for a day with when necessary Reglan. -Started on clear liquids and advance as tolerated. -Started on Protonix.  - improved now.  #2 diabetes mellitus-continue sliding scale insulin.  #3 hypertension-on lisinopril    Due to high BP increased dose of lisinopril.  More for essential tremors-and continue primidone  #5 DVT prophylaxis-subcutaneous heparin    physical therapy consulted. Patient is currently at [facility]. - As per CSW- need authorization from insurance for SNF placement.   All the records are reviewed and case discussed with Care Management/Social Workerr. Management plans discussed with the patient, family and they are in agreement.  CODE STATUS: Full code  TOTAL TIME TAKING CARE OF THIS PATIENT: 30 minutes.   POSSIBLE D/C IN 1-2 DAYS, DEPENDING ON CLINICAL CONDITION. Waiting on insurance authorization for SNF placement.  Vaughan Basta M.D on 07/24/2016 at 2:18 PM  Between 7am to 6pm - Pager - [phone]  After 6pm go to [email or web address] - password [email or web address] [facility]  Office  [phone]  CC: Primary care physician; Coral Spikes, DO

## 2016-07-25 DIAGNOSIS — I10 Essential (primary) hypertension: Secondary | ICD-10-CM | POA: Diagnosis not present

## 2016-07-25 DIAGNOSIS — R262 Difficulty in walking, not elsewhere classified: Secondary | ICD-10-CM | POA: Diagnosis not present

## 2016-07-25 DIAGNOSIS — R6889 Other general symptoms and signs: Secondary | ICD-10-CM | POA: Diagnosis not present

## 2016-07-25 DIAGNOSIS — J449 Chronic obstructive pulmonary disease, unspecified: Secondary | ICD-10-CM | POA: Diagnosis not present

## 2016-07-25 DIAGNOSIS — I2510 Atherosclerotic heart disease of native coronary artery without angina pectoris: Secondary | ICD-10-CM | POA: Diagnosis not present

## 2016-07-25 DIAGNOSIS — R112 Nausea with vomiting, unspecified: Secondary | ICD-10-CM | POA: Diagnosis not present

## 2016-07-25 DIAGNOSIS — M6281 Muscle weakness (generalized): Secondary | ICD-10-CM | POA: Diagnosis not present

## 2016-07-25 DIAGNOSIS — Z743 Need for continuous supervision: Secondary | ICD-10-CM | POA: Diagnosis not present

## 2016-07-25 LAB — GLUCOSE, CAPILLARY
GLUCOSE-CAPILLARY: 134 mg/dL — AB (ref 65–99)
Glucose-Capillary: 264 mg/dL — ABNORMAL HIGH (ref 65–99)

## 2016-07-25 MED ORDER — ALPRAZOLAM 0.25 MG PO TABS: ORAL_TABLET | ORAL | 1 refills | Status: DC

## 2016-07-25 MED ORDER — AMLODIPINE BESYLATE 10 MG PO TABS: 10.0000 mg | ORAL_TABLET | Freq: Every day | ORAL | 0 refills | Status: DC

## 2016-07-25 MED ORDER — LISINOPRIL 20 MG PO TABS: 20.0000 mg | ORAL_TABLET | Freq: Every day | ORAL | 0 refills | Status: DC

## 2016-07-25 NOTE — Discharge Summary (Signed)
[facility] [place] NAME: Chase Simmons    MR#:  [id number]  DATE OF BIRTH:  18-May-1933  DATE OF ADMISSION:  07/20/2016 ADMITTING PHYSICIAN: Harvie Bridge, DO  DATE OF DISCHARGE: 07/25/2016   PRIMARY CARE PHYSICIAN: Coral Spikes, DO    ADMISSION DIAGNOSIS:  nausea  DISCHARGE DIAGNOSIS:  Active Problems:   Atypical chest pain   SECONDARY DIAGNOSIS:   Past Medical History:  Diagnosis Date  . Allergy   . Atrial fibrillation ([place]) 08/30/2013  . Cancer [facility]) pt unsure of year   bladder cancer-Dr. Jacqlyn Larsen  . COPD (chronic obstructive pulmonary disease) ([place])   . Coronary atherosclerosis of native coronary artery 01/05/2016  . Hyperlipidemia   . Hypertension   . Tremor, essential    sees [facility] neurologist  . Type 2 diabetes mellitus with complication, without long-term current use of insulin ([place]) 08/01/2012  . Urinary incontinence     HOSPITAL COURSE:   83 year old male with past medical history significant for atrial fibrillation, COPD, hypertension, diabetes, systolic congestive heart failure with EF of 25% presents to hospital secondary to sudden onset of nausea.  #1 nausea-intermittent nausea episodes, last time thought to be secondary to cardiac causes. However catheterization revealed nonobstructive coronary disease. -Sounds more like a gallbladder versus gastritis issue as it has started while eating a fatty meal. -Right upper quadrant ultrasound ordered. Started on Zofran scheduled for a day with when necessary Reglan. -Started on clear liquids and advance as tolerated. -Started on Protonix.  - improved now.  #2 diabetes mellitus-continue sliding scale insulin.  #3 hypertension-on lisinopril    Due to high BP increased dose of lisinopril.  More for essential tremors-and continue primidone  #5 DVT prophylaxis-subcutaneous heparin  #6- ch diastolic chf- cardiomyopathy    Have some bradycardia and pauses  up to 2 seconds, so cardiologist has suggested to not give beta blockers at this time.  DISCHARGE CONDITIONS:   Stable.  CONSULTS OBTAINED:    DRUG ALLERGIES:   Allergies  Allergen Reactions  . Latex Itching    DISCHARGE MEDICATIONS:   Current Discharge Medication List    START taking these medications   Details  Alum & Mag Hydroxide-Simeth (GI COCKTAIL) SUSP suspension Take 30 mLs by mouth 4 (four) times daily as needed for indigestion (or chest pain). Shake well. Qty: 200 mL, Refills: 0    amLODipine (NORVASC) 10 MG tablet Take 1 tablet (10 mg total) by mouth daily. Qty: 30 tablet, Refills: 0    pantoprazole (PROTONIX) 40 MG tablet Take 1 tablet (40 mg total) by mouth daily. Qty: 30 tablet, Refills: 2      CONTINUE these medications which have CHANGED   Details  ALPRAZolam (XANAX) 0.25 MG tablet take 1 tablet by mouth every 12 hours if needed for anxiety Qty: 20 tablet, Refills: 1    lisinopril (PRINIVIL,ZESTRIL) 20 MG tablet Take 1 tablet (20 mg total) by mouth daily. Qty: 30 tablet, Refills: 0      CONTINUE these medications which have NOT CHANGED   Details  atorvastatin (LIPITOR) 80 MG tablet TAKE 1 TABLET ONE TIME DAILY Qty: 90 tablet, Refills: 3    glimepiride (AMARYL) 4 MG tablet take 1 tablet by mouth WITH BREAKFAST. Qty: 90 tablet, Refills: 1    PARoxetine (PAXIL) 40 MG tablet take 1 tablet by mouth once daily Qty: 30 tablet, Refills: 0    primidone (MYSOLINE) 50 MG tablet Take 1.5 tablets (75 mg total) by mouth  2 (two) times daily. Qty: 270 tablet, Refills: 1    acetaminophen (TYLENOL) 325 MG tablet Take 2 tablets (650 mg total) by mouth every 6 (six) hours as needed for mild pain (or Fever >/= 101).    aspirin EC 81 MG tablet Take 1 tablet (81 mg total) by mouth daily. Qty: 90 tablet, Refills: 3    ipratropium-albuterol (DUONEB) 0.5-2.5 (3) MG/3ML SOLN Take 3 mLs by nebulization every 6 (six) hours as needed. Qty: 360 mL      STOP taking  these medications     carvedilol (COREG) 3.125 MG tablet      tiotropium (SPIRIVA) 18 MCG inhalation capsule          DISCHARGE INSTRUCTIONS:    Follow with PMD in 1-2 weeks.  If you experience worsening of your admission symptoms, develop shortness of breath, life threatening emergency, suicidal or homicidal thoughts you must seek medical attention immediately by calling 911 or calling your MD immediately  if symptoms less severe.  You Must read complete instructions/literature along with all the possible adverse reactions/side effects for all the Medicines you take and that have been prescribed to you. Take any new Medicines after you have completely understood and accept all the possible adverse reactions/side effects.   Please note  You were cared for by a hospitalist during your hospital stay. If you have any questions about your discharge medications or the care you received while you were in the hospital after you are discharged, you can call the unit and asked to speak with the hospitalist on call if the hospitalist that took care of you is not available. Once you are discharged, your primary care physician will handle any further medical issues. Please note that NO REFILLS for any discharge medications will be authorized once you are discharged, as it is imperative that you return to your primary care physician (or establish a relationship with a primary care physician if you do not have one) for your aftercare needs so that they can reassess your need for medications and monitor your lab values.    Today   CHIEF COMPLAINT:   Chief Complaint  Patient presents with  . Nausea    HISTORY OF PRESENT ILLNESS:  Chase Simmons  is a 83 y.o. male with a known history of Atrial fibrillation, COPD, coronary artery disease, hypertension, hyperlipidemia, diabetes, systolic congestive heart presents to the emergency department for evaluation of nausea.  Patient was in a usual state of  health until this afternoon when he was brought to the emergency department by ambulance from [facility] for sudden onset of nausea. Patient describes vague GI upset which was similar to symptoms he had 2 weeks ago when he was admitted for elevated troponins, presumed NSTEMI. Catheterization at that time showed mild nonobstructive coronary artery disease. Echo showed severely reduced LV systolic dysfunction with EF 25-30% and wall motion abnormalities suggestive of stress-induced versus ischemic cardiomyopathy.. He was discharged on Lipitor, Coreg and lisinopril which he states he has been compliant.   Patient denies fevers/chills, weakness, dizziness,  chest pain, shortness of breath, vomiting, constipation or diarrhea  abdominal pain, dysuria/frequency, changes in mental status.    VITAL SIGNS:  Blood pressure (!) 138/51, pulse (!) 57, temperature (!) 97.5 F (36.4 C), temperature source Oral, resp. rate 17, height 5\' 11"  (1.803 m), weight 108 kg (238 lb), SpO2 96 %.  I/O:   Intake/Output Summary (Last 24 hours) at 07/25/16 1317 Last data filed at 07/25/16 1028  Gross  per 24 hour  Intake              243 ml  Output              850 ml  Net             -607 ml    PHYSICAL EXAMINATION:   GENERAL:  83 y.o.-year-old patient lying in the bed with no acute distress.  EYES: Pupils equal, round, reactive to light and accommodation. No scleral icterus. Extraocular muscles intact.  HEENT: Head atraumatic, normocephalic. Oropharynx and nasopharynx clear.  NECK:  Supple, no jugular venous distention. No thyroid enlargement, no tenderness.  LUNGS: Normal breath sounds bilaterally, no wheezing, rales,rhonchi or crepitation. No use of accessory muscles of respiration. Decreased bibasilar breath sounds noted CARDIOVASCULAR: S1, S2 normal. No rubs, or gallops. 2/6 systolic murmur present ABDOMEN: Soft, nontender, nondistended. Bowel sounds present. No organomegaly or mass.  EXTREMITIES: No pedal  edema, cyanosis, or clubbing.  NEUROLOGIC: Cranial nerves II through XII are intact. Muscle strength 5/5 in all extremities. Sensation intact. Gait not checked. Global weakness noted PSYCHIATRIC: The patient is alert and oriented x 2-3. Sometimes repeating the same sentence SKIN: No obvious rash, lesion, or ulcer.   DATA REVIEW:   CBC  Recent Labs Lab 07/22/16 0531  WBC 6.4  HGB 11.9*  HCT 34.8*  PLT 178    Chemistries   Recent Labs Lab 07/20/16 1859  07/24/16 0703  NA 137  < > 139  K 4.2  < > 4.1  CL 102  < > 106  CO2 26  < > 27  GLUCOSE 223*  < > 168*  BUN 20  < > 23*  CREATININE 1.07  < > 1.31*  CALCIUM 9.1  < > 8.5*  AST 21  --   --   ALT 25  --   --   ALKPHOS 68  --   --   BILITOT 0.5  --   --   < > = values in this interval not displayed.  Cardiac Enzymes  Recent Labs Lab 07/21/16 0658  TROPONINI 0.04*    Microbiology Results  Results for orders placed or performed during the hospital encounter of 07/27/15  Urine culture     Status: Abnormal   Collection Time: 07/27/15  6:32 PM  Result Value Ref Range Status   Specimen Description URINE, CLEAN CATCH  Final   Special Requests NONE  Final   Culture >=100,000 COLONIES/mL ESCHERICHIA COLI (A)  Final   Report Status 07/30/2015 FINAL  Final   Organism ID, Bacteria ESCHERICHIA COLI (A)  Final      Susceptibility   Escherichia coli - MIC*    AMPICILLIN 4 SENSITIVE Sensitive     CEFAZOLIN <=4 SENSITIVE Sensitive     CEFTRIAXONE <=1 SENSITIVE Sensitive     CIPROFLOXACIN <=0.25 SENSITIVE Sensitive     GENTAMICIN <=1 SENSITIVE Sensitive     IMIPENEM <=0.25 SENSITIVE Sensitive     NITROFURANTOIN <=16 SENSITIVE Sensitive     TRIMETH/SULFA <=20 SENSITIVE Sensitive     AMPICILLIN/SULBACTAM 4 SENSITIVE Sensitive     PIP/TAZO <=4 SENSITIVE Sensitive     * >=100,000 COLONIES/mL ESCHERICHIA COLI    RADIOLOGY:  No results found.  EKG:   Orders placed or performed during the hospital encounter of 07/20/16   . ED EKG  . ED EKG  . EKG 12-Lead  . EKG 12-Lead  . Repeat EKG  . Repeat EKG  . EKG 12-Lead (at  6am)  . EKG 12-Lead (Repeat cardiac markers, recurrent chest pain)  . EKG 12-Lead (at 6am)  . EKG 12-Lead (Repeat cardiac markers, recurrent chest pain)      Management plans discussed with the patient, family and they are in agreement.  CODE STATUS:     Code Status Orders        Start     Ordered   07/21/16 0050  Full code  Continuous     07/21/16 0049    Code Status History    Date Active Date Inactive Code Status Order ID Comments User Context   07/10/2016  9:34 AM 07/13/2016  5:19 PM Full Code [id number]  Saundra Shelling, MD Inpatient   06/19/2015 12:21 AM 06/23/2015 12:47 AM Full Code [id number]  Alesia Richards, MD ED   06/04/2015  8:13 PM 06/08/2015  6:33 PM Full Code [id number]  Loletha Grayer, MD ED      TOTAL TIME TAKING CARE OF THIS PATIENT: 35 minutes.    Vaughan Basta M.D on 07/25/2016 at 1:17 PM  Between 7am to 6pm - Pager - [phone]  After 6pm go to [email or web address] - password [email or web address] [facility]  Office  [phone]  CC: Primary care physician; Coral Spikes, DO   Note: This dictation was prepared with Dragon dictation along with smaller phrase technology. Any transcriptional errors that result from this process are unintentional.

## 2016-07-25 NOTE — Progress Notes (Signed)
EMS called for non emergent transport to [facility].

## 2016-07-25 NOTE — Progress Notes (Signed)
Called report to Claudette Head, LPN at [facility].

## 2016-07-25 NOTE — Clinical Social Work Note (Signed)
Patient to be d/c'ed today to [facility].  Patient and family agreeable to plans will transport via ems RN to call report [place], MSW, [place]

## 2016-07-25 NOTE — Plan of Care (Signed)
Problem: Safety: Goal: Ability to remain free from injury will improve Outcome: Not Progressing Patient is impulsive and ignores instructions to call for assistance.

## 2016-07-25 NOTE — Progress Notes (Signed)
Physical Therapy Treatment Patient Details Name: Chase Simmons MRN: [id number] DOB: [birth year] Today's Date: 07/25/2016    History of Present Illness Pt. is an 83 y.o. male who was admitted from [facility] for Nausea secondary to gallbladder disease. Pt. had a recent hospitalization for symiliar issues. Pt. PMHx includes: Bladder CA, A-Fib, COPD, essential Tremor, Urinary incontinence.    PT Comments    Patient requiring increased assist for bed mobility, consistent min assist for transfers and gait with RW this date.  Increased sway in A/P plane with all standing activities requiring assist from therapist to prevent LOB.  Patient mildly impulsive with limited insight into deficits and need for assist. Demonstrates decreased cadence/gait speed (10' walk time, 11-12 seconds), decreased LE power, all indicative of increased fall risk with functional activities.  Continue to recommend RW and +1 assist for all functional activities at this time.   Follow Up Recommendations  SNF     Equipment Recommendations       Recommendations for Other Services       Precautions / Restrictions Precautions Precautions: Fall Restrictions Weight Bearing Restrictions: No    Mobility  Bed Mobility Overal bed mobility: Needs Assistance Bed Mobility: Supine to Sit Rolling: Min assist         General bed mobility comments: heavy use of momentum for truncal elevation  Transfers Overall transfer level: Needs assistance Equipment used: Rolling walker (2 wheeled) Transfers: Sit to/from Stand Sit to Stand: Min guard;Min assist         General transfer comment: requires UE support to complete functional transfers; broad BOS, decreased LE strength/power  Ambulation/Gait Ambulation/Gait assistance: Min assist Ambulation Distance (Feet):  (>120) Assistive device: Rolling walker (2 wheeled)   Gait velocity: 10' walk time, 11-12 seconds, indicative of increased fall risk   General Gait  Details: broad BOS with forward flexed posture, flat foot contact; limited ability to respond to external perturbations.  Poor bilat hip/ankle balance strategies, relying primarily on LE step strategy for recovery   Stairs            Wheelchair Mobility    Modified Rankin (Stroke Patients Only)       Balance Overall balance assessment: Needs assistance Sitting-balance support: No upper extremity supported;Feet supported Sitting balance-Leahy Scale: Good     Standing balance support: Bilateral upper extremity supported;During functional activity Standing balance-Leahy Scale: Poor                              Cognition Arousal/Alertness: Awake/alert Behavior During Therapy: WFL for tasks assessed/performed Overall Cognitive Status: Within Functional Limits for tasks assessed                                        Exercises Other Exercises Other Exercises: Upper and lower body dressing, min assist; sit/stand with RW, min assist; standing balance duing functional activity, min assist.  Increased sway in A/P plane, min assist  to prevent posterior LOB    General Comments        Pertinent Vitals/Pain Pain Assessment: Faces Faces Pain Scale: Hurts little more Pain Location: Chronic L knee pain Pain Descriptors / Indicators: Aching Pain Intervention(s): Limited activity within patient's tolerance;Monitored during session;Repositioned    Home Living  Prior Function            PT Goals (current goals can now be found in the care plan section) Acute Rehab PT Goals Patient Stated Goal: to get better PT Goal Formulation: With patient Time For Goal Achievement: 08/05/16 Potential to Achieve Goals: Good Progress towards PT goals: Progressing toward goals    Frequency    Min 2X/week      PT Plan Current plan remains appropriate    Co-evaluation              AM-PAC PT "6 Clicks" Daily Activity   Outcome Measure  Difficulty turning over in bed (including adjusting bedclothes, sheets and blankets)?: Total Difficulty moving from lying on back to sitting on the side of the bed? : Total Difficulty sitting down on and standing up from a chair with arms (e.g., wheelchair, bedside commode, etc,.)?: Total Help needed moving to and from a bed to chair (including a wheelchair)?: A Little Help needed walking in hospital room?: A Little Help needed climbing 3-5 steps with a railing? : A Lot 6 Click Score: 11    End of Session Equipment Utilized During Treatment: Gait belt Activity Tolerance: Patient tolerated treatment well Patient left: in chair;with call bell/phone within reach;with chair alarm set   PT Visit Diagnosis: Unsteadiness on feet (R26.81);Muscle weakness (generalized) (M62.81);Other abnormalities of gait and mobility (R26.89);History of falling (Z91.81)     Time: 0370-9643 PT Time Calculation (min) (ACUTE ONLY): 20 min  Charges:  $Gait Training: 8-22 mins                    G Codes:       Manveer Gomes H. Owens Shark, PT, DPT, NCS 07/25/16, 11:54 AM [phone]

## 2016-07-25 NOTE — Progress Notes (Signed)
CCMD called pt had 2.08 second pause, page out to Dr. Anselm Jungling

## 2016-07-25 NOTE — Progress Notes (Signed)
EMS here for transport to [facility]

## 2016-07-25 NOTE — Care Management (Signed)
Barrier - [employer] fast streamer for [employer].  Does not appear patient was treated by rehab therapies over the weekend

## 2016-07-25 NOTE — Plan of Care (Signed)
Problem: Safety: Goal: Ability to remain free from injury will improve Outcome: Progressing Fall precautions in place, non skid socks when oob  Problem: Tissue Perfusion: Goal: Risk factors for ineffective tissue perfusion will decrease Outcome: Progressing SQ Heparin  Problem: Cardiac: Goal: Ability to achieve and maintain adequate cardiovascular perfusion will improve Outcome: Progressing No further chest pain

## 2016-07-25 NOTE — Progress Notes (Signed)
CCMD called pt had 2.12 second pause, Dr. Anselm Jungling aware.

## 2016-07-26 DIAGNOSIS — E119 Type 2 diabetes mellitus without complications: Secondary | ICD-10-CM | POA: Diagnosis not present

## 2016-07-26 DIAGNOSIS — I5032 Chronic diastolic (congestive) heart failure: Secondary | ICD-10-CM | POA: Diagnosis not present

## 2016-07-26 DIAGNOSIS — R42 Dizziness and giddiness: Secondary | ICD-10-CM | POA: Diagnosis not present

## 2016-07-27 ENCOUNTER — Telehealth: Payer: Self-pay | Admitting: Family Medicine

## 2016-07-27 NOTE — Telephone Encounter (Signed)
Pt daughter called back returning your call. Please advise, thank you!  Call [name] @ [phone]

## 2016-07-27 NOTE — Telephone Encounter (Signed)
Transition Care Management Follow-up Telephone Call  How have you been since you were released from the hospital? Patient is being released from [facility] today , he is walking better but is being released due to insurance will not pay.   Do you understand why you were in the hospital? Yes   Do you understand the discharge instrcutions? Yes  Items Reviewed:  Medications reviewed:Yes  Allergies reviewed: Yes  Dietary changes reviewed: Yes  Referrals reviewed: Yes   Functional Questionnaire:   Activities of Daily Living (ADLs):   He states they are independent in the following:Needs assistance with bathing , clothing and dressing. States they require assistance with the following:Needs assistance with transferring from bed to chair, dressing bathing eating meals.   Any transportation issues/concerns?: yes   Any patient concerns? Yes, concerned for patient having trouble with dizziness when rising and moving from lying to sitting.   Confirmed importance and date/time of follow-up visits scheduled: Yes   Confirmed with patient if condition begins to worsen call PCP or go to the ER.  Patient was given the Call-a-Nurse line [phone]: yes,

## 2016-07-27 NOTE — Telephone Encounter (Signed)
First attempt made for TCM call made left message for patient daughter to return call to office.

## 2016-08-01 ENCOUNTER — Ambulatory Visit (INDEPENDENT_AMBULATORY_CARE_PROVIDER_SITE_OTHER): Payer: Medicare HMO | Admitting: Family Medicine

## 2016-08-01 ENCOUNTER — Encounter: Payer: Self-pay | Admitting: Family Medicine

## 2016-08-01 DIAGNOSIS — E119 Type 2 diabetes mellitus without complications: Secondary | ICD-10-CM | POA: Diagnosis not present

## 2016-08-01 DIAGNOSIS — G40909 Epilepsy, unspecified, not intractable, without status epilepticus: Secondary | ICD-10-CM | POA: Diagnosis not present

## 2016-08-01 DIAGNOSIS — I5020 Unspecified systolic (congestive) heart failure: Secondary | ICD-10-CM | POA: Diagnosis not present

## 2016-08-01 DIAGNOSIS — M25562 Pain in left knee: Secondary | ICD-10-CM | POA: Diagnosis not present

## 2016-08-01 DIAGNOSIS — M6281 Muscle weakness (generalized): Secondary | ICD-10-CM | POA: Diagnosis not present

## 2016-08-01 DIAGNOSIS — R110 Nausea: Secondary | ICD-10-CM

## 2016-08-01 DIAGNOSIS — M25511 Pain in right shoulder: Secondary | ICD-10-CM | POA: Diagnosis not present

## 2016-08-01 DIAGNOSIS — I110 Hypertensive heart disease with heart failure: Secondary | ICD-10-CM | POA: Diagnosis not present

## 2016-08-01 DIAGNOSIS — G250 Essential tremor: Secondary | ICD-10-CM | POA: Diagnosis not present

## 2016-08-01 DIAGNOSIS — I5022 Chronic systolic (congestive) heart failure: Secondary | ICD-10-CM | POA: Insufficient documentation

## 2016-08-01 DIAGNOSIS — I480 Paroxysmal atrial fibrillation: Secondary | ICD-10-CM | POA: Diagnosis not present

## 2016-08-01 DIAGNOSIS — J449 Chronic obstructive pulmonary disease, unspecified: Secondary | ICD-10-CM | POA: Diagnosis not present

## 2016-08-01 DIAGNOSIS — I509 Heart failure, unspecified: Secondary | ICD-10-CM | POA: Insufficient documentation

## 2016-08-01 DIAGNOSIS — I2510 Atherosclerotic heart disease of native coronary artery without angina pectoris: Secondary | ICD-10-CM | POA: Diagnosis not present

## 2016-08-01 DIAGNOSIS — K8020 Calculus of gallbladder without cholecystitis without obstruction: Secondary | ICD-10-CM | POA: Diagnosis not present

## 2016-08-01 MED ORDER — BLOOD GLUCOSE MONITOR KIT: PACK | 0 refills | Status: DC

## 2016-08-01 NOTE — Assessment & Plan Note (Signed)
Most recent admission for nausea. Known gallstones. Improved with protonix, reglan, zofran. Medications reviewed and reconciled today. Nausea now resolved. Doing well. Patient to continue current medications. Follow up in 3 months.

## 2016-08-01 NOTE — Assessment & Plan Note (Signed)
New problem. Discovered during earlier admission in July. Has follow up Monday with cardiology.

## 2016-08-01 NOTE — Progress Notes (Signed)
Subjective:  Patient ID: Chase Simmons, male    DOB: [birth year]  Age: 83 y.o. MRN: [id number]  CC: Hospital follow up  HPI:  83 year old male with CAD, hypertension, paroxysmal A. fib, DM 2, essential tremor, COPD, hyperlipidemia, anxiety presents for hospital follow-up.  Patient was admitted earlier in July. Presented with nausea and vomiting and found to have elevated troponin. Catheterization revealed mild obstructive coronary disease. Found to have congestive heart failure with an EF of 25-30%. Was deemed to be stress-induced cardiomyopathy. Patient was placed on carvedilol given CHF despite bradycardia. Nausea and vomiting improved with Zofran. Patient discharged to nursing facility.  Patient admitted again from 7/18-7/23. Hospital course reviewed and summarized as follows: Patient presented with nausea. Patient subsequently admitted. Treated with Zofran, Reglan, and Protonix. Did well during hospitalization and was discharged to [facility]. Lisinopril increased during admission.  Patient presents today for follow-up. He states he is doing well. No chest pain, nausea, vomiting. He feels that his symptoms were precipitated by fatty foods. He does have known cholelithiasis. He's had prior gallstone pancreatitis. No abdominal pain. No recent fevers or chills. He states that he is doing well.  Social Hx   Social History   Social History  . Marital status: Legally Separated    Spouse name: N/A  . Number of children: 3  . Years of education: 12   Occupational History  . Retired Administrator    Social History Main Topics  . Smoking status: Former Smoker    Packs/day: 1.50    Years: 40.00  . Smokeless tobacco: Never Used  . Alcohol use 7.2 oz/week    12 Standard drinks or equivalent per week     Comment: 1 drink with dinner  . Drug use: No  . Sexual activity: No   Other Topics Concern  . None   Social History Narrative   Patient grew up in [place], [place]. He is separated  from his wife since 23. They never divorced. Patient has 1 son and 2 daughters. 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    Review of Systems  Respiratory: Negative.   Cardiovascular: Negative.   Gastrointestinal: Negative.    Objective:  BP 140/64 (BP Location: Left Arm, Patient Position: Sitting, Cuff Size: Normal)   Pulse (!) 50   Temp 97.6 F (36.4 C) (Oral)   Wt 232 lb 8 oz (105.5 kg)   SpO2 97%   BMI 32.43 kg/m   BP/Weight 08/01/2016 07/25/2016 0/10/2723  Systolic BP 366 440 -  Diastolic BP 64 51 -  Wt. (Lbs) 232.5 - 238  BMI 32.43 - 33.19    Physical Exam  Constitutional:  Elderly male sitting in wheelchair, no acute distress.  Cardiovascular:  Bradycardia. Regular rhythm. No murmur.  Pulmonary/Chest: Effort normal. He has no wheezes. He has no rales.  Abdominal: Soft. He exhibits no distension. There is no tenderness. There is no rebound and no guarding.  Neurological: He is alert.  Psychiatric: He has a normal mood and affect.  Vitals reviewed.   Lab Results  Component Value Date   WBC 6.4 07/22/2016   HGB 11.9 (L) 07/22/2016   HCT 34.8 (L) 07/22/2016   PLT 178 07/22/2016   GLUCOSE 168 (H) 07/24/2016   CHOL 105 07/11/2016   TRIG 54 07/11/2016   HDL 41 07/11/2016   LDLDIRECT 69.0 04/01/2014   LDLCALC 53 07/11/2016   ALT 25 07/20/2016   AST 21 07/20/2016   NA 139 07/24/2016  K 4.1 07/24/2016   CL 106 07/24/2016   CREATININE 1.31 (H) 07/24/2016   BUN 23 (H) 07/24/2016   CO2 27 07/24/2016   INR 1.05 07/10/2016   HGBA1C 6.8 (H) 07/10/2016   MICROALBUR 2.1 (H) 03/15/2016    Assessment & Plan:   Problem List Items Addressed This Visit      Cardiovascular and Mediastinum   CHF (congestive heart failure) ([place])    New problem. Discovered during earlier admission in July. Has follow up Monday with cardiology.        Other   Nausea    Most recent admission for nausea. Known gallstones. Improved with protonix, reglan,  zofran. Medications reviewed and reconciled today. Nausea now resolved. Doing well. Patient to continue current medications. Follow up in 3 months.          Meds ordered this encounter  Medications  . metFORMIN (GLUCOPHAGE) 1000 MG tablet    Sig: Take 1,000 mg by mouth 2 (two) times daily with a meal.  . blood glucose meter kit and supplies KIT    Sig: Dispense based on patient and insurance preference. Use up to four times daily as directed.    Dispense:  1 each    Refill:  0    Order Specific Question:   Number of strips    Answer:   100    Order Specific Question:   Number of lancets    Answer:   100     Follow-up: 3 months  Kim[place] DO [facility]

## 2016-08-01 NOTE — Patient Instructions (Addendum)
Continue current meds.  Follow up in 3 months.  Take care  Dr. Lacinda Axon

## 2016-08-03 DIAGNOSIS — I2510 Atherosclerotic heart disease of native coronary artery without angina pectoris: Secondary | ICD-10-CM | POA: Diagnosis not present

## 2016-08-03 DIAGNOSIS — G250 Essential tremor: Secondary | ICD-10-CM | POA: Diagnosis not present

## 2016-08-03 DIAGNOSIS — E119 Type 2 diabetes mellitus without complications: Secondary | ICD-10-CM | POA: Diagnosis not present

## 2016-08-03 DIAGNOSIS — I5020 Unspecified systolic (congestive) heart failure: Secondary | ICD-10-CM | POA: Diagnosis not present

## 2016-08-03 DIAGNOSIS — I110 Hypertensive heart disease with heart failure: Secondary | ICD-10-CM | POA: Diagnosis not present

## 2016-08-03 DIAGNOSIS — I480 Paroxysmal atrial fibrillation: Secondary | ICD-10-CM | POA: Diagnosis not present

## 2016-08-03 DIAGNOSIS — J449 Chronic obstructive pulmonary disease, unspecified: Secondary | ICD-10-CM | POA: Diagnosis not present

## 2016-08-03 DIAGNOSIS — M6281 Muscle weakness (generalized): Secondary | ICD-10-CM | POA: Diagnosis not present

## 2016-08-03 DIAGNOSIS — G40909 Epilepsy, unspecified, not intractable, without status epilepticus: Secondary | ICD-10-CM | POA: Diagnosis not present

## 2016-08-03 DIAGNOSIS — K8020 Calculus of gallbladder without cholecystitis without obstruction: Secondary | ICD-10-CM | POA: Diagnosis not present

## 2016-08-08 ENCOUNTER — Ambulatory Visit: Payer: Medicare HMO | Admitting: Cardiovascular Disease

## 2016-08-08 ENCOUNTER — Encounter: Payer: Self-pay | Admitting: Family Medicine

## 2016-08-09 DIAGNOSIS — I110 Hypertensive heart disease with heart failure: Secondary | ICD-10-CM | POA: Diagnosis not present

## 2016-08-09 DIAGNOSIS — J449 Chronic obstructive pulmonary disease, unspecified: Secondary | ICD-10-CM | POA: Diagnosis not present

## 2016-08-09 DIAGNOSIS — G40909 Epilepsy, unspecified, not intractable, without status epilepticus: Secondary | ICD-10-CM | POA: Diagnosis not present

## 2016-08-09 DIAGNOSIS — I5020 Unspecified systolic (congestive) heart failure: Secondary | ICD-10-CM | POA: Diagnosis not present

## 2016-08-09 DIAGNOSIS — I2510 Atherosclerotic heart disease of native coronary artery without angina pectoris: Secondary | ICD-10-CM | POA: Diagnosis not present

## 2016-08-09 DIAGNOSIS — I480 Paroxysmal atrial fibrillation: Secondary | ICD-10-CM | POA: Diagnosis not present

## 2016-08-09 DIAGNOSIS — K8020 Calculus of gallbladder without cholecystitis without obstruction: Secondary | ICD-10-CM | POA: Diagnosis not present

## 2016-08-09 DIAGNOSIS — G250 Essential tremor: Secondary | ICD-10-CM | POA: Diagnosis not present

## 2016-08-09 DIAGNOSIS — M6281 Muscle weakness (generalized): Secondary | ICD-10-CM | POA: Diagnosis not present

## 2016-08-09 DIAGNOSIS — E119 Type 2 diabetes mellitus without complications: Secondary | ICD-10-CM | POA: Diagnosis not present

## 2016-08-10 DIAGNOSIS — G40909 Epilepsy, unspecified, not intractable, without status epilepticus: Secondary | ICD-10-CM | POA: Diagnosis not present

## 2016-08-10 DIAGNOSIS — I5020 Unspecified systolic (congestive) heart failure: Secondary | ICD-10-CM | POA: Diagnosis not present

## 2016-08-10 DIAGNOSIS — J449 Chronic obstructive pulmonary disease, unspecified: Secondary | ICD-10-CM | POA: Diagnosis not present

## 2016-08-10 DIAGNOSIS — M6281 Muscle weakness (generalized): Secondary | ICD-10-CM | POA: Diagnosis not present

## 2016-08-10 DIAGNOSIS — G250 Essential tremor: Secondary | ICD-10-CM | POA: Diagnosis not present

## 2016-08-10 DIAGNOSIS — I480 Paroxysmal atrial fibrillation: Secondary | ICD-10-CM | POA: Diagnosis not present

## 2016-08-10 DIAGNOSIS — I110 Hypertensive heart disease with heart failure: Secondary | ICD-10-CM | POA: Diagnosis not present

## 2016-08-10 DIAGNOSIS — E119 Type 2 diabetes mellitus without complications: Secondary | ICD-10-CM | POA: Diagnosis not present

## 2016-08-10 DIAGNOSIS — I2510 Atherosclerotic heart disease of native coronary artery without angina pectoris: Secondary | ICD-10-CM | POA: Diagnosis not present

## 2016-08-10 DIAGNOSIS — K8020 Calculus of gallbladder without cholecystitis without obstruction: Secondary | ICD-10-CM | POA: Diagnosis not present

## 2016-08-11 DIAGNOSIS — K8020 Calculus of gallbladder without cholecystitis without obstruction: Secondary | ICD-10-CM | POA: Diagnosis not present

## 2016-08-11 DIAGNOSIS — M6281 Muscle weakness (generalized): Secondary | ICD-10-CM | POA: Diagnosis not present

## 2016-08-11 DIAGNOSIS — E119 Type 2 diabetes mellitus without complications: Secondary | ICD-10-CM | POA: Diagnosis not present

## 2016-08-11 DIAGNOSIS — J449 Chronic obstructive pulmonary disease, unspecified: Secondary | ICD-10-CM | POA: Diagnosis not present

## 2016-08-11 DIAGNOSIS — I480 Paroxysmal atrial fibrillation: Secondary | ICD-10-CM | POA: Diagnosis not present

## 2016-08-11 DIAGNOSIS — I5020 Unspecified systolic (congestive) heart failure: Secondary | ICD-10-CM | POA: Diagnosis not present

## 2016-08-11 DIAGNOSIS — G40909 Epilepsy, unspecified, not intractable, without status epilepticus: Secondary | ICD-10-CM | POA: Diagnosis not present

## 2016-08-11 DIAGNOSIS — I2510 Atherosclerotic heart disease of native coronary artery without angina pectoris: Secondary | ICD-10-CM | POA: Diagnosis not present

## 2016-08-11 DIAGNOSIS — G250 Essential tremor: Secondary | ICD-10-CM | POA: Diagnosis not present

## 2016-08-11 DIAGNOSIS — I110 Hypertensive heart disease with heart failure: Secondary | ICD-10-CM | POA: Diagnosis not present

## 2016-08-12 DIAGNOSIS — I2510 Atherosclerotic heart disease of native coronary artery without angina pectoris: Secondary | ICD-10-CM | POA: Diagnosis not present

## 2016-08-12 DIAGNOSIS — G250 Essential tremor: Secondary | ICD-10-CM | POA: Diagnosis not present

## 2016-08-12 DIAGNOSIS — M6281 Muscle weakness (generalized): Secondary | ICD-10-CM | POA: Diagnosis not present

## 2016-08-12 DIAGNOSIS — E119 Type 2 diabetes mellitus without complications: Secondary | ICD-10-CM | POA: Diagnosis not present

## 2016-08-12 DIAGNOSIS — I480 Paroxysmal atrial fibrillation: Secondary | ICD-10-CM | POA: Diagnosis not present

## 2016-08-12 DIAGNOSIS — J449 Chronic obstructive pulmonary disease, unspecified: Secondary | ICD-10-CM | POA: Diagnosis not present

## 2016-08-12 DIAGNOSIS — I110 Hypertensive heart disease with heart failure: Secondary | ICD-10-CM | POA: Diagnosis not present

## 2016-08-12 DIAGNOSIS — G40909 Epilepsy, unspecified, not intractable, without status epilepticus: Secondary | ICD-10-CM | POA: Diagnosis not present

## 2016-08-12 DIAGNOSIS — I5020 Unspecified systolic (congestive) heart failure: Secondary | ICD-10-CM | POA: Diagnosis not present

## 2016-08-12 DIAGNOSIS — K8020 Calculus of gallbladder without cholecystitis without obstruction: Secondary | ICD-10-CM | POA: Diagnosis not present

## 2016-08-15 DIAGNOSIS — G250 Essential tremor: Secondary | ICD-10-CM | POA: Diagnosis not present

## 2016-08-15 DIAGNOSIS — I2510 Atherosclerotic heart disease of native coronary artery without angina pectoris: Secondary | ICD-10-CM | POA: Diagnosis not present

## 2016-08-15 DIAGNOSIS — E119 Type 2 diabetes mellitus without complications: Secondary | ICD-10-CM | POA: Diagnosis not present

## 2016-08-15 DIAGNOSIS — J449 Chronic obstructive pulmonary disease, unspecified: Secondary | ICD-10-CM | POA: Diagnosis not present

## 2016-08-15 DIAGNOSIS — I5020 Unspecified systolic (congestive) heart failure: Secondary | ICD-10-CM | POA: Diagnosis not present

## 2016-08-15 DIAGNOSIS — M6281 Muscle weakness (generalized): Secondary | ICD-10-CM | POA: Diagnosis not present

## 2016-08-15 DIAGNOSIS — G40909 Epilepsy, unspecified, not intractable, without status epilepticus: Secondary | ICD-10-CM | POA: Diagnosis not present

## 2016-08-15 DIAGNOSIS — I480 Paroxysmal atrial fibrillation: Secondary | ICD-10-CM | POA: Diagnosis not present

## 2016-08-15 DIAGNOSIS — I110 Hypertensive heart disease with heart failure: Secondary | ICD-10-CM | POA: Diagnosis not present

## 2016-08-15 DIAGNOSIS — K8020 Calculus of gallbladder without cholecystitis without obstruction: Secondary | ICD-10-CM | POA: Diagnosis not present

## 2016-08-17 DIAGNOSIS — G250 Essential tremor: Secondary | ICD-10-CM | POA: Diagnosis not present

## 2016-08-17 DIAGNOSIS — I480 Paroxysmal atrial fibrillation: Secondary | ICD-10-CM | POA: Diagnosis not present

## 2016-08-17 DIAGNOSIS — M6281 Muscle weakness (generalized): Secondary | ICD-10-CM | POA: Diagnosis not present

## 2016-08-17 DIAGNOSIS — G40909 Epilepsy, unspecified, not intractable, without status epilepticus: Secondary | ICD-10-CM | POA: Diagnosis not present

## 2016-08-17 DIAGNOSIS — E119 Type 2 diabetes mellitus without complications: Secondary | ICD-10-CM | POA: Diagnosis not present

## 2016-08-17 DIAGNOSIS — K8020 Calculus of gallbladder without cholecystitis without obstruction: Secondary | ICD-10-CM | POA: Diagnosis not present

## 2016-08-17 DIAGNOSIS — I2510 Atherosclerotic heart disease of native coronary artery without angina pectoris: Secondary | ICD-10-CM | POA: Diagnosis not present

## 2016-08-17 DIAGNOSIS — I110 Hypertensive heart disease with heart failure: Secondary | ICD-10-CM | POA: Diagnosis not present

## 2016-08-17 DIAGNOSIS — I5020 Unspecified systolic (congestive) heart failure: Secondary | ICD-10-CM | POA: Diagnosis not present

## 2016-08-17 DIAGNOSIS — J449 Chronic obstructive pulmonary disease, unspecified: Secondary | ICD-10-CM | POA: Diagnosis not present

## 2016-08-18 DIAGNOSIS — K8020 Calculus of gallbladder without cholecystitis without obstruction: Secondary | ICD-10-CM | POA: Diagnosis not present

## 2016-08-18 DIAGNOSIS — I5020 Unspecified systolic (congestive) heart failure: Secondary | ICD-10-CM | POA: Diagnosis not present

## 2016-08-18 DIAGNOSIS — M6281 Muscle weakness (generalized): Secondary | ICD-10-CM | POA: Diagnosis not present

## 2016-08-18 DIAGNOSIS — G40909 Epilepsy, unspecified, not intractable, without status epilepticus: Secondary | ICD-10-CM | POA: Diagnosis not present

## 2016-08-18 DIAGNOSIS — J449 Chronic obstructive pulmonary disease, unspecified: Secondary | ICD-10-CM | POA: Diagnosis not present

## 2016-08-18 DIAGNOSIS — I480 Paroxysmal atrial fibrillation: Secondary | ICD-10-CM | POA: Diagnosis not present

## 2016-08-18 DIAGNOSIS — E119 Type 2 diabetes mellitus without complications: Secondary | ICD-10-CM | POA: Diagnosis not present

## 2016-08-18 DIAGNOSIS — G250 Essential tremor: Secondary | ICD-10-CM | POA: Diagnosis not present

## 2016-08-18 DIAGNOSIS — I110 Hypertensive heart disease with heart failure: Secondary | ICD-10-CM | POA: Diagnosis not present

## 2016-08-18 DIAGNOSIS — I2510 Atherosclerotic heart disease of native coronary artery without angina pectoris: Secondary | ICD-10-CM | POA: Diagnosis not present

## 2016-08-19 DIAGNOSIS — I110 Hypertensive heart disease with heart failure: Secondary | ICD-10-CM | POA: Diagnosis not present

## 2016-08-19 DIAGNOSIS — I5020 Unspecified systolic (congestive) heart failure: Secondary | ICD-10-CM | POA: Diagnosis not present

## 2016-08-19 DIAGNOSIS — I2510 Atherosclerotic heart disease of native coronary artery without angina pectoris: Secondary | ICD-10-CM | POA: Diagnosis not present

## 2016-08-19 DIAGNOSIS — G250 Essential tremor: Secondary | ICD-10-CM | POA: Diagnosis not present

## 2016-08-19 DIAGNOSIS — I480 Paroxysmal atrial fibrillation: Secondary | ICD-10-CM | POA: Diagnosis not present

## 2016-08-19 DIAGNOSIS — M6281 Muscle weakness (generalized): Secondary | ICD-10-CM | POA: Diagnosis not present

## 2016-08-19 DIAGNOSIS — K8020 Calculus of gallbladder without cholecystitis without obstruction: Secondary | ICD-10-CM | POA: Diagnosis not present

## 2016-08-19 DIAGNOSIS — E119 Type 2 diabetes mellitus without complications: Secondary | ICD-10-CM | POA: Diagnosis not present

## 2016-08-19 DIAGNOSIS — G40909 Epilepsy, unspecified, not intractable, without status epilepticus: Secondary | ICD-10-CM | POA: Diagnosis not present

## 2016-08-19 DIAGNOSIS — J449 Chronic obstructive pulmonary disease, unspecified: Secondary | ICD-10-CM | POA: Diagnosis not present

## 2016-08-22 DIAGNOSIS — I480 Paroxysmal atrial fibrillation: Secondary | ICD-10-CM | POA: Diagnosis not present

## 2016-08-22 DIAGNOSIS — R079 Chest pain, unspecified: Secondary | ICD-10-CM | POA: Diagnosis not present

## 2016-08-22 DIAGNOSIS — I110 Hypertensive heart disease with heart failure: Secondary | ICD-10-CM | POA: Diagnosis not present

## 2016-08-22 DIAGNOSIS — E119 Type 2 diabetes mellitus without complications: Secondary | ICD-10-CM | POA: Diagnosis not present

## 2016-08-22 DIAGNOSIS — I5020 Unspecified systolic (congestive) heart failure: Secondary | ICD-10-CM | POA: Diagnosis not present

## 2016-08-22 DIAGNOSIS — M6281 Muscle weakness (generalized): Secondary | ICD-10-CM | POA: Diagnosis not present

## 2016-08-22 DIAGNOSIS — G250 Essential tremor: Secondary | ICD-10-CM | POA: Diagnosis not present

## 2016-08-22 DIAGNOSIS — I2510 Atherosclerotic heart disease of native coronary artery without angina pectoris: Secondary | ICD-10-CM | POA: Diagnosis not present

## 2016-08-22 DIAGNOSIS — K8020 Calculus of gallbladder without cholecystitis without obstruction: Secondary | ICD-10-CM | POA: Diagnosis not present

## 2016-08-22 DIAGNOSIS — J449 Chronic obstructive pulmonary disease, unspecified: Secondary | ICD-10-CM | POA: Diagnosis not present

## 2016-08-22 DIAGNOSIS — G40909 Epilepsy, unspecified, not intractable, without status epilepticus: Secondary | ICD-10-CM | POA: Diagnosis not present

## 2016-08-23 ENCOUNTER — Emergency Department: Payer: Medicare HMO

## 2016-08-23 ENCOUNTER — Encounter: Payer: Self-pay | Admitting: Emergency Medicine

## 2016-08-23 ENCOUNTER — Emergency Department
Admission: EM | Admit: 2016-08-23 | Discharge: 2016-08-23 | Disposition: A | Discharge status: Home / Self Care | Payer: Medicare HMO | Attending: Emergency Medicine | Admitting: Emergency Medicine

## 2016-08-23 DIAGNOSIS — J449 Chronic obstructive pulmonary disease, unspecified: Secondary | ICD-10-CM | POA: Diagnosis not present

## 2016-08-23 DIAGNOSIS — Z7409 Other reduced mobility: Secondary | ICD-10-CM | POA: Diagnosis not present

## 2016-08-23 DIAGNOSIS — W19XXXA Unspecified fall, initial encounter: Secondary | ICD-10-CM | POA: Diagnosis not present

## 2016-08-23 DIAGNOSIS — R6889 Other general symptoms and signs: Secondary | ICD-10-CM | POA: Diagnosis not present

## 2016-08-23 DIAGNOSIS — E119 Type 2 diabetes mellitus without complications: Secondary | ICD-10-CM | POA: Diagnosis not present

## 2016-08-23 DIAGNOSIS — I10 Essential (primary) hypertension: Secondary | ICD-10-CM | POA: Diagnosis not present

## 2016-08-23 DIAGNOSIS — Z87891 Personal history of nicotine dependence: Secondary | ICD-10-CM | POA: Insufficient documentation

## 2016-08-23 DIAGNOSIS — Z8551 Personal history of malignant neoplasm of bladder: Secondary | ICD-10-CM | POA: Diagnosis not present

## 2016-08-23 DIAGNOSIS — Z91040 Latex allergy status: Secondary | ICD-10-CM | POA: Insufficient documentation

## 2016-08-23 DIAGNOSIS — I5042 Chronic combined systolic (congestive) and diastolic (congestive) heart failure: Secondary | ICD-10-CM | POA: Insufficient documentation

## 2016-08-23 DIAGNOSIS — R42 Dizziness and giddiness: Secondary | ICD-10-CM | POA: Insufficient documentation

## 2016-08-23 LAB — COMPREHENSIVE METABOLIC PANEL
ALT: 24 U/L (ref 17–63)
AST: 26 U/L (ref 15–41)
Albumin: 3.9 g/dL (ref 3.5–5.0)
Alkaline Phosphatase: 75 U/L (ref 38–126)
Anion gap: 12 (ref 5–15)
BUN: 30 mg/dL — ABNORMAL HIGH (ref 6–20)
CHLORIDE: 105 mmol/L (ref 101–111)
CO2: 23 mmol/L (ref 22–32)
CREATININE: 1.24 mg/dL (ref 0.61–1.24)
Calcium: 9.6 mg/dL (ref 8.9–10.3)
GFR calc Af Amer: >60 mL/min (ref >60)
GFR, EST NON AFRICAN AMERICAN: 53 mL/min — AB (ref >60)
Glucose, Bld: 138 mg/dL — ABNORMAL HIGH (ref 65–99)
POTASSIUM: 3.7 mmol/L (ref 3.5–5.1)
Sodium: 140 mmol/L (ref 135–145)
TOTAL PROTEIN: 7.4 g/dL (ref 6.5–8.1)
Total Bilirubin: 0.7 mg/dL (ref 0.3–1.2)

## 2016-08-23 LAB — CBC WITH DIFFERENTIAL/PLATELET
Basophils Absolute: 0.1 10*3/uL (ref 0–0.1)
Basophils Relative: 1 %
EOS PCT: 4 %
Eosinophils Absolute: 0.3 10*3/uL (ref 0–0.7)
HEMATOCRIT: 38.1 % — AB (ref 40.0–52.0)
Hemoglobin: 13.3 g/dL (ref 13.0–18.0)
LYMPHS ABS: 2.3 10*3/uL (ref 1.0–3.6)
LYMPHS PCT: 27 %
MCH: 35.3 pg — AB (ref 26.0–34.0)
MCHC: 34.7 g/dL (ref 32.0–36.0)
MCV: 101.6 fL — AB (ref 80.0–100.0)
MONO ABS: 0.6 10*3/uL (ref 0.2–1.0)
MONOS PCT: 7 %
Neutro Abs: 5.4 10*3/uL (ref 1.4–6.5)
Neutrophils Relative %: 61 %
PLATELETS: 168 10*3/uL (ref 150–440)
RBC: 3.75 MIL/uL — ABNORMAL LOW (ref 4.40–5.90)
RDW: 13.5 % (ref 11.5–14.5)
WBC: 8.7 10*3/uL (ref 3.8–10.6)

## 2016-08-23 LAB — URINALYSIS, COMPLETE (UACMP) WITH MICROSCOPIC
Bacteria, UA: NONE SEEN
Bilirubin Urine: NEGATIVE
GLUCOSE, UA: NEGATIVE mg/dL
Ketones, ur: 5 mg/dL — AB
LEUKOCYTES UA: NEGATIVE
Nitrite: NEGATIVE
PH: 5 (ref 5.0–8.0)
PROTEIN: NEGATIVE mg/dL
Specific Gravity, Urine: 1.011 (ref 1.005–1.030)

## 2016-08-23 LAB — TROPONIN I

## 2016-08-23 MED ORDER — MECLIZINE HCL 25 MG PO TABS
50.0000 mg | ORAL_TABLET | Freq: Once | ORAL | Status: AC
  Administered 2016-08-23: 50 mg via ORAL
  Filled 2016-08-23: qty 2

## 2016-08-23 MED ORDER — SODIUM CHLORIDE 0.9 % IV SOLN
Freq: Once | INTRAVENOUS | Status: AC
  Administered 2016-08-23: 09:00:00 via INTRAVENOUS

## 2016-08-23 NOTE — ED Notes (Signed)
Pt able to stand and take some steps with walker. Mostly uses wheelchair at home. Daughter called with pt permission and updated.  Pt feels safe to go home

## 2016-08-23 NOTE — ED Notes (Signed)
Patient transported to CT 

## 2016-08-23 NOTE — ED Notes (Signed)
In and out cath done by this RN and jill RN. Sterile technique maintained.

## 2016-08-23 NOTE — ED Notes (Signed)
Attempted to call daughter X 2 with no answer.

## 2016-08-23 NOTE — ED Triage Notes (Signed)
Pt from home. Started this morning with vertigo per pt, hx of same. Has medication for vertigo. No pain. Hx afib. [place] because of dizziness but denies pain from fall.

## 2016-08-23 NOTE — ED Notes (Signed)
Diaper changed. Pt given remote. No needs at this time

## 2016-08-23 NOTE — ED Notes (Signed)
EMS here to take pt back home.  Pt now reports he does not have a key and EMS locked his door when they brought him.

## 2016-08-23 NOTE — ED Provider Notes (Signed)
[facility] Emergency Department Provider Note       Time seen: ----------------------------------------- 8:16 AM on 08/23/2016 -----------------------------------------     I have reviewed the triage vital signs and the nursing notes.   HISTORY   Chief Complaint No chief complaint on file.    HPI Chase Simmons is a 83 y.o. male who presents to the ED for dizziness and weakness. Patient comes from home where he lives by himself. He has reportedly had very little intake yet today. Patient states he had a "swallow of Pepsi" this morning. Yesterday he states he was feeling okay. Patient states he is not sure what he is right now. He denies fevers, chills, chest pain, shortness of breath, vomiting or diarrhea. Patient states his medications have not changed recently.   Past Medical History:  Diagnosis Date  . Allergy   . Atrial fibrillation ([place]) 08/30/2013  . Cancer [facility]) pt unsure of year   bladder cancer-Dr. Jacqlyn Larsen  . COPD (chronic obstructive pulmonary disease) ([place])   . Coronary atherosclerosis of native coronary artery 01/05/2016  . Hyperlipidemia   . Hypertension   . Tremor, essential    sees [facility] neurologist  . Type 2 diabetes mellitus with complication, without long-term current use of insulin ([place]) 08/01/2012  . Urinary incontinence     Patient Active Problem List   Diagnosis Date Noted  . Nausea 08/01/2016  . CHF (congestive heart failure) ([place]) 08/01/2016  . Atypical chest pain 07/20/2016  . Persistent atrial fibrillation ([place]) 07/10/2016  . COPD (chronic obstructive pulmonary disease) ([place]) 03/15/2016  . Coronary atherosclerosis of native coronary artery 01/05/2016  . Aortic atherosclerosis ([place]) 01/05/2016  . Bradycardia 01/05/2016  . Anxiety 06/17/2015  . Malignant neoplasm of urinary bladder ([place]) 06/17/2015  . Allergic rhinitis 05/14/2015  . PAF (paroxysmal atrial fibrillation) ([place]) 08/30/2013  . Acid reflux 08/30/2013  .  Tremor, essential 10/22/2012  . Hyperlipidemia 10/22/2012  . HTN (hypertension) 08/01/2012  . Type 2 diabetes mellitus with complication, without long-term current use of insulin ([place]) 08/01/2012  . Benign prostatic hyperplasia with urinary obstruction 09/02/2011    Past Surgical History:  Procedure Laterality Date  . bladder cancer    . CATARACT EXTRACTION    . HERNIA REPAIR    . LEFT HEART CATH AND CORONARY ANGIOGRAPHY N/A 07/11/2016   Procedure: Left Heart Cath and Coronary Angiography;  Surgeon: Wellington Hampshire, MD;  Location: [facility];  Service: Cardiovascular;  Laterality: N/A;    Allergies Latex  Social History Social History  Substance Use Topics  . Smoking status: Former Smoker    Packs/day: 1.50    Years: 40.00  . Smokeless tobacco: Never Used  . Alcohol use 7.2 oz/week    12 Standard drinks or equivalent per week     Comment: 1 drink with dinner    Review of Systems Constitutional: Negative for fever. Eyes: Negative for vision changes ENT:  Negative for congestion, sore throat Cardiovascular: Negative for chest pain. Respiratory: Negative for shortness of breath. Gastrointestinal: Negative for abdominal pain, vomiting and diarrhea. Genitourinary: Negative for dysuria. Musculoskeletal: Negative for back pain. Skin: Negative for rash. Neurological: Positive for weakness and dizziness  All systems negative/normal/unremarkable except as stated in the HPI  ____________________________________________   PHYSICAL EXAM:  VITAL SIGNS: ED Triage Vitals  Enc Vitals Group     BP      Pulse      Resp      Temp  Temp src      SpO2      Weight      Height      Head Circumference      Peak Flow      Pain Score      Pain Loc      Pain Edu?      Excl. in [place]?     Constitutional: Alert and oriented. Mild distress Eyes: Conjunctivae are normal. Normal extraocular movements. ENT   Head: Normocephalic and atraumatic.   Nose: No  congestion/rhinnorhea.   Mouth/Throat: Mucous membranes are moist.   Neck: No stridor. Cardiovascular: Normal rate, regular rhythm. No murmurs, rubs, or gallops. Respiratory: Normal respiratory effort without tachypnea nor retractions. Breath sounds are clear and equal bilaterally. No wheezes/rales/rhonchi. Gastrointestinal: Soft and nontender. Normal bowel sounds Musculoskeletal: Nontender with normal range of motion in extremities. Very mild lower extremity edema Neurologic:  Normal speech and language. No gross focal neurologic deficits are appreciated. Generalized weakness, tremors noted Skin:  Skin is warm, dry and intact. No rash noted. Psychiatric: Mood and affect are normal. Speech and behavior are normal.  ____________________________________________  EKG: Interpreted by me. A 2 fibrillation with a rate of 60 bpm, wide QRS, left bundle branch block, normal QT.  ____________________________________________  ED COURSE:  Pertinent labs & imaging results that were available during my care of the patient were reviewed by me and considered in my medical decision making (see chart for details). Patient presents for weakness, we will assess with labs and imaging as indicated. Clinical Course as of Aug 23 1005  Tue Aug 23, 2016  1006 Patient states he feels fine currently and is comfortable in his home environment. Patient states he only had dizziness when he tried to get up out of bed which caused him to fall. Currently he is without complaint.  [JW]    Clinical Course User Index [JW] Earleen Newport, MD   Procedures ____________________________________________   LABS (pertinent positives/negatives)  Labs Reviewed  CBC WITH DIFFERENTIAL/PLATELET - Abnormal; Notable for the following:       Result Value   RBC 3.75 (*)    HCT 38.1 (*)    MCV 101.6 (*)    MCH 35.3 (*)    All other components within normal limits  COMPREHENSIVE METABOLIC PANEL - Abnormal; Notable for  the following:    Glucose, Bld 138 (*)    BUN 30 (*)    GFR calc non Af Amer 53 (*)    All other components within normal limits  URINALYSIS, COMPLETE (UACMP) WITH MICROSCOPIC - Abnormal; Notable for the following:    Color, Urine YELLOW (*)    APPearance HAZY (*)    Hgb urine dipstick MODERATE (*)    Ketones, ur 5 (*)    Squamous Epithelial / LPF 0-5 (*)    All other components within normal limits  TROPONIN I    RADIOLOGY Images were viewed by me  CT head IMPRESSION: 1. Stable exam. No acute intracranial abnormality. 2. Atrophy with chronic small vessel white matter ischemic disease. ____________________________________________  FINAL ASSESSMENT AND PLAN  Vertigo  Plan: Patient's labs and imaging were dictated above. Patient had presented for symptoms of vertigo which caused him to fall at home. His workup has been negative and he is able to ambulate here. He is stable for outpatient follow-up and again feels comfortable going home.   Earleen Newport, MD   Note: This note was generated in part or whole with voice  recognition software. Voice recognition is usually quite accurate but there are transcription errors that can and very often do occur. I apologize for any typographical errors that were not detected and corrected.     Earleen Newport, MD 08/23/16 1007

## 2016-08-24 ENCOUNTER — Observation Stay
Admission: EM | Admit: 2016-08-24 | Discharge: 2016-08-26 | Disposition: A | Discharge status: Home / Self Care | Payer: Medicare HMO | Attending: Internal Medicine | Admitting: Internal Medicine

## 2016-08-24 DIAGNOSIS — I110 Hypertensive heart disease with heart failure: Secondary | ICD-10-CM | POA: Diagnosis not present

## 2016-08-24 DIAGNOSIS — I509 Heart failure, unspecified: Secondary | ICD-10-CM | POA: Insufficient documentation

## 2016-08-24 DIAGNOSIS — I2510 Atherosclerotic heart disease of native coronary artery without angina pectoris: Secondary | ICD-10-CM | POA: Insufficient documentation

## 2016-08-24 DIAGNOSIS — R42 Dizziness and giddiness: Secondary | ICD-10-CM | POA: Diagnosis not present

## 2016-08-24 DIAGNOSIS — F329 Major depressive disorder, single episode, unspecified: Secondary | ICD-10-CM | POA: Diagnosis not present

## 2016-08-24 DIAGNOSIS — M6281 Muscle weakness (generalized): Secondary | ICD-10-CM | POA: Diagnosis not present

## 2016-08-24 DIAGNOSIS — G40909 Epilepsy, unspecified, not intractable, without status epilepticus: Secondary | ICD-10-CM | POA: Diagnosis not present

## 2016-08-24 DIAGNOSIS — W19XXXA Unspecified fall, initial encounter: Secondary | ICD-10-CM | POA: Insufficient documentation

## 2016-08-24 DIAGNOSIS — Z91040 Latex allergy status: Secondary | ICD-10-CM | POA: Diagnosis not present

## 2016-08-24 DIAGNOSIS — Z8551 Personal history of malignant neoplasm of bladder: Secondary | ICD-10-CM | POA: Insufficient documentation

## 2016-08-24 DIAGNOSIS — I480 Paroxysmal atrial fibrillation: Secondary | ICD-10-CM | POA: Insufficient documentation

## 2016-08-24 DIAGNOSIS — K8020 Calculus of gallbladder without cholecystitis without obstruction: Secondary | ICD-10-CM | POA: Diagnosis not present

## 2016-08-24 DIAGNOSIS — Z8042 Family history of malignant neoplasm of prostate: Secondary | ICD-10-CM | POA: Insufficient documentation

## 2016-08-24 DIAGNOSIS — Z794 Long term (current) use of insulin: Secondary | ICD-10-CM | POA: Insufficient documentation

## 2016-08-24 DIAGNOSIS — J449 Chronic obstructive pulmonary disease, unspecified: Secondary | ICD-10-CM | POA: Insufficient documentation

## 2016-08-24 DIAGNOSIS — Z9849 Cataract extraction status, unspecified eye: Secondary | ICD-10-CM | POA: Diagnosis not present

## 2016-08-24 DIAGNOSIS — E118 Type 2 diabetes mellitus with unspecified complications: Secondary | ICD-10-CM | POA: Insufficient documentation

## 2016-08-24 DIAGNOSIS — F419 Anxiety disorder, unspecified: Secondary | ICD-10-CM | POA: Diagnosis not present

## 2016-08-24 DIAGNOSIS — E785 Hyperlipidemia, unspecified: Secondary | ICD-10-CM | POA: Insufficient documentation

## 2016-08-24 DIAGNOSIS — E119 Type 2 diabetes mellitus without complications: Secondary | ICD-10-CM | POA: Diagnosis not present

## 2016-08-24 DIAGNOSIS — Z7982 Long term (current) use of aspirin: Secondary | ICD-10-CM | POA: Insufficient documentation

## 2016-08-24 DIAGNOSIS — I481 Persistent atrial fibrillation: Secondary | ICD-10-CM | POA: Diagnosis not present

## 2016-08-24 DIAGNOSIS — R69 Illness, unspecified: Secondary | ICD-10-CM | POA: Diagnosis not present

## 2016-08-24 DIAGNOSIS — Z803 Family history of malignant neoplasm of breast: Secondary | ICD-10-CM | POA: Insufficient documentation

## 2016-08-24 DIAGNOSIS — I10 Essential (primary) hypertension: Secondary | ICD-10-CM | POA: Diagnosis not present

## 2016-08-24 DIAGNOSIS — N400 Enlarged prostate without lower urinary tract symptoms: Secondary | ICD-10-CM | POA: Insufficient documentation

## 2016-08-24 DIAGNOSIS — G250 Essential tremor: Secondary | ICD-10-CM | POA: Diagnosis not present

## 2016-08-24 DIAGNOSIS — Z820 Family history of epilepsy and other diseases of the nervous system: Secondary | ICD-10-CM | POA: Diagnosis not present

## 2016-08-24 DIAGNOSIS — Z79899 Other long term (current) drug therapy: Secondary | ICD-10-CM | POA: Insufficient documentation

## 2016-08-24 DIAGNOSIS — Z87891 Personal history of nicotine dependence: Secondary | ICD-10-CM | POA: Insufficient documentation

## 2016-08-24 DIAGNOSIS — Z8261 Family history of arthritis: Secondary | ICD-10-CM | POA: Insufficient documentation

## 2016-08-24 DIAGNOSIS — I700 Atherosclerosis of aorta: Secondary | ICD-10-CM | POA: Diagnosis not present

## 2016-08-24 DIAGNOSIS — I5020 Unspecified systolic (congestive) heart failure: Secondary | ICD-10-CM | POA: Diagnosis not present

## 2016-08-24 DIAGNOSIS — J309 Allergic rhinitis, unspecified: Secondary | ICD-10-CM | POA: Diagnosis not present

## 2016-08-24 DIAGNOSIS — K219 Gastro-esophageal reflux disease without esophagitis: Secondary | ICD-10-CM | POA: Insufficient documentation

## 2016-08-24 NOTE — ED Triage Notes (Signed)
Pt presents to ED via [facility] from home with c/o dizziness. Pt was seen here yesterday for same and directed to follow-up with Thersa Salt, DO, but pt states he did not make an appointment. Pt states that he lives alone and was "afraid that I was gonna die alone in my bed". Pt denies any new s/x's or changes to the c/o's from yesterday. Pt reports (+) nausea, but denies emesis. Pt is alert, in NAD; RR even, regular, and unlabored. Skin color/temp is WNL.

## 2016-08-24 NOTE — ED Notes (Signed)
First Nurse note.Marland KitchenMarland KitchenEMS pt to triage via wheelchair. EMS reports called out for dizziness. EMS reports pt's family said pt had been seen last night in the ED and Dx'd with vertigo. Pt was assisted to take his nighttime medications  and to the bathroom by EMS and then the daughter reported no one would be able to stay with the patient tonight and they wanted him taken to the hospital because he is afraid to be alone. Family also told EMS they would need ambulance transportation home for him if he is discharged.

## 2016-08-24 NOTE — ED Notes (Signed)
Spoke with Dr Karma Greaser regarding pt's presenting s/x's and c/o's. No labs or imaging to be carried out at this time.

## 2016-08-25 ENCOUNTER — Emergency Department: Payer: Medicare HMO

## 2016-08-25 DIAGNOSIS — R42 Dizziness and giddiness: Secondary | ICD-10-CM

## 2016-08-25 DIAGNOSIS — I2510 Atherosclerotic heart disease of native coronary artery without angina pectoris: Secondary | ICD-10-CM | POA: Diagnosis not present

## 2016-08-25 DIAGNOSIS — I4891 Unspecified atrial fibrillation: Secondary | ICD-10-CM | POA: Diagnosis not present

## 2016-08-25 DIAGNOSIS — I10 Essential (primary) hypertension: Secondary | ICD-10-CM | POA: Diagnosis not present

## 2016-08-25 LAB — BASIC METABOLIC PANEL
ANION GAP: 9 (ref 5–15)
BUN: 29 mg/dL — ABNORMAL HIGH (ref 6–20)
CO2: 25 mmol/L (ref 22–32)
Calcium: 9.2 mg/dL (ref 8.9–10.3)
Chloride: 105 mmol/L (ref 101–111)
Creatinine, Ser: 1.13 mg/dL (ref 0.61–1.24)
GFR calc Af Amer: >60 mL/min (ref >60)
GFR, EST NON AFRICAN AMERICAN: 59 mL/min — AB (ref >60)
Glucose, Bld: 209 mg/dL — ABNORMAL HIGH (ref 65–99)
POTASSIUM: 4.3 mmol/L (ref 3.5–5.1)
SODIUM: 139 mmol/L (ref 135–145)

## 2016-08-25 LAB — TSH: TSH: 1.631 u[IU]/mL (ref 0.350–4.500)

## 2016-08-25 LAB — CBC
HEMATOCRIT: 38.3 % — AB (ref 40.0–52.0)
HEMOGLOBIN: 13.2 g/dL (ref 13.0–18.0)
MCH: 35.2 pg — ABNORMAL HIGH (ref 26.0–34.0)
MCHC: 34.4 g/dL (ref 32.0–36.0)
MCV: 102.3 fL — ABNORMAL HIGH (ref 80.0–100.0)
PLATELETS: 162 10*3/uL (ref 150–440)
RBC: 3.74 MIL/uL — ABNORMAL LOW (ref 4.40–5.90)
RDW: 13.4 % (ref 11.5–14.5)
WBC: 8.1 10*3/uL (ref 3.8–10.6)

## 2016-08-25 LAB — TROPONIN I: Troponin I: <0.03 ng/mL (ref <0.03)

## 2016-08-25 LAB — GLUCOSE, CAPILLARY
Glucose-Capillary: 110 mg/dL — ABNORMAL HIGH (ref 65–99)
Glucose-Capillary: 169 mg/dL — ABNORMAL HIGH (ref 65–99)
Glucose-Capillary: 113 mg/dL — ABNORMAL HIGH (ref 65–99)
Glucose-Capillary: 170 mg/dL — ABNORMAL HIGH (ref 65–99)

## 2016-08-25 MED ORDER — IPRATROPIUM-ALBUTEROL 0.5-2.5 (3) MG/3ML IN SOLN: 3.0000 mL | Freq: Four times a day (QID) | RESPIRATORY_TRACT | Status: DC | PRN

## 2016-08-25 MED ORDER — GI COCKTAIL ~~LOC~~
30.0000 mL | Freq: Four times a day (QID) | ORAL | Status: DC | PRN
Start: 2016-08-25 — End: 2016-08-26
  Filled 2016-08-25: qty 30

## 2016-08-25 MED ORDER — ACETAMINOPHEN 650 MG RE SUPP
650.0000 mg | Freq: Four times a day (QID) | RECTAL | Status: DC | PRN
Start: 2016-08-25 — End: 2016-08-26

## 2016-08-25 MED ORDER — INSULIN ASPART 100 UNIT/ML ~~LOC~~ SOLN
0.0000 [IU] | Freq: Three times a day (TID) | SUBCUTANEOUS | Status: DC
  Administered 2016-08-25 – 2016-08-26 (×2): 2 [IU] via SUBCUTANEOUS
  Administered 2016-08-26: 09:00:00 1 [IU] via SUBCUTANEOUS
  Filled 2016-08-25 (×3): qty 1

## 2016-08-25 MED ORDER — ASPIRIN EC 81 MG PO TBEC
81.0000 mg | DELAYED_RELEASE_TABLET | Freq: Every day | ORAL | Status: DC
  Administered 2016-08-25 – 2016-08-26 (×2): 81 mg via ORAL
  Filled 2016-08-25 (×2): qty 1

## 2016-08-25 MED ORDER — TIOTROPIUM BROMIDE MONOHYDRATE 18 MCG IN CAPS
18.0000 ug | ORAL_CAPSULE | Freq: Every day | RESPIRATORY_TRACT | Status: DC
  Administered 2016-08-25 – 2016-08-26 (×2): 18 ug via RESPIRATORY_TRACT
  Filled 2016-08-25: qty 5

## 2016-08-25 MED ORDER — PAROXETINE HCL 20 MG PO TABS
40.0000 mg | ORAL_TABLET | Freq: Every day | ORAL | Status: DC
  Administered 2016-08-25 – 2016-08-26 (×2): 40 mg via ORAL
  Filled 2016-08-25 (×2): qty 2

## 2016-08-25 MED ORDER — ENOXAPARIN SODIUM 40 MG/0.4ML ~~LOC~~ SOLN
40.0000 mg | SUBCUTANEOUS | Status: DC
  Administered 2016-08-25: 40 mg via SUBCUTANEOUS
  Filled 2016-08-25: qty 0.4

## 2016-08-25 MED ORDER — AMLODIPINE BESYLATE 5 MG PO TABS
5.0000 mg | ORAL_TABLET | Freq: Every day | ORAL | Status: DC
  Administered 2016-08-26: 5 mg via ORAL
  Filled 2016-08-25: qty 1

## 2016-08-25 MED ORDER — ALPRAZOLAM 0.5 MG PO TABS: 0.2500 mg | ORAL_TABLET | Freq: Two times a day (BID) | ORAL | Status: DC | PRN

## 2016-08-25 MED ORDER — PANTOPRAZOLE SODIUM 40 MG PO TBEC
40.0000 mg | DELAYED_RELEASE_TABLET | Freq: Every day | ORAL | Status: DC
  Administered 2016-08-25 – 2016-08-26 (×2): 40 mg via ORAL
  Filled 2016-08-25 (×2): qty 1

## 2016-08-25 MED ORDER — LORATADINE 10 MG PO TABS
10.0000 mg | ORAL_TABLET | Freq: Every day | ORAL | Status: DC
  Administered 2016-08-25 – 2016-08-26 (×2): 10 mg via ORAL
  Filled 2016-08-25 (×2): qty 1

## 2016-08-25 MED ORDER — MECLIZINE HCL 12.5 MG PO TABS
12.5000 mg | ORAL_TABLET | Freq: Three times a day (TID) | ORAL | Status: DC | PRN
  Administered 2016-08-26: 12.5 mg via ORAL
  Filled 2016-08-25 (×2): qty 1

## 2016-08-25 MED ORDER — LISINOPRIL 20 MG PO TABS
20.0000 mg | ORAL_TABLET | Freq: Every day | ORAL | Status: DC
  Administered 2016-08-25 – 2016-08-26 (×2): 20 mg via ORAL
  Filled 2016-08-25 (×2): qty 1

## 2016-08-25 MED ORDER — ATORVASTATIN CALCIUM 20 MG PO TABS
80.0000 mg | ORAL_TABLET | Freq: Every day | ORAL | Status: DC
  Administered 2016-08-25 – 2016-08-26 (×2): 80 mg via ORAL
  Filled 2016-08-25 (×2): qty 4

## 2016-08-25 MED ORDER — LORAZEPAM 2 MG/ML IJ SOLN
2.0000 mg | INTRAMUSCULAR | Status: AC
  Administered 2016-08-26: 13:00:00 2 mg via INTRAVENOUS
  Filled 2016-08-25: qty 1

## 2016-08-25 MED ORDER — AMLODIPINE BESYLATE 10 MG PO TABS
10.0000 mg | ORAL_TABLET | Freq: Every day | ORAL | Status: DC
  Administered 2016-08-25: 09:00:00 10 mg via ORAL
  Filled 2016-08-25: qty 1

## 2016-08-25 MED ORDER — ONDANSETRON HCL 4 MG/2ML IJ SOLN: 4.0000 mg | Freq: Four times a day (QID) | INTRAMUSCULAR | Status: DC | PRN

## 2016-08-25 MED ORDER — ONDANSETRON HCL 4 MG PO TABS
4.0000 mg | ORAL_TABLET | Freq: Four times a day (QID) | ORAL | Status: DC | PRN
Start: 2016-08-25 — End: 2016-08-26

## 2016-08-25 MED ORDER — DOCUSATE SODIUM 100 MG PO CAPS
100.0000 mg | ORAL_CAPSULE | Freq: Two times a day (BID) | ORAL | Status: DC
  Administered 2016-08-25 – 2016-08-26 (×3): 100 mg via ORAL
  Filled 2016-08-25 (×3): qty 1

## 2016-08-25 MED ORDER — INSULIN ASPART 100 UNIT/ML ~~LOC~~ SOLN: 0.0000 [IU] | Freq: Every day | SUBCUTANEOUS | Status: DC

## 2016-08-25 MED ORDER — PRIMIDONE 50 MG PO TABS
75.0000 mg | ORAL_TABLET | Freq: Two times a day (BID) | ORAL | Status: DC
  Administered 2016-08-25 – 2016-08-26 (×3): 75 mg via ORAL
  Filled 2016-08-25 (×5): qty 1.5

## 2016-08-25 MED ORDER — ACETAMINOPHEN 325 MG PO TABS
650.0000 mg | ORAL_TABLET | Freq: Four times a day (QID) | ORAL | Status: DC | PRN
  Administered 2016-08-25: 650 mg via ORAL
  Filled 2016-08-25: qty 2

## 2016-08-25 MED ORDER — SODIUM CHLORIDE 0.9 % IV SOLN
INTRAVENOUS | Status: DC
  Administered 2016-08-25: 04:00:00 via INTRAVENOUS

## 2016-08-25 MED ORDER — METFORMIN HCL 500 MG PO TABS
1000.0000 mg | ORAL_TABLET | Freq: Two times a day (BID) | ORAL | Status: DC
  Filled 2016-08-25: qty 2

## 2016-08-25 NOTE — Plan of Care (Signed)
Problem: Education: Goal: Knowledge of [facility] Education information/materials will improve Outcome: Progressing Pt likes to be called Chase Simmons  ast Medical History:  Diagnosis Date  . Allergy   . Atrial fibrillation ([place]) 08/30/2013  . Cancer [facility]) pt unsure of year   bladder cancer-Dr. Jacqlyn Larsen  . COPD (chronic obstructive pulmonary disease) ([place])   . Coronary atherosclerosis of native coronary artery 01/05/2016  . Hyperlipidemia   . Hypertension   . Tremor, essential    sees [facility] neurologist  . Type 2 diabetes mellitus with complication, without long-term current use of insulin ([place]) 08/01/2012  . Urinary incontinence    Pt is well controlled with home medications

## 2016-08-25 NOTE — ED Notes (Signed)
Patient transported to MRI 

## 2016-08-25 NOTE — ED Notes (Signed)
Pt. Asked for urinally to add in going to bathroom.  Pt. Given urinally and added in placement.  Pt. Stated "I will call when I am finished with it".

## 2016-08-25 NOTE — Care Management Note (Signed)
Case Management Note  Patient Details  Name: Chase Simmons MRN: [id number] Date of Birth: 01/05/1933  Subjective/Objective:      Admitted to [facility] under observation status with the diagnosis of dizziness. Lives alone in an apartment. Daughter is [name] [phone]). Last seen Dr. Lacinda Axon 08/01/16. Prescriptions are filled at [facility] on [address]  Home Health in the home now, can't remember name of agency. [place] (07/25/16) and [facility] 2-3 times in the past. No home oxygen.   Rolling walker, cane, and wheelchair in the home. Personal Care service helps with baths and dressing. Self feeds. No driving. Family helps with errands. [place] prior to this admission. Good appetite.              Action/Plan: Unsure of discharge needs at this time.   Expected Discharge Date:  08/26/16               Expected Discharge Plan:     In-House Referral:     Discharge planning Services     Post Acute Care Choice:    Choice offered to:     DME Arranged:    DME Agency:     HH Arranged:    HH Agency:     Status of Service:     If discussed at [employer], dates discussed:    Additional Comments:  Shelbie Ammons, [place] Management [phone] 08/25/2016, 11:03 AM

## 2016-08-25 NOTE — Clinical Social Work Note (Signed)
CSW consulted for discharge planning needs. Pt was admitted under OBS and has a managed medicare that requires a prior authorization for STR. PT eval is pending. Full assessment to follow, if appropriate. CSW will continue to follow.   Darden Dates, MSW, LCSW  Clinical Social Worker  [phone]

## 2016-08-25 NOTE — ED Notes (Signed)
Pt back from MRI and reporting he was unable to complete MRI due to "shape of his back" pt verbalized his head was flat and he could not have stayed in that position for the entire scan. While in MRI pt was able to stand and pivot with a two person stand by assist and denied dizziness the first time standing but had to pivot again and verbalized dizziness the second time standing.

## 2016-08-25 NOTE — Progress Notes (Signed)
[facility] [place] NAME: Chase Simmons    MR#:  [id number]  DATE OF BIRTH:  1933-06-17  SUBJECTIVE:   Came in with dizziness. Patient said he had a fall was not able to describe how it happened. Denies any frequent falls. Denies any dizziness today. REVIEW OF SYSTEMS:   Review of Systems  Constitutional: Negative for chills, fever and weight loss.  HENT: Negative for ear discharge, ear pain and nosebleeds.   Eyes: Negative for blurred vision, pain and discharge.  Respiratory: Negative for sputum production, shortness of breath, wheezing and stridor.   Cardiovascular: Negative for chest pain, palpitations, orthopnea and PND.  Gastrointestinal: Negative for abdominal pain, diarrhea, nausea and vomiting.  Genitourinary: Negative for frequency and urgency.  Musculoskeletal: Negative for back pain and joint pain.  Neurological: Positive for dizziness and weakness. Negative for sensory change, speech change and focal weakness.  Psychiatric/Behavioral: Negative for depression and hallucinations. The patient is not nervous/anxious.    Tolerating Diet:yes Tolerating PT: pending  DRUG ALLERGIES:   Allergies  Allergen Reactions  . Latex Itching    VITALS:  Blood pressure 134/87, pulse 69, temperature 98.3 F (36.8 C), temperature source Oral, resp. rate 16, height 5\' 11"  (1.803 m), weight 111.8 kg (246 lb 6.4 oz), SpO2 95 %.  PHYSICAL EXAMINATION:   Physical Exam  GENERAL:  83 y.o.-year-old patient lying in the bed with no acute distress. disheveled EYES: Pupils equal, round, reactive to light and accommodation. No scleral icterus. Extraocular muscles intact.  HEENT: Head atraumatic, normocephalic. Oropharynx and nasopharynx clear.  NECK:  Supple, no jugular venous distention. No thyroid enlargement, no tenderness.  LUNGS: Normal breath sounds bilaterally, no wheezing, rales, rhonchi. No use of accessory muscles of respiration.   CARDIOVASCULAR: S1, S2 normal. No murmurs, rubs, or gallops.  ABDOMEN: Soft, nontender, nondistended. Bowel sounds present. No organomegaly or mass.  EXTREMITIES: No cyanosis, clubbing or edema b/l.    NEUROLOGIC: Cranial nerves II through XII are intact. No focal Motor or sensory deficits b/l.   PSYCHIATRIC:  patient is alert and orineted.  SKIN: No obvious rash, lesion, or ulcer.   LABORATORY PANEL:  CBC  Recent Labs Lab 08/24/16 2351  WBC 8.1  HGB 13.2  HCT 38.3*  PLT 162    Chemistries   Recent Labs Lab 08/23/16 0819 08/24/16 2351  NA 140 139  K 3.7 4.3  CL 105 105  CO2 23 25  GLUCOSE 138* 209*  BUN 30* 29*  CREATININE 1.24 1.13  CALCIUM 9.6 9.2  AST 26  --   ALT 24  --   ALKPHOS 75  --   BILITOT 0.7  --    Cardiac Enzymes  Recent Labs Lab 08/24/16 2351  TROPONINI <0.03   RADIOLOGY:  No results found. ASSESSMENT AND PLAN:  Chase Simmons is a 83 y.o. male return to the emergency department with acute onset of dizziness tonight. Patient states that he's had multiple episodes of dizziness over the past few months however it has worsened over the past week. Patient was recently seen on 08/23/2016 for the same with a negative CT scan performed at that time  1. Dizziness: The patient is not the best historian thus it is difficult to characterize sensation as vertigo versus dizziness. Nonetheless the patient will likely need some sedation for MRI to completely rule out stroke. He has no motor or sensory deficits at this time. Continue to monitor. -When necessary meclizine -We'll try  to get MRI after giving IV sedation with 2 mg of Ativan--- if patient unable to lay flat will discontinue MRI orders -No neuro deficits noted -Get physical therapy to see patient -Cannot get any clear history from patient appears to be a poor historian  2. Atrial fibrillation: Paroxysmal; the patient has not experienced palpitations. He is not on antiplatelet or anticoagulation  therapy likely due to fall risk.  3. Hypertension: Acceptable for age; fluctuates. Continue amlodipine and lisinopril  4. CAD: Stable; continue aspirin.  5. COPD: Stable; continue Spiriva. Albuterol as needed.  6. Diabetes mellitus type 2: Hold oral agents. Sliding scale insulin while hospitalized.  7. Hyperlipidemia: Continue statin therapy  8. Tremor: Continue Prinivil  9. Depression: Continue paroxetine  10. DVT prophylaxis: Lovenox  Case discussed with Care Management/Social Worker. Management plans discussed with the patient and they are in agreement.  CODE STATUS: Full  DVT Prophylaxis: lovenox  TOTAL TIME TAKING CARE OF THIS PATIENT: *30* minutes.  >50% time spent on counselling and coordination of care  POSSIBLE D/C IN *1-2* DAYS, DEPENDING ON CLINICAL CONDITION.  Note: This dictation was prepared with Dragon dictation along with smaller phrase technology. Any transcriptional errors that result from this process are unintentional.  Minyon Billiter M.D on 08/25/2016 at 2:01 PM  Between 7am to 6pm - Pager - [phone]  After 6pm go to [email or web address] - password [email or web address] [facility]  Office  [phone]  CC: Primary care physician; Coral Spikes, DO

## 2016-08-25 NOTE — ED Provider Notes (Signed)
 [facility] Emergency Department Provider Note   First MD Initiated Contact with Patient 08/24/16 2327     (approximate)  I have reviewed the triage vital signs and the nursing notes.   HISTORY  Chief Complaint Dizziness   HPI Chase Simmons is a 83 y.o. male return to the emergency department with acute onset of dizziness tonight. Patient states that he's had multiple episodes of dizziness over the past few months however it has worsened over the past week. Patient was recently seen on 08/23/2016 for the same with a negative CT scan performed at that time. Patient states he took "a medicine for dizziness before he came in which has improved his symptoms.  Past Medical History:  Diagnosis Date  . Allergy   . Atrial fibrillation (HCC) 08/30/2013  . Cancer (HCC) pt unsure of year   bladder cancer-Dr. Cope  . COPD (chronic obstructive pulmonary disease) (HCC)   . Coronary atherosclerosis of native coronary artery 01/05/2016  . Hyperlipidemia   . Hypertension   . Tremor, essential    sees [facility] neurologist  . Type 2 diabetes mellitus with complication, without long-term current use of insulin (HCC) 08/01/2012  . Urinary incontinence     Patient Active Problem List   Diagnosis Date Noted  . Nausea 08/01/2016  . CHF (congestive heart failure) (HCC) 08/01/2016  . Atypical chest pain 07/20/2016  . Persistent atrial fibrillation (HCC) 07/10/2016  . COPD (chronic obstructive pulmonary disease) (HCC) 03/15/2016  . Coronary atherosclerosis of native coronary artery 01/05/2016  . Aortic atherosclerosis (HCC) 01/05/2016  . Bradycardia 01/05/2016  . Anxiety 06/17/2015  . Malignant neoplasm of urinary bladder (HCC) 06/17/2015  . Allergic rhinitis 05/14/2015  . PAF (paroxysmal atrial fibrillation) (HCC) 08/30/2013  . Acid reflux 08/30/2013  . Tremor, essential 10/22/2012  . Hyperlipidemia 10/22/2012  . HTN (hypertension) 08/01/2012  . Type 2 diabetes mellitus  with complication, without long-term current use of insulin (HCC) 08/01/2012  . Benign prostatic hyperplasia with urinary obstruction 09/02/2011    Past Surgical History:  Procedure Laterality Date  . bladder cancer    . CATARACT EXTRACTION    . HERNIA REPAIR    . LEFT HEART CATH AND CORONARY ANGIOGRAPHY N/A 07/11/2016   Procedure: Left Heart Cath and Coronary Angiography;  Surgeon: Arida, Muhammad A, MD;  Location: [facility] INVASIVE CV LAB;  Service: Cardiovascular;  Laterality: N/A;    Prior to Admission medications   Medication Sig Start Date End Date Taking? Authorizing Provider  acetaminophen (TYLENOL) 325 MG tablet Take 2 tablets (650 mg total) by mouth every 6 (six) hours as needed for mild pain (or Fever >/= 101). 06/22/15   Gouru, Aruna, MD  ALPRAZolam (XANAX) 0.25 MG tablet take 1 tablet by mouth every 12 hours if needed for anxiety 07/25/16   Vachhani, Vaibhavkumar, MD  Alum & Mag Hydroxide-Simeth (GI COCKTAIL) SUSP suspension Take 30 mLs by mouth 4 (four) times daily as needed for indigestion (or chest pain). Shake well. 07/22/16   Kalisetti, Radhika, MD  amLODipine (NORVASC) 10 MG tablet Take 1 tablet (10 mg total) by mouth daily. Patient taking differently: Take 5 mg by mouth daily.  07/26/16   Vachhani, Vaibhavkumar, MD  amLODipine (NORVASC) 5 MG tablet Take 5 mg by mouth daily. 07/26/16   [provider]  aspirin EC 81 MG tablet Take 1 tablet (81 mg total) by mouth daily. 03/15/16   Cook, Jayce G, DO  atorvastatin (LIPITOR) 80 MG tablet TAKE 1 TABLET ONE TIME   DAILY 11/04/15   Cook, Jayce G, DO  blood glucose meter kit and supplies KIT Dispense based on patient and insurance preference. Use up to four times daily as directed. 08/01/16   Cook, Jayce G, DO  fexofenadine (ALLEGRA) 180 MG tablet Take 180 mg by mouth daily as needed for allergies or rhinitis.    [provider]  glimepiride (AMARYL) 4 MG tablet take 1 tablet by mouth WITH BREAKFAST. 07/05/16   Cook, Jayce G, DO    ipratropium-albuterol (DUONEB) 0.5-2.5 (3) MG/3ML SOLN Take 3 mLs by nebulization every 6 (six) hours as needed. 06/08/15   Patel, Shreyang, MD  lisinopril (PRINIVIL,ZESTRIL) 20 MG tablet Take 1 tablet (20 mg total) by mouth daily. 07/26/16   Vachhani, Vaibhavkumar, MD  metFORMIN (GLUCOPHAGE) 1000 MG tablet Take 1,000 mg by mouth 2 (two) times daily with a meal.    [provider]  pantoprazole (PROTONIX) 40 MG tablet Take 1 tablet (40 mg total) by mouth daily. 07/23/16   Kalisetti, Radhika, MD  PARoxetine (PAXIL) 40 MG tablet take 1 tablet by mouth once daily 05/23/16   Cook, Jayce G, DO  primidone (MYSOLINE) 50 MG tablet Take 1.5 tablets (75 mg total) by mouth 2 (two) times daily. 01/29/16   Cook, Jayce G, DO  tiotropium (SPIRIVA) 18 MCG inhalation capsule Place 18 mcg into inhaler and inhale daily.    [provider]    Allergies Latex  Family History  Problem Relation Age of Onset  . Arthritis Mother   . Dementia Mother   . Cancer Maternal Uncle        prostate  . Cancer Maternal Aunt        breast cancer    Social History Social History  Substance Use Topics  . Smoking status: Former Smoker    Packs/day: 1.50    Years: 40.00  . Smokeless tobacco: Never Used  . Alcohol use 7.2 oz/week    12 Standard drinks or equivalent per week     Comment: 1 drink with dinner    Review of Systems Constitutional: No fever/chills Eyes: No visual changes. ENT: No sore throat. Cardiovascular: Denies chest pain. Respiratory: Denies shortness of breath. Gastrointestinal: No abdominal pain.  No nausea, no vomiting.  No diarrhea.  No constipation. Genitourinary: Negative for dysuria. Musculoskeletal: Negative for neck pain.  Negative for back pain. Integumentary: Negative for rash. Neurological: Negative for headaches, focal weakness or numbness.positive for definitive  ____________________________________________   PHYSICAL EXAM:  VITAL SIGNS: ED Triage Vitals  Enc  Vitals Group     BP 08/24/16 2106 (!) 155/68     Pulse Rate 08/24/16 2106 67     Resp 08/24/16 2106 18     Temp 08/24/16 2106 (!) 97.5 F (36.4 C)     Temp Source 08/24/16 2106 Oral     SpO2 08/24/16 2106 100 %     Weight 08/24/16 2107 104.3 kg (230 lb)     Height 08/24/16 2107 1.803 m (5' 11")     Head Circumference --      Peak Flow --      Pain Score 08/24/16 2106 5     Pain Loc --      Pain Edu? --      Excl. in GC? --     Constitutional: Alert and oriented. Well appearing and in no acute distress. Eyes: Conjunctivae are normal. PERRL. EOMI. Head: Atraumatic. Ears:  Healthy appearing ear canals and TMs bilaterally Mouth/Throat: Mucous membranes are moist. Neck:   No stridor. Cardiovascular: Normal rate, regular rhythm. Good peripheral circulation. Grossly normal heart sounds. Respiratory: Normal respiratory effort.  No retractions. Lungs CTAB. Gastrointestinal: Soft and nontender. No distention.  Musculoskeletal: No lower extremity tenderness nor edema. No gross deformities of extremities. Neurologic:  Normal speech and language. No gross focal neurologic deficits are appreciated.  Skin:  Skin is warm, dry and intact. No rash noted. Psychiatric: Mood and affect are normal. Speech and behavior are normal.  ____________________________________________   LABS (all labs ordered are listed, but only abnormal results are displayed)  Labs Reviewed  CBC - Abnormal; Notable for the following:       Result Value   RBC 3.74 (*)    HCT 38.3 (*)    MCV 102.3 (*)    MCH 35.2 (*)    All other components within normal limits  BASIC METABOLIC PANEL  TROPONIN I   ____________________________________________  EKG  ED ECG REPORT I, Moorcroft N Godfrey Tritschler, the attending physician, personally viewed and interpreted this ECG.   Date: 08/25/2016  EKG Time: 9:10 PM  Rate: 69  Rhythm: Normal sinus rhythm  Axis: Normal  Intervals: Normal  ST&T Change:  None    Procedures   ____________________________________________   INITIAL IMPRESSION / ASSESSMENT AND PLAN / ED COURSE  Pertinent labs & imaging results that were available during my care of the patient were reviewed by me and considered in my medical decision making (see chart for details).  83 year old male presenting emergency department with persistent dizziness. Patient unable to ambulate secondary to dizziness. Temperature to perform an MRI of her patient was unable to tolerate laying on the table      ____________________________________________  FINAL CLINICAL IMPRESSION(S) / ED DIAGNOSES  Dizziness  MEDICATIONS GIVEN DURING THIS VISIT:  Medications - No data to display   NEW OUTPATIENT MEDICATIONS STARTED DURING THIS VISIT:  New Prescriptions   No medications on file    Modified Medications   No medications on file    Discontinued Medications   No medications on file     Note:  This document was prepared using Dragon voice recognition software and may include unintentional dictation errors.    Gregor Hams, MD 08/25/16 [phone]

## 2016-08-25 NOTE — Care Management Obs Status (Signed)
[facility] NOTIFICATION   Patient Details  Name: BRAXLEY BALANDRAN MRN: [id number] Date of Birth: [birth year]   Medicare Observation Status Notification Given:  Yes    Shelbie Ammons, RN 08/25/2016, 11:03 AM

## 2016-08-25 NOTE — H&P (Signed)
Chase Simmons is an 83 y.o. male.   Chief Complaint: Dizziness HPI: The patient with past medical history of COPD, CAD, diabetes, hypertension and atrial fibrillation presents to the emergency department complaining of dizziness. The patient was seen in the emergency department one day ago for the same. He states that he's been unable to sleep due to his dizziness. He also felt as if he may die and "did not want to die and that alone". CT of the head in the emergency department was without acute intracranial process. The patient was unable to undergo MRI in the ED due to inability to lay flat while acutely dizzy. He denies chest pain or shortness of breath but admits to overall weakness. With attempted ambulation the patient was unable to stand for smoking. Of time which prompted the emergency department staff to call the hospitalist service for admission.  Past Medical History:  Diagnosis Date  . Allergy   . Atrial fibrillation (HCC) 08/30/2013  . Cancer (HCC) pt unsure of year   bladder cancer-Dr. Cope  . COPD (chronic obstructive pulmonary disease) (HCC)   . Coronary atherosclerosis of native coronary artery 01/05/2016  . Hyperlipidemia   . Hypertension   . Tremor, essential    sees [facility] neurologist  . Type 2 diabetes mellitus with complication, without long-term current use of insulin (HCC) 08/01/2012  . Urinary incontinence     Past Surgical History:  Procedure Laterality Date  . bladder cancer    . CATARACT EXTRACTION    . HERNIA REPAIR    . LEFT HEART CATH AND CORONARY ANGIOGRAPHY N/A 07/11/2016   Procedure: Left Heart Cath and Coronary Angiography;  Surgeon: Arida, Muhammad A, MD;  Location: [facility] INVASIVE CV LAB;  Service: Cardiovascular;  Laterality: N/A;    Family History  Problem Relation Age of Onset  . Arthritis Mother   . Dementia Mother   . Cancer Maternal Uncle        prostate  . Cancer Maternal Aunt        breast cancer   Social History:  reports that he has quit  smoking. He has a 60.00 pack-year smoking history. He has never used smokeless tobacco. He reports that he drinks about 7.2 oz of alcohol per week . He reports that he does not use drugs.  Allergies:  Allergies  Allergen Reactions  . Latex Itching    Medications Prior to Admission  Medication Sig Dispense Refill  . acetaminophen (TYLENOL) 325 MG tablet Take 2 tablets (650 mg total) by mouth every 6 (six) hours as needed for mild pain (or Fever >/= 101).    . ALPRAZolam (XANAX) 0.25 MG tablet take 1 tablet by mouth every 12 hours if needed for anxiety 20 tablet 1  . Alum & Mag Hydroxide-Simeth (GI COCKTAIL) SUSP suspension Take 30 mLs by mouth 4 (four) times daily as needed for indigestion (or chest pain). Shake well. 200 mL 0  . amLODipine (NORVASC) 10 MG tablet Take 1 tablet (10 mg total) by mouth daily. (Patient taking differently: Take 5 mg by mouth daily. ) 30 tablet 0  . amLODipine (NORVASC) 5 MG tablet Take 5 mg by mouth daily.    . aspirin EC 81 MG tablet Take 1 tablet (81 mg total) by mouth daily. 90 tablet 3  . atorvastatin (LIPITOR) 80 MG tablet TAKE 1 TABLET ONE TIME DAILY 90 tablet 3  . blood glucose meter kit and supplies KIT Dispense based on patient and insurance preference. Use up to   four times daily as directed. 1 each 0  . fexofenadine (ALLEGRA) 180 MG tablet Take 180 mg by mouth daily as needed for allergies or rhinitis.    . glimepiride (AMARYL) 4 MG tablet take 1 tablet by mouth WITH BREAKFAST. 90 tablet 1  . ipratropium-albuterol (DUONEB) 0.5-2.5 (3) MG/3ML SOLN Take 3 mLs by nebulization every 6 (six) hours as needed. 360 mL   . lisinopril (PRINIVIL,ZESTRIL) 20 MG tablet Take 1 tablet (20 mg total) by mouth daily. 30 tablet 0  . metFORMIN (GLUCOPHAGE) 1000 MG tablet Take 1,000 mg by mouth 2 (two) times daily with a meal.    . pantoprazole (PROTONIX) 40 MG tablet Take 1 tablet (40 mg total) by mouth daily. 30 tablet 2  . PARoxetine (PAXIL) 40 MG tablet take 1 tablet by  mouth once daily 30 tablet 0  . primidone (MYSOLINE) 50 MG tablet Take 1.5 tablets (75 mg total) by mouth 2 (two) times daily. 270 tablet 1  . tiotropium (SPIRIVA) 18 MCG inhalation capsule Place 18 mcg into inhaler and inhale daily.      Results for orders placed or performed during the hospital encounter of 08/24/16 (from the past 48 hour(s))  Basic metabolic panel     Status: Abnormal   Collection Time: 08/24/16 11:51 PM  Result Value Ref Range   Sodium 139 135 - 145 mmol/L   Potassium 4.3 3.5 - 5.1 mmol/L   Chloride 105 101 - 111 mmol/L   CO2 25 22 - 32 mmol/L   Glucose, Bld 209 (H) 65 - 99 mg/dL   BUN 29 (H) 6 - 20 mg/dL   Creatinine, Ser 1.13 0.61 - 1.24 mg/dL   Calcium 9.2 8.9 - 10.3 mg/dL   GFR calc non Af Amer 59 (L) >60 mL/min   GFR calc Af Amer >60 >60 mL/min    Comment: (NOTE) The eGFR has been calculated using the CKD EPI equation. This calculation has not been validated in all clinical situations. eGFR's persistently <60 mL/min signify possible Chronic Kidney Disease.    Anion gap 9 5 - 15  CBC     Status: Abnormal   Collection Time: 08/24/16 11:51 PM  Result Value Ref Range   WBC 8.1 3.8 - 10.6 K/uL   RBC 3.74 (L) 4.40 - 5.90 MIL/uL   Hemoglobin 13.2 13.0 - 18.0 g/dL   HCT 38.3 (L) 40.0 - 52.0 %   MCV 102.3 (H) 80.0 - 100.0 fL   MCH 35.2 (H) 26.0 - 34.0 pg   MCHC 34.4 32.0 - 36.0 g/dL   RDW 13.4 11.5 - 14.5 %   Platelets 162 150 - 440 K/uL  Troponin I     Status: None   Collection Time: 08/24/16 11:51 PM  Result Value Ref Range   Troponin I <0.03 <0.03 ng/mL  TSH     Status: None   Collection Time: 08/24/16 11:51 PM  Result Value Ref Range   TSH 1.631 0.350 - 4.500 uIU/mL    Comment: Performed by a 3rd Generation assay with a functional sensitivity of <=0.01 uIU/mL.   Ct Head Wo Contrast  Result Date: 08/23/2016 CLINICAL DATA:  Vertigo. EXAM: CT HEAD WITHOUT CONTRAST TECHNIQUE: Contiguous axial images were obtained from the base of the skull through  the vertex without intravenous contrast. COMPARISON:  07/05/2016 FINDINGS: Brain: There is no evidence for acute hemorrhage, hydrocephalus, mass lesion, or abnormal extra-axial fluid collection. No definite CT evidence for acute infarction. Diffuse loss of parenchymal volume is consistent with   atrophy. Vascular: No hyperdense vessel or unexpected calcification. Skull: No evidence for fracture. No worrisome lytic or sclerotic lesion. Sinuses/Orbits: Visualized portions of the globes and intraorbital fat are unremarkable. The visualized paranasal sinuses and mastoid air cells are clear. Other: None. IMPRESSION: 1. Stable exam.  No acute intracranial abnormality. 2. Atrophy with chronic small vessel white matter ischemic disease. Electronically Signed   By: Eric  Mansell M.D.   On: 08/23/2016 08:44    Review of Systems  Constitutional: Negative for chills and fever.  HENT: Negative for sore throat and tinnitus.   Eyes: Negative for blurred vision and redness.  Respiratory: Negative for cough and shortness of breath.   Cardiovascular: Negative for chest pain, palpitations, orthopnea and PND.  Gastrointestinal: Negative for abdominal pain, diarrhea, nausea and vomiting.  Genitourinary: Negative for dysuria, frequency and urgency.  Musculoskeletal: Negative for joint pain and myalgias.  Skin: Negative for rash.       No lesions  Neurological: Positive for dizziness and weakness. Negative for speech change and focal weakness.  Endo/Heme/Allergies: Does not bruise/bleed easily.       No temperature intolerance  Psychiatric/Behavioral: Negative for depression and suicidal ideas.    Blood pressure (!) 148/67, pulse 62, temperature 98.1 F (36.7 C), temperature source Oral, resp. rate 20, height 5' 11" (1.803 m), weight 111.8 kg (246 lb 6.4 oz), SpO2 98 %. Physical Exam  Constitutional: He is oriented to person, place, and time. He appears well-developed and well-nourished. No distress.  HENT:  Head:  Normocephalic and atraumatic.  Mouth/Throat: Oropharynx is clear and moist.  Eyes: Pupils are equal, round, and reactive to light. Conjunctivae and EOM are normal. No scleral icterus.  Neck: Normal range of motion. Neck supple. No JVD present. No tracheal deviation present. No thyromegaly present.  Cardiovascular: Normal rate, regular rhythm and normal heart sounds.  Exam reveals no gallop and no friction rub.   No murmur heard. Respiratory: Effort normal and breath sounds normal. No respiratory distress. He has no wheezes.  GI: Soft. Bowel sounds are normal. He exhibits no distension. There is no tenderness.  Genitourinary:  Genitourinary Comments: Deferred  Musculoskeletal: Normal range of motion. He exhibits no edema.  Lymphadenopathy:    He has no cervical adenopathy.  Neurological: He is alert and oriented to person, place, and time. No cranial nerve deficit.  Skin: Skin is warm and dry. No rash noted. No erythema.  Psychiatric: He has a normal mood and affect. His behavior is normal. Judgment and thought content normal.     Assessment/Plan This is an 83-year-old male admitted for dizziness. 1. Dizziness: The patient is not the best historian thus it is difficult to characterize sensation as vertigo versus dizziness. Nonetheless the patient will likely need some sedation for MRI to completely rule out stroke. He has no motor or sensory deficits at this time. Continue to monitor. 2. Atrial fibrillation: Paroxysmal; the patient has not experienced palpitations. He is not on antiplatelet or anticoagulation therapy likely due to fall risk. 3. Hypertension: Acceptable for age; fluctuates. Continue amlodipine and lisinopril 4. CAD: Stable; continue aspirin. 5. COPD: Stable; continue Spiriva. Albuterol as needed. 6. Diabetes mellitus type 2: Hold oral agents. Sliding scale insulin while hospitalized. 7. Hyperlipidemia: Continue statin therapy 8. Tremor: Continue Prinivil 9. Depression:  Continue paroxetine 10. DVT prophylaxis: Lovenox 11. GI prophylaxis: GI cocktail as needed; continue pantoprazole per home regimen The patient is a full code. Time spent on admission orders and patient care approximately 45 minutes  ,    Norva Riffle, MD 08/25/2016, 7:15 AM

## 2016-08-26 ENCOUNTER — Observation Stay: Payer: Medicare HMO

## 2016-08-26 ENCOUNTER — Telehealth: Payer: Self-pay | Admitting: Family Medicine

## 2016-08-26 DIAGNOSIS — I4891 Unspecified atrial fibrillation: Secondary | ICD-10-CM | POA: Diagnosis not present

## 2016-08-26 DIAGNOSIS — I10 Essential (primary) hypertension: Secondary | ICD-10-CM | POA: Diagnosis not present

## 2016-08-26 DIAGNOSIS — R42 Dizziness and giddiness: Secondary | ICD-10-CM | POA: Diagnosis not present

## 2016-08-26 DIAGNOSIS — I2510 Atherosclerotic heart disease of native coronary artery without angina pectoris: Secondary | ICD-10-CM | POA: Diagnosis not present

## 2016-08-26 LAB — GLUCOSE, CAPILLARY
Glucose-Capillary: 147 mg/dL — ABNORMAL HIGH (ref 65–99)
Glucose-Capillary: 161 mg/dL — ABNORMAL HIGH (ref 65–99)
Glucose-Capillary: 105 mg/dL — ABNORMAL HIGH (ref 65–99)

## 2016-08-26 MED ORDER — MECLIZINE HCL 12.5 MG PO TABS: 12.5000 mg | ORAL_TABLET | Freq: Three times a day (TID) | ORAL | 0 refills | Status: DC | PRN

## 2016-08-26 MED ORDER — METFORMIN HCL 500 MG PO TABS
1000.0000 mg | ORAL_TABLET | Freq: Two times a day (BID) | ORAL | Status: DC
  Administered 2016-08-26 (×2): 1000 mg via ORAL
  Filled 2016-08-26 (×2): qty 2

## 2016-08-26 MED ORDER — AMLODIPINE BESYLATE 10 MG PO TABS: 5.0000 mg | ORAL_TABLET | Freq: Every day | ORAL | 0 refills | Status: DC

## 2016-08-26 MED ORDER — GLIMEPIRIDE 2 MG PO TABS
4.0000 mg | ORAL_TABLET | Freq: Every day | ORAL | Status: DC
Start: 2016-08-26 — End: 2016-08-26
  Administered 2016-08-26: 13:00:00 4 mg via ORAL
  Filled 2016-08-26: qty 1

## 2016-08-26 NOTE — Telephone Encounter (Signed)
HFU, Dx was Dizziness. Pt is going to be discharged form hospital today. Pt is scheduled. Thank you!

## 2016-08-26 NOTE — Plan of Care (Signed)
Patient's daughter, [name] showed up at 18:50.  She was concerned that her father had been sedated for MRI and wasn't safe to go home.  She was reassured when she saw him use walker and stand to put clothes on.  She didn't want patient to go into his apartment complex with EMS and have everyone see him.  She asked [place] to cancel EMS trip, sd she'd get him home and into his apartment with his wheelchair. Did as she requested.  Patient wheeled out by Nurse Tech.

## 2016-08-26 NOTE — Progress Notes (Signed)
[facility] [place] NAME: Chase Simmons    MR#:  [id number]  DATE OF BIRTH:  03-03-1933  SUBJECTIVE:   Came in with dizziness. Patient said he had a fall was not able to describe how it happened. Denies any frequent falls. Denies any dizziness today. REVIEW OF SYSTEMS:   Review of Systems  Constitutional: Negative for chills, fever and weight loss.  HENT: Negative for ear discharge, ear pain and nosebleeds.   Eyes: Negative for blurred vision, pain and discharge.  Respiratory: Negative for sputum production, shortness of breath, wheezing and stridor.   Cardiovascular: Negative for chest pain, palpitations, orthopnea and PND.  Gastrointestinal: Negative for abdominal pain, diarrhea, nausea and vomiting.  Genitourinary: Negative for frequency and urgency.  Musculoskeletal: Negative for back pain and joint pain.  Neurological: Positive for dizziness and weakness. Negative for sensory change, speech change and focal weakness.  Psychiatric/Behavioral: Negative for depression and hallucinations. The patient is not nervous/anxious.    Tolerating Diet:yes Tolerating PT: pending  DRUG ALLERGIES:   Allergies  Allergen Reactions  . Latex Itching    VITALS:  Blood pressure (!) 154/58, pulse (!) 56, temperature 98.1 F (36.7 C), resp. rate 16, height 5\' 11"  (1.803 m), weight 112.1 kg (247 lb 3.2 oz), SpO2 96 %.  PHYSICAL EXAMINATION:   Physical Exam  GENERAL:  83 y.o.-year-old patient lying in the bed with no acute distress. Disheveled, obese EYES: Pupils equal, round, reactive to light and accommodation. No scleral icterus. Extraocular muscles intact.  HEENT: Head atraumatic, normocephalic. Oropharynx and nasopharynx clear.  NECK:  Supple, no jugular venous distention. No thyroid enlargement, no tenderness.  LUNGS: Normal breath sounds bilaterally, no wheezing, rales, rhonchi. No use of accessory muscles of respiration.   CARDIOVASCULAR: S1, S2 normal. No murmurs, rubs, or gallops.  ABDOMEN: Soft, nontender, nondistended. Bowel sounds present. No organomegaly or mass.  EXTREMITIES: No cyanosis, clubbing or edema b/l.    NEUROLOGIC: Cranial nerves II through XII are intact. No focal Motor or sensory deficits b/l.   PSYCHIATRIC:  patient is alert and orineted.  SKIN: No obvious rash, lesion, or ulcer.   LABORATORY PANEL:  CBC  Recent Labs Lab 08/24/16 2351  WBC 8.1  HGB 13.2  HCT 38.3*  PLT 162    Chemistries   Recent Labs Lab 08/23/16 0819 08/24/16 2351  NA 140 139  K 3.7 4.3  CL 105 105  CO2 23 25  GLUCOSE 138* 209*  BUN 30* 29*  CREATININE 1.24 1.13  CALCIUM 9.6 9.2  AST 26  --   ALT 24  --   ALKPHOS 75  --   BILITOT 0.7  --    Cardiac Enzymes  Recent Labs Lab 08/24/16 2351  TROPONINI <0.03   RADIOLOGY:  No results found. ASSESSMENT AND PLAN:  Chase Simmons is a 83 y.o. male return to the emergency department with acute onset of dizziness tonight. Patient states that he's had multiple episodes of dizziness over the past few months however it has worsened over the past week. Patient was recently seen on 08/23/2016 for the same with a negative CT scan performed at that time  1. Dizziness: - patient is not the best historian thus it is difficult to characterize sensation as vertigo versus dizziness. Nonetheless the patient will likely need some sedation for MRI to completely rule out stroke. He has no motor or sensory deficits at this time. -When necessary meclizine -We'll try to get MRI  after giving IV sedation with 2 mg of Ativan--- if patient unable to lay flat will discontinue MRI orders -No neuro deficits noted -Get physical therapy to see patient -Cannot get any clear history from patient appears to be a poor historian  2. Atrial fibrillation: Paroxysmal; the patient has not experienced palpitations. He is not on antiplatelet or anticoagulation therapy likely due to  fall risk.  3. Hypertension:  - Continue amlodipine and lisinopril  4. CAD: Stable; continue aspirin.  5. COPD: Stable; continue Spiriva. Albuterol as needed.  6. Diabetes mellitus type 2: resume oral agents. Sliding scale insulin while hospitalized.  7. Hyperlipidemia: Continue statin therapy  8. Tremor: Continue Prinivil  9. Depression: Continue paroxetine  10. DVT prophylaxis: Lovenox  PT to see pt CSW/CM for d/c planing Left message for tammy Norgaard--dter  Case discussed with Care Management/Social Worker. Management plans discussed with the patient and they are in agreement.  CODE STATUS: Full  DVT Prophylaxis: lovenox  TOTAL TIME TAKING CARE OF THIS PATIENT: *30* minutes.  >50% time spent on counselling and coordination of care  POSSIBLE D/C IN *1-2* DAYS, DEPENDING ON CLINICAL CONDITION.  Note: This dictation was prepared with Dragon dictation along with smaller phrase technology. Any transcriptional errors that result from this process are unintentional.  Chase Simmons M.D on 08/26/2016 at 8:41 AM  Between 7am to 6pm - Pager - [phone]  After 6pm go to [email or web address] - password [email or web address] [facility]  Office  [phone]  CC: Primary care physician; Coral Spikes, DO

## 2016-08-26 NOTE — Care Management (Signed)
Received text message from [facility] representative. Indicates that [facility] is currently in the home.  Physical therapy evaluation completed. Recommending home with home health and physical therapy. Physical therapy needs to focus on vestibular exercises.  Will add social worker to services in the home.  Will receive ativan prior to MRI today. Discharge to home today per Dr. Posey Pronto.  Discussed transportation with Mr. Sheller at the bedside. States one of his daughters would transport Shelbie Ammons RN MSN [place] Management [phone]

## 2016-08-26 NOTE — Discharge Summary (Signed)
Ch[facility] Simmons at [place] NAME: Chase Simmons    MR#:  [id number]  DATE OF BIRTH:  1933/07/20  DATE OF ADMISSION:  08/24/2016 ADMITTING PHYSICIAN: Chase Foreman, MD  DATE OF DISCHARGE: 08/26/16  PRIMARY CARE PHYSICIAN: Chase Spikes, DO    ADMISSION DIAGNOSIS:  ems wait dizzy  DISCHARGE DIAGNOSIS:  Dizziness--improved  SECONDARY DIAGNOSIS:   Past Medical History:  Diagnosis Date  . Allergy   . Atrial fibrillation ([place]) 08/30/2013  . Cancer [facility]) pt unsure of year   bladder cancer-Dr. Jacqlyn Simmons  . COPD (chronic obstructive pulmonary disease) ([place])   . Coronary atherosclerosis of native coronary artery 01/05/2016  . Hyperlipidemia   . Hypertension   . Tremor, essential    sees [facility] neurologist  . Type 2 diabetes mellitus with complication, without long-term current use of insulin ([place]) 08/01/2012  . Urinary incontinence     HOSPITAL COURSE:  Chase Simmons a 83 y.o.malereturn to the emergency department with acute onset of dizziness tonight. Patient states that he's had multiple episodes of dizziness over the past few months however it has worsened over the past week. Patient was recently seen on 08/23/2016 for the same with a negative CT scan performed at that time  1. Dizziness: -unable to get MRI since patient unable to lay flat will discontinue MRI orders -No neuro deficits noted - physical therapy recommends HHPT -Cannot get any clear history from patient appears to be a poor historian -prn meclizine -PCP to refer to ENT if no improvement as out pt.  2. Atrial fibrillation: Paroxysmal; the patient has not experienced palpitations.  -He is not on antiplatelet or anticoagulation therapy likely due to fall risk.  3. Hypertension:  - Continue amlodipine andlisinopril  4. CAD: Stable; continue aspirin.  5. COPD: Stable; continue Spiriva. Albuterol as needed.  6. Diabetes mellitus type 2: resume oral agents.  Sliding scale insulin while hospitalized.  7. Hyperlipidemia: Continue statin therapy  8. Tremor: Continue Prinivil  9. Depression: Continue paroxetine  10. DVT prophylaxis: Lovenox  CM for d/c planing Left message for tammy Thorup--dter  D/c home CONSULTS OBTAINED:    DRUG ALLERGIES:   Allergies  Allergen Reactions  . Latex Itching    DISCHARGE MEDICATIONS:   Current Discharge Medication List    START taking these medications   Details  meclizine (ANTIVERT) 12.5 MG tablet Take 1 tablet (12.5 mg total) by mouth 3 (three) times daily as needed for dizziness. Qty: 30 tablet, Refills: 0      CONTINUE these medications which have NOT CHANGED   Details  amLODipine (NORVASC) 5 MG tablet Take 5 mg by mouth daily.    aspirin EC 81 MG tablet Take 1 tablet (81 mg total) by mouth daily. Qty: 90 tablet, Refills: 3    atorvastatin (LIPITOR) 80 MG tablet TAKE 1 TABLET ONE TIME DAILY Qty: 90 tablet, Refills: 3    fexofenadine (ALLEGRA) 180 MG tablet Take 180 mg by mouth daily as needed for allergies or rhinitis.    glimepiride (AMARYL) 4 MG tablet take 1 tablet by mouth WITH BREAKFAST. Qty: 90 tablet, Refills: 1    lisinopril (PRINIVIL,ZESTRIL) 20 MG tablet Take 1 tablet (20 mg total) by mouth daily. Qty: 30 tablet, Refills: 0    metFORMIN (GLUCOPHAGE) 1000 MG tablet Take 1,000 mg by mouth 2 (two) times daily with a meal.    pantoprazole (PROTONIX) 40 MG tablet Take 1 tablet (40 mg total) by mouth daily.  Qty: 30 tablet, Refills: 2    PARoxetine (PAXIL) 40 MG tablet take 1 tablet by mouth once daily Qty: 30 tablet, Refills: 0    primidone (MYSOLINE) 50 MG tablet Take 1.5 tablets (75 mg total) by mouth 2 (two) times daily. Qty: 270 tablet, Refills: 1    tiotropium (SPIRIVA) 18 MCG inhalation capsule Place 18 mcg into inhaler and inhale daily.    acetaminophen (TYLENOL) 325 MG tablet Take 2 tablets (650 mg total) by mouth every 6 (six) hours as needed for mild  pain (or Fever >/= 101).    ALPRAZolam (XANAX) 0.25 MG tablet take 1 tablet by mouth every 12 hours if needed for anxiety Qty: 20 tablet, Refills: 1    Alum & Mag Hydroxide-Simeth (GI COCKTAIL) SUSP suspension Take 30 mLs by mouth 4 (four) times daily as needed for indigestion (or chest pain). Shake well. Qty: 200 mL, Refills: 0    blood glucose meter kit and supplies KIT Dispense based on patient and insurance preference. Use up to four times daily as directed. Qty: 1 each, Refills: 0    ipratropium-albuterol (DUONEB) 0.5-2.5 (3) MG/3ML SOLN Take 3 mLs by nebulization every 6 (six) hours as needed. Qty: 360 mL        If you experience worsening of your admission symptoms, develop shortness of breath, life threatening emergency, suicidal or homicidal thoughts you must seek medical attention immediately by calling 911 or calling your MD immediately  if symptoms less severe.  You Must read complete instructions/literature along with all the possible adverse reactions/side effects for all the Medicines you take and that have been prescribed to you. Take any new Medicines after you have completely understood and accept all the possible adverse reactions/side effects.   Please note  You were cared for by a hospitalist during your hospital stay. If you have any questions about your discharge medications or the care you received while you were in the hospital after you are discharged, you can call the unit and asked to speak with the hospitalist on call if the hospitalist that took care of you is not available. Once you are discharged, your primary care physician will handle any further medical issues. Please note that NO REFILLS for any discharge medications will be authorized once you are discharged, as it is imperative that you return to your primary care physician (or establish a relationship with a primary care physician if you do not have one) for your aftercare needs so that they can reassess  your need for medications and monitor your lab values. Today   SUBJECTIVE   No new complaints  VITAL SIGNS:  Blood pressure (!) 164/84, pulse (!) 56, temperature 98.1 F (36.7 C), resp. rate 16, height _0  (1.803 m), weight 112.1 kg (247 lb 3.2 oz), SpO2 96 %.  I/O:    Intake/Output Summary (Last 24 hours) at 08/26/16 1108 Last data filed at 08/26/16 0955  Gross per 24 hour  Intake             1060 ml  Output             1100 ml  Net              -40 ml    PHYSICAL EXAMINATION:  GENERAL:  83 y.o.-year-old patient lying in the bed with no acute distress.  EYES: Pupils equal, round, reactive to light and accommodation. No scleral icterus. Extraocular muscles intact.  HEENT: Head atraumatic, normocephalic. Oropharynx and nasopharynx clear.  NECK:  Supple, no jugular venous distention. No thyroid enlargement, no tenderness.  LUNGS: Normal breath sounds bilaterally, no wheezing, rales,rhonchi or crepitation. No use of accessory muscles of respiration.  CARDIOVASCULAR: S1, S2 normal. No murmurs, rubs, or gallops.  ABDOMEN: Soft, non-tender, non-distended. Bowel sounds present. No organomegaly or mass.  EXTREMITIES: No pedal edema, cyanosis, or clubbing.  NEUROLOGIC: Cranial nerves II through XII are intact. Muscle strength 5/5 in all extremities. Sensation intact. Gait not checked.  PSYCHIATRIC: The patient is alert and oriented x 3.  SKIN: No obvious rash, lesion, or ulcer.   DATA REVIEW:   CBC   Recent Labs Lab 08/24/16 2351  WBC 8.1  HGB 13.2  HCT 38.3*  PLT 162    Chemistries   Recent Labs Lab 08/23/16 0819 08/24/16 2351  NA 140 139  K 3.7 4.3  CL 105 105  CO2 23 25  GLUCOSE 138* 209*  BUN 30* 29*  CREATININE 1.24 1.13  CALCIUM 9.6 9.2  AST 26  --   ALT 24  --   ALKPHOS 75  --   BILITOT 0.7  --     Microbiology Results   No results found for this or any previous visit (from the past 240 hour(s)).  RADIOLOGY:  No results found.   Management  plans discussed with the patient, family and they are in agreement.  CODE STATUS:     Code Status Orders        Start     Ordered   08/25/16 0305  Full code  Continuous     08/25/16 0304    Code Status History    Date Active Date Inactive Code Status Order ID Comments User Context   07/21/2016 12:49 AM 07/25/2016  8:10 PM Full Code [id number]  Hugelmeyer, Ubaldo Glassing, DO Inpatient   07/10/2016  9:34 AM 07/13/2016  5:19 PM Full Code [id number]  Saundra Shelling, MD Inpatient   06/19/2015 12:21 AM 06/23/2015 12:47 AM Full Code [id number]  Alesia Richards, MD ED   06/04/2015  8:13 PM 06/08/2015  6:33 PM Full Code [id number]  Loletha Grayer, MD ED      TOTAL TIME TAKING CARE OF THIS PATIENT: *40* minutes.    Cherrise Occhipinti M.D on 08/26/2016 at 11:08 AM  Between 7am to 6pm - Pager - [phone] After 6pm go to [email or web address] - password [email or web address] [facility]  Office  [phone]  CC: Primary care physician; Chase Spikes, DO

## 2016-08-26 NOTE — Plan of Care (Signed)
Problem: Health Behavior/Discharge Planning: Goal: Ability to manage health-related needs will improve Outcome: Not Progressing Pt alert and oriented, tylenol for back pain with improvement, Meclizine PRN this shift for vertigo with relief. Continue to monitor

## 2016-08-26 NOTE — Clinical Social Work Note (Signed)
Pt is ready for discharge today and will return home, per MD note. CSW is signing off as no further needs identified.   Darden Dates, MSW, LCSW  Clinical Social Worker  [phone]

## 2016-08-26 NOTE — Clinical Social Work Note (Signed)
CSW prepared EMS for transportation home.   Darden Dates, MSW, LCSW  Clinical Social Worker [phone]

## 2016-08-26 NOTE — Evaluation (Signed)
Physical Therapy Evaluation Patient Details Name: Chase Simmons MRN: [id number] DOB: 10-20-1933 Today's Date: 08/26/2016   History of Present Illness  presented to ER and admitted under observation secondary to intermittent dizzy episodes. Head CT negative for acute injury; unable to complete MRI due to inability to tolerate flat, supine position.  Clinical Impression  Patient with presentation suggestive of R ear BPPV. Unable to tolerate flat/supine position for full Dix-Hallpike; assessment/treatment completed via Liberatory/semont maneuver.  Symptoms fully resolved with treatment. Patient able to demonstrate ability to perform bed mobility and basic transfers (squat pivot) at cga/close sup, comparable to baseline level of function. Would benefit from skilled PT to address above deficits and promote optimal return to PLOF; Recommend transition to HHPT for continued vestibular treatment upon discharge from acute hospitalization.     Follow Up Recommendations Home health PT (emphasis on vestibular treatment/assessment)    Equipment Recommendations       Recommendations for Other Services       Precautions / Restrictions Precautions Precautions: Fall Restrictions Weight Bearing Restrictions: No      Mobility  Bed Mobility Overal bed mobility: Modified Independent                Transfers Overall transfer level: Needs assistance Equipment used: Rolling walker (2 wheeled) Transfers: Sit to/from [employer] Sit to Stand: Min guard         General transfer comment: requires UE support, increased time to complete; posterior surface of LEs against edge of bed for stabilization  Ambulation/Gait             General Gait Details: not tested; non-ambulatory at baseline  Stairs            Wheelchair Mobility    Modified Rankin (Stroke Patients Only)       Balance Overall balance assessment: Needs assistance Sitting-balance support: No  upper extremity supported Sitting balance-Leahy Scale: Good     Standing balance support: Bilateral upper extremity supported Standing balance-Leahy Scale: Fair                               Pertinent Vitals/Pain Pain Assessment: No/denies pain    Home Living Family/patient expects to be discharged to:: Private residence Living Arrangements: Alone Available Help at Discharge: Family;Available PRN/intermittently   Home Access: Elevator     Home Layout: One level Home Equipment: Walker - 2 wheels;Bedside commode;Wheelchair - manual      Prior Function           Comments: WC level as primary mobility, completing stand/squat pivot transfers to/from seating surfaces (with UE support at all times).  Mod indep with ADLs; does perform LBD in standing.     Hand Dominance        Extremity/Trunk Assessment   Upper Extremity Assessment Upper Extremity Assessment:  (R shoulder with significant ROM limitation in shoulder elevation (previous injury); L UE grossly WFL)    Lower Extremity Assessment Lower Extremity Assessment:  (grossly 4/5 throughout)       Communication   Communication: HOH  Cognition Arousal/Alertness: Awake/alert Behavior During Therapy: WFL for tasks assessed/performed Overall Cognitive Status: History of cognitive impairments - at baseline                                        General Comments  Exercises Other Exercises Other Exercises: Assessed for vestibular symptoms with testing suggestive of involvement in R ear. Unable to tolerate flat, supine position for full Dix-Hallpike; assessed via Liberatory/Semont maneuver with additional repetition for treatment.  Moderate symptoms of dizziness reported with initial trial; resolved with second trial.  Nystagmus difficult to fully observe/quantify due to significant resting tremor in head, neck and eyebrows noted during session.  Will re-assess and re-treat in subsequent  visits as appropriate. Other Exercises: Bed/chair transfer, squat pivot, cga bilat.  Does require UE placement with transfer at all times.   Assessment/Plan    PT Assessment Patient needs continued PT services  PT Problem List Decreased activity tolerance;Decreased balance;Decreased cognition       PT Treatment Interventions DME instruction;Functional mobility training;Therapeutic activities;Therapeutic exercise;Balance training;Patient/family education    PT Goals (Current goals can be found in the Care Plan section)  Acute Rehab PT Goals Patient Stated Goal: to return home PT Goal Formulation: With patient Time For Goal Achievement: 09/09/16 Potential to Achieve Goals: Good    Frequency Min 2X/week   Barriers to discharge Decreased caregiver support      Co-evaluation               AM-PAC PT "6 Clicks" Daily Activity  Outcome Measure Difficulty turning over in bed (including adjusting bedclothes, sheets and blankets)?: None Difficulty moving from lying on back to sitting on the side of the bed? : None Difficulty sitting down on and standing up from a chair with arms (e.g., wheelchair, bedside commode, etc,.)?: Unable Help needed moving to and from a bed to chair (including a wheelchair)?: A Little Help needed walking in hospital room?: A Lot Help needed climbing 3-5 steps with a railing? : A Lot 6 Click Score: 16    End of Session   Activity Tolerance: Patient tolerated treatment well Patient left: in bed;with call bell/phone within reach;with bed alarm set Nurse Communication: Mobility status PT Visit Diagnosis: BPPV BPPV - Right/Left : Right    Time: 6144-3154 PT Time Calculation (min) (ACUTE ONLY): 58 min   Charges:   PT Evaluation $PT Eval Moderate Complexity: 1 Mod PT Treatments $Therapeutic Activity: 8-22 mins $Canalith Rep Proc: 23-37 mins   PT G Codes:   PT G-Codes **NOT FOR INPATIENT CLASS** Functional Assessment Tool Used: AM-PAC 6 Clicks  Basic Mobility Functional Limitation: Mobility: Walking and moving around Mobility: Walking and Moving Around Current Status (M0867): At least 20 percent but less than 40 percent impaired, limited or restricted Mobility: Walking and Moving Around Goal Status [phone]): At least 1 percent but less than 20 percent impaired, limited or restricted    Bing Duffey H. Owens Shark, PT, DPT, NCS 08/26/16, 5:11 PM [phone]

## 2016-08-26 NOTE — Progress Notes (Signed)
Pt was unable to complete MRI even after receiving 2 mg ativan.  PT worked with pt today and recommended home with home health and PT.  Patient is followed by [facility].  Care Mgr is requesting social worker to evaluate home situation; also requesting a Nurse Tech and requesting Home Health nurse to do vestibular exercises.  Patient has severe hand tremors - said he's had them for approx 4 yrs. States that he uses walker at home and is able to care for himself.  Spoke to daughter, [name].  She states they are unable to get him home b/c they work and they can't get him physically inside.  SW is preparing EMS form for him to be transported.  Dr. Posey Pronto states he must be d/ced today.

## 2016-08-29 ENCOUNTER — Telehealth: Payer: Self-pay | Admitting: Family Medicine

## 2016-08-29 DIAGNOSIS — M6281 Muscle weakness (generalized): Secondary | ICD-10-CM | POA: Diagnosis not present

## 2016-08-29 DIAGNOSIS — G250 Essential tremor: Secondary | ICD-10-CM | POA: Diagnosis not present

## 2016-08-29 DIAGNOSIS — I110 Hypertensive heart disease with heart failure: Secondary | ICD-10-CM | POA: Diagnosis not present

## 2016-08-29 DIAGNOSIS — I5020 Unspecified systolic (congestive) heart failure: Secondary | ICD-10-CM | POA: Diagnosis not present

## 2016-08-29 DIAGNOSIS — G40909 Epilepsy, unspecified, not intractable, without status epilepticus: Secondary | ICD-10-CM | POA: Diagnosis not present

## 2016-08-29 DIAGNOSIS — I480 Paroxysmal atrial fibrillation: Secondary | ICD-10-CM | POA: Diagnosis not present

## 2016-08-29 DIAGNOSIS — I2510 Atherosclerotic heart disease of native coronary artery without angina pectoris: Secondary | ICD-10-CM | POA: Diagnosis not present

## 2016-08-29 DIAGNOSIS — K8020 Calculus of gallbladder without cholecystitis without obstruction: Secondary | ICD-10-CM | POA: Diagnosis not present

## 2016-08-29 DIAGNOSIS — E119 Type 2 diabetes mellitus without complications: Secondary | ICD-10-CM | POA: Diagnosis not present

## 2016-08-29 DIAGNOSIS — J449 Chronic obstructive pulmonary disease, unspecified: Secondary | ICD-10-CM | POA: Diagnosis not present

## 2016-08-29 NOTE — Telephone Encounter (Signed)
Aldrin from [facility] called looking for verbal orders for home health pt for 2x 5 weeks, home health nurse for 2x 5 weeks for grooming and bathing. Please advise, thank you!  Call Aldrin @ [phone]

## 2016-08-29 NOTE — Telephone Encounter (Signed)
First attempt to TCM tried to reach patient by phone no answer and no voicemail. Will continue to monitor for TCM.

## 2016-08-30 NOTE — Telephone Encounter (Signed)
Aldrin [facility] given orders as stated below

## 2016-08-30 NOTE — Telephone Encounter (Signed)
Second attempt made for TCM call no answr and no voicemail unable to reach patient will continue to call.

## 2016-08-30 NOTE — Telephone Encounter (Signed)
Left message for patient daughter asking her to return call to office just wanting to see how patient is doing since discharge.

## 2016-08-30 NOTE — Telephone Encounter (Signed)
Fourth attempt made for TCM left message for patient to return call to office.

## 2016-08-30 NOTE — Telephone Encounter (Signed)
It is okay to give verbal orders. 

## 2016-08-31 NOTE — Telephone Encounter (Signed)
Transition Care Management Follow-up Telephone Call  How have you been since you were released from the hospital? Patient is scared to be by himself and is having trouble being home alone.Patient daughter stated he is not doing well at home alone.   Do you understand why you were in the hospital? Yes   Do you understand the discharge instrcutions?yes  Items Reviewed:  Medications reviewed: yes  Allergies reviewed:yes  Dietary changes reviewed: yes  Referrals reviewed: {yes   Functional Questionnaire:   Activities of Daily Living (ADLs):   He states they are independent in the following: [facility] is in the home doing some skill nursing, and has been adding with bathing until this release from hospital , patient needs order for CNA to bathe in home. States they require assistance with the following: Bathing , dressing medications , preparing meals.   Any transportation issues/concerns?: No   Any patient concerns? Yes , patient trying to get into skill nursing or referral for help in the home.   Confirmed importance and date/time of follow-up visits scheduled: Yes   Confirmed with patient if condition begins to worsen call PCP or go to the ER.  Patient was given the Call-a-Nurse line [phone]: Yes

## 2016-09-01 DIAGNOSIS — G250 Essential tremor: Secondary | ICD-10-CM | POA: Diagnosis not present

## 2016-09-01 DIAGNOSIS — I2510 Atherosclerotic heart disease of native coronary artery without angina pectoris: Secondary | ICD-10-CM | POA: Diagnosis not present

## 2016-09-01 DIAGNOSIS — M6281 Muscle weakness (generalized): Secondary | ICD-10-CM | POA: Diagnosis not present

## 2016-09-01 DIAGNOSIS — I5020 Unspecified systolic (congestive) heart failure: Secondary | ICD-10-CM | POA: Diagnosis not present

## 2016-09-01 DIAGNOSIS — J449 Chronic obstructive pulmonary disease, unspecified: Secondary | ICD-10-CM | POA: Diagnosis not present

## 2016-09-01 DIAGNOSIS — I110 Hypertensive heart disease with heart failure: Secondary | ICD-10-CM | POA: Diagnosis not present

## 2016-09-01 DIAGNOSIS — E119 Type 2 diabetes mellitus without complications: Secondary | ICD-10-CM | POA: Diagnosis not present

## 2016-09-01 DIAGNOSIS — G40909 Epilepsy, unspecified, not intractable, without status epilepticus: Secondary | ICD-10-CM | POA: Diagnosis not present

## 2016-09-01 DIAGNOSIS — K8020 Calculus of gallbladder without cholecystitis without obstruction: Secondary | ICD-10-CM | POA: Diagnosis not present

## 2016-09-01 DIAGNOSIS — I480 Paroxysmal atrial fibrillation: Secondary | ICD-10-CM | POA: Diagnosis not present

## 2016-09-02 ENCOUNTER — Telehealth: Payer: Self-pay | Admitting: *Deleted

## 2016-09-02 DIAGNOSIS — K8020 Calculus of gallbladder without cholecystitis without obstruction: Secondary | ICD-10-CM | POA: Diagnosis not present

## 2016-09-02 DIAGNOSIS — G40909 Epilepsy, unspecified, not intractable, without status epilepticus: Secondary | ICD-10-CM | POA: Diagnosis not present

## 2016-09-02 DIAGNOSIS — I5020 Unspecified systolic (congestive) heart failure: Secondary | ICD-10-CM | POA: Diagnosis not present

## 2016-09-02 DIAGNOSIS — E119 Type 2 diabetes mellitus without complications: Secondary | ICD-10-CM | POA: Diagnosis not present

## 2016-09-02 DIAGNOSIS — I2510 Atherosclerotic heart disease of native coronary artery without angina pectoris: Secondary | ICD-10-CM | POA: Diagnosis not present

## 2016-09-02 DIAGNOSIS — G250 Essential tremor: Secondary | ICD-10-CM | POA: Diagnosis not present

## 2016-09-02 DIAGNOSIS — I110 Hypertensive heart disease with heart failure: Secondary | ICD-10-CM | POA: Diagnosis not present

## 2016-09-02 DIAGNOSIS — M6281 Muscle weakness (generalized): Secondary | ICD-10-CM | POA: Diagnosis not present

## 2016-09-02 DIAGNOSIS — J449 Chronic obstructive pulmonary disease, unspecified: Secondary | ICD-10-CM | POA: Diagnosis not present

## 2016-09-02 DIAGNOSIS — I480 Paroxysmal atrial fibrillation: Secondary | ICD-10-CM | POA: Diagnosis not present

## 2016-09-02 NOTE — Telephone Encounter (Signed)
Noted  

## 2016-09-02 NOTE — Telephone Encounter (Signed)
Maren Reamer from [facility] reported a missed physical therapy appt, due to pt not bing home.

## 2016-09-06 ENCOUNTER — Ambulatory Visit: Payer: Medicare HMO | Admitting: Family Medicine

## 2016-09-06 ENCOUNTER — Emergency Department
Admission: EM | Admit: 2016-09-06 | Discharge: 2016-09-06 | Disposition: A | Discharge status: Home / Self Care | Payer: Medicare HMO | Attending: Emergency Medicine | Admitting: Emergency Medicine

## 2016-09-06 ENCOUNTER — Encounter: Payer: Self-pay | Admitting: *Deleted

## 2016-09-06 DIAGNOSIS — G40909 Epilepsy, unspecified, not intractable, without status epilepticus: Secondary | ICD-10-CM | POA: Diagnosis not present

## 2016-09-06 DIAGNOSIS — I480 Paroxysmal atrial fibrillation: Secondary | ICD-10-CM | POA: Diagnosis not present

## 2016-09-06 DIAGNOSIS — Z79899 Other long term (current) drug therapy: Secondary | ICD-10-CM | POA: Diagnosis not present

## 2016-09-06 DIAGNOSIS — G250 Essential tremor: Secondary | ICD-10-CM | POA: Diagnosis not present

## 2016-09-06 DIAGNOSIS — Z8551 Personal history of malignant neoplasm of bladder: Secondary | ICD-10-CM | POA: Diagnosis not present

## 2016-09-06 DIAGNOSIS — J449 Chronic obstructive pulmonary disease, unspecified: Secondary | ICD-10-CM | POA: Diagnosis not present

## 2016-09-06 DIAGNOSIS — I110 Hypertensive heart disease with heart failure: Secondary | ICD-10-CM | POA: Diagnosis not present

## 2016-09-06 DIAGNOSIS — M6281 Muscle weakness (generalized): Secondary | ICD-10-CM | POA: Diagnosis not present

## 2016-09-06 DIAGNOSIS — R110 Nausea: Secondary | ICD-10-CM | POA: Diagnosis not present

## 2016-09-06 DIAGNOSIS — E119 Type 2 diabetes mellitus without complications: Secondary | ICD-10-CM | POA: Insufficient documentation

## 2016-09-06 DIAGNOSIS — I5020 Unspecified systolic (congestive) heart failure: Secondary | ICD-10-CM | POA: Diagnosis not present

## 2016-09-06 DIAGNOSIS — R112 Nausea with vomiting, unspecified: Secondary | ICD-10-CM | POA: Diagnosis not present

## 2016-09-06 DIAGNOSIS — K8020 Calculus of gallbladder without cholecystitis without obstruction: Secondary | ICD-10-CM | POA: Diagnosis not present

## 2016-09-06 DIAGNOSIS — R42 Dizziness and giddiness: Secondary | ICD-10-CM | POA: Insufficient documentation

## 2016-09-06 DIAGNOSIS — I2510 Atherosclerotic heart disease of native coronary artery without angina pectoris: Secondary | ICD-10-CM | POA: Diagnosis not present

## 2016-09-06 DIAGNOSIS — I119 Hypertensive heart disease without heart failure: Secondary | ICD-10-CM | POA: Diagnosis not present

## 2016-09-06 LAB — URINALYSIS, COMPLETE (UACMP) WITH MICROSCOPIC
BILIRUBIN URINE: NEGATIVE
Bacteria, UA: NONE SEEN
Glucose, UA: NEGATIVE mg/dL
HGB URINE DIPSTICK: NEGATIVE
Ketones, ur: 5 mg/dL — AB
Leukocytes, UA: NEGATIVE
NITRITE: NEGATIVE
PROTEIN: NEGATIVE mg/dL
SPECIFIC GRAVITY, URINE: 1.013 (ref 1.005–1.030)
pH: 5 (ref 5.0–8.0)

## 2016-09-06 LAB — CBC WITH DIFFERENTIAL/PLATELET
BASOS PCT: 1 %
Basophils Absolute: 0.1 10*3/uL (ref 0–0.1)
EOS ABS: 0.2 10*3/uL (ref 0–0.7)
Eosinophils Relative: 2 %
HCT: 37.9 % — ABNORMAL LOW (ref 40.0–52.0)
HEMOGLOBIN: 13.1 g/dL (ref 13.0–18.0)
Lymphocytes Relative: 16 %
Lymphs Abs: 1.4 10*3/uL (ref 1.0–3.6)
MCH: 34.7 pg — ABNORMAL HIGH (ref 26.0–34.0)
MCHC: 34.6 g/dL (ref 32.0–36.0)
MCV: 100.4 fL — ABNORMAL HIGH (ref 80.0–100.0)
Monocytes Absolute: 0.6 10*3/uL (ref 0.2–1.0)
Monocytes Relative: 7 %
NEUTROS PCT: 74 %
Neutro Abs: 6.3 10*3/uL (ref 1.4–6.5)
PLATELETS: 176 10*3/uL (ref 150–440)
RBC: 3.78 MIL/uL — ABNORMAL LOW (ref 4.40–5.90)
RDW: 13.2 % (ref 11.5–14.5)
WBC: 8.5 10*3/uL (ref 3.8–10.6)

## 2016-09-06 LAB — BASIC METABOLIC PANEL
Anion gap: 13 (ref 5–15)
BUN: 28 mg/dL — ABNORMAL HIGH (ref 6–20)
CALCIUM: 9.5 mg/dL (ref 8.9–10.3)
CO2: 22 mmol/L (ref 22–32)
CREATININE: 1.25 mg/dL — AB (ref 0.61–1.24)
Chloride: 103 mmol/L (ref 101–111)
GFR calc Af Amer: >60 mL/min (ref >60)
GFR, EST NON AFRICAN AMERICAN: 52 mL/min — AB (ref >60)
Glucose, Bld: 143 mg/dL — ABNORMAL HIGH (ref 65–99)
Potassium: 4.2 mmol/L (ref 3.5–5.1)
SODIUM: 138 mmol/L (ref 135–145)

## 2016-09-06 MED ORDER — SODIUM CHLORIDE 0.9 % IV BOLUS (SEPSIS)
500.0000 mL | Freq: Once | INTRAVENOUS | Status: AC
  Administered 2016-09-06: 500 mL via INTRAVENOUS

## 2016-09-06 MED ORDER — MECLIZINE HCL 25 MG PO TABS
25.0000 mg | ORAL_TABLET | Freq: Once | ORAL | Status: AC
  Administered 2016-09-06: 25 mg via ORAL
  Filled 2016-09-06: qty 1

## 2016-09-06 MED ORDER — ONDANSETRON HCL 4 MG/2ML IJ SOLN
4.0000 mg | Freq: Once | INTRAMUSCULAR | Status: AC
  Administered 2016-09-06: 4 mg via INTRAVENOUS
  Filled 2016-09-06: qty 2

## 2016-09-06 NOTE — ED Notes (Signed)
Pt able to stand with walker for 10 minutes while RN changed sheets and changed pt. Pt was not incontinent but spilled urinal in bed after having used it. Pt was able to stand without difficulty and reported having decreased dizziness while doing so. No nausea reported. MD made aware.

## 2016-09-06 NOTE — ED Notes (Signed)
Daughter arrived to ED. Pt dressed and taken to car via a wheelchair. PT able to stand using walker and transfer self from bed to wheelchair and wheelchair to car with only the use of a walker. Family updated on treatment and plan of care at home. Family verbalized understanding. Pt has all belongings at time of discharge and continued to verbalized decreased dizziness and feeling safe to go home.

## 2016-09-06 NOTE — ED Provider Notes (Signed)
[facility] Emergency Department Provider Note ____________________________________________   First MD Initiated Contact with Patient 09/06/16 1249     (approximate)  I have reviewed the triage vital signs and the nursing notes.   HISTORY  Chief Complaint Nausea and Dizziness    HPI Chase Simmons is a 83 y.o. male History of A. fib, COPD, hypertension, bladder cancer, vertigo, who presents with dizziness, unclear onset, persistent course, described as spinning or the room moving around him, associated with nausea and vomiting. Patient states that this feels exactly like the vertigo he has had on his previous ER visits in the last several weeks. Patient denies headache, chest pain or difficulty breathing, diarrhea, fever, or urinary symptoms. He is unsure of whether he took any medication for it today.     Past Medical History:  Diagnosis Date  . Allergy   . Atrial fibrillation ([place]) 08/30/2013  . Cancer [facility]) pt unsure of year   bladder cancer-Dr. Jacqlyn Larsen  . COPD (chronic obstructive pulmonary disease) ([place])   . Coronary atherosclerosis of native coronary artery 01/05/2016  . Hyperlipidemia   . Hypertension   . Tremor, essential    sees [facility] neurologist  . Type 2 diabetes mellitus with complication, without long-term current use of insulin ([place]) 08/01/2012  . Urinary incontinence     Patient Active Problem List   Diagnosis Date Noted  . Dizziness 08/25/2016  . Nausea 08/01/2016  . CHF (congestive heart failure) ([place]) 08/01/2016  . Atypical chest pain 07/20/2016  . Persistent atrial fibrillation ([place]) 07/10/2016  . COPD (chronic obstructive pulmonary disease) ([place]) 03/15/2016  . Coronary atherosclerosis of native coronary artery 01/05/2016  . Aortic atherosclerosis ([place]) 01/05/2016  . Bradycardia 01/05/2016  . Anxiety 06/17/2015  . Malignant neoplasm of urinary bladder ([place]) 06/17/2015  . Allergic rhinitis 05/14/2015  . PAF (paroxysmal atrial  fibrillation) ([place]) 08/30/2013  . Acid reflux 08/30/2013  . Tremor, essential 10/22/2012  . Hyperlipidemia 10/22/2012  . HTN (hypertension) 08/01/2012  . Type 2 diabetes mellitus with complication, without long-term current use of insulin ([place]) 08/01/2012  . Benign prostatic hyperplasia with urinary obstruction 09/02/2011    Past Surgical History:  Procedure Laterality Date  . bladder cancer    . CATARACT EXTRACTION    . HERNIA REPAIR    . LEFT HEART CATH AND CORONARY ANGIOGRAPHY N/A 07/11/2016   Procedure: Left Heart Cath and Coronary Angiography;  Surgeon: Wellington Hampshire, MD;  Location: [facility];  Service: Cardiovascular;  Laterality: N/A;    Prior to Admission medications   Medication Sig Start Date End Date Taking? Authorizing Provider  acetaminophen (TYLENOL) 325 MG tablet Take 2 tablets (650 mg total) by mouth every 6 (six) hours as needed for mild pain (or Fever >/= 101). 06/22/15   Nicholes Mango, MD  ALPRAZolam Duanne Moron) 0.25 MG tablet take 1 tablet by mouth every 12 hours if needed for anxiety 07/25/16   Vaughan Basta, MD  Alum & Mag Hydroxide-Simeth (GI COCKTAIL) SUSP suspension Take 30 mLs by mouth 4 (four) times daily as needed for indigestion (or chest pain). Shake well. 07/22/16   Gladstone Lighter, MD  amLODipine (NORVASC) 5 MG tablet Take 5 mg by mouth daily. 07/26/16   [provider]  aspirin EC 81 MG tablet Take 1 tablet (81 mg total) by mouth daily. 03/15/16   Coral Spikes, DO  atorvastatin (LIPITOR) 80 MG tablet TAKE 1 TABLET ONE TIME DAILY 11/04/15   Coral Spikes, DO  blood  glucose meter kit and supplies KIT Dispense based on patient and insurance preference. Use up to four times daily as directed. 08/01/16   Coral Spikes, DO  fexofenadine (ALLEGRA) 180 MG tablet Take 180 mg by mouth daily as needed for allergies or rhinitis.    [provider]  glimepiride (AMARYL) 4 MG tablet take 1 tablet by mouth WITH BREAKFAST. 07/05/16   Thersa Salt G, DO  ipratropium-albuterol (DUONEB) 0.5-2.5 (3) MG/3ML SOLN Take 3 mLs by nebulization every 6 (six) hours as needed. 06/08/15   Dustin Flock, MD  lisinopril (PRINIVIL,ZESTRIL) 20 MG tablet Take 1 tablet (20 mg total) by mouth daily. 07/26/16   Vaughan Basta, MD  meclizine (ANTIVERT) 12.5 MG tablet Take 1 tablet (12.5 mg total) by mouth 3 (three) times daily as needed for dizziness. 08/26/16   Fritzi Mandes, MD  metFORMIN (GLUCOPHAGE) 1000 MG tablet Take 1,000 mg by mouth 2 (two) times daily with a meal.    [provider]  pantoprazole (PROTONIX) 40 MG tablet Take 1 tablet (40 mg total) by mouth daily. 07/23/16   Gladstone Lighter, MD  PARoxetine (PAXIL) 40 MG tablet take 1 tablet by mouth once daily 05/23/16   Coral Spikes, DO  primidone (MYSOLINE) 50 MG tablet Take 1.5 tablets (75 mg total) by mouth 2 (two) times daily. 01/29/16   Coral Spikes, DO  tiotropium (SPIRIVA) 18 MCG inhalation capsule Place 18 mcg into inhaler and inhale daily.    [provider]    Allergies Latex  Family History  Problem Relation Age of Onset  . Arthritis Mother   . Dementia Mother   . Cancer Maternal Uncle        prostate  . Cancer Maternal Aunt        breast cancer    Social History Social History  Substance Use Topics  . Smoking status: Former Smoker    Packs/day: 1.50    Years: 40.00  . Smokeless tobacco: Never Used  . Alcohol use 7.2 oz/week    12 Standard drinks or equivalent per week     Comment: 1 drink with dinner    Review of Systems  Constitutional: No fever/chills Eyes: No visual changes. ENT: No sore throat. Cardiovascular: Denies chest pain. Respiratory: Denies shortness of breath. Gastrointestinal: Positive for nausea, no vomiting.  No diarrhea.  Genitourinary: Negative for dysuria.  Musculoskeletal: Negative for back pain. Skin: Negative for rash. Neurological: Negative for headaches, focal weakness or numbness.  Positive for vertigo.     ____________________________________________   PHYSICAL EXAM:  VITAL SIGNS: ED Triage Vitals  Enc Vitals Group     BP 09/06/16 1252 (!) 156/88     Pulse Rate 09/06/16 1252 (!) 55     Resp 09/06/16 1252 (!) 22     Temp 09/06/16 1252 99.4 F (37.4 C)     Temp Source 09/06/16 1252 Oral     SpO2 09/06/16 1252 100 %     Weight 09/06/16 1253 247 lb (112 kg)     Height 09/06/16 1253 '5\' 11"'  (1.803 m)     Head Circumference --      Peak Flow --      Pain Score --      Pain Loc --      Pain Edu? --      Excl. in [place]? --     Constitutional: Alert and oriented. Slightly uncomfortable appearing. Eyes: Conjunctivae are normal. EOMI.  PERRLA.  Head: Atraumatic. Nose: No congestion/rhinnorhea.  Mouth/Throat: Mucous membranes are dry.  Neck: Normal range of motion.  Cardiovascular: Normal rate, regular rhythm. Grossly normal heart sounds.  Good peripheral circulation. Respiratory: Normal respiratory effort.  No retractions. Lungs CTAB. Gastrointestinal: Soft and nontender. No distention.  Genitourinary: No CVA tenderness. Musculoskeletal: No lower extremity edema.  Extremities warm and well perfused.  Neurologic:  Normal speech and language. Motor intact in all extremities, normal coordination.  Horizontal nystagmus.  Cns III-XII intact. Skin:  Skin is warm and dry. No rash noted. Psychiatric: Mood and affect are normal. Speech and behavior are normal.  ____________________________________________   LABS (all labs ordered are listed, but only abnormal results are displayed)  Labs Reviewed  BASIC METABOLIC PANEL - Abnormal; Notable for the following:       Result Value   Glucose, Bld 143 (*)    BUN 28 (*)    Creatinine, Ser 1.25 (*)    GFR calc non Af Amer 52 (*)    All other components within normal limits  CBC WITH DIFFERENTIAL/PLATELET - Abnormal; Notable for the following:    RBC 3.78 (*)    HCT 37.9 (*)    MCV 100.4 (*)    MCH 34.7 (*)    All other components within  normal limits  URINALYSIS, COMPLETE (UACMP) WITH MICROSCOPIC - Abnormal; Notable for the following:    Color, Urine YELLOW (*)    APPearance CLEAR (*)    Ketones, ur 5 (*)    Squamous Epithelial / LPF 0-5 (*)    All other components within normal limits   ____________________________________________  EKG  ED ECG REPORT I, Arta Silence, the attending physician, personally viewed and interpreted this ECG.  Date: 09/06/2016 EKG Time: 1253 Rate: 66 Rhythm:  sinus rhythm, PVCs QRS Axis: normal Intervals: LBBB ST/T Wave abnormalities: normal Narrative Interpretation: no evidence of acute ischemia, no significant change from EKG of 07/10/16  ____________________________________________  RADIOLOGY    ____________________________________________   PROCEDURES  Procedure(s) performed: No    Critical Care performed: No ____________________________________________   INITIAL IMPRESSION / ASSESSMENT AND PLAN / ED COURSE  Pertinent labs & imaging results that were available during my care of the patient were reviewed by me and considered in my medical decision making (see chart for details).  83 year old male past medical history as noted presents with dizziness similar to his prior episodes of vertigo since earlier today. Patient reports a movement or spinning sensation associated with nausea and vomiting. On exam, patient slightly uncomfortable but not acutely ill-appearing, vital signs are normal except for borderline temperature, and neuro exam is nonfocal except for nystagmus. Patient somewhat poor historian - is not sure if he took anything for this today, however he is A & O 3 and answering all questions appropriately. Overall suspect recurrence of his chronic peripheral vertigo. Patient had multiple visits for the same symptoms in the last month and had a negative CT head on 8/21. No evidence to suggest CVA or other acute CNS cause given pt's report of the same sx as  before, and no concerning neuro findings.  Plan: Basic labs, fluid bolus, symptomatic treatment for the vertigo, and reassess. If patient is unable to ambulate or symptoms persist may require readmission.    ----------------------------------------- 4:14 PM on 09/06/2016 -----------------------------------------  Lab workup is unremarkable and a UA is negative. Patient remains stable with normal vital signs. He states that he he feels much better, the dizziness is almost completely resolved and he was able to stand and walk. He  states he feels well to go home. he states he lives alone but has a nurse who comes in helps him several times a week and he is able to call his family members at any time to come help him. RN is contacting family to discuss patient's discharge. Patient states he feels safe at home. Will discharge. Return precautions given, and I also discussed with pt that he should be followed by a neurologist given these ongoing recurrent episodes.  He requested a referral to neurologist at [facility].   ____________________________________________   FINAL CLINICAL IMPRESSION(S) / ED DIAGNOSES  Final diagnoses:  Vertigo      NEW MEDICATIONS STARTED DURING THIS VISIT:  Discharge Medication List as of 09/06/2016  4:21 PM       Note:  This document was prepared using Dragon voice recognition software and may include unintentional dictation errors.    Arta Silence, MD 09/06/16 2120

## 2016-09-06 NOTE — ED Notes (Signed)
Family of pt was called to request family to take pt home. Son unable to come at this time but daughter reports she will come to ED as soon as possible.

## 2016-09-06 NOTE — ED Triage Notes (Signed)
Pt to ED via EMS from home reporting nausea and dizziness. PT has been to ED multiple times for similar dizziness complaint and reports the dizziness feels the same as the past times. Pt reports living at home alone. EMS reports pt was unable to pivot and walk upon their arrival. No reports of falls. Pt denies abd pain, NVD or headache at this time.    Pt is alert to place and self. Disoriented to time.

## 2016-09-06 NOTE — Discharge Instructions (Signed)
Follow-up with your primary care doctor. It is also recommended that he follow up with a neurologist within the next 1-2 weeks, especially given the recurrent episodes of dizziness.  A referral has been provided but if you have already been seen by another neurologist, you can go to whoever you prefer.  Return to the ER for any new or worsening dizziness or any other new or worsening symptoms that concern you. Take your medications as prescribed.

## 2016-09-07 DIAGNOSIS — I110 Hypertensive heart disease with heart failure: Secondary | ICD-10-CM | POA: Diagnosis not present

## 2016-09-07 DIAGNOSIS — I2510 Atherosclerotic heart disease of native coronary artery without angina pectoris: Secondary | ICD-10-CM | POA: Diagnosis not present

## 2016-09-07 DIAGNOSIS — E119 Type 2 diabetes mellitus without complications: Secondary | ICD-10-CM | POA: Diagnosis not present

## 2016-09-07 DIAGNOSIS — J449 Chronic obstructive pulmonary disease, unspecified: Secondary | ICD-10-CM | POA: Diagnosis not present

## 2016-09-07 DIAGNOSIS — G250 Essential tremor: Secondary | ICD-10-CM | POA: Diagnosis not present

## 2016-09-07 DIAGNOSIS — G40909 Epilepsy, unspecified, not intractable, without status epilepticus: Secondary | ICD-10-CM | POA: Diagnosis not present

## 2016-09-07 DIAGNOSIS — K8020 Calculus of gallbladder without cholecystitis without obstruction: Secondary | ICD-10-CM | POA: Diagnosis not present

## 2016-09-07 DIAGNOSIS — I5020 Unspecified systolic (congestive) heart failure: Secondary | ICD-10-CM | POA: Diagnosis not present

## 2016-09-07 DIAGNOSIS — M6281 Muscle weakness (generalized): Secondary | ICD-10-CM | POA: Diagnosis not present

## 2016-09-07 DIAGNOSIS — I480 Paroxysmal atrial fibrillation: Secondary | ICD-10-CM | POA: Diagnosis not present

## 2016-09-08 DIAGNOSIS — K8020 Calculus of gallbladder without cholecystitis without obstruction: Secondary | ICD-10-CM | POA: Diagnosis not present

## 2016-09-08 DIAGNOSIS — J449 Chronic obstructive pulmonary disease, unspecified: Secondary | ICD-10-CM | POA: Diagnosis not present

## 2016-09-08 DIAGNOSIS — G250 Essential tremor: Secondary | ICD-10-CM | POA: Diagnosis not present

## 2016-09-08 DIAGNOSIS — I480 Paroxysmal atrial fibrillation: Secondary | ICD-10-CM | POA: Diagnosis not present

## 2016-09-08 DIAGNOSIS — I2510 Atherosclerotic heart disease of native coronary artery without angina pectoris: Secondary | ICD-10-CM | POA: Diagnosis not present

## 2016-09-08 DIAGNOSIS — I110 Hypertensive heart disease with heart failure: Secondary | ICD-10-CM | POA: Diagnosis not present

## 2016-09-08 DIAGNOSIS — G40909 Epilepsy, unspecified, not intractable, without status epilepticus: Secondary | ICD-10-CM | POA: Diagnosis not present

## 2016-09-08 DIAGNOSIS — E119 Type 2 diabetes mellitus without complications: Secondary | ICD-10-CM | POA: Diagnosis not present

## 2016-09-08 DIAGNOSIS — I5020 Unspecified systolic (congestive) heart failure: Secondary | ICD-10-CM | POA: Diagnosis not present

## 2016-09-08 DIAGNOSIS — M6281 Muscle weakness (generalized): Secondary | ICD-10-CM | POA: Diagnosis not present

## 2016-09-09 DIAGNOSIS — G40909 Epilepsy, unspecified, not intractable, without status epilepticus: Secondary | ICD-10-CM | POA: Diagnosis not present

## 2016-09-09 DIAGNOSIS — J449 Chronic obstructive pulmonary disease, unspecified: Secondary | ICD-10-CM | POA: Diagnosis not present

## 2016-09-09 DIAGNOSIS — I2510 Atherosclerotic heart disease of native coronary artery without angina pectoris: Secondary | ICD-10-CM | POA: Diagnosis not present

## 2016-09-09 DIAGNOSIS — M6281 Muscle weakness (generalized): Secondary | ICD-10-CM | POA: Diagnosis not present

## 2016-09-09 DIAGNOSIS — I5020 Unspecified systolic (congestive) heart failure: Secondary | ICD-10-CM | POA: Diagnosis not present

## 2016-09-09 DIAGNOSIS — I480 Paroxysmal atrial fibrillation: Secondary | ICD-10-CM | POA: Diagnosis not present

## 2016-09-09 DIAGNOSIS — E119 Type 2 diabetes mellitus without complications: Secondary | ICD-10-CM | POA: Diagnosis not present

## 2016-09-09 DIAGNOSIS — K8020 Calculus of gallbladder without cholecystitis without obstruction: Secondary | ICD-10-CM | POA: Diagnosis not present

## 2016-09-09 DIAGNOSIS — I110 Hypertensive heart disease with heart failure: Secondary | ICD-10-CM | POA: Diagnosis not present

## 2016-09-09 DIAGNOSIS — G250 Essential tremor: Secondary | ICD-10-CM | POA: Diagnosis not present

## 2016-09-13 DIAGNOSIS — M6281 Muscle weakness (generalized): Secondary | ICD-10-CM | POA: Diagnosis not present

## 2016-09-13 DIAGNOSIS — J449 Chronic obstructive pulmonary disease, unspecified: Secondary | ICD-10-CM | POA: Diagnosis not present

## 2016-09-13 DIAGNOSIS — I5020 Unspecified systolic (congestive) heart failure: Secondary | ICD-10-CM | POA: Diagnosis not present

## 2016-09-13 DIAGNOSIS — G40909 Epilepsy, unspecified, not intractable, without status epilepticus: Secondary | ICD-10-CM | POA: Diagnosis not present

## 2016-09-13 DIAGNOSIS — E119 Type 2 diabetes mellitus without complications: Secondary | ICD-10-CM | POA: Diagnosis not present

## 2016-09-13 DIAGNOSIS — I480 Paroxysmal atrial fibrillation: Secondary | ICD-10-CM | POA: Diagnosis not present

## 2016-09-13 DIAGNOSIS — I2510 Atherosclerotic heart disease of native coronary artery without angina pectoris: Secondary | ICD-10-CM | POA: Diagnosis not present

## 2016-09-13 DIAGNOSIS — G250 Essential tremor: Secondary | ICD-10-CM | POA: Diagnosis not present

## 2016-09-13 DIAGNOSIS — I110 Hypertensive heart disease with heart failure: Secondary | ICD-10-CM | POA: Diagnosis not present

## 2016-09-13 DIAGNOSIS — K8020 Calculus of gallbladder without cholecystitis without obstruction: Secondary | ICD-10-CM | POA: Diagnosis not present

## 2016-09-14 DIAGNOSIS — K8020 Calculus of gallbladder without cholecystitis without obstruction: Secondary | ICD-10-CM | POA: Diagnosis not present

## 2016-09-14 DIAGNOSIS — E119 Type 2 diabetes mellitus without complications: Secondary | ICD-10-CM | POA: Diagnosis not present

## 2016-09-14 DIAGNOSIS — I2510 Atherosclerotic heart disease of native coronary artery without angina pectoris: Secondary | ICD-10-CM | POA: Diagnosis not present

## 2016-09-14 DIAGNOSIS — G250 Essential tremor: Secondary | ICD-10-CM | POA: Diagnosis not present

## 2016-09-14 DIAGNOSIS — I5020 Unspecified systolic (congestive) heart failure: Secondary | ICD-10-CM | POA: Diagnosis not present

## 2016-09-14 DIAGNOSIS — J449 Chronic obstructive pulmonary disease, unspecified: Secondary | ICD-10-CM | POA: Diagnosis not present

## 2016-09-14 DIAGNOSIS — I110 Hypertensive heart disease with heart failure: Secondary | ICD-10-CM | POA: Diagnosis not present

## 2016-09-14 DIAGNOSIS — I480 Paroxysmal atrial fibrillation: Secondary | ICD-10-CM | POA: Diagnosis not present

## 2016-09-14 DIAGNOSIS — G40909 Epilepsy, unspecified, not intractable, without status epilepticus: Secondary | ICD-10-CM | POA: Diagnosis not present

## 2016-09-14 DIAGNOSIS — M6281 Muscle weakness (generalized): Secondary | ICD-10-CM | POA: Diagnosis not present

## 2016-09-15 ENCOUNTER — Telehealth: Payer: Self-pay | Admitting: *Deleted

## 2016-09-15 DIAGNOSIS — J449 Chronic obstructive pulmonary disease, unspecified: Secondary | ICD-10-CM | POA: Diagnosis not present

## 2016-09-15 DIAGNOSIS — G40909 Epilepsy, unspecified, not intractable, without status epilepticus: Secondary | ICD-10-CM | POA: Diagnosis not present

## 2016-09-15 DIAGNOSIS — I5020 Unspecified systolic (congestive) heart failure: Secondary | ICD-10-CM | POA: Diagnosis not present

## 2016-09-15 DIAGNOSIS — E119 Type 2 diabetes mellitus without complications: Secondary | ICD-10-CM | POA: Diagnosis not present

## 2016-09-15 DIAGNOSIS — I110 Hypertensive heart disease with heart failure: Secondary | ICD-10-CM | POA: Diagnosis not present

## 2016-09-15 DIAGNOSIS — G250 Essential tremor: Secondary | ICD-10-CM | POA: Diagnosis not present

## 2016-09-15 DIAGNOSIS — I2510 Atherosclerotic heart disease of native coronary artery without angina pectoris: Secondary | ICD-10-CM | POA: Diagnosis not present

## 2016-09-15 DIAGNOSIS — M6281 Muscle weakness (generalized): Secondary | ICD-10-CM | POA: Diagnosis not present

## 2016-09-15 DIAGNOSIS — K8020 Calculus of gallbladder without cholecystitis without obstruction: Secondary | ICD-10-CM | POA: Diagnosis not present

## 2016-09-15 DIAGNOSIS — I480 Paroxysmal atrial fibrillation: Secondary | ICD-10-CM | POA: Diagnosis not present

## 2016-09-15 MED ORDER — PRIMIDONE 50 MG PO TABS
75.0000 mg | ORAL_TABLET | Freq: Two times a day (BID) | ORAL | 1 refills | Status: DC
Start: 2016-09-15 — End: 2017-03-17

## 2016-09-15 MED ORDER — LISINOPRIL 20 MG PO TABS: 20.0000 mg | ORAL_TABLET | Freq: Every day | ORAL | 0 refills | Status: DC

## 2016-09-15 NOTE — Telephone Encounter (Signed)
Pt will a medication refill for lisinopril 20 mg and primidone  Pharmacy [facility]

## 2016-09-15 NOTE — Telephone Encounter (Signed)
Script sent to [facility].

## 2016-09-16 ENCOUNTER — Other Ambulatory Visit: Payer: Self-pay

## 2016-09-16 MED ORDER — LISINOPRIL 20 MG PO TABS: 20.0000 mg | ORAL_TABLET | Freq: Every day | ORAL | 0 refills | Status: DC

## 2016-09-19 DIAGNOSIS — I5020 Unspecified systolic (congestive) heart failure: Secondary | ICD-10-CM | POA: Diagnosis not present

## 2016-09-19 DIAGNOSIS — J449 Chronic obstructive pulmonary disease, unspecified: Secondary | ICD-10-CM | POA: Diagnosis not present

## 2016-09-19 DIAGNOSIS — I480 Paroxysmal atrial fibrillation: Secondary | ICD-10-CM | POA: Diagnosis not present

## 2016-09-19 DIAGNOSIS — I110 Hypertensive heart disease with heart failure: Secondary | ICD-10-CM | POA: Diagnosis not present

## 2016-09-19 DIAGNOSIS — M6281 Muscle weakness (generalized): Secondary | ICD-10-CM | POA: Diagnosis not present

## 2016-09-19 DIAGNOSIS — G250 Essential tremor: Secondary | ICD-10-CM | POA: Diagnosis not present

## 2016-09-19 DIAGNOSIS — E119 Type 2 diabetes mellitus without complications: Secondary | ICD-10-CM | POA: Diagnosis not present

## 2016-09-19 DIAGNOSIS — I2510 Atherosclerotic heart disease of native coronary artery without angina pectoris: Secondary | ICD-10-CM | POA: Diagnosis not present

## 2016-09-19 DIAGNOSIS — K8020 Calculus of gallbladder without cholecystitis without obstruction: Secondary | ICD-10-CM | POA: Diagnosis not present

## 2016-09-19 DIAGNOSIS — G40909 Epilepsy, unspecified, not intractable, without status epilepticus: Secondary | ICD-10-CM | POA: Diagnosis not present

## 2016-09-21 DIAGNOSIS — I110 Hypertensive heart disease with heart failure: Secondary | ICD-10-CM | POA: Diagnosis not present

## 2016-09-21 DIAGNOSIS — E119 Type 2 diabetes mellitus without complications: Secondary | ICD-10-CM | POA: Diagnosis not present

## 2016-09-21 DIAGNOSIS — G250 Essential tremor: Secondary | ICD-10-CM | POA: Diagnosis not present

## 2016-09-21 DIAGNOSIS — K8020 Calculus of gallbladder without cholecystitis without obstruction: Secondary | ICD-10-CM | POA: Diagnosis not present

## 2016-09-21 DIAGNOSIS — I2510 Atherosclerotic heart disease of native coronary artery without angina pectoris: Secondary | ICD-10-CM | POA: Diagnosis not present

## 2016-09-21 DIAGNOSIS — J449 Chronic obstructive pulmonary disease, unspecified: Secondary | ICD-10-CM | POA: Diagnosis not present

## 2016-09-21 DIAGNOSIS — G40909 Epilepsy, unspecified, not intractable, without status epilepticus: Secondary | ICD-10-CM | POA: Diagnosis not present

## 2016-09-21 DIAGNOSIS — I5020 Unspecified systolic (congestive) heart failure: Secondary | ICD-10-CM | POA: Diagnosis not present

## 2016-09-21 DIAGNOSIS — I480 Paroxysmal atrial fibrillation: Secondary | ICD-10-CM | POA: Diagnosis not present

## 2016-09-21 DIAGNOSIS — M6281 Muscle weakness (generalized): Secondary | ICD-10-CM | POA: Diagnosis not present

## 2016-09-22 ENCOUNTER — Ambulatory Visit: Payer: Medicare HMO | Admitting: Family Medicine

## 2016-09-23 DIAGNOSIS — K8020 Calculus of gallbladder without cholecystitis without obstruction: Secondary | ICD-10-CM | POA: Diagnosis not present

## 2016-09-23 DIAGNOSIS — I480 Paroxysmal atrial fibrillation: Secondary | ICD-10-CM | POA: Diagnosis not present

## 2016-09-23 DIAGNOSIS — G40909 Epilepsy, unspecified, not intractable, without status epilepticus: Secondary | ICD-10-CM | POA: Diagnosis not present

## 2016-09-23 DIAGNOSIS — E119 Type 2 diabetes mellitus without complications: Secondary | ICD-10-CM | POA: Diagnosis not present

## 2016-09-23 DIAGNOSIS — M6281 Muscle weakness (generalized): Secondary | ICD-10-CM | POA: Diagnosis not present

## 2016-09-23 DIAGNOSIS — I110 Hypertensive heart disease with heart failure: Secondary | ICD-10-CM | POA: Diagnosis not present

## 2016-09-23 DIAGNOSIS — I2510 Atherosclerotic heart disease of native coronary artery without angina pectoris: Secondary | ICD-10-CM | POA: Diagnosis not present

## 2016-09-23 DIAGNOSIS — G250 Essential tremor: Secondary | ICD-10-CM | POA: Diagnosis not present

## 2016-09-23 DIAGNOSIS — I5020 Unspecified systolic (congestive) heart failure: Secondary | ICD-10-CM | POA: Diagnosis not present

## 2016-09-23 DIAGNOSIS — J449 Chronic obstructive pulmonary disease, unspecified: Secondary | ICD-10-CM | POA: Diagnosis not present

## 2016-09-26 DIAGNOSIS — I110 Hypertensive heart disease with heart failure: Secondary | ICD-10-CM | POA: Diagnosis not present

## 2016-09-26 DIAGNOSIS — I2510 Atherosclerotic heart disease of native coronary artery without angina pectoris: Secondary | ICD-10-CM | POA: Diagnosis not present

## 2016-09-26 DIAGNOSIS — I480 Paroxysmal atrial fibrillation: Secondary | ICD-10-CM | POA: Diagnosis not present

## 2016-09-26 DIAGNOSIS — G250 Essential tremor: Secondary | ICD-10-CM | POA: Diagnosis not present

## 2016-09-26 DIAGNOSIS — J449 Chronic obstructive pulmonary disease, unspecified: Secondary | ICD-10-CM | POA: Diagnosis not present

## 2016-09-26 DIAGNOSIS — I5020 Unspecified systolic (congestive) heart failure: Secondary | ICD-10-CM | POA: Diagnosis not present

## 2016-09-26 DIAGNOSIS — K8020 Calculus of gallbladder without cholecystitis without obstruction: Secondary | ICD-10-CM | POA: Diagnosis not present

## 2016-09-26 DIAGNOSIS — E119 Type 2 diabetes mellitus without complications: Secondary | ICD-10-CM | POA: Diagnosis not present

## 2016-09-26 DIAGNOSIS — G40909 Epilepsy, unspecified, not intractable, without status epilepticus: Secondary | ICD-10-CM | POA: Diagnosis not present

## 2016-09-26 DIAGNOSIS — M6281 Muscle weakness (generalized): Secondary | ICD-10-CM | POA: Diagnosis not present

## 2016-09-28 DIAGNOSIS — I5020 Unspecified systolic (congestive) heart failure: Secondary | ICD-10-CM | POA: Diagnosis not present

## 2016-09-28 DIAGNOSIS — M6281 Muscle weakness (generalized): Secondary | ICD-10-CM | POA: Diagnosis not present

## 2016-09-28 DIAGNOSIS — G250 Essential tremor: Secondary | ICD-10-CM | POA: Diagnosis not present

## 2016-09-28 DIAGNOSIS — K8020 Calculus of gallbladder without cholecystitis without obstruction: Secondary | ICD-10-CM | POA: Diagnosis not present

## 2016-09-28 DIAGNOSIS — I480 Paroxysmal atrial fibrillation: Secondary | ICD-10-CM | POA: Diagnosis not present

## 2016-09-28 DIAGNOSIS — I110 Hypertensive heart disease with heart failure: Secondary | ICD-10-CM | POA: Diagnosis not present

## 2016-09-28 DIAGNOSIS — I2510 Atherosclerotic heart disease of native coronary artery without angina pectoris: Secondary | ICD-10-CM | POA: Diagnosis not present

## 2016-09-28 DIAGNOSIS — G40909 Epilepsy, unspecified, not intractable, without status epilepticus: Secondary | ICD-10-CM | POA: Diagnosis not present

## 2016-09-28 DIAGNOSIS — E119 Type 2 diabetes mellitus without complications: Secondary | ICD-10-CM | POA: Diagnosis not present

## 2016-09-28 DIAGNOSIS — J449 Chronic obstructive pulmonary disease, unspecified: Secondary | ICD-10-CM | POA: Diagnosis not present

## 2016-09-29 ENCOUNTER — Ambulatory Visit: Payer: Medicare HMO | Admitting: Internal Medicine

## 2016-09-29 DIAGNOSIS — I110 Hypertensive heart disease with heart failure: Secondary | ICD-10-CM | POA: Diagnosis not present

## 2016-09-29 DIAGNOSIS — I480 Paroxysmal atrial fibrillation: Secondary | ICD-10-CM | POA: Diagnosis not present

## 2016-09-29 DIAGNOSIS — M6281 Muscle weakness (generalized): Secondary | ICD-10-CM | POA: Diagnosis not present

## 2016-09-29 DIAGNOSIS — M25562 Pain in left knee: Secondary | ICD-10-CM | POA: Diagnosis not present

## 2016-09-29 DIAGNOSIS — G250 Essential tremor: Secondary | ICD-10-CM | POA: Diagnosis not present

## 2016-09-29 DIAGNOSIS — I5020 Unspecified systolic (congestive) heart failure: Secondary | ICD-10-CM | POA: Diagnosis not present

## 2016-09-29 DIAGNOSIS — G40909 Epilepsy, unspecified, not intractable, without status epilepticus: Secondary | ICD-10-CM | POA: Diagnosis not present

## 2016-09-29 DIAGNOSIS — E119 Type 2 diabetes mellitus without complications: Secondary | ICD-10-CM | POA: Diagnosis not present

## 2016-09-29 DIAGNOSIS — J449 Chronic obstructive pulmonary disease, unspecified: Secondary | ICD-10-CM | POA: Diagnosis not present

## 2016-09-29 DIAGNOSIS — M25511 Pain in right shoulder: Secondary | ICD-10-CM | POA: Diagnosis not present

## 2016-09-30 ENCOUNTER — Telehealth: Payer: Self-pay | Admitting: *Deleted

## 2016-09-30 DIAGNOSIS — G250 Essential tremor: Secondary | ICD-10-CM | POA: Diagnosis not present

## 2016-09-30 DIAGNOSIS — I5020 Unspecified systolic (congestive) heart failure: Secondary | ICD-10-CM | POA: Diagnosis not present

## 2016-09-30 DIAGNOSIS — M25511 Pain in right shoulder: Secondary | ICD-10-CM | POA: Diagnosis not present

## 2016-09-30 DIAGNOSIS — I480 Paroxysmal atrial fibrillation: Secondary | ICD-10-CM | POA: Diagnosis not present

## 2016-09-30 DIAGNOSIS — G40909 Epilepsy, unspecified, not intractable, without status epilepticus: Secondary | ICD-10-CM | POA: Diagnosis not present

## 2016-09-30 DIAGNOSIS — J449 Chronic obstructive pulmonary disease, unspecified: Secondary | ICD-10-CM | POA: Diagnosis not present

## 2016-09-30 DIAGNOSIS — M25562 Pain in left knee: Secondary | ICD-10-CM | POA: Diagnosis not present

## 2016-09-30 DIAGNOSIS — I110 Hypertensive heart disease with heart failure: Secondary | ICD-10-CM | POA: Diagnosis not present

## 2016-09-30 DIAGNOSIS — M6281 Muscle weakness (generalized): Secondary | ICD-10-CM | POA: Diagnosis not present

## 2016-09-30 DIAGNOSIS — E119 Type 2 diabetes mellitus without complications: Secondary | ICD-10-CM | POA: Diagnosis not present

## 2016-09-30 NOTE — Telephone Encounter (Signed)
[name] form [facility] has requested continued care orders for nursing with a frequency on 1 time a week Contact [phone]

## 2016-10-01 ENCOUNTER — Other Ambulatory Visit: Payer: Self-pay | Admitting: Family Medicine

## 2016-10-02 NOTE — Telephone Encounter (Signed)
Verbal orders can be given. 

## 2016-10-03 NOTE — Telephone Encounter (Signed)
Left voicemail for [facility]  giving orders as listed below

## 2016-10-05 DIAGNOSIS — G40909 Epilepsy, unspecified, not intractable, without status epilepticus: Secondary | ICD-10-CM | POA: Diagnosis not present

## 2016-10-05 DIAGNOSIS — I2510 Atherosclerotic heart disease of native coronary artery without angina pectoris: Secondary | ICD-10-CM | POA: Diagnosis not present

## 2016-10-05 DIAGNOSIS — J449 Chronic obstructive pulmonary disease, unspecified: Secondary | ICD-10-CM | POA: Diagnosis not present

## 2016-10-05 DIAGNOSIS — I5020 Unspecified systolic (congestive) heart failure: Secondary | ICD-10-CM | POA: Diagnosis not present

## 2016-10-05 DIAGNOSIS — I110 Hypertensive heart disease with heart failure: Secondary | ICD-10-CM | POA: Diagnosis not present

## 2016-10-05 DIAGNOSIS — K8020 Calculus of gallbladder without cholecystitis without obstruction: Secondary | ICD-10-CM | POA: Diagnosis not present

## 2016-10-05 DIAGNOSIS — G250 Essential tremor: Secondary | ICD-10-CM | POA: Diagnosis not present

## 2016-10-05 DIAGNOSIS — M6281 Muscle weakness (generalized): Secondary | ICD-10-CM | POA: Diagnosis not present

## 2016-10-05 DIAGNOSIS — E119 Type 2 diabetes mellitus without complications: Secondary | ICD-10-CM | POA: Diagnosis not present

## 2016-10-05 DIAGNOSIS — I480 Paroxysmal atrial fibrillation: Secondary | ICD-10-CM | POA: Diagnosis not present

## 2016-10-07 ENCOUNTER — Telehealth: Payer: Self-pay | Admitting: Family Medicine

## 2016-10-07 DIAGNOSIS — G40909 Epilepsy, unspecified, not intractable, without status epilepticus: Secondary | ICD-10-CM | POA: Diagnosis not present

## 2016-10-07 DIAGNOSIS — I2510 Atherosclerotic heart disease of native coronary artery without angina pectoris: Secondary | ICD-10-CM | POA: Diagnosis not present

## 2016-10-07 DIAGNOSIS — E119 Type 2 diabetes mellitus without complications: Secondary | ICD-10-CM | POA: Diagnosis not present

## 2016-10-07 DIAGNOSIS — I480 Paroxysmal atrial fibrillation: Secondary | ICD-10-CM | POA: Diagnosis not present

## 2016-10-07 DIAGNOSIS — M6281 Muscle weakness (generalized): Secondary | ICD-10-CM | POA: Diagnosis not present

## 2016-10-07 DIAGNOSIS — I110 Hypertensive heart disease with heart failure: Secondary | ICD-10-CM | POA: Diagnosis not present

## 2016-10-07 DIAGNOSIS — J449 Chronic obstructive pulmonary disease, unspecified: Secondary | ICD-10-CM | POA: Diagnosis not present

## 2016-10-07 DIAGNOSIS — G250 Essential tremor: Secondary | ICD-10-CM | POA: Diagnosis not present

## 2016-10-07 DIAGNOSIS — K8020 Calculus of gallbladder without cholecystitis without obstruction: Secondary | ICD-10-CM | POA: Diagnosis not present

## 2016-10-07 DIAGNOSIS — I5020 Unspecified systolic (congestive) heart failure: Secondary | ICD-10-CM | POA: Diagnosis not present

## 2016-10-07 NOTE — Telephone Encounter (Signed)
Chase Simmons, from [facility]. Please call her at [phone]. Verbal orders; 2x a week for 4 weeks, 2 as needed.

## 2016-10-07 NOTE — Telephone Encounter (Signed)
Please advise 

## 2016-10-08 NOTE — Telephone Encounter (Signed)
Verbal order can be given.  

## 2016-10-10 ENCOUNTER — Telehealth: Payer: Self-pay | Admitting: Family Medicine

## 2016-10-10 MED ORDER — MECLIZINE HCL 12.5 MG PO TABS: 12.5000 mg | ORAL_TABLET | Freq: Three times a day (TID) | ORAL | 0 refills | Status: DC | PRN

## 2016-10-10 NOTE — Telephone Encounter (Signed)
Refill provided. Patient needs a follow-up in the office. Thanks.

## 2016-10-10 NOTE — Telephone Encounter (Signed)
Refill on  meclizine (ANTIVERT) 12.5 MG tablet

## 2016-10-10 NOTE — Telephone Encounter (Signed)
Last OV 08/01/16 with Dr.Cook last filled 08/26/16 30 0rf by Dr.Patel

## 2016-10-10 NOTE — Telephone Encounter (Signed)
Left message with verbal orders 

## 2016-10-11 DIAGNOSIS — I110 Hypertensive heart disease with heart failure: Secondary | ICD-10-CM | POA: Diagnosis not present

## 2016-10-11 DIAGNOSIS — I5020 Unspecified systolic (congestive) heart failure: Secondary | ICD-10-CM | POA: Diagnosis not present

## 2016-10-11 DIAGNOSIS — I2510 Atherosclerotic heart disease of native coronary artery without angina pectoris: Secondary | ICD-10-CM | POA: Diagnosis not present

## 2016-10-11 DIAGNOSIS — E119 Type 2 diabetes mellitus without complications: Secondary | ICD-10-CM | POA: Diagnosis not present

## 2016-10-11 DIAGNOSIS — G40909 Epilepsy, unspecified, not intractable, without status epilepticus: Secondary | ICD-10-CM | POA: Diagnosis not present

## 2016-10-11 DIAGNOSIS — I480 Paroxysmal atrial fibrillation: Secondary | ICD-10-CM | POA: Diagnosis not present

## 2016-10-11 DIAGNOSIS — K8020 Calculus of gallbladder without cholecystitis without obstruction: Secondary | ICD-10-CM | POA: Diagnosis not present

## 2016-10-11 DIAGNOSIS — M6281 Muscle weakness (generalized): Secondary | ICD-10-CM | POA: Diagnosis not present

## 2016-10-11 DIAGNOSIS — G250 Essential tremor: Secondary | ICD-10-CM | POA: Diagnosis not present

## 2016-10-11 DIAGNOSIS — J449 Chronic obstructive pulmonary disease, unspecified: Secondary | ICD-10-CM | POA: Diagnosis not present

## 2016-10-12 NOTE — Telephone Encounter (Signed)
Verbal order given  

## 2016-10-12 NOTE — Telephone Encounter (Signed)
Stephanie from [facility] OT lvm for verbal orders for patient to have OT 2 x's weekly for 4 wks. Please cb with verbal [phone]

## 2016-10-14 DIAGNOSIS — K8020 Calculus of gallbladder without cholecystitis without obstruction: Secondary | ICD-10-CM | POA: Diagnosis not present

## 2016-10-14 DIAGNOSIS — G40909 Epilepsy, unspecified, not intractable, without status epilepticus: Secondary | ICD-10-CM | POA: Diagnosis not present

## 2016-10-14 DIAGNOSIS — I110 Hypertensive heart disease with heart failure: Secondary | ICD-10-CM | POA: Diagnosis not present

## 2016-10-14 DIAGNOSIS — I5020 Unspecified systolic (congestive) heart failure: Secondary | ICD-10-CM | POA: Diagnosis not present

## 2016-10-14 DIAGNOSIS — M6281 Muscle weakness (generalized): Secondary | ICD-10-CM | POA: Diagnosis not present

## 2016-10-14 DIAGNOSIS — J449 Chronic obstructive pulmonary disease, unspecified: Secondary | ICD-10-CM | POA: Diagnosis not present

## 2016-10-14 DIAGNOSIS — G250 Essential tremor: Secondary | ICD-10-CM | POA: Diagnosis not present

## 2016-10-14 DIAGNOSIS — E119 Type 2 diabetes mellitus without complications: Secondary | ICD-10-CM | POA: Diagnosis not present

## 2016-10-14 DIAGNOSIS — I2510 Atherosclerotic heart disease of native coronary artery without angina pectoris: Secondary | ICD-10-CM | POA: Diagnosis not present

## 2016-10-14 DIAGNOSIS — I480 Paroxysmal atrial fibrillation: Secondary | ICD-10-CM | POA: Diagnosis not present

## 2016-10-15 DIAGNOSIS — K8020 Calculus of gallbladder without cholecystitis without obstruction: Secondary | ICD-10-CM | POA: Diagnosis not present

## 2016-10-15 DIAGNOSIS — I480 Paroxysmal atrial fibrillation: Secondary | ICD-10-CM | POA: Diagnosis not present

## 2016-10-15 DIAGNOSIS — M6281 Muscle weakness (generalized): Secondary | ICD-10-CM | POA: Diagnosis not present

## 2016-10-15 DIAGNOSIS — I2510 Atherosclerotic heart disease of native coronary artery without angina pectoris: Secondary | ICD-10-CM | POA: Diagnosis not present

## 2016-10-15 DIAGNOSIS — E119 Type 2 diabetes mellitus without complications: Secondary | ICD-10-CM | POA: Diagnosis not present

## 2016-10-15 DIAGNOSIS — I5020 Unspecified systolic (congestive) heart failure: Secondary | ICD-10-CM | POA: Diagnosis not present

## 2016-10-15 DIAGNOSIS — G250 Essential tremor: Secondary | ICD-10-CM | POA: Diagnosis not present

## 2016-10-15 DIAGNOSIS — I110 Hypertensive heart disease with heart failure: Secondary | ICD-10-CM | POA: Diagnosis not present

## 2016-10-15 DIAGNOSIS — G40909 Epilepsy, unspecified, not intractable, without status epilepticus: Secondary | ICD-10-CM | POA: Diagnosis not present

## 2016-10-15 DIAGNOSIS — J449 Chronic obstructive pulmonary disease, unspecified: Secondary | ICD-10-CM | POA: Diagnosis not present

## 2016-10-18 DIAGNOSIS — G40909 Epilepsy, unspecified, not intractable, without status epilepticus: Secondary | ICD-10-CM | POA: Diagnosis not present

## 2016-10-18 DIAGNOSIS — M6281 Muscle weakness (generalized): Secondary | ICD-10-CM | POA: Diagnosis not present

## 2016-10-18 DIAGNOSIS — J449 Chronic obstructive pulmonary disease, unspecified: Secondary | ICD-10-CM | POA: Diagnosis not present

## 2016-10-18 DIAGNOSIS — I110 Hypertensive heart disease with heart failure: Secondary | ICD-10-CM | POA: Diagnosis not present

## 2016-10-18 DIAGNOSIS — I2510 Atherosclerotic heart disease of native coronary artery without angina pectoris: Secondary | ICD-10-CM | POA: Diagnosis not present

## 2016-10-18 DIAGNOSIS — I5020 Unspecified systolic (congestive) heart failure: Secondary | ICD-10-CM | POA: Diagnosis not present

## 2016-10-18 DIAGNOSIS — G250 Essential tremor: Secondary | ICD-10-CM | POA: Diagnosis not present

## 2016-10-18 DIAGNOSIS — K8020 Calculus of gallbladder without cholecystitis without obstruction: Secondary | ICD-10-CM | POA: Diagnosis not present

## 2016-10-18 DIAGNOSIS — E119 Type 2 diabetes mellitus without complications: Secondary | ICD-10-CM | POA: Diagnosis not present

## 2016-10-18 DIAGNOSIS — I480 Paroxysmal atrial fibrillation: Secondary | ICD-10-CM | POA: Diagnosis not present

## 2016-10-19 DIAGNOSIS — G250 Essential tremor: Secondary | ICD-10-CM | POA: Diagnosis not present

## 2016-10-19 DIAGNOSIS — E119 Type 2 diabetes mellitus without complications: Secondary | ICD-10-CM | POA: Diagnosis not present

## 2016-10-19 DIAGNOSIS — K8020 Calculus of gallbladder without cholecystitis without obstruction: Secondary | ICD-10-CM | POA: Diagnosis not present

## 2016-10-19 DIAGNOSIS — I5020 Unspecified systolic (congestive) heart failure: Secondary | ICD-10-CM | POA: Diagnosis not present

## 2016-10-19 DIAGNOSIS — G40909 Epilepsy, unspecified, not intractable, without status epilepticus: Secondary | ICD-10-CM | POA: Diagnosis not present

## 2016-10-19 DIAGNOSIS — I2510 Atherosclerotic heart disease of native coronary artery without angina pectoris: Secondary | ICD-10-CM | POA: Diagnosis not present

## 2016-10-19 DIAGNOSIS — J449 Chronic obstructive pulmonary disease, unspecified: Secondary | ICD-10-CM | POA: Diagnosis not present

## 2016-10-19 DIAGNOSIS — M6281 Muscle weakness (generalized): Secondary | ICD-10-CM | POA: Diagnosis not present

## 2016-10-19 DIAGNOSIS — I110 Hypertensive heart disease with heart failure: Secondary | ICD-10-CM | POA: Diagnosis not present

## 2016-10-19 DIAGNOSIS — I480 Paroxysmal atrial fibrillation: Secondary | ICD-10-CM | POA: Diagnosis not present

## 2016-10-20 DIAGNOSIS — I2510 Atherosclerotic heart disease of native coronary artery without angina pectoris: Secondary | ICD-10-CM | POA: Diagnosis not present

## 2016-10-20 DIAGNOSIS — E119 Type 2 diabetes mellitus without complications: Secondary | ICD-10-CM | POA: Diagnosis not present

## 2016-10-20 DIAGNOSIS — M6281 Muscle weakness (generalized): Secondary | ICD-10-CM | POA: Diagnosis not present

## 2016-10-20 DIAGNOSIS — J449 Chronic obstructive pulmonary disease, unspecified: Secondary | ICD-10-CM | POA: Diagnosis not present

## 2016-10-20 DIAGNOSIS — K8020 Calculus of gallbladder without cholecystitis without obstruction: Secondary | ICD-10-CM | POA: Diagnosis not present

## 2016-10-20 DIAGNOSIS — G40909 Epilepsy, unspecified, not intractable, without status epilepticus: Secondary | ICD-10-CM | POA: Diagnosis not present

## 2016-10-20 DIAGNOSIS — I110 Hypertensive heart disease with heart failure: Secondary | ICD-10-CM | POA: Diagnosis not present

## 2016-10-20 DIAGNOSIS — G250 Essential tremor: Secondary | ICD-10-CM | POA: Diagnosis not present

## 2016-10-20 DIAGNOSIS — I480 Paroxysmal atrial fibrillation: Secondary | ICD-10-CM | POA: Diagnosis not present

## 2016-10-20 DIAGNOSIS — I5020 Unspecified systolic (congestive) heart failure: Secondary | ICD-10-CM | POA: Diagnosis not present

## 2016-10-25 DIAGNOSIS — J449 Chronic obstructive pulmonary disease, unspecified: Secondary | ICD-10-CM | POA: Diagnosis not present

## 2016-10-25 DIAGNOSIS — K8020 Calculus of gallbladder without cholecystitis without obstruction: Secondary | ICD-10-CM | POA: Diagnosis not present

## 2016-10-25 DIAGNOSIS — I480 Paroxysmal atrial fibrillation: Secondary | ICD-10-CM | POA: Diagnosis not present

## 2016-10-25 DIAGNOSIS — I110 Hypertensive heart disease with heart failure: Secondary | ICD-10-CM | POA: Diagnosis not present

## 2016-10-25 DIAGNOSIS — I2510 Atherosclerotic heart disease of native coronary artery without angina pectoris: Secondary | ICD-10-CM | POA: Diagnosis not present

## 2016-10-25 DIAGNOSIS — M6281 Muscle weakness (generalized): Secondary | ICD-10-CM | POA: Diagnosis not present

## 2016-10-25 DIAGNOSIS — I5020 Unspecified systolic (congestive) heart failure: Secondary | ICD-10-CM | POA: Diagnosis not present

## 2016-10-25 DIAGNOSIS — G250 Essential tremor: Secondary | ICD-10-CM | POA: Diagnosis not present

## 2016-10-25 DIAGNOSIS — E119 Type 2 diabetes mellitus without complications: Secondary | ICD-10-CM | POA: Diagnosis not present

## 2016-10-25 DIAGNOSIS — G40909 Epilepsy, unspecified, not intractable, without status epilepticus: Secondary | ICD-10-CM | POA: Diagnosis not present

## 2016-10-27 DIAGNOSIS — K8020 Calculus of gallbladder without cholecystitis without obstruction: Secondary | ICD-10-CM | POA: Diagnosis not present

## 2016-10-27 DIAGNOSIS — G40909 Epilepsy, unspecified, not intractable, without status epilepticus: Secondary | ICD-10-CM | POA: Diagnosis not present

## 2016-10-27 DIAGNOSIS — M6281 Muscle weakness (generalized): Secondary | ICD-10-CM | POA: Diagnosis not present

## 2016-10-27 DIAGNOSIS — I110 Hypertensive heart disease with heart failure: Secondary | ICD-10-CM | POA: Diagnosis not present

## 2016-10-27 DIAGNOSIS — I480 Paroxysmal atrial fibrillation: Secondary | ICD-10-CM | POA: Diagnosis not present

## 2016-10-27 DIAGNOSIS — E119 Type 2 diabetes mellitus without complications: Secondary | ICD-10-CM | POA: Diagnosis not present

## 2016-10-27 DIAGNOSIS — J449 Chronic obstructive pulmonary disease, unspecified: Secondary | ICD-10-CM | POA: Diagnosis not present

## 2016-10-27 DIAGNOSIS — G250 Essential tremor: Secondary | ICD-10-CM | POA: Diagnosis not present

## 2016-10-27 DIAGNOSIS — I2510 Atherosclerotic heart disease of native coronary artery without angina pectoris: Secondary | ICD-10-CM | POA: Diagnosis not present

## 2016-10-27 DIAGNOSIS — I5020 Unspecified systolic (congestive) heart failure: Secondary | ICD-10-CM | POA: Diagnosis not present

## 2016-10-28 DIAGNOSIS — E119 Type 2 diabetes mellitus without complications: Secondary | ICD-10-CM | POA: Diagnosis not present

## 2016-10-28 DIAGNOSIS — I2510 Atherosclerotic heart disease of native coronary artery without angina pectoris: Secondary | ICD-10-CM | POA: Diagnosis not present

## 2016-10-28 DIAGNOSIS — M6281 Muscle weakness (generalized): Secondary | ICD-10-CM | POA: Diagnosis not present

## 2016-10-28 DIAGNOSIS — I110 Hypertensive heart disease with heart failure: Secondary | ICD-10-CM | POA: Diagnosis not present

## 2016-10-28 DIAGNOSIS — J449 Chronic obstructive pulmonary disease, unspecified: Secondary | ICD-10-CM | POA: Diagnosis not present

## 2016-10-28 DIAGNOSIS — K8020 Calculus of gallbladder without cholecystitis without obstruction: Secondary | ICD-10-CM | POA: Diagnosis not present

## 2016-10-28 DIAGNOSIS — G250 Essential tremor: Secondary | ICD-10-CM | POA: Diagnosis not present

## 2016-10-28 DIAGNOSIS — I480 Paroxysmal atrial fibrillation: Secondary | ICD-10-CM | POA: Diagnosis not present

## 2016-10-28 DIAGNOSIS — G40909 Epilepsy, unspecified, not intractable, without status epilepticus: Secondary | ICD-10-CM | POA: Diagnosis not present

## 2016-10-28 DIAGNOSIS — I5020 Unspecified systolic (congestive) heart failure: Secondary | ICD-10-CM | POA: Diagnosis not present

## 2016-10-29 ENCOUNTER — Other Ambulatory Visit: Payer: Self-pay | Admitting: Family Medicine

## 2016-10-31 DIAGNOSIS — I480 Paroxysmal atrial fibrillation: Secondary | ICD-10-CM | POA: Diagnosis not present

## 2016-10-31 DIAGNOSIS — E119 Type 2 diabetes mellitus without complications: Secondary | ICD-10-CM | POA: Diagnosis not present

## 2016-10-31 DIAGNOSIS — I5020 Unspecified systolic (congestive) heart failure: Secondary | ICD-10-CM | POA: Diagnosis not present

## 2016-10-31 DIAGNOSIS — G250 Essential tremor: Secondary | ICD-10-CM | POA: Diagnosis not present

## 2016-10-31 DIAGNOSIS — M6281 Muscle weakness (generalized): Secondary | ICD-10-CM | POA: Diagnosis not present

## 2016-10-31 DIAGNOSIS — I110 Hypertensive heart disease with heart failure: Secondary | ICD-10-CM | POA: Diagnosis not present

## 2016-10-31 DIAGNOSIS — J449 Chronic obstructive pulmonary disease, unspecified: Secondary | ICD-10-CM | POA: Diagnosis not present

## 2016-10-31 DIAGNOSIS — I2510 Atherosclerotic heart disease of native coronary artery without angina pectoris: Secondary | ICD-10-CM | POA: Diagnosis not present

## 2016-10-31 DIAGNOSIS — K8020 Calculus of gallbladder without cholecystitis without obstruction: Secondary | ICD-10-CM | POA: Diagnosis not present

## 2016-10-31 DIAGNOSIS — G40909 Epilepsy, unspecified, not intractable, without status epilepticus: Secondary | ICD-10-CM | POA: Diagnosis not present

## 2016-11-01 ENCOUNTER — Ambulatory Visit: Payer: Medicare HMO | Admitting: Family Medicine

## 2016-11-01 DIAGNOSIS — E119 Type 2 diabetes mellitus without complications: Secondary | ICD-10-CM | POA: Diagnosis not present

## 2016-11-01 DIAGNOSIS — I2510 Atherosclerotic heart disease of native coronary artery without angina pectoris: Secondary | ICD-10-CM | POA: Diagnosis not present

## 2016-11-01 DIAGNOSIS — M6281 Muscle weakness (generalized): Secondary | ICD-10-CM | POA: Diagnosis not present

## 2016-11-01 DIAGNOSIS — J449 Chronic obstructive pulmonary disease, unspecified: Secondary | ICD-10-CM | POA: Diagnosis not present

## 2016-11-01 DIAGNOSIS — K8020 Calculus of gallbladder without cholecystitis without obstruction: Secondary | ICD-10-CM | POA: Diagnosis not present

## 2016-11-01 DIAGNOSIS — G250 Essential tremor: Secondary | ICD-10-CM | POA: Diagnosis not present

## 2016-11-01 DIAGNOSIS — I110 Hypertensive heart disease with heart failure: Secondary | ICD-10-CM | POA: Diagnosis not present

## 2016-11-01 DIAGNOSIS — I5020 Unspecified systolic (congestive) heart failure: Secondary | ICD-10-CM | POA: Diagnosis not present

## 2016-11-01 DIAGNOSIS — I480 Paroxysmal atrial fibrillation: Secondary | ICD-10-CM | POA: Diagnosis not present

## 2016-11-01 DIAGNOSIS — G40909 Epilepsy, unspecified, not intractable, without status epilepticus: Secondary | ICD-10-CM | POA: Diagnosis not present

## 2016-11-02 DIAGNOSIS — G250 Essential tremor: Secondary | ICD-10-CM | POA: Diagnosis not present

## 2016-11-02 DIAGNOSIS — I110 Hypertensive heart disease with heart failure: Secondary | ICD-10-CM | POA: Diagnosis not present

## 2016-11-02 DIAGNOSIS — K8020 Calculus of gallbladder without cholecystitis without obstruction: Secondary | ICD-10-CM | POA: Diagnosis not present

## 2016-11-02 DIAGNOSIS — J449 Chronic obstructive pulmonary disease, unspecified: Secondary | ICD-10-CM | POA: Diagnosis not present

## 2016-11-02 DIAGNOSIS — I2510 Atherosclerotic heart disease of native coronary artery without angina pectoris: Secondary | ICD-10-CM | POA: Diagnosis not present

## 2016-11-02 DIAGNOSIS — I480 Paroxysmal atrial fibrillation: Secondary | ICD-10-CM | POA: Diagnosis not present

## 2016-11-02 DIAGNOSIS — E119 Type 2 diabetes mellitus without complications: Secondary | ICD-10-CM | POA: Diagnosis not present

## 2016-11-02 DIAGNOSIS — G40909 Epilepsy, unspecified, not intractable, without status epilepticus: Secondary | ICD-10-CM | POA: Diagnosis not present

## 2016-11-02 DIAGNOSIS — M6281 Muscle weakness (generalized): Secondary | ICD-10-CM | POA: Diagnosis not present

## 2016-11-02 DIAGNOSIS — I5020 Unspecified systolic (congestive) heart failure: Secondary | ICD-10-CM | POA: Diagnosis not present

## 2016-11-03 DIAGNOSIS — I5020 Unspecified systolic (congestive) heart failure: Secondary | ICD-10-CM | POA: Diagnosis not present

## 2016-11-03 DIAGNOSIS — K8020 Calculus of gallbladder without cholecystitis without obstruction: Secondary | ICD-10-CM | POA: Diagnosis not present

## 2016-11-03 DIAGNOSIS — E119 Type 2 diabetes mellitus without complications: Secondary | ICD-10-CM | POA: Diagnosis not present

## 2016-11-03 DIAGNOSIS — G40909 Epilepsy, unspecified, not intractable, without status epilepticus: Secondary | ICD-10-CM | POA: Diagnosis not present

## 2016-11-03 DIAGNOSIS — I110 Hypertensive heart disease with heart failure: Secondary | ICD-10-CM | POA: Diagnosis not present

## 2016-11-03 DIAGNOSIS — I2510 Atherosclerotic heart disease of native coronary artery without angina pectoris: Secondary | ICD-10-CM | POA: Diagnosis not present

## 2016-11-03 DIAGNOSIS — J449 Chronic obstructive pulmonary disease, unspecified: Secondary | ICD-10-CM | POA: Diagnosis not present

## 2016-11-03 DIAGNOSIS — I480 Paroxysmal atrial fibrillation: Secondary | ICD-10-CM | POA: Diagnosis not present

## 2016-11-03 DIAGNOSIS — G250 Essential tremor: Secondary | ICD-10-CM | POA: Diagnosis not present

## 2016-11-03 DIAGNOSIS — M6281 Muscle weakness (generalized): Secondary | ICD-10-CM | POA: Diagnosis not present

## 2016-11-04 ENCOUNTER — Telehealth: Payer: Self-pay | Admitting: Family Medicine

## 2016-11-04 DIAGNOSIS — K8020 Calculus of gallbladder without cholecystitis without obstruction: Secondary | ICD-10-CM | POA: Diagnosis not present

## 2016-11-04 DIAGNOSIS — G250 Essential tremor: Secondary | ICD-10-CM | POA: Diagnosis not present

## 2016-11-04 DIAGNOSIS — I5020 Unspecified systolic (congestive) heart failure: Secondary | ICD-10-CM | POA: Diagnosis not present

## 2016-11-04 DIAGNOSIS — I110 Hypertensive heart disease with heart failure: Secondary | ICD-10-CM | POA: Diagnosis not present

## 2016-11-04 DIAGNOSIS — I480 Paroxysmal atrial fibrillation: Secondary | ICD-10-CM | POA: Diagnosis not present

## 2016-11-04 DIAGNOSIS — M6281 Muscle weakness (generalized): Secondary | ICD-10-CM | POA: Diagnosis not present

## 2016-11-04 DIAGNOSIS — I2510 Atherosclerotic heart disease of native coronary artery without angina pectoris: Secondary | ICD-10-CM | POA: Diagnosis not present

## 2016-11-04 DIAGNOSIS — E119 Type 2 diabetes mellitus without complications: Secondary | ICD-10-CM | POA: Diagnosis not present

## 2016-11-04 DIAGNOSIS — J449 Chronic obstructive pulmonary disease, unspecified: Secondary | ICD-10-CM | POA: Diagnosis not present

## 2016-11-04 DIAGNOSIS — G40909 Epilepsy, unspecified, not intractable, without status epilepticus: Secondary | ICD-10-CM | POA: Diagnosis not present

## 2016-11-04 NOTE — Telephone Encounter (Signed)
Verbal order given  

## 2016-11-04 NOTE — Telephone Encounter (Signed)
Verbal orders can be given. 

## 2016-11-04 NOTE — Telephone Encounter (Signed)
Please advise 

## 2016-11-04 NOTE — Telephone Encounter (Signed)
Copied from [place] #[id number]. Topic: Referral - Request >> Nov 04, 2016  8:59 AM Conception Chancy, NT wrote: Reason for CRM: Colletta Maryland with [facility] would like verbal orders for occupational therapy 2x a week for 3 weeks. States she called earlier this week and has not heard back yet.  Please contact stephanie @ [phone]

## 2016-11-08 ENCOUNTER — Encounter: Payer: Self-pay | Admitting: Family Medicine

## 2016-11-08 ENCOUNTER — Ambulatory Visit (INDEPENDENT_AMBULATORY_CARE_PROVIDER_SITE_OTHER): Payer: Medicare HMO | Admitting: Family Medicine

## 2016-11-08 VITALS — BP 124/64 | HR 54 | Temp 98.1°F

## 2016-11-08 DIAGNOSIS — I10 Essential (primary) hypertension: Secondary | ICD-10-CM

## 2016-11-08 DIAGNOSIS — E118 Type 2 diabetes mellitus with unspecified complications: Secondary | ICD-10-CM | POA: Diagnosis not present

## 2016-11-08 DIAGNOSIS — I480 Paroxysmal atrial fibrillation: Secondary | ICD-10-CM

## 2016-11-08 DIAGNOSIS — R42 Dizziness and giddiness: Secondary | ICD-10-CM | POA: Diagnosis not present

## 2016-11-08 DIAGNOSIS — Z23 Encounter for immunization: Secondary | ICD-10-CM

## 2016-11-08 DIAGNOSIS — I110 Hypertensive heart disease with heart failure: Secondary | ICD-10-CM | POA: Diagnosis not present

## 2016-11-08 DIAGNOSIS — G250 Essential tremor: Secondary | ICD-10-CM | POA: Diagnosis not present

## 2016-11-08 DIAGNOSIS — J449 Chronic obstructive pulmonary disease, unspecified: Secondary | ICD-10-CM | POA: Diagnosis not present

## 2016-11-08 DIAGNOSIS — E119 Type 2 diabetes mellitus without complications: Secondary | ICD-10-CM | POA: Diagnosis not present

## 2016-11-08 DIAGNOSIS — I5020 Unspecified systolic (congestive) heart failure: Secondary | ICD-10-CM | POA: Diagnosis not present

## 2016-11-08 DIAGNOSIS — K8020 Calculus of gallbladder without cholecystitis without obstruction: Secondary | ICD-10-CM | POA: Diagnosis not present

## 2016-11-08 DIAGNOSIS — R600 Localized edema: Secondary | ICD-10-CM | POA: Diagnosis not present

## 2016-11-08 DIAGNOSIS — I2510 Atherosclerotic heart disease of native coronary artery without angina pectoris: Secondary | ICD-10-CM | POA: Diagnosis not present

## 2016-11-08 DIAGNOSIS — M6281 Muscle weakness (generalized): Secondary | ICD-10-CM | POA: Diagnosis not present

## 2016-11-08 DIAGNOSIS — G40909 Epilepsy, unspecified, not intractable, without status epilepticus: Secondary | ICD-10-CM | POA: Diagnosis not present

## 2016-11-08 NOTE — Assessment & Plan Note (Signed)
Check A1c.  Continue current regimen. 

## 2016-11-08 NOTE — Patient Instructions (Signed)
Nice to see you. Please discontinue the meclizine. Please discontinue the amlodipine.  Please monitor his blood pressure daily.  If it starts to run higher than 140/80 consistently please let us know.  Please contact us in 1 week with his readings. We will get blood work today and contact you with the results. We will get you into see cardiology.

## 2016-11-08 NOTE — Assessment & Plan Note (Addendum)
Bradycardic.  No rate control medications.  Not on anticoagulation given history of falls and noncompliance.  It appears that aspirin 325 mg was advised by cardiology previously.  He will resume this.  We will refer back to cardiology.

## 2016-11-08 NOTE — Progress Notes (Signed)
  Tommi Rumps, MD Phone: [phone]  Chase Simmons is a 83 y.o. male who presents today for follow-up.  A. fib: Patient notes no flutters or chest pain or shortness of breath.  Not on any rate control medications.  Not on anticoagulation related to history of noncompliance and falls.  Does not note any recent falls.  Diabetes: Typically around 150.  Taking metformin and glimepiride.  Does note some polyuria though no polydipsia.  No hypoglycemia.  Currently taking amlodipine and lisinopril for his blood pressure.  Has noted lower extremity swelling since going on the.  Notes no PND.  Sleeps in a chair so difficult to evaluate orthopnea.  Vertigo has improved significantly.  Has been taking meclizine 3 times a day for a number of weeks now.  PMH: Former smoker   ROS see history of present illness  Objective  Physical Exam Vitals:   11/08/16 1610  BP: 124/64  Pulse: (!) 54  Temp: 98.1 F (36.7 C)  SpO2: 98%    BP Readings from Last 3 Encounters:  11/08/16 124/64  09/06/16 132/64  08/26/16 (!) 155/67   Wt Readings from Last 3 Encounters:  09/06/16 247 lb (112 kg)  08/26/16 247 lb 3.2 oz (112.1 kg)  08/23/16 230 lb (104.3 kg)    Physical Exam  Constitutional: No distress.  Cardiovascular: Normal heart sounds. Bradycardia present.  Regular with occasional extra heartbeat  Pulmonary/Chest: Effort normal and breath sounds normal.  Musculoskeletal: He exhibits edema (1+ pitting edema bilaterally).  Neurological: He is alert.  Skin: Skin is warm and dry. He is not diaphoretic.     Assessment/Plan: Please see individual problem list.  PAF (paroxysmal atrial fibrillation) ([place]) Bradycardic.  No rate control medications.  Not on anticoagulation given history of falls and noncompliance.  It appears that aspirin 325 mg was advised by cardiology previously.  He will resume this.  We will refer back to cardiology.  HTN (hypertension) Discontinue amlodipine given  swelling.  They will monitor his blood pressure at home and if consistently greater than 140/80 they will let us know.  They will contact us in 1 week with his blood pressure readings.  Type 2 diabetes mellitus with complication, without long-term current use of insulin (HCC) Check A1c.  Continue current regimen.  Dizziness Related to vertigo.  Has resolved.  He will discontinue meclizine.  Bilateral leg edema Suspect related to amlodipine.  We will have him discontinue this.  Check lab work to evaluate for other causes.   Orders Placed This Encounter  Procedures  . Flu vaccine HIGH DOSE PF  . Comp Met (CMET)  . TSH  . B Nat Peptide  . Hemoglobin A1c  . CBC   Tommi Rumps, MD [place]

## 2016-11-08 NOTE — Assessment & Plan Note (Signed)
Discontinue amlodipine given swelling.  They will monitor his blood pressure at home and if consistently greater than 140/80 they will let us know.  They will contact us in 1 week with his blood pressure readings.

## 2016-11-08 NOTE — Assessment & Plan Note (Signed)
Related to vertigo.  Has resolved.  He will discontinue meclizine.

## 2016-11-08 NOTE — Assessment & Plan Note (Signed)
Suspect related to amlodipine.  We will have him discontinue this.  Check lab work to evaluate for other causes.

## 2016-11-09 DIAGNOSIS — J449 Chronic obstructive pulmonary disease, unspecified: Secondary | ICD-10-CM | POA: Diagnosis not present

## 2016-11-09 DIAGNOSIS — I480 Paroxysmal atrial fibrillation: Secondary | ICD-10-CM | POA: Diagnosis not present

## 2016-11-09 DIAGNOSIS — I110 Hypertensive heart disease with heart failure: Secondary | ICD-10-CM | POA: Diagnosis not present

## 2016-11-09 DIAGNOSIS — G40909 Epilepsy, unspecified, not intractable, without status epilepticus: Secondary | ICD-10-CM | POA: Diagnosis not present

## 2016-11-09 DIAGNOSIS — G250 Essential tremor: Secondary | ICD-10-CM | POA: Diagnosis not present

## 2016-11-09 DIAGNOSIS — I5020 Unspecified systolic (congestive) heart failure: Secondary | ICD-10-CM | POA: Diagnosis not present

## 2016-11-09 DIAGNOSIS — M6281 Muscle weakness (generalized): Secondary | ICD-10-CM | POA: Diagnosis not present

## 2016-11-09 DIAGNOSIS — E119 Type 2 diabetes mellitus without complications: Secondary | ICD-10-CM | POA: Diagnosis not present

## 2016-11-09 DIAGNOSIS — I2510 Atherosclerotic heart disease of native coronary artery without angina pectoris: Secondary | ICD-10-CM | POA: Diagnosis not present

## 2016-11-09 DIAGNOSIS — K8020 Calculus of gallbladder without cholecystitis without obstruction: Secondary | ICD-10-CM | POA: Diagnosis not present

## 2016-11-09 LAB — CBC
HEMATOCRIT: 35 % — AB (ref 38.5–50.0)
HEMOGLOBIN: 12 g/dL — AB (ref 13.2–17.1)
MCH: 33.9 pg — ABNORMAL HIGH (ref 27.0–33.0)
MCHC: 34.3 g/dL (ref 32.0–36.0)
MCV: 98.9 fL (ref 80.0–100.0)
MPV: 12.9 fL — ABNORMAL HIGH (ref 7.5–12.5)
Platelets: 183 10*3/uL (ref 140–400)
RBC: 3.54 10*6/uL — ABNORMAL LOW (ref 4.20–5.80)
RDW: 11.4 % (ref 11.0–15.0)
WBC: 7 10*3/uL (ref 3.8–10.8)

## 2016-11-09 LAB — HEMOGLOBIN A1C
HEMOGLOBIN A1C: 6.4 %{Hb} — AB (ref <5.7)
Mean Plasma Glucose: 137 (calc)
eAG (mmol/L): 7.6 (calc)

## 2016-11-09 LAB — COMPREHENSIVE METABOLIC PANEL
ALT: 17 U/L (ref 0–53)
AST: 17 U/L (ref 0–37)
Albumin: 3.8 g/dL (ref 3.5–5.2)
Alkaline Phosphatase: 71 U/L (ref 39–117)
BUN: 31 mg/dL — ABNORMAL HIGH (ref 6–23)
CALCIUM: 9.7 mg/dL (ref 8.4–10.5)
CHLORIDE: 104 meq/L (ref 96–112)
CO2: 29 meq/L (ref 19–32)
Creatinine, Ser: 1.09 mg/dL (ref 0.40–1.50)
GFR: 68.88 mL/min (ref >60.00)
GLUCOSE: 114 mg/dL — AB (ref 70–99)
POTASSIUM: 4.6 meq/L (ref 3.5–5.1)
Sodium: 139 mEq/L (ref 135–145)
Total Bilirubin: 0.4 mg/dL (ref 0.2–1.2)
Total Protein: 6.7 g/dL (ref 6.0–8.3)

## 2016-11-09 LAB — TSH: TSH: 1.5 u[IU]/mL (ref 0.35–4.50)

## 2016-11-09 LAB — BRAIN NATRIURETIC PEPTIDE: BRAIN NATRIURETIC PEPTIDE: 51 pg/mL (ref <100)

## 2016-11-12 DIAGNOSIS — I2510 Atherosclerotic heart disease of native coronary artery without angina pectoris: Secondary | ICD-10-CM | POA: Diagnosis not present

## 2016-11-12 DIAGNOSIS — E119 Type 2 diabetes mellitus without complications: Secondary | ICD-10-CM | POA: Diagnosis not present

## 2016-11-12 DIAGNOSIS — I5020 Unspecified systolic (congestive) heart failure: Secondary | ICD-10-CM | POA: Diagnosis not present

## 2016-11-12 DIAGNOSIS — J449 Chronic obstructive pulmonary disease, unspecified: Secondary | ICD-10-CM | POA: Diagnosis not present

## 2016-11-12 DIAGNOSIS — G250 Essential tremor: Secondary | ICD-10-CM | POA: Diagnosis not present

## 2016-11-12 DIAGNOSIS — I480 Paroxysmal atrial fibrillation: Secondary | ICD-10-CM | POA: Diagnosis not present

## 2016-11-12 DIAGNOSIS — M6281 Muscle weakness (generalized): Secondary | ICD-10-CM | POA: Diagnosis not present

## 2016-11-12 DIAGNOSIS — G40909 Epilepsy, unspecified, not intractable, without status epilepticus: Secondary | ICD-10-CM | POA: Diagnosis not present

## 2016-11-12 DIAGNOSIS — K8020 Calculus of gallbladder without cholecystitis without obstruction: Secondary | ICD-10-CM | POA: Diagnosis not present

## 2016-11-12 DIAGNOSIS — I110 Hypertensive heart disease with heart failure: Secondary | ICD-10-CM | POA: Diagnosis not present

## 2016-11-13 DIAGNOSIS — G40909 Epilepsy, unspecified, not intractable, without status epilepticus: Secondary | ICD-10-CM | POA: Diagnosis not present

## 2016-11-13 DIAGNOSIS — M6281 Muscle weakness (generalized): Secondary | ICD-10-CM | POA: Diagnosis not present

## 2016-11-13 DIAGNOSIS — I5020 Unspecified systolic (congestive) heart failure: Secondary | ICD-10-CM | POA: Diagnosis not present

## 2016-11-13 DIAGNOSIS — G250 Essential tremor: Secondary | ICD-10-CM | POA: Diagnosis not present

## 2016-11-13 DIAGNOSIS — I110 Hypertensive heart disease with heart failure: Secondary | ICD-10-CM | POA: Diagnosis not present

## 2016-11-13 DIAGNOSIS — K8020 Calculus of gallbladder without cholecystitis without obstruction: Secondary | ICD-10-CM | POA: Diagnosis not present

## 2016-11-13 DIAGNOSIS — I480 Paroxysmal atrial fibrillation: Secondary | ICD-10-CM | POA: Diagnosis not present

## 2016-11-13 DIAGNOSIS — E119 Type 2 diabetes mellitus without complications: Secondary | ICD-10-CM | POA: Diagnosis not present

## 2016-11-13 DIAGNOSIS — J449 Chronic obstructive pulmonary disease, unspecified: Secondary | ICD-10-CM | POA: Diagnosis not present

## 2016-11-13 DIAGNOSIS — I2510 Atherosclerotic heart disease of native coronary artery without angina pectoris: Secondary | ICD-10-CM | POA: Diagnosis not present

## 2016-11-14 ENCOUNTER — Telehealth: Payer: Self-pay

## 2016-11-14 DIAGNOSIS — I480 Paroxysmal atrial fibrillation: Secondary | ICD-10-CM | POA: Diagnosis not present

## 2016-11-14 DIAGNOSIS — I110 Hypertensive heart disease with heart failure: Secondary | ICD-10-CM | POA: Diagnosis not present

## 2016-11-14 DIAGNOSIS — G40909 Epilepsy, unspecified, not intractable, without status epilepticus: Secondary | ICD-10-CM | POA: Diagnosis not present

## 2016-11-14 DIAGNOSIS — M6281 Muscle weakness (generalized): Secondary | ICD-10-CM | POA: Diagnosis not present

## 2016-11-14 DIAGNOSIS — I2510 Atherosclerotic heart disease of native coronary artery without angina pectoris: Secondary | ICD-10-CM | POA: Diagnosis not present

## 2016-11-14 DIAGNOSIS — G250 Essential tremor: Secondary | ICD-10-CM | POA: Diagnosis not present

## 2016-11-14 DIAGNOSIS — E119 Type 2 diabetes mellitus without complications: Secondary | ICD-10-CM | POA: Diagnosis not present

## 2016-11-14 DIAGNOSIS — J449 Chronic obstructive pulmonary disease, unspecified: Secondary | ICD-10-CM | POA: Diagnosis not present

## 2016-11-14 DIAGNOSIS — I5020 Unspecified systolic (congestive) heart failure: Secondary | ICD-10-CM | POA: Diagnosis not present

## 2016-11-14 DIAGNOSIS — K8020 Calculus of gallbladder without cholecystitis without obstruction: Secondary | ICD-10-CM | POA: Diagnosis not present

## 2016-11-14 NOTE — Telephone Encounter (Signed)
-----   Message from Leone Haven, MD sent at 11/09/2016  9:24 AM EST ----- Please let the patient know that he is slightly anemic.  He needs recheck of this in the next 1 week and if remains low needs stool cards completed.  His A1c is well controlled.  We will contact him when the rest of his lab work comes back.  Thanks.

## 2016-11-15 ENCOUNTER — Other Ambulatory Visit: Payer: Self-pay

## 2016-11-15 ENCOUNTER — Telehealth: Payer: Self-pay

## 2016-11-15 ENCOUNTER — Telehealth: Payer: Self-pay | Admitting: Family Medicine

## 2016-11-15 DIAGNOSIS — G40909 Epilepsy, unspecified, not intractable, without status epilepticus: Secondary | ICD-10-CM | POA: Diagnosis not present

## 2016-11-15 DIAGNOSIS — I5020 Unspecified systolic (congestive) heart failure: Secondary | ICD-10-CM | POA: Diagnosis not present

## 2016-11-15 DIAGNOSIS — I480 Paroxysmal atrial fibrillation: Secondary | ICD-10-CM | POA: Diagnosis not present

## 2016-11-15 DIAGNOSIS — I2510 Atherosclerotic heart disease of native coronary artery without angina pectoris: Secondary | ICD-10-CM | POA: Diagnosis not present

## 2016-11-15 DIAGNOSIS — K8020 Calculus of gallbladder without cholecystitis without obstruction: Secondary | ICD-10-CM | POA: Diagnosis not present

## 2016-11-15 DIAGNOSIS — G250 Essential tremor: Secondary | ICD-10-CM | POA: Diagnosis not present

## 2016-11-15 DIAGNOSIS — J449 Chronic obstructive pulmonary disease, unspecified: Secondary | ICD-10-CM | POA: Diagnosis not present

## 2016-11-15 DIAGNOSIS — E119 Type 2 diabetes mellitus without complications: Secondary | ICD-10-CM | POA: Diagnosis not present

## 2016-11-15 DIAGNOSIS — I110 Hypertensive heart disease with heart failure: Secondary | ICD-10-CM | POA: Diagnosis not present

## 2016-11-15 DIAGNOSIS — M6281 Muscle weakness (generalized): Secondary | ICD-10-CM | POA: Diagnosis not present

## 2016-11-15 MED ORDER — METFORMIN HCL 1000 MG PO TABS: 1000.0000 mg | ORAL_TABLET | Freq: Two times a day (BID) | ORAL | 6 refills | Status: DC

## 2016-11-15 NOTE — Telephone Encounter (Signed)
Copied from [place] [phone]. Topic: Inquiry >> Nov 15, 2016  2:35 PM Corie Chiquito, [place] wrote: Reason for CRM: Patient daughter called because she would like to speak with someone about her fathers blood work that he had done last week. If someone could please give her a call back about this please

## 2016-11-15 NOTE — Telephone Encounter (Signed)
Informed patients daughter I am unable to speak to her about the patient. There is one DPR in chart from 2014 giving authorization to "NONE". Patients daughter will come by with patient and fill out form.

## 2016-11-15 NOTE — Telephone Encounter (Signed)
Copied from [place] #[id number]. Topic: Quick Communication - See Telephone Encounter >> Nov 15, 2016  2:33 PM Corie Chiquito, [place] wrote: CRM for notification. See Telephone encounter for: Patient daughter called because her father needs a refill on his Metformin  11/15/16.

## 2016-11-16 DIAGNOSIS — E119 Type 2 diabetes mellitus without complications: Secondary | ICD-10-CM | POA: Diagnosis not present

## 2016-11-16 DIAGNOSIS — J449 Chronic obstructive pulmonary disease, unspecified: Secondary | ICD-10-CM | POA: Diagnosis not present

## 2016-11-16 DIAGNOSIS — G40909 Epilepsy, unspecified, not intractable, without status epilepticus: Secondary | ICD-10-CM | POA: Diagnosis not present

## 2016-11-16 DIAGNOSIS — G250 Essential tremor: Secondary | ICD-10-CM | POA: Diagnosis not present

## 2016-11-16 DIAGNOSIS — I110 Hypertensive heart disease with heart failure: Secondary | ICD-10-CM | POA: Diagnosis not present

## 2016-11-16 DIAGNOSIS — K8020 Calculus of gallbladder without cholecystitis without obstruction: Secondary | ICD-10-CM | POA: Diagnosis not present

## 2016-11-16 DIAGNOSIS — M6281 Muscle weakness (generalized): Secondary | ICD-10-CM | POA: Diagnosis not present

## 2016-11-16 DIAGNOSIS — I2510 Atherosclerotic heart disease of native coronary artery without angina pectoris: Secondary | ICD-10-CM | POA: Diagnosis not present

## 2016-11-16 DIAGNOSIS — I5020 Unspecified systolic (congestive) heart failure: Secondary | ICD-10-CM | POA: Diagnosis not present

## 2016-11-16 DIAGNOSIS — I480 Paroxysmal atrial fibrillation: Secondary | ICD-10-CM | POA: Diagnosis not present

## 2016-11-17 ENCOUNTER — Other Ambulatory Visit: Payer: Medicare HMO

## 2016-11-18 ENCOUNTER — Other Ambulatory Visit
Admission: RE | Admit: 2016-11-18 | Discharge: 2016-11-18 | Disposition: A | Discharge status: Home / Self Care | Payer: Medicare HMO | Source: Ambulatory Visit | Attending: Family Medicine | Admitting: Family Medicine

## 2016-11-18 ENCOUNTER — Ambulatory Visit (INDEPENDENT_AMBULATORY_CARE_PROVIDER_SITE_OTHER): Payer: Medicare HMO | Admitting: Nurse Practitioner

## 2016-11-18 ENCOUNTER — Telehealth: Payer: Self-pay

## 2016-11-18 ENCOUNTER — Encounter: Payer: Self-pay | Admitting: Nurse Practitioner

## 2016-11-18 ENCOUNTER — Other Ambulatory Visit: Payer: Self-pay

## 2016-11-18 VITALS — BP 100/70 | HR 48 | Ht 71.0 in | Wt 242.5 lb

## 2016-11-18 DIAGNOSIS — G40909 Epilepsy, unspecified, not intractable, without status epilepticus: Secondary | ICD-10-CM | POA: Diagnosis not present

## 2016-11-18 DIAGNOSIS — I428 Other cardiomyopathies: Secondary | ICD-10-CM | POA: Diagnosis not present

## 2016-11-18 DIAGNOSIS — K8020 Calculus of gallbladder without cholecystitis without obstruction: Secondary | ICD-10-CM | POA: Diagnosis not present

## 2016-11-18 DIAGNOSIS — J449 Chronic obstructive pulmonary disease, unspecified: Secondary | ICD-10-CM | POA: Diagnosis not present

## 2016-11-18 DIAGNOSIS — R001 Bradycardia, unspecified: Secondary | ICD-10-CM

## 2016-11-18 DIAGNOSIS — I5020 Unspecified systolic (congestive) heart failure: Secondary | ICD-10-CM | POA: Diagnosis not present

## 2016-11-18 DIAGNOSIS — E119 Type 2 diabetes mellitus without complications: Secondary | ICD-10-CM | POA: Diagnosis not present

## 2016-11-18 DIAGNOSIS — I480 Paroxysmal atrial fibrillation: Secondary | ICD-10-CM | POA: Diagnosis not present

## 2016-11-18 DIAGNOSIS — I110 Hypertensive heart disease with heart failure: Secondary | ICD-10-CM | POA: Diagnosis not present

## 2016-11-18 DIAGNOSIS — I2510 Atherosclerotic heart disease of native coronary artery without angina pectoris: Secondary | ICD-10-CM | POA: Diagnosis not present

## 2016-11-18 DIAGNOSIS — M6281 Muscle weakness (generalized): Secondary | ICD-10-CM | POA: Diagnosis not present

## 2016-11-18 DIAGNOSIS — G250 Essential tremor: Secondary | ICD-10-CM | POA: Diagnosis not present

## 2016-11-18 DIAGNOSIS — I10 Essential (primary) hypertension: Secondary | ICD-10-CM | POA: Diagnosis not present

## 2016-11-18 DIAGNOSIS — I5042 Chronic combined systolic (congestive) and diastolic (congestive) heart failure: Secondary | ICD-10-CM

## 2016-11-18 LAB — CBC WITH DIFFERENTIAL/PLATELET
Basophils Absolute: 0 10*3/uL (ref 0–0.1)
Basophils Relative: 1 %
EOS ABS: 0.3 10*3/uL (ref 0–0.7)
EOS PCT: 5 %
HCT: 38.1 % — ABNORMAL LOW (ref 40.0–52.0)
Hemoglobin: 12.6 g/dL — ABNORMAL LOW (ref 13.0–18.0)
LYMPHS ABS: 1.6 10*3/uL (ref 1.0–3.6)
Lymphocytes Relative: 28 %
MCH: 34.3 pg — AB (ref 26.0–34.0)
MCHC: 33.1 g/dL (ref 32.0–36.0)
MCV: 103.4 fL — ABNORMAL HIGH (ref 80.0–100.0)
MONO ABS: 0.5 10*3/uL (ref 0.2–1.0)
MONOS PCT: 9 %
Neutro Abs: 3.2 10*3/uL (ref 1.4–6.5)
Neutrophils Relative %: 57 %
PLATELETS: 175 10*3/uL (ref 150–440)
RBC: 3.69 MIL/uL — ABNORMAL LOW (ref 4.40–5.90)
RDW: 12.9 % (ref 11.5–14.5)
WBC: 5.6 10*3/uL (ref 3.8–10.6)

## 2016-11-18 NOTE — Telephone Encounter (Signed)
Reordered cbc because [facility] care called and stated that [facility] could not see orders.

## 2016-11-18 NOTE — Patient Instructions (Signed)
Medication Instructions:  Your physician has recommended you make the following change in your medication:  STOP taking amlodipine   Labwork: none  Testing/Procedures: Your physician has requested that you have an echocardiogram. Echocardiography is a painless test that uses sound waves to create images of your heart. It provides your doctor with information about the size and shape of your heart and how well your heart's chambers and valves are working. This procedure takes approximately one hour. There are no restrictions for this procedure.    Follow-Up: Your physician recommends that you schedule a follow-up appointment in: 1 month with Dr. Fletcher Anon.    Any Other Special Instructions Will Be Listed Below (If Applicable). Please elevate your legs when possible.      If you need a refill on your cardiac medications before your next appointment, please call your pharmacy.

## 2016-11-18 NOTE — Telephone Encounter (Signed)
Yes thanks 

## 2016-11-18 NOTE — Progress Notes (Signed)
Office Visit    Patient Name: Chase Simmons Date of Encounter: 11/18/2016  Primary Care Provider:  Leone Haven, MD Primary Cardiologist:  Jerilynn Mages. Fletcher Anon, MD   Chief Complaint    83 y/o ? with a history of paroxysmal atrial fibrillation, recurrent falls, hypertension, hyperlipidemia, nonobstructive CAD, nonischemic cardiomyopathy, HFrEF, diabetes, and bladder cancer who presents for follow-up.  Past Medical History    Past Medical History:  Diagnosis Date  . Allergy   . Bladder cancer ([place])    a. followed by Dr. Jacqlyn Larsen  . Chronic combined systolic (congestive) and diastolic (congestive) heart failure ([place])    a. 07/2016 Echo: EF 30-35%, sev mid-apicalanteroseptal, ant, inf HK, Gr1 DD.  Marland Kitchen COPD (chronic obstructive pulmonary disease) ([place])   . Hyperlipidemia   . Hypertension   . NICM (nonischemic cardiomyopathy) ([place])    a. 07/2016 Echo: EF 30-35%, sev mid-apicalanteroseptal, ant, inf HK, Gr1 DD, mild MR, mildly dil LA, PASP 89mHg - ? stress induced CM.  .Marland KitchenNonobstructive Coronary atherosclerosis    a. 07/2016 Cath: LM nl, LAD nl, D1 40ost, LCX 245mRCA nl, EF 25-30%.  . Marland KitchenAF (paroxysmal atrial fibrillation) ([facility]   a. Dx 08/2013. CHA2DS2VASc = 6-->no OAC 2/2 h/o falls and nocompliance.  . Tremor, essential    sees [facility] neurologist  . Type 2 diabetes mellitus with complication, without long-term current use of insulin ([place]CT[place]7/30/2014  . Urinary incontinence    Past Surgical History:  Procedure Laterality Date  . bladder cancer    . CATARACT EXTRACTION    . HERNIA REPAIR    . Left Heart Cath and Coronary Angiography N/A 07/11/2016   Performed by ArWellington HampshireMD at [facility]    Allergies  Allergies  Allergen Reactions  . Latex Itching    History of Present Illness    [age]ear old male with the above complex past medical history including paroxysmal atrial fibrillation, falls, hypertension, hyperlipidemia, COPD, combined heart failure, nonischemic  cardiomyopathy, nonobstructive CAD, diabetes, bradycardia, and obesity.  He has never been on anticoagulation in the setting of falls and noncompliance.  In July, he was admitted multiple times with nausea and dizziness.  An echo during one admission showed LV dysfunction with an EF of 30-35% and severe mid apical anteroseptal, anterior, and inferior hypokinesis.  Catheterization showed mild nonobstructive CAD.  It was felt that cardiomyopathy might have been stress-induced.  He has not followed-up with cardiology since then.  He did have several more admissions and ER visits related to dizziness and nausea between July and September.  He says this has since resolved.  From a cardiac standpoint, he says that he has been doing well.  He is not active at all and sits most of the day with his legs in a dependent position.  He denies chest pain or dyspnea but does have some degree of chronic lower extremity swelling.  He was recently taken off of his amlodipine and up to this point, has not noticed much change in lower extremity swelling.  He admits to eating a fair amount of soup and other convenience foods.  He does live by himself and will prepare most of his own meals though his family is close by and brings him some meals as well.  He denies PND, orthopnea, dizziness, syncope, or early satiety.  His biggest complaint today is bilateral knee pain which limits his ambulation.  This is somewhat controlled with as needed Tylenol.  Home Medications  Prior to Admission medications   Medication Sig Start Date End Date Taking? Authorizing Provider  acetaminophen (TYLENOL) 325 MG tablet Take 2 tablets (650 mg total) by mouth every 6 (six) hours as needed for mild pain (or Fever >/= 101). 06/22/15  Yes Gouru, Illene Silver, MD  ALPRAZolam Duanne Moron) 0.25 MG tablet take 1 tablet by mouth every 12 hours if needed for anxiety 07/25/16  Yes Vaughan Basta, MD  amLODipine (NORVASC) 5 MG tablet Take 5 mg by mouth daily.  07/26/16  Yes [provider]  aspirin EC 81 MG tablet Take 1 tablet (81 mg total) by mouth daily. 03/15/16  Yes Cook, Jayce G, DO  atorvastatin (LIPITOR) 80 MG tablet TAKE 1 TABLET ONE TIME DAILY 11/04/15  Yes Cook, Jayce G, DO  blood glucose meter kit and supplies KIT Dispense based on patient and insurance preference. Use up to four times daily as directed. 08/01/16  Yes Cook, Jayce G, DO  fexofenadine (ALLEGRA) 180 MG tablet Take 180 mg by mouth daily as needed for allergies or rhinitis.   Yes [provider]  glimepiride (AMARYL) 4 MG tablet take 1 tablet by mouth WITH BREAKFAST. 07/05/16  Yes Lacinda Axon, Jayce G, DO  lisinopril (PRINIVIL,ZESTRIL) 20 MG tablet take 1 tablet by mouth once daily 10/31/16  Yes Leone Haven, MD  meclizine (ANTIVERT) 12.5 MG tablet take 1 tablet by mouth three times a day if needed for dizziness 10/31/16  Yes Leone Haven, MD  metFORMIN (GLUCOPHAGE) 1000 MG tablet Take 1 tablet (1,000 mg total) 2 (two) times daily with a meal by mouth. 11/15/16  Yes Leone Haven, MD  pantoprazole (PROTONIX) 40 MG tablet Take 1 tablet (40 mg total) by mouth daily. 07/23/16  Yes Gladstone Lighter, MD  PARoxetine (PAXIL) 40 MG tablet take 1 tablet by mouth once daily 10/31/16  Yes Leone Haven, MD  primidone (MYSOLINE) 50 MG tablet Take 1.5 tablets (75 mg total) by mouth 2 (two) times daily. 09/15/16  Yes Cook, Jayce G, DO  tiotropium (SPIRIVA) 18 MCG inhalation capsule Place 18 mcg as needed into inhaler and inhale.    Yes [provider]    Review of Systems    He has had lower extremity swelling even off of amlodipine.  He denies chest pain, palpitations, dyspnea, pnd, orthopnea, n, v, dizziness, syncope, weight gain, or early satiety.  His biggest complaint today is bilateral knee pain, for which he uses as needed Tylenol.  All other systems reviewed and are otherwise negative except as noted above.  Physical Exam    VS:  BP 100/70 (BP  Location: Right Arm, Patient Position: Sitting, Cuff Size: Large)   Pulse (!) 48   Ht '5\' 11"'  (1.803 m)   Wt 242 lb 8 oz (110 kg)   BMI 33.82 kg/m  , BMI Body mass index is 33.82 kg/m. GEN: Obese, well developed, in no acute distress.  HEENT: normal.  Neck: Supple, obese, difficult to gauge JVP.  No bruits.   Cardiac: Irregular and bradycardic with ectopy noted.  No murmurs, rubs, or gallops. No clubbing, cyanosis, 1+ bilateral ankle edema.  Radials/DP/PT 2+ and equal bilaterally.  Respiratory:  Respirations regular and unlabored, clear to auscultation bilaterally. GI: Obese, soft, nontender, nondistended, BS + x 4. MS: no deformity or atrophy. Skin: warm and dry, no rash. Neuro:  Strength and sensation are intact. Psych: Normal affect.  Accessory Clinical Findings    ECG -sinus bradycardia, 48, frequent PVCs with a run of ventricular  bigeminy, rightward axis, IVCD.  Lab Results  Component Value Date   CREATININE 1.09 11/08/2016   BUN 31 (H) 11/08/2016   NA 139 11/08/2016   K 4.6 11/08/2016   CL 104 11/08/2016   CO2 29 11/08/2016    Lab Results  Component Value Date   WBC 7.0 11/08/2016   HGB 12.0 (L) 11/08/2016   HCT 35.0 (L) 11/08/2016   MCV 98.9 11/08/2016   PLT 183 11/08/2016   BNP 51 11/08/16  Assessment & Plan    1.  Nonischemic stress-induced cardiomyopathy/chronic combined systolic and diastolic heart failure: Patient was hospitalized in July and noted to have LV dysfunction with an EF of 30-35% with grade 1 diastolic dysfunction.  Catheterization revealed minimal nonobstructive CAD.  He has been on ACE inhibitor therapy but has not been on beta-blocker in the setting of baseline bradycardia.  Blood pressure today is too soft to consider transitioning to [employer].  He denies any dyspnea or orthopnea but does have ankle edema.  This has persisted despite discontinuation of amlodipine.  His weight is down slightly since October.  Labs earlier this month revealed stable  renal function and a normal BNP.  As he has what sounds at a fair amount of salt in his diet through processed foods and soups, and also sits much of the day, I have recommended that he reduce his sodium intake and keep his legs elevated during the day.  I also recommended that he wear a taller men's sock-to the knee to help with some of the swelling.  I am reluctant to add a diuretic as he has bad knees and weak legs and I fear that that would only lead to either incontinence and frequent falls and injury (he lives by himself).  Same holds true as rationale for not initiating Spironolactone.  I will follow-up an echocardiogram as it has been greater than 40 days since his diagnosis of nonischemic cardiomyopathy.  2.  Paroxysmal atrial fibrillation: Not an oral anticoagulation candidate in the setting of frequent falls and leg weakness.  He denies palpitations.  Today he is in sinus rhythm with frequent PVCs.  As above, recent labs showed normal electrolytes.  No evidence of high-grade heart block and he is asymptomatic.  Continue to avoid AV nodal blocking agents.  3.  Sinus bradycardia: See #2.  4.  Essential hypertension: Amlodipine recently discontinued and yet blood pressure is soft.  He is asymptomatic.  Continue lisinopril in the setting of LV dysfunction and diabetes.  5.  Type 2 diabetes mellitus: This is well controlled with a hemoglobin A1c of 6.4 on November 6.  He remains on metformin therapy.  6.  Disposition: Follow-up echocardiogram.  Follow-up in clinic in 1 month.   Murray Hodgkins, NP 11/18/2016, 3:24 PM

## 2016-11-21 DIAGNOSIS — I480 Paroxysmal atrial fibrillation: Secondary | ICD-10-CM | POA: Diagnosis not present

## 2016-11-21 DIAGNOSIS — G250 Essential tremor: Secondary | ICD-10-CM | POA: Diagnosis not present

## 2016-11-21 DIAGNOSIS — M6281 Muscle weakness (generalized): Secondary | ICD-10-CM | POA: Diagnosis not present

## 2016-11-21 DIAGNOSIS — I2510 Atherosclerotic heart disease of native coronary artery without angina pectoris: Secondary | ICD-10-CM | POA: Diagnosis not present

## 2016-11-21 DIAGNOSIS — E119 Type 2 diabetes mellitus without complications: Secondary | ICD-10-CM | POA: Diagnosis not present

## 2016-11-21 DIAGNOSIS — I110 Hypertensive heart disease with heart failure: Secondary | ICD-10-CM | POA: Diagnosis not present

## 2016-11-21 DIAGNOSIS — K8020 Calculus of gallbladder without cholecystitis without obstruction: Secondary | ICD-10-CM | POA: Diagnosis not present

## 2016-11-21 DIAGNOSIS — I5020 Unspecified systolic (congestive) heart failure: Secondary | ICD-10-CM | POA: Diagnosis not present

## 2016-11-21 DIAGNOSIS — J449 Chronic obstructive pulmonary disease, unspecified: Secondary | ICD-10-CM | POA: Diagnosis not present

## 2016-11-21 DIAGNOSIS — G40909 Epilepsy, unspecified, not intractable, without status epilepticus: Secondary | ICD-10-CM | POA: Diagnosis not present

## 2016-11-22 ENCOUNTER — Telehealth: Payer: Self-pay | Admitting: Family Medicine

## 2016-11-22 ENCOUNTER — Telehealth: Payer: Self-pay

## 2016-11-22 DIAGNOSIS — I480 Paroxysmal atrial fibrillation: Secondary | ICD-10-CM | POA: Diagnosis not present

## 2016-11-22 DIAGNOSIS — E119 Type 2 diabetes mellitus without complications: Secondary | ICD-10-CM | POA: Diagnosis not present

## 2016-11-22 DIAGNOSIS — J449 Chronic obstructive pulmonary disease, unspecified: Secondary | ICD-10-CM | POA: Diagnosis not present

## 2016-11-22 DIAGNOSIS — K8020 Calculus of gallbladder without cholecystitis without obstruction: Secondary | ICD-10-CM | POA: Diagnosis not present

## 2016-11-22 DIAGNOSIS — I110 Hypertensive heart disease with heart failure: Secondary | ICD-10-CM | POA: Diagnosis not present

## 2016-11-22 DIAGNOSIS — M6281 Muscle weakness (generalized): Secondary | ICD-10-CM | POA: Diagnosis not present

## 2016-11-22 DIAGNOSIS — I2510 Atherosclerotic heart disease of native coronary artery without angina pectoris: Secondary | ICD-10-CM | POA: Diagnosis not present

## 2016-11-22 DIAGNOSIS — I5020 Unspecified systolic (congestive) heart failure: Secondary | ICD-10-CM | POA: Diagnosis not present

## 2016-11-22 DIAGNOSIS — G40909 Epilepsy, unspecified, not intractable, without status epilepticus: Secondary | ICD-10-CM | POA: Diagnosis not present

## 2016-11-22 DIAGNOSIS — G250 Essential tremor: Secondary | ICD-10-CM | POA: Diagnosis not present

## 2016-11-22 NOTE — Telephone Encounter (Signed)
[place] with me. Please place any necessary orders.  Algis Greenhouse. Jerline Pain, MD 11/22/2016 2:54 PM

## 2016-11-22 NOTE — Telephone Encounter (Signed)
Copied from [place] #[id number]. Topic: General - Other >> Nov 22, 2016 11:05 AM Neva Seat wrote: [facility] [phone]  Requesting Occupational Therapy for 2x's a week for 4 weeks

## 2016-11-22 NOTE — Telephone Encounter (Signed)
Verbal given 

## 2016-11-22 NOTE — Telephone Encounter (Signed)
Please advise 

## 2016-11-22 NOTE — Telephone Encounter (Unsigned)
Copied from [place] #[id number]. >> Nov 22, 2016 11:05 AM Neva Seat wrote: [facility] [phone]  Requesting Occupational Therapy for 2x's a week for 4 weeks

## 2016-11-25 DIAGNOSIS — K8020 Calculus of gallbladder without cholecystitis without obstruction: Secondary | ICD-10-CM | POA: Diagnosis not present

## 2016-11-25 DIAGNOSIS — E119 Type 2 diabetes mellitus without complications: Secondary | ICD-10-CM | POA: Diagnosis not present

## 2016-11-25 DIAGNOSIS — I5020 Unspecified systolic (congestive) heart failure: Secondary | ICD-10-CM | POA: Diagnosis not present

## 2016-11-25 DIAGNOSIS — M6281 Muscle weakness (generalized): Secondary | ICD-10-CM | POA: Diagnosis not present

## 2016-11-25 DIAGNOSIS — I2510 Atherosclerotic heart disease of native coronary artery without angina pectoris: Secondary | ICD-10-CM | POA: Diagnosis not present

## 2016-11-25 DIAGNOSIS — G250 Essential tremor: Secondary | ICD-10-CM | POA: Diagnosis not present

## 2016-11-25 DIAGNOSIS — J449 Chronic obstructive pulmonary disease, unspecified: Secondary | ICD-10-CM | POA: Diagnosis not present

## 2016-11-25 DIAGNOSIS — I110 Hypertensive heart disease with heart failure: Secondary | ICD-10-CM | POA: Diagnosis not present

## 2016-11-25 DIAGNOSIS — I480 Paroxysmal atrial fibrillation: Secondary | ICD-10-CM | POA: Diagnosis not present

## 2016-11-25 DIAGNOSIS — G40909 Epilepsy, unspecified, not intractable, without status epilepticus: Secondary | ICD-10-CM | POA: Diagnosis not present

## 2016-11-29 ENCOUNTER — Telehealth: Payer: Self-pay | Admitting: Family Medicine

## 2016-11-29 NOTE — Telephone Encounter (Signed)
Copied from [place]. Topic: Quick Communication - See Telephone Encounter >> Nov 29, 2016  8:27 AM Robina Ade, Helene Kelp D wrote: CRM for notification. See Telephone encounter for: 11/29/16. Arvil Chaco from [facility] called she would like a verbal order for patient for home health aid 2x a week for 2 weeks and 1x a week for 1 week. Her number is [phone].

## 2016-11-29 NOTE — Telephone Encounter (Signed)
Ok to give verbals 

## 2016-11-29 NOTE — Telephone Encounter (Signed)
Order can be given.

## 2016-11-29 NOTE — Telephone Encounter (Signed)
Verbals given to [employer]

## 2016-11-29 NOTE — Telephone Encounter (Signed)
Verbal order can be given.  

## 2016-12-01 DIAGNOSIS — I480 Paroxysmal atrial fibrillation: Secondary | ICD-10-CM | POA: Diagnosis not present

## 2016-12-01 DIAGNOSIS — E119 Type 2 diabetes mellitus without complications: Secondary | ICD-10-CM | POA: Diagnosis not present

## 2016-12-01 DIAGNOSIS — G40909 Epilepsy, unspecified, not intractable, without status epilepticus: Secondary | ICD-10-CM | POA: Diagnosis not present

## 2016-12-01 DIAGNOSIS — I5020 Unspecified systolic (congestive) heart failure: Secondary | ICD-10-CM | POA: Diagnosis not present

## 2016-12-01 DIAGNOSIS — M6281 Muscle weakness (generalized): Secondary | ICD-10-CM | POA: Diagnosis not present

## 2016-12-01 DIAGNOSIS — J449 Chronic obstructive pulmonary disease, unspecified: Secondary | ICD-10-CM | POA: Diagnosis not present

## 2016-12-01 DIAGNOSIS — I2510 Atherosclerotic heart disease of native coronary artery without angina pectoris: Secondary | ICD-10-CM | POA: Diagnosis not present

## 2016-12-01 DIAGNOSIS — G250 Essential tremor: Secondary | ICD-10-CM | POA: Diagnosis not present

## 2016-12-01 DIAGNOSIS — K8020 Calculus of gallbladder without cholecystitis without obstruction: Secondary | ICD-10-CM | POA: Diagnosis not present

## 2016-12-01 DIAGNOSIS — I110 Hypertensive heart disease with heart failure: Secondary | ICD-10-CM | POA: Diagnosis not present

## 2016-12-02 DIAGNOSIS — I110 Hypertensive heart disease with heart failure: Secondary | ICD-10-CM | POA: Diagnosis not present

## 2016-12-02 DIAGNOSIS — M6281 Muscle weakness (generalized): Secondary | ICD-10-CM | POA: Diagnosis not present

## 2016-12-02 DIAGNOSIS — I5020 Unspecified systolic (congestive) heart failure: Secondary | ICD-10-CM | POA: Diagnosis not present

## 2016-12-02 DIAGNOSIS — K8020 Calculus of gallbladder without cholecystitis without obstruction: Secondary | ICD-10-CM | POA: Diagnosis not present

## 2016-12-02 DIAGNOSIS — I2510 Atherosclerotic heart disease of native coronary artery without angina pectoris: Secondary | ICD-10-CM | POA: Diagnosis not present

## 2016-12-02 DIAGNOSIS — G40909 Epilepsy, unspecified, not intractable, without status epilepticus: Secondary | ICD-10-CM | POA: Diagnosis not present

## 2016-12-02 DIAGNOSIS — E119 Type 2 diabetes mellitus without complications: Secondary | ICD-10-CM | POA: Diagnosis not present

## 2016-12-02 DIAGNOSIS — J449 Chronic obstructive pulmonary disease, unspecified: Secondary | ICD-10-CM | POA: Diagnosis not present

## 2016-12-02 DIAGNOSIS — G250 Essential tremor: Secondary | ICD-10-CM | POA: Diagnosis not present

## 2016-12-02 DIAGNOSIS — I480 Paroxysmal atrial fibrillation: Secondary | ICD-10-CM | POA: Diagnosis not present

## 2016-12-03 ENCOUNTER — Other Ambulatory Visit: Payer: Self-pay | Admitting: Family Medicine

## 2016-12-03 DIAGNOSIS — D539 Nutritional anemia, unspecified: Secondary | ICD-10-CM

## 2016-12-03 NOTE — Progress Notes (Signed)
fobt

## 2016-12-07 ENCOUNTER — Other Ambulatory Visit: Payer: Self-pay | Admitting: Family Medicine

## 2016-12-07 DIAGNOSIS — I480 Paroxysmal atrial fibrillation: Secondary | ICD-10-CM | POA: Diagnosis not present

## 2016-12-07 DIAGNOSIS — K8020 Calculus of gallbladder without cholecystitis without obstruction: Secondary | ICD-10-CM | POA: Diagnosis not present

## 2016-12-07 DIAGNOSIS — I110 Hypertensive heart disease with heart failure: Secondary | ICD-10-CM | POA: Diagnosis not present

## 2016-12-07 DIAGNOSIS — E119 Type 2 diabetes mellitus without complications: Secondary | ICD-10-CM | POA: Diagnosis not present

## 2016-12-07 DIAGNOSIS — I5020 Unspecified systolic (congestive) heart failure: Secondary | ICD-10-CM | POA: Diagnosis not present

## 2016-12-07 DIAGNOSIS — J449 Chronic obstructive pulmonary disease, unspecified: Secondary | ICD-10-CM | POA: Diagnosis not present

## 2016-12-07 DIAGNOSIS — M6281 Muscle weakness (generalized): Secondary | ICD-10-CM | POA: Diagnosis not present

## 2016-12-07 DIAGNOSIS — G250 Essential tremor: Secondary | ICD-10-CM | POA: Diagnosis not present

## 2016-12-07 DIAGNOSIS — G40909 Epilepsy, unspecified, not intractable, without status epilepticus: Secondary | ICD-10-CM | POA: Diagnosis not present

## 2016-12-07 DIAGNOSIS — I2510 Atherosclerotic heart disease of native coronary artery without angina pectoris: Secondary | ICD-10-CM | POA: Diagnosis not present

## 2016-12-08 ENCOUNTER — Other Ambulatory Visit: Payer: Medicare HMO

## 2016-12-09 DIAGNOSIS — K8020 Calculus of gallbladder without cholecystitis without obstruction: Secondary | ICD-10-CM | POA: Diagnosis not present

## 2016-12-09 DIAGNOSIS — E119 Type 2 diabetes mellitus without complications: Secondary | ICD-10-CM | POA: Diagnosis not present

## 2016-12-09 DIAGNOSIS — J449 Chronic obstructive pulmonary disease, unspecified: Secondary | ICD-10-CM | POA: Diagnosis not present

## 2016-12-09 DIAGNOSIS — G250 Essential tremor: Secondary | ICD-10-CM | POA: Diagnosis not present

## 2016-12-09 DIAGNOSIS — I110 Hypertensive heart disease with heart failure: Secondary | ICD-10-CM | POA: Diagnosis not present

## 2016-12-09 DIAGNOSIS — G40909 Epilepsy, unspecified, not intractable, without status epilepticus: Secondary | ICD-10-CM | POA: Diagnosis not present

## 2016-12-09 DIAGNOSIS — M6281 Muscle weakness (generalized): Secondary | ICD-10-CM | POA: Diagnosis not present

## 2016-12-09 DIAGNOSIS — I480 Paroxysmal atrial fibrillation: Secondary | ICD-10-CM | POA: Diagnosis not present

## 2016-12-09 DIAGNOSIS — I5020 Unspecified systolic (congestive) heart failure: Secondary | ICD-10-CM | POA: Diagnosis not present

## 2016-12-09 DIAGNOSIS — I2510 Atherosclerotic heart disease of native coronary artery without angina pectoris: Secondary | ICD-10-CM | POA: Diagnosis not present

## 2016-12-12 DIAGNOSIS — I2510 Atherosclerotic heart disease of native coronary artery without angina pectoris: Secondary | ICD-10-CM | POA: Diagnosis not present

## 2016-12-12 DIAGNOSIS — I480 Paroxysmal atrial fibrillation: Secondary | ICD-10-CM | POA: Diagnosis not present

## 2016-12-12 DIAGNOSIS — E119 Type 2 diabetes mellitus without complications: Secondary | ICD-10-CM | POA: Diagnosis not present

## 2016-12-12 DIAGNOSIS — G250 Essential tremor: Secondary | ICD-10-CM | POA: Diagnosis not present

## 2016-12-12 DIAGNOSIS — M6281 Muscle weakness (generalized): Secondary | ICD-10-CM | POA: Diagnosis not present

## 2016-12-12 DIAGNOSIS — I5020 Unspecified systolic (congestive) heart failure: Secondary | ICD-10-CM | POA: Diagnosis not present

## 2016-12-12 DIAGNOSIS — J449 Chronic obstructive pulmonary disease, unspecified: Secondary | ICD-10-CM | POA: Diagnosis not present

## 2016-12-12 DIAGNOSIS — K8020 Calculus of gallbladder without cholecystitis without obstruction: Secondary | ICD-10-CM | POA: Diagnosis not present

## 2016-12-12 DIAGNOSIS — I110 Hypertensive heart disease with heart failure: Secondary | ICD-10-CM | POA: Diagnosis not present

## 2016-12-12 DIAGNOSIS — G40909 Epilepsy, unspecified, not intractable, without status epilepticus: Secondary | ICD-10-CM | POA: Diagnosis not present

## 2016-12-16 DIAGNOSIS — I110 Hypertensive heart disease with heart failure: Secondary | ICD-10-CM | POA: Diagnosis not present

## 2016-12-16 DIAGNOSIS — G3184 Mild cognitive impairment, so stated: Secondary | ICD-10-CM | POA: Diagnosis not present

## 2016-12-16 DIAGNOSIS — K219 Gastro-esophageal reflux disease without esophagitis: Secondary | ICD-10-CM | POA: Diagnosis not present

## 2016-12-16 DIAGNOSIS — J449 Chronic obstructive pulmonary disease, unspecified: Secondary | ICD-10-CM | POA: Diagnosis not present

## 2016-12-16 DIAGNOSIS — M130 Polyarthritis, unspecified: Secondary | ICD-10-CM | POA: Diagnosis not present

## 2016-12-16 DIAGNOSIS — R69 Illness, unspecified: Secondary | ICD-10-CM | POA: Diagnosis not present

## 2016-12-16 DIAGNOSIS — I2510 Atherosclerotic heart disease of native coronary artery without angina pectoris: Secondary | ICD-10-CM | POA: Diagnosis not present

## 2016-12-16 DIAGNOSIS — E785 Hyperlipidemia, unspecified: Secondary | ICD-10-CM | POA: Diagnosis not present

## 2016-12-16 DIAGNOSIS — G250 Essential tremor: Secondary | ICD-10-CM | POA: Diagnosis not present

## 2016-12-16 DIAGNOSIS — E119 Type 2 diabetes mellitus without complications: Secondary | ICD-10-CM | POA: Diagnosis not present

## 2016-12-16 DIAGNOSIS — G40909 Epilepsy, unspecified, not intractable, without status epilepticus: Secondary | ICD-10-CM | POA: Diagnosis not present

## 2016-12-16 DIAGNOSIS — Z0000 Encounter for general adult medical examination without abnormal findings: Secondary | ICD-10-CM | POA: Diagnosis not present

## 2016-12-16 DIAGNOSIS — I10 Essential (primary) hypertension: Secondary | ICD-10-CM | POA: Diagnosis not present

## 2016-12-16 DIAGNOSIS — I480 Paroxysmal atrial fibrillation: Secondary | ICD-10-CM | POA: Diagnosis not present

## 2016-12-16 DIAGNOSIS — I5020 Unspecified systolic (congestive) heart failure: Secondary | ICD-10-CM | POA: Diagnosis not present

## 2016-12-16 DIAGNOSIS — K8020 Calculus of gallbladder without cholecystitis without obstruction: Secondary | ICD-10-CM | POA: Diagnosis not present

## 2016-12-16 DIAGNOSIS — M6281 Muscle weakness (generalized): Secondary | ICD-10-CM | POA: Diagnosis not present

## 2016-12-19 DIAGNOSIS — M6281 Muscle weakness (generalized): Secondary | ICD-10-CM | POA: Diagnosis not present

## 2016-12-19 DIAGNOSIS — G250 Essential tremor: Secondary | ICD-10-CM | POA: Diagnosis not present

## 2016-12-19 DIAGNOSIS — I480 Paroxysmal atrial fibrillation: Secondary | ICD-10-CM | POA: Diagnosis not present

## 2016-12-19 DIAGNOSIS — I110 Hypertensive heart disease with heart failure: Secondary | ICD-10-CM | POA: Diagnosis not present

## 2016-12-19 DIAGNOSIS — I2510 Atherosclerotic heart disease of native coronary artery without angina pectoris: Secondary | ICD-10-CM | POA: Diagnosis not present

## 2016-12-19 DIAGNOSIS — K8020 Calculus of gallbladder without cholecystitis without obstruction: Secondary | ICD-10-CM | POA: Diagnosis not present

## 2016-12-19 DIAGNOSIS — I5020 Unspecified systolic (congestive) heart failure: Secondary | ICD-10-CM | POA: Diagnosis not present

## 2016-12-19 DIAGNOSIS — E119 Type 2 diabetes mellitus without complications: Secondary | ICD-10-CM | POA: Diagnosis not present

## 2016-12-19 DIAGNOSIS — G40909 Epilepsy, unspecified, not intractable, without status epilepticus: Secondary | ICD-10-CM | POA: Diagnosis not present

## 2016-12-19 DIAGNOSIS — J449 Chronic obstructive pulmonary disease, unspecified: Secondary | ICD-10-CM | POA: Diagnosis not present

## 2016-12-20 ENCOUNTER — Telehealth: Payer: Self-pay | Admitting: Family Medicine

## 2016-12-20 ENCOUNTER — Other Ambulatory Visit: Payer: Self-pay

## 2016-12-20 MED ORDER — LISINOPRIL 20 MG PO TABS: 20.0000 mg | ORAL_TABLET | Freq: Every day | ORAL | 1 refills | Status: DC

## 2016-12-20 MED ORDER — PANTOPRAZOLE SODIUM 40 MG PO TBEC: 40.0000 mg | DELAYED_RELEASE_TABLET | Freq: Every day | ORAL | 2 refills | Status: DC

## 2016-12-20 MED ORDER — PAROXETINE HCL 40 MG PO TABS: 40.0000 mg | ORAL_TABLET | Freq: Every day | ORAL | 1 refills | Status: DC

## 2016-12-20 NOTE — Telephone Encounter (Unsigned)
Copied from [place]. Topic: General - Other >> Dec 20, 2016  2:51 PM Neva Seat wrote:  [facility] [phone]  Verbal Orders:  OT  for 2 times a week for 3 weeks- 1 time a week for 1 week [place]  for 2 times a week for 1 week - 1 time a week for 2 weeks

## 2016-12-20 NOTE — Telephone Encounter (Signed)
Please advise 

## 2016-12-20 NOTE — Telephone Encounter (Signed)
Refills sent to pharmacy. Please confirm what he takes the protonix for. Also confirm the paxil is for depression.  Thanks.

## 2016-12-20 NOTE — Telephone Encounter (Signed)
Verbal orders can be given. 

## 2016-12-20 NOTE — Telephone Encounter (Signed)
protonix last filled by Dr.Kalisetti 07/23/16 30 2rf, [facility]

## 2016-12-21 DIAGNOSIS — J449 Chronic obstructive pulmonary disease, unspecified: Secondary | ICD-10-CM | POA: Diagnosis not present

## 2016-12-21 DIAGNOSIS — M6281 Muscle weakness (generalized): Secondary | ICD-10-CM | POA: Diagnosis not present

## 2016-12-21 DIAGNOSIS — I5020 Unspecified systolic (congestive) heart failure: Secondary | ICD-10-CM | POA: Diagnosis not present

## 2016-12-21 DIAGNOSIS — K8020 Calculus of gallbladder without cholecystitis without obstruction: Secondary | ICD-10-CM | POA: Diagnosis not present

## 2016-12-21 DIAGNOSIS — I110 Hypertensive heart disease with heart failure: Secondary | ICD-10-CM | POA: Diagnosis not present

## 2016-12-21 DIAGNOSIS — G250 Essential tremor: Secondary | ICD-10-CM | POA: Diagnosis not present

## 2016-12-21 DIAGNOSIS — E119 Type 2 diabetes mellitus without complications: Secondary | ICD-10-CM | POA: Diagnosis not present

## 2016-12-21 DIAGNOSIS — G40909 Epilepsy, unspecified, not intractable, without status epilepticus: Secondary | ICD-10-CM | POA: Diagnosis not present

## 2016-12-21 DIAGNOSIS — I480 Paroxysmal atrial fibrillation: Secondary | ICD-10-CM | POA: Diagnosis not present

## 2016-12-21 DIAGNOSIS — I2510 Atherosclerotic heart disease of native coronary artery without angina pectoris: Secondary | ICD-10-CM | POA: Diagnosis not present

## 2016-12-21 NOTE — Telephone Encounter (Signed)
Patient called back- he is not sure why he was originally Rx'd the Protonix- but he does want to continue it. He states he can discuss stopping it at his next appointment if his provider feels he could. As for the Paxil- Dr Nicki Reaper gave him that for a sleep disturbance he was having- he stated he was "obsessing over his toe to the point he was not sleeping"- so she Rx'd the Paxil for that and it helped.  Patient states his daughter handles all of his medications- he seemed a little unsure of things.

## 2016-12-21 NOTE — Telephone Encounter (Signed)
Left message to return call to ask what he takes the protonix for and if he takes paxil for depression, ok for pec to speak to patient

## 2016-12-21 NOTE — Telephone Encounter (Signed)
Verbal orders given  

## 2016-12-22 NOTE — Telephone Encounter (Signed)
fyi

## 2016-12-23 ENCOUNTER — Telehealth: Payer: Self-pay | Admitting: Family Medicine

## 2016-12-23 MED ORDER — PANTOPRAZOLE SODIUM 40 MG PO TBEC: 40.0000 mg | DELAYED_RELEASE_TABLET | Freq: Every day | ORAL | 1 refills | Status: DC

## 2016-12-23 MED ORDER — LISINOPRIL 20 MG PO TABS: 20.0000 mg | ORAL_TABLET | Freq: Every day | ORAL | 1 refills | Status: DC

## 2016-12-23 MED ORDER — GLIMEPIRIDE 4 MG PO TABS: ORAL_TABLET | ORAL | 1 refills | Status: DC

## 2016-12-23 MED ORDER — PAROXETINE HCL 40 MG PO TABS: 40.0000 mg | ORAL_TABLET | Freq: Every day | ORAL | 1 refills | Status: DC

## 2016-12-23 NOTE — Telephone Encounter (Signed)
Sent to pharmacy 

## 2016-12-23 NOTE — Telephone Encounter (Signed)
Changed pharmacy

## 2016-12-23 NOTE — Telephone Encounter (Signed)
Pt is [facility] [address]. Please change and pt's daughter is requesting that all medications that are due for refill [facility].   Pt's daughter name is Insurance account manager and she was given a DPR today that she is going to have her father sign. [name]'s phone number is [phone].

## 2016-12-23 NOTE — Telephone Encounter (Signed)
Pt will need the following medications refilled, please.  PARoxetine (PAXIL) 40 MG tablet- needs by next week.  lisinopril (PRINIVIL,ZESTRIL) 20 MG tablet  glimepiride (AMARYL) 4 MG tablet  pantoprazole (PROTONIX) 40 MG tablet  Is it possible to have 90 day supplies on these medications? Pt would like to have a 90 day supply.

## 2016-12-28 DIAGNOSIS — E119 Type 2 diabetes mellitus without complications: Secondary | ICD-10-CM | POA: Diagnosis not present

## 2016-12-28 DIAGNOSIS — I110 Hypertensive heart disease with heart failure: Secondary | ICD-10-CM | POA: Diagnosis not present

## 2016-12-28 DIAGNOSIS — M6281 Muscle weakness (generalized): Secondary | ICD-10-CM | POA: Diagnosis not present

## 2016-12-28 DIAGNOSIS — K8020 Calculus of gallbladder without cholecystitis without obstruction: Secondary | ICD-10-CM | POA: Diagnosis not present

## 2016-12-28 DIAGNOSIS — G250 Essential tremor: Secondary | ICD-10-CM | POA: Diagnosis not present

## 2016-12-28 DIAGNOSIS — I480 Paroxysmal atrial fibrillation: Secondary | ICD-10-CM | POA: Diagnosis not present

## 2016-12-28 DIAGNOSIS — I5020 Unspecified systolic (congestive) heart failure: Secondary | ICD-10-CM | POA: Diagnosis not present

## 2016-12-28 DIAGNOSIS — I2510 Atherosclerotic heart disease of native coronary artery without angina pectoris: Secondary | ICD-10-CM | POA: Diagnosis not present

## 2016-12-28 DIAGNOSIS — G40909 Epilepsy, unspecified, not intractable, without status epilepticus: Secondary | ICD-10-CM | POA: Diagnosis not present

## 2016-12-28 DIAGNOSIS — J449 Chronic obstructive pulmonary disease, unspecified: Secondary | ICD-10-CM | POA: Diagnosis not present

## 2016-12-30 DIAGNOSIS — E119 Type 2 diabetes mellitus without complications: Secondary | ICD-10-CM | POA: Diagnosis not present

## 2016-12-30 DIAGNOSIS — I480 Paroxysmal atrial fibrillation: Secondary | ICD-10-CM | POA: Diagnosis not present

## 2016-12-30 DIAGNOSIS — G250 Essential tremor: Secondary | ICD-10-CM | POA: Diagnosis not present

## 2016-12-30 DIAGNOSIS — K8020 Calculus of gallbladder without cholecystitis without obstruction: Secondary | ICD-10-CM | POA: Diagnosis not present

## 2016-12-30 DIAGNOSIS — G40909 Epilepsy, unspecified, not intractable, without status epilepticus: Secondary | ICD-10-CM | POA: Diagnosis not present

## 2016-12-30 DIAGNOSIS — I110 Hypertensive heart disease with heart failure: Secondary | ICD-10-CM | POA: Diagnosis not present

## 2016-12-30 DIAGNOSIS — J449 Chronic obstructive pulmonary disease, unspecified: Secondary | ICD-10-CM | POA: Diagnosis not present

## 2016-12-30 DIAGNOSIS — M6281 Muscle weakness (generalized): Secondary | ICD-10-CM | POA: Diagnosis not present

## 2016-12-30 DIAGNOSIS — I2510 Atherosclerotic heart disease of native coronary artery without angina pectoris: Secondary | ICD-10-CM | POA: Diagnosis not present

## 2016-12-30 DIAGNOSIS — I5020 Unspecified systolic (congestive) heart failure: Secondary | ICD-10-CM | POA: Diagnosis not present

## 2016-12-31 ENCOUNTER — Emergency Department
Admission: EM | Admit: 2016-12-31 | Discharge: 2016-12-31 | Disposition: A | Discharge status: Home / Self Care | Payer: Medicare HMO | Attending: Emergency Medicine | Admitting: Emergency Medicine

## 2016-12-31 ENCOUNTER — Emergency Department: Payer: Medicare HMO

## 2016-12-31 ENCOUNTER — Other Ambulatory Visit: Payer: Self-pay

## 2016-12-31 DIAGNOSIS — Z7982 Long term (current) use of aspirin: Secondary | ICD-10-CM | POA: Insufficient documentation

## 2016-12-31 DIAGNOSIS — R42 Dizziness and giddiness: Secondary | ICD-10-CM | POA: Diagnosis not present

## 2016-12-31 DIAGNOSIS — R531 Weakness: Secondary | ICD-10-CM | POA: Diagnosis not present

## 2016-12-31 DIAGNOSIS — I110 Hypertensive heart disease with heart failure: Secondary | ICD-10-CM | POA: Diagnosis not present

## 2016-12-31 DIAGNOSIS — Z79899 Other long term (current) drug therapy: Secondary | ICD-10-CM | POA: Insufficient documentation

## 2016-12-31 DIAGNOSIS — K8020 Calculus of gallbladder without cholecystitis without obstruction: Secondary | ICD-10-CM | POA: Diagnosis not present

## 2016-12-31 DIAGNOSIS — E119 Type 2 diabetes mellitus without complications: Secondary | ICD-10-CM | POA: Insufficient documentation

## 2016-12-31 DIAGNOSIS — Z7984 Long term (current) use of oral hypoglycemic drugs: Secondary | ICD-10-CM | POA: Diagnosis not present

## 2016-12-31 DIAGNOSIS — J449 Chronic obstructive pulmonary disease, unspecified: Secondary | ICD-10-CM | POA: Insufficient documentation

## 2016-12-31 DIAGNOSIS — I509 Heart failure, unspecified: Secondary | ICD-10-CM | POA: Diagnosis not present

## 2016-12-31 DIAGNOSIS — I480 Paroxysmal atrial fibrillation: Secondary | ICD-10-CM | POA: Diagnosis not present

## 2016-12-31 DIAGNOSIS — M6281 Muscle weakness (generalized): Secondary | ICD-10-CM | POA: Diagnosis not present

## 2016-12-31 DIAGNOSIS — Z87891 Personal history of nicotine dependence: Secondary | ICD-10-CM | POA: Insufficient documentation

## 2016-12-31 DIAGNOSIS — I5020 Unspecified systolic (congestive) heart failure: Secondary | ICD-10-CM | POA: Diagnosis not present

## 2016-12-31 DIAGNOSIS — G250 Essential tremor: Secondary | ICD-10-CM | POA: Diagnosis not present

## 2016-12-31 DIAGNOSIS — G40909 Epilepsy, unspecified, not intractable, without status epilepticus: Secondary | ICD-10-CM | POA: Diagnosis not present

## 2016-12-31 DIAGNOSIS — I2510 Atherosclerotic heart disease of native coronary artery without angina pectoris: Secondary | ICD-10-CM | POA: Diagnosis not present

## 2016-12-31 LAB — COMPREHENSIVE METABOLIC PANEL
ALBUMIN: 3.7 g/dL (ref 3.5–5.0)
ALK PHOS: 82 U/L (ref 38–126)
ALT: 19 U/L (ref 17–63)
ANION GAP: 12 (ref 5–15)
AST: 28 U/L (ref 15–41)
BILIRUBIN TOTAL: 0.9 mg/dL (ref 0.3–1.2)
BUN: 24 mg/dL — ABNORMAL HIGH (ref 6–20)
CALCIUM: 8.9 mg/dL (ref 8.9–10.3)
CO2: 21 mmol/L — ABNORMAL LOW (ref 22–32)
Chloride: 103 mmol/L (ref 101–111)
Creatinine, Ser: 0.96 mg/dL (ref 0.61–1.24)
Glucose, Bld: 212 mg/dL — ABNORMAL HIGH (ref 65–99)
POTASSIUM: 3.5 mmol/L (ref 3.5–5.1)
Sodium: 136 mmol/L (ref 135–145)
TOTAL PROTEIN: 7.1 g/dL (ref 6.5–8.1)

## 2016-12-31 LAB — URINALYSIS, COMPLETE (UACMP) WITH MICROSCOPIC
BACTERIA UA: NONE SEEN
BILIRUBIN URINE: NEGATIVE
GLUCOSE, UA: 150 mg/dL — AB
KETONES UR: 20 mg/dL — AB
LEUKOCYTES UA: NEGATIVE
NITRITE: NEGATIVE
PH: 7 (ref 5.0–8.0)
Protein, ur: NEGATIVE mg/dL
SPECIFIC GRAVITY, URINE: 1.011 (ref 1.005–1.030)

## 2016-12-31 LAB — CBC
HEMATOCRIT: 38.5 % — AB (ref 40.0–52.0)
HEMOGLOBIN: 13 g/dL (ref 13.0–18.0)
MCH: 34.6 pg — AB (ref 26.0–34.0)
MCHC: 33.7 g/dL (ref 32.0–36.0)
MCV: 102.5 fL — ABNORMAL HIGH (ref 80.0–100.0)
Platelets: 148 10*3/uL — ABNORMAL LOW (ref 150–440)
RBC: 3.76 MIL/uL — ABNORMAL LOW (ref 4.40–5.90)
RDW: 13.5 % (ref 11.5–14.5)
WBC: 9.5 10*3/uL (ref 3.8–10.6)

## 2016-12-31 LAB — TROPONIN I: TROPONIN I: 0.04 ng/mL — AB (ref <0.03)

## 2016-12-31 MED ORDER — SODIUM CHLORIDE 0.9 % IV SOLN
1000.0000 mL | Freq: Once | INTRAVENOUS | Status: AC
  Administered 2016-12-31: 1000 mL via INTRAVENOUS

## 2016-12-31 MED ORDER — ONDANSETRON HCL 4 MG/2ML IJ SOLN
4.0000 mg | Freq: Once | INTRAMUSCULAR | Status: AC
  Administered 2016-12-31: 4 mg via INTRAVENOUS
  Filled 2016-12-31: qty 2

## 2016-12-31 NOTE — ED Notes (Signed)
Report received from Jay RN.

## 2016-12-31 NOTE — ED Notes (Signed)
SW called per MD order. Pending consult.

## 2016-12-31 NOTE — ED Notes (Signed)
Patient's discharge and follow up information reviewed with patient by ED nursing staff and patient given the opportunity to ask questions pertaining to ED visit and discharge plan of care. Patient advised that should symptoms not continue to improve, resolve entirely, or should new symptoms develop then a follow up visit with their PCP or a return visit to the ED may be warranted. Patient verbalized consent and understanding of discharge plan of care including potential need for further evaluation. Patient discharged in stable condition per attending ED physician on duty.   All discharge information reviewed with pt's daughter.

## 2016-12-31 NOTE — ED Provider Notes (Signed)
[facility] Emergency Department Provider Note   ____________________________________________    I have reviewed the triage vital signs and the nursing notes.   HISTORY  Chief Complaint Dizziness (Hx Vertigo, takes Meclazine QD)     HPI Chase Simmons is a 83 y.o. male who presents with complaints of dizziness which he describes this is typical vertigo.  Patient has a long history of vertigo, he did take some meclizine at home which she reports improved her symptoms somewhat.  Patient is wheelchair-bound and uses a walker to transition.  EMS reports there was no food in the house and they are concerned.  Patient denies abdominal pain.  No chest pain.  No palpitations.  No nausea or vomiting.  No headache or neuro deficits   Past Medical History:  Diagnosis Date  . Allergy   . Bladder cancer ([place])    a. followed by Dr. Jacqlyn Larsen  . Chronic combined systolic (congestive) and diastolic (congestive) heart failure ([place])    a. 07/2016 Echo: EF 30-35%, sev mid-apicalanteroseptal, ant, inf HK, Gr1 DD.  Marland Kitchen COPD (chronic obstructive pulmonary disease) ([place])   . Hyperlipidemia   . Hypertension   . NICM (nonischemic cardiomyopathy) ([place])    a. 07/2016 Echo: EF 30-35%, sev mid-apicalanteroseptal, ant, inf HK, Gr1 DD, mild MR, mildly dil LA, PASP 86mHg - ? stress induced CM.  .Marland KitchenNonobstructive Coronary atherosclerosis    a. 07/2016 Cath: LM nl, LAD nl, D1 40ost, LCX 231mRCA nl, EF 25-30%.  . Marland KitchenAF (paroxysmal atrial fibrillation) (HCP[place]   a. Dx 08/2013. CHA2DS2VASc = 6-->no OAC 2/2 h/o falls and nocompliance.  . Tremor, essential    sees [facility] neurologist  . Type 2 diabetes mellitus with complication, without long-term current use of insulin (HCCase[place]7/30/2014  . Urinary incontinence     Patient Active Problem List   Diagnosis Date Noted  . Bilateral leg edema 11/08/2016  . Dizziness 08/25/2016  . Nausea 08/01/2016  . CHF (congestive heart failure) (HCHarvel07/30/2018    . Atypical chest pain 07/20/2016  . COPD (chronic obstructive pulmonary disease) (HCTolland03/13/2018  . Coronary atherosclerosis of native coronary artery 01/05/2016  . Aortic atherosclerosis (HCLackawanna01/02/2016  . Bradycardia 01/05/2016  . Anxiety 06/17/2015  . Malignant neoplasm of urinary bladder (HCYork06/14/2017  . Allergic rhinitis 05/14/2015  . PAF (paroxysmal atrial fibrillation) (HCLarwill08/28/2015  . Acid reflux 08/30/2013  . Tremor, essential 10/22/2012  . Hyperlipidemia 10/22/2012  . HTN (hypertension) 08/01/2012  . Type 2 diabetes mellitus with complication, without long-term current use of insulin (HCAddison07/30/2014  . Benign prostatic hyperplasia with urinary obstruction 09/02/2011    Past Surgical History:  Procedure Laterality Date  . bladder cancer    . CATARACT EXTRACTION    . HERNIA REPAIR    . LEFT HEART CATH AND CORONARY ANGIOGRAPHY N/A 07/11/2016   Procedure: Left Heart Cath and Coronary Angiography;  Surgeon: ArWellington HampshireMD;  [facility];  Service: Cardiovascular;  Laterality: N/A;    Prior to Admission medications   Medication Sig Start Date End Date Taking? Authorizing Provider  acetaminophen (TYLENOL) 325 MG tablet Take 2 tablets (650 mg total) by mouth every 6 (six) hours as needed for mild pain (or Fever >/= 101). 06/22/15   GoNicholes MangoMD  ALPRAZolam (XDuanne Moron0.25 MG tablet take 1 tablet by mouth every 12 hours if needed for anxiety 07/25/16   VaVaughan BastaMD  aspirin EC 81 MG tablet Take 1  tablet (81 mg total) by mouth daily. 03/15/16   Coral Spikes, DO  atorvastatin (LIPITOR) 80 MG tablet TAKE 1 TABLET ONE TIME DAILY 11/04/15   Thersa Salt G, DO  blood glucose meter kit and supplies KIT Dispense based on patient and insurance preference. Use up to four times daily as directed. 08/01/16   Coral Spikes, DO  fexofenadine (ALLEGRA) 180 MG tablet Take 180 mg by mouth daily as needed for allergies or rhinitis.    [provider]  glimepiride (AMARYL) 4 MG tablet take 1 tablet by mouth WITH BREAKFAST. 12/23/16   Leone Haven, MD  lisinopril (PRINIVIL,ZESTRIL) 20 MG tablet Take 1 tablet (20 mg total) by mouth daily. 12/23/16   Leone Haven, MD  meclizine (ANTIVERT) 12.5 MG tablet take 1 tablet by mouth three times a day if needed for dizziness 10/31/16   Leone Haven, MD  metFORMIN (GLUCOPHAGE) 1000 MG tablet Take 1 tablet (1,000 mg total) 2 (two) times daily with a meal by mouth. 11/15/16   Leone Haven, MD  pantoprazole (PROTONIX) 40 MG tablet Take 1 tablet (40 mg total) by mouth daily. 12/23/16   Leone Haven, MD  PARoxetine (PAXIL) 40 MG tablet Take 1 tablet (40 mg total) by mouth daily. 12/23/16   Leone Haven, MD  primidone (MYSOLINE) 50 MG tablet Take 1.5 tablets (75 mg total) by mouth 2 (two) times daily. 09/15/16   Coral Spikes, DO  tiotropium (SPIRIVA) 18 MCG inhalation capsule Place 18 mcg as needed into inhaler and inhale.     [provider]     Allergies Latex  Family History  Problem Relation Age of Onset  . Arthritis Mother   . Dementia Mother   . Cancer Maternal Uncle        prostate  . Cancer Maternal Aunt        breast cancer    Social History Social History   Tobacco Use  . Smoking status: Former Smoker    Packs/day: 1.50    Years: 40.00    Pack years: 60.00  . Smokeless tobacco: Never Used  Substance Use Topics  . Alcohol use: No    Alcohol/week: 7.2 oz    Types: 12 Standard drinks or equivalent per week    Frequency: Never    Comment: 1 drink with dinner  . Drug use: No    Review of Systems  Constitutional: No fever/chills Eyes: No visual changes.  ENT: No sore throat. Cardiovascular: Denies chest pain. Respiratory: Denies shortness of breath. Gastrointestinal: No abdominal pain.  No nausea, no vomiting.  Does report that he had some diarrhea yesterday Genitourinary: Negative for dysuria. Musculoskeletal: Negative for back  pain. Skin: Negative for rash. Neurological: As above   ____________________________________________   PHYSICAL EXAM:  VITAL SIGNS: ED Triage Vitals  Enc Vitals Group     BP 12/31/16 0814 140/88     Pulse Rate 12/31/16 0814 72     Resp 12/31/16 0814 20     Temp 12/31/16 0814 97.8 F (36.6 C)     Temp Source 12/31/16 0814 Oral     SpO2 12/31/16 0814 99 %     Weight 12/31/16 0818 107.5 kg (237 lb)     Height 12/31/16 0818 1.803 m (5' 11")     Head Circumference --      Peak Flow --      Pain Score 12/31/16 0814 5     Pain Loc --  Pain Edu? --      Excl. in [place]? --     Constitutional: Alert and oriented. No acute distress. Pleasant and interactive Eyes: PERRLA, no nystagmus Nose: No congestion/rhinnorhea. Mouth/Throat: Mucous membranes are moist.   Neck:  Painless ROM Cardiovascular: Normal rate, regular rhythm. Grossly normal heart sounds.  Good peripheral circulation. Respiratory: Normal respiratory effort.  No retractions. Lungs CTAB. Gastrointestinal: Soft and nontender. No distention.  Genitourinary: deferred Musculoskeletal:   Warm and well perfused Neurologic:  Normal speech and language. No gross focal neurologic deficits are appreciated.  Skin:  Skin is warm, dry and intact. No rash noted. Psychiatric: Mood and affect are normal.   ____________________________________________   LABS (all labs ordered are listed, but only abnormal results are displayed)  Labs Reviewed  CBC - Abnormal; Notable for the following components:      Result Value   RBC 3.76 (*)    HCT 38.5 (*)    MCV 102.5 (*)    MCH 34.6 (*)    Platelets 148 (*)    All other components within normal limits  COMPREHENSIVE METABOLIC PANEL - Abnormal; Notable for the following components:   CO2 21 (*)    Glucose, Bld 212 (*)    BUN 24 (*)    All other components within normal limits  TROPONIN I - Abnormal; Notable for the following components:   Troponin I 0.04 (*)    All other  components within normal limits  URINALYSIS, COMPLETE (UACMP) WITH MICROSCOPIC - Abnormal; Notable for the following components:   Color, Urine STRAW (*)    APPearance CLEAR (*)    Glucose, UA 150 (*)    Hgb urine dipstick SMALL (*)    Ketones, ur 20 (*)    Squamous Epithelial / LPF 0-5 (*)    All other components within normal limits   ____________________________________________  EKG  None ____________________________________________  RADIOLOGY  Chest x-ray unremarkable ____________________________________________   PROCEDURES  Procedure(s) performed: No  Procedures   Critical Care performed: No ____________________________________________   INITIAL IMPRESSION / ASSESSMENT AND PLAN / ED COURSE  Pertinent labs & imaging results that were available during my care of the patient were reviewed by me and considered in my medical decision making (see chart for details).  Patient presents with dizziness, likely vertigo given his history.  Will treat with IV fluids check labs orthostatics and reevaluate  Also had social work consult given EMS reports.  Lab work is overall unremarkable, patient has had an elevated troponin in the past this appears chronic.  No chest pain.  EKG unremarkable.  Labs otherwise normal.  Patient feeling significantly better after IV fluids.  Seen by social work, has home health arranged, family brings food every other day and the patient seems to be well cared for.  He appears to be back to his baseline, appropriate for discharge at this time    ____________________________________________   FINAL CLINICAL IMPRESSION(S) / ED DIAGNOSES  Final diagnoses:  Vertigo  Weakness        Note:  This document was prepared using Dragon voice recognition software and may include unintentional dictation errors.    Lavonia Drafts, MD 12/31/16 [phone]

## 2016-12-31 NOTE — ED Notes (Signed)
Christoph Copelan (Son) cell [phone]    FAMILY CELL NUMBERS  Jaggar Benko (Daughter) cell [phone]

## 2016-12-31 NOTE — ED Notes (Signed)
Pt urinated in urinal independently. Urine collected and sent to lab for analysis.

## 2016-12-31 NOTE — ED Triage Notes (Signed)
As per EMS patient feels dizzy, blurred vision, unable to stand. Took 1 tablet of Meclizine at 0745 unknown dosage.  Patient was unable to contact his daughter this am for her to help him.  EMS states patient has no food in home, just a few canned goods.

## 2016-12-31 NOTE — Clinical Social Work Note (Signed)
Clinical Social Work Assessment  Patient Details  Name: Chase Simmons MRN: [id number] Date of Birth: 01-31-1933  Date of referral:  12/31/16               Reason for consult:  [employer]                Permission sought to share information with:  Family Supports Permission granted to share information::  Yes, Verbal Permission Granted  Name::     Chase Simmons [phone]  Agency::     Relationship::     Contact Information:     Housing/Transportation Living arrangements for the past 2 months:  Apartment Source of Information:  Patient, Adult Children Patient Interpreter Needed:  None Criminal Activity/Legal Involvement Pertinent to Current Situation/Hospitalization:  No - Comment as needed Significant Relationships:  Adult Children Lives with:  Self Do you feel safe going back to the place where you live?  Yes Need for family participation in patient care:  Yes (Comment)  Care giving concerns: Daughter does admit her dad now needs more help at home   Social Worker assessment / plan: LCSW  Employment status:  Retired Forensic scientist:  Chiropractor) PT Recommendations:  Not assessed at this time Information / Referral to community resources:  Other (Comment Required)(PACE Program)  Patient/Family's Response to care: He understands he was dizzy and fell  Patient/Family's Understanding of and Emotional Response to Diagnosis, Current Treatment, and Prognosis:  Family has good understanding dad needs more help. He understands he is weaker  Emotional Assessment Appearance:  Appears stated age Attitude/Demeanor/Rapport:  (calm) Affect (typically observed):  Accepting, Calm Orientation:  Oriented to Self, Oriented to Place, Oriented to  Time, Oriented to Situation Alcohol / Substance use:  Not Applicable Psych involvement (Current and /or in the community):  No (Comment)  Discharge Needs  Concerns to be addressed:  Care Coordination Readmission  within the last 30 days:  No Current discharge risk:  None Barriers to Discharge:  No Barriers Identified   Joana Reamer, LCSW 12/31/2016, 2:58 PM

## 2016-12-31 NOTE — Progress Notes (Signed)
LCSW introduced myself to patient and obtained verbal consent to speak to his daughter [name]. Called [name] who explained to me that they provide in home visits and had recently in home health supports PT and OT and nurses aid to bathe her father 2x week. Daughter reported she is awaire she needs to go back to family doctor to have new orders for in home supports to be re-instated.  Patient was oriented x4 and stated Dawn is the person who bathes him and helps around the house. Daughter and LCSW spoke and she assured this worker that fresh food and nourishment and frequent visits  happen with all family members and he is well cared for. They are looking for more support in future but understand the costs of SNF are out of reach at this time.  LCSW asked if I could provide any handouts SNF or resources for PACE, adult day programs and the family stated they already have them and will access resources as needed.  No further needs at this time.  [employer] LCSW [phone]

## 2016-12-31 NOTE — ED Notes (Signed)
Patient states he has had watery diarrhea for the last few days

## 2016-12-31 NOTE — ED Notes (Signed)
ED Provider at bedside. 

## 2016-12-31 NOTE — ED Notes (Signed)
Bed linens and diaper changed due to being soiled. Urinal given to pt and call bell within reach.

## 2016-12-31 NOTE — ED Notes (Signed)
Pt's daughters present to take pt home. Assisted pt in getting dressed and getting into wheelchair.

## 2016-12-31 NOTE — ED Notes (Signed)
SW at bedside.

## 2016-12-31 NOTE — ED Notes (Signed)
Meal tray provided per pt request.  

## 2016-12-31 NOTE — ED Notes (Signed)
Pt unable to stand to get VS.

## 2016-12-31 NOTE — ED Notes (Addendum)
Date and time results received: 12/31/16 0928 (use smartphrase ".now" to insert current time)  Test: Troponin Critical Value: 0.04  Name of Provider Notified: Dr. Corky Downs  Orders Received? Or Actions Taken?: Actions Taken: Notified

## 2017-01-02 DIAGNOSIS — G250 Essential tremor: Secondary | ICD-10-CM | POA: Diagnosis not present

## 2017-01-02 DIAGNOSIS — I110 Hypertensive heart disease with heart failure: Secondary | ICD-10-CM | POA: Diagnosis not present

## 2017-01-02 DIAGNOSIS — I2510 Atherosclerotic heart disease of native coronary artery without angina pectoris: Secondary | ICD-10-CM | POA: Diagnosis not present

## 2017-01-02 DIAGNOSIS — I5020 Unspecified systolic (congestive) heart failure: Secondary | ICD-10-CM | POA: Diagnosis not present

## 2017-01-02 DIAGNOSIS — K8020 Calculus of gallbladder without cholecystitis without obstruction: Secondary | ICD-10-CM | POA: Diagnosis not present

## 2017-01-02 DIAGNOSIS — M6281 Muscle weakness (generalized): Secondary | ICD-10-CM | POA: Diagnosis not present

## 2017-01-02 DIAGNOSIS — E119 Type 2 diabetes mellitus without complications: Secondary | ICD-10-CM | POA: Diagnosis not present

## 2017-01-02 DIAGNOSIS — I480 Paroxysmal atrial fibrillation: Secondary | ICD-10-CM | POA: Diagnosis not present

## 2017-01-02 DIAGNOSIS — G40909 Epilepsy, unspecified, not intractable, without status epilepticus: Secondary | ICD-10-CM | POA: Diagnosis not present

## 2017-01-02 DIAGNOSIS — J449 Chronic obstructive pulmonary disease, unspecified: Secondary | ICD-10-CM | POA: Diagnosis not present

## 2017-01-03 DIAGNOSIS — E119 Type 2 diabetes mellitus without complications: Secondary | ICD-10-CM | POA: Diagnosis not present

## 2017-01-03 DIAGNOSIS — G40909 Epilepsy, unspecified, not intractable, without status epilepticus: Secondary | ICD-10-CM | POA: Diagnosis not present

## 2017-01-03 DIAGNOSIS — J449 Chronic obstructive pulmonary disease, unspecified: Secondary | ICD-10-CM | POA: Diagnosis not present

## 2017-01-03 DIAGNOSIS — I2510 Atherosclerotic heart disease of native coronary artery without angina pectoris: Secondary | ICD-10-CM | POA: Diagnosis not present

## 2017-01-03 DIAGNOSIS — G250 Essential tremor: Secondary | ICD-10-CM | POA: Diagnosis not present

## 2017-01-03 DIAGNOSIS — I480 Paroxysmal atrial fibrillation: Secondary | ICD-10-CM | POA: Diagnosis not present

## 2017-01-03 DIAGNOSIS — M6281 Muscle weakness (generalized): Secondary | ICD-10-CM | POA: Diagnosis not present

## 2017-01-03 DIAGNOSIS — I110 Hypertensive heart disease with heart failure: Secondary | ICD-10-CM | POA: Diagnosis not present

## 2017-01-03 DIAGNOSIS — I5020 Unspecified systolic (congestive) heart failure: Secondary | ICD-10-CM | POA: Diagnosis not present

## 2017-01-03 DIAGNOSIS — K8020 Calculus of gallbladder without cholecystitis without obstruction: Secondary | ICD-10-CM | POA: Diagnosis not present

## 2017-01-04 ENCOUNTER — Other Ambulatory Visit: Payer: Medicare HMO

## 2017-01-05 DIAGNOSIS — I2510 Atherosclerotic heart disease of native coronary artery without angina pectoris: Secondary | ICD-10-CM | POA: Diagnosis not present

## 2017-01-05 DIAGNOSIS — M6281 Muscle weakness (generalized): Secondary | ICD-10-CM | POA: Diagnosis not present

## 2017-01-05 DIAGNOSIS — G250 Essential tremor: Secondary | ICD-10-CM | POA: Diagnosis not present

## 2017-01-05 DIAGNOSIS — G40909 Epilepsy, unspecified, not intractable, without status epilepticus: Secondary | ICD-10-CM | POA: Diagnosis not present

## 2017-01-05 DIAGNOSIS — E119 Type 2 diabetes mellitus without complications: Secondary | ICD-10-CM | POA: Diagnosis not present

## 2017-01-05 DIAGNOSIS — I480 Paroxysmal atrial fibrillation: Secondary | ICD-10-CM | POA: Diagnosis not present

## 2017-01-05 DIAGNOSIS — J449 Chronic obstructive pulmonary disease, unspecified: Secondary | ICD-10-CM | POA: Diagnosis not present

## 2017-01-05 DIAGNOSIS — I110 Hypertensive heart disease with heart failure: Secondary | ICD-10-CM | POA: Diagnosis not present

## 2017-01-05 DIAGNOSIS — I5020 Unspecified systolic (congestive) heart failure: Secondary | ICD-10-CM | POA: Diagnosis not present

## 2017-01-05 DIAGNOSIS — K8020 Calculus of gallbladder without cholecystitis without obstruction: Secondary | ICD-10-CM | POA: Diagnosis not present

## 2017-01-07 ENCOUNTER — Other Ambulatory Visit: Payer: Self-pay

## 2017-01-07 ENCOUNTER — Encounter: Payer: Self-pay | Admitting: Emergency Medicine

## 2017-01-07 ENCOUNTER — Emergency Department: Payer: Medicare HMO

## 2017-01-07 ENCOUNTER — Emergency Department
Admission: EM | Admit: 2017-01-07 | Discharge: 2017-01-08 | Disposition: A | Discharge status: Home / Self Care | Payer: Medicare HMO | Attending: Emergency Medicine | Admitting: Emergency Medicine

## 2017-01-07 DIAGNOSIS — Z87891 Personal history of nicotine dependence: Secondary | ICD-10-CM | POA: Insufficient documentation

## 2017-01-07 DIAGNOSIS — Z91040 Latex allergy status: Secondary | ICD-10-CM | POA: Diagnosis not present

## 2017-01-07 DIAGNOSIS — I110 Hypertensive heart disease with heart failure: Secondary | ICD-10-CM | POA: Insufficient documentation

## 2017-01-07 DIAGNOSIS — Z79899 Other long term (current) drug therapy: Secondary | ICD-10-CM | POA: Insufficient documentation

## 2017-01-07 DIAGNOSIS — Z7982 Long term (current) use of aspirin: Secondary | ICD-10-CM | POA: Insufficient documentation

## 2017-01-07 DIAGNOSIS — E119 Type 2 diabetes mellitus without complications: Secondary | ICD-10-CM | POA: Diagnosis not present

## 2017-01-07 DIAGNOSIS — R42 Dizziness and giddiness: Secondary | ICD-10-CM | POA: Diagnosis not present

## 2017-01-07 DIAGNOSIS — I2510 Atherosclerotic heart disease of native coronary artery without angina pectoris: Secondary | ICD-10-CM | POA: Insufficient documentation

## 2017-01-07 DIAGNOSIS — R531 Weakness: Secondary | ICD-10-CM | POA: Diagnosis not present

## 2017-01-07 DIAGNOSIS — J449 Chronic obstructive pulmonary disease, unspecified: Secondary | ICD-10-CM | POA: Insufficient documentation

## 2017-01-07 DIAGNOSIS — Z7984 Long term (current) use of oral hypoglycemic drugs: Secondary | ICD-10-CM | POA: Diagnosis not present

## 2017-01-07 DIAGNOSIS — I5042 Chronic combined systolic (congestive) and diastolic (congestive) heart failure: Secondary | ICD-10-CM | POA: Diagnosis not present

## 2017-01-07 DIAGNOSIS — R112 Nausea with vomiting, unspecified: Secondary | ICD-10-CM | POA: Diagnosis not present

## 2017-01-07 DIAGNOSIS — R1110 Vomiting, unspecified: Secondary | ICD-10-CM | POA: Diagnosis not present

## 2017-01-07 DIAGNOSIS — R1111 Vomiting without nausea: Secondary | ICD-10-CM | POA: Diagnosis not present

## 2017-01-07 LAB — CBC
HEMATOCRIT: 39 % — AB (ref 40.0–52.0)
HEMOGLOBIN: 13.1 g/dL (ref 13.0–18.0)
MCH: 34.2 pg — AB (ref 26.0–34.0)
MCHC: 33.6 g/dL (ref 32.0–36.0)
MCV: 101.8 fL — AB (ref 80.0–100.0)
PLATELETS: 133 10*3/uL — AB (ref 150–440)
RBC: 3.83 MIL/uL — ABNORMAL LOW (ref 4.40–5.90)
RDW: 13.1 % (ref 11.5–14.5)
WBC: 7.1 10*3/uL (ref 3.8–10.6)

## 2017-01-07 LAB — COMPREHENSIVE METABOLIC PANEL
ALK PHOS: 81 U/L (ref 38–126)
ALT: 20 U/L (ref 17–63)
AST: 23 U/L (ref 15–41)
Albumin: 3.8 g/dL (ref 3.5–5.0)
Anion gap: 7 (ref 5–15)
BILIRUBIN TOTAL: 0.3 mg/dL (ref 0.3–1.2)
BUN: 31 mg/dL — AB (ref 6–20)
CALCIUM: 8.9 mg/dL (ref 8.9–10.3)
CHLORIDE: 104 mmol/L (ref 101–111)
CO2: 27 mmol/L (ref 22–32)
CREATININE: 1.05 mg/dL (ref 0.61–1.24)
Glucose, Bld: 178 mg/dL — ABNORMAL HIGH (ref 65–99)
Potassium: 4 mmol/L (ref 3.5–5.1)
Sodium: 138 mmol/L (ref 135–145)
TOTAL PROTEIN: 6.9 g/dL (ref 6.5–8.1)

## 2017-01-07 LAB — URINALYSIS, COMPLETE (UACMP) WITH MICROSCOPIC
Bacteria, UA: NONE SEEN
Bilirubin Urine: NEGATIVE
Glucose, UA: 150 mg/dL — AB
Ketones, ur: NEGATIVE mg/dL
Leukocytes, UA: NEGATIVE
Nitrite: NEGATIVE
PROTEIN: NEGATIVE mg/dL
SPECIFIC GRAVITY, URINE: 1.009 (ref 1.005–1.030)
WBC, UA: NONE SEEN WBC/hpf (ref 0–5)
pH: 7 (ref 5.0–8.0)

## 2017-01-07 LAB — TROPONIN I: Troponin I: 0.04 ng/mL (ref <0.03)

## 2017-01-07 MED ORDER — IOPAMIDOL (ISOVUE-300) INJECTION 61%
100.0000 mL | Freq: Once | INTRAVENOUS | Status: AC | PRN
  Administered 2017-01-07: 100 mL via INTRAVENOUS

## 2017-01-07 MED ORDER — SODIUM CHLORIDE 0.9 % IV BOLUS (SEPSIS)
1000.0000 mL | Freq: Once | INTRAVENOUS | Status: AC
  Administered 2017-01-07: 1000 mL via INTRAVENOUS

## 2017-01-07 MED ORDER — MORPHINE SULFATE (PF) 4 MG/ML IV SOLN
INTRAVENOUS | Status: AC
  Administered 2017-01-07: 4 mg via INTRAVENOUS
  Filled 2017-01-07: qty 1

## 2017-01-07 MED ORDER — ONDANSETRON HCL 4 MG/2ML IJ SOLN
4.0000 mg | Freq: Once | INTRAMUSCULAR | Status: AC
  Administered 2017-01-07: 4 mg via INTRAVENOUS

## 2017-01-07 MED ORDER — ONDANSETRON HCL 4 MG/2ML IJ SOLN
INTRAMUSCULAR | Status: AC
  Administered 2017-01-07: 4 mg via INTRAVENOUS
  Filled 2017-01-07: qty 2

## 2017-01-07 MED ORDER — MORPHINE SULFATE (PF) 4 MG/ML IV SOLN
4.0000 mg | Freq: Once | INTRAVENOUS | Status: AC
  Administered 2017-01-07: 4 mg via INTRAVENOUS

## 2017-01-07 NOTE — ED Notes (Signed)
[name] (daughter) phone #: [phone]; [name] (daughter) phone #: [phone].

## 2017-01-07 NOTE — ED Notes (Signed)
Daughter [name] at bedside.

## 2017-01-07 NOTE — ED Provider Notes (Signed)
[facility] Emergency Department Provider Note   ____________________________________________    I have reviewed the triage vital signs and the nursing notes.   HISTORY  Chief Complaint Dizziness     HPI Chase Simmons is a 84 y.o. male who presents with dizziness and weakness.  Patient seen by me on 29 December for similar complaints.  He notes that today he felt dizzy and weak and had an episode of nausea and vomiting.  According to his daughters this is chronic and has been occurring over the last 2 years.  They feel that he is not safe to live i independently anymore and they are not able to care for him.  Patient denies fevers or chills.  No chest pain.  No abdominal pain.  No hematemesis.  No shortness of breath or cough   Past Medical History:  Diagnosis Date  . Allergy   . Bladder cancer ([place])    a. followed by Dr. Jacqlyn Larsen  . Chronic combined systolic (congestive) and diastolic (congestive) heart failure ([place])    a. 07/2016 Echo: EF 30-35%, sev mid-apicalanteroseptal, ant, inf HK, Gr1 DD.  Marland Kitchen COPD (chronic obstructive pulmonary disease) ([place])   . Hyperlipidemia   . Hypertension   . NICM (nonischemic cardiomyopathy) ([place])    a. 07/2016 Echo: EF 30-35%, sev mid-apicalanteroseptal, ant, inf HK, Gr1 DD, mild MR, mildly dil LA, PASP 38mHg - ? stress induced CM.  .Marland KitchenNonobstructive Coronary atherosclerosis    a. 07/2016 Cath: LM nl, LAD nl, D1 40ost, LCX 249mRCA nl, EF 25-30%.  . Marland KitchenAF (paroxysmal atrial fibrillation) (HCKah[place]   a. Dx 08/2013. CHA2DS2VASc = 6-->no OAC 2/2 h/o Simmons and nocompliance.  . Tremor, essential    sees [facility] neurologist  . Type 2 diabetes mellitus with complication, without long-term current use of insulin (HCC[place]7/30/2014  . Urinary incontinence     Patient Active Problem List   Diagnosis Date Noted  . Bilateral leg edema 11/08/2016  . Dizziness 08/25/2016  . Nausea 08/01/2016  . CHF (congestive heart failure) (HCMa[place] 08/01/2016  . Atypical chest pain 07/20/2016  . COPD (chronic obstructive pulmonary disease) (HCPont[place]03/13/2018  . Coronary atherosclerosis of native coronary artery 01/05/2016  . Aortic atherosclerosis (HCD[place]01/02/2016  . Bradycardia 01/05/2016  . Anxiety 06/17/2015  . Malignant neoplasm of urinary bladder (HCLehigh06/14/2017  . Allergic rhinitis 05/14/2015  . PAF (paroxysmal atrial fibrillation) (HC[facility]haven[place]oon[place]08/28/2015  . Acid reflux 08/30/2013  . Tremor, essential 10/22/2012  . Hyperlipidemia 10/22/2012  . HTN (hypertension) 08/01/2012  . Type 2 diabetes mellitus with complication, without long-term current use of insulin (HCEast Feliciana07/30/2014  . Benign prostatic hyperplasia with urinary obstruction 09/02/2011    Past Surgical History:  Procedure Laterality Date  . bladder cancer    . CATARACT EXTRACTION    . HERNIA REPAIR    . LEFT HEART CATH AND CORONARY ANGIOGRAPHY N/A 07/11/2016   Procedure: Left Heart Cath and Coronary Angiography;  Surgeon: ArWellington HampshireMD;  [facility];  Service: Cardiovascular;  Laterality: N/A;    Prior to Admission medications   Medication Sig Start Date End Date Taking? Authorizing Provider  acetaminophen (TYLENOL) 325 MG tablet Take 2 tablets (650 mg total) by mouth every 6 (six) hours as needed for mild pain (or Fever >/= 101). 06/22/15   GoNicholes MangoMD  ALPRAZolam (XDuanne Moron0.25 MG tablet take 1 tablet by mouth every 12 hours if needed for anxiety 07/25/16   VaAnselm Jungling  Rosalio Macadamia, MD  aspirin EC 81 MG tablet Take 1 tablet (81 mg total) by mouth daily. 03/15/16   Coral Spikes, DO  atorvastatin (LIPITOR) 80 MG tablet TAKE 1 TABLET ONE TIME DAILY 11/04/15   Thersa Salt G, DO  blood glucose meter kit and supplies KIT Dispense based on patient and insurance preference. Use up to four times daily as directed. 08/01/16   Coral Spikes, DO  fexofenadine (ALLEGRA) 180 MG tablet Take 180 mg by mouth daily as needed for allergies or rhinitis.    [provider]  glimepiride (AMARYL) 4 MG tablet take 1 tablet by mouth WITH BREAKFAST. 12/23/16   Leone Haven, MD  lisinopril (PRINIVIL,ZESTRIL) 20 MG tablet Take 1 tablet (20 mg total) by mouth daily. 12/23/16   Leone Haven, MD  meclizine (ANTIVERT) 12.5 MG tablet take 1 tablet by mouth three times a day if needed for dizziness 10/31/16   Leone Haven, MD  metFORMIN (GLUCOPHAGE) 1000 MG tablet Take 1 tablet (1,000 mg total) 2 (two) times daily with a meal by mouth. 11/15/16   Leone Haven, MD  pantoprazole (PROTONIX) 40 MG tablet Take 1 tablet (40 mg total) by mouth daily. 12/23/16   Leone Haven, MD  PARoxetine (PAXIL) 40 MG tablet Take 1 tablet (40 mg total) by mouth daily. 12/23/16   Leone Haven, MD  primidone (MYSOLINE) 50 MG tablet Take 1.5 tablets (75 mg total) by mouth 2 (two) times daily. 09/15/16   Coral Spikes, DO  tiotropium (SPIRIVA) 18 MCG inhalation capsule Place 18 mcg as needed into inhaler and inhale.     [provider]     Allergies Latex  Family History  Problem Relation Age of Onset  . Arthritis Mother   . Dementia Mother   . Cancer Maternal Uncle        prostate  . Cancer Maternal Aunt        breast cancer    Social History Social History   Tobacco Use  . Smoking status: Former Smoker    Packs/day: 1.50    Years: 40.00    Pack years: 60.00  . Smokeless tobacco: Never Used  Substance Use Topics  . Alcohol use: No    Alcohol/week: 7.2 oz    Types: 12 Standard drinks or equivalent per week    Frequency: Never    Comment: 1 drink with dinner  . Drug use: No    Review of Systems  Constitutional: Dizziness as above Eyes: No visual changes.  ENT: No sore throat. Cardiovascular: Denies chest pain. Respiratory: Denies shortness of breath. Gastrointestinal: As above Genitourinary: Negative for dysuria. Musculoskeletal: Negative for back pain. Skin: Negative for rash. Neurological: Negative for headaches  or focal weakness   ____________________________________________   PHYSICAL EXAM:  VITAL SIGNS: ED Triage Vitals  Enc Vitals Group     BP 01/07/17 1641 (!) 193/73     Pulse Rate 01/07/17 1641 74     Resp 01/07/17 1641 20     Temp 01/07/17 1641 97.8 F (36.6 C)     Temp Source 01/07/17 1641 Oral     SpO2 01/07/17 1641 96 %     Weight 01/07/17 1648 108.9 kg (240 lb)     Height 01/07/17 1648 1.803 m ('5\' 11"'$ )     Head Circumference --      Peak Flow --      Pain Score 01/07/17 1648 0     Pain Loc --  Pain Edu? --      Excl. in [place]? --     Constitutional: Alert and oriented. No acute distress.  Eyes: Conjunctivae are normal.   Nose: No congestion/rhinnorhea. Mouth/Throat: Mucous membranes are moist.    Cardiovascular: Normal rate, regular rhythm. Grossly normal heart sounds.  Good peripheral circulation. Respiratory: Normal respiratory effort.  No retractions. Lungs CTAB. Gastrointestinal: Soft and nontender. No distention.  No CVA tenderness. Genitourinary: deferred Musculoskeletal:   Warm and well perfused Neurologic:  Normal speech and language. No gross focal neurologic deficits are appreciated.  Skin:  Skin is warm, dry and intact. No rash noted. Psychiatric: Mood and affect are normal. Speech and behavior are normal.  ____________________________________________   LABS (all labs ordered are listed, but only abnormal results are displayed)  Labs Reviewed  CBC - Abnormal; Notable for the following components:      Result Value   RBC 3.83 (*)    HCT 39.0 (*)    MCV 101.8 (*)    MCH 34.2 (*)    Platelets 133 (*)    All other components within normal limits  COMPREHENSIVE METABOLIC PANEL - Abnormal; Notable for the following components:   Glucose, Bld 178 (*)    BUN 31 (*)    All other components within normal limits  TROPONIN I - Abnormal; Notable for the following components:   Troponin I 0.04 (*)    All other components within normal limits    URINALYSIS, COMPLETE (UACMP) WITH MICROSCOPIC - Abnormal; Notable for the following components:   Color, Urine STRAW (*)    APPearance CLEAR (*)    Glucose, UA 150 (*)    Hgb urine dipstick SMALL (*)    Squamous Epithelial / LPF 0-5 (*)    All other components within normal limits   ____________________________________________  EKG  ED ECG REPORT I, Lavonia Drafts, the attending physician, personally viewed and interpreted this ECG.  Date: 01/07/2017  Rhythm: normal sinus rhythm QRS Axis: normal Intervals: normal ST/T Wave abnormalities: normal   ____________________________________________  RADIOLOGY  CT head no acute distress CT Abd/pel no acute distress ____________________________________________   PROCEDURES  Procedure(s) performed: No  Procedures   Critical Care performed: No ____________________________________________   INITIAL IMPRESSION / ASSESSMENT AND PLAN / ED COURSE  Pertinent labs & imaging results that were available during my care of the patient were reviewed by me and considered in my medical decision making (see chart for details).  Had long discussion with daughters regarding patient's workup which is essentially unremarkable.  I discussed with them consulting social work as well as physical therapy from the ED and that this can take days but they do not feel that they can care for him were that he can go home.  Do not see an indication for admission at this time.  Patient resting comfortably, no further nausea    ____________________________________________   FINAL CLINICAL IMPRESSION(S) / ED DIAGNOSES  Final diagnoses:  Dizziness  Weakness        Note:  This document was prepared using Dragon voice recognition software and may include unintentional dictation errors.    Lavonia Drafts, MD 01/07/17 2132

## 2017-01-07 NOTE — ED Triage Notes (Addendum)
Pt arrives via [facility] with c/o intermittent episodes of dizziness/vertigo with vomiting from [facility] facility. Per EMS, pt afib on 12-lead with PACs and PVCs.Pt family states he needs higher level of care, as facility is independent living facility.

## 2017-01-08 DIAGNOSIS — J449 Chronic obstructive pulmonary disease, unspecified: Secondary | ICD-10-CM | POA: Diagnosis not present

## 2017-01-08 DIAGNOSIS — R42 Dizziness and giddiness: Secondary | ICD-10-CM | POA: Diagnosis not present

## 2017-01-08 DIAGNOSIS — Z7982 Long term (current) use of aspirin: Secondary | ICD-10-CM | POA: Diagnosis not present

## 2017-01-08 DIAGNOSIS — E119 Type 2 diabetes mellitus without complications: Secondary | ICD-10-CM | POA: Diagnosis not present

## 2017-01-08 DIAGNOSIS — R531 Weakness: Secondary | ICD-10-CM | POA: Diagnosis not present

## 2017-01-08 DIAGNOSIS — Z79899 Other long term (current) drug therapy: Secondary | ICD-10-CM | POA: Diagnosis not present

## 2017-01-08 DIAGNOSIS — I2510 Atherosclerotic heart disease of native coronary artery without angina pectoris: Secondary | ICD-10-CM | POA: Diagnosis not present

## 2017-01-08 DIAGNOSIS — I5042 Chronic combined systolic (congestive) and diastolic (congestive) heart failure: Secondary | ICD-10-CM | POA: Diagnosis not present

## 2017-01-08 DIAGNOSIS — I110 Hypertensive heart disease with heart failure: Secondary | ICD-10-CM | POA: Diagnosis not present

## 2017-01-08 DIAGNOSIS — R1110 Vomiting, unspecified: Secondary | ICD-10-CM | POA: Diagnosis not present

## 2017-01-08 LAB — GLUCOSE, CAPILLARY: GLUCOSE-CAPILLARY: 195 mg/dL — AB (ref 65–99)

## 2017-01-08 MED ORDER — METFORMIN HCL 500 MG PO TABS
1000.0000 mg | ORAL_TABLET | Freq: Two times a day (BID) | ORAL | Status: DC
  Administered 2017-01-08: 1000 mg via ORAL
  Filled 2017-01-08: qty 2

## 2017-01-08 MED ORDER — ATORVASTATIN CALCIUM 20 MG PO TABS: 80.0000 mg | ORAL_TABLET | Freq: Every day | ORAL | Status: DC

## 2017-01-08 MED ORDER — PANTOPRAZOLE SODIUM 40 MG PO TBEC
40.0000 mg | DELAYED_RELEASE_TABLET | Freq: Every day | ORAL | Status: DC
  Administered 2017-01-08: 40 mg via ORAL
  Filled 2017-01-08: qty 1

## 2017-01-08 MED ORDER — ASPIRIN EC 81 MG PO TBEC
81.0000 mg | DELAYED_RELEASE_TABLET | Freq: Every day | ORAL | Status: DC
  Administered 2017-01-08: 81 mg via ORAL
  Filled 2017-01-08: qty 1

## 2017-01-08 MED ORDER — PAROXETINE HCL 20 MG PO TABS
40.0000 mg | ORAL_TABLET | Freq: Every day | ORAL | Status: DC
  Administered 2017-01-08: 40 mg via ORAL
  Filled 2017-01-08: qty 2

## 2017-01-08 MED ORDER — LISINOPRIL 10 MG PO TABS
20.0000 mg | ORAL_TABLET | Freq: Every day | ORAL | Status: DC
  Administered 2017-01-08: 20 mg via ORAL
  Filled 2017-01-08: qty 2

## 2017-01-08 MED ORDER — GLIMEPIRIDE 4 MG PO TABS
4.0000 mg | ORAL_TABLET | Freq: Every day | ORAL | Status: DC
  Administered 2017-01-08: 4 mg via ORAL
  Filled 2017-01-08: qty 1

## 2017-01-08 MED ORDER — PRIMIDONE 50 MG PO TABS
75.0000 mg | ORAL_TABLET | Freq: Two times a day (BID) | ORAL | Status: DC
  Administered 2017-01-08: 75 mg via ORAL
  Filled 2017-01-08 (×2): qty 1.5

## 2017-01-08 MED ORDER — TIOTROPIUM BROMIDE MONOHYDRATE 18 MCG IN CAPS
18.0000 ug | ORAL_CAPSULE | RESPIRATORY_TRACT | Status: DC | PRN
  Filled 2017-01-08: qty 5

## 2017-01-08 NOTE — ED Provider Notes (Signed)
-----------------------------------------   12:33 PM on 01/08/2017 -----------------------------------------  Social work tried to find patient placement in SNF however no SNF in the area will accept him at this time secondary to insurance issues. Discussed this with patient's daughter. It is an unfortunate situation given that patient likely would benefit from SNF placement. At this time however will plan on discharging back to living faciliy. Will request more frequent home health and PT (family states currently only getting it once a week). Family appeared understanding with situation and okay with taking patient back to current living situation. They will continue to work with insurance for placement.    Nance Pear, MD 01/08/17 1236

## 2017-01-08 NOTE — Care Management Note (Signed)
Case Management Note  Patient Details  Name: Chase Simmons MRN: [id number] Date of Birth: 1933/03/09  Subjective/Objective:  Home Health PT and Home health aide                  Action/Plan:[facility]axed to [facility] [phone]   Expected Discharge Date:                  Expected Discharge Plan:     In-House Referral:     Discharge planning Services     Post Acute Care Choice:   [facility] Choice offered to:   patient/daughter [name]  DME Arranged:    DME Agency:     HH Arranged yes [place] Agency:   [facility]  Status of Service:   Active  If discussed at Long Length of Stay Meetings, dates discussed:    Additional Comments:  Ival Bible, RN 01/08/2017, 8:03 PM

## 2017-01-08 NOTE — ED Notes (Signed)

## 2017-01-08 NOTE — NC FL2 (Signed)
[place] LEVEL OF CARE SCREENING TOOL     IDENTIFICATION  Patient Name: Chase Simmons Birthdate: 09-17-1933 Sex: male Admission Date (Current Location): 01/07/2017  [place] and [place] Number:  Engineering geologist and Address:  [facility], [address], [place], [place] [address]      Provider Number: [id number]  Attending Physician Name and Address:  No att. providers found  Relative Name and Phone Number:  Maxfield Gildersleeve (Daughter) [phone]    Current Level of Care: Hospital Recommended Level of Care: [place] Prior Approval Number:    Date Approved/Denied:   PASRR Number: [id number]  Discharge Plan: SNF    Current Diagnoses: Patient Active Problem List   Diagnosis Date Noted  . Bilateral leg edema 11/08/2016  . Dizziness 08/25/2016  . Nausea 08/01/2016  . CHF (congestive heart failure) ([place]) 08/01/2016  . Atypical chest pain 07/20/2016  . COPD (chronic obstructive pulmonary disease) ([place]) 03/15/2016  . Coronary atherosclerosis of native coronary artery 01/05/2016  . Aortic atherosclerosis ([place]) 01/05/2016  . Bradycardia 01/05/2016  . Anxiety 06/17/2015  . Malignant neoplasm of urinary bladder ([place]) 06/17/2015  . Allergic rhinitis 05/14/2015  . PAF (paroxysmal atrial fibrillation) ([place]) 08/30/2013  . Acid reflux 08/30/2013  . Tremor, essential 10/22/2012  . Hyperlipidemia 10/22/2012  . HTN (hypertension) 08/01/2012  . Type 2 diabetes mellitus with complication, without long-term current use of insulin ( [place]) 08/01/2012  . Benign prostatic hyperplasia with urinary obstruction 09/02/2011    Orientation RESPIRATION BLADDER Height & Weight     Self, Time, Situation, Place  Normal Continent Weight: 240 lb (108.9 kg) Height:  5' 11" (180.3 cm)  BEHAVIORAL SYMPTOMS/MOOD NEUROLOGICAL BOWEL NUTRITION STATUS      Continent Diet(Carb Modified)  AMBULATORY STATUS COMMUNICATION OF NEEDS Skin   Extensive  Assist Verbally Normal                       Personal Care Assistance Level of Assistance  Bathing, Feeding, Dressing Bathing Assistance: Limited assistance Feeding assistance: Independent Dressing Assistance: Limited assistance     Functional Limitations Info             SPECIAL CARE FACTORS FREQUENCY  PT (By licensed PT)     PT Frequency: Up to 5X per day, 5 days per week              Contractures Contractures Info: Not present    Additional Factors Info  Code Status, Allergies, Psychotropic Code Status Info: Full Allergies Info: Latex Psychotropic Info: Paxil         Current Medications (01/08/2017):  This is the current hospital active medication list Current Facility-Administered Medications  Medication Dose Route Frequency Provider Last Rate Last Dose  . aspirin EC tablet 81 mg  81 mg Oral Daily Paulette Blanch, MD   81 mg at 01/08/17 1021  . atorvastatin (LIPITOR) tablet 80 mg  80 mg Oral q1800 Paulette Blanch, MD      . glimepiride (AMARYL) tablet 4 mg  4 mg Oral Q breakfast Paulette Blanch, MD   4 mg at 01/08/17 0904  . lisinopril (PRINIVIL,ZESTRIL) tablet 20 mg  20 mg Oral Daily Paulette Blanch, MD   20 mg at 01/08/17 1028  . metFORMIN (GLUCOPHAGE) tablet 1,000 mg  1,000 mg Oral BID WC Paulette Blanch, MD   1,000 mg at 01/08/17 4239  . pantoprazole (PROTONIX) EC tablet 40 mg  40 mg Oral Daily  Paulette Blanch, MD   40 mg at 01/08/17 1021  . PARoxetine (PAXIL) tablet 40 mg  40 mg Oral Daily Paulette Blanch, MD   40 mg at 01/08/17 1022  . primidone (MYSOLINE) tablet 75 mg  75 mg Oral BID Paulette Blanch, MD   75 mg at 01/08/17 1022  . tiotropium (SPIRIVA) inhalation capsule 18 mcg  18 mcg Inhalation PRN Paulette Blanch, MD       Current Outpatient Medications  Medication Sig Dispense Refill  . acetaminophen (TYLENOL) 325 MG tablet Take 2 tablets (650 mg total) by mouth every 6 (six) hours as needed for mild pain (or Fever >/= 101).    Marland Kitchen ALPRAZolam (XANAX) 0.25 MG tablet take 1  tablet by mouth every 12 hours if needed for anxiety 20 tablet 1  . aspirin EC 81 MG tablet Take 1 tablet (81 mg total) by mouth daily. 90 tablet 3  . atorvastatin (LIPITOR) 80 MG tablet TAKE 1 TABLET ONE TIME DAILY 90 tablet 3  . blood glucose meter kit and supplies KIT Dispense based on patient and insurance preference. Use up to four times daily as directed. 1 each 0  . fexofenadine (ALLEGRA) 180 MG tablet Take 180 mg by mouth daily as needed for allergies or rhinitis.    Marland Kitchen glimepiride (AMARYL) 4 MG tablet take 1 tablet by mouth WITH BREAKFAST. 90 tablet 1  . lisinopril (PRINIVIL,ZESTRIL) 20 MG tablet Take 1 tablet (20 mg total) by mouth daily. 90 tablet 1  . meclizine (ANTIVERT) 12.5 MG tablet take 1 tablet by mouth three times a day if needed for dizziness 30 tablet 0  . metFORMIN (GLUCOPHAGE) 1000 MG tablet Take 1 tablet (1,000 mg total) 2 (two) times daily with a meal by mouth. 60 tablet 6  . pantoprazole (PROTONIX) 40 MG tablet Take 1 tablet (40 mg total) by mouth daily. 90 tablet 1  . PARoxetine (PAXIL) 40 MG tablet Take 1 tablet (40 mg total) by mouth daily. 90 tablet 1  . primidone (MYSOLINE) 50 MG tablet Take 1.5 tablets (75 mg total) by mouth 2 (two) times daily. 270 tablet 1  . tiotropium (SPIRIVA) 18 MCG inhalation capsule Place 18 mcg as needed into inhaler and inhale.        Discharge Medications: Please see discharge summary for a list of discharge medications.  Relevant Imaging Results:  Relevant Lab Results:   Additional Information [id number]  Zettie Pho, LCSW

## 2017-01-08 NOTE — ED Notes (Signed)
Emptied patient's urinal, 400 ml.

## 2017-01-08 NOTE — Clinical Social Work Note (Signed)
Clinical Social Work Assessment  Patient Details  Name: Chase Simmons MRN: [id number] Date of Birth: 12-03-1933  Date of referral:  01/08/17               Reason for consult:  Facility Placement                Permission sought to share information with:  Facility Art therapist granted to share information::  Yes, Verbal Permission Granted  Name::        Agency::     Relationship::     Contact Information:     Housing/Transportation Living arrangements for the past 2 months:  [place] of Information:  Patient, Medical Team, Adult Children Patient Interpreter Needed:  None Criminal Activity/Legal Involvement Pertinent to Current Situation/Hospitalization:  No - Comment as needed Significant Relationships:  Adult Children, Community Support Lives with:  Self Do you feel safe going back to the place where you live?  Yes Need for family participation in patient care:  No (Coment)  Care giving concerns:  PT recommendation for STR   Social Worker assessment / plan:  CSW met with the patient and his daughter at bedside to discuss discharge planning. The CSW explained the insurance barrier Scientist, clinical (histocompatibility and immunogenetics) does not authorize on weekends) and the process for referral. The patient's daughter indicated that she is willing to expand the search to [facility] and surrounding areas, and she is aware of resources within the community from earlier visits to the ED. The patient lives alone in an independent living community, and the patient's daughter is aware that the patient does not socialize with others as often as he could (due to social anxiety), and he often impulsively eats which alters his blood sugar.  The CSW has begun the referral process. So far, no [facility] are able to accept him today without prior authorization, even with and LOG, due to the patient's insurance company's history of denial of authorization. The CSW will continue to attempt  contacting SNFs in the [place] area to find one that may accept today. If not, the CSW will discuss the patient returning home with resumption of home health PT with addition of Aide and SW. CSW is following.  Employment status:  Retired Nurse, adult PT Recommendations:  [place] / Referral to community resources:  [place]  Patient/Family's Response to care:  The patient and his daughter were pleasant and have insight. They both thanked the CSW.  Patient/Family's Understanding of and Emotional Response to Diagnosis, Current Treatment, and Prognosis:  The patient's family understands that the patient would benefit from ALF; however, cost is the main barrier. CSW has suggest retrying Medicaid as it is a new year with possible changes to income threshold.  Emotional Assessment Appearance:  Appears stated age Attitude/Demeanor/Rapport:  Engaged Affect (typically observed):  Appropriate, Pleasant Orientation:  Oriented to Self, Oriented to Place, Oriented to Situation, Oriented to  Time Alcohol / Substance use:  Never Used Psych involvement (Current and /or in the community):  No (Comment)  Discharge Needs  Concerns to be addressed:  Care Coordination, Discharge Planning Concerns, Financial / Insurance Concerns Readmission within the last 30 days:  No Current discharge risk:  Chronically ill, Lives alone Barriers to Discharge:  [place], LCSW 01/08/2017, 11:09 AM

## 2017-01-08 NOTE — ED Notes (Signed)
Continues to rest quietly at this time.

## 2017-01-08 NOTE — ED Notes (Signed)
This RN called dietary to order diabetic tray at this time.

## 2017-01-08 NOTE — Evaluation (Signed)
Physical Therapy Evaluation Patient Details Name: Chase Simmons MRN: [id number] DOB: 02-Jun-1933 Today's Date: 01/08/2017   History of Present Illness  84 yo male with onset of dizziness and weakness was sent to ED with concerns that he could not stay home.  Has LE edema, CHF, COPD and PMHx:  bladder CA, chest pain, essential tremor, HTN, DM, PAF, atherosclerosis, CHF  Clinical Impression  Pt is up to side of bed with assist and with some significant cues was able to stand with walker and sidestep.  He is very unsteady and motivated to try so should be an excellent candidate for SNF care.  He is willing to go and daughter was there to consult on PLOF and plans.  Will advance his mobility acutely as tolerated and progress with standing and gait to decrease time needed in SNF.      Follow Up Recommendations SNF    Equipment Recommendations  Rolling walker with 5" wheels    Recommendations for Other Services       Precautions / Restrictions Precautions Precautions: Fall(telemetry) Restrictions Weight Bearing Restrictions: No      Mobility  Bed Mobility Overal bed mobility: Needs Assistance Bed Mobility: Supine to Sit;Sit to Supine     Supine to sit: Mod assist Sit to supine: Mod assist;Max assist   General bed mobility comments: trunk assist to sit up and pivot, lifted legs back to bed, very stiff  Transfers Overall transfer level: Needs assistance Equipment used: Rolling walker (2 wheeled);1 person hand held assist Transfers: Sit to/from Stand Sit to Stand: Mod assist         General transfer comment: cues for sequence and safety as pt is not following instructions well initially then much more on point  Ambulation/Gait Ambulation/Gait assistance: Min assist;Mod assist Ambulation Distance (Feet): 10 Feet Assistive device: Rolling walker (2 wheeled);1 person hand held assist Gait Pattern/deviations: Step-to pattern;Decreased weight shift to right;Decreased stride  length;Shuffle;Trunk flexed;Wide base of support Gait velocity: reduced Gait velocity interpretation: Below normal speed for age/gender General Gait Details: sidesteps bedside as pt is buckling at knees and not able to move walker alone  Stairs            Wheelchair Mobility    Modified Rankin (Stroke Patients Only)       Balance Overall balance assessment: Needs assistance Sitting-balance support: Feet supported;Bilateral upper extremity supported Sitting balance-Leahy Scale: Fair Sitting balance - Comments: without hands drifts backward slowly   Standing balance support: Bilateral upper extremity supported;During functional activity Standing balance-Leahy Scale: Poor                               Pertinent Vitals/Pain Pain Assessment: Faces Faces Pain Scale: Hurts even more Pain Location: B knees alternately Pain Descriptors / Indicators: Heaviness;Tender Pain Intervention(s): Limited activity within patient's tolerance;Monitored during session;Repositioned;Patient requesting pain meds-RN notified    Home Living Family/patient expects to be discharged to:: Skilled nursing facility                 Additional Comments: apartment on 2nd floor, 2 flights of stairs very close to apartment, elevator on the other end of the hallway that is difficult to get to from w/c level without assist.    Prior Function Level of Independence: Needs assistance   Gait / Transfers Assistance Needed: mainly walks when PT comes to apt but in wc to self propel otherwise  ADL's / Homemaking Assistance Needed: has  home services for helping bathing   Comments: using his walker rarely and wc is mobility and has been doing transfers to chair without help     Hand Dominance   Dominant Hand: Right    Extremity/Trunk Assessment   Upper Extremity Assessment Upper Extremity Assessment: Overall WFL for tasks assessed    Lower Extremity Assessment Lower Extremity  Assessment: Overall WFL for tasks assessed    Cervical / Trunk Assessment Cervical / Trunk Assessment: Other exceptions(weak core mm's in abdomen, leans back in sitting) Cervical / Trunk Exceptions: weak abd muscles  Communication   Communication: HOH  Cognition Arousal/Alertness: Awake/alert Behavior During Therapy: WFL for tasks assessed/performed Overall Cognitive Status: Within Functional Limits for tasks assessed(short term memory losses are definitely therre)                                 General Comments: pt follows instructions and seems to understand all PT asks of him      General Comments General comments (skin integrity, edema, etc.): pt is covered in fragile skin and smells like old urine    Exercises     Assessment/Plan    PT Assessment Patient needs continued PT services  PT Problem List Decreased range of motion;Decreased activity tolerance;Decreased balance;Decreased mobility;Decreased coordination;Decreased cognition;Decreased knowledge of use of DME;Decreased safety awareness;Obesity;Decreased skin integrity;Pain       PT Treatment Interventions DME instruction;Gait training;Functional mobility training;Therapeutic activities;Therapeutic exercise;Balance training;Neuromuscular re-education;Patient/family education    PT Goals (Current goals can be found in the Care Plan section)  Acute Rehab PT Goals Patient Stated Goal: none stated PT Goal Formulation: With patient/family Time For Goal Achievement: 01/22/17 Potential to Achieve Goals: Good    Frequency Min 2X/week   Barriers to discharge Inaccessible home environment(has stairs per his history to enter apt) lives alone with rare PT services to see him    Co-evaluation               AM-PAC PT "6 Clicks" Daily Activity  Outcome Measure Difficulty turning over in bed (including adjusting bedclothes, sheets and blankets)?: Unable Difficulty moving from lying on back to sitting on  the side of the bed? : Unable Difficulty sitting down on and standing up from a chair with arms (e.g., wheelchair, bedside commode, etc,.)?: Unable Help needed moving to and from a bed to chair (including a wheelchair)?: A Lot Help needed walking in hospital room?: A Lot Help needed climbing 3-5 steps with a railing? : Total 6 Click Score: 8    End of Session Equipment Utilized During Treatment: Gait belt Activity Tolerance: Patient limited by pain Patient left: in bed;with call bell/phone within reach;with bed alarm set;with family/visitor present;with nursing/sitter in room Nurse Communication: Mobility status PT Visit Diagnosis: Other abnormalities of gait and mobility (R26.89);Unsteadiness on feet (R26.81);Difficulty in walking, not elsewhere classified (R26.2);Adult, failure to thrive (R62.7);Pain Pain - Right/Left: (Bilateral) Pain - part of body: Knee    Time: 0902-0935 PT Time Calculation (min) (ACUTE ONLY): 33 min   Charges:   PT Evaluation $PT Eval Moderate Complexity: 1 Mod PT Treatments $Gait Training: 8-22 mins   PT G Codes:   PT G-Codes **NOT FOR INPATIENT CLASS** Functional Assessment Tool Used: AM-PAC 6 Clicks Basic Mobility    Ramond Dial 01/08/2017, 11:04 AM   Mee Hives, PT MS Acute Rehab Dept. Number: [phone] and [place] [id number]

## 2017-01-08 NOTE — ED Notes (Signed)
Patient resting quietly with eyes closed. No acute distress noted.

## 2017-01-08 NOTE — ED Provider Notes (Addendum)
-----------------------------------------   7:24 AM on 01/08/2017 -----------------------------------------   Blood pressure (!) 159/52, pulse 69, temperature 97.8 F (36.6 C), temperature source Oral, resp. rate 18, height 5\' 11"  (1.803 m), weight 108.9 kg (240 lb), SpO2 96 %.  The patient had no acute events since last update.  Calm and cooperative at this time. Disposition is pending clinical social work team recommendations.  Home medications were ordered to give in the emergency department.     Paulette Blanch, MD 01/08/17 2423    Paulette Blanch, MD 01/08/17 [phone]

## 2017-01-08 NOTE — ED Notes (Signed)
Called [facility] rehab requested PT consult for patient ASAP, left message.

## 2017-01-08 NOTE — ED Notes (Signed)
Resting with eyes closed. No acute distress noted.

## 2017-01-08 NOTE — Clinical Social Work Note (Addendum)
CSW had a tentative bed offer from [facility]. However, they are unable to accept today as they need prior authorization since they are out of network with this plan. CSW is continuing to follow.  CSW has contacted all in-network options in [place] and [place] for SNF placement, and all options have declined. In addition, the CSW contacted options out of network, and they all declined acceptance. CSW has spoken to the patient's daughter and updated the continued barrier for STR placement. The plan is for the patient to return home with resumption of home health PT with addition of SW and aide. The patient's daughter is understandably disappointed; however, she recognizes the insurance barrier. The patient will discharge via non-emergent EMS. CSW will continue to follow should additional needs arise.  Santiago Bumpers, MSW, Latanya Presser [phone]

## 2017-01-08 NOTE — Discharge Instructions (Signed)
Please seek medical attention for any high fevers, chest pain, shortness of breath, change in behavior, persistent vomiting, bloody stool or any other new or concerning symptoms.  

## 2017-01-11 DIAGNOSIS — K8020 Calculus of gallbladder without cholecystitis without obstruction: Secondary | ICD-10-CM | POA: Diagnosis not present

## 2017-01-11 DIAGNOSIS — M6281 Muscle weakness (generalized): Secondary | ICD-10-CM | POA: Diagnosis not present

## 2017-01-11 DIAGNOSIS — J449 Chronic obstructive pulmonary disease, unspecified: Secondary | ICD-10-CM | POA: Diagnosis not present

## 2017-01-11 DIAGNOSIS — G40909 Epilepsy, unspecified, not intractable, without status epilepticus: Secondary | ICD-10-CM | POA: Diagnosis not present

## 2017-01-11 DIAGNOSIS — I480 Paroxysmal atrial fibrillation: Secondary | ICD-10-CM | POA: Diagnosis not present

## 2017-01-11 DIAGNOSIS — I110 Hypertensive heart disease with heart failure: Secondary | ICD-10-CM | POA: Diagnosis not present

## 2017-01-11 DIAGNOSIS — E119 Type 2 diabetes mellitus without complications: Secondary | ICD-10-CM | POA: Diagnosis not present

## 2017-01-11 DIAGNOSIS — I2510 Atherosclerotic heart disease of native coronary artery without angina pectoris: Secondary | ICD-10-CM | POA: Diagnosis not present

## 2017-01-11 DIAGNOSIS — G250 Essential tremor: Secondary | ICD-10-CM | POA: Diagnosis not present

## 2017-01-11 DIAGNOSIS — I5020 Unspecified systolic (congestive) heart failure: Secondary | ICD-10-CM | POA: Diagnosis not present

## 2017-01-12 DIAGNOSIS — K8020 Calculus of gallbladder without cholecystitis without obstruction: Secondary | ICD-10-CM | POA: Diagnosis not present

## 2017-01-12 DIAGNOSIS — M6281 Muscle weakness (generalized): Secondary | ICD-10-CM | POA: Diagnosis not present

## 2017-01-12 DIAGNOSIS — E119 Type 2 diabetes mellitus without complications: Secondary | ICD-10-CM | POA: Diagnosis not present

## 2017-01-12 DIAGNOSIS — J449 Chronic obstructive pulmonary disease, unspecified: Secondary | ICD-10-CM | POA: Diagnosis not present

## 2017-01-12 DIAGNOSIS — I2510 Atherosclerotic heart disease of native coronary artery without angina pectoris: Secondary | ICD-10-CM | POA: Diagnosis not present

## 2017-01-12 DIAGNOSIS — I110 Hypertensive heart disease with heart failure: Secondary | ICD-10-CM | POA: Diagnosis not present

## 2017-01-12 DIAGNOSIS — I480 Paroxysmal atrial fibrillation: Secondary | ICD-10-CM | POA: Diagnosis not present

## 2017-01-12 DIAGNOSIS — G250 Essential tremor: Secondary | ICD-10-CM | POA: Diagnosis not present

## 2017-01-12 DIAGNOSIS — I5020 Unspecified systolic (congestive) heart failure: Secondary | ICD-10-CM | POA: Diagnosis not present

## 2017-01-12 DIAGNOSIS — G40909 Epilepsy, unspecified, not intractable, without status epilepticus: Secondary | ICD-10-CM | POA: Diagnosis not present

## 2017-01-16 ENCOUNTER — Ambulatory Visit (INDEPENDENT_AMBULATORY_CARE_PROVIDER_SITE_OTHER): Payer: Medicare HMO

## 2017-01-16 ENCOUNTER — Other Ambulatory Visit: Payer: Self-pay

## 2017-01-16 DIAGNOSIS — I428 Other cardiomyopathies: Secondary | ICD-10-CM | POA: Diagnosis not present

## 2017-01-17 ENCOUNTER — Ambulatory Visit: Payer: Medicare HMO | Admitting: Cardiovascular Disease

## 2017-01-17 ENCOUNTER — Telehealth: Payer: Self-pay | Admitting: Family Medicine

## 2017-01-17 DIAGNOSIS — M6281 Muscle weakness (generalized): Secondary | ICD-10-CM | POA: Diagnosis not present

## 2017-01-17 DIAGNOSIS — G40909 Epilepsy, unspecified, not intractable, without status epilepticus: Secondary | ICD-10-CM | POA: Diagnosis not present

## 2017-01-17 DIAGNOSIS — I5020 Unspecified systolic (congestive) heart failure: Secondary | ICD-10-CM | POA: Diagnosis not present

## 2017-01-17 DIAGNOSIS — I110 Hypertensive heart disease with heart failure: Secondary | ICD-10-CM | POA: Diagnosis not present

## 2017-01-17 DIAGNOSIS — J449 Chronic obstructive pulmonary disease, unspecified: Secondary | ICD-10-CM | POA: Diagnosis not present

## 2017-01-17 DIAGNOSIS — K8020 Calculus of gallbladder without cholecystitis without obstruction: Secondary | ICD-10-CM | POA: Diagnosis not present

## 2017-01-17 DIAGNOSIS — E119 Type 2 diabetes mellitus without complications: Secondary | ICD-10-CM | POA: Diagnosis not present

## 2017-01-17 DIAGNOSIS — G250 Essential tremor: Secondary | ICD-10-CM | POA: Diagnosis not present

## 2017-01-17 DIAGNOSIS — I2510 Atherosclerotic heart disease of native coronary artery without angina pectoris: Secondary | ICD-10-CM | POA: Diagnosis not present

## 2017-01-17 DIAGNOSIS — I480 Paroxysmal atrial fibrillation: Secondary | ICD-10-CM | POA: Diagnosis not present

## 2017-01-17 NOTE — Telephone Encounter (Signed)
Stephanie has been informed. 

## 2017-01-17 NOTE — Telephone Encounter (Signed)
Copied from [place]. Topic: General - Other >> Jan 17, 2017 10:38 AM Darl Householder, RMA wrote: Reason for CRM: Colletta Maryland from [facility] home health is requesting verbal orders for OT once a week x1 week for recert visit, please return call at [phone], pt is having declined function, also order for social work as well

## 2017-01-17 NOTE — Telephone Encounter (Signed)
Please advise 

## 2017-01-17 NOTE — Telephone Encounter (Signed)
Verbal orders can be given. 

## 2017-01-17 NOTE — Progress Notes (Deleted)
Cardiology Office Note   Date:  01/17/2017   ID:  Chase Simmons, DOB [birth year], MRN [id number]  PCP:  Leone Haven, MD  Cardiologist:   Kathlyn Sacramento, MD   No chief complaint on file.     History of Present Illness: Chase Simmons is a 84 y.o. male who presents for a follow-up visit regarding paroxysmal atrial fibrillation and chronic systolic heart failure.  Other medical problems include recurrent falls, hypertension, hyperlipidemia, nonobstructive coronary artery disease, diabetes mellitus and bladder cancer. He was hospitalized in July 2018 with nausea and vomiting and was found to have new cardiomyopathy with an EF of 30-35% with mid to distal hypokinesis.  Cardiac catheterization showed mild nonobstructive coronary artery disease.  Wall motion abnormalities were highly suggestive of stress-induced cardiomyopathy. He had a repeat echocardiogram yesterday which showed improvement in ejection fraction to 45-50% with mild mitral regurgitation.    Past Medical History:  Diagnosis Date  . Allergy   . Bladder cancer ([place])    a. followed by Dr. Jacqlyn Larsen  . Chronic combined systolic (congestive) and diastolic (congestive) heart failure ([place])    a. 07/2016 Echo: EF 30-35%, sev mid-apicalanteroseptal, ant, inf HK, Gr1 DD.  Marland Kitchen COPD (chronic obstructive pulmonary disease) ([place])   . Hyperlipidemia   . Hypertension   . NICM (nonischemic cardiomyopathy) ([place])    a. 07/2016 Echo: EF 30-35%, sev mid-apicalanteroseptal, ant, inf HK, Gr1 DD, mild MR, mildly dil LA, PASP 35mHg - ? stress induced CM.  .Marland KitchenNonobstructive Coronary atherosclerosis    a. 07/2016 Cath: LM nl, LAD nl, D1 40ost, LCX 258mRCA nl, EF 25-30%.  . Marland KitchenAF (paroxysmal atrial fibrillation) ([place]   a. Dx 08/2013. CHA2DS2VASc = 6-->no OAC 2/2 h/o falls and nocompliance.  . Tremor, essential    sees [facility] neurologist  . Type 2 diabetes mellitus with complication, without long-term current use of insulin ([facility] [place]7/30/2014  .  Urinary incontinence     Past Surgical History:  Procedure Laterality Date  . bladder cancer    . CATARACT EXTRACTION    . HERNIA REPAIR    . LEFT HEART CATH AND CORONARY ANGIOGRAPHY N/A 07/11/2016   Procedure: Left Heart Cath and Coronary Angiography;  Surgeon: ArWellington HampshireMD;  [facility];  Service: Cardiovascular;  Laterality: N/A;     Current Outpatient Medications  Medication Sig Dispense Refill  . acetaminophen (TYLENOL) 325 MG tablet Take 2 tablets (650 mg total) by mouth every 6 (six) hours as needed for mild pain (or Fever >/= 101).    . Marland KitchenLPRAZolam (XANAX) 0.25 MG tablet take 1 tablet by mouth every 12 hours if needed for anxiety 20 tablet 1  . aspirin EC 81 MG tablet Take 1 tablet (81 mg total) by mouth daily. 90 tablet 3  . atorvastatin (LIPITOR) 80 MG tablet TAKE 1 TABLET ONE TIME DAILY 90 tablet 3  . blood glucose meter kit and supplies KIT Dispense based on patient and insurance preference. Use up to four times daily as directed. 1 each 0  . fexofenadine (ALLEGRA) 180 MG tablet Take 180 mg by mouth daily as needed for allergies or rhinitis.    . Marland Kitchenlimepiride (AMARYL) 4 MG tablet take 1 tablet by mouth WITH BREAKFAST. 90 tablet 1  . lisinopril (PRINIVIL,ZESTRIL) 20 MG tablet Take 1 tablet (20 mg total) by mouth daily. 90 tablet 1  . meclizine (ANTIVERT) 12.5 MG tablet take 1 tablet by mouth three times a day if  needed for dizziness 30 tablet 0  . metFORMIN (GLUCOPHAGE) 1000 MG tablet Take 1 tablet (1,000 mg total) 2 (two) times daily with a meal by mouth. 60 tablet 6  . pantoprazole (PROTONIX) 40 MG tablet Take 1 tablet (40 mg total) by mouth daily. 90 tablet 1  . PARoxetine (PAXIL) 40 MG tablet Take 1 tablet (40 mg total) by mouth daily. 90 tablet 1  . primidone (MYSOLINE) 50 MG tablet Take 1.5 tablets (75 mg total) by mouth 2 (two) times daily. 270 tablet 1  . tiotropium (SPIRIVA) 18 MCG inhalation capsule Place 18 mcg as needed into inhaler and  inhale.      No current facility-administered medications for this visit.     Allergies:   Latex    Social History:  The patient  reports that he has quit smoking. He has a 60.00 pack-year smoking history. he has never used smokeless tobacco. He reports that he does not drink alcohol or use drugs.   Family History:  The patient's ***family history includes Arthritis in his mother; Cancer in his maternal aunt and maternal uncle; Dementia in his mother.    ROS:  Please see the history of present illness.   Otherwise, review of systems are positive for {NONE DEFAULTED:18576::"none"}.   All other systems are reviewed and negative.    PHYSICAL EXAM: VS:  There were no vitals taken for this visit. , BMI There is no height or weight on file to calculate BMI. GEN: Well nourished, well developed, in no acute distress  HEENT: normal  Neck: no JVD, carotid bruits, or masses Cardiac: ***RRR; no murmurs, rubs, or gallops,no edema  Respiratory:  clear to auscultation bilaterally, normal work of breathing GI: soft, nontender, nondistended, + BS MS: no deformity or atrophy  Skin: warm and dry, no rash Neuro:  Strength and sensation are intact Psych: euthymic mood, full affect   EKG:  EKG {ACTION; IS/IS ZOX:[id number]} ordered today. The ekg ordered today demonstrates ***   Recent Labs: 07/10/2016: Magnesium 1.7 11/08/2016: Brain Natriuretic Peptide 51; TSH 1.50 01/07/2017: ALT 20; BUN 31; Creatinine, Ser 1.05; Hemoglobin 13.1; Platelets 133; Potassium 4.0; Sodium 138    Lipid Panel    Component Value Date/Time   CHOL 105 07/11/2016 0305   TRIG 54 07/11/2016 0305   HDL 41 07/11/2016 0305   CHOLHDL 2.6 07/11/2016 0305   VLDL 11 07/11/2016 0305   LDLCALC 53 07/11/2016 0305   LDLDIRECT 69.0 04/01/2014 1553      Wt Readings from Last 3 Encounters:  01/07/17 240 lb (108.9 kg)  12/31/16 237 lb (107.5 kg)  11/18/16 242 lb 8 oz (110 kg)      Other studies Reviewed: Additional studies/  records that were reviewed today include: ***. Review of the above records demonstrates: ***  PAD Screen 04/13/2016  Previous PAD dx? No  Previous surgical procedure? No  Pain with walking? No  Feet/toe relief with dangling? No  Painful, non-healing ulcers? No  Extremities discolored? No      ASSESSMENT AND PLAN:  1.  ***    Disposition:   FU with *** in {gen number [id number]:[id number]} {Days to years:10300}  Signed,  Kathlyn Sacramento, MD  01/17/2017 3:12 PM    [place]

## 2017-01-19 ENCOUNTER — Encounter: Payer: Self-pay | Admitting: Cardiovascular Disease

## 2017-01-19 DIAGNOSIS — I5020 Unspecified systolic (congestive) heart failure: Secondary | ICD-10-CM | POA: Diagnosis not present

## 2017-01-19 DIAGNOSIS — G250 Essential tremor: Secondary | ICD-10-CM | POA: Diagnosis not present

## 2017-01-19 DIAGNOSIS — I480 Paroxysmal atrial fibrillation: Secondary | ICD-10-CM | POA: Diagnosis not present

## 2017-01-19 DIAGNOSIS — G40909 Epilepsy, unspecified, not intractable, without status epilepticus: Secondary | ICD-10-CM | POA: Diagnosis not present

## 2017-01-19 DIAGNOSIS — E119 Type 2 diabetes mellitus without complications: Secondary | ICD-10-CM | POA: Diagnosis not present

## 2017-01-19 DIAGNOSIS — I110 Hypertensive heart disease with heart failure: Secondary | ICD-10-CM | POA: Diagnosis not present

## 2017-01-19 DIAGNOSIS — J449 Chronic obstructive pulmonary disease, unspecified: Secondary | ICD-10-CM | POA: Diagnosis not present

## 2017-01-19 DIAGNOSIS — M6281 Muscle weakness (generalized): Secondary | ICD-10-CM | POA: Diagnosis not present

## 2017-01-19 DIAGNOSIS — K8020 Calculus of gallbladder without cholecystitis without obstruction: Secondary | ICD-10-CM | POA: Diagnosis not present

## 2017-01-19 DIAGNOSIS — I2510 Atherosclerotic heart disease of native coronary artery without angina pectoris: Secondary | ICD-10-CM | POA: Diagnosis not present

## 2017-01-20 ENCOUNTER — Telehealth: Payer: Self-pay | Admitting: Family Medicine

## 2017-01-20 DIAGNOSIS — I110 Hypertensive heart disease with heart failure: Secondary | ICD-10-CM | POA: Diagnosis not present

## 2017-01-20 DIAGNOSIS — I5020 Unspecified systolic (congestive) heart failure: Secondary | ICD-10-CM | POA: Diagnosis not present

## 2017-01-20 DIAGNOSIS — J449 Chronic obstructive pulmonary disease, unspecified: Secondary | ICD-10-CM | POA: Diagnosis not present

## 2017-01-20 DIAGNOSIS — I480 Paroxysmal atrial fibrillation: Secondary | ICD-10-CM | POA: Diagnosis not present

## 2017-01-20 DIAGNOSIS — G250 Essential tremor: Secondary | ICD-10-CM | POA: Diagnosis not present

## 2017-01-20 DIAGNOSIS — K8020 Calculus of gallbladder without cholecystitis without obstruction: Secondary | ICD-10-CM | POA: Diagnosis not present

## 2017-01-20 DIAGNOSIS — M6281 Muscle weakness (generalized): Secondary | ICD-10-CM | POA: Diagnosis not present

## 2017-01-20 DIAGNOSIS — G40909 Epilepsy, unspecified, not intractable, without status epilepticus: Secondary | ICD-10-CM | POA: Diagnosis not present

## 2017-01-20 DIAGNOSIS — E119 Type 2 diabetes mellitus without complications: Secondary | ICD-10-CM | POA: Diagnosis not present

## 2017-01-20 DIAGNOSIS — I2510 Atherosclerotic heart disease of native coronary artery without angina pectoris: Secondary | ICD-10-CM | POA: Diagnosis not present

## 2017-01-20 MED ORDER — ALPRAZOLAM 0.25 MG PO TABS: ORAL_TABLET | ORAL | 1 refills | Status: DC

## 2017-01-20 MED ORDER — MECLIZINE HCL 12.5 MG PO TABS: 12.5000 mg | ORAL_TABLET | Freq: Three times a day (TID) | ORAL | 0 refills | Status: DC | PRN

## 2017-01-20 NOTE — Telephone Encounter (Signed)
Please advise 

## 2017-01-20 NOTE — Telephone Encounter (Signed)
Copied from [place] [id number]. Topic: General - Other >> Jan 20, 2017  8:14 AM Carolyn Stare wrote:  Colletta Maryland with [facility] home health cal lto ask for verbal orders OT and PT   [phone]

## 2017-01-20 NOTE — Telephone Encounter (Signed)
Requesting refill of Xanax and Antivert  LOV 11/08/16 with Dr. Caryl Bis

## 2017-01-20 NOTE — Telephone Encounter (Signed)
Refills given.  Please fax Xanax.  Please inform patient that if he becomes drowsy with either of these medicines he needs to let us know.  Patient also needs a follow-up visit scheduled.  Thanks.

## 2017-01-20 NOTE — Telephone Encounter (Signed)
Patient will call back to schedule and was notified about the medication

## 2017-01-20 NOTE — Telephone Encounter (Signed)
Last OV 11/08/16 last filled  Meclizine 10/31/16 30 0rf Alprazolam 07/25/16 20 1rf

## 2017-01-20 NOTE — Telephone Encounter (Signed)
Copied from [place] [id number]. Topic: Quick Communication - Rx Refill/Question >> Jan 20, 2017  8:28 AM Aurelio Brash B wrote: Medication: meclizine (ANTIVERT) 12.5 MG tablet and ALPRAZolam (XANAX) 0.25 MG tablet   Has the patient contacted their pharmacy? New pharmacy  Pt is out of these meds  (Agent: If no, request that the patient contact the pharmacy for the refill.)   Preferred Pharmacy (with phone number or street name):[id number] [place] [address] [place] [id number] (Phone) [phone] (Fax)     Agent: Please be advised that RX refills may take up to 3 business days. We ask that you follow-up with your pharmacy.

## 2017-01-20 NOTE — Telephone Encounter (Signed)
Verbal order given  

## 2017-01-23 DIAGNOSIS — G250 Essential tremor: Secondary | ICD-10-CM | POA: Diagnosis not present

## 2017-01-23 DIAGNOSIS — E119 Type 2 diabetes mellitus without complications: Secondary | ICD-10-CM | POA: Diagnosis not present

## 2017-01-23 DIAGNOSIS — I110 Hypertensive heart disease with heart failure: Secondary | ICD-10-CM | POA: Diagnosis not present

## 2017-01-23 DIAGNOSIS — J449 Chronic obstructive pulmonary disease, unspecified: Secondary | ICD-10-CM | POA: Diagnosis not present

## 2017-01-23 DIAGNOSIS — K8020 Calculus of gallbladder without cholecystitis without obstruction: Secondary | ICD-10-CM | POA: Diagnosis not present

## 2017-01-23 DIAGNOSIS — I2510 Atherosclerotic heart disease of native coronary artery without angina pectoris: Secondary | ICD-10-CM | POA: Diagnosis not present

## 2017-01-23 DIAGNOSIS — I480 Paroxysmal atrial fibrillation: Secondary | ICD-10-CM | POA: Diagnosis not present

## 2017-01-23 DIAGNOSIS — G40909 Epilepsy, unspecified, not intractable, without status epilepticus: Secondary | ICD-10-CM | POA: Diagnosis not present

## 2017-01-23 DIAGNOSIS — I5020 Unspecified systolic (congestive) heart failure: Secondary | ICD-10-CM | POA: Diagnosis not present

## 2017-01-23 DIAGNOSIS — M6281 Muscle weakness (generalized): Secondary | ICD-10-CM | POA: Diagnosis not present

## 2017-01-24 NOTE — Telephone Encounter (Signed)
Verbal orders can be given. 

## 2017-01-24 NOTE — Telephone Encounter (Signed)
Please advise 

## 2017-01-24 NOTE — Telephone Encounter (Unsigned)
Copied from [place]. Topic: General - Other >> Jan 24, 2017 12:09 PM Carolyn Stare wrote:  Colletta Maryland a OT with [facility] home health call to ask for vertl orders 2 times a week for 5 weeks   home health aide 1 x 1 ,1 week ,2 x 4     Phones [phone]

## 2017-01-25 NOTE — Telephone Encounter (Signed)
Verbal order given  

## 2017-01-30 DIAGNOSIS — M6281 Muscle weakness (generalized): Secondary | ICD-10-CM | POA: Diagnosis not present

## 2017-01-30 DIAGNOSIS — I110 Hypertensive heart disease with heart failure: Secondary | ICD-10-CM | POA: Diagnosis not present

## 2017-01-30 DIAGNOSIS — J449 Chronic obstructive pulmonary disease, unspecified: Secondary | ICD-10-CM | POA: Diagnosis not present

## 2017-01-30 DIAGNOSIS — I5020 Unspecified systolic (congestive) heart failure: Secondary | ICD-10-CM | POA: Diagnosis not present

## 2017-01-30 DIAGNOSIS — I2510 Atherosclerotic heart disease of native coronary artery without angina pectoris: Secondary | ICD-10-CM | POA: Diagnosis not present

## 2017-01-30 DIAGNOSIS — G250 Essential tremor: Secondary | ICD-10-CM | POA: Diagnosis not present

## 2017-01-30 DIAGNOSIS — E119 Type 2 diabetes mellitus without complications: Secondary | ICD-10-CM | POA: Diagnosis not present

## 2017-01-30 DIAGNOSIS — I480 Paroxysmal atrial fibrillation: Secondary | ICD-10-CM | POA: Diagnosis not present

## 2017-01-30 DIAGNOSIS — G40909 Epilepsy, unspecified, not intractable, without status epilepticus: Secondary | ICD-10-CM | POA: Diagnosis not present

## 2017-01-30 DIAGNOSIS — K8020 Calculus of gallbladder without cholecystitis without obstruction: Secondary | ICD-10-CM | POA: Diagnosis not present

## 2017-01-31 DIAGNOSIS — M6281 Muscle weakness (generalized): Secondary | ICD-10-CM | POA: Diagnosis not present

## 2017-01-31 DIAGNOSIS — G250 Essential tremor: Secondary | ICD-10-CM | POA: Diagnosis not present

## 2017-01-31 DIAGNOSIS — E119 Type 2 diabetes mellitus without complications: Secondary | ICD-10-CM | POA: Diagnosis not present

## 2017-01-31 DIAGNOSIS — K8020 Calculus of gallbladder without cholecystitis without obstruction: Secondary | ICD-10-CM | POA: Diagnosis not present

## 2017-01-31 DIAGNOSIS — I480 Paroxysmal atrial fibrillation: Secondary | ICD-10-CM | POA: Diagnosis not present

## 2017-01-31 DIAGNOSIS — I2510 Atherosclerotic heart disease of native coronary artery without angina pectoris: Secondary | ICD-10-CM | POA: Diagnosis not present

## 2017-01-31 DIAGNOSIS — J449 Chronic obstructive pulmonary disease, unspecified: Secondary | ICD-10-CM | POA: Diagnosis not present

## 2017-01-31 DIAGNOSIS — I5020 Unspecified systolic (congestive) heart failure: Secondary | ICD-10-CM | POA: Diagnosis not present

## 2017-01-31 DIAGNOSIS — G40909 Epilepsy, unspecified, not intractable, without status epilepticus: Secondary | ICD-10-CM | POA: Diagnosis not present

## 2017-01-31 DIAGNOSIS — I110 Hypertensive heart disease with heart failure: Secondary | ICD-10-CM | POA: Diagnosis not present

## 2017-02-01 DIAGNOSIS — I5020 Unspecified systolic (congestive) heart failure: Secondary | ICD-10-CM | POA: Diagnosis not present

## 2017-02-01 DIAGNOSIS — G40909 Epilepsy, unspecified, not intractable, without status epilepticus: Secondary | ICD-10-CM | POA: Diagnosis not present

## 2017-02-01 DIAGNOSIS — J449 Chronic obstructive pulmonary disease, unspecified: Secondary | ICD-10-CM | POA: Diagnosis not present

## 2017-02-01 DIAGNOSIS — E119 Type 2 diabetes mellitus without complications: Secondary | ICD-10-CM | POA: Diagnosis not present

## 2017-02-01 DIAGNOSIS — I2510 Atherosclerotic heart disease of native coronary artery without angina pectoris: Secondary | ICD-10-CM | POA: Diagnosis not present

## 2017-02-01 DIAGNOSIS — M6281 Muscle weakness (generalized): Secondary | ICD-10-CM | POA: Diagnosis not present

## 2017-02-01 DIAGNOSIS — G250 Essential tremor: Secondary | ICD-10-CM | POA: Diagnosis not present

## 2017-02-01 DIAGNOSIS — K8020 Calculus of gallbladder without cholecystitis without obstruction: Secondary | ICD-10-CM | POA: Diagnosis not present

## 2017-02-01 DIAGNOSIS — I480 Paroxysmal atrial fibrillation: Secondary | ICD-10-CM | POA: Diagnosis not present

## 2017-02-01 DIAGNOSIS — I110 Hypertensive heart disease with heart failure: Secondary | ICD-10-CM | POA: Diagnosis not present

## 2017-02-02 DIAGNOSIS — I2510 Atherosclerotic heart disease of native coronary artery without angina pectoris: Secondary | ICD-10-CM | POA: Diagnosis not present

## 2017-02-02 DIAGNOSIS — I110 Hypertensive heart disease with heart failure: Secondary | ICD-10-CM | POA: Diagnosis not present

## 2017-02-02 DIAGNOSIS — I5020 Unspecified systolic (congestive) heart failure: Secondary | ICD-10-CM | POA: Diagnosis not present

## 2017-02-02 DIAGNOSIS — J449 Chronic obstructive pulmonary disease, unspecified: Secondary | ICD-10-CM | POA: Diagnosis not present

## 2017-02-02 DIAGNOSIS — K8020 Calculus of gallbladder without cholecystitis without obstruction: Secondary | ICD-10-CM | POA: Diagnosis not present

## 2017-02-02 DIAGNOSIS — E119 Type 2 diabetes mellitus without complications: Secondary | ICD-10-CM | POA: Diagnosis not present

## 2017-02-02 DIAGNOSIS — M6281 Muscle weakness (generalized): Secondary | ICD-10-CM | POA: Diagnosis not present

## 2017-02-02 DIAGNOSIS — G40909 Epilepsy, unspecified, not intractable, without status epilepticus: Secondary | ICD-10-CM | POA: Diagnosis not present

## 2017-02-02 DIAGNOSIS — G250 Essential tremor: Secondary | ICD-10-CM | POA: Diagnosis not present

## 2017-02-02 DIAGNOSIS — I480 Paroxysmal atrial fibrillation: Secondary | ICD-10-CM | POA: Diagnosis not present

## 2017-02-06 DIAGNOSIS — E119 Type 2 diabetes mellitus without complications: Secondary | ICD-10-CM | POA: Diagnosis not present

## 2017-02-06 DIAGNOSIS — I5020 Unspecified systolic (congestive) heart failure: Secondary | ICD-10-CM | POA: Diagnosis not present

## 2017-02-06 DIAGNOSIS — G250 Essential tremor: Secondary | ICD-10-CM | POA: Diagnosis not present

## 2017-02-06 DIAGNOSIS — G40909 Epilepsy, unspecified, not intractable, without status epilepticus: Secondary | ICD-10-CM | POA: Diagnosis not present

## 2017-02-06 DIAGNOSIS — I110 Hypertensive heart disease with heart failure: Secondary | ICD-10-CM | POA: Diagnosis not present

## 2017-02-06 DIAGNOSIS — M6281 Muscle weakness (generalized): Secondary | ICD-10-CM | POA: Diagnosis not present

## 2017-02-06 DIAGNOSIS — I480 Paroxysmal atrial fibrillation: Secondary | ICD-10-CM | POA: Diagnosis not present

## 2017-02-06 DIAGNOSIS — I2510 Atherosclerotic heart disease of native coronary artery without angina pectoris: Secondary | ICD-10-CM | POA: Diagnosis not present

## 2017-02-06 DIAGNOSIS — K8020 Calculus of gallbladder without cholecystitis without obstruction: Secondary | ICD-10-CM | POA: Diagnosis not present

## 2017-02-06 DIAGNOSIS — J449 Chronic obstructive pulmonary disease, unspecified: Secondary | ICD-10-CM | POA: Diagnosis not present

## 2017-02-07 DIAGNOSIS — I5020 Unspecified systolic (congestive) heart failure: Secondary | ICD-10-CM | POA: Diagnosis not present

## 2017-02-07 DIAGNOSIS — G40909 Epilepsy, unspecified, not intractable, without status epilepticus: Secondary | ICD-10-CM | POA: Diagnosis not present

## 2017-02-07 DIAGNOSIS — E119 Type 2 diabetes mellitus without complications: Secondary | ICD-10-CM | POA: Diagnosis not present

## 2017-02-07 DIAGNOSIS — I2510 Atherosclerotic heart disease of native coronary artery without angina pectoris: Secondary | ICD-10-CM | POA: Diagnosis not present

## 2017-02-07 DIAGNOSIS — I480 Paroxysmal atrial fibrillation: Secondary | ICD-10-CM | POA: Diagnosis not present

## 2017-02-07 DIAGNOSIS — I110 Hypertensive heart disease with heart failure: Secondary | ICD-10-CM | POA: Diagnosis not present

## 2017-02-07 DIAGNOSIS — J449 Chronic obstructive pulmonary disease, unspecified: Secondary | ICD-10-CM | POA: Diagnosis not present

## 2017-02-07 DIAGNOSIS — K8020 Calculus of gallbladder without cholecystitis without obstruction: Secondary | ICD-10-CM | POA: Diagnosis not present

## 2017-02-07 DIAGNOSIS — G250 Essential tremor: Secondary | ICD-10-CM | POA: Diagnosis not present

## 2017-02-07 DIAGNOSIS — M6281 Muscle weakness (generalized): Secondary | ICD-10-CM | POA: Diagnosis not present

## 2017-02-08 DIAGNOSIS — G40909 Epilepsy, unspecified, not intractable, without status epilepticus: Secondary | ICD-10-CM | POA: Diagnosis not present

## 2017-02-08 DIAGNOSIS — E119 Type 2 diabetes mellitus without complications: Secondary | ICD-10-CM | POA: Diagnosis not present

## 2017-02-08 DIAGNOSIS — I5020 Unspecified systolic (congestive) heart failure: Secondary | ICD-10-CM | POA: Diagnosis not present

## 2017-02-08 DIAGNOSIS — I110 Hypertensive heart disease with heart failure: Secondary | ICD-10-CM | POA: Diagnosis not present

## 2017-02-08 DIAGNOSIS — J449 Chronic obstructive pulmonary disease, unspecified: Secondary | ICD-10-CM | POA: Diagnosis not present

## 2017-02-08 DIAGNOSIS — G250 Essential tremor: Secondary | ICD-10-CM | POA: Diagnosis not present

## 2017-02-08 DIAGNOSIS — I480 Paroxysmal atrial fibrillation: Secondary | ICD-10-CM | POA: Diagnosis not present

## 2017-02-08 DIAGNOSIS — M6281 Muscle weakness (generalized): Secondary | ICD-10-CM | POA: Diagnosis not present

## 2017-02-08 DIAGNOSIS — K8020 Calculus of gallbladder without cholecystitis without obstruction: Secondary | ICD-10-CM | POA: Diagnosis not present

## 2017-02-08 DIAGNOSIS — I2510 Atherosclerotic heart disease of native coronary artery without angina pectoris: Secondary | ICD-10-CM | POA: Diagnosis not present

## 2017-02-09 DIAGNOSIS — M6281 Muscle weakness (generalized): Secondary | ICD-10-CM | POA: Diagnosis not present

## 2017-02-09 DIAGNOSIS — G250 Essential tremor: Secondary | ICD-10-CM | POA: Diagnosis not present

## 2017-02-09 DIAGNOSIS — I480 Paroxysmal atrial fibrillation: Secondary | ICD-10-CM | POA: Diagnosis not present

## 2017-02-09 DIAGNOSIS — J449 Chronic obstructive pulmonary disease, unspecified: Secondary | ICD-10-CM | POA: Diagnosis not present

## 2017-02-09 DIAGNOSIS — K8020 Calculus of gallbladder without cholecystitis without obstruction: Secondary | ICD-10-CM | POA: Diagnosis not present

## 2017-02-09 DIAGNOSIS — I5020 Unspecified systolic (congestive) heart failure: Secondary | ICD-10-CM | POA: Diagnosis not present

## 2017-02-09 DIAGNOSIS — G40909 Epilepsy, unspecified, not intractable, without status epilepticus: Secondary | ICD-10-CM | POA: Diagnosis not present

## 2017-02-09 DIAGNOSIS — I2510 Atherosclerotic heart disease of native coronary artery without angina pectoris: Secondary | ICD-10-CM | POA: Diagnosis not present

## 2017-02-09 DIAGNOSIS — I110 Hypertensive heart disease with heart failure: Secondary | ICD-10-CM | POA: Diagnosis not present

## 2017-02-09 DIAGNOSIS — E119 Type 2 diabetes mellitus without complications: Secondary | ICD-10-CM | POA: Diagnosis not present

## 2017-02-10 DIAGNOSIS — I110 Hypertensive heart disease with heart failure: Secondary | ICD-10-CM | POA: Diagnosis not present

## 2017-02-10 DIAGNOSIS — I5020 Unspecified systolic (congestive) heart failure: Secondary | ICD-10-CM | POA: Diagnosis not present

## 2017-02-10 DIAGNOSIS — G40909 Epilepsy, unspecified, not intractable, without status epilepticus: Secondary | ICD-10-CM | POA: Diagnosis not present

## 2017-02-10 DIAGNOSIS — K8020 Calculus of gallbladder without cholecystitis without obstruction: Secondary | ICD-10-CM | POA: Diagnosis not present

## 2017-02-10 DIAGNOSIS — M6281 Muscle weakness (generalized): Secondary | ICD-10-CM | POA: Diagnosis not present

## 2017-02-10 DIAGNOSIS — J449 Chronic obstructive pulmonary disease, unspecified: Secondary | ICD-10-CM | POA: Diagnosis not present

## 2017-02-10 DIAGNOSIS — G250 Essential tremor: Secondary | ICD-10-CM | POA: Diagnosis not present

## 2017-02-10 DIAGNOSIS — I2510 Atherosclerotic heart disease of native coronary artery without angina pectoris: Secondary | ICD-10-CM | POA: Diagnosis not present

## 2017-02-10 DIAGNOSIS — E119 Type 2 diabetes mellitus without complications: Secondary | ICD-10-CM | POA: Diagnosis not present

## 2017-02-10 DIAGNOSIS — I480 Paroxysmal atrial fibrillation: Secondary | ICD-10-CM | POA: Diagnosis not present

## 2017-02-13 DIAGNOSIS — E119 Type 2 diabetes mellitus without complications: Secondary | ICD-10-CM | POA: Diagnosis not present

## 2017-02-13 DIAGNOSIS — G250 Essential tremor: Secondary | ICD-10-CM | POA: Diagnosis not present

## 2017-02-13 DIAGNOSIS — I2510 Atherosclerotic heart disease of native coronary artery without angina pectoris: Secondary | ICD-10-CM | POA: Diagnosis not present

## 2017-02-13 DIAGNOSIS — I5020 Unspecified systolic (congestive) heart failure: Secondary | ICD-10-CM | POA: Diagnosis not present

## 2017-02-13 DIAGNOSIS — J449 Chronic obstructive pulmonary disease, unspecified: Secondary | ICD-10-CM | POA: Diagnosis not present

## 2017-02-13 DIAGNOSIS — M6281 Muscle weakness (generalized): Secondary | ICD-10-CM | POA: Diagnosis not present

## 2017-02-13 DIAGNOSIS — I480 Paroxysmal atrial fibrillation: Secondary | ICD-10-CM | POA: Diagnosis not present

## 2017-02-13 DIAGNOSIS — I110 Hypertensive heart disease with heart failure: Secondary | ICD-10-CM | POA: Diagnosis not present

## 2017-02-13 DIAGNOSIS — K8020 Calculus of gallbladder without cholecystitis without obstruction: Secondary | ICD-10-CM | POA: Diagnosis not present

## 2017-02-13 DIAGNOSIS — G40909 Epilepsy, unspecified, not intractable, without status epilepticus: Secondary | ICD-10-CM | POA: Diagnosis not present

## 2017-02-14 DIAGNOSIS — I2510 Atherosclerotic heart disease of native coronary artery without angina pectoris: Secondary | ICD-10-CM | POA: Diagnosis not present

## 2017-02-14 DIAGNOSIS — G40909 Epilepsy, unspecified, not intractable, without status epilepticus: Secondary | ICD-10-CM | POA: Diagnosis not present

## 2017-02-14 DIAGNOSIS — M6281 Muscle weakness (generalized): Secondary | ICD-10-CM | POA: Diagnosis not present

## 2017-02-14 DIAGNOSIS — G250 Essential tremor: Secondary | ICD-10-CM | POA: Diagnosis not present

## 2017-02-14 DIAGNOSIS — J449 Chronic obstructive pulmonary disease, unspecified: Secondary | ICD-10-CM | POA: Diagnosis not present

## 2017-02-14 DIAGNOSIS — I110 Hypertensive heart disease with heart failure: Secondary | ICD-10-CM | POA: Diagnosis not present

## 2017-02-14 DIAGNOSIS — I5020 Unspecified systolic (congestive) heart failure: Secondary | ICD-10-CM | POA: Diagnosis not present

## 2017-02-14 DIAGNOSIS — I480 Paroxysmal atrial fibrillation: Secondary | ICD-10-CM | POA: Diagnosis not present

## 2017-02-14 DIAGNOSIS — K8020 Calculus of gallbladder without cholecystitis without obstruction: Secondary | ICD-10-CM | POA: Diagnosis not present

## 2017-02-14 DIAGNOSIS — E119 Type 2 diabetes mellitus without complications: Secondary | ICD-10-CM | POA: Diagnosis not present

## 2017-02-16 DIAGNOSIS — G40909 Epilepsy, unspecified, not intractable, without status epilepticus: Secondary | ICD-10-CM | POA: Diagnosis not present

## 2017-02-16 DIAGNOSIS — E119 Type 2 diabetes mellitus without complications: Secondary | ICD-10-CM | POA: Diagnosis not present

## 2017-02-16 DIAGNOSIS — M6281 Muscle weakness (generalized): Secondary | ICD-10-CM | POA: Diagnosis not present

## 2017-02-16 DIAGNOSIS — I480 Paroxysmal atrial fibrillation: Secondary | ICD-10-CM | POA: Diagnosis not present

## 2017-02-16 DIAGNOSIS — K8020 Calculus of gallbladder without cholecystitis without obstruction: Secondary | ICD-10-CM | POA: Diagnosis not present

## 2017-02-16 DIAGNOSIS — G250 Essential tremor: Secondary | ICD-10-CM | POA: Diagnosis not present

## 2017-02-16 DIAGNOSIS — I110 Hypertensive heart disease with heart failure: Secondary | ICD-10-CM | POA: Diagnosis not present

## 2017-02-16 DIAGNOSIS — I2510 Atherosclerotic heart disease of native coronary artery without angina pectoris: Secondary | ICD-10-CM | POA: Diagnosis not present

## 2017-02-16 DIAGNOSIS — J449 Chronic obstructive pulmonary disease, unspecified: Secondary | ICD-10-CM | POA: Diagnosis not present

## 2017-02-16 DIAGNOSIS — I5020 Unspecified systolic (congestive) heart failure: Secondary | ICD-10-CM | POA: Diagnosis not present

## 2017-02-17 ENCOUNTER — Ambulatory Visit: Payer: Self-pay

## 2017-02-17 DIAGNOSIS — I110 Hypertensive heart disease with heart failure: Secondary | ICD-10-CM | POA: Diagnosis not present

## 2017-02-17 DIAGNOSIS — M6281 Muscle weakness (generalized): Secondary | ICD-10-CM | POA: Diagnosis not present

## 2017-02-17 DIAGNOSIS — G250 Essential tremor: Secondary | ICD-10-CM | POA: Diagnosis not present

## 2017-02-17 DIAGNOSIS — I5020 Unspecified systolic (congestive) heart failure: Secondary | ICD-10-CM | POA: Diagnosis not present

## 2017-02-17 DIAGNOSIS — G40909 Epilepsy, unspecified, not intractable, without status epilepticus: Secondary | ICD-10-CM | POA: Diagnosis not present

## 2017-02-17 DIAGNOSIS — J449 Chronic obstructive pulmonary disease, unspecified: Secondary | ICD-10-CM | POA: Diagnosis not present

## 2017-02-17 DIAGNOSIS — K8020 Calculus of gallbladder without cholecystitis without obstruction: Secondary | ICD-10-CM | POA: Diagnosis not present

## 2017-02-17 DIAGNOSIS — E119 Type 2 diabetes mellitus without complications: Secondary | ICD-10-CM | POA: Diagnosis not present

## 2017-02-17 DIAGNOSIS — I2510 Atherosclerotic heart disease of native coronary artery without angina pectoris: Secondary | ICD-10-CM | POA: Diagnosis not present

## 2017-02-17 DIAGNOSIS — I480 Paroxysmal atrial fibrillation: Secondary | ICD-10-CM | POA: Diagnosis not present

## 2017-02-20 ENCOUNTER — Ambulatory Visit (INDEPENDENT_AMBULATORY_CARE_PROVIDER_SITE_OTHER): Payer: Medicare HMO

## 2017-02-20 VITALS — BP 106/76 | HR 76 | Temp 97.9°F | Resp 15 | Ht 71.0 in | Wt 231.8 lb

## 2017-02-20 DIAGNOSIS — I110 Hypertensive heart disease with heart failure: Secondary | ICD-10-CM | POA: Diagnosis not present

## 2017-02-20 DIAGNOSIS — G250 Essential tremor: Secondary | ICD-10-CM | POA: Diagnosis not present

## 2017-02-20 DIAGNOSIS — I2510 Atherosclerotic heart disease of native coronary artery without angina pectoris: Secondary | ICD-10-CM | POA: Diagnosis not present

## 2017-02-20 DIAGNOSIS — E119 Type 2 diabetes mellitus without complications: Secondary | ICD-10-CM | POA: Diagnosis not present

## 2017-02-20 DIAGNOSIS — I480 Paroxysmal atrial fibrillation: Secondary | ICD-10-CM | POA: Diagnosis not present

## 2017-02-20 DIAGNOSIS — I5020 Unspecified systolic (congestive) heart failure: Secondary | ICD-10-CM | POA: Diagnosis not present

## 2017-02-20 DIAGNOSIS — K8020 Calculus of gallbladder without cholecystitis without obstruction: Secondary | ICD-10-CM | POA: Diagnosis not present

## 2017-02-20 DIAGNOSIS — J449 Chronic obstructive pulmonary disease, unspecified: Secondary | ICD-10-CM | POA: Diagnosis not present

## 2017-02-20 DIAGNOSIS — Z0000 Encounter for general adult medical examination without abnormal findings: Secondary | ICD-10-CM | POA: Diagnosis not present

## 2017-02-20 DIAGNOSIS — M6281 Muscle weakness (generalized): Secondary | ICD-10-CM | POA: Diagnosis not present

## 2017-02-20 DIAGNOSIS — G40909 Epilepsy, unspecified, not intractable, without status epilepticus: Secondary | ICD-10-CM | POA: Diagnosis not present

## 2017-02-20 NOTE — Patient Instructions (Addendum)
  Chase Simmons , Thank you for taking time to come for your Medicare Wellness Visit. I appreciate your ongoing commitment to your health goals. Please review the following plan we discussed and let me know if I can assist you in the future.   Follow up with Dr. Caryl Bis as needed.    Schedule a diabetic eye exam.  Have a great day!  These are the goals we discussed: Goals    . DIET - INCREASE WATER INTAKE       This is a list of the screening recommended for you and due dates:  Health Maintenance  Topic Date Due  . Tetanus Vaccine  02/01/1954  . Eye exam for diabetics  07/08/2015  . Complete foot exam   03/15/2017  . Hemoglobin A1C  05/08/2017  . Flu Shot  Completed

## 2017-02-20 NOTE — Progress Notes (Signed)
Subjective:   Chase Simmons is a 84 y.o. male who presents for Medicare Annual/Subsequent preventive examination.  Review of Systems:  No ROS.  Medicare Wellness Visit. Additional risk factors are reflected in the social history. Cardiac Risk Factors include: advanced age (>64mn, >>33women);hypertension;male gender;diabetes mellitus     Objective:    Vitals: BP 106/76 (BP Location: Left Arm, Patient Position: Sitting, Cuff Size: Normal)   Pulse 76   Temp 97.9 F (36.6 C) (Oral)   Resp 15   Ht '5\' 11"'  (1.803 m)   Wt 231 lb 12.8 oz (105.1 kg)   SpO2 99%   BMI 32.33 kg/m   Body mass index is 32.33 kg/m.  Advanced Directives 02/20/2017 01/07/2017 12/31/2016 09/06/2016 08/25/2016 08/24/2016 08/23/2016  Does Patient Have a Medical Advance Directive? No No No No No No No  Type of Advance Directive - - - - - - -  Does patient want to make changes to medical advance directive? - No - Patient declined - - - - -  Would patient like information on creating a medical advance directive? No - Patient declined No - Patient declined Yes (ED - Information included in AVS) No - Patient declined No - Patient declined - -    Tobacco Social History   Tobacco Use  Smoking Status Former Smoker  . Packs/day: 1.50  . Years: 40.00  . Pack years: 60.00  Smokeless Tobacco Never Used     Counseling given: Not Answered   Clinical Intake:  Pre-visit preparation completed: Yes  Pain : No/denies pain     Nutritional Status: BMI > 30  Obese Diabetes: No  How often do you need to have someone help you when you read instructions, pamphlets, or other written materials from your doctor or pharmacy?: 1 - Never  Interpreter Needed?: No     Past Medical History:  Diagnosis Date  . Allergy   . Bladder cancer ([facility]    a. followed by Dr. CJacqlyn Larsen . Chronic combined systolic (congestive) and diastolic (congestive) heart failure ([facility]    a. 07/2016 Echo: EF 30-35%, sev mid-apicalanteroseptal, ant, inf  HK, Gr1 DD.  .Marland KitchenCOPD (chronic obstructive pulmonary disease) ([facility]   . Hyperlipidemia   . Hypertension   . NICM (nonischemic cardiomyopathy) ([facility]    a. 07/2016 Echo: EF 30-35%, sev mid-apicalanteroseptal, ant, inf HK, Gr1 DD, mild MR, mildly dil LA, PASP 439mg - ? stress induced CM.  . Marland Kitchenonobstructive Coronary atherosclerosis    a. 07/2016 Cath: LM nl, LAD nl, D1 40ost, LCX 2014mCA nl, EF 25-30%.  . PMarland KitchenF (paroxysmal atrial fibrillation) ([facility]  a. Dx 08/2013. CHA2DS2VASc = 6-->no OAC 2/2 h/o falls and nocompliance.  . Tremor, essential    sees [facility] neurologist  . Type 2 diabetes mellitus with complication, without long-term current use of insulin ([facility]/30/2014  . Urinary incontinence    Past Surgical History:  Procedure Laterality Date  . bladder cancer    . CATARACT EXTRACTION    . HERNIA REPAIR    . LEFT HEART CATH AND CORONARY ANGIOGRAPHY N/A 07/11/2016   Procedure: Left Heart Cath and Coronary Angiography;  Surgeon: AriWellington HampshireD;  [facility];  Service: Cardiovascular;  Laterality: N/A;   Family History  Problem Relation Age of Onset  . Arthritis Mother   . Dementia Mother   . Cancer Maternal Uncle        prostate  . Cancer Maternal Aunt  breast cancer   Social History   Socioeconomic History  . Marital status: Legally Separated    Spouse name: None  . Number of children: 3  . Years of education: 42  . Highest education level: None  Social Needs  . Financial resource strain: Not hard at all  . Food insecurity - worry: Never true  . Food insecurity - inability: Never true  . Transportation needs - medical: No  . Transportation needs - non-medical: No  Occupational History  . Occupation: Retired Administrator  Tobacco Use  . Smoking status: Former Smoker    Packs/day: 1.50    Years: 40.00    Pack years: 60.00  . Smokeless tobacco: Never Used  Substance and Sexual Activity  . Alcohol use: Yes    Alcohol/week: 7.2 oz    Types: 12  Standard drinks or equivalent per week    Frequency: Never    Comment: 1 drink with dinner  . Drug use: No  . Sexual activity: No  Other Topics Concern  . None  Social History Narrative   Patient grew up in [place], [place]. He is separated from his wife since 40. They never divorced. Patient has 1 son and 2 daughters. 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    Outpatient Encounter Medications as of 02/20/2017  Medication Sig  . acetaminophen (TYLENOL) 325 MG tablet Take 2 tablets (650 mg total) by mouth every 6 (six) hours as needed for mild pain (or Fever >/= 101).  Marland Kitchen ALPRAZolam (XANAX) 0.25 MG tablet take 1 tablet by mouth every 12 hours if needed for anxiety  . aspirin EC 81 MG tablet Take 1 tablet (81 mg total) by mouth daily.  Marland Kitchen atorvastatin (LIPITOR) 80 MG tablet TAKE 1 TABLET ONE TIME DAILY  . blood glucose meter kit and supplies KIT Dispense based on patient and insurance preference. Use up to four times daily as directed.  . fexofenadine (ALLEGRA) 180 MG tablet Take 180 mg by mouth daily as needed for allergies or rhinitis.  Marland Kitchen glimepiride (AMARYL) 4 MG tablet take 1 tablet by mouth WITH BREAKFAST.  Marland Kitchen lisinopril (PRINIVIL,ZESTRIL) 20 MG tablet Take 1 tablet (20 mg total) by mouth daily.  . meclizine (ANTIVERT) 12.5 MG tablet Take 1 tablet (12.5 mg total) by mouth 3 (three) times daily as needed for dizziness.  . metFORMIN (GLUCOPHAGE) 1000 MG tablet Take 1 tablet (1,000 mg total) 2 (two) times daily with a meal by mouth.  . pantoprazole (PROTONIX) 40 MG tablet Take 1 tablet (40 mg total) by mouth daily.  Marland Kitchen PARoxetine (PAXIL) 40 MG tablet Take 1 tablet (40 mg total) by mouth daily.  . primidone (MYSOLINE) 50 MG tablet Take 1.5 tablets (75 mg total) by mouth 2 (two) times daily.  Marland Kitchen tiotropium (SPIRIVA) 18 MCG inhalation capsule Place 18 mcg as needed into inhaler and inhale.    No facility-administered encounter medications on file as of 02/20/2017.     Activities of  Daily Living In your present state of health, do you have any difficulty performing the following activities: 02/20/2017 08/25/2016  Hearing? Y Y  Comment - cannot hear with the right ear" pt stated  Vision? Y N  Difficulty concentrating or making decisions? N N  Walking or climbing stairs? Y Y  Comment Unsteady gait.  Knee pain, bllateral pain. -  Dressing or bathing? Tempie Donning  Comment Home health care assists -  Doing errands, shopping? Y N  Comment He does not  drive -  Preparing Food and eating ? Y -  Comment daughter assists with meal prep -  Using the Toilet? N -  In the past six months, have you accidently leaked urine? N -  Comment Followed by urologist -  Do you have problems with loss of bowel control? N -  Managing your Medications? Y -  Comment daughter assists -  Managing your Finances? Y -  Comment daughter assists -  Housekeeping or managing your Housekeeping? Y -  Comment daughter assists -  Some recent data might be hidden    Patient Care Team: Leone Haven, MD as PCP - General (Family Medicine)   Assessment:   This is a routine wellness examination for [place].  The goal of the wellness visit is to assist the patient how to close the gaps in care and create a preventative care plan for the patient.  The roster of all physicians providing medical care to patient is listed in the Snapshot section of the chart.  Osteoporosis risk reviewed.    Safety issues reviewed; Smoke and carbon monoxide detectors in the home. No firearms or firearms locked in a safe within the home. Wears seatbelts when riding with others. No violence in the home.  They do not have excessive sun exposure.  Discussed the need for sun protection: hats, long sleeves and the use of sunscreen if there is significant sun exposure.  Patient is alert, normal appearance, oriented to person/place/and time.  Correctly identified the president of the [place] and recalls of 3/3 words. Performs simple  calculations and can read correct time from watch face.  Displays appropriate judgement.  No new identified risk were noted.  No failures at ADL's or IADL's.  Ambulates with wheelchair.   BMI- discussed the importance of a healthy diet, water intake and the benefits of aerobic exercise. Educational material provided.   24 hour diet recall: Low carb diet.  Daily water intake: water 2 cups of water.  I encouraged increased water intake.   Dental- every 12 months.  Eye- Visual acuity not assessed per patient preference. L cataract removed.  Encouraged to make a diabetic eye exam.  Sleep patterns- Sleeps 6-8 hours at night.   TDAP vaccine deferred per patient preference.  Follow up with insurance.  Educational material provided.  Patient Concerns: None at this time. Follow up with PCP as needed.  Exercise Activities and Dietary recommendations Current Exercise Habits: Home exercise routine, Type of exercise: walking(Physical therapy.  Standing exercises. Squats. ), Time (Minutes): 45, Frequency (Times/Week): 2, Weekly Exercise (Minutes/Week): 90, Intensity: Mild  Goals    . DIET - INCREASE WATER INTAKE       Fall Risk Fall Risk  02/20/2017 02/17/2016 01/08/2015  Falls in the past year? Yes Yes No  Number falls in past yr: 1 1 -  Injury with Fall? No No -  Risk for fall due to : Impaired balance/gait;History of fall(s) Impaired balance/gait -  Follow up Falls evaluation completed;Education provided Falls prevention discussed -   Depression Screen PHQ 2/9 Scores 02/20/2017 02/17/2016 01/08/2015  PHQ - 2 Score 0 0 0    Cognitive Function     6CIT Screen 02/20/2017 02/17/2016  What Year? 0 points 0 points  What month? 0 points 0 points  What time? 0 points 0 points  Count back from 20 0 points 0 points  Months in reverse 0 points 0 points  Repeat phrase 0 points -  Total Score 0 -  Immunization History  Administered Date(s) Administered  . Influenza, High Dose Seasonal PF  10/05/2015, 11/08/2016  . Influenza,inj,Quad PF,6+ Mos 01/08/2015  . Pneumococcal Polysaccharide-23 01/03/2010   Screening Tests Health Maintenance  Topic Date Due  . TETANUS/TDAP  02[birth year]01/1952  . OPHTHALMOLOGY EXAM  07/08/2015  . FOOT EXAM  03/15/2017  . HEMOGLOBIN A1C  05/08/2017  . INFLUENZA VACCINE  Completed      Plan:   End of life planning; Advanced aging; Advanced directives discussed.  No HCPOA/Living Will.  Additional information declined at this time.  I have personally reviewed and noted the following in the patient's chart:   . Medical and social history . Use of alcohol, tobacco or illicit drugs  . Current medications and supplements . Functional ability and status . Nutritional status . Physical activity . Advanced directives . List of other physicians . Hospitalizations, surgeries, and ER visits in previous 12 months . Vitals . Screenings to include cognitive, depression, and falls . Referrals and appointments  In addition, I have reviewed and discussed with patient certain preventive protocols, quality metrics, and best practice recommendations. A written personalized care plan for preventive services as well as general preventive health recommendations were provided to patient.     Varney Biles, LPN  [id number]/[id number]

## 2017-02-22 DIAGNOSIS — K8020 Calculus of gallbladder without cholecystitis without obstruction: Secondary | ICD-10-CM | POA: Diagnosis not present

## 2017-02-22 DIAGNOSIS — M6281 Muscle weakness (generalized): Secondary | ICD-10-CM | POA: Diagnosis not present

## 2017-02-22 DIAGNOSIS — G250 Essential tremor: Secondary | ICD-10-CM | POA: Diagnosis not present

## 2017-02-22 DIAGNOSIS — I5020 Unspecified systolic (congestive) heart failure: Secondary | ICD-10-CM | POA: Diagnosis not present

## 2017-02-22 DIAGNOSIS — E119 Type 2 diabetes mellitus without complications: Secondary | ICD-10-CM | POA: Diagnosis not present

## 2017-02-22 DIAGNOSIS — I480 Paroxysmal atrial fibrillation: Secondary | ICD-10-CM | POA: Diagnosis not present

## 2017-02-22 DIAGNOSIS — J449 Chronic obstructive pulmonary disease, unspecified: Secondary | ICD-10-CM | POA: Diagnosis not present

## 2017-02-22 DIAGNOSIS — I110 Hypertensive heart disease with heart failure: Secondary | ICD-10-CM | POA: Diagnosis not present

## 2017-02-22 DIAGNOSIS — G40909 Epilepsy, unspecified, not intractable, without status epilepticus: Secondary | ICD-10-CM | POA: Diagnosis not present

## 2017-02-22 DIAGNOSIS — I2510 Atherosclerotic heart disease of native coronary artery without angina pectoris: Secondary | ICD-10-CM | POA: Diagnosis not present

## 2017-02-24 DIAGNOSIS — E119 Type 2 diabetes mellitus without complications: Secondary | ICD-10-CM | POA: Diagnosis not present

## 2017-02-24 DIAGNOSIS — G250 Essential tremor: Secondary | ICD-10-CM | POA: Diagnosis not present

## 2017-02-24 DIAGNOSIS — J449 Chronic obstructive pulmonary disease, unspecified: Secondary | ICD-10-CM | POA: Diagnosis not present

## 2017-02-24 DIAGNOSIS — I110 Hypertensive heart disease with heart failure: Secondary | ICD-10-CM | POA: Diagnosis not present

## 2017-02-24 DIAGNOSIS — K8020 Calculus of gallbladder without cholecystitis without obstruction: Secondary | ICD-10-CM | POA: Diagnosis not present

## 2017-02-24 DIAGNOSIS — I5020 Unspecified systolic (congestive) heart failure: Secondary | ICD-10-CM | POA: Diagnosis not present

## 2017-02-24 DIAGNOSIS — I2510 Atherosclerotic heart disease of native coronary artery without angina pectoris: Secondary | ICD-10-CM | POA: Diagnosis not present

## 2017-02-24 DIAGNOSIS — G40909 Epilepsy, unspecified, not intractable, without status epilepticus: Secondary | ICD-10-CM | POA: Diagnosis not present

## 2017-02-24 DIAGNOSIS — I480 Paroxysmal atrial fibrillation: Secondary | ICD-10-CM | POA: Diagnosis not present

## 2017-02-24 DIAGNOSIS — M6281 Muscle weakness (generalized): Secondary | ICD-10-CM | POA: Diagnosis not present

## 2017-02-27 ENCOUNTER — Other Ambulatory Visit: Payer: Self-pay

## 2017-02-27 NOTE — Telephone Encounter (Signed)
Last OV 11/08/16 last filled by DR.Cook 11/04/15 90 3rf Called patient to ask if he is still taking this, he was unsure and will have his daughter call back.ok for pec to speak to patient or daughter to ask if he is still taking atorvastatin

## 2017-03-01 DIAGNOSIS — G40909 Epilepsy, unspecified, not intractable, without status epilepticus: Secondary | ICD-10-CM | POA: Diagnosis not present

## 2017-03-01 DIAGNOSIS — E119 Type 2 diabetes mellitus without complications: Secondary | ICD-10-CM | POA: Diagnosis not present

## 2017-03-01 DIAGNOSIS — I2510 Atherosclerotic heart disease of native coronary artery without angina pectoris: Secondary | ICD-10-CM | POA: Diagnosis not present

## 2017-03-01 DIAGNOSIS — I5020 Unspecified systolic (congestive) heart failure: Secondary | ICD-10-CM | POA: Diagnosis not present

## 2017-03-01 DIAGNOSIS — I480 Paroxysmal atrial fibrillation: Secondary | ICD-10-CM | POA: Diagnosis not present

## 2017-03-01 DIAGNOSIS — J449 Chronic obstructive pulmonary disease, unspecified: Secondary | ICD-10-CM | POA: Diagnosis not present

## 2017-03-01 DIAGNOSIS — K8020 Calculus of gallbladder without cholecystitis without obstruction: Secondary | ICD-10-CM | POA: Diagnosis not present

## 2017-03-01 DIAGNOSIS — G250 Essential tremor: Secondary | ICD-10-CM | POA: Diagnosis not present

## 2017-03-01 DIAGNOSIS — I110 Hypertensive heart disease with heart failure: Secondary | ICD-10-CM | POA: Diagnosis not present

## 2017-03-01 DIAGNOSIS — M6281 Muscle weakness (generalized): Secondary | ICD-10-CM | POA: Diagnosis not present

## 2017-03-17 ENCOUNTER — Other Ambulatory Visit: Payer: Self-pay

## 2017-03-17 MED ORDER — PRIMIDONE 50 MG PO TABS: 75.0000 mg | ORAL_TABLET | Freq: Two times a day (BID) | ORAL | 1 refills | Status: DC

## 2017-03-17 NOTE — Telephone Encounter (Signed)
Last OV 11/08/16 last filled 09/15/16 270 1rf by DrCook

## 2017-04-02 ENCOUNTER — Other Ambulatory Visit: Payer: Self-pay | Admitting: Family Medicine

## 2017-04-05 NOTE — Telephone Encounter (Signed)
Last OV 11/08/16 last filled Alprazolam 01/20/17 20 1rf Meclizine 01/20/17 30 0rf

## 2017-04-05 NOTE — Telephone Encounter (Signed)
Patient needs a follow-up visit scheduled.  I sent refills into his pharmacy though if he has continued to have dizziness he needs to be reevaluated.  I will not be able to continue to prescribe the Xanax if he does not follow-up.  Thanks.

## 2017-04-11 NOTE — Telephone Encounter (Signed)
Tried calling, no voicemail ok for pec to speak to patient about message below and schedule follow up  Patient needs a follow-up visit scheduled.  I sent refills into his pharmacy though if he has continued to have dizziness he needs to be reevaluated.  I will not be able to continue to prescribe the Xanax if he does not follow-up.  Thanks.

## 2017-04-14 ENCOUNTER — Encounter: Payer: Self-pay | Admitting: *Deleted

## 2017-04-14 NOTE — Telephone Encounter (Signed)
Mailed letter °

## 2017-05-12 DIAGNOSIS — Z7984 Long term (current) use of oral hypoglycemic drugs: Secondary | ICD-10-CM | POA: Diagnosis not present

## 2017-05-12 DIAGNOSIS — I4891 Unspecified atrial fibrillation: Secondary | ICD-10-CM | POA: Diagnosis not present

## 2017-05-12 DIAGNOSIS — G3184 Mild cognitive impairment, so stated: Secondary | ICD-10-CM | POA: Diagnosis not present

## 2017-05-12 DIAGNOSIS — R251 Tremor, unspecified: Secondary | ICD-10-CM | POA: Diagnosis not present

## 2017-05-12 DIAGNOSIS — R269 Unspecified abnormalities of gait and mobility: Secondary | ICD-10-CM | POA: Diagnosis not present

## 2017-05-12 DIAGNOSIS — I10 Essential (primary) hypertension: Secondary | ICD-10-CM | POA: Diagnosis not present

## 2017-05-12 DIAGNOSIS — E1165 Type 2 diabetes mellitus with hyperglycemia: Secondary | ICD-10-CM | POA: Diagnosis not present

## 2017-05-12 DIAGNOSIS — R69 Illness, unspecified: Secondary | ICD-10-CM | POA: Diagnosis not present

## 2017-05-12 DIAGNOSIS — J449 Chronic obstructive pulmonary disease, unspecified: Secondary | ICD-10-CM | POA: Diagnosis not present

## 2017-05-12 DIAGNOSIS — Z8546 Personal history of malignant neoplasm of prostate: Secondary | ICD-10-CM | POA: Diagnosis not present

## 2017-06-02 ENCOUNTER — Other Ambulatory Visit: Payer: Self-pay | Admitting: Family Medicine

## 2017-06-02 NOTE — Telephone Encounter (Signed)
Refill sent to pharmacy for 30 tablets.  Please contact him on Monday to get him set up for an appointment.  I will not continue to refill this if he does not have an appointment set up.  Thanks.

## 2017-06-06 NOTE — Telephone Encounter (Signed)
Patient is scheduled   

## 2017-06-14 ENCOUNTER — Other Ambulatory Visit: Payer: Self-pay | Admitting: *Deleted

## 2017-06-14 NOTE — Patient Outreach (Signed)
[facility]) Care Management  06/14/2017  Chase Simmons 23-Jan-1933 [id number]  Referral Date: [id number] Referral Source: ED High Utilize  Referral Reason: From ED High Utilization Insurance: [place] [employer]  Attempted screening  outreach call to patient.  Patient was unavailable. No voice mail pickup. Plan: RN will call patient again within 10 business days.   [facility] Care Management [phone]

## 2017-06-15 ENCOUNTER — Encounter: Payer: Self-pay | Admitting: Family Medicine

## 2017-06-15 ENCOUNTER — Ambulatory Visit (INDEPENDENT_AMBULATORY_CARE_PROVIDER_SITE_OTHER): Payer: Medicare HMO | Admitting: Family Medicine

## 2017-06-15 VITALS — BP 110/60 | HR 53 | Temp 98.0°F | Ht 71.0 in | Wt 228.4 lb

## 2017-06-15 DIAGNOSIS — G8929 Other chronic pain: Secondary | ICD-10-CM | POA: Diagnosis not present

## 2017-06-15 DIAGNOSIS — F419 Anxiety disorder, unspecified: Secondary | ICD-10-CM | POA: Diagnosis not present

## 2017-06-15 DIAGNOSIS — R69 Illness, unspecified: Secondary | ICD-10-CM | POA: Diagnosis not present

## 2017-06-15 DIAGNOSIS — E118 Type 2 diabetes mellitus with unspecified complications: Secondary | ICD-10-CM | POA: Diagnosis not present

## 2017-06-15 DIAGNOSIS — D7589 Other specified diseases of blood and blood-forming organs: Secondary | ICD-10-CM

## 2017-06-15 DIAGNOSIS — M25561 Pain in right knee: Secondary | ICD-10-CM

## 2017-06-15 DIAGNOSIS — R42 Dizziness and giddiness: Secondary | ICD-10-CM | POA: Diagnosis not present

## 2017-06-15 DIAGNOSIS — M25562 Pain in left knee: Secondary | ICD-10-CM | POA: Diagnosis not present

## 2017-06-15 DIAGNOSIS — C679 Malignant neoplasm of bladder, unspecified: Secondary | ICD-10-CM | POA: Diagnosis not present

## 2017-06-15 LAB — CBC
HEMATOCRIT: 37.9 % — AB (ref 39.0–52.0)
HEMOGLOBIN: 12.6 g/dL — AB (ref 13.0–17.0)
MCHC: 33.3 g/dL (ref 30.0–36.0)
MCV: 104.7 fl — AB (ref 78.0–100.0)
Platelets: 166 10*3/uL (ref 150.0–400.0)
RBC: 3.62 Mil/uL — ABNORMAL LOW (ref 4.22–5.81)
RDW: 13.3 % (ref 11.5–15.5)
WBC: 6.2 10*3/uL (ref 4.0–10.5)

## 2017-06-15 LAB — B12 AND FOLATE PANEL
Folate: 18.2 ng/mL (ref >5.9)
Vitamin B-12: 632 pg/mL (ref 211–911)

## 2017-06-15 LAB — COMPREHENSIVE METABOLIC PANEL
ALT: 20 U/L (ref 0–53)
AST: 18 U/L (ref 0–37)
Albumin: 3.8 g/dL (ref 3.5–5.2)
Alkaline Phosphatase: 85 U/L (ref 39–117)
BUN: 40 mg/dL — ABNORMAL HIGH (ref 6–23)
CHLORIDE: 104 meq/L (ref 96–112)
CO2: 27 meq/L (ref 19–32)
Calcium: 9.6 mg/dL (ref 8.4–10.5)
Creatinine, Ser: 1.26 mg/dL (ref 0.40–1.50)
GFR: 58.18 mL/min — AB (ref >60.00)
GLUCOSE: 123 mg/dL — AB (ref 70–99)
Potassium: 4.4 mEq/L (ref 3.5–5.1)
Sodium: 139 mEq/L (ref 135–145)
Total Bilirubin: 0.4 mg/dL (ref 0.2–1.2)
Total Protein: 6.8 g/dL (ref 6.0–8.3)

## 2017-06-15 LAB — LIPID PANEL
CHOL/HDL RATIO: 5
Cholesterol: 204 mg/dL — ABNORMAL HIGH (ref 0–200)
HDL: 45 mg/dL (ref >39.00)
LDL CALC: 137 mg/dL — AB (ref 0–99)
NONHDL: 159.1
Triglycerides: 112 mg/dL (ref 0.0–149.0)
VLDL: 22.4 mg/dL (ref 0.0–40.0)

## 2017-06-15 LAB — HEMOGLOBIN A1C: Hgb A1c MFr Bld: 6.6 % — ABNORMAL HIGH (ref 4.6–6.5)

## 2017-06-15 MED ORDER — ALPRAZOLAM 0.25 MG PO TABS: ORAL_TABLET | ORAL | 1 refills | Status: DC

## 2017-06-15 MED ORDER — PAROXETINE HCL 40 MG PO TABS: 40.0000 mg | ORAL_TABLET | Freq: Every day | ORAL | 1 refills | Status: DC

## 2017-06-15 MED ORDER — LISINOPRIL 20 MG PO TABS: 20.0000 mg | ORAL_TABLET | Freq: Every day | ORAL | 1 refills | Status: DC

## 2017-06-15 MED ORDER — GLIMEPIRIDE 4 MG PO TABS: ORAL_TABLET | ORAL | 1 refills | Status: DC

## 2017-06-15 MED ORDER — METFORMIN HCL 1000 MG PO TABS: 1000.0000 mg | ORAL_TABLET | Freq: Two times a day (BID) | ORAL | 1 refills | Status: DC

## 2017-06-15 MED ORDER — PANTOPRAZOLE SODIUM 40 MG PO TBEC: 40.0000 mg | DELAYED_RELEASE_TABLET | Freq: Every day | ORAL | 1 refills | Status: DC

## 2017-06-15 MED ORDER — ATORVASTATIN CALCIUM 80 MG PO TABS: ORAL_TABLET | ORAL | 3 refills | Status: DC

## 2017-06-15 NOTE — Assessment & Plan Note (Signed)
Stable.  Occasional Xanax use.  Drug database reviewed.  Xanax refilled.  He will monitor for drowsiness with this.  Continue Paxil as well.

## 2017-06-15 NOTE — Assessment & Plan Note (Signed)
Check A1c with labs.  We will continue his current regimen.

## 2017-06-15 NOTE — Patient Instructions (Signed)
Nice to see you. We will check lab work today and contact you with the results. We will have you see orthopedics for your knees. We will have you see an eye doctor for your yearly vision exam. If your vertigo returns please be evaluated.

## 2017-06-15 NOTE — Assessment & Plan Note (Signed)
Has not recurred in some time.  He will monitor for recurrence.  His family will continue to support him at home.

## 2017-06-15 NOTE — Assessment & Plan Note (Signed)
Chronic issue.  We will get him back in to see orthopedics.

## 2017-06-15 NOTE — Progress Notes (Signed)
Chase Rumps, MD Phone: [phone]  Chase Simmons is a 84 y.o. male who presents today for f/u.  CC: dm, vertigo, knee pain  DIABETES Disease Monitoring: Blood Sugar ranges-not checking Polyuria/phagia/dipsia- no      Optho- due Medications: Compliance- taking glimeperide, metformin Hypoglycemic symptoms- no  Patient has bilateral knee pain that is chronic.  He has had steroid injections in the past.  This has been going on at least 2 years.  Occasionally feels like bone-on-bone.  He possibly used a topical medication.  He also used some oral medication he got over-the-counter that was beneficial.  He does not remember the name.  Anxiety: He takes Paxil and Xanax.  No drowsiness with this.  He only occasionally takes it.  No alcohol use.  No depression.  He was seen in the emergency department for vertigo on the number of occasions around the new year.  They attempted to get him into SNF though he did not meet insurance criteria.  He is living at home alone.  He has not had any recurrence of vertigo.  Home health and physical therapy released him.  He did have a CT head with no acute changes.   Social History   Tobacco Use  Smoking Status Former Smoker  . Packs/day: 1.50  . Years: 40.00  . Pack years: 60.00  Smokeless Tobacco Never Used     ROS see history of present illness  Objective  Physical Exam Vitals:   06/15/17 1000  BP: 110/60  Pulse: (!) 53  Temp: 98 F (36.7 C)  SpO2: 96%    BP Readings from Last 3 Encounters:  06/15/17 110/60  02/20/17 106/76  01/08/17 (!) 141/74   Wt Readings from Last 3 Encounters:  06/15/17 228 lb 6.4 oz (103.6 kg)  02/20/17 231 lb 12.8 oz (105.1 kg)  01/07/17 240 lb (108.9 kg)    Physical Exam  Constitutional: No distress.  Cardiovascular: Normal rate, regular rhythm and normal heart sounds.  Pulmonary/Chest: Effort normal and breath sounds normal.  Musculoskeletal: He exhibits no edema.  Bilateral knees with  slight tenderness over the patellar tendon, otherwise no tenderness, swelling, warmth, erythema, no ACL or PCL laxity, negative McMurray's  Neurological: He is alert.  Skin: Skin is warm and dry. He is not diaphoretic.   Diabetic Foot Exam - Simple   Simple Foot Form Diabetic Foot exam was performed with the following findings:  Yes 06/15/2017 10:33 AM  Visual Inspection See comments:  Yes Sensation Testing See comments:  Yes Pulse Check Posterior Tibialis and Dorsalis pulse intact bilaterally:  Yes Comments Thickened toenails bilaterally, no other deformities, ulcerations, or skin breakdown, decreased monofilament testing on the plantar surface of his foot, intact light touch      Assessment/Plan: Please see individual problem list.  Type 2 diabetes mellitus with complication, without long-term current use of insulin (HCC) Check A1c with labs.  We will continue his current regimen.  Malignant neoplasm of urinary bladder [facility]) Patient's urologist is leaving the area.  He needs referral to a new urologist.  This will be placed.  Dizziness Has not recurred in some time.  He will monitor for recurrence.  His family will continue to support him at home.  Knee pain, bilateral Chronic issue.  We will get him back in to see orthopedics.  Anxiety Stable.  Occasional Xanax use.  Drug database reviewed.  Xanax refilled.  He will monitor for drowsiness with this.  Continue Paxil as well.   Health Maintenance:  Refer for ophthalmology visit.  Orders Placed This Encounter  Procedures  . Comp Met (CMET)  . Lipid panel  . B12 and Folate Panel  . CBC  . HgB A1c  . Ambulatory referral to Orthopedic Surgery    Referral Priority:   Routine    Referral Type:   Surgical    Referral Reason:   Specialty Services Required    Requested Specialty:   Orthopedic Surgery    Number of Visits Requested:   1  . Ambulatory referral to Ophthalmology    Referral Priority:   Routine    Referral Type:    Consultation    Referral Reason:   Specialty Services Required    Requested Specialty:   Ophthalmology    Number of Visits Requested:   1  . Ambulatory referral to Urology    Referral Priority:   Routine    Referral Type:   Consultation    Referral Reason:   Specialty Services Required    Requested Specialty:   Urology    Number of Visits Requested:   1    Meds ordered this encounter  Medications  . ALPRAZolam (XANAX) 0.25 MG tablet    Sig: TAKE 1 TABLET BY MOUTH EVERY 12 HOURS AS NEEDED FOR ANXIETY    Dispense:  20 tablet    Refill:  1    Not to exceed 4 additional fills before 07/19/2017  . atorvastatin (LIPITOR) 80 MG tablet    Sig: TAKE 1 TABLET ONE TIME DAILY    Dispense:  90 tablet    Refill:  3  . glimepiride (AMARYL) 4 MG tablet    Sig: take 1 tablet by mouth WITH BREAKFAST.    Dispense:  90 tablet    Refill:  1  . lisinopril (PRINIVIL,ZESTRIL) 20 MG tablet    Sig: Take 1 tablet (20 mg total) by mouth daily.    Dispense:  90 tablet    Refill:  1  . metFORMIN (GLUCOPHAGE) 1000 MG tablet    Sig: Take 1 tablet (1,000 mg total) by mouth 2 (two) times daily with a meal.    Dispense:  360 tablet    Refill:  1  . pantoprazole (PROTONIX) 40 MG tablet    Sig: Take 1 tablet (40 mg total) by mouth daily.    Dispense:  90 tablet    Refill:  1  . PARoxetine (PAXIL) 40 MG tablet    Sig: Take 1 tablet (40 mg total) by mouth daily.    Dispense:  90 tablet    Refill:  1     Chase Rumps, MD [place]

## 2017-06-15 NOTE — Assessment & Plan Note (Signed)
Patient's urologist is leaving the area.  He needs referral to a new urologist.  This will be placed.

## 2017-06-16 ENCOUNTER — Ambulatory Visit: Payer: Self-pay | Admitting: *Deleted

## 2017-06-27 ENCOUNTER — Telehealth: Payer: Self-pay | Admitting: Family Medicine

## 2017-06-27 ENCOUNTER — Other Ambulatory Visit: Payer: Self-pay | Admitting: *Deleted

## 2017-06-27 ENCOUNTER — Encounter: Payer: Self-pay | Admitting: *Deleted

## 2017-06-27 ENCOUNTER — Ambulatory Visit: Payer: Self-pay | Admitting: *Deleted

## 2017-06-27 NOTE — Telephone Encounter (Signed)
Pt given lab results per notes of Dr Caryl Bis on 06/15/17. Pt verbalized understanding. Pt is going to check with daughter to make sure that she is giving him his Lipitor.  Pt stated he was not aware of being dx with CHF.   Notes recorded by Leone Haven, MD on 06/15/2017 at 4:09 PM EDT Please let the patient know it looks like he is slightly dehydrated. He should try to maintain adequate hydration though not overdo it given his history of congestive heart failure. His cholesterol is worse than prior and not well controlled. Please see if he has consistently been taking his Lipitor. He is again slightly anemic. His red blood cells appear to be slightly larger than they should be on his lab work. I would like to refer him to hematology for evaluation of that. If he is willing I can place the referral. His A1c is well controlled at 6.6. Thanks.

## 2017-06-27 NOTE — Telephone Encounter (Signed)
See result note.  

## 2017-06-27 NOTE — Patient Outreach (Addendum)
[facility]) Care Management  06/27/2017  Chase Simmons 1933/12/15 [id number]   RN Health Coach  Attempted #2screening  outreach call to patient.  Patient was unavailable. No voice mail pickup.  Plan: RN will outreach again in 3-5 days  Unsuccessful outreach letter sent [place] Management [phone]

## 2017-06-27 NOTE — Telephone Encounter (Signed)
FYI

## 2017-06-30 ENCOUNTER — Other Ambulatory Visit: Payer: Self-pay | Admitting: *Deleted

## 2017-06-30 NOTE — Patient Outreach (Signed)
[facility]) Care Management  06/30/2017   Chase Simmons 1933-04-18 [id number] RN Health Coach telephone call to patient.  Hipaa compliance verified. Per patient he is doing fair. Patient is in a wheelchair and stated it  takes a while to get to the phone. Patient is a diabetic and checks his blood sugars every once and a while. Patient is on oral agents. Per patient he is having some pain and tenderness in his knees. Patient stated he has had a fall within the past year while getting out of the shower. He is not having any problem with his COPD and has not had to use his inhaler. Patient stated he didn't know he had CHF and this was the second time he heard about it. Patient is hard of hearing. Patient has a cataract that needs to be removed in the rt eye and he is now having difficulty seeing out of that eye. Per patient the eye Dr is in [place] and it is hard to get a ride there. Patient does not have an advance directive or living will but has agreed for packet to be sent. Per patient his appetite is good. Patient family prepares his meals and bring to him. Patient daughter fixes his medication boxes. Patient has agreed for outreach calls.  Current Medications:  Current Outpatient Medications  Medication Sig Dispense Refill  . acetaminophen (TYLENOL) 325 MG tablet Take 2 tablets (650 mg total) by mouth every 6 (six) hours as needed for mild pain (or Fever >/= 101).    Marland Kitchen ALPRAZolam (XANAX) 0.25 MG tablet TAKE 1 TABLET BY MOUTH EVERY 12 HOURS AS NEEDED FOR ANXIETY 20 tablet 1  . atorvastatin (LIPITOR) 80 MG tablet TAKE 1 TABLET ONE TIME DAILY 90 tablet 3  . blood glucose meter kit and supplies KIT Dispense based on patient and insurance preference. Use up to four times daily as directed. 1 each 0  . fexofenadine (ALLEGRA) 180 MG tablet Take 180 mg by mouth daily as needed for allergies or rhinitis.    Marland Kitchen glimepiride (AMARYL) 4 MG tablet take 1 tablet by mouth WITH BREAKFAST.  90 tablet 1  . lisinopril (PRINIVIL,ZESTRIL) 20 MG tablet Take 1 tablet (20 mg total) by mouth daily. 90 tablet 1  . meclizine (ANTIVERT) 12.5 MG tablet Take 1 tablet (12.5 mg total) by mouth 3 (three) times daily as needed for dizziness. 30 tablet 0  . metFORMIN (GLUCOPHAGE) 1000 MG tablet Take 1 tablet (1,000 mg total) by mouth 2 (two) times daily with a meal. 360 tablet 1  . pantoprazole (PROTONIX) 40 MG tablet Take 1 tablet (40 mg total) by mouth daily. 90 tablet 1  . PARoxetine (PAXIL) 40 MG tablet Take 1 tablet (40 mg total) by mouth daily. 90 tablet 1  . primidone (MYSOLINE) 50 MG tablet Take 1.5 tablets (75 mg total) by mouth 2 (two) times daily. 270 tablet 1  . tiotropium (SPIRIVA) 18 MCG inhalation capsule Place 18 mcg as needed into inhaler and inhale.      No current facility-administered medications for this visit.     Functional Status:  In your present state of health, do you have any difficulty performing the following activities: 06/30/2017 06/30/2017  Hearing? Y Y  Comment - -  Vision? - Y  Comment - rt cataract  Difficulty concentrating or making decisions? - N  Walking or climbing stairs? - Y  Comment - -  Dressing or bathing? - N  Comment - -  Doing  errands, shopping? - Y  [place] and eating ? - N  Comment - -  Using the Toilet? - N  In the past six months, have you accidently leaked urine? - Y  Comment - -  Do you have problems with loss of bowel control? - N  Managing your Medications? - Y  Comment - -  Managing your Finances? - Y  Comment - -  Housekeeping or managing your Housekeeping? - Y  Comment - -  Some recent data might be hidden    Fall/Depression Screening: Fall Risk  06/30/2017 02/20/2017 02/17/2016  Falls in the past year? Yes Yes Yes  Number falls in past yr: '1 1 1  ' Injury with Fall? Yes No No  Risk Factor Category  High Fall Risk - -  Risk for fall due to : History of fall(s);Impaired balance/gait;Impaired  mobility;Impaired vision Impaired balance/gait;History of fall(s) Impaired balance/gait  Follow up Falls evaluation completed;Falls prevention discussed;Education provided Falls evaluation completed;Education provided Falls prevention discussed   PHQ 2/9 Scores 06/30/2017 02/20/2017 02/17/2016 01/08/2015  PHQ - 2 Score 0 0 0 0   THN CM Care Plan Problem One     Most Recent Value  Care Plan Problem One  Knowledge Deficit in Self Management of Diabetes  Role Documenting the Problem One  [place] for Problem One  Active  THN Long Term Goal   Patient will not have any falls within the next 90 days  THN Long Term Goal Start Date  06/30/17  Interventions for Problem One Long Term Goal  RN discussed fall and falls prevention. RN discussed medical alert system. RN sent EMMI educational material on fall  safety and how to get up. RN will follow up with further discussion and teach back  THN CM Short Term Goal #1   Patient will report checking his blood sugars at least twice a week within the next 30 days  THN CM Short Term Goal #1 Start Date  06/30/17  Interventions for Short Term Goal #1  RN discussed importance of checking blood sugars. RN sent EMMI education on checking blood sugars. RN sent a 2019 calendar book for documentation. RN will follow up with further discussion and teach back  THN CM Short Term Goal #2   Patient will have a better understanding of hypo and hyperglycemia within the next 30 days  THN CM Short Term Goal #2 Start Date  06/30/17  Interventions for Short Term Goal #2  RN discussed patient understanding of low and high blood sugars. RN sent picture sheet of hypo and hyperglycemia symptoms. RN will follow up with further  discussion  THN CM Short Term Goal #3  Patient will verbalize treceiving Advance Directive Packet within the next 30 days  THN CM Short Term Goal #3 Start Date  06/30/17  Interventions for Short Tern Goal #3  RN discussed with patient about Advance  directive and living will. Patient agreed to  [facility] sending a packet. RN will follow up on patient receiving and questions.       Assessment:  Patient checks his blood sugars on random days Patient does not have an Advance Directive or Living Will Family prepares most of patient meals and bring to him Daughter prepares medication boxes Patient will benefit from [place] telephonic outreach for education and support for diabetes self management Family takes patient for appointments Plan: RN sent 2019 calendar book RN sent Living Well with Diabetes Booklet  RN discussed checking blood sugars RN discussed medication adherence RN discussed signs and symptoms of hypo and hyperglycemia RN sent picture sheet of hypo and hyperglycemia RN sent EMMI on hyper and hypo glycemia RN discussed Advance Directive and living will RN sent patient Emergency planning/management officer RN discussed fall prevention and medical alert RN sent educational material on fall prevention and How to get up from a fall RN sent PCP barriers letter and assessment RN will follow up within the month of July  Madiha Bambrick [place] Management [phone]

## 2017-07-03 ENCOUNTER — Encounter: Payer: Self-pay | Admitting: *Deleted

## 2017-07-10 DIAGNOSIS — R69 Illness, unspecified: Secondary | ICD-10-CM | POA: Diagnosis not present

## 2017-07-19 ENCOUNTER — Telehealth: Payer: Self-pay | Admitting: Family Medicine

## 2017-07-19 NOTE — Telephone Encounter (Signed)
The patient left a message with [facility] wanting to know with which MD Dr. Caryl Bis referred him. I called the patient to see if he was referring to the urologist.  His voice mail was full therefore , I could not leave a message.

## 2017-07-20 ENCOUNTER — Other Ambulatory Visit: Payer: Self-pay | Admitting: Family Medicine

## 2017-07-27 ENCOUNTER — Other Ambulatory Visit: Payer: Self-pay | Admitting: Family Medicine

## 2017-07-27 NOTE — Telephone Encounter (Signed)
Last Ov 06/15/17 last filled 06/02/17 30 0rf

## 2017-07-27 NOTE — Telephone Encounter (Signed)
Please contact the patient and see why he needs this refill.  The last time I saw him he was not having any vertigo symptoms.  Thanks.

## 2017-07-28 NOTE — Telephone Encounter (Signed)
Patient called on [phone], no answer. The mobile number listed [phone] belong to his daughter, Chase Simmons who answered, I advised she was not on the DPR and she says she will try to get in touch with him.

## 2017-07-28 NOTE — Telephone Encounter (Signed)
Left message to return call, ok for pec to speak to patient about refill message below

## 2017-07-28 NOTE — Telephone Encounter (Signed)
Patient's daughter calling and states that she was unaware that the medication was called into the pharmacy. States that her brother may have called for a refill. States that the patient done not have continuous vertigo, but does have some spells on and off. States they like to have the medication at the house just in case.

## 2017-07-31 ENCOUNTER — Other Ambulatory Visit: Payer: Self-pay | Admitting: *Deleted

## 2017-07-31 ENCOUNTER — Telehealth: Payer: Self-pay

## 2017-07-31 NOTE — Patient Outreach (Signed)
[facility]) Care Management  07/31/2017  Chase Simmons [birth year] [id number]   RN Health Coach telephone call to patient.  Hipaa compliance verified. Per patient he has not received the information mailed to him. Patient stated that he had not been to the mailbox. Patient is going to have son go to the mailbox and [place]  is to call him back in a couple of days to go over the information. Plan: RN will call patient within 3-5 days  [place] Management [phone]

## 2017-07-31 NOTE — Telephone Encounter (Signed)
Copied from [place] [phone]. Topic: Quick Communication - See Telephone Encounter >> Jul 28, 2017 11:43 AM Juanda Chance, CMA wrote: CRM for notification. See Telephone encounter for: 07/28/17. Left message to return call, ok for pec to speak to patient about refill message below >> Jul 28, 2017  5:32 PM Marin Olp L wrote: Patient calling Janett Billow back. No notes in chart.

## 2017-07-31 NOTE — Telephone Encounter (Signed)
Left message to return call, ok for pec to speak to patient and notify him that he will need to be evaluated if he continues to have vertigo. Please document

## 2017-07-31 NOTE — Telephone Encounter (Signed)
fyi

## 2017-07-31 NOTE — Telephone Encounter (Signed)
Left message to return call, ok for pec to speak to patients daughter and inform her that we did send in the meclizine but if he continues to have intermittent vertigo he will need to be evaluated

## 2017-07-31 NOTE — Telephone Encounter (Signed)
Sent to pharmacy.  If the patient continues to have intermittent vertigo issues we could have him see ENT.

## 2017-08-02 ENCOUNTER — Other Ambulatory Visit: Payer: Self-pay | Admitting: *Deleted

## 2017-08-02 NOTE — Patient Outreach (Signed)
[facility]) Care Management  08/02/2017   Chase Simmons 11-20-1933 [id number]  RN Health Coach received return  telephone call from patient.  Hipaa compliance verified. Per patient he sent his son to the mailbox. Patient stated he hadn't been in several days because he is having some difficulty standing up from the wheelchair. . Per patient he did not receive the educational material that was sent by the RN Health Coach. Patient has agreed to follow up outreach calls.    Current Medications:  Current Outpatient Medications  Medication Sig Dispense Refill  . acetaminophen (TYLENOL) 325 MG tablet Take 2 tablets (650 mg total) by mouth every 6 (six) hours as needed for mild pain (or Fever >/= 101).    Marland Kitchen ALPRAZolam (XANAX) 0.25 MG tablet TAKE 1 TABLET BY MOUTH EVERY 12 HOURS AS NEEDED FOR ANXIETY 20 tablet 1  . atorvastatin (LIPITOR) 80 MG tablet TAKE 1 TABLET ONE TIME DAILY 90 tablet 3  . blood glucose meter kit and supplies KIT Dispense based on patient and insurance preference. Use up to four times daily as directed. 1 each 0  . fexofenadine (ALLEGRA) 180 MG tablet Take 180 mg by mouth daily as needed for allergies or rhinitis.    Marland Kitchen glimepiride (AMARYL) 4 MG tablet take 1 tablet by mouth WITH BREAKFAST. 90 tablet 1  . lisinopril (PRINIVIL,ZESTRIL) 20 MG tablet Take 1 tablet (20 mg total) by mouth daily. 90 tablet 1  . meclizine (ANTIVERT) 12.5 MG tablet TAKE 1 TABLET (12.5 MG TOTAL) BY MOUTH 3 (THREE) TIMES DAILY AS NEEDED FOR DIZZINESS. 30 tablet 0  . metFORMIN (GLUCOPHAGE) 1000 MG tablet Take 1 tablet (1,000 mg total) by mouth 2 (two) times daily with a meal. 360 tablet 1  . metFORMIN (GLUCOPHAGE) 500 MG tablet TAKE 1 TABLET BY MOUTH TWICE A DAY 180 tablet 2  . pantoprazole (PROTONIX) 40 MG tablet Take 1 tablet (40 mg total) by mouth daily. 90 tablet 1  . PARoxetine (PAXIL) 40 MG tablet Take 1 tablet (40 mg total) by mouth daily. 90 tablet 1  . primidone (MYSOLINE) 50 MG  tablet Take 1.5 tablets (75 mg total) by mouth 2 (two) times daily. 270 tablet 1  . tiotropium (SPIRIVA) 18 MCG inhalation capsule Place 18 mcg as needed into inhaler and inhale.      No current facility-administered medications for this visit.     Functional Status:  In your present state of health, do you have any difficulty performing the following activities: 06/30/2017 06/30/2017  Hearing? Y Y  Comment - -  Vision? - Y  Comment - rt cataract  Difficulty concentrating or making decisions? - N  Walking or climbing stairs? - Y  Comment - -  Dressing or bathing? - N  Comment - -  Doing errands, shopping? - Y  [place] and eating ? - N  Comment - -  Using the Toilet? - N  In the past six months, have you accidently leaked urine? - Y  Comment - -  Do you have problems with loss of bowel control? - N  Managing your Medications? - Y  Comment - -  Managing your Finances? - Y  Comment - -  Housekeeping or managing your Housekeeping? - Y  Comment - -  Some recent data might be hidden    Fall/Depression Screening: Fall Risk  08/02/2017 06/30/2017 02/20/2017  Falls in the past year? Yes Yes Yes  Number falls in past  yr: _0 Injury with Fall? Yes Yes No  Risk Factor Category  High Fall Risk High Fall Risk -  Risk for fall due to : History of fall(s);Impaired balance/gait;Impaired mobility History of fall(s);Impaired balance/gait;Impaired mobility;Impaired vision Impaired balance/gait;History of fall(s)  Follow up Falls evaluation completed;Falls prevention discussed;Education provided Falls evaluation completed;Falls prevention discussed;Education provided Falls evaluation completed;Education provided   [facility] 2/9 Scores 08/02/2017 06/30/2017 02/20/2017 02/17/2016 01/08/2015  PHQ - 2 Score 0 0 0 0 0   THN CM Care Plan Problem One     Most Recent Value  Care Plan Problem One  Knowledge Deficit in Self Management of Diabetes  Care Plan for Problem One  Active  THN Long  Term Goal   Patient will not have any falls within the next 90 days  THN Long Term Goal Start Date  08/02/17  Interventions for Problem One Long Term Goal  RN reiterates fall prevention. RN resent Neurosurgeon on medical alert. RN will follow up with next outreach.  THN CM Short Term Goal #1   Patient will report checking his blood sugars at least twice a week within the next 30 days  THN CM Short Term Goal #1 Start Date  08/02/17  Interventions for Short Term Goal #1  RN had discussed the importance of checking blood sugars. Patient staed he had not received any of the educational material. RN sent EMMI education and calendar book.  THN CM Short Term Goal #2   Patient will have a better understanding of hypo and hyperglycemia within the next 30 days  THN CM Short Term Goal #2 Start Date  08/02/17  Interventions for Short Term Goal #2  RN resent the educational material on hypo and hyperglycemia. RN will follow up with further discussion once patient receives and is able to look over information.   THN CM Short Term Goal #3  Patient will verbalize treceiving Advance Directive Packet within the next 30 days  THN CM Short Term Goal #3 Start Date  08/02/17  Interventions for Short Tern Goal #3  Chase Simmons will resnd information an follow up on patient  receiving.       Assessment:  Per patient he did not receive the educational packet RN Health Coach sent  Plan:  RN resent educational material  2019 Calendar Book Living Well with Diabetes Facial sheet of hypo and hyperglycemia Advance Directive Packet Fall Prevention educational material   [place] Management [phone]

## 2017-08-07 DIAGNOSIS — R69 Illness, unspecified: Secondary | ICD-10-CM | POA: Diagnosis not present

## 2017-08-10 NOTE — Telephone Encounter (Signed)
No return call 

## 2017-08-17 ENCOUNTER — Other Ambulatory Visit: Payer: Self-pay | Admitting: Family Medicine

## 2017-08-17 NOTE — Telephone Encounter (Signed)
Sent to pharmacy.  Controlled substance database reviewed. 

## 2017-08-17 NOTE — Telephone Encounter (Signed)
Last OV 06/15/17 last filled 06/15/17 20 1rf

## 2017-08-21 ENCOUNTER — Other Ambulatory Visit: Payer: Self-pay | Admitting: Family Medicine

## 2017-08-21 NOTE — Telephone Encounter (Signed)
Last OV 06/15/17 last filled 07/31/17 30 0rf

## 2017-08-21 NOTE — Telephone Encounter (Signed)
Please contact this patient and see if he is continuing to have vertigo and dizziness.  He needs to be evaluated again if he is.

## 2017-08-22 ENCOUNTER — Ambulatory Visit: Payer: Self-pay | Admitting: *Deleted

## 2017-08-22 IMAGING — CT CT HEAD W/O CM
3 series · 16 of 47 positions shown, 19 images · non-contrast
Comparison: 07/05/2016

CLINICAL DATA: Vertigo.

EXAM:
CT HEAD WITHOUT CONTRAST
TECHNIQUE: Contiguous axial images were obtained from the base of the skull
through the vertex without intravenous contrast.

[Series 2: head wo · axial · 0.44mm/px · z∈[-104,+26]mm · 10 of 32 slices shown, 13 images]
[im 3/32  brain]
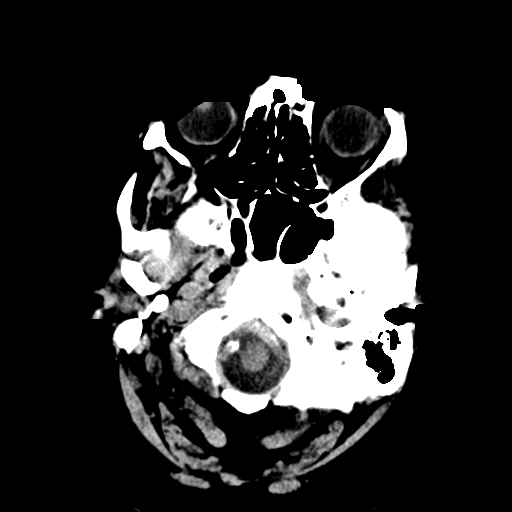
[im 3/32  bone]
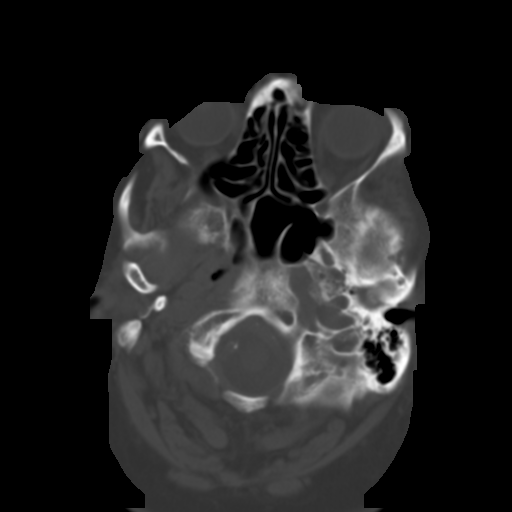
[im 6/32  brain]
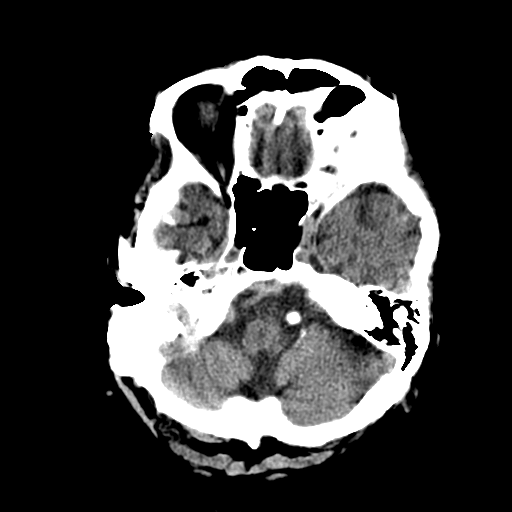
[im 9/32  brain]
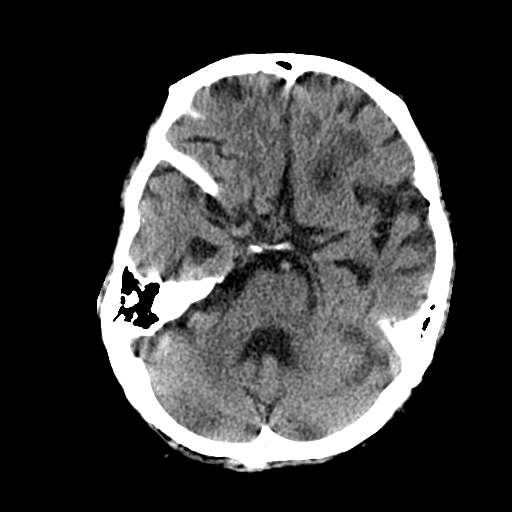
[im 11/32  brain]
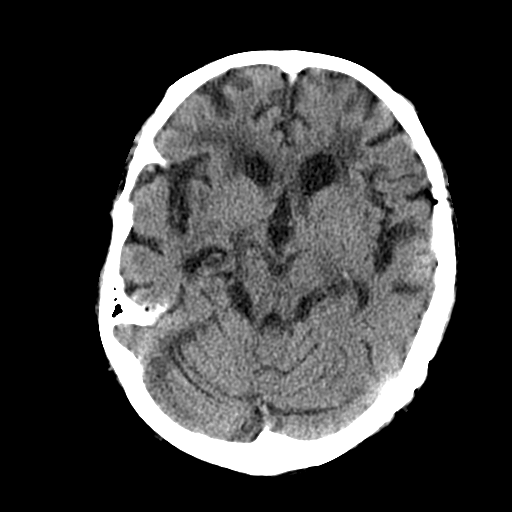
[im 14/32  brain]
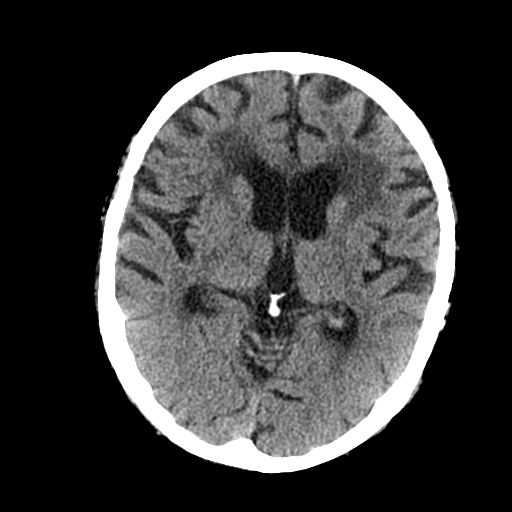
[im 14/32  bone]
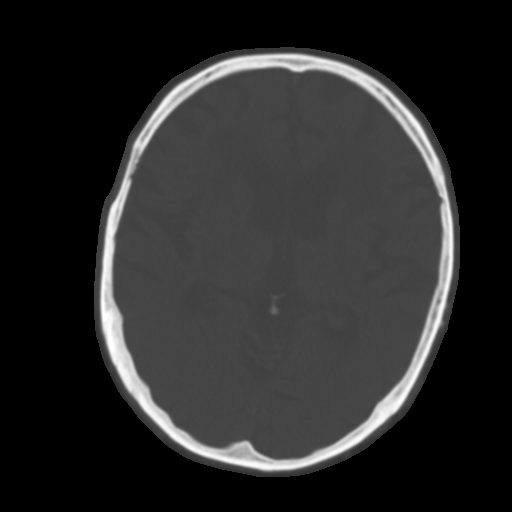
[im 18/32  brain]
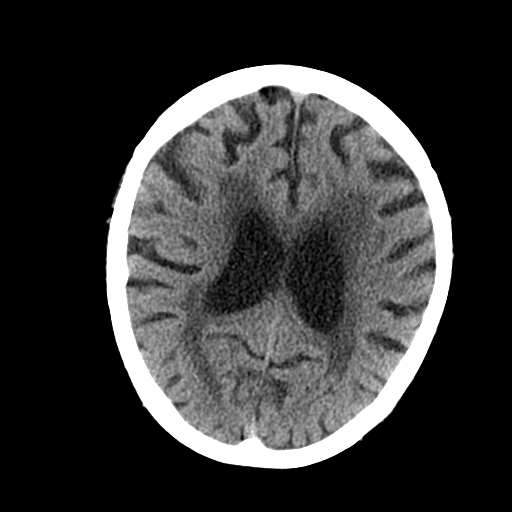
[im 21/32  brain]
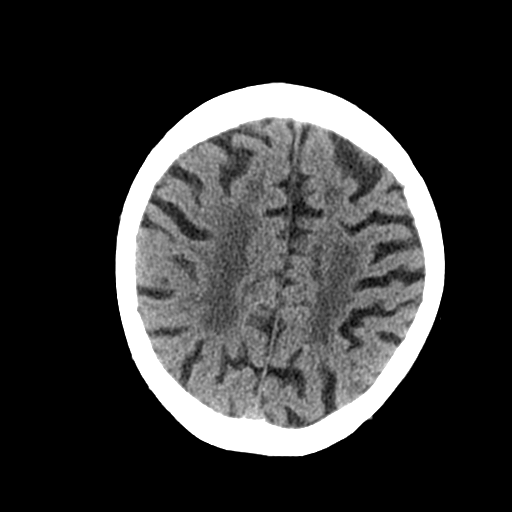
[im 24/32  brain]
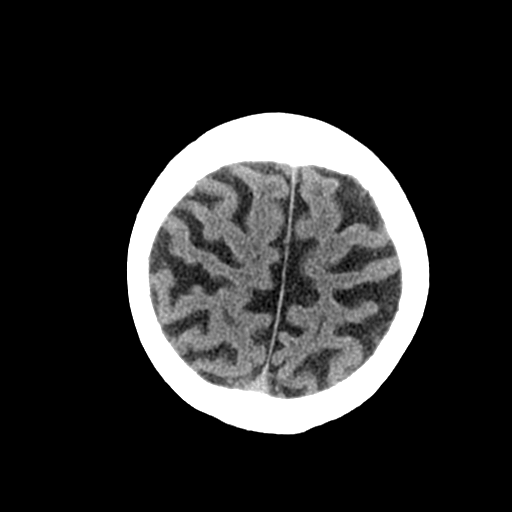
[im 26/32  brain]
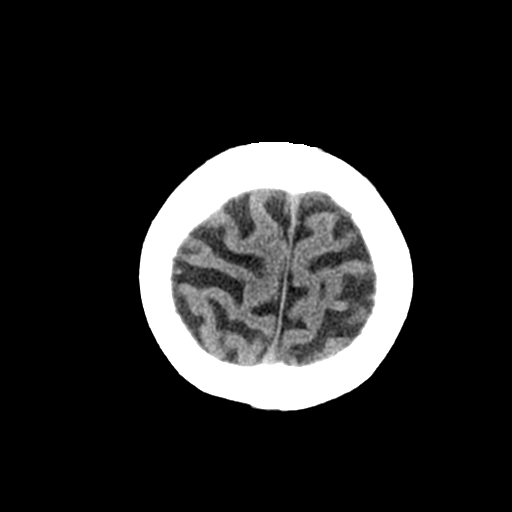
[im 26/32  bone]
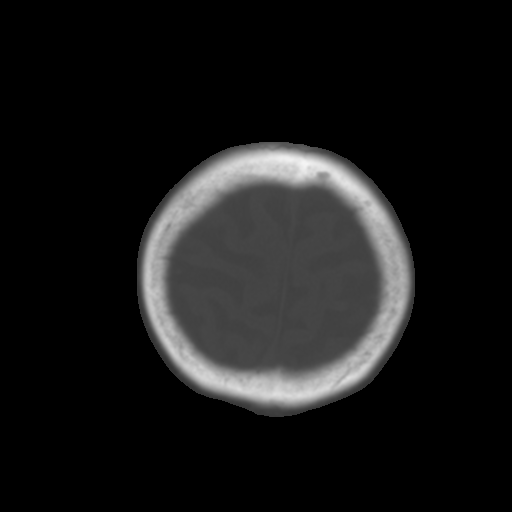
[im 29/32  brain]
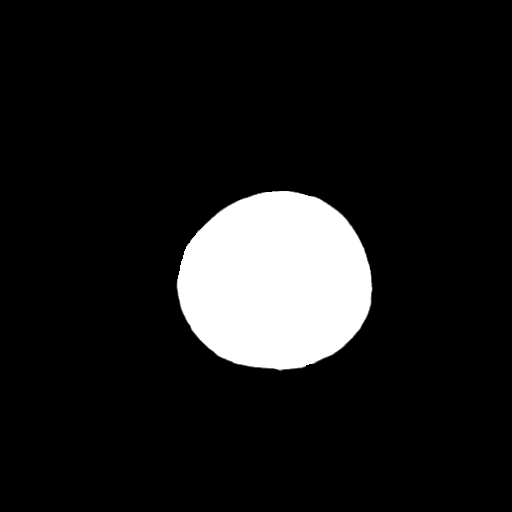

[Series 4: coronal soft tissue · coronal · 0.35mm/px · 3 of 79 slices shown]
[im 27/79  brain]
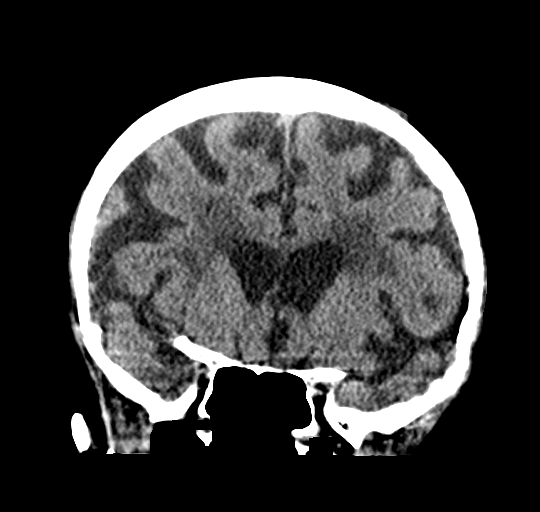
[im 35/79  brain]
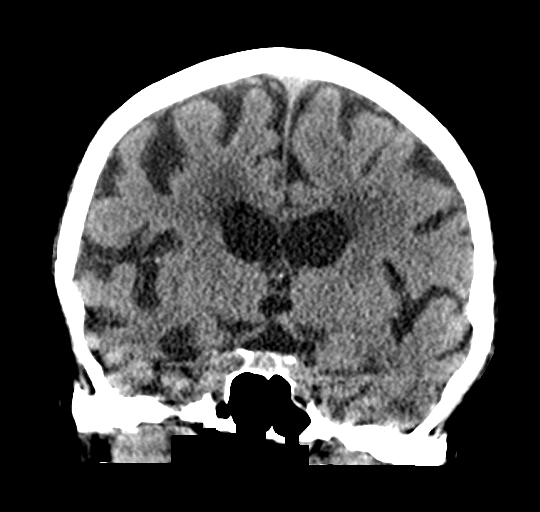
[im 44/79  brain]
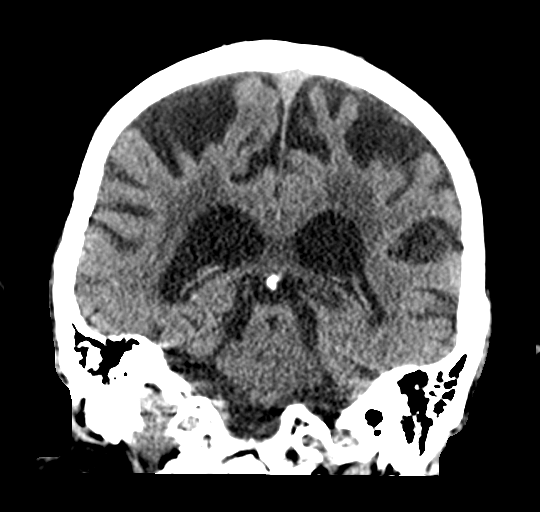

[Series 5: sagittal soft tissue · sagittal · 0.36mm/px · 3 of 65 slices shown]
[im 22/65  brain]
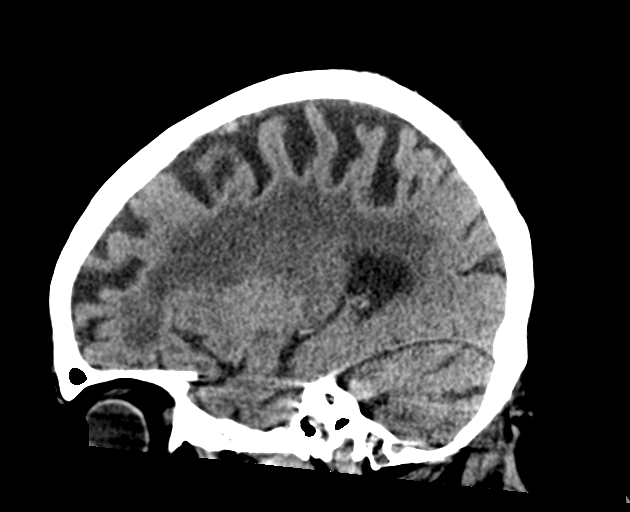
[im 33/65  brain]
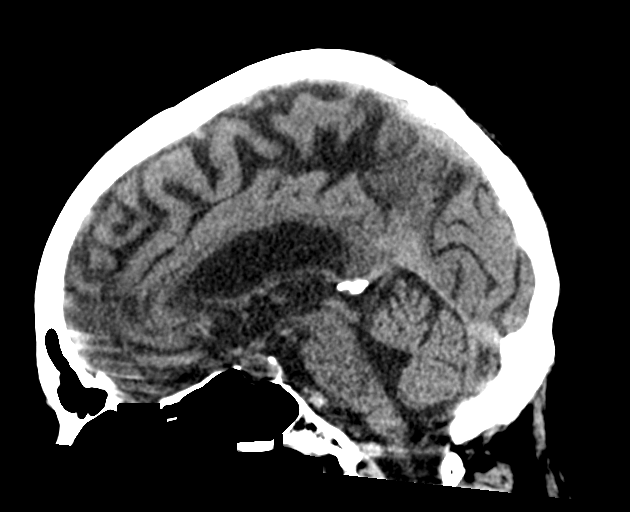
[im 43/65  brain]
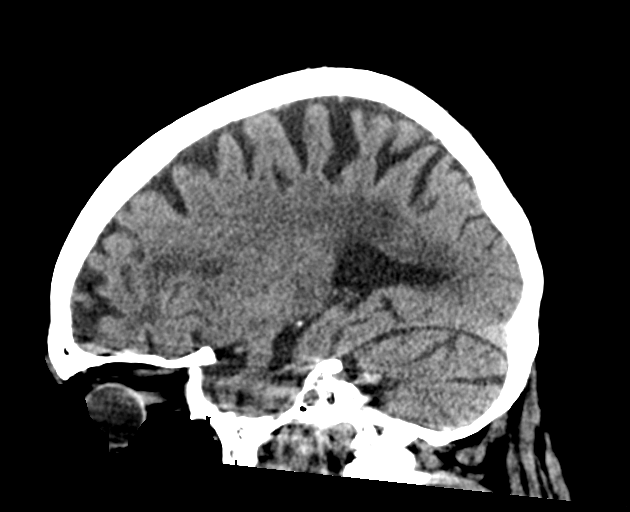

[16 of 47 positions shown; findings below may reference images not displayed]

FINDINGS: Brain: There is no evidence for acute hemorrhage, hydrocephalus,
mass lesion, or abnormal extra-axial fluid collection. No definite
CT evidence for acute infarction. Diffuse loss of parenchymal volume
is consistent with atrophy.

Vascular: No hyperdense vessel or unexpected calcification.

Skull: No evidence for fracture. No worrisome lytic or sclerotic
lesion.

Sinuses/Orbits: Visualized portions of the globes and intraorbital
fat are unremarkable. The visualized paranasal sinuses and mastoid
air cells are clear.

Other: None.
IMPRESSION: 1. Stable exam.  No acute intracranial abnormality.
2. Atrophy with chronic small vessel white matter ischemic disease.

## 2017-08-22 NOTE — Telephone Encounter (Signed)
This encounter was created in error - please disregard.

## 2017-08-22 NOTE — Telephone Encounter (Signed)
FYI tried to call for more information no answer please try to call patient later.   Routing comment     Nanci Pina, LPN 30 minutes ago (5:18 PM)     Tried to reach patient no answer. Left message to return call to office .      Documentation     Addison Naegeli, RN  [facility] 48 minutes ago (12:43 PM)   Pt seen 06/15/17 by Dr Caryl Bis   Routing comment     Dyanne Iha [phone]  Addison Naegeli, RN 1 hour ago (12:25 PM)    Addison Naegeli, RN 1 hour ago (12:25 PM)     Pt's daughter, [name], called back in reference to the pt's symptoms; nurse triage initiated and recommendations made per protocol to include seeing a physician within 3 days; she says that her brother [name] will have to schedule this appointment, and she is if can be made within 3 days; also she says discussion about placing her father into a nursing home; will route to office for notification of this encounter.   Reason for Disposition . [1] MODERATE dizziness (e.g., vertigo; feels very unsteady, interferes with normal activities) AND [2] has been evaluated by physician for this  Answer Assessment - Initial Assessment Questions 1. DESCRIPTION: "Describe your dizziness."     dizziness 2. VERTIGO: "Do you feel like either you or the room is spinning or tilting?"      yes 3. LIGHTHEADED: "Do you feel lightheaded?" (e.g., somewhat faint, woozy, weak upon standing)     Pt's daughter is unable to answer 4. SEVERITY: "How bad is it?"  "Can you walk?"   - MILD - Feels unsteady but walking normally.   - MODERATE - Feels very unsteady when walking, but not falling; interferes with normal activities (e.g., school, work) .   - SEVERE - Unable to walk without falling (requires assistance).     Varies from mild to severe 5. ONSET:  "When did the dizziness begin?"     Seen in office 06/15/17 6. AGGRAVATING FACTORS: "Does anything make it worse?" (e.g., standing, change in head position)     Pt's  daughter is not sure; says it happens at different times 7. CAUSE: "What do you think is causing the dizziness?"     Not sure 8. RECURRENT SYMPTOM: "Have you had dizziness before?" If so, ask: "When was the last time?" "What happened that time?"     08/19/17; EMS called 9. OTHER SYMPTOMS: "Do you have any other symptoms?" (e.g., headache, weakness, numbness, vomiting, earache)     vomiting 10. PREGNANCY: "Is there any chance you are pregnant?" "When was your last menstrual period?"       n/a  Protocols used: DIZZINESS - VERTIGO-A-AH        Documentation

## 2017-08-22 NOTE — Telephone Encounter (Signed)
Pt's daughter, Chase Simmons, called back in reference to the pt's symptoms; nurse triage initiated and recommendations made per protocol to include seeing a physician within 3 days; she says that her brother Chase Simmons will have to schedule this appointment, and she is if can be made within 3 days; also she says discussion about placing her father into a nursing home; will route to office for notification of this encounter.   Reason for Disposition . [1] MODERATE dizziness (e.g., vertigo; feels very unsteady, interferes with normal activities) AND [2] has been evaluated by physician for this  Answer Assessment - Initial Assessment Questions 1. DESCRIPTION: "Describe your dizziness."     dizziness 2. VERTIGO: "Do you feel like either you or the room is spinning or tilting?"      yes 3. LIGHTHEADED: "Do you feel lightheaded?" (e.g., somewhat faint, woozy, weak upon standing)     Pt's daughter is unable to answer 4. SEVERITY: "How bad is it?"  "Can you walk?"   - MILD - Feels unsteady but walking normally.   - MODERATE - Feels very unsteady when walking, but not falling; interferes with normal activities (e.g., school, work) .   - SEVERE - Unable to walk without falling (requires assistance).     Varies from mild to severe 5. ONSET:  "When did the dizziness begin?"     Seen in office 06/15/17 6. AGGRAVATING FACTORS: "Does anything make it worse?" (e.g., standing, change in head position)     Pt's daughter is not sure; says it happens at different times 7. CAUSE: "What do you think is causing the dizziness?"     Not sure 8. RECURRENT SYMPTOM: "Have you had dizziness before?" If so, ask: "When was the last time?" "What happened that time?"     08/19/17; EMS called 9. OTHER SYMPTOMS: "Do you have any other symptoms?" (e.g., headache, weakness, numbness, vomiting, earache)     vomiting 10. PREGNANCY: "Is there any chance you are pregnant?" "When was your last menstrual period?"       n/a  Protocols used:  DIZZINESS - VERTIGO-A-AH

## 2017-08-22 NOTE — Telephone Encounter (Signed)
Left message to return call, ok for pec to speak to patient and see if he is continuing to have vertigo and dizziness. If he is then he will need to be evaluated. [place] for [employer] to schedule

## 2017-08-22 NOTE — Addendum Note (Signed)
Addended by: Addison Naegeli on: 08/22/2017 12:36 PM   Modules accepted: Level of Service, SmartSet

## 2017-08-22 NOTE — Telephone Encounter (Signed)
Tried to reach patient no answer. Left message to return call to office .

## 2017-08-22 NOTE — Telephone Encounter (Signed)
Attempted to triage patient to talk about his possible dizziness. Daughter on the DPR will be calling the office back regarding this.

## 2017-08-22 NOTE — Telephone Encounter (Signed)
See rx request note

## 2017-08-22 NOTE — Telephone Encounter (Signed)
He needs to be evaluated before this can be refilled.  Thanks.

## 2017-08-22 NOTE — Telephone Encounter (Signed)
meclzine refill Last Refill:07/31/17 # 30 Last OV: 06/15/17 PCP: Dr Caryl Bis Pharmacy: [facility] [address] [place], [place]  Also see nurse triage note dated 08/22/17

## 2017-08-22 NOTE — Telephone Encounter (Signed)
  Answer Assessment - Initial Assessment Questions 1. DESCRIPTION: "Describe your dizziness."     vertigo 2. VERTIGO: "Do you feel like either you or the room is spinning or tilting?"      yes 3. LIGHTHEADED: "Do you feel lightheaded?" (e.g., somewhat faint, woozy, weak upon standing)     Pt's daughter is unable to answer 4. SEVERITY: "How bad is it?"  "Can you walk?"   - MILD - Feels unsteady but walking normally.   - MODERATE - Feels very unsteady when walking, but not falling; interferes with normal activities (e.g., school, work) .   - SEVERE - Unable to walk without falling (requires assistance).    Varies from mild to severe 5. ONSET:  "When did the dizziness begin?"     6 weeks ago 6. AGGRAVATING FACTORS: "Does anything make it worse?" (e.g., standing, change in head position)    Pt's daughter is not sure; says it happens at different times 7. CAUSE: "What do you think is causing the dizziness?"     unsure 8. RECURRENT SYMPTOM: "Have you had dizziness before?" If so, ask: "When was the last time?" "What happened that time?"    Yes 08/19/17; per pt's daughter called EMS 9. OTHER SYMPTOMS: "Do you have any other symptoms?" (e.g., headache, weakness, numbness, vomiting, earache)    Nausea, vomiting, feels like he is going to pass out 10. PREGNANCY: "Is there any chance you are pregnant?" "When was your last menstrual period?"       n/a  Protocols used: DIZZINESS - VERTIGO-A-AH

## 2017-08-23 NOTE — Telephone Encounter (Signed)
Patient son will call to schedule appointment

## 2017-08-29 ENCOUNTER — Encounter: Payer: Self-pay | Admitting: Urology

## 2017-08-29 ENCOUNTER — Ambulatory Visit: Payer: Medicare HMO | Admitting: Urology

## 2017-08-29 VITALS — BP 110/61 | HR 46 | Ht 70.0 in | Wt 250.0 lb

## 2017-08-29 DIAGNOSIS — E291 Testicular hypofunction: Secondary | ICD-10-CM

## 2017-08-29 DIAGNOSIS — Z8551 Personal history of malignant neoplasm of bladder: Secondary | ICD-10-CM

## 2017-08-29 DIAGNOSIS — C679 Malignant neoplasm of bladder, unspecified: Secondary | ICD-10-CM | POA: Diagnosis not present

## 2017-08-29 LAB — MICROSCOPIC EXAMINATION
RBC, UA: NONE SEEN /hpf (ref 0–2)
WBC, UA: NONE SEEN /hpf (ref 0–5)

## 2017-08-29 LAB — URINALYSIS, COMPLETE
Bilirubin, UA: NEGATIVE
KETONES UA: NEGATIVE
LEUKOCYTES UA: NEGATIVE
NITRITE UA: NEGATIVE
Protein, UA: NEGATIVE
RBC, UA: NEGATIVE
Specific Gravity, UA: 1.02 (ref 1.005–1.030)
Urobilinogen, Ur: 0.2 mg/dL (ref 0.2–1.0)
pH, UA: 5 (ref 5.0–7.5)

## 2017-08-29 NOTE — Progress Notes (Signed)
08/29/2017 5:09 PM   Chase Simmons [birth year] [id number]  Referring provider: Leone Haven, MD [address] [place], [place] [address]  Chief Complaint  Patient presents with  . Bladder Cancer    New Patient    HPI: 84 year old male who presents today to establish care.  He is previously followed by Dr. Jacqlyn Larsen for multiple GU issues.   He has a personal history of bladder cancer.  It is difficult to ascertain from his previous notes but it appears that he has not had a true recurrence in greater than 10 years.  Original pathology is unknown.   He was had multiple recurrence with office fulguration.  He did have a bladder biopsy in the office in 10/2015 which showed early noninvasive low-grade biliary urothelial carcinoma.  He undergoes cystoscopy every 6 months.  His last cystoscopy in 02/2017 was somewhat suspicious for early recurrence.  He is due for cystoscopy.    He also has a personal history of phimosis with recurrent balanitis.  He denies any recent issues with this.    He was also managed by Dr. Jacqlyn Larsen for hypogonadism.  He was on Testerone injections but is no longer pursuing this.  He noted little benefit from this and is no longer interested in pursuing/treating this.     No urinary complaints.    His most recent PSA was 0.38 on 05/2014.  Patient is a poor historian and above provided by chart.  Patient was initially company by his daughter but she left prior to MD coming into the room.  PMH: Past Medical History:  Diagnosis Date  . Allergy   . Bladder cancer ([place])    a. followed by Dr. Jacqlyn Larsen  . Chronic combined systolic (congestive) and diastolic (congestive) heart failure ([place])    a. 07/2016 Echo: EF 30-35%, sev mid-apicalanteroseptal, ant, inf HK, Gr1 DD.  Marland Kitchen COPD (chronic obstructive pulmonary disease) ([place])   . Hyperlipidemia   . Hypertension   . NICM (nonischemic cardiomyopathy) ([place])    a. 07/2016 Echo: EF 30-35%, sev mid-apicalanteroseptal,  ant, inf HK, Gr1 DD, mild MR, mildly dil LA, PASP 43mHg - ? stress induced CM.  .Marland KitchenNonobstructive Coronary atherosclerosis    a. 07/2016 Cath: LM nl, LAD nl, D1 40ost, LCX 248mRCA nl, EF 25-30%.  . Marland KitchenAF (paroxysmal atrial fibrillation) ([place]   a. Dx 08/2013. CHA2DS2VASc = 6-->no OAC 2/2 h/o falls and nocompliance.  . Tremor, essential    sees [facility] neurologist  . Type 2 diabetes mellitus with complication, without long-term current use of insulin ([facility] [place]/30/2014  . Urinary incontinence     Surgical History: Past Surgical History:  Procedure Laterality Date  . bladder cancer    . CATARACT EXTRACTION    . HERNIA REPAIR    . LEFT HEART CATH AND CORONARY ANGIOGRAPHY N/A 07/11/2016   Procedure: Left Heart Cath and Coronary Angiography;  Surgeon: ArWellington HampshireMD;  [facility];  Service: Cardiovascular;  Laterality: N/A;    Home Medications:  Allergies as of 08/29/2017      Reactions   Latex Itching      Medication List        Accurate as of 08/29/17  5:09 PM. Always use your most recent med list.          acetaminophen 325 MG tablet Commonly known as:  TYLENOL Take 2 tablets (650 mg total) by mouth every 6 (six) hours as needed for mild pain (or  Fever >/= 101).   ALPRAZolam 0.25 MG tablet Commonly known as:  XANAX TAKE 1 TABLET BY MOUTH EVERY 12 HOURS AS NEEDED FOR ANXIETY   atorvastatin 80 MG tablet Commonly known as:  LIPITOR TAKE 1 TABLET ONE TIME DAILY   blood glucose meter kit and supplies Kit Dispense based on patient and insurance preference. Use up to four times daily as directed.   fexofenadine 180 MG tablet Commonly known as:  ALLEGRA Take 180 mg by mouth daily as needed for allergies or rhinitis.   glimepiride 4 MG tablet Commonly known as:  AMARYL take 1 tablet by mouth WITH BREAKFAST.   lisinopril 20 MG tablet Commonly known as:  PRINIVIL,ZESTRIL Take 1 tablet (20 mg total) by mouth daily.   meclizine 12.5 MG tablet Commonly  known as:  ANTIVERT TAKE 1 TABLET (12.5 MG TOTAL) BY MOUTH 3 (THREE) TIMES DAILY AS NEEDED FOR DIZZINESS.   metFORMIN 1000 MG tablet Commonly known as:  GLUCOPHAGE Take 1 tablet (1,000 mg total) by mouth 2 (two) times daily with a meal.   metFORMIN 500 MG tablet Commonly known as:  GLUCOPHAGE TAKE 1 TABLET BY MOUTH TWICE A DAY   pantoprazole 40 MG tablet Commonly known as:  PROTONIX Take 1 tablet (40 mg total) by mouth daily.   PARoxetine 40 MG tablet Commonly known as:  PAXIL Take 1 tablet (40 mg total) by mouth daily.   primidone 50 MG tablet Commonly known as:  MYSOLINE Take 1.5 tablets (75 mg total) by mouth 2 (two) times daily.   tiotropium 18 MCG inhalation capsule Commonly known as:  SPIRIVA Place 18 mcg as needed into inhaler and inhale.       Allergies:  Allergies  Allergen Reactions  . Latex Itching    Family History: Family History  Problem Relation Age of Onset  . Arthritis Mother   . Dementia Mother   . Cancer Maternal Uncle        prostate  . Cancer Maternal Aunt        breast cancer    Social History:  reports that he has quit smoking. He has a 60.00 pack-year smoking history. He has never used smokeless tobacco. He reports that he drinks about 12.0 standard drinks of alcohol per week. He reports that he does not use drugs.  ROS: UROLOGY Frequent Urination?: Yes Hard to postpone urination?: Yes Burning/pain with urination?: No Get up at night to urinate?: Yes Leakage of urine?: Yes Urine stream starts and stops?: No Trouble starting stream?: No Do you have to strain to urinate?: No Blood in urine?: No Urinary tract infection?: No Sexually transmitted disease?: No Injury to kidneys or bladder?: No Painful intercourse?: No Weak stream?: No Erection problems?: No Penile pain?: No  Gastrointestinal Nausea?: Yes Vomiting?: Yes Indigestion/heartburn?: No Diarrhea?: No Constipation?: No  Constitutional Fever: No Night sweats?:  No Weight loss?: No Fatigue?: No  Skin Skin rash/lesions?: No Itching?: No  Eyes Blurred vision?: Yes Double vision?: No  Ears/Nose/Throat Sore throat?: No Sinus problems?: No  Hematologic/Lymphatic Swollen glands?: No Easy bruising?: Yes  Cardiovascular Leg swelling?: No Chest pain?: No  Respiratory Cough?: No Shortness of breath?: Yes  Endocrine Excessive thirst?: No  Musculoskeletal Back pain?: No Joint pain?: Yes  Neurological Headaches?: No Dizziness?: Yes  Psychologic Depression?: Yes Anxiety?: Yes  Physical Exam: BP 110/61   Pulse (!) 46   Ht _0  (1.778 m)   Wt 250 lb (113.4 kg)   BMI 35.87 kg/m   Constitutional:  Alert and oriented, No acute distress.  In wheelchair hard of hearing, answers questions appropriately but does not know the answers to the majority.  He is unaccompanied (dropped off by daughter) HEENT: [place] AT, moist mucus membranes.  Trachea midline, no masses. Cardiovascular: No clubbing, cyanosis, or edema. Respiratory: Normal respiratory effort, no increased work of breathing. Skin: No rashes, bruises or suspicious lesions. Neurologic: Grossly intact, no focal deficits, moving all 4 extremities. Psychiatric: Normal mood and affect.  Laboratory Data: Lab Results  Component Value Date   WBC 6.2 06/15/2017   HGB 12.6 (L) 06/15/2017   HCT 37.9 (L) 06/15/2017   MCV 104.7 (H) 06/15/2017   PLT 166.0 06/15/2017    Lab Results  Component Value Date   CREATININE 1.26 06/15/2017    Lab Results  Component Value Date   HGBA1C 6.6 (H) 06/15/2017    Urinalysis Results for orders placed or performed in visit on 08/29/17  Microscopic Examination  Result Value Ref Range   WBC, UA None seen 0 - 5 /hpf   RBC, UA None seen 0 - 2 /hpf   Epithelial Cells (non renal) 0-10 0 - 10 /hpf   Mucus, UA Present (A) Not Estab.   Bacteria, UA Few (A) None seen/Few  Urinalysis, Complete  Result Value Ref Range   Specific Gravity, UA 1.020  1.005 - 1.030   pH, UA 5.0 5.0 - 7.5   Color, UA Yellow Yellow   Appearance Ur Clear Clear   Leukocytes, UA Negative Negative   Protein, UA Negative Negative/Trace   Glucose, UA Trace (A) Negative   Ketones, UA Negative Negative   RBC, UA Negative Negative   Bilirubin, UA Negative Negative   Urobilinogen, Ur 0.2 0.2 - 1.0 mg/dL   Nitrite, UA Negative Negative   Microscopic Examination See below:     Pertinent Imaging: N/a  Assessment & Plan:    1. History of bladder cancer Remote history of recurrent bladder cancer, original pathology unknown Most recent documented recurrence in 2017, low-grade noninvasive We will continue to pursue office cystoscopy, scheduled for ASAP - Urinalysis, Complete  2. Hypogonadism in male Not interested in any further intervention/ treatment   Return for next availible cystoscopy.  Hollice Espy, MD  [facility] [address] [place], [place] [address] [phone]

## 2017-08-31 ENCOUNTER — Ambulatory Visit: Payer: Self-pay | Admitting: *Deleted

## 2017-09-01 ENCOUNTER — Other Ambulatory Visit: Payer: Self-pay | Admitting: Family Medicine

## 2017-09-14 ENCOUNTER — Ambulatory Visit (INDEPENDENT_AMBULATORY_CARE_PROVIDER_SITE_OTHER): Payer: Medicare HMO | Admitting: Urology

## 2017-09-14 VITALS — BP 131/79 | HR 49 | Ht 71.0 in | Wt 230.0 lb

## 2017-09-14 DIAGNOSIS — Z8551 Personal history of malignant neoplasm of bladder: Secondary | ICD-10-CM | POA: Diagnosis not present

## 2017-09-14 DIAGNOSIS — N481 Balanitis: Secondary | ICD-10-CM

## 2017-09-14 DIAGNOSIS — N471 Phimosis: Secondary | ICD-10-CM

## 2017-09-14 MED ORDER — NYSTATIN-TRIAMCINOLONE 100000-0.1 UNIT/GM-% EX OINT: 1.0000 "application " | TOPICAL_OINTMENT | Freq: Two times a day (BID) | CUTANEOUS | 0 refills | Status: DC

## 2017-09-14 NOTE — Progress Notes (Signed)
   09/14/17  CC:  Chief Complaint  Patient presents with  . Cysto    HPI: 84 year old male with personal history of bladder cancer who recently establish care who presents today for surveillance cystoscopy.  He has a personal history of bladder cancer.  It is difficult to ascertain from his previous notes but it appears that he has not had a true recurrence in greater than 10 years.  Original pathology is unknown.   He was had multiple recurrence with office fulguration.  He did have a bladder biopsy in the office in 10/2015 which showed early noninvasive low-grade biliary urothelial carcinoma.  He undergoes cystoscopy every 6 months.  His last cystoscopy in 02/2017 was somewhat suspicious for early recurrence.  He is due for cystoscopy.   He also has a personal history of phimosis with recurrent balanitis.   He is not interested in circumcision unless is absolutely needed.  NED. A&Ox3.   No respiratory distress   Abd soft, NT, ND Normal phallus with bilateral descended testicles  Cystoscopy Procedure Note  Patient identification was confirmed, informed consent was obtained, and patient was prepped using Betadine solution.  Lidocaine jelly was administered per urethral meatus.    Preoperative abx where received prior to procedure.     Pre-Procedure: - Inspection reveals a normal caliber ureteral meatus.  Procedure: The flexible cystoscope was introduced without difficulty - No urethral strictures/lesions are present. - Normal prostate  - Normal bladder neck - Bilateral ureteral orifices identified - Bladder mucosa  reveals no ulcers, tumors, or lesions.  Supple stellate scars appreciated. - No bladder stones - Mild trabeculation with large [place] diverticulum vs. hypertrophy collagen  fibers  Retroflexion unmremarkable   Post-Procedure: - Patient tolerated the procedure well  Assessment/ Plan:  1. History of bladder cancer No evidence of recurrent disease today  on cystoscopy We will continue every 6 month surveillance cystoscopy was given his last recurrence in 2017, will likely transition to annually if next cystoscopy is negative - Urinalysis, Complete  2. Phimosis Significant phimosis with balanitis today Patient not interested in circumcision We will prescribe Mycolog ointment to be used twice daily  3. Balanitis As above   Return in about 6 months (around 03/15/2018) for cysto.   Hollice Espy, MD

## 2017-09-14 NOTE — Telephone Encounter (Signed)
Dr Caryl Bis said he needs appt before this is refilled 3 weeks ago, he has not been seen yet  I am not going to override Dr Caryl Bis.

## 2017-09-14 NOTE — Telephone Encounter (Signed)
rx request Last visit 06-15-17 Next visit 12-15-17 Last filled on 07-31-17

## 2017-09-18 ENCOUNTER — Other Ambulatory Visit: Payer: Self-pay | Admitting: Family Medicine

## 2017-09-19 NOTE — Telephone Encounter (Signed)
Last OV 06/15/2017   Last refilled 07/31/2017 disp 30 with no refills   Sent to PCP for approval

## 2017-09-19 NOTE — Telephone Encounter (Signed)
This patient needs to be evaluated in the office if he is continuing to require meclizine. Please contact him or his family to get him set up for a visit. Please also see what symptoms he is having that require the meclizine. Thanks.

## 2017-09-20 NOTE — Telephone Encounter (Signed)
Called and spoke with pt's daughter she stated that he father is having vertigo symptoms. We were trying to schedule her father for an appt but struggled to do so with difficulty having a good day for him to come in with her work schedule and Dr. Larena Sox availability.   Pt's daughter stated that she will try and call back to make an appt.

## 2017-09-21 NOTE — Telephone Encounter (Signed)
He could be scheduled with Ander Purpura or Joycelyn Schmid for evaluation of vertigo if there is a time that works better for the patient. If needed her could be placed in a 4:30 time slot for evaluation with me. He should be seen relatively soon given this is an ongoing issue. Thanks.

## 2017-09-22 NOTE — Telephone Encounter (Signed)
Called and spoke with pt's daughter. Pt has been scheduled for an appt with Lauren Guse.

## 2017-10-02 ENCOUNTER — Encounter: Payer: Self-pay | Admitting: Family Medicine

## 2017-10-02 ENCOUNTER — Ambulatory Visit (INDEPENDENT_AMBULATORY_CARE_PROVIDER_SITE_OTHER): Payer: Medicare HMO | Admitting: Family Medicine

## 2017-10-02 VITALS — BP 110/68 | HR 50 | Temp 98.1°F | Ht 72.0 in | Wt 232.0 lb

## 2017-10-02 DIAGNOSIS — F419 Anxiety disorder, unspecified: Secondary | ICD-10-CM

## 2017-10-02 DIAGNOSIS — R42 Dizziness and giddiness: Secondary | ICD-10-CM | POA: Diagnosis not present

## 2017-10-02 DIAGNOSIS — H81399 Other peripheral vertigo, unspecified ear: Secondary | ICD-10-CM

## 2017-10-02 DIAGNOSIS — R69 Illness, unspecified: Secondary | ICD-10-CM | POA: Diagnosis not present

## 2017-10-02 MED ORDER — ALPRAZOLAM 0.25 MG PO TABS: 0.2500 mg | ORAL_TABLET | Freq: Two times a day (BID) | ORAL | 0 refills | Status: DC | PRN

## 2017-10-02 MED ORDER — MECLIZINE HCL 12.5 MG PO TABS: 12.5000 mg | ORAL_TABLET | Freq: Three times a day (TID) | ORAL | 2 refills | Status: DC | PRN

## 2017-10-02 NOTE — Progress Notes (Signed)
Subjective:    Patient ID: Chase Simmons, male    DOB: 12/21/1933, 84 y.o.   MRN: [id number]  HPI   Presents to clinic c/o vertigo symptoms for over 5 years off and one, but seem to happening more often the past 2 weeks. He had been taking meclizine PRN, but ran out of medication.   States he will have attacks of dizziness when going from sit to stand or from laying to sitting, at times can make him feel nausea he gets so dizzy.   Had this back in Jan 2019 also , was seen at ER and CT brain done showing no acute changes.   CT brain 01/07/2017 CLINICAL DATA:  Dizziness and vertigo.  EXAM: CT HEAD WITHOUT CONTRAST TECHNIQUE: Contiguous axial images were obtained from the base of the skull through the vertex without intravenous contrast.  COMPARISON:  08/23/2016  FINDINGS: Brain: There is no evidence for acute hemorrhage, hydrocephalus, mass lesion, or abnormal extra-axial fluid collection. No definite CT evidence for acute infarction. Diffuse loss of parenchymal volume is consistent with atrophy. Patchy low attenuation in the deep hemispheric and periventricular white matter is nonspecific, but likely reflects chronic microvascular ischemic demyelination.  Vascular: No hyperdense vessel or unexpected calcification.  Skull: No evidence for fracture. No worrisome lytic or sclerotic lesion.  Sinuses/Orbits: Visualized portions of the globes and intraorbital fat are unremarkable. The visualized paranasal sinuses and mastoid air cells are clear.  Other: None. IMPRESSION: 1. Stable exam.  No acute intracranial abnormality. 2. Atrophy with chronic small vessel white matter ischemic disease.  He was seen in clinic here by Dr Caryl Bis in June 2019 and reported no further vertigo symptoms at that time.   Sees neurology due to tremor  Requests xanax refill   Patient Active Problem List   Diagnosis Date Noted  . Bilateral leg edema 11/08/2016  . Dizziness  08/25/2016  . Nausea 08/01/2016  . CHF (congestive heart failure) ([place]) 08/01/2016  . Atypical chest pain 07/20/2016  . COPD (chronic obstructive pulmonary disease) ([place]) 03/15/2016  . Coronary atherosclerosis of native coronary artery 01/05/2016  . Aortic atherosclerosis ([place]) 01/05/2016  . Bradycardia 01/05/2016  . Anxiety 06/17/2015  . Malignant neoplasm of urinary bladder ([place]) 06/17/2015  . Allergic rhinitis 05/14/2015  . Knee pain, bilateral 03/16/2015  . PAF (paroxysmal atrial fibrillation) ([place]) 08/30/2013  . Acid reflux 08/30/2013  . Tremor, essential 10/22/2012  . Hyperlipidemia 10/22/2012  . HTN (hypertension) 08/01/2012  . Type 2 diabetes mellitus with complication, without long-term current use of insulin ([place]) 08/01/2012  . Benign prostatic hyperplasia with urinary obstruction 09/02/2011   Social History   Tobacco Use  . Smoking status: Former Smoker    Packs/day: 1.50    Years: 40.00    Pack years: 60.00  . Smokeless tobacco: Never Used  Substance Use Topics  . Alcohol use: Yes    Alcohol/week: 12.0 standard drinks    Types: 12 Standard drinks or equivalent per week    Frequency: Never    Comment: 1 drink with dinner   Review of Systems  Constitutional: Negative for chills, fatigue and fever.  HENT: Negative for congestion, ear pain, sinus pain and sore throat.   Eyes: Negative.   Respiratory: Negative for cough, shortness of breath and wheezing.   Cardiovascular: Negative for chest pain, palpitations and leg swelling.  Gastrointestinal: Negative for abdominal pain, diarrhea, nausea and vomiting.  Genitourinary: Negative for dysuria, frequency and urgency.  Musculoskeletal: Negative for arthralgias and myalgias.  Skin: Negative for color change, pallor and rash.  Neurological: Negative for syncope. +dizziness/vertigo Psychiatric/Behavioral: The patient is not nervous/anxious.       Objective:   Physical Exam  Constitutional: He is oriented to person,  place, and time. No distress.  Chronically ill Elderly man, in wheelchair.   HENT:  Head: Normocephalic and atraumatic.  Right Ear: External ear normal.  Left Ear: External ear normal.  Hard of hearing. Fullness bilateral TMs  Eyes: Pupils are equal, round, and reactive to light. Conjunctivae and EOM are normal. Right eye exhibits no discharge. Left eye exhibits no discharge. No scleral icterus.  Neck: Neck supple. No tracheal deviation present.  Cardiovascular: Regular rhythm.  HR 50  Pulmonary/Chest: Effort normal and breath sounds normal. No respiratory distress.  Musculoskeletal: He exhibits no edema.  Neurological: He is alert and oriented to person, place, and time.  +essential tremor (chronic, sees neurology)  Skin: Skin is warm and dry. Capillary refill takes less than 2 seconds. No pallor.  Psychiatric: He has a normal mood and affect. His behavior is normal.  Nursing note and vitals reviewed.   Vitals:   10/02/17 1653  BP: 110/68  Pulse: (!) 50  Temp: 98.1 F (36.7 C)  SpO2: 98%       Assessment & Plan:    Vertigo -- patient will use meclizine as needed for episodes of dizziness.  Advised to be sure to keep self well-hydrated and change positions slowly.  We will get new CT scan of brain for comparison to CT scan done in January 2019.  We will also put in referral for vestibular physical therapy to help patient work on balance.  Advised that if dizziness becomes worse, he develops severe headache, nausea, vomiting, one-sided weakness, slurred speech, vision changes that these are emergency symptoms and he needs to go to emergency room right away.  Essential tremor -- Chronic, sees neurology. On primidone.  Anxiety -- Refill of xanax 0.25mg  given. Uses as needed for anxiety and to help sleep. PMP narcotic registry checked and is OK to refill.   Keep regularly scheduled follow-up as planned in December 2019.  Return to clinic sooner if issues arise.

## 2017-10-03 ENCOUNTER — Other Ambulatory Visit: Payer: Self-pay | Admitting: *Deleted

## 2017-10-03 NOTE — Patient Outreach (Signed)
[facility]) Care Management  10/03/2017  FELTON BUCZYNSKI 1933-06-08 [id number]  RN Health Coach attempted  Telephone outreach call to patient.  No voice mail pick up. RN Health Coach will check to see if the patient is still involved with care connection program.  Plan: RN will call patient again within 10 business days.  [place] Care Management [phone]

## 2017-10-17 ENCOUNTER — Ambulatory Visit: Payer: Self-pay | Admitting: *Deleted

## 2017-10-24 ENCOUNTER — Encounter: Payer: Self-pay | Admitting: Family Medicine

## 2017-10-24 ENCOUNTER — Ambulatory Visit (INDEPENDENT_AMBULATORY_CARE_PROVIDER_SITE_OTHER): Payer: Medicare HMO | Admitting: Family Medicine

## 2017-10-24 VITALS — BP 161/64 | HR 52 | Ht 72.0 in

## 2017-10-24 DIAGNOSIS — R300 Dysuria: Secondary | ICD-10-CM

## 2017-10-24 NOTE — Progress Notes (Signed)
Patient presents today with dysuria and urinary frequency. A urine was collected for UA, UCX. Patient states he has not been on abx or had any Urological surgeries in the last 30 days. After reviewing the UA no ABX was sent to pharmacy today. We will wait for UCX results.

## 2017-10-24 NOTE — Addendum Note (Signed)
Addended by: Kyra Manges on: 10/24/2017 04:04 PM   Modules accepted: Level of Service

## 2017-10-25 LAB — URINALYSIS, COMPLETE
BILIRUBIN UA: NEGATIVE
KETONES UA: NEGATIVE
LEUKOCYTES UA: NEGATIVE
Nitrite, UA: NEGATIVE
PH UA: 5 (ref 5.0–7.5)
Protein, UA: NEGATIVE
RBC, UA: NEGATIVE
Specific Gravity, UA: 1.015 (ref 1.005–1.030)
Urobilinogen, Ur: 0.2 mg/dL (ref 0.2–1.0)

## 2017-10-25 LAB — MICROSCOPIC EXAMINATION
RBC MICROSCOPIC, UA: NONE SEEN /HPF (ref 0–2)
WBC, UA: NONE SEEN /hpf (ref 0–5)

## 2017-10-27 LAB — CULTURE, URINE COMPREHENSIVE

## 2017-10-30 ENCOUNTER — Telehealth: Payer: Self-pay

## 2017-10-30 NOTE — Telephone Encounter (Signed)
-----   Message from Nori Riis, PA-C sent at 10/30/2017  8:13 AM EDT ----- Please let Chase Simmons know that his urine culture was negative.  If he is still having symptoms, he needs to see a provider.

## 2017-10-30 NOTE — Telephone Encounter (Signed)
Called pt informed him of the information below. Pt gave verbal understanding.  

## 2017-12-14 ENCOUNTER — Telehealth: Payer: Self-pay | Admitting: Family Medicine

## 2017-12-14 DIAGNOSIS — D649 Anemia, unspecified: Secondary | ICD-10-CM

## 2017-12-14 NOTE — Telephone Encounter (Signed)
I wanted to refer this patient to hematology previously for evaluation of anemia and enlarged red blood cells. It looks like the [facility] may not have gotten an answer from the patient regarding the referral. Please call the patient to see if he is willing to see hematology. Thanks.

## 2017-12-15 ENCOUNTER — Ambulatory Visit: Payer: Medicare HMO | Admitting: Family Medicine

## 2017-12-21 NOTE — Telephone Encounter (Signed)
Called patient and left a VM to call back. CRM created and sent to [facility] pool.  

## 2017-12-22 NOTE — Telephone Encounter (Signed)
Called [name] and left a VM to call back. CRM created and sent to [facility] pool.

## 2017-12-22 NOTE — Telephone Encounter (Signed)
Pt's son [name]) returning [facility] call.  Please call [name]: [phone]

## 2017-12-25 NOTE — Telephone Encounter (Signed)
Called [name] and left a VM to call back CRM created and sent to [facility] pool.

## 2017-12-26 ENCOUNTER — Ambulatory Visit: Payer: Medicare HMO

## 2017-12-28 ENCOUNTER — Ambulatory Visit (INDEPENDENT_AMBULATORY_CARE_PROVIDER_SITE_OTHER): Payer: Medicare HMO

## 2017-12-28 DIAGNOSIS — Z23 Encounter for immunization: Secondary | ICD-10-CM | POA: Diagnosis not present

## 2018-01-02 NOTE — Addendum Note (Signed)
Addended by: Leone Haven on: 01/02/2018 10:22 AM   Modules accepted: Orders

## 2018-01-02 NOTE — Telephone Encounter (Signed)
Referral placed.

## 2018-01-02 NOTE — Telephone Encounter (Signed)
Called [name] and left a VM that referral has been placed and he should be getting a call in 1-2 weeks to be set up for an appt for mr. Appenzeller.

## 2018-01-02 NOTE — Telephone Encounter (Signed)
Called and spoke with Chase Simmons. Chase Simmons stated that you can placed the referral. Sent to PCP to place referral.

## 2018-01-08 ENCOUNTER — Ambulatory Visit (INDEPENDENT_AMBULATORY_CARE_PROVIDER_SITE_OTHER): Payer: Medicare HMO | Admitting: Family Medicine

## 2018-01-08 ENCOUNTER — Encounter: Payer: Self-pay | Admitting: Family Medicine

## 2018-01-08 VITALS — BP 142/68 | HR 58 | Temp 98.1°F

## 2018-01-08 DIAGNOSIS — R42 Dizziness and giddiness: Secondary | ICD-10-CM | POA: Diagnosis not present

## 2018-01-08 DIAGNOSIS — G250 Essential tremor: Secondary | ICD-10-CM

## 2018-01-08 DIAGNOSIS — R69 Illness, unspecified: Secondary | ICD-10-CM | POA: Diagnosis not present

## 2018-01-08 DIAGNOSIS — F419 Anxiety disorder, unspecified: Secondary | ICD-10-CM

## 2018-01-08 MED ORDER — PAROXETINE HCL 40 MG PO TABS: 40.0000 mg | ORAL_TABLET | Freq: Every day | ORAL | 1 refills | Status: DC

## 2018-01-08 MED ORDER — PRIMIDONE 50 MG PO TABS: 75.0000 mg | ORAL_TABLET | Freq: Two times a day (BID) | ORAL | 1 refills | Status: DC

## 2018-01-08 MED ORDER — MECLIZINE HCL 12.5 MG PO TABS: 12.5000 mg | ORAL_TABLET | Freq: Three times a day (TID) | ORAL | 2 refills | Status: DC | PRN

## 2018-01-08 MED ORDER — ALPRAZOLAM 0.25 MG PO TABS: 0.2500 mg | ORAL_TABLET | Freq: Two times a day (BID) | ORAL | 0 refills | Status: DC | PRN

## 2018-01-08 NOTE — Progress Notes (Signed)
Subjective:    Patient ID: Chase Simmons, male    DOB: [birth year], 85 y.o.   MRN: [id number]  HPI   Presents to clinic for medication refill on meclizine.  Uses this as needed for dizziness.  Patient does not walk very often anymore, usually gets around in his wheelchair, or use a walker if he does walk short distance.  Dizziness will occur mainly from changing position from lying to standing.  Usually it will resolve on own if he sits still, but there are times where he does take meclizine.  He was seen for similar symptoms in September 2019, repeat CT scan of brain to further evaluate dizziness was ordered at that time, patient has not yet had this done.  Vestibular rehab was also ordered, but patient has not gone.  Denies any loss of visual field, speech difficulty, one-sided weakness.  Patient does have a known tremor.  He is on primidone for this.  He does request refill for primidone today as well.  Mood is stable on Paxil.  Patient requires refill of Paxil today as well.  Denies any SI or HI.  Family member also requesting refill of Xanax, he uses it sparingly.  Last refill was in September 2019 for 20 tablets.  Patient Active Problem List   Diagnosis Date Noted  . Bilateral leg edema 11/08/2016  . Vertigo 08/25/2016  . Nausea 08/01/2016  . CHF (congestive heart failure) ([place]) 08/01/2016  . Atypical chest pain 07/20/2016  . COPD (chronic obstructive pulmonary disease) ([place]) 03/15/2016  . Coronary atherosclerosis of native coronary artery 01/05/2016  . Aortic atherosclerosis ([place]) 01/05/2016  . Bradycardia 01/05/2016  . Anxiety 06/17/2015  . Malignant neoplasm of urinary bladder ([place]) 06/17/2015  . Allergic rhinitis 05/14/2015  . Knee pain, bilateral 03/16/2015  . PAF (paroxysmal atrial fibrillation) ([place]) 08/30/2013  . Acid reflux 08/30/2013  . Tremor, essential 10/22/2012  . Hyperlipidemia 10/22/2012  . HTN (hypertension) 08/01/2012  . Type 2 diabetes mellitus with  complication, without long-term current use of insulin ([place]) 08/01/2012  . Benign prostatic hyperplasia with urinary obstruction 09/02/2011   Social History   Tobacco Use  . Smoking status: Former Smoker    Packs/day: 1.50    Years: 40.00    Pack years: 60.00  . Smokeless tobacco: Never Used  Substance Use Topics  . Alcohol use: Yes    Alcohol/week: 12.0 standard drinks    Types: 12 Standard drinks or equivalent per week    Frequency: Never    Comment: 1 drink with dinner   Review of Systems  Constitutional: Negative for chills, fatigue and fever.  HENT: Negative for congestion, ear pain, sinus pain and sore throat.   Eyes: Negative.   Respiratory: Negative for cough, shortness of breath and wheezing.   Cardiovascular: Negative for chest pain, palpitations and leg swelling.  Gastrointestinal: Negative for abdominal pain, diarrhea, nausea and vomiting.  Genitourinary: Negative for dysuria, frequency and urgency.  Musculoskeletal: Negative for arthralgias and myalgias.  Skin: Negative for color change, pallor and rash.  Neurological: Negative for syncope, headaches. +dizziness off and on Psychiatric/Behavioral: The patient is not nervous/anxious.       Objective:   Physical Exam   Constitutional: He is oriented to person, place, and time. No distress.  Chronically ill Elderly man, in wheelchair.   HENT:  Head: Normocephalic and atraumatic.  Right Ear: External ear normal.  Left Ear: External ear normal.  Hard of hearing. Fullness bilateral TMs  Eyes: Pupils are equal,  round, and reactive to light. Conjunctivae and EOM are normal. Right eye exhibits no discharge. Left eye exhibits no discharge. No scleral icterus.  Neck: Neck supple. No tracheal deviation present.  Cardiovascular: Regular rhythm.  HR 58  Pulmonary/Chest: Effort normal and breath sounds normal. No respiratory distress.  Musculoskeletal: He exhibits no edema.  Neurological: He is alert and oriented to  person, place, and time.  +essential tremor (chronic, sees neurology)  Skin: Skin is warm and dry. Capillary refill takes less than 2 seconds. No pallor.  Psychiatric: He has a normal mood and affect.   Nursing note and vitals reviewed.     Vitals:   01/08/18 1130  BP: (!) 142/68  Pulse: (!) 58  Temp: 98.1 F (36.7 C)  SpO2: 94%   Assessment & Plan:   Dizziness/vertigo-patient given refill of meclizine to use if needed.  Encouraged to get CT scan as ordered, patient will member aware that someone will be contacting them in regard to setting up CT scan appointment.  Patient and family are unsure if they will be able to get patient to 1 from vestibular rehab, but they will think about it.  Anxiety- Paxil refilled.  He is stable on this medicine.  Patient given 20 tablets supply of Xanax to use if needed in times of breakthrough anxiety.  Essential tremor-primidone refilled.  Tremor is at baseline.  Encouraged regular follow-up with neurology.  Offered lab work today to follow-up on multiple chronic medical conditions, patient feeling better both declined at this time.  Advised to make follow-up appointment with PCP for management of chronic medical issues.

## 2018-01-08 NOTE — Patient Instructions (Signed)
Some one will call to get the CT scan scheduled

## 2018-01-10 ENCOUNTER — Encounter: Payer: Self-pay | Admitting: Family Medicine

## 2018-01-24 ENCOUNTER — Inpatient Hospital Stay: Payer: Medicare HMO | Admitting: Oncology

## 2018-01-26 ENCOUNTER — Ambulatory Visit: Payer: Medicare HMO | Attending: Family Medicine

## 2018-01-28 ENCOUNTER — Emergency Department: Payer: Medicare HMO

## 2018-01-28 ENCOUNTER — Other Ambulatory Visit: Payer: Self-pay

## 2018-01-28 ENCOUNTER — Emergency Department
Admission: EM | Admit: 2018-01-28 | Discharge: 2018-01-28 | Disposition: A | Discharge status: Home / Self Care | Payer: Medicare HMO | Attending: Emergency Medicine | Admitting: Emergency Medicine

## 2018-01-28 DIAGNOSIS — Z87891 Personal history of nicotine dependence: Secondary | ICD-10-CM | POA: Diagnosis not present

## 2018-01-28 DIAGNOSIS — Z79899 Other long term (current) drug therapy: Secondary | ICD-10-CM | POA: Insufficient documentation

## 2018-01-28 DIAGNOSIS — R42 Dizziness and giddiness: Secondary | ICD-10-CM

## 2018-01-28 DIAGNOSIS — R5381 Other malaise: Secondary | ICD-10-CM | POA: Diagnosis not present

## 2018-01-28 DIAGNOSIS — I110 Hypertensive heart disease with heart failure: Secondary | ICD-10-CM | POA: Diagnosis not present

## 2018-01-28 DIAGNOSIS — E785 Hyperlipidemia, unspecified: Secondary | ICD-10-CM | POA: Diagnosis not present

## 2018-01-28 DIAGNOSIS — J449 Chronic obstructive pulmonary disease, unspecified: Secondary | ICD-10-CM | POA: Diagnosis not present

## 2018-01-28 DIAGNOSIS — I10 Essential (primary) hypertension: Secondary | ICD-10-CM | POA: Diagnosis not present

## 2018-01-28 DIAGNOSIS — I509 Heart failure, unspecified: Secondary | ICD-10-CM | POA: Insufficient documentation

## 2018-01-28 DIAGNOSIS — R001 Bradycardia, unspecified: Secondary | ICD-10-CM | POA: Diagnosis not present

## 2018-01-28 LAB — BASIC METABOLIC PANEL
Anion gap: 5 (ref 5–15)
BUN: 25 mg/dL — ABNORMAL HIGH (ref 8–23)
CHLORIDE: 104 mmol/L (ref 98–111)
CO2: 29 mmol/L (ref 22–32)
Calcium: 9 mg/dL (ref 8.9–10.3)
Creatinine, Ser: 1.07 mg/dL (ref 0.61–1.24)
GFR calc Af Amer: >60 mL/min (ref >60)
GFR calc non Af Amer: >60 mL/min (ref >60)
Glucose, Bld: 130 mg/dL — ABNORMAL HIGH (ref 70–99)
Potassium: 4.2 mmol/L (ref 3.5–5.1)
SODIUM: 138 mmol/L (ref 135–145)

## 2018-01-28 LAB — HEPATIC FUNCTION PANEL
ALK PHOS: 76 U/L (ref 38–126)
ALT: 28 U/L (ref 0–44)
AST: 25 U/L (ref 15–41)
Albumin: 3.7 g/dL (ref 3.5–5.0)
Bilirubin, Direct: 0.2 mg/dL (ref 0.0–0.2)
Indirect Bilirubin: 0.5 mg/dL (ref 0.3–0.9)
Total Bilirubin: 0.7 mg/dL (ref 0.3–1.2)
Total Protein: 6.4 g/dL — ABNORMAL LOW (ref 6.5–8.1)

## 2018-01-28 LAB — CBC
HCT: 37.9 % — ABNORMAL LOW (ref 39.0–52.0)
Hemoglobin: 12.6 g/dL — ABNORMAL LOW (ref 13.0–17.0)
MCH: 33.7 pg (ref 26.0–34.0)
MCHC: 33.2 g/dL (ref 30.0–36.0)
MCV: 101.3 fL — ABNORMAL HIGH (ref 80.0–100.0)
Platelets: 173 10*3/uL (ref 150–400)
RBC: 3.74 MIL/uL — ABNORMAL LOW (ref 4.22–5.81)
RDW: 12.2 % (ref 11.5–15.5)
WBC: 7.8 10*3/uL (ref 4.0–10.5)
nRBC: 0 % (ref 0.0–0.2)

## 2018-01-28 LAB — URINALYSIS, COMPLETE (UACMP) WITH MICROSCOPIC
BACTERIA UA: NONE SEEN
Bilirubin Urine: NEGATIVE
Glucose, UA: NEGATIVE mg/dL
Hgb urine dipstick: NEGATIVE
Ketones, ur: 5 mg/dL — AB
Leukocytes, UA: NEGATIVE
Nitrite: NEGATIVE
PH: 8 (ref 5.0–8.0)
Protein, ur: NEGATIVE mg/dL
Specific Gravity, Urine: 1.015 (ref 1.005–1.030)

## 2018-01-28 LAB — TROPONIN I: Troponin I: <0.03 ng/mL (ref <0.03)

## 2018-01-28 MED ORDER — SODIUM CHLORIDE 0.9% FLUSH: 3.0000 mL | Freq: Once | INTRAVENOUS | Status: DC

## 2018-01-28 MED ORDER — LORAZEPAM 2 MG/ML IJ SOLN
0.5000 mg | Freq: Once | INTRAMUSCULAR | Status: AC
  Administered 2018-01-28: 0.5 mg via INTRAVENOUS
  Filled 2018-01-28: qty 1

## 2018-01-28 NOTE — ED Triage Notes (Signed)
Pt presents via EMS from [facility] c/o dizziness. EMS report pt hypertensive.

## 2018-01-28 NOTE — ED Provider Notes (Signed)
-----------------------------------------   11:08 PM on 01/28/2018 -----------------------------------------   Blood pressure (!) 172/89, pulse (!) 57, temperature 98.1 F (36.7 C), resp. rate 18, height 6' (1.829 m), weight 104.3 kg, SpO2 96 %.  Assuming care from Dr. Cinda Quest of Chase Simmons is a 85 y.o. male with a chief complaint of Dizziness .    Please refer to H&P by previous MD for further details.  The current plan of care is to f/u head CT and troponin and if negative dc home to care of daughters.   I have personally reviewed the images performed during this visit and I agree with the Radiologist's read.   Interpretation by Radiologist:  Ct Head Wo Contrast  Result Date: 01/28/2018 CLINICAL DATA:  Dizziness EXAM: CT HEAD WITHOUT CONTRAST TECHNIQUE: Contiguous axial images were obtained from the base of the skull through the vertex without intravenous contrast. COMPARISON:  CT brain 01/07/2017 FINDINGS: Brain: No acute territorial infarction, hemorrhage or intracranial mass. Moderate-to-marked atrophy with advanced small vessel ischemic changes of the white matter. Stable prominent ventricles. Vascular: No hyperdense vessels.  No unexpected calcification Skull: Normal. Negative for fracture or focal lesion. Sinuses/Orbits: No acute finding. Other: None IMPRESSION: 1. No CT evidence for acute intracranial abnormality. 2. Atrophy with small vessel ischemic changes of the white matter Electronically Signed   By: Donavan Foil M.D.   On: 01/28/2018 22:38     CT negative. Troponin negative. Labs WNL. Patient remains at baseline.  Will discharge home with instructions provided by Dr. Cinda Quest to the care of daughters.      [name], MD 01/28/18 [phone]

## 2018-01-28 NOTE — ED Provider Notes (Addendum)
[facility] Emergency Department Provider Note   ____________________________________________   First MD Initiated Contact with Patient 01/28/18 1859     (approximate)  I have reviewed the triage vital signs and the nursing notes.   HISTORY  Chief Complaint Dizziness    HPI Chase Simmons is a 85 y.o. male patient reports he got dizzy apparently lightheaded with standing.  This happens again in the emergency room.  It happened at home and the "[facility] folks home" today.  He reports he had his blood pressure taken several times there and it was apparently abnormal" cannot quite tell if it was too high or too low there.  Here it is high.  Patient denies any headache nausea vomiting chest pain or any other problems. Family comes in reports patient has been having vertigo-like symptoms in the past.  His primary care provider wanted some sort of head imaging done.  Past Medical History:  Diagnosis Date  . Allergy   . Bladder cancer ([place])    a. followed by Dr. Jacqlyn Larsen  . Chronic combined systolic (congestive) and diastolic (congestive) heart failure ([place])    a. 07/2016 Echo: EF 30-35%, sev mid-apicalanteroseptal, ant, inf HK, Gr1 DD.  Marland Kitchen COPD (chronic obstructive pulmonary disease) ([place])   . Hyperlipidemia   . Hypertension   . NICM (nonischemic cardiomyopathy) ([place])    a. 07/2016 Echo: EF 30-35%, sev mid-apicalanteroseptal, ant, inf HK, Gr1 DD, mild MR, mildly dil LA, PASP 63mHg - ? stress induced CM.  .Marland KitchenNonobstructive Coronary atherosclerosis    a. 07/2016 Cath: LM nl, LAD nl, D1 40ost, LCX 249mRCA nl, EF 25-30%.  . Marland KitchenAF (paroxysmal atrial fibrillation) ([place] [place]   a. Dx 08/2013. CHA2DS2VASc = 6-->no OAC 2/2 h/o falls and nocompliance.  . Tremor, essential    sees [facility] neurologist  . Type 2 diabetes mellitus with complication, without long-term current use of insulin ([facility]7/30/2014  . Urinary incontinence     Patient Active Problem List   Diagnosis Date Noted  .  Bilateral leg edema 11/08/2016  . Vertigo 08/25/2016  . Nausea 08/01/2016  . CHF (congestive heart failure) ([facility]07/30/2018  . Atypical chest pain 07/20/2016  . COPD (chronic obstructive pulmonary disease) ([facility]03/13/2018  . Coronary atherosclerosis of native coronary artery 01/05/2016  . Aortic atherosclerosis ([facility]01/02/2016  . Bradycardia 01/05/2016  . Anxiety 06/17/2015  . Malignant neoplasm of urinary bladder ([facility]06/14/2017  . Allergic rhinitis 05/14/2015  . Knee pain, bilateral 03/16/2015  . PAF (paroxysmal atrial fibrillation) ([facility]08/28/2015  . Acid reflux 08/30/2013  . Tremor, essential 10/22/2012  . Hyperlipidemia 10/22/2012  . HTN (hypertension) 08/01/2012  . Type 2 diabetes mellitus with complication, without long-term current use of insulin ([facility]07/30/2014  . Benign prostatic hyperplasia with urinary obstruction 09/02/2011    Past Surgical History:  Procedure Laterality Date  . bladder cancer    . CATARACT EXTRACTION    . HERNIA REPAIR    . LEFT HEART CATH AND CORONARY ANGIOGRAPHY N/A 07/11/2016   Procedure: Left Heart Cath and Coronary Angiography;  Surgeon: ArWellington HampshireMD;  [facility];  Service: Cardiovascular;  Laterality: N/A;    Prior to Admission medications   Medication Sig Start Date End Date Taking? Authorizing Provider  acetaminophen (TYLENOL) 325 MG tablet Take 2 tablets (650 mg total) by mouth every 6 (six) hours as needed for mild pain (or Fever >/= 101). 06/22/15   GoNicholes MangoMD  ALPRAZolam (XDuanne Moron0.25  MG tablet Take 1 tablet (0.25 mg total) by mouth 2 (two) times daily as needed for anxiety or sleep. for anxiety 01/08/18   Jodelle Green, FNP  atorvastatin (LIPITOR) 80 MG tablet TAKE 1 TABLET ONE TIME DAILY 06/15/17   Leone Haven, MD  blood glucose meter kit and supplies KIT Dispense based on patient and insurance preference. Use up to four times daily as directed. 08/01/16   Coral Spikes, DO  fexofenadine  (ALLEGRA) 180 MG tablet Take 180 mg by mouth daily as needed for allergies or rhinitis.    [provider]  glimepiride (AMARYL) 4 MG tablet take 1 tablet by mouth WITH BREAKFAST. 06/15/17   Leone Haven, MD  lisinopril (PRINIVIL,ZESTRIL) 20 MG tablet Take 1 tablet (20 mg total) by mouth daily. 06/15/17   Leone Haven, MD  meclizine (ANTIVERT) 12.5 MG tablet Take 1 tablet (12.5 mg total) by mouth 3 (three) times daily as needed for dizziness. 01/08/18   Jodelle Green, FNP  metFORMIN (GLUCOPHAGE) 1000 MG tablet Take 1 tablet (1,000 mg total) by mouth 2 (two) times daily with a meal. 06/15/17   Leone Haven, MD  metFORMIN (GLUCOPHAGE) 500 MG tablet TAKE 1 TABLET BY MOUTH TWICE A DAY 07/20/17   Leone Haven, MD  nystatin-triamcinolone ointment [facility]) Apply 1 application topically 2 (two) times daily. 09/14/17   Hollice Espy, MD  pantoprazole (PROTONIX) 40 MG tablet Take 1 tablet (40 mg total) by mouth daily. 06/15/17   Leone Haven, MD  PARoxetine (PAXIL) 40 MG tablet Take 1 tablet (40 mg total) by mouth daily. 01/08/18   Guse, Jacquelynn Cree, FNP  primidone (MYSOLINE) 50 MG tablet Take 1.5 tablets (75 mg total) by mouth 2 (two) times daily. 01/08/18   Jodelle Green, FNP  tiotropium (SPIRIVA) 18 MCG inhalation capsule Place 18 mcg as needed into inhaler and inhale.     [provider]    Allergies Latex  Family History  Problem Relation Age of Onset  . Arthritis Mother   . Dementia Mother   . Cancer Maternal Uncle        prostate  . Cancer Maternal Aunt        breast cancer    Social History Social History   Tobacco Use  . Smoking status: Former Smoker    Packs/day: 1.50    Years: 40.00    Pack years: 60.00  . Smokeless tobacco: Never Used  Substance Use Topics  . Alcohol use: Yes    Alcohol/week: 12.0 standard drinks    Types: 12 Standard drinks or equivalent per week    Frequency: Never    Comment: 1 drink with dinner  . Drug use: No     Review of Systems  Constitutional: No fever/chills Eyes: No visual changes. ENT: No sore throat. Cardiovascular: Denies chest pain. Respiratory: Denies shortness of breath. Gastrointestinal: No abdominal pain.  No nausea, no vomiting.  No diarrhea.  No constipation. Genitourinary: Negative for dysuria. Musculoskeletal: Negative for back pain. Skin: Negative for rash. Neurological: Negative for headaches, focal weakness  ____________________________________________   PHYSICAL EXAM:  VITAL SIGNS: ED Triage Vitals  Enc Vitals Group     BP 01/28/18 1814 (!) 166/113     Pulse Rate 01/28/18 1814 (!) 111     Resp 01/28/18 1814 14     Temp 01/28/18 1814 98.1 F (36.7 C)     Temp src --      SpO2 01/28/18 1814 98 %  Weight 01/28/18 1815 242 lb (109.8 kg)     Height --      Head Circumference --      Peak Flow --      Pain Score 01/28/18 1814 0     Pain Loc --      Pain Edu? --      Excl. in [place]? --     Constitutional: Alert and oriented. Well appearing and in no acute distress. Eyes: Conjunctivae are normal. PERRL. EOMI. fundi look normal I do not see any nystagmus Head: Atraumatic. Nose: No congestion/rhinnorhea. Mouth/Throat: Mucous membranes are moist.  Oropharynx non-erythematous. Neck: No stridor.   Cardiovascular: Normal rate, regular rhythm. Grossly normal heart sounds.  Good peripheral circulation. Respiratory: Normal respiratory effort.  No retractions. Lungs CTAB. Gastrointestinal: Soft and nontender. No distention. No abdominal bruits. No CVA tenderness. Musculoskeletal: No lower extremity tenderness nor edema. Neurologic:  Normal speech and language. No gross focal neurologic deficits are appreciated.  Cranial nerves II through XII appear to be intact of the visual fields were not checked motor strength is 5/5 throughout finger-nose is normal. Skin:  Skin is warm, dry and intact. No rash noted. Psychiatric: Mood and affect are normal. Speech and behavior  are normal.  ____________________________________________   LABS (all labs ordered are listed, but only abnormal results are displayed)  Labs Reviewed  BASIC METABOLIC PANEL - Abnormal; Notable for the following components:      Result Value   Glucose, Bld 130 (*)    BUN 25 (*)    All other components within normal limits  CBC - Abnormal; Notable for the following components:   RBC 3.74 (*)    Hemoglobin 12.6 (*)    HCT 37.9 (*)    MCV 101.3 (*)    All other components within normal limits  URINALYSIS, COMPLETE (UACMP) WITH MICROSCOPIC  CBG MONITORING, ED   ____________________________________________  EKG  _KG read and interpreted by me shows sinus bradycardia rate of 52 nonspecific intraventricular conduction delay with a QRS duration of 134 ms nonspecific EKG changes PVCs.  Of note is the fact that the monitor does not look anything like this I will repeat the EKG and see what we have.  ___________________________________________  RADIOLOGY  ED MD interpretation:   Official radiology report(s): No results found.  ____________________________________________   PROCEDURES  Procedure(s) performed:   Procedures  Critical Care performed:   ____________________________________________   INITIAL IMPRESSION / ASSESSMENT AND PLAN / ED COURSE  Patient's primary care reportedly wants to had some head imaging.  She absolutely refuses an MRI.  He says he cannot stand it.  He says he is never had one even though the chart says he had one in 2017.  He did have a try have a head MRI since then which she could not stand and even with sedation had to stop the attempt.  He does not want to try it again I will get a head CT.  I will have him follow-up with cardiology if head CT is negative and his primary care doctor.Dr Alfred Levins will follow up on the results    ----------------------------------------- 10:16 PM on  01/28/2018 -----------------------------------------  Daughters are now saying that they cannot take the patient home the way he is.  They think he is confused.  He is not any different than he has been doing his whole visit here but he does occasionally does not make things very clear.  Hospitalist is aware of this.  We will see  what his other results come back looking like and go from there.      ____________________________________________   FINAL CLINICAL IMPRESSION(S) / ED DIAGNOSES  Final diagnoses:  Dizziness     ED Discharge Orders    None       Note:  This document was prepared using Dragon voice recognition software and may include unintentional dictation errors.    Nena Polio, MD 01/28/18 2209    Nena Polio, MD 01/28/18 2217

## 2018-01-28 NOTE — Discharge Instructions (Addendum)
Please follow-up with Dr. Chancy Milroy the cardiologist.  Give his office a call in the morning and arrange some follow-up.  He may go to help with the dizziness also follow-up with your primary care doctor as well.  I am not sure if the dizziness is vertigo or something different.  Everything that I been able to do here so far looks good.Marland Kitchen

## 2018-02-02 ENCOUNTER — Other Ambulatory Visit: Payer: Self-pay

## 2018-02-02 ENCOUNTER — Inpatient Hospital Stay: Payer: Medicare HMO | Attending: Oncology | Admitting: Oncology

## 2018-02-02 ENCOUNTER — Encounter: Payer: Self-pay | Admitting: Oncology

## 2018-02-02 ENCOUNTER — Inpatient Hospital Stay: Payer: Medicare HMO

## 2018-02-02 VITALS — BP 152/78 | HR 50 | Temp 96.9°F | Resp 18 | Wt 226.6 lb

## 2018-02-02 DIAGNOSIS — Z87891 Personal history of nicotine dependence: Secondary | ICD-10-CM | POA: Diagnosis not present

## 2018-02-02 DIAGNOSIS — Z79899 Other long term (current) drug therapy: Secondary | ICD-10-CM | POA: Diagnosis not present

## 2018-02-02 DIAGNOSIS — Z7984 Long term (current) use of oral hypoglycemic drugs: Secondary | ICD-10-CM | POA: Diagnosis not present

## 2018-02-02 DIAGNOSIS — G8929 Other chronic pain: Secondary | ICD-10-CM

## 2018-02-02 DIAGNOSIS — Z8551 Personal history of malignant neoplasm of bladder: Secondary | ICD-10-CM | POA: Insufficient documentation

## 2018-02-02 DIAGNOSIS — E119 Type 2 diabetes mellitus without complications: Secondary | ICD-10-CM | POA: Insufficient documentation

## 2018-02-02 DIAGNOSIS — D649 Anemia, unspecified: Secondary | ICD-10-CM

## 2018-02-02 DIAGNOSIS — D539 Nutritional anemia, unspecified: Secondary | ICD-10-CM | POA: Diagnosis not present

## 2018-02-02 DIAGNOSIS — M2550 Pain in unspecified joint: Secondary | ICD-10-CM | POA: Diagnosis not present

## 2018-02-02 LAB — CBC WITH DIFFERENTIAL/PLATELET
Abs Immature Granulocytes: 0.02 10*3/uL (ref 0.00–0.07)
Basophils Absolute: 0.1 10*3/uL (ref 0.0–0.1)
Basophils Relative: 1 %
Eosinophils Absolute: 0.3 10*3/uL (ref 0.0–0.5)
Eosinophils Relative: 4 %
HCT: 36.9 % — ABNORMAL LOW (ref 39.0–52.0)
Hemoglobin: 12.4 g/dL — ABNORMAL LOW (ref 13.0–17.0)
IMMATURE GRANULOCYTES: 0 %
Lymphocytes Relative: 25 %
Lymphs Abs: 1.7 10*3/uL (ref 0.7–4.0)
MCH: 34.4 pg — ABNORMAL HIGH (ref 26.0–34.0)
MCHC: 33.6 g/dL (ref 30.0–36.0)
MCV: 102.5 fL — ABNORMAL HIGH (ref 80.0–100.0)
Monocytes Absolute: 0.6 10*3/uL (ref 0.1–1.0)
Monocytes Relative: 9 %
NEUTROS ABS: 4.3 10*3/uL (ref 1.7–7.7)
NEUTROS PCT: 61 %
Platelets: 162 10*3/uL (ref 150–400)
RBC: 3.6 MIL/uL — ABNORMAL LOW (ref 4.22–5.81)
RDW: 12.3 % (ref 11.5–15.5)
WBC: 7 10*3/uL (ref 4.0–10.5)
nRBC: 0 % (ref 0.0–0.2)

## 2018-02-02 LAB — RETIC PANEL
Immature Retic Fract: 11.8 % (ref 2.3–15.9)
RBC.: 3.6 MIL/uL — ABNORMAL LOW (ref 4.22–5.81)
RETIC CT PCT: 1.9 % (ref 0.4–3.1)
Retic Count, Absolute: 69.5 10*3/uL (ref 19.0–186.0)
Reticulocyte Hemoglobin: 39.9 pg (ref >27.9)

## 2018-02-02 LAB — LACTATE DEHYDROGENASE: LDH: 114 U/L (ref 98–192)

## 2018-02-02 LAB — FOLATE: Folate: 24 ng/mL (ref >5.9)

## 2018-02-02 LAB — VITAMIN B12: Vitamin B-12: 799 pg/mL (ref 180–914)

## 2018-02-02 LAB — TSH: TSH: 0.935 u[IU]/mL (ref 0.350–4.500)

## 2018-02-02 LAB — TECHNOLOGIST SMEAR REVIEW

## 2018-02-02 NOTE — Progress Notes (Signed)
Patient here for initial visit. °

## 2018-02-03 NOTE — Progress Notes (Signed)
Hematology/Oncology Consult note [facility] Telephone:([phone] [phone]   Patient Care Team: Leone Haven, MD as PCP - General (Family Medicine) Pleasant, Eppie Gibson, RN as [place] Management  REFERRING PROVIDER:  CHIEF COMPLAINTS/REASON FOR VISIT:  Evaluation of anemia  HISTORY OF PRESENTING ILLNESS:  Chase Simmons is a  85 y.o.  male with PMH listed below who was referred to me for evaluation of anemia Reviewed patient's recent labs that was done at [facility] office. Labs revealed anemia with hemoglobin of 12.6, MCV 101.3 Reviewed patient's previous labs ordered by primary care physician's office, anemia is chronic onset , duration is since 2017. No aggravating or improving factors. Patient has hard hearing and is poor historian.  History mainly obtained from patient's daughter. Patient does not walk due to chronic joint pain Associated signs and symptoms: Patient reports fatigue.  SOB with exertion.  Denies weight loss, easy bruising, hematochezia, hemoptysis, hematuria. Context: History of GI bleeding: Denies               History of Chronic kidney disease denies     Denies fever, chills, abdominal pain, chest pain            History of bladder cancer, following up with urology.  Per daughter, patient has had a cystoscopy done with normal findings. Does not eat very well per daughter. .Review of Systems  Constitutional: Positive for fatigue. Negative for appetite change, chills, fever and unexpected weight change.  HENT:   Negative for hearing loss and voice change.   Eyes: Negative for eye problems and icterus.  Respiratory: Negative for chest tightness, cough and shortness of breath.   Cardiovascular: Negative for chest pain and leg swelling.  Gastrointestinal: Negative for abdominal distention and abdominal pain.  Endocrine: Negative for hot flashes.  Genitourinary: Negative for difficulty urinating, dysuria  and frequency.   Musculoskeletal: Negative for arthralgias.  Skin: Negative for itching and rash.  Neurological: Negative for light-headedness and numbness.  Hematological: Negative for adenopathy. Does not bruise/bleed easily.  Psychiatric/Behavioral: Negative for confusion.    MEDICAL HISTORY:  Past Medical History:  Diagnosis Date  . Allergy   . Bladder cancer ([place])    a. followed by Dr. Jacqlyn Larsen  . Chronic combined systolic (congestive) and diastolic (congestive) heart failure ([place])    a. 07/2016 Echo: EF 30-35%, sev mid-apicalanteroseptal, ant, inf HK, Gr1 DD.  Marland Kitchen COPD (chronic obstructive pulmonary disease) ([place])   . Hyperlipidemia   . Hypertension   . NICM (nonischemic cardiomyopathy) ([place])    a. 07/2016 Echo: EF 30-35%, sev mid-apicalanteroseptal, ant, inf HK, Gr1 DD, mild MR, mildly dil LA, PASP 27mHg - ? stress induced CM.  .Marland KitchenNonobstructive Coronary atherosclerosis    a. 07/2016 Cath: LM nl, LAD nl, D1 40ost, LCX 234mRCA nl, EF 25-30%.  . Marland KitchenAF (paroxysmal atrial fibrillation) ([place]   a. Dx 08/2013. CHA2DS2VASc = 6-->no OAC 2/2 h/o falls and nocompliance.  . Tremor, essential    sees [facility] neurologist  . Type 2 diabetes mellitus with complication, without long-term current use of insulin ([place]7/30/2014  . Urinary incontinence     SURGICAL HISTORY: Past Surgical History:  Procedure Laterality Date  . bladder cancer    . CATARACT EXTRACTION    . HERNIA REPAIR    . LEFT HEART CATH AND CORONARY ANGIOGRAPHY N/A 07/11/2016   Procedure: Left Heart Cath and Coronary Angiography;  Surgeon: ArWellington HampshireMD;  [facility];  Service: Cardiovascular;  Laterality: N/A;    SOCIAL HISTORY: Social History   Socioeconomic History  . Marital status: Legally Separated    Spouse name: Not on file  . Number of children: 3  . Years of education: 48  . Highest education level: Not on file  Occupational History  . Occupation: Retired Investment banker, operational  .  Financial resource strain: Not hard at all  . Food insecurity:    Worry: Never true    Inability: Never true  . Transportation needs:    Medical: No    Non-medical: No  Tobacco Use  . Smoking status: Former Smoker    Packs/day: 1.50    Years: 40.00    Pack years: 60.00    Last attempt to quit: 02/03/2003    Years since quitting: 15.0  . Smokeless tobacco: Never Used  Substance and Sexual Activity  . Alcohol use: Yes    Alcohol/week: 12.0 standard drinks    Types: 12 Standard drinks or equivalent per week    Frequency: Never    Comment: 1 drink with dinner  . Drug use: No  . Sexual activity: Never  Lifestyle  . Physical activity:    Days per week: 0 days    Minutes per session: Not on file  . Stress: Not at all  Relationships  . Social connections:    Talks on phone: Not on file    Gets together: Not on file    Attends religious service: Not on file    Active member of club or organization: Not on file    Attends meetings of clubs or organizations: Not on file    Relationship status: Not on file  . Intimate partner violence:    Fear of current or ex partner: No    Emotionally abused: No    Physically abused: No    Forced sexual activity: No  Other Topics Concern  . Not on file  Social History Narrative   Patient grew up in [place], [place]. He is separated from his wife since 72. They never divorced. Patient has 1 son and 2 daughters. 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    FAMILY HISTORY: Family History  Problem Relation Age of Onset  . Arthritis Mother   . Dementia Mother   . Cancer Maternal Uncle        prostate  . Cancer Maternal Aunt        breast cancer    ALLERGIES:  is allergic to latex.  MEDICATIONS:  Current Outpatient Medications  Medication Sig Dispense Refill  . acetaminophen (TYLENOL) 325 MG tablet Take 2 tablets (650 mg total) by mouth every 6 (six) hours as needed for mild pain (or Fever >/= 101).    Marland Kitchen ALPRAZolam (XANAX) 0.25  MG tablet Take 1 tablet (0.25 mg total) by mouth 2 (two) times daily as needed for anxiety or sleep. for anxiety 20 tablet 0  . atorvastatin (LIPITOR) 80 MG tablet TAKE 1 TABLET ONE TIME DAILY 90 tablet 3  . blood glucose meter kit and supplies KIT Dispense based on patient and insurance preference. Use up to four times daily as directed. 1 each 0  . fexofenadine (ALLEGRA) 180 MG tablet Take 180 mg by mouth daily as needed for allergies or rhinitis.    Marland Kitchen glimepiride (AMARYL) 4 MG tablet take 1 tablet by mouth WITH BREAKFAST. 90 tablet 1  . lisinopril (PRINIVIL,ZESTRIL) 20 MG tablet Take 1 tablet (20 mg total)  by mouth daily. 90 tablet 1  . meclizine (ANTIVERT) 12.5 MG tablet Take 1 tablet (12.5 mg total) by mouth 3 (three) times daily as needed for dizziness. 30 tablet 2  . metFORMIN (GLUCOPHAGE) 1000 MG tablet Take 1 tablet (1,000 mg total) by mouth 2 (two) times daily with a meal. 360 tablet 1  . nystatin-triamcinolone ointment (MYCOLOG) Apply 1 application topically 2 (two) times daily. 30 g 0  . pantoprazole (PROTONIX) 40 MG tablet Take 1 tablet (40 mg total) by mouth daily. 90 tablet 1  . PARoxetine (PAXIL) 40 MG tablet Take 1 tablet (40 mg total) by mouth daily. 90 tablet 1  . primidone (MYSOLINE) 50 MG tablet Take 1.5 tablets (75 mg total) by mouth 2 (two) times daily. 270 tablet 1  . tiotropium (SPIRIVA) 18 MCG inhalation capsule Place 18 mcg as needed into inhaler and inhale.     . metFORMIN (GLUCOPHAGE) 500 MG tablet TAKE 1 TABLET BY MOUTH TWICE A DAY (Patient not taking: Reported on 02/02/2018) 180 tablet 2   No current facility-administered medications for this visit.      PHYSICAL EXAMINATION: ECOG PERFORMANCE STATUS: 3 - Symptomatic, >50% confined to bed Vitals:   02/02/18 1523  BP: (!) 152/78  Pulse: (!) 50  Resp: 18  Temp: (!) 96.9 F (36.1 C)   Filed Weights   02/02/18 1523  Weight: 226 lb 9.6 oz (102.8 kg)    Physical Exam Constitutional:      General: He is not  in acute distress.    Appearance: He is obese.  HENT:     Head: Normocephalic and atraumatic.  Eyes:     General: No scleral icterus.    Pupils: Pupils are equal, round, and reactive to light.  Neck:     Musculoskeletal: Normal range of motion and neck supple.  Cardiovascular:     Rate and Rhythm: Normal rate and regular rhythm.     Heart sounds: Normal heart sounds.  Pulmonary:     Effort: Pulmonary effort is normal. No respiratory distress.     Breath sounds: No wheezing.  Abdominal:     General: Bowel sounds are normal. There is no distension.     Palpations: Abdomen is soft. There is no mass.     Tenderness: There is no abdominal tenderness.  Musculoskeletal: Normal range of motion.        General: No deformity.  Skin:    General: Skin is warm and dry.     Findings: No erythema or rash.  Neurological:     General: No focal deficit present.     Mental Status: He is alert. Mental status is at baseline.     Cranial Nerves: No cranial nerve deficit.     Coordination: Coordination normal.     Comments: Chronic hearing impairment.  Psychiatric:        Behavior: Behavior normal.        Thought Content: Thought content normal.      LABORATORY DATA:  I have reviewed the data as listed Lab Results  Component Value Date   WBC 7.0 02/02/2018   HGB 12.4 (L) 02/02/2018   HCT 36.9 (L) 02/02/2018   MCV 102.5 (H) 02/02/2018   PLT 162 02/02/2018   Recent Labs    06/15/17 1033 01/28/18 1829  NA 139 138  K 4.4 4.2  CL 104 104  CO2 27 29  GLUCOSE 123* 130*  BUN 40* 25*  CREATININE 1.26 1.07  CALCIUM 9.6 9.0  GFRNONAA  --  >  60  GFRAA  --  >60  PROT 6.8 6.4*  ALBUMIN 3.8 3.7  AST 18 25  ALT 20 28  ALKPHOS 85 76  BILITOT 0.4 0.7  BILIDIR  --  0.2  IBILI  --  0.5   Iron/TIBC/Ferritin/ %Sat No results found for: IRON, TIBC, FERRITIN, IRONPCTSAT   RADIOGRAPHIC STUDIES: I have personally reviewed the radiological images as listed and agreed with the findings in the  report. 01/28/2018 CT head without contrast. No CT evidence for acute intracranial abnormality.  Atrophy with small vessel ischemic changes of the white matter. 01/07/2017 CT abdomen pelvis Gastric distention without wall thickening, nonspecific.  Moderate colonic stool burden with sigmoid tortuosity.  Cholelithiasis aortic atherosclerosis.  ASSESSMENT & PLAN:  1. Macrocytic anemia    Anemia: multifactorial with possible causes including chronic blood loss, hyper/hypothyroidism, nutritional deficiency, infection/chronic inflammation, hemolysis, underlying bone marrow disorders. Will check CBC w differential, CMP, vitamin B12, Folate,blood smear, TSH,  LDH monoclonal gammopathy evaluation.    If above work-up results negative, given his advanced age, multiple comorbidities, poor performance status, I will hold additional testing including bone marrow biopsy.  Discussed with daughter. Orders Placed This Encounter  Procedures  . CBC with Differential/Platelet    Standing Status:   Future    Number of Occurrences:   1    Standing Expiration Date:   02/03/2019  . Technologist smear review    Standing Status:   Future    Number of Occurrences:   1    Standing Expiration Date:   02/03/2019  . Lactate dehydrogenase    Standing Status:   Future    Number of Occurrences:   1    Standing Expiration Date:   02/03/2019  . Vitamin B12    Standing Status:   Future    Number of Occurrences:   1    Standing Expiration Date:   02/03/2019  . Folate    Standing Status:   Future    Number of Occurrences:   1    Standing Expiration Date:   02/03/2019  . TSH    Standing Status:   Future    Number of Occurrences:   1    Standing Expiration Date:   02/03/2019  . Multiple Myeloma Panel (SPEP&IFE w/QIG)    Standing Status:   Future    Number of Occurrences:   1    Standing Expiration Date:   02/02/2019  . Kappa/lambda light chains    Standing Status:   Future    Number of Occurrences:   1    Standing  Expiration Date:   02/02/2019  . Retic Panel    Standing Status:   Future    Number of Occurrences:   1    Standing Expiration Date:   02/03/2019    All questions were answered. The patient knows to call the clinic with any problems questions or concerns.  Return of visit: 2 weeks Thank you for this kind referral and the opportunity to participate in the care of this patient. A copy of today's note is routed to referring provider  Total face to face encounter time for this patient visit was 75mn. >50% of the time was  spent in counseling and coordination of care.    ZEarlie Server MD, PhD Hematology Oncology [facility] AWhite [facility] Jct Va Medical CenterPager- [phone]/01/2018

## 2018-02-05 LAB — KAPPA/LAMBDA LIGHT CHAINS
Kappa free light chain: 33.7 mg/L — ABNORMAL HIGH (ref 3.3–19.4)
Kappa, lambda light chain ratio: 1.94 — ABNORMAL HIGH (ref 0.26–1.65)
Lambda free light chains: 17.4 mg/L (ref 5.7–26.3)

## 2018-02-06 LAB — MULTIPLE MYELOMA PANEL, SERUM
ALBUMIN SERPL ELPH-MCNC: 3.3 g/dL (ref 2.9–4.4)
Albumin/Glob SerPl: 1.4 (ref 0.7–1.7)
Alpha 1: 0.2 g/dL (ref 0.0–0.4)
Alpha2 Glob SerPl Elph-Mcnc: 0.7 g/dL (ref 0.4–1.0)
B-Globulin SerPl Elph-Mcnc: 1 g/dL (ref 0.7–1.3)
GAMMA GLOB SERPL ELPH-MCNC: 0.7 g/dL (ref 0.4–1.8)
GLOBULIN, TOTAL: 2.5 g/dL (ref 2.2–3.9)
IgA: 376 mg/dL (ref 61–437)
IgG (Immunoglobin G), Serum: 987 mg/dL (ref 700–1600)
IgM (Immunoglobulin M), Srm: 56 mg/dL (ref 15–143)
Total Protein ELP: 5.8 g/dL — ABNORMAL LOW (ref 6.0–8.5)

## 2018-02-20 ENCOUNTER — Telehealth: Payer: Self-pay

## 2018-02-20 NOTE — Telephone Encounter (Signed)
Copied from [place] [id number]. Topic: Appointment Scheduling - Prior Auth Required for Appointment >> Feb 20, 2018  2:36 PM Vernona Rieger wrote: Patient's daughter, [name] is concerned he is getting dementia. She said he is very unable to care for himself and he lives alone. He seems like he has panic attacks from being alone. She said that he is having frequent memory loss and is scared to be alone. She would like him to see Dr Caryl Bis as soon as he can. She can be reached @ [phone]

## 2018-02-20 NOTE — Telephone Encounter (Signed)
Sent to PCP when would you like to fit pt into your schedule?

## 2018-02-20 NOTE — Telephone Encounter (Signed)
Copied from [place] [id number]. Topic: Appointment Scheduling - Prior Auth Required for Appointment >> Feb 20, 2018  2:36 PM Vernona Rieger wrote: Patient's daughter, [name] is concerned he is getting dementia. She said he is very unable to care for himself and he lives alone. He seems like he has panic attacks from being alone. She said that he is having frequent memory loss and is scared to be alone. She would like him to see Dr Caryl Bis as soon as he can. She can be reached @ [phone]

## 2018-02-21 ENCOUNTER — Ambulatory Visit: Payer: Medicare HMO

## 2018-02-21 NOTE — Telephone Encounter (Signed)
Duplicated message.

## 2018-02-22 NOTE — Telephone Encounter (Signed)
02/27/18 at 4:30 if the time slot has not been filled.

## 2018-02-22 NOTE — Telephone Encounter (Signed)
Scheduled

## 2018-02-22 NOTE — Telephone Encounter (Signed)
Called and spoke with pt's daughter. Daughter advised that he has been scheduled for an appt 02/27/2018 @ 4:30 PM for f/u dementia and poss UTI.   [name] please schedule this appt.   Thanks

## 2018-02-24 DIAGNOSIS — E11621 Type 2 diabetes mellitus with foot ulcer: Secondary | ICD-10-CM | POA: Diagnosis not present

## 2018-02-24 DIAGNOSIS — E1151 Type 2 diabetes mellitus with diabetic peripheral angiopathy without gangrene: Secondary | ICD-10-CM | POA: Diagnosis not present

## 2018-02-24 DIAGNOSIS — E785 Hyperlipidemia, unspecified: Secondary | ICD-10-CM | POA: Diagnosis not present

## 2018-02-24 DIAGNOSIS — J449 Chronic obstructive pulmonary disease, unspecified: Secondary | ICD-10-CM | POA: Diagnosis not present

## 2018-02-24 DIAGNOSIS — E669 Obesity, unspecified: Secondary | ICD-10-CM | POA: Diagnosis not present

## 2018-02-24 DIAGNOSIS — G8929 Other chronic pain: Secondary | ICD-10-CM | POA: Diagnosis not present

## 2018-02-24 DIAGNOSIS — Z008 Encounter for other general examination: Secondary | ICD-10-CM | POA: Diagnosis not present

## 2018-02-24 DIAGNOSIS — R69 Illness, unspecified: Secondary | ICD-10-CM | POA: Diagnosis not present

## 2018-02-24 DIAGNOSIS — L97521 Non-pressure chronic ulcer of other part of left foot limited to breakdown of skin: Secondary | ICD-10-CM | POA: Diagnosis not present

## 2018-02-24 DIAGNOSIS — G4700 Insomnia, unspecified: Secondary | ICD-10-CM | POA: Diagnosis not present

## 2018-02-27 ENCOUNTER — Ambulatory Visit (INDEPENDENT_AMBULATORY_CARE_PROVIDER_SITE_OTHER): Payer: Medicare HMO | Admitting: Family Medicine

## 2018-02-27 ENCOUNTER — Encounter: Payer: Self-pay | Admitting: Family Medicine

## 2018-02-27 VITALS — BP 130/60 | HR 42 | Temp 98.5°F

## 2018-02-27 DIAGNOSIS — R410 Disorientation, unspecified: Secondary | ICD-10-CM

## 2018-02-27 DIAGNOSIS — J309 Allergic rhinitis, unspecified: Secondary | ICD-10-CM

## 2018-02-27 DIAGNOSIS — I480 Paroxysmal atrial fibrillation: Secondary | ICD-10-CM

## 2018-02-27 DIAGNOSIS — R001 Bradycardia, unspecified: Secondary | ICD-10-CM | POA: Diagnosis not present

## 2018-02-27 DIAGNOSIS — R350 Frequency of micturition: Secondary | ICD-10-CM | POA: Diagnosis not present

## 2018-02-27 LAB — POCT URINALYSIS DIPSTICK
BILIRUBIN UA: NEGATIVE
Blood, UA: NEGATIVE
Glucose, UA: NEGATIVE
KETONES UA: NEGATIVE
Leukocytes, UA: NEGATIVE
Nitrite, UA: NEGATIVE
Protein, UA: NEGATIVE
Spec Grav, UA: 1.02 (ref 1.010–1.025)
Urobilinogen, UA: 0.2 E.U./dL
pH, UA: 5.5 (ref 5.0–8.0)

## 2018-02-27 MED ORDER — FLUTICASONE PROPIONATE 50 MCG/ACT NA SUSP: 2.0000 | Freq: Every day | NASAL | 6 refills | Status: DC

## 2018-02-27 NOTE — Patient Instructions (Signed)
Nice to see you. We will get labs and contact you with the results.  We will get you to see cardiology.  If you develop chest pain, shortness of breath, palpitations, fatigue, or confusion please seek medical attention in the ED.

## 2018-02-28 LAB — CBC
HCT: 37.5 % — ABNORMAL LOW (ref 39.0–52.0)
HEMOGLOBIN: 12.8 g/dL — AB (ref 13.0–17.0)
MCHC: 34 g/dL (ref 30.0–36.0)
MCV: 103.4 fl — ABNORMAL HIGH (ref 78.0–100.0)
PLATELETS: 191 10*3/uL (ref 150.0–400.0)
RBC: 3.63 Mil/uL — AB (ref 4.22–5.81)
RDW: 13.4 % (ref 11.5–15.5)
WBC: 6.7 10*3/uL (ref 4.0–10.5)

## 2018-02-28 LAB — COMPREHENSIVE METABOLIC PANEL
ALT: 23 U/L (ref 0–53)
AST: 18 U/L (ref 0–37)
Albumin: 3.9 g/dL (ref 3.5–5.2)
Alkaline Phosphatase: 79 U/L (ref 39–117)
BUN: 27 mg/dL — ABNORMAL HIGH (ref 6–23)
CO2: 26 mEq/L (ref 19–32)
Calcium: 9.5 mg/dL (ref 8.4–10.5)
Chloride: 101 mEq/L (ref 96–112)
Creatinine, Ser: 1.08 mg/dL (ref 0.40–1.50)
GFR: 65.29 mL/min (ref >60.00)
Glucose, Bld: 171 mg/dL — ABNORMAL HIGH (ref 70–99)
Potassium: 4.8 mEq/L (ref 3.5–5.1)
Sodium: 136 mEq/L (ref 135–145)
Total Bilirubin: 0.5 mg/dL (ref 0.2–1.2)
Total Protein: 6.9 g/dL (ref 6.0–8.3)

## 2018-03-01 ENCOUNTER — Telehealth: Payer: Self-pay | Admitting: Family Medicine

## 2018-03-01 DIAGNOSIS — R350 Frequency of micturition: Secondary | ICD-10-CM | POA: Insufficient documentation

## 2018-03-01 DIAGNOSIS — R410 Disorientation, unspecified: Secondary | ICD-10-CM | POA: Insufficient documentation

## 2018-03-01 LAB — TSH: TSH: 2.02 u[IU]/mL (ref 0.35–4.50)

## 2018-03-01 NOTE — Assessment & Plan Note (Signed)
Patient with confusion and memory difficulty recently.  Appears to be asymptomatic currently.  This could represent dementia.  Less likely related to his bradycardia.  Unlikely related to UTI given lack of evidence on urinalysis.  Discussed evaluation with lab work.  Consider referral to neurology after lab work returns.

## 2018-03-01 NOTE — Assessment & Plan Note (Signed)
I suspect this is the cause of his nasal congestion.  Discussed over-the-counter Flonase.

## 2018-03-01 NOTE — Assessment & Plan Note (Signed)
Asymptomatic.  Not on medication.  EKG today was completed given significant bradycardia on exam.  I discussed the EKG with Dr. Saunders Revel who felt this represented sinus bradycardia.  Patient is overall asymptomatic though he does not do much physical activity.  Dr. Saunders Revel noted that potentially the bradycardia could be contributing to his confusion though given the patient's age noted that there are a number of other things it could be contributing as well.  He advised having the patient follow-up in the office in the very least.  If the patient became symptomatic he recommended emergency department evaluation.  The patient does not appear to be confused at this time.  Currently he is asymptomatic and thus we will refer to cardiology.  I advised the patient's daughter that if his confusion worsens or develops any new symptoms he should go to the ED.

## 2018-03-01 NOTE — Progress Notes (Signed)
Tommi Rumps, MD Phone: [phone]  Chase Simmons is a 85 y.o. male who presents today for follow-up.  CC: Memory difficulty, atrial fibrillation, dysuria, nasal congestion  Memory difficulty: Patient's daughter notes this started about a month or so ago.  He has had quite a bit of confusion and is forgetting where he is and forgetting who people are.  He is not been sleeping as much.  He had a CT scan of his head a month in the ED that was unremarkable.  His symptoms started prior to him going to the ED.  He has had no numbness or focal weakness.  His daughter notes he is essentially wheelchair-bound as he has difficulty getting out of the chair.  No depression.  Atrial fibrillation: Has had no palpitations, chest pain, shortness of breath.  He is not on anticoagulation or rate control medication given bradycardia previously and instability.  Dysuria: Patient's daughter reports he had some intermittent burning and was checked by urology.  He has had some urinary frequency.  He has had no abdominal pain or fever.  His daughter wonders if he has a UTI.  Nasal congestion: Patient notes his nose is clogged up intermittently.  Will have some rhinorrhea and postnasal drip.  He is occasionally blowing mucus out of his nose.  Is not taking medication for this.  Social History   Tobacco Use  Smoking Status Former Smoker  . Packs/day: 1.50  . Years: 40.00  . Pack years: 60.00  . Last attempt to quit: 02/03/2003  . Years since quitting: 15.0  Smokeless Tobacco Never Used     ROS see history of present illness  Objective  Physical Exam Vitals:   02/27/18 1619  BP: 130/60  Pulse: (!) 42  Temp: 98.5 F (36.9 C)  SpO2: 99%    BP Readings from Last 3 Encounters:  02/27/18 130/60  02/02/18 (!) 152/78  01/28/18 (!) 172/89   Wt Readings from Last 3 Encounters:  02/02/18 226 lb 9.6 oz (102.8 kg)  01/28/18 230 lb (104.3 kg)  10/02/17 232 lb (105.2 kg)    Physical  Exam Constitutional:      General: He is not in acute distress.    Appearance: He is not diaphoretic.  HENT:     Mouth/Throat:     Mouth: Mucous membranes are moist.     Pharynx: Oropharynx is clear.  Eyes:     Conjunctiva/sclera: Conjunctivae normal.     Pupils: Pupils are equal, round, and reactive to light.  Cardiovascular:     Rate and Rhythm: Regular rhythm. Bradycardia present.     Heart sounds: Normal heart sounds.  Pulmonary:     Effort: Pulmonary effort is normal.     Breath sounds: Normal breath sounds.  Abdominal:     General: Bowel sounds are normal. There is no distension.     Palpations: Abdomen is soft.     Tenderness: There is no abdominal tenderness. There is no guarding or rebound.  Musculoskeletal:     Right lower leg: No edema.     Left lower leg: No edema.  Skin:    General: Skin is warm and dry.  Neurological:     Mental Status: He is alert and oriented to person, place, and time.     Comments: CN 2-12 intact, 5/5 strength in bilateral biceps, triceps, grip, quads, hamstrings, plantar and dorsiflexion, sensation to light touch intact in bilateral UE and LE      Assessment/Plan: Please see individual problem  list.  PAF (paroxysmal atrial fibrillation) (HCC) Asymptomatic.  Not on medication.  EKG today was completed given significant bradycardia on exam.  I discussed the EKG with Dr. Saunders Revel who felt this represented sinus bradycardia.  Patient is overall asymptomatic though he does not do much physical activity.  Dr. Saunders Revel noted that potentially the bradycardia could be contributing to his confusion though given the patient's age noted that there are a number of other things it could be contributing as well.  He advised having the patient follow-up in the office in the very least.  If the patient became symptomatic he recommended emergency department evaluation.  The patient does not appear to be confused at this time.  Currently he is asymptomatic and thus we will  refer to cardiology.  I advised the patient's daughter that if his confusion worsens or develops any new symptoms he should go to the ED.  Confusion Patient with confusion and memory difficulty recently.  Appears to be asymptomatic currently.  This could represent dementia.  Less likely related to his bradycardia.  Unlikely related to UTI given lack of evidence on urinalysis.  Discussed evaluation with lab work.  Consider referral to neurology after lab work returns.  Frequency of urination Normal urinalysis.  Discussed having him follow-up with his urologist.  Allergic rhinitis I suspect this is the cause of his nasal congestion.  Discussed over-the-counter Flonase.   Orders Placed This Encounter  Procedures  . Comp Met (CMET)  . CBC  . TSH  . Ambulatory referral to Cardiology    Referral Priority:   Routine    Referral Type:   Consultation    Referral Reason:   Specialty Services Required    Requested Specialty:   Cardiology    Number of Visits Requested:   1  . POCT Urinalysis Dipstick  . EKG 12-Lead    Meds ordered this encounter  Medications  . fluticasone (FLONASE) 50 MCG/ACT nasal spray    Sig: Place 2 sprays into both nostrils daily.    Dispense:  16 g    Refill:  [place], MD [place]

## 2018-03-01 NOTE — Assessment & Plan Note (Signed)
Normal urinalysis.  Discussed having him follow-up with his urologist.

## 2018-03-01 NOTE — Telephone Encounter (Signed)
Copied from [place] [id number]. Topic: Quick Communication - See Telephone Encounter >> Mar 01, 2018  9:13 AM Rayann Heman wrote: CRM for notification. See Telephone encounter for: 03/01/18. Pt daughter called and stated that she would like from the nurse because patient and Dr Caryl Bis discussed sinus issues. Pt daughter would like to know If something will be called in or if she should give him something over the counter. Please advise

## 2018-03-02 ENCOUNTER — Telehealth: Payer: Self-pay | Admitting: *Deleted

## 2018-03-02 DIAGNOSIS — R413 Other amnesia: Secondary | ICD-10-CM

## 2018-03-02 DIAGNOSIS — F419 Anxiety disorder, unspecified: Secondary | ICD-10-CM

## 2018-03-02 DIAGNOSIS — R2689 Other abnormalities of gait and mobility: Secondary | ICD-10-CM

## 2018-03-02 NOTE — Telephone Encounter (Signed)
Please advise.  Chase Simmons

## 2018-03-02 NOTE — Telephone Encounter (Signed)
Referral placed. I will forward to Melissa to send off.

## 2018-03-02 NOTE — Telephone Encounter (Signed)
Copied from [place] [id number]. Topic: General - Inquiry >> Mar 02, 2018  8:34 AM Alanda Slim E wrote: Reason for CRM: Pts daughter called to see if they could expedite someone coming in and evaluating the Pt for home health Care. Pt was found wandering around in his wheelchair wondering where he is and they have been waiting on someone to call from an agency that Dr. Caryl Bis was to advise / please advise asap

## 2018-03-05 ENCOUNTER — Telehealth: Payer: Self-pay

## 2018-03-05 DIAGNOSIS — R410 Disorientation, unspecified: Secondary | ICD-10-CM

## 2018-03-05 NOTE — Telephone Encounter (Signed)
Copied from [place] [id number]. Topic: General - Other >> Mar 05, 2018 10:10 AM Carolyn Stare wrote: Pt daughter [name] call to say she thought Dr Milbert Coulter was going to refer pt to a Urologist about having dementia  , she said over the weekend he did not know his children

## 2018-03-05 NOTE — Telephone Encounter (Signed)
Patient's daughter wants to know about referral to neurology per Dr. Marlaine Hind lab note on 03/01/18.

## 2018-03-05 NOTE — Telephone Encounter (Signed)
Call daughter Please inform her that I am covering Dr. Caryl Bis this week.  I do not see a referral placed for neurology yet however I see that Dr. Caryl Bis mentioned this multiple times in his notes.  I placed a referral today.  Please advise him to give [place] a call the next few days if they have not heard from [place] in regards to an appointment.  Melissa, how  soon can we get patient into neurology?

## 2018-03-05 NOTE — Addendum Note (Signed)
Addended by: Burnard Hawthorne on: 03/05/2018 12:18 PM   Modules accepted: Orders

## 2018-03-06 NOTE — Telephone Encounter (Signed)
Just received correspondence from [facility],  I just wanted to follow back up with you and let you know I spoke with your patient. He refused all services and said he didnt need anyone right now to help him And he didnt want the out-of-pocket co-pay with his insurance.  Please let me know if theres anything else I can help you with.   Tresea Mall    Thanks, Lenna Sciara

## 2018-03-07 NOTE — Telephone Encounter (Signed)
Any luck on neurology appt?

## 2018-03-08 NOTE — Telephone Encounter (Signed)
It looks like [facility] has him scheduled on 4/4 with Dr. Melrose Nakayama.

## 2018-03-12 NOTE — Telephone Encounter (Signed)
Noted. Can you follow-up with the patient and his daughter to see if they would be willing to have home health come in as it would likely be beneficial for him to be evaluated by them? Thanks.

## 2018-03-12 NOTE — Telephone Encounter (Signed)
Noted. We could try to get him in to see someone sooner in [place] if they would prefer.

## 2018-03-12 NOTE — Telephone Encounter (Signed)
FYI Chase Simmons

## 2018-03-13 NOTE — Telephone Encounter (Signed)
Called pt's daughter [name] and left a VM to call back ASAP. CRM created and sent to [facility] pool.

## 2018-03-14 MED ORDER — ALPRAZOLAM 0.25 MG PO TABS: 0.2500 mg | ORAL_TABLET | Freq: Two times a day (BID) | ORAL | 0 refills | Status: DC | PRN

## 2018-03-14 NOTE — Telephone Encounter (Signed)
I will forward to Melissa to see if she can get in touch with home health and see if they are able to contact the patient's daughter.  I am unsure if this is possible.  He should not be using Flonase as he feels.  He should be using this once daily.  You should not take Benadryl as this could cause additional issues related to side effects.  I have refilled his Xanax.

## 2018-03-14 NOTE — Addendum Note (Signed)
Addended by: Leone Haven on: 03/14/2018 08:40 PM   Modules accepted: Orders

## 2018-03-14 NOTE — Telephone Encounter (Signed)
[name] called back and I spoke with pt's daughter. Daughter stated that her father really needs to go to a rehab facility and then be transitions to nursing care facility or have a social worker to come out to evaluate him and then have him transition to a nursing care facility. [name] stated that she knows of two different home health companies who have reached out to assist her father for things such as PT. However, these companies are reaching out to her father and NOT her and he is declining these services.   [name] stated that he father is NOT coherent. Half the time he can't even remember her name pt stated and when these companies reach out to him, he truly does NOT understand. [name] definitely feels that her father needs assist and if anything is done [name] needs to be the person they reach out to NOT her father. [name]'s number [phone].   Plus she stated that her father is misusing his Flonase he takes it as often as he feels that he needs it and he told [name] that Dr. Caryl Bis told him he could take an allergy medication. However, tammy was with him at his last OV and she does NOT recall this being discussed. She wanted to know if her father could take a Benadryl at night for his allergies and to help him sleep because he has not been sleeping well due to his increased anxiety. Daughter is just concerned what to give him since he has a very low heart rate.   Pt does need a refill on his Xanax he is out and this will be going to [facility].

## 2018-03-15 NOTE — Telephone Encounter (Signed)
Sent contact information to [place].

## 2018-03-16 ENCOUNTER — Telehealth: Payer: Self-pay | Admitting: Urology

## 2018-03-16 NOTE — Telephone Encounter (Signed)
Called and spoke with [name]. [name] advised and voiced understanding she stated that she did get a call yesterday from [facility].

## 2018-03-16 NOTE — Telephone Encounter (Signed)
Pt's daughter [name] called and cancelled his Cysto, she states she will call at a later time to r/s.

## 2018-03-16 NOTE — Telephone Encounter (Signed)
Called [name] and left a VM to call back. CRM created and sent to [facility] pool.

## 2018-03-20 ENCOUNTER — Other Ambulatory Visit: Payer: Self-pay | Admitting: Urology

## 2018-03-28 ENCOUNTER — Telehealth: Payer: Self-pay

## 2018-03-28 NOTE — Telephone Encounter (Signed)
Spoke with patient reference Telephone visit he agreed and consented to the Telehealth call. The Consent was read to patient and he agreed with this.  I reached out to the patient's daughter and will send her a copy of the consent via email.    TELEPHONE CALL NOTE  Chase Simmons has been deemed a candidate for a follow-up tele-health visit to limit community exposure during the Covid-19 pandemic. I spoke with the patient via phone to ensure availability of phone/video source, confirm preferred email & phone number, discuss instructions and expectations, and review consent.   I reminded Chase Simmons to be prepared with any vital sign and/or heart rhythm information that could potentially be obtained via home monitoring, at the time of his visit.  Finally, I reminded Chase Simmons to expect an e-mail containing a link for their video-based visit approximately 15 minutes before his visit, or alternatively, a phone call at the time of his visit if his visit is planned to be a phone encounter.  Did the patient verbally consent to treatment as below? Yes  Alba Destine, RMA 03/28/2018 2:41 PM    CONSENT FOR TELE-HEALTH VISIT - PLEASE REVIEW  I hereby voluntarily request, consent and authorize [facility] and its employed or contracted physicians, physician assistants, nurse practitioners or other licensed health care professionals (the Practitioner), to provide me with telemedicine health care services (the "Services") as deemed necessary by the treating Practitioner. I acknowledge and consent to receive the Services by the Practitioner via telemedicine. I understand that the telemedicine visit will involve communicating with the Practitioner through live audiovisual communication technology and the disclosure of certain medical information by electronic transmission. I acknowledge that I have been given the opportunity to request an in-person assessment or other available alternative prior  to the telemedicine visit and am voluntarily participating in the telemedicine visit.  I understand that I have the right to withhold or withdraw my consent to the use of telemedicine in the course of my care at any time, without affecting my right to future care or treatment, and that the Practitioner or I may terminate the telemedicine visit at any time. I understand that I have the right to inspect all information obtained and/or recorded in the course of the telemedicine visit and may receive copies of available information for a reasonable fee.  I understand that some of the potential risks of receiving the Services via telemedicine include:  Marland Kitchen Delay or interruption in medical evaluation due to technological equipment failure or disruption; . Information transmitted may not be sufficient (e.g. poor resolution of images) to allow for appropriate medical decision making by the Practitioner; and/or  . In rare instances, security protocols could fail, causing a breach of personal health information.  Furthermore, I acknowledge that it is my responsibility to provide information about my medical history, conditions and care that is complete and accurate to the best of my ability. I acknowledge that Practitioner's advice, recommendations, and/or decision may be based on factors not within their control, such as incomplete or inaccurate data provided by me or distortions of diagnostic images or specimens that may result from electronic transmissions. I understand that the practice of medicine is not an exact science and that Practitioner makes no warranties or guarantees regarding treatment outcomes. I acknowledge that I will receive a copy of this consent concurrently upon execution via email to the email address I last provided but may also request a printed copy by calling the office  of [place].    I understand that my insurance will be billed for this visit.   I have read or had this consent read  to me. . I understand the contents of this consent, which adequately explains the benefits and risks of the Services being provided via telemedicine.  . I have been provided ample opportunity to ask questions regarding this consent and the Services and have had my questions answered to my satisfaction. . I give my informed consent for the services to be provided through the use of telemedicine in my medical care  By participating in this telemedicine visit I agree to the above.

## 2018-03-30 NOTE — Progress Notes (Signed)
Virtual Visit via Telephone Note    Evaluation Performed:  Follow-up visit  This visit type was conducted due to national recommendations for restrictions regarding the COVID-19 Pandemic (e.g. social distancing).  This format is felt to be most appropriate for this patient at this time.  All issues noted in this document were discussed and addressed.  No physical exam was performed (except for noted visual exam findings with Video Visits).  Please refer to the patient's chart (MyChart message for video visits and phone note for telephone visits) for the patient's consent to telehealth for [facility]  Date:  04/03/2018   ID:  Chase Simmons, DOB 1933/05/04, MRN [id number]  Patient Location:  [address]  [address] [place] [address]   Provider location:   Home  PCP:  Leone Haven, MD  Cardiologist:  Kathlyn Sacramento, MD  Electrophysiologist:  None   Chief Complaint:  Telehealth follow up  History of Present Illness:    Chase Simmons is a 85 y.o. male who presents via audio/video conferencing for a telehealth visit today.  He has a history of nonobstructive CAD, combined CHF, NICM, PAF not on [place] secondary to recurrent falls and noncompliance, DM, COPD, HTN, HLD, essential tremor, macrocytic anemia, bradycardia, obesity, and bladder cancer.   Patient was admitted to the hospital in 07/2016 multiple times with dizziness and nausea. Echo showed an EF of 30-35% with severe mid apical anteroseptal, anterior, and inferior hypokinesis. Diagnostic LHC showed mild nonobstructive CAD. It was felt his cardiomyopathy may have been stress-induced. He did not follow up with cardiology until 11/2016 and was noted to have had several further ER visits and admissions related to dizziness and nausea in between his visit and 11/2016 visit.   He was last seen in the office in 11/2016 as above and was noted to be noncompliant with heart healthy diet, eating convenience foods and canned soups.  Follow up echo in 01/2017 showed improvement in his EF to 45-50%, mildly dilated LV, diffuse hypokinesis, normal LV diastolic function, mild MR, PASP 31 mmHg.   Since he was last seen in the office in 11/2016, he has been seen in the ED 3 times for dizziness/vertigo, most recently in 01/2018. Work up at that time showed CT head without acute abnormality with negative cardiac enzyme and nonacute EKG. patient followed up with PCP in late 02/2018 and was noted to be bradycardic on exam.  Follow-up EKG showed sinus bradycardia, 38 bpm, low voltage QRS along the precordial leads, rare PVC, nonspecific IVCD, nonspecific ST-T changes.  Labs: 02/2018 - TSH normal, HGB 12.8, K+ 4.6, SCr 1.08, LFT normal  Spoke with patient and his daughter over the phone today for follow-up of bradycardia.  Patient is hard of hearing and has been more confused as of late leading to most of the history and discussion being undertaken with the patient's daughter.  She indicates she has known about his bradycardic heart rate for many years.  Historically, this has been asymptomatic.  As of late, the patient has been more confused.  He has been referred to neurology and has an appointment with them in mid April.  There is some concern for possible underlying dementia.  In talking with the patient and his daughter today they both deny any recent presyncope or syncope.  Patient's daughter indicates the patient told her he fell outdoors while walking in the yard 1 day prior.  Patient's daughter states "I know that did not happen."  She  further substantiates that by stating the patient has been wheelchair-bound and no longer ambulates.  There also was no trauma to the patient.  He reports improved dizziness, even without meclizine.  Since he is now taking meclizine on an as-needed basis he has not noted any change in his confusion.  No chest pain, palpitations, shortness of breath, lower extremity swelling, abdominal tension, orthopnea, PND,  early satiety.   The patient does not have symptoms concerning for COVID-19 infection (fever, chills, cough, or new SHORTNESS OF BREATH).    Prior CV studies:   The following studies were reviewed today:  2D echo 01/2017:  - Left ventricle: The cavity size was mildly dilated. Systolic   function was mildly reduced. The estimated ejection fraction was   in the range of 45% to 50%. Diffuse hypokinesis. Left ventricular   diastolic function parameters were normal. - Mitral valve: There was mild regurgitation. - Pulmonary arteries: Systolic pressure was within the normal   range. PA peak pressure: 31 mm Hg (S).  Impressions:  - Significant improvement in EF since last echo. __________  [facility] 07/2016:  The left ventricular ejection fraction is 25-35% by visual estimate.  There is severe left ventricular systolic dysfunction.  LV end diastolic pressure is mildly elevated.  Mid Cx lesion, 20 %stenosed.  Ost 1st Diag to 1st Diag lesion, 40 %stenosed.   1. Mild nonobstructive coronary artery disease. 2. Severely reduced LV systolic function with an EF of 25-30%. Wall motion abnormalities suggestive of stress-induced cardiomyopathy. Mildly elevated left ventricular end-diastolic pressure.  Recommendations: The patient's presentation and cardiac cath findings are consistent with stress-induced cardiomyopathy. Recommend medical therapy. He is already on ACE inhibitor. I started small dose carvedilol. __________  2D echo 07/2016:  - Left ventricle: The cavity size was mildly dilated. There was   mild concentric hypertrophy. Systolic function was moderately to   severely reduced. The estimated ejection fraction was in the   range of 30% to 35%. Severe hypokinesis of the   mid-apicalanteroseptal, anterior, and inferior myocardium.   Doppler parameters are consistent with abnormal left ventricular   relaxation (grade 1 diastolic dysfunction). - Mitral valve: There was mild  regurgitation. - Left atrium: The atrium was mildly dilated. - Pulmonary arteries: Systolic pressure was mildly increased. PA   peak pressure: 45 mm Hg (S).  Impressions:  - wall motion abnormalities are suggestive of stress induced vs.   ischemic cardiomyopathy. __________  Outpatient cardiac monitoring 04/2016:  Long-term monitor, 2 days 21 hours Overall underlying rhythm was atrial fibrillation, 85% AF burden. The heart rate ranged from 35 to 130 bpm, average of 54 BPM. Sinus rate 35-67, average of 54 BPM There were several episodes, possibly 6 episodes, typically early morning, where there was a note of a regular rhythm with no discernible P waves for < 20beats. There is concern for possible AV block with a junctional escape, with heart rates in the 40s to 50s. 04/14/2016 10:42 AM, 04/14/2016, 3:38 AM, 04/15/2016 7:59 AM, 04/15/2016 8:33 AM, 04/15/2016 10:49 AM, 11:58 AM, and 04/16/2016 12:55 AM. One episode of possible 3 beat ventricular tachycardia at a rate of 1:30 bpm was noted. No symptoms documented. __________  Past Medical History:  Diagnosis Date  . Allergy   . Bladder cancer ([place])    a. followed by Dr. Jacqlyn Larsen  . Chronic combined systolic (congestive) and diastolic (congestive) heart failure ([place])    a. 07/2016 Echo: EF 30-35%, sev mid-apicalanteroseptal, ant, inf HK, Gr1 DD.  Marland Kitchen COPD (chronic  obstructive pulmonary disease) ([place])   . Hyperlipidemia   . Hypertension   . NICM (nonischemic cardiomyopathy) ([place])    a. 07/2016 Echo: EF 30-35%, sev mid-apicalanteroseptal, ant, inf HK, Gr1 DD, mild MR, mildly dil LA, PASP 52mHg - ? stress induced CM.  .Marland KitchenNonobstructive Coronary atherosclerosis    a. 07/2016 Cath: LM nl, LAD nl, D1 40ost, LCX 277mRCA nl, EF 25-30%.  . Marland KitchenAF (paroxysmal atrial fibrillation) [place]   a. Dx 08/2013. CHA2DS2VASc = 6-->no OAC 2/2 h/o falls and nocompliance.  . Tremor, essential    sees [facility] neurologist  . Type 2 diabetes mellitus with complication,  without long-term current use of insulin ([place]/2014  . Urinary incontinence    Past Surgical History:  Procedure Laterality Date  . bladder cancer    . CATARACT EXTRACTION    . HERNIA REPAIR    . LEFT HEART CATH AND CORONARY ANGIOGRAPHY N/A 07/11/2016   Procedure: Left Heart Cath and Coronary Angiography;  Surgeon: ArWellington HampshireMD;  [facility];  Service: Cardiovascular;  Laterality: N/A;     Current Meds  Medication Sig  . acetaminophen (TYLENOL) 325 MG tablet Take 2 tablets (650 mg total) by mouth every 6 (six) hours as needed for mild pain (or Fever >/= 101).  . Marland KitchenLPRAZolam (XANAX) 0.25 MG tablet Take 1 tablet (0.25 mg total) by mouth 2 (two) times daily as needed for anxiety or sleep. for anxiety  . atorvastatin (LIPITOR) 80 MG tablet TAKE 1 TABLET ONE TIME DAILY  . blood glucose meter kit and supplies KIT Dispense based on patient and insurance preference. Use up to four times daily as directed.  . fluticasone (FLONASE) 50 MCG/ACT nasal spray Place 2 sprays into both nostrils daily.  . Marland Kitchenlimepiride (AMARYL) 4 MG tablet take 1 tablet by mouth WITH BREAKFAST.  . Marland Kitchenisinopril (PRINIVIL,ZESTRIL) 20 MG tablet Take 1 tablet (20 mg total) by mouth daily.  . meclizine (ANTIVERT) 12.5 MG tablet Take 1 tablet (12.5 mg total) by mouth 3 (three) times daily as needed for dizziness.  . metFORMIN (GLUCOPHAGE) 500 MG tablet TAKE 1 TABLET BY MOUTH TWICE A DAY  . pantoprazole (PROTONIX) 40 MG tablet Take 1 tablet (40 mg total) by mouth daily.  . Marland KitchenARoxetine (PAXIL) 40 MG tablet Take 1 tablet (40 mg total) by mouth daily.  . primidone (MYSOLINE) 50 MG tablet Take 1.5 tablets (75 mg total) by mouth 2 (two) times daily. (Patient taking differently: Take 50 mg by mouth 2 (two) times daily. )  . tiotropium (SPIRIVA) 18 MCG inhalation capsule Place 18 mcg as needed into inhaler and inhale.      Allergies:   Latex   Social History   Tobacco Use  . Smoking status: Former Smoker     Packs/day: 1.50    Years: 40.00    Pack years: 60.00    Last attempt to quit: 02/03/2003    Years since quitting: 15.1  . Smokeless tobacco: Never Used  Substance Use Topics  . Alcohol use: Yes    Alcohol/week: 12.0 standard drinks    Types: 12 Standard drinks or equivalent per week    Frequency: Never    Comment: 1 drink with dinner  . Drug use: No     Family Hx: The patient's family history includes Arthritis in his mother; Cancer in his maternal aunt and maternal uncle; Dementia in his mother.  ROS:   Please see the history of present illness.  All other systems reviewed and are negative.   Labs/Other Tests and Data Reviewed:    Recent Labs: 02/27/2018: ALT 23; BUN 27; Creatinine, Ser 1.08; Hemoglobin 12.8; Platelets 191.0; Potassium 4.8; Sodium 136; TSH 2.02   Recent Lipid Panel Lab Results  Component Value Date/Time   CHOL 204 (H) 06/15/2017 10:33 AM   TRIG 112.0 06/15/2017 10:33 AM   HDL 45.00 06/15/2017 10:33 AM   CHOLHDL 5 06/15/2017 10:33 AM   LDLCALC 137 (H) 06/15/2017 10:33 AM   LDLDIRECT 69.0 04/01/2014 03:53 PM    Wt Readings from Last 3 Encounters:  02/02/18 226 lb 9.6 oz (102.8 kg)  01/28/18 230 lb (104.3 kg)  10/02/17 232 lb (105.2 kg)     Exam:    Vital Signs:  BP (!) 158/60 (BP Location: Left Arm, Patient Position: Sitting)   Pulse (!) 43    Well nourished, well developed male in no acute distress.   ASSESSMENT & PLAN:    1.  Bradycardia: -Uncertain if this is playing a role in his symptoms -Send out a 14-day Zio monitor to assess for high-grade AV block, significant pauses, and appropriate chronotropic response -If Zio monitor shows significant finding, refer to EP -Historically and on most recent 12-lead EKG there has been no evidence of high-grade AV block -Continue to avoid AV nodal blocking agents -It remains uncertain if he would be a candidate for possible PPM given his confused state, await input from neurology   2.   Chronic combined CHF/nonischemic stress-induced cardiomyopathy: -No recent weights -No symptoms of volume overload -Most recent echo showed improvement in LV systolic function -Remains only on ACEi given the above not on beta blocker, and in the setting of noncompliance and inability to adequately check labs not on spironolactone at this time. However, in follow up in person once COVID-19 distancing restrictions have decreased, consider starting spiro -Heart healthy diet low is sodium is recommended   3.  Nonobstructive CAD: -No symptoms consistent with angina -Remains on statin -Not on ASA previously, possibly in the setting of falls and noncompliance  -Aggressive risk factor modification   4.  PAF: -No symptoms concerning for palpitations  -Not on rate limiting medication in the setting of underlying bradycardia -Not on [place] secondary to history of frequent falls and now possible dementia   5.  Hypertension: -Blood pressure is suboptimally controlled today though compensating for his bradycardia  -Continue lisinopril   6.  Confusion: -Concern for possible underlying dementia -Has referral to neurology  COVID-19 Education: The signs and symptoms of COVID-19 were discussed with the patient and how to seek care for testing (follow up with PCP or arrange E-visit).  The importance of social distancing was discussed today.  Patient Risk:   After full review of this patients clinical status, I feel that they are at least moderate risk at this time.  Time:   Today, I have spent 31 minutes with the patient with telehealth technology discussing the above.     Medication Adjustments/Labs and Tests Ordered: Current medicines are reviewed at length with the patient today.  Concerns regarding medicines are outlined above.  Tests Ordered: No orders of the defined types were placed in this encounter.  Medication Changes: No orders of the defined types were placed in this encounter.    Disposition:  in 2 month(s)  Signed, Christell Faith, PA-C  04/03/2018 2:29 PM    [facility]

## 2018-04-03 ENCOUNTER — Encounter: Payer: Self-pay | Admitting: Physician Assistant

## 2018-04-03 ENCOUNTER — Other Ambulatory Visit: Payer: Self-pay

## 2018-04-03 ENCOUNTER — Telehealth (INDEPENDENT_AMBULATORY_CARE_PROVIDER_SITE_OTHER): Payer: Medicare HMO | Admitting: Physician Assistant

## 2018-04-03 VITALS — BP 158/60 | HR 45

## 2018-04-03 DIAGNOSIS — I10 Essential (primary) hypertension: Secondary | ICD-10-CM

## 2018-04-03 DIAGNOSIS — I2510 Atherosclerotic heart disease of native coronary artery without angina pectoris: Secondary | ICD-10-CM | POA: Diagnosis not present

## 2018-04-03 DIAGNOSIS — I110 Hypertensive heart disease with heart failure: Secondary | ICD-10-CM | POA: Diagnosis not present

## 2018-04-03 DIAGNOSIS — R410 Disorientation, unspecified: Secondary | ICD-10-CM

## 2018-04-03 DIAGNOSIS — Z79899 Other long term (current) drug therapy: Secondary | ICD-10-CM

## 2018-04-03 DIAGNOSIS — R001 Bradycardia, unspecified: Secondary | ICD-10-CM | POA: Diagnosis not present

## 2018-04-03 DIAGNOSIS — I428 Other cardiomyopathies: Secondary | ICD-10-CM | POA: Diagnosis not present

## 2018-04-03 DIAGNOSIS — I480 Paroxysmal atrial fibrillation: Secondary | ICD-10-CM

## 2018-04-03 DIAGNOSIS — I5042 Chronic combined systolic (congestive) and diastolic (congestive) heart failure: Secondary | ICD-10-CM | POA: Diagnosis not present

## 2018-04-03 NOTE — Patient Instructions (Signed)
It was a pleasure to speak with you on the phone today! Thank you for allowing [place] to continue taking care of your [facility] needs during this time.   Feel free to call as needed for questions and concerns related to your cardiac needs.   Medication Instructions:  Your physician recommends that you continue on your current medications as directed. Please refer to the Current Medication list given to you today.  If you need a refill on your cardiac medications before your next appointment, please call your pharmacy.   Lab work: None ordered  If you have labs (blood work) drawn today and your tests are completely normal, you will receive your results only by: Marland Kitchen MyChart Message (if you have MyChart) OR . A paper copy in the mail If you have any lab test that is abnormal or we need to change your treatment, we will call you to review the results.  Testing/Procedures: 1- Zio AT  Zio AT: We will place order and you will receive it in the mail.  You may get a call from [facility] @ either  [phone] Or  [phone] for them to confirm your address before it will be sent to you.  You will wear the monitor for 14 days, remove it and send it back to the company. They will send [place] a report. Then we will call you with the results.   Follow-Up: At [facility], you and your health needs are our priority.  As part of our continuing mission to provide you with exceptional heart care, we have created designated Provider Care Teams.  These Care Teams include your primary Cardiologist (physician) and Advanced Practice Providers (APPs -  Physician Assistants and Nurse Practitioners) who all work together to provide you with the care you need, when you need it. You will need a follow up appointment in 2-3 months. You may see Kathlyn Sacramento, MD or Christell Faith, PA-C.

## 2018-04-05 ENCOUNTER — Ambulatory Visit: Payer: Medicare HMO

## 2018-04-05 ENCOUNTER — Ambulatory Visit (INDEPENDENT_AMBULATORY_CARE_PROVIDER_SITE_OTHER): Payer: Medicare HMO

## 2018-04-05 DIAGNOSIS — I480 Paroxysmal atrial fibrillation: Secondary | ICD-10-CM

## 2018-04-05 DIAGNOSIS — R001 Bradycardia, unspecified: Secondary | ICD-10-CM

## 2018-04-06 ENCOUNTER — Telehealth: Payer: Self-pay | Admitting: Cardiovascular Disease

## 2018-04-06 NOTE — Telephone Encounter (Signed)
Noted. Await replacement.

## 2018-04-06 NOTE — Telephone Encounter (Signed)
FYI fwd to Dr. Fletcher Anon and his nurse Cristopher Peru., RN

## 2018-04-06 NOTE — Telephone Encounter (Signed)
[employer] calling to let us know patient called yesterday and told them he was having difficulties with gateway flashing, they will send out a replacement to patient Reference # [id number] .

## 2018-04-06 NOTE — Telephone Encounter (Signed)
Message routed to the provider who ordered the monitor and his nurse

## 2018-04-10 ENCOUNTER — Other Ambulatory Visit: Payer: Self-pay

## 2018-04-10 ENCOUNTER — Other Ambulatory Visit: Payer: Self-pay | Admitting: *Deleted

## 2018-04-10 DIAGNOSIS — R001 Bradycardia, unspecified: Secondary | ICD-10-CM

## 2018-04-13 ENCOUNTER — Telehealth: Payer: Self-pay | Admitting: Cardiovascular Disease

## 2018-04-13 NOTE — Telephone Encounter (Signed)
Left a message for the patient to call back.  

## 2018-04-13 NOTE — Telephone Encounter (Signed)
[facility] calling with a courtesy call Monitor was delivered on April 6th but it has not been applied They have attempted to contact the patient with no success

## 2018-04-14 DIAGNOSIS — R001 Bradycardia, unspecified: Secondary | ICD-10-CM | POA: Diagnosis not present

## 2018-04-16 ENCOUNTER — Other Ambulatory Visit: Payer: Self-pay | Admitting: Urology

## 2018-04-16 ENCOUNTER — Other Ambulatory Visit: Payer: Self-pay

## 2018-04-16 ENCOUNTER — Other Ambulatory Visit: Payer: Self-pay | Admitting: *Deleted

## 2018-04-16 ENCOUNTER — Other Ambulatory Visit: Payer: Self-pay | Admitting: Family Medicine

## 2018-04-16 ENCOUNTER — Telehealth: Payer: Self-pay | Admitting: Urology

## 2018-04-16 DIAGNOSIS — F419 Anxiety disorder, unspecified: Secondary | ICD-10-CM

## 2018-04-16 DIAGNOSIS — R300 Dysuria: Secondary | ICD-10-CM

## 2018-04-16 DIAGNOSIS — I480 Paroxysmal atrial fibrillation: Secondary | ICD-10-CM

## 2018-04-16 DIAGNOSIS — R001 Bradycardia, unspecified: Secondary | ICD-10-CM

## 2018-04-16 NOTE — Telephone Encounter (Signed)
Chase Simmons messaged [name] to please upload/ review strips with Dr. Rockey Situ (DOD).

## 2018-04-16 NOTE — Telephone Encounter (Signed)
Strips have been printed and placed on MD desk for review. He is currently doing virtual visits.

## 2018-04-16 NOTE — Telephone Encounter (Signed)
-----   Message from Minna Merritts, MD sent at 04/15/2018 10:55 AM EDT ----- Regarding: afib Received a phone call from [employer], ZIO Monitor showing 90 seconds of atrial fibrillation.  Monitor only placed a day or 2 ago Can we pull the strips up for review and give to MA Thx TG

## 2018-04-16 NOTE — Telephone Encounter (Signed)
Last OV 02/27/2018  Last refilled 03/14/2018 disp 20 with no refills   Next OV 06/04/2018  Sent to PCP for approval

## 2018-04-16 NOTE — Telephone Encounter (Signed)
I explained to [place], his daughter, that mental confusion is not a sign of an UTI.  She needs to rule out other considerations that may be contributing to his mental status change.  He is has DM, CHF, he could also be suffering with a stroke or experiencing low BP.  She then mentioned that he has been complaining of dysuria.  I stated we will check his urine, but she needs to also contact his other providers regarding his symptoms.  She then stated that his mental confusion has been present for weeks and he has an appointment with the neurologist tomorrow.

## 2018-04-16 NOTE — Telephone Encounter (Signed)
Strips reviewed by provider and then emailed to ordering provider as well. Pending complete report once patient completes monitoring period.

## 2018-04-17 ENCOUNTER — Other Ambulatory Visit: Payer: Self-pay | Admitting: *Deleted

## 2018-04-17 ENCOUNTER — Other Ambulatory Visit: Payer: Self-pay

## 2018-04-17 DIAGNOSIS — I480 Paroxysmal atrial fibrillation: Secondary | ICD-10-CM

## 2018-04-17 DIAGNOSIS — R410 Disorientation, unspecified: Secondary | ICD-10-CM | POA: Diagnosis not present

## 2018-04-17 DIAGNOSIS — R413 Other amnesia: Secondary | ICD-10-CM | POA: Diagnosis not present

## 2018-04-18 ENCOUNTER — Telehealth: Payer: Self-pay | Admitting: Cardiovascular Disease

## 2018-04-18 NOTE — Telephone Encounter (Signed)
Representative from [employer] with ZIO AT report. 04/17/18 11:55 pm Slow aFib at 37-38 bpm, lasted approx 60 sec. 04/18/18 03:53 am Slow aFib at 39 bpm, Last approx. 60 sec.   ZIO spoke with daughter who stated patient was asleep during this time. Follow up Heart rate is 56 bpm, patient still in aFib.   Printed strips and will show to the DOD, Dr End today as Dr Fletcher Anon is out of the office this week.

## 2018-04-18 NOTE — Telephone Encounter (Signed)
Strips reviewed, showing a-fib with slow ventricular response while asleep.  No pauses > 5 seconds.  If patient has been asymptomatic, no urgent intervention is needed.  Patient should f/u as previously discussed.  Nelva Bush, MD [facility] Pager: [phone]

## 2018-04-18 NOTE — Telephone Encounter (Signed)
[employer] calling with an abnormal EKG results  Transferred to [facility]

## 2018-04-18 NOTE — Telephone Encounter (Signed)
Patient has appointment with Dr Fletcher Anon on 6/2. Routing to Dr Fletcher Anon as an Juluis Rainier.

## 2018-04-19 ENCOUNTER — Telehealth: Payer: Self-pay | Admitting: Cardiovascular Disease

## 2018-04-19 NOTE — Telephone Encounter (Signed)
Spoke with the pt daughter [name] She sts that the pt behavior has become erratic, he is paranoid, does not recognize family, and he is not sleeping. The pt had a consult with Neurology and was prescribed Trazodone for sleep. Due to the pt issue with Bradycardia they are afraid to give it to him. [name] would like Dr.Arida or Thurmond Butts D., PA recommendation regarding the medication.  [name] also sts that they are considering taking the pt to the ED for evaluation due to his behavior. They have been reluctant due to COVID-19. They are not sure what else to do. Adv [name] that I will fwd an update to both providers, we will call back with their recommendation.

## 2018-04-19 NOTE — Telephone Encounter (Signed)
Pt c/o medication issue:  1. Name of Medication: triazadone   2. How are you currently taking this medication (dosage and times per day)?  Unknown   3. Are you having a reaction (difficulty breathing--STAT)? No   4. What is your medication issue? Daughter called to get advise on new rx from neurology .  Dr. Melrose Nakayama rxd for sleep and issues with possible dementia.  Daughter concerned about adding a 2nd anti depressant and patient current issues with brady ( received a call from [employer] at 1 am for an alert on monitor)

## 2018-04-19 NOTE — Telephone Encounter (Signed)
Spoke with the pt daughter [name] and made her aware of Dr.Arida's response. [name] verbalized understanding and voiced appreciation for the assistance.

## 2018-04-19 NOTE — Telephone Encounter (Signed)
Trazodone does not cause significant bradycardia. They can give to the patient.

## 2018-04-20 ENCOUNTER — Telehealth: Payer: Self-pay | Admitting: Internal Medicine

## 2018-04-20 ENCOUNTER — Other Ambulatory Visit: Payer: Self-pay

## 2018-04-20 ENCOUNTER — Emergency Department: Payer: Medicare HMO

## 2018-04-20 ENCOUNTER — Emergency Department
Admission: EM | Admit: 2018-04-20 | Discharge: 2018-04-24 | Disposition: A | Discharge status: Rehabilitation Facility | Payer: Medicare HMO | Attending: Emergency Medicine | Admitting: Emergency Medicine

## 2018-04-20 ENCOUNTER — Encounter: Payer: Self-pay | Admitting: Emergency Medicine

## 2018-04-20 DIAGNOSIS — Z79899 Other long term (current) drug therapy: Secondary | ICD-10-CM | POA: Diagnosis not present

## 2018-04-20 DIAGNOSIS — R404 Transient alteration of awareness: Secondary | ICD-10-CM | POA: Diagnosis not present

## 2018-04-20 DIAGNOSIS — Z794 Long term (current) use of insulin: Secondary | ICD-10-CM | POA: Diagnosis not present

## 2018-04-20 DIAGNOSIS — E119 Type 2 diabetes mellitus without complications: Secondary | ICD-10-CM | POA: Diagnosis not present

## 2018-04-20 DIAGNOSIS — R410 Disorientation, unspecified: Secondary | ICD-10-CM | POA: Diagnosis present

## 2018-04-20 DIAGNOSIS — G3184 Mild cognitive impairment, so stated: Secondary | ICD-10-CM | POA: Diagnosis not present

## 2018-04-20 DIAGNOSIS — F419 Anxiety disorder, unspecified: Secondary | ICD-10-CM | POA: Diagnosis not present

## 2018-04-20 DIAGNOSIS — R4182 Altered mental status, unspecified: Secondary | ICD-10-CM | POA: Insufficient documentation

## 2018-04-20 DIAGNOSIS — J449 Chronic obstructive pulmonary disease, unspecified: Secondary | ICD-10-CM | POA: Diagnosis not present

## 2018-04-20 DIAGNOSIS — Z8551 Personal history of malignant neoplasm of bladder: Secondary | ICD-10-CM | POA: Insufficient documentation

## 2018-04-20 DIAGNOSIS — R5381 Other malaise: Secondary | ICD-10-CM | POA: Diagnosis not present

## 2018-04-20 DIAGNOSIS — F329 Major depressive disorder, single episode, unspecified: Secondary | ICD-10-CM | POA: Diagnosis not present

## 2018-04-20 DIAGNOSIS — R195 Other fecal abnormalities: Secondary | ICD-10-CM | POA: Diagnosis not present

## 2018-04-20 DIAGNOSIS — Z87891 Personal history of nicotine dependence: Secondary | ICD-10-CM | POA: Diagnosis not present

## 2018-04-20 DIAGNOSIS — I110 Hypertensive heart disease with heart failure: Secondary | ICD-10-CM | POA: Diagnosis not present

## 2018-04-20 DIAGNOSIS — I5042 Chronic combined systolic (congestive) and diastolic (congestive) heart failure: Secondary | ICD-10-CM | POA: Diagnosis not present

## 2018-04-20 DIAGNOSIS — R001 Bradycardia, unspecified: Secondary | ICD-10-CM | POA: Diagnosis not present

## 2018-04-20 DIAGNOSIS — F32A Depression, unspecified: Secondary | ICD-10-CM | POA: Insufficient documentation

## 2018-04-20 DIAGNOSIS — R69 Illness, unspecified: Secondary | ICD-10-CM | POA: Diagnosis not present

## 2018-04-20 LAB — CBC WITH DIFFERENTIAL/PLATELET
Abs Immature Granulocytes: 0.01 10*3/uL (ref 0.00–0.07)
Basophils Absolute: 0.1 10*3/uL (ref 0.0–0.1)
Basophils Relative: 1 %
Eosinophils Absolute: 0.1 10*3/uL (ref 0.0–0.5)
Eosinophils Relative: 2 %
HCT: 36.6 % — ABNORMAL LOW (ref 39.0–52.0)
Hemoglobin: 12.3 g/dL — ABNORMAL LOW (ref 13.0–17.0)
Immature Granulocytes: 0 %
Lymphocytes Relative: 30 %
Lymphs Abs: 2.1 10*3/uL (ref 0.7–4.0)
MCH: 34.9 pg — ABNORMAL HIGH (ref 26.0–34.0)
MCHC: 33.6 g/dL (ref 30.0–36.0)
MCV: 104 fL — ABNORMAL HIGH (ref 80.0–100.0)
Monocytes Absolute: 0.7 10*3/uL (ref 0.1–1.0)
Monocytes Relative: 10 %
Neutro Abs: 4.1 10*3/uL (ref 1.7–7.7)
Neutrophils Relative %: 57 %
Platelets: 178 10*3/uL (ref 150–400)
RBC: 3.52 MIL/uL — ABNORMAL LOW (ref 4.22–5.81)
RDW: 12.2 % (ref 11.5–15.5)
WBC: 7 10*3/uL (ref 4.0–10.5)
nRBC: 0 % (ref 0.0–0.2)

## 2018-04-20 LAB — URINALYSIS, COMPLETE (UACMP) WITH MICROSCOPIC
Bacteria, UA: NONE SEEN
Bilirubin Urine: NEGATIVE
Glucose, UA: 50 mg/dL — AB
Hgb urine dipstick: NEGATIVE
Ketones, ur: NEGATIVE mg/dL
Leukocytes,Ua: NEGATIVE
Nitrite: NEGATIVE
Protein, ur: NEGATIVE mg/dL
Specific Gravity, Urine: 1.021 (ref 1.005–1.030)
pH: 5 (ref 5.0–8.0)

## 2018-04-20 LAB — COMPREHENSIVE METABOLIC PANEL
ALT: 22 U/L (ref 0–44)
AST: 16 U/L (ref 15–41)
Albumin: 3.5 g/dL (ref 3.5–5.0)
Alkaline Phosphatase: 68 U/L (ref 38–126)
Anion gap: 5 (ref 5–15)
BUN: 36 mg/dL — ABNORMAL HIGH (ref 8–23)
CO2: 28 mmol/L (ref 22–32)
Calcium: 9.1 mg/dL (ref 8.9–10.3)
Chloride: 105 mmol/L (ref 98–111)
Creatinine, Ser: 0.86 mg/dL (ref 0.61–1.24)
GFR calc Af Amer: >60 mL/min (ref >60)
GFR calc non Af Amer: >60 mL/min (ref >60)
Glucose, Bld: 209 mg/dL — ABNORMAL HIGH (ref 70–99)
Potassium: 3.8 mmol/L (ref 3.5–5.1)
Sodium: 138 mmol/L (ref 135–145)
Total Bilirubin: 0.6 mg/dL (ref 0.3–1.2)
Total Protein: 6.5 g/dL (ref 6.5–8.1)

## 2018-04-20 LAB — AMMONIA: Ammonia: <9 umol/L — ABNORMAL LOW (ref 9–35)

## 2018-04-20 LAB — GLUCOSE, CAPILLARY: Glucose-Capillary: 194 mg/dL — ABNORMAL HIGH (ref 70–99)

## 2018-04-20 LAB — TSH: TSH: 1.867 u[IU]/mL (ref 0.350–4.500)

## 2018-04-20 MED ORDER — TIOTROPIUM BROMIDE MONOHYDRATE 18 MCG IN CAPS
18.0000 ug | ORAL_CAPSULE | Freq: Every day | RESPIRATORY_TRACT | Status: DC
  Administered 2018-04-21 – 2018-04-24 (×4): 18 ug via RESPIRATORY_TRACT
  Filled 2018-04-20: qty 5

## 2018-04-20 MED ORDER — POLYETHYLENE GLYCOL 3350 17 G PO PACK
17.0000 g | PACK | Freq: Two times a day (BID) | ORAL | Status: DC
  Administered 2018-04-20 – 2018-04-24 (×6): 17 g via ORAL
  Filled 2018-04-20 (×7): qty 1

## 2018-04-20 MED ORDER — GLIMEPIRIDE 4 MG PO TABS
4.0000 mg | ORAL_TABLET | Freq: Every day | ORAL | Status: DC
  Administered 2018-04-21 – 2018-04-24 (×4): 4 mg via ORAL
  Filled 2018-04-20 (×4): qty 1

## 2018-04-20 MED ORDER — LORAZEPAM 2 MG/ML IJ SOLN
1.0000 mg | Freq: Once | INTRAMUSCULAR | Status: AC
  Administered 2018-04-20: 19:00:00 1 mg via INTRAVENOUS
  Filled 2018-04-20: qty 1

## 2018-04-20 MED ORDER — MECLIZINE HCL 25 MG PO TABS: 12.5000 mg | ORAL_TABLET | Freq: Three times a day (TID) | ORAL | Status: DC | PRN

## 2018-04-20 MED ORDER — ATORVASTATIN CALCIUM 20 MG PO TABS
80.0000 mg | ORAL_TABLET | Freq: Every day | ORAL | Status: DC
  Administered 2018-04-20 – 2018-04-23 (×4): 80 mg via ORAL
  Filled 2018-04-20 (×4): qty 4

## 2018-04-20 MED ORDER — LORAZEPAM 2 MG/ML IJ SOLN
0.5000 mg | Freq: Once | INTRAMUSCULAR | Status: AC
  Administered 2018-04-20: 14:00:00 0.5 mg via INTRAVENOUS
  Filled 2018-04-20: qty 1

## 2018-04-20 MED ORDER — PRIMIDONE 50 MG PO TABS
50.0000 mg | ORAL_TABLET | Freq: Two times a day (BID) | ORAL | Status: DC
  Administered 2018-04-21 – 2018-04-24 (×8): 50 mg via ORAL
  Filled 2018-04-20 (×10): qty 1

## 2018-04-20 MED ORDER — METFORMIN HCL 500 MG PO TABS
500.0000 mg | ORAL_TABLET | Freq: Two times a day (BID) | ORAL | Status: DC
  Administered 2018-04-21 – 2018-04-24 (×7): 500 mg via ORAL
  Filled 2018-04-20 (×7): qty 1

## 2018-04-20 MED ORDER — FLUTICASONE PROPIONATE 50 MCG/ACT NA SUSP
2.0000 | Freq: Every day | NASAL | Status: DC
  Administered 2018-04-20 – 2018-04-24 (×5): 2 via NASAL
  Filled 2018-04-20: qty 16

## 2018-04-20 MED ORDER — DIPHENHYDRAMINE HCL 50 MG/ML IJ SOLN
25.0000 mg | Freq: Once | INTRAMUSCULAR | Status: AC
  Administered 2018-04-20: 14:00:00 25 mg via INTRAVENOUS
  Filled 2018-04-20: qty 1

## 2018-04-20 MED ORDER — PANTOPRAZOLE SODIUM 40 MG PO TBEC
40.0000 mg | DELAYED_RELEASE_TABLET | Freq: Every day | ORAL | Status: DC
  Administered 2018-04-20 – 2018-04-24 (×5): 40 mg via ORAL
  Filled 2018-04-20 (×5): qty 1

## 2018-04-20 MED ORDER — SODIUM CHLORIDE 0.9 % IV SOLN
Freq: Once | INTRAVENOUS | Status: AC
  Administered 2018-04-20: 13:00:00 via INTRAVENOUS

## 2018-04-20 MED ORDER — LISINOPRIL 10 MG PO TABS
20.0000 mg | ORAL_TABLET | Freq: Every day | ORAL | Status: DC
  Administered 2018-04-20 – 2018-04-21 (×2): 20 mg via ORAL
  Filled 2018-04-20 (×2): qty 2

## 2018-04-20 MED ORDER — ACETAMINOPHEN 325 MG PO TABS
650.0000 mg | ORAL_TABLET | Freq: Four times a day (QID) | ORAL | Status: DC | PRN
  Administered 2018-04-21: 650 mg via ORAL
  Filled 2018-04-20: qty 2

## 2018-04-20 MED ORDER — PAROXETINE HCL 20 MG PO TABS
40.0000 mg | ORAL_TABLET | Freq: Every day | ORAL | Status: DC
  Administered 2018-04-21 – 2018-04-24 (×5): 40 mg via ORAL
  Filled 2018-04-20 (×5): qty 2

## 2018-04-20 MED ORDER — SORBITOL 70 % SOLN
960.0000 mL | TOPICAL_OIL | Freq: Once | ORAL | Status: AC
  Administered 2018-04-22: 960 mL via RECTAL
  Filled 2018-04-20: qty 473

## 2018-04-20 NOTE — Evaluation (Signed)
Physical Therapy Evaluation Patient Details Name: Chase Simmons MRN: [id number] DOB: [birth year] Today's Date: 04/20/2018   History of Present Illness  85 y/o male here with increased confusion, and generally weakness.    Clinical Impression  Pt was willing to participate with PT but had too much pain (and ROM limitations, etc) in b/l knees to be able to take even a small side shuffle step.  He was leaning back of legs on EOB much of the time in standing and despite cuing and assist was unable to confidently stand, much less step.  He was able to do some small lateral scoots at EOB, but functionally is very limited compared to baseline (apparently until recently he was up and walking ad lib with walker and no assist).  Pt unsafe to return home secondary to decrease in mobility and cognitive issues, will require STR to get back to a more functional level.     Follow Up Recommendations SNF    Equipment Recommendations  None recommended by PT    Recommendations for Other Services       Precautions / Restrictions Precautions Precautions: Fall Restrictions Weight Bearing Restrictions: No      Mobility  Bed Mobility Overal bed mobility: Needs Assistance Bed Mobility: Supine to Sit;Sit to Supine     Supine to sit: Mod assist;Min assist Sit to supine: Mod assist   General bed mobility comments: Pt able assist minimally with getting to EOB, but ultimately did need considerable assist to attain sitting  Transfers Overall transfer level: Needs assistance Equipment used: Rolling walker (2 wheeled) Transfers: Sit to/from Stand;Lateral/Scoot Transfers Sit to Stand: Min assist;Mod assist        Lateral/Scoot Transfers: Min guard General transfer comment: Pt initially c/o too much pain to stand, with cuing/assist/encouragement he was able to rise but could not get fully upright of straighten knees.  Pt was able to do a few very limited lateral scoots at EOB w/o assist,    Ambulation/Gait             General Gait Details: Pt unable to take even a very small side step along EOB with plenty of cuing and encouragement.  Attempts to unweight pt to assist were unsuccessful.  Stairs            Wheelchair Mobility    Modified Rankin (Stroke Patients Only)       Balance Overall balance assessment: Needs assistance Sitting-balance support: Single extremity supported Sitting balance-Leahy Scale: Good Sitting balance - Comments: Pt able to maintain sitting balance confidently     Standing balance-Leahy Scale: Poor Standing balance comment: Pt highly reliant on walker, unable to extend knees/attain upright posture.                             Pertinent Vitals/Pain Pain Assessment: (c/o knee pain t/o the session, unrated but limiting)    Home Living Family/patient expects to be discharged to:: Skilled nursing facility                 Additional Comments: per notes pt lives alone in apartment complex, daughter's help with errands    Prior Function Level of Independence: Independent with assistive device(s)         Comments: Apparently until recently pt has been able to ambulate ad lib with walker     Hand Dominance        Extremity/Trunk Assessment   Upper Extremity Assessment Upper Extremity  Assessment: Generalized weakness(no R shoulder elevation, grossly 3+/5 otherwise )    Lower Extremity Assessment Lower Extremity Assessment: (lacks b/l knee TKE, knee pain with resisted ext, 3+ to 4-/5)       Communication   Communication: No difficulties  Cognition Arousal/Alertness: Awake/alert Behavior During Therapy: Restless Overall Cognitive Status: Impaired/Different from baseline                                 General Comments: Pt states name and birthday, does not know date, can't answer most history questions but knows he's in the hospital      General Comments      Exercises      Assessment/Plan    PT Assessment Patient needs continued PT services  PT Problem List Decreased strength;Decreased range of motion;Decreased balance;Decreased activity tolerance;Decreased mobility;Decreased cognition;Decreased knowledge of use of DME;Decreased safety awareness;Pain       PT Treatment Interventions DME instruction;Gait training;Functional mobility training;Therapeutic activities;Balance training;Therapeutic exercise;Neuromuscular re-education;Cognitive remediation;Patient/family education    PT Goals (Current goals can be found in the Care Plan section)  Acute Rehab PT Goals Patient Stated Goal: get the pain out of his knees PT Goal Formulation: With patient Time For Goal Achievement: 05/04/18 Potential to Achieve Goals: Fair    Frequency Min 2X/week   Barriers to discharge        Co-evaluation               AM-PAC PT "6 Clicks" Mobility  Outcome Measure Help needed turning from your back to your side while in a flat bed without using bedrails?: A Little Help needed moving from lying on your back to sitting on the side of a flat bed without using bedrails?: A Lot Help needed moving to and from a bed to a chair (including a wheelchair)?: A Lot Help needed standing up from a chair using your arms (e.g., wheelchair or bedside chair)?: A Little Help needed to walk in hospital room?: A Lot Help needed climbing 3-5 steps with a railing? : Total 6 Click Score: 13    End of Session Equipment Utilized During Treatment: Gait belt Activity Tolerance: Patient limited by pain Patient left: in bed;with call bell/phone within reach;with nursing/sitter in room Nurse Communication: Mobility status PT Visit Diagnosis: Muscle weakness (generalized) (M62.81);Difficulty in walking, not elsewhere classified (R26.2);Pain Pain - Right/Left: Right Pain - part of body: Knee    Time: 6270-3500 PT Time Calculation (min) (ACUTE ONLY): 31 min   Charges:   PT  Evaluation $PT Eval Low Complexity: 1 Low PT Treatments $Therapeutic Activity: 8-22 mins        Kreg Shropshire, DPT 04/20/2018, 5:16 PM

## 2018-04-20 NOTE — ED Provider Notes (Addendum)
-----------------------------------------   7:07 PM on 04/20/2018 -----------------------------------------  And out to me this afternoon by Dr. Rip Harbour.  Patient medically cleared, has had a gradual progression of inability to live at home and was sent to the emergency room for further evaluation.  No acute medical issues found on exhaustive work-up.  He is therefore medically cleared for social work disposition hopefully to a assisted-living facility or something of that nature.  Patient remains no acute distress he does frequently try to climb out of bed and I am worried he may injure himself we have instituted fall protection policies. He is agitated that we are asking him not to try to jump out of bed.    Schuyler Amor, MD 04/20/18 1910    Schuyler Amor, MD 04/20/18 [phone]

## 2018-04-20 NOTE — ED Triage Notes (Signed)
Pt to ED via [facility] from home for Altered Mental Status x 2 months, worse over the past 2 days. Pt does seem to be altered on arrival to ED, pt asking EDP why he will not do surgery on his knees. Pt is bradycardic, per EMS dautgher reports that this is baseline for him. Pt is in NAD.

## 2018-04-20 NOTE — ED Notes (Signed)
Patient is resting comfortably; no agitation noted at this time. Gave miralax instead of SMOG enema, ok'd per MD. Bed alarm on, fall mats surrounding bed.

## 2018-04-20 NOTE — ED Provider Notes (Signed)
[facility] Emergency Department Provider Note   ____________________________________________   First MD Initiated Contact with Patient 04/20/18 1039     (approximate)  I have reviewed the triage vital signs and the nursing notes.   HISTORY  Chief Complaint Altered Mental Status   HPI Chase Simmons is a 85 y.o. male who lives by himself who was reportedly having increasing confusion.  He is also wandering.  Is not acting like himself at all.  By history obtained from EMS and review of the old records this appears to be gradually worsening.  Patient complains only of his back itching and at times says his knees hurt.  He asked for a gallon of vinegar so he can I think put it on his back to help the itching.  Himself has no other complaints.  He has seen his primary care provider and neurology both.       Past Medical History:  Diagnosis Date  . Allergy   . Bladder cancer ([place])    a. followed by Dr. Jacqlyn Larsen  . Chronic combined systolic (congestive) and diastolic (congestive) heart failure ([place])    a. 07/2016 Echo: EF 30-35%, sev mid-apicalanteroseptal, ant, inf HK, Gr1 DD.  Marland Kitchen COPD (chronic obstructive pulmonary disease) ([place])   . Hyperlipidemia   . Hypertension   . NICM (nonischemic cardiomyopathy) ([place])    a. 07/2016 Echo: EF 30-35%, sev mid-apicalanteroseptal, ant, inf HK, Gr1 DD, mild MR, mildly dil LA, PASP 82mHg - ? stress induced CM.  .Marland KitchenNonobstructive Coronary atherosclerosis    a. 07/2016 Cath: LM nl, LAD nl, D1 40ost, LCX 240mRCA nl, EF 25-30%.  . Marland KitchenAF (paroxysmal atrial fibrillation) ([facility] [place]   a. Dx 08/2013. CHA2DS2VASc = 6-->no OAC 2/2 h/o falls and nocompliance.  . Tremor, essential    sees [facility] neurologist  . Type 2 diabetes mellitus with complication, without long-term current use of insulin ([facility]7/30/2014  . Urinary incontinence     Patient Active Problem List   Diagnosis Date Noted  . Frequency of urination 03/01/2018  . Confusion  03/01/2018  . Bilateral leg edema 11/08/2016  . Vertigo 08/25/2016  . Nausea 08/01/2016  . CHF (congestive heart failure) ([facility]07/30/2018  . Atypical chest pain 07/20/2016  . COPD (chronic obstructive pulmonary disease) ([facility]03/13/2018  . Coronary atherosclerosis of native coronary artery 01/05/2016  . Aortic atherosclerosis ([facility]01/02/2016  . Bradycardia 01/05/2016  . Anxiety 06/17/2015  . Malignant neoplasm of urinary bladder ([facility]06/14/2017  . Allergic rhinitis 05/14/2015  . Knee pain, bilateral 03/16/2015  . PAF (paroxysmal atrial fibrillation) ([facility]08/28/2015  . Acid reflux 08/30/2013  . Tremor, essential 10/22/2012  . Hyperlipidemia 10/22/2012  . HTN (hypertension) 08/01/2012  . Type 2 diabetes mellitus with complication, without long-term current use of insulin ([facility]07/30/2014  . Benign prostatic hyperplasia with urinary obstruction 09/02/2011    Past Surgical History:  Procedure Laterality Date  . bladder cancer    . CATARACT EXTRACTION    . HERNIA REPAIR    . LEFT HEART CATH AND CORONARY ANGIOGRAPHY N/A 07/11/2016   Procedure: Left Heart Cath and Coronary Angiography;  Surgeon: ArWellington HampshireMD;  [facility];  Service: Cardiovascular;  Laterality: N/A;    Prior to Admission medications   Medication Sig Start Date End Date Taking? Authorizing Provider  acetaminophen (TYLENOL) 325 MG tablet Take 2 tablets (650 mg total) by mouth every 6 (six) hours as needed for mild pain (or Fever >/=  101). 06/22/15   Gouru, Illene Silver, MD  ALPRAZolam (XANAX) 0.25 MG tablet TAKE 1 TABLET (0.25 MG TOTAL) BY MOUTH 2 (TWO) TIMES DAILY AS NEEDED FOR ANXIETY OR SLEEP 04/16/18   Leone Haven, MD  atorvastatin (LIPITOR) 80 MG tablet TAKE 1 TABLET ONE TIME DAILY 06/15/17   Leone Haven, MD  blood glucose meter kit and supplies KIT Dispense based on patient and insurance preference. Use up to four times daily as directed. 08/01/16   Coral Spikes, DO  fluticasone  (FLONASE) 50 MCG/ACT nasal spray Place 2 sprays into both nostrils daily. 02/27/18   Leone Haven, MD  glimepiride (AMARYL) 4 MG tablet take 1 tablet by mouth WITH BREAKFAST. 06/15/17   Leone Haven, MD  lisinopril (PRINIVIL,ZESTRIL) 20 MG tablet Take 1 tablet (20 mg total) by mouth daily. 06/15/17   Leone Haven, MD  meclizine (ANTIVERT) 12.5 MG tablet Take 1 tablet (12.5 mg total) by mouth 3 (three) times daily as needed for dizziness. 01/08/18   Jodelle Green, FNP  metFORMIN (GLUCOPHAGE) 500 MG tablet TAKE 1 TABLET BY MOUTH TWICE A DAY 07/20/17   Leone Haven, MD  pantoprazole (PROTONIX) 40 MG tablet Take 1 tablet (40 mg total) by mouth daily. 06/15/17   Leone Haven, MD  PARoxetine (PAXIL) 40 MG tablet Take 1 tablet (40 mg total) by mouth daily. 01/08/18   Guse, Jacquelynn Cree, FNP  primidone (MYSOLINE) 50 MG tablet Take 1.5 tablets (75 mg total) by mouth 2 (two) times daily. Patient taking differently: Take 50 mg by mouth 2 (two) times daily.  01/08/18   Jodelle Green, FNP  tiotropium (SPIRIVA) 18 MCG inhalation capsule Place 18 mcg as needed into inhaler and inhale.     [provider]    Allergies Latex  Family History  Problem Relation Age of Onset  . Arthritis Mother   . Dementia Mother   . Cancer Maternal Uncle        prostate  . Cancer Maternal Aunt        breast cancer    Social History Social History   Tobacco Use  . Smoking status: Former Smoker    Packs/day: 1.50    Years: 40.00    Pack years: 60.00    Last attempt to quit: 02/03/2003    Years since quitting: 15.2  . Smokeless tobacco: Never Used  Substance Use Topics  . Alcohol use: Yes    Alcohol/week: 12.0 standard drinks    Types: 12 Standard drinks or equivalent per week    Frequency: Never    Comment: 1 drink with dinner  . Drug use: No    Review of Systems  Constitutional: No fever/chills Eyes: No visual changes. ENT: No sore throat. Cardiovascular: Denies chest  pain. Respiratory: Denies shortness of breath. Gastrointestinal: No abdominal pain.  No nausea, no vomiting.  No diarrhea.  No constipation. Genitourinary: Negative for dysuria. Musculoskeletal: Negative for back pain. Skin: Negative for rash. Neurological: Negative for headaches, focal weakness   ____________________________________________   PHYSICAL EXAM:  VITAL SIGNS: ED Triage Vitals [04/20/18 1045]  Enc Vitals Group     BP (!) 175/109     Pulse Rate (!) 57     Resp 16     Temp 98.1 F (36.7 C)     Temp Source Oral     SpO2 100 %     Weight      Height      Head Circumference  Peak Flow      Pain Score      Pain Loc      Pain Edu?      Excl. in [place]?     Constitutional: Alert and oriented. Well appearing and in no acute distress. Eyes: Conjunctivae are normal.  Head: Atraumatic. Nose: No congestion/rhinnorhea. Mouth/Throat: Mucous membranes are moist.  Oropharynx non-erythematous. Neck: No stridor.  Cardiovascular: Normal rate, regular rhythm. Grossly normal heart sounds.  Good peripheral circulation. Respiratory: Normal respiratory effort.  No retractions. Lungs CTAB. Gastrointestinal: Soft and nontender. No distention. No abdominal bruits.  Musculoskeletal: No lower extremity tenderness nor edema.  Neurologic:  Normal speech and language. No gross focal neurologic deficits are appreciated.  Skin:  Skin is warm, dry and intact. No rash noted.  ____________________________________________   LABS (all labs ordered are listed, but only abnormal results are displayed)  Labs Reviewed  COMPREHENSIVE METABOLIC PANEL - Abnormal; Notable for the following components:      Result Value   Glucose, Bld 209 (*)    BUN 36 (*)    All other components within normal limits  CBC WITH DIFFERENTIAL/PLATELET - Abnormal; Notable for the following components:   RBC 3.52 (*)    Hemoglobin 12.3 (*)    HCT 36.6 (*)    MCV 104.0 (*)    MCH 34.9 (*)    All other components  within normal limits  URINALYSIS, COMPLETE (UACMP) WITH MICROSCOPIC - Abnormal; Notable for the following components:   Color, Urine YELLOW (*)    APPearance CLEAR (*)    Glucose, UA 50 (*)    All other components within normal limits  VITAMIN B12  FOLATE RBC  TSH   ____________________________________________  EKG  EKG read interpreted by me shows sinus bradycardia rate of 51 left axis nonspecific ST-T wave changes ____________________________________________  RADIOLOGY  ED MD interpretation: CT of the head and neck read by radiology reviewed by me shows no acute disease  Official radiology report(s): Ct Head Wo Contrast  Result Date: 04/20/2018 CLINICAL DATA:  85 year old male with altered mental status for 2 months EXAM: CT HEAD WITHOUT CONTRAST TECHNIQUE: Contiguous axial images were obtained from the base of the skull through the vertex without intravenous contrast. COMPARISON:  01/28/2018 FINDINGS: Brain: No acute intracranial hemorrhage. No midline shift or mass effect. Gray-white differentiation maintained. Confluent hypodensity in the bilateral periventricular white matter is unchanged from the comparison. Volume loss similar to prior. Unremarkable ventricles. Vascular: Minimal intracranial atherosclerosis. Skull: No fracture or aggressive bony lesions. Sinuses/Orbits: No acute finding. Other: None. IMPRESSION: Negative for acute intracranial abnormality. Chronic microvascular ischemic disease and senescent brain volume loss. Electronically Signed   By: Corrie Mckusick D.O.   On: 04/20/2018 11:15   Dg Chest Portable 1 View  Result Date: 04/20/2018 CLINICAL DATA:  85 year old male with altered mental status EXAM: PORTABLE CHEST 1 VIEW COMPARISON:  12/31/2016 FINDINGS: Cardiomediastinal silhouette unchanged in size and contour. Interval placement of event recorder on the left chest wall. No pneumothorax or pleural effusion. Low lung volumes with coarsened interstitial markings,  similar to the prior. No confluent airspace disease. IMPRESSION: Negative for acute cardiopulmonary disease. Interval placement of event recorder on the left chest wall Electronically Signed   By: Corrie Mckusick D.O.   On: 04/20/2018 11:04    ____________________________________________   PROCEDURES  Procedure(s) performed (including Critical Care):  Procedures   ____________________________________________   INITIAL IMPRESSION / ASSESSMENT AND PLAN / ED COURSE  Patient with increasing confusion.  No  apparent source.  He does have macrocytic changes on his CBC.  I have sent a B12 and folate.  I will also call social work to see if we should get him placed.  He does not appear to be competent at this time.  I have consulted psychiatry to confirm this for me.   Daughter wishes to get him into a memory care unit. If at all possible.    ----------------------------------------- 1:43 PM on 04/20/2018 -----------------------------------------  Patient is complaining of itching still.  We will try a little bit of Benadryl for him and then give him some Ativan as he is also getting a little anxious and slightly aggressive.      ____________________________________________   FINAL CLINICAL IMPRESSION(S) / ED DIAGNOSES  Final diagnoses:  Altered mental status, unspecified altered mental status type     ED Discharge Orders    None       Note:  This document was prepared using Dragon voice recognition software and may include unintentional dictation errors.    Nena Polio, MD 04/20/18 1343

## 2018-04-20 NOTE — ED Notes (Signed)
RN has been attempting to contact social work for patient placement. RN has left voicemail asking for return phone call. MD notified.

## 2018-04-20 NOTE — Telephone Encounter (Signed)
Spoke with the pt daughter [name]. Adv her that the we recommend that our pt avoid OTC medications with "D" component. Recommended that she discuss further OTC options with the pt pcp. [name] verbalized understanding and voiced appreciation for the call.

## 2018-04-20 NOTE — ED Notes (Signed)
This tech at pt bedside for safety reasons

## 2018-04-20 NOTE — Telephone Encounter (Signed)
Patient daughter [name] calling Family would like to know what medication patient is allowed to take for sinus problems Patient has been taking Flonase but it is not helping and patient is taking too much of it  Please call [name] at [phone] to discuss

## 2018-04-20 NOTE — ED Notes (Signed)
Patient transported to X-ray 

## 2018-04-20 NOTE — ED Notes (Signed)
Patient calm and resting. Will hold PO meds until patient awake. Patient repositioned and sheet changed; condom cath replaced

## 2018-04-20 NOTE — Progress Notes (Signed)
Patient resting comfortably at this time.

## 2018-04-20 NOTE — NC FL2 (Signed)
[place] LEVEL OF CARE SCREENING TOOL     IDENTIFICATION  Patient Name: Chase Simmons Birthdate: 23-Sep-1933 Sex: male Admission Date (Current Location): 04/20/2018  [place] and [place] Number:  Engineering geologist and Address:  [facility], [address], [place], [place] [address]      Provider Number: [id number]  Attending Physician Name and Address:  Nena Polio, MD  Relative Name and Phone Number:  Dajuan, Turnley    Daughter   [phone]     Current Level of Care: Hospital Recommended Level of Care: [place] Prior Approval Number:    Date Approved/Denied:   PASRR Number: [id number]  Discharge Plan: SNF    Current Diagnoses: Patient Active Problem List   Diagnosis Date Noted  . Frequency of urination 03/01/2018  . Confusion 03/01/2018  . Bilateral leg edema 11/08/2016  . Vertigo 08/25/2016  . Nausea 08/01/2016  . CHF (congestive heart failure) ([place]) 08/01/2016  . Atypical chest pain 07/20/2016  . COPD (chronic obstructive pulmonary disease) ([place]) 03/15/2016  . Coronary atherosclerosis of native coronary artery 01/05/2016  . Aortic atherosclerosis ([place]) 01/05/2016  . Bradycardia 01/05/2016  . Anxiety 06/17/2015  . Malignant neoplasm of urinary bladder ([place]) 06/17/2015  . Allergic rhinitis 05/14/2015  . Knee pain, bilateral 03/16/2015  . PAF (paroxysmal atrial fibrillation) ([place]) 08/30/2013  . Acid reflux 08/30/2013  . Tremor, essential 10/22/2012  . Hyperlipidemia 10/22/2012  . HTN (hypertension) 08/01/2012  . Type 2 diabetes mellitus with complication, without long-term current use of insulin ([place]) 08/01/2012  . Benign prostatic hyperplasia with urinary obstruction 09/02/2011    Orientation RESPIRATION BLADDER Height & Weight     Self  Normal Incontinent, External catheter Weight:   Height:     BEHAVIORAL SYMPTOMS/MOOD NEUROLOGICAL BOWEL NUTRITION STATUS      Continent Diet  AMBULATORY  STATUS COMMUNICATION OF NEEDS Skin   Extensive Assist Verbally Normal, Other (Comment)(rash on back, uses ointment)                       Personal Care Assistance Level of Assistance  Bathing, Feeding, Dressing Bathing Assistance: Limited assistance Feeding assistance: Independent Dressing Assistance: Limited assistance     Functional Limitations Info  Sight, Hearing, Speech Sight Info: Adequate Hearing Info: Adequate Speech Info: Adequate    SPECIAL CARE FACTORS FREQUENCY  PT (By licensed PT), OT (By licensed OT)     PT Frequency: 5x per week OT Frequency: 5x per week            Contractures Contractures Info: Not present    Additional Factors Info                  Current Medications (04/20/2018):  This is the current hospital active medication list No current facility-administered medications for this encounter.    Current Outpatient Medications  Medication Sig Dispense Refill  . acetaminophen (TYLENOL) 325 MG tablet Take 2 tablets (650 mg total) by mouth every 6 (six) hours as needed for mild pain (or Fever >/= 101).    Marland Kitchen ALPRAZolam (XANAX) 0.25 MG tablet TAKE 1 TABLET (0.25 MG TOTAL) BY MOUTH 2 (TWO) TIMES DAILY AS NEEDED FOR ANXIETY OR SLEEP 20 tablet 0  . atorvastatin (LIPITOR) 80 MG tablet TAKE 1 TABLET ONE TIME DAILY 90 tablet 3  . blood glucose meter kit and supplies KIT Dispense based on patient and insurance preference. Use up to four times daily as directed. 1 each 0  .  fluticasone (FLONASE) 50 MCG/ACT nasal spray Place 2 sprays into both nostrils daily. 16 g 6  . glimepiride (AMARYL) 4 MG tablet take 1 tablet by mouth WITH BREAKFAST. 90 tablet 1  . lisinopril (PRINIVIL,ZESTRIL) 20 MG tablet Take 1 tablet (20 mg total) by mouth daily. 90 tablet 1  . meclizine (ANTIVERT) 12.5 MG tablet Take 1 tablet (12.5 mg total) by mouth 3 (three) times daily as needed for dizziness. 30 tablet 2  . metFORMIN (GLUCOPHAGE) 500 MG tablet TAKE 1 TABLET BY MOUTH TWICE  A DAY 180 tablet 2  . pantoprazole (PROTONIX) 40 MG tablet Take 1 tablet (40 mg total) by mouth daily. 90 tablet 1  . PARoxetine (PAXIL) 40 MG tablet Take 1 tablet (40 mg total) by mouth daily. 90 tablet 1  . primidone (MYSOLINE) 50 MG tablet Take 1.5 tablets (75 mg total) by mouth 2 (two) times daily. (Patient taking differently: Take 50 mg by mouth 2 (two) times daily. ) 270 tablet 1  . tiotropium (SPIRIVA) 18 MCG inhalation capsule Place 18 mcg as needed into inhaler and inhale.        Discharge Medications: Please see discharge summary for a list of discharge medications.  Relevant Imaging Results:  Relevant Lab Results:   Additional Information SS# [id number]  Latanya Maudlin, RN

## 2018-04-20 NOTE — ED Notes (Signed)
Pt sitting up in bed at this time eating a [place] sandwich tray with assistance from this tech

## 2018-04-20 NOTE — Progress Notes (Deleted)
Competency request from psychiatry is not possible as this is a legal/court decision.  If he needs capacity, any provider can do capacity but not competency.    Waylan Boga, PMHNP

## 2018-04-20 NOTE — ED Notes (Signed)
Patient slightly agitated and is confused. RN applied condom cath for incontinence, repositioned patient for comfort, applied lotion to patient's back for itching, and gave Ativan/Benadryl. Fall mats are placed.

## 2018-04-20 NOTE — Consult Note (Addendum)
[place] Psychiatry Consult   Reason for Consult:  "competency" Referring Physician:  EDP Patient Identification: DIRON HADDON MRN:  [id number] Principal Diagnosis:Confusion, altered mental status Diagnosis:  Altered mental status  Total Time spent with patient: 45 minutes  Subjective:   MOHAMEDAMIN NIFONG is a 85 y.o. male patient admitted with confusion and wandering.  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  Assessed for capacity, Mr Artola does not have the mental capacity at this time to make safe medical decisions based on his state of confusion.  HPI:  85 yo IVC'd by his daughter for increase in confusion needing a higher level of care than his current independent living facility.  His increase in confusion started two weeks ago until the point of wandering.  He knew his name "Edvardo Honse" but did not know where he was or what type of facility.  "If you tell me, I will know."  Mr [place] could identify that it was April and Trump was president.  When asked about the reason he was brought to the hospital, he stated, "That hysterical girl."  Reports he went to the terminal last night and there were many doors.  He didn't know which one to use so he knocked on one and a lady helped him.  "I was lost."  Identifies the terminal as an "overnight" terminal.  Also, explains that his daughter is upset because "I went to a women's apartment complex."  He rambled when asked a question then reported it "was a dream".  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  He may clear after his fecal overload clears.  Past Psychiatric History: confusion  Risk to Self:  wandering Risk to Others:  none Prior Inpatient  Therapy:  not psychiatric Prior Outpatient Therapy:  none  Past Medical History:  Past Medical History:  Diagnosis Date  . Allergy   . Bladder cancer ([place])    a. followed by Dr. Jacqlyn Larsen  . Chronic combined systolic (congestive) and diastolic (congestive) heart failure ([place])    a. 07/2016 Echo: EF 30-35%, sev mid-apicalanteroseptal, ant, inf HK, Gr1 DD.  Marland Kitchen COPD (chronic obstructive pulmonary disease) ([place])   . Hyperlipidemia   . Hypertension   . NICM (nonischemic cardiomyopathy) ([place])    a. 07/2016 Echo: EF 30-35%, sev mid-apicalanteroseptal, ant, inf HK, Gr1 DD, mild MR, mildly dil LA, PASP 16mHg - ? stress induced CM.  .Marland KitchenNonobstructive Coronary atherosclerosis    a. 07/2016 Cath: LM nl, LAD nl, D1 40ost, LCX 252mRCA nl, EF 25-30%.  . Marland KitchenAF (paroxysmal atrial fibrillation) ([place]   a. Dx 08/2013. CHA2DS2VASc = 6-->no OAC 2/2 h/o falls and nocompliance.  . Tremor, essential    sees [facility] neurologist  . Type 2 diabetes mellitus with complication, without long-term current use of insulin ([place]7/30/2014  . Urinary incontinence     Past Surgical History:  Procedure Laterality Date  . bladder cancer    . CATARACT EXTRACTION    . HERNIA REPAIR    . LEFT HEART CATH AND CORONARY ANGIOGRAPHY N/A 07/11/2016   Procedure: Left Heart Cath and Coronary Angiography;  Surgeon: ArWellington HampshireMD;  [place]  [facility];  Service: Cardiovascular;  Laterality: N/A;   Family History:  Family History  Problem Relation Age of Onset  . Arthritis Mother   . Dementia Mother   . Cancer Maternal Uncle        prostate  . Cancer Maternal Aunt        breast cancer   Family Psychiatric  History: mother with dementia Social History:  Social History   Substance and Sexual Activity  Alcohol Use Yes  . Alcohol/week: 12.0 standard drinks  . Types: 12 Standard drinks or equivalent per week  . Frequency: Never   Comment: 1 drink with dinner     Social History   Substance and Sexual Activity   Drug Use No    Social History   Socioeconomic History  . Marital status: Legally Separated    Spouse name: Not on file  . Number of children: 3  . Years of education: 58  . Highest education level: Not on file  Occupational History  . Occupation: Retired Investment banker, operational  . Financial resource strain: Not hard at all  . Food insecurity:    Worry: Never true    Inability: Never true  . Transportation needs:    Medical: No    Non-medical: No  Tobacco Use  . Smoking status: Former Smoker    Packs/day: 1.50    Years: 40.00    Pack years: 60.00    Last attempt to quit: 02/03/2003    Years since quitting: 15.2  . Smokeless tobacco: Never Used  Substance and Sexual Activity  . Alcohol use: Yes    Alcohol/week: 12.0 standard drinks    Types: 12 Standard drinks or equivalent per week    Frequency: Never    Comment: 1 drink with dinner  . Drug use: No  . Sexual activity: Never  Lifestyle  . Physical activity:    Days per week: 0 days    Minutes per session: Not on file  . Stress: Not at all  Relationships  . Social connections:    Talks on phone: Not on file    Gets together: Not on file    Attends religious service: Not on file    Active member of club or organization: Not on file    Attends meetings of clubs or organizations: Not on file    Relationship status: Not on file  Other Topics Concern  . Not on file  Social History Narrative   Patient grew up in [place], [place]. He is separated from his wife since 76. They never divorced. Patient has 1 son and 2 daughters. 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   Additional Social History:    Allergies:   Allergies  Allergen Reactions  . Latex Itching    Labs:  Results for orders placed or performed during the hospital encounter of 04/20/18 (from the past 48 hour(s))  Comprehensive metabolic panel     Status: Abnormal   Collection Time: 04/20/18 10:54 AM  Result Value Ref Range   Sodium 138  135 - 145 mmol/L   Potassium 3.8 3.5 - 5.1 mmol/L   Chloride 105 98 - 111 mmol/L   CO2 28 22 - 32 mmol/L   Glucose, Bld 209 (H) 70 - 99 mg/dL   BUN 36 (H) 8 - 23 mg/dL   Creatinine, Ser 0.86 0.61 - 1.24 mg/dL   Calcium 9.1 8.9 - 10.3 mg/dL   Total Protein 6.5 6.5 - 8.1 g/dL  Albumin 3.5 3.5 - 5.0 g/dL   AST 16 15 - 41 U/L   ALT 22 0 - 44 U/L   Alkaline Phosphatase 68 38 - 126 U/L   Total Bilirubin 0.6 0.3 - 1.2 mg/dL   GFR calc non Af Amer >60 >60 mL/min   GFR calc Af Amer >60 >60 mL/min   Anion gap 5 5 - 15    Comment: Performed at [facility], [address] [place], [place] [address]  CBC with Differential     Status: Abnormal   Collection Time: 04/20/18 10:54 AM  Result Value Ref Range   WBC 7.0 4.0 - 10.5 K/uL   RBC 3.52 (L) 4.22 - 5.81 MIL/uL   Hemoglobin 12.3 (L) 13.0 - 17.0 g/dL   HCT 36.6 (L) 39.0 - 52.0 %   MCV 104.0 (H) 80.0 - 100.0 fL   MCH 34.9 (H) 26.0 - 34.0 pg   MCHC 33.6 30.0 - 36.0 g/dL   RDW 12.2 11.5 - 15.5 %   Platelets 178 150 - 400 K/uL   nRBC 0.0 0.0 - 0.2 %   Neutrophils Relative % 57 %   Neutro Abs 4.1 1.7 - 7.7 K/uL   Lymphocytes Relative 30 %   Lymphs Abs 2.1 0.7 - 4.0 K/uL   Monocytes Relative 10 %   Monocytes Absolute 0.7 0.1 - 1.0 K/uL   Eosinophils Relative 2 %   Eosinophils Absolute 0.1 0.0 - 0.5 K/uL   Basophils Relative 1 %   Basophils Absolute 0.1 0.0 - 0.1 K/uL   Immature Granulocytes 0 %   Abs Immature Granulocytes 0.01 0.00 - 0.07 K/uL    Comment: Performed at [facility], [address] [place], [place] [address]  TSH     Status: None   Collection Time: 04/20/18 10:54 AM  Result Value Ref Range   TSH 1.867 0.350 - 4.500 uIU/mL    Comment: Performed by a 3rd Generation assay with a functional sensitivity of <=0.01 uIU/mL. Performed at [facility], [address] [place], [place] [address]   Urinalysis, Complete w Microscopic     Status: Abnormal   Collection Time: 04/20/18 11:45 AM  Result  Value Ref Range   Color, Urine YELLOW (A) YELLOW   APPearance CLEAR (A) CLEAR   Specific Gravity, Urine 1.021 1.005 - 1.030   pH 5.0 5.0 - 8.0   Glucose, UA 50 (A) NEGATIVE mg/dL   Hgb urine dipstick NEGATIVE NEGATIVE   Bilirubin Urine NEGATIVE NEGATIVE   Ketones, ur NEGATIVE NEGATIVE mg/dL   Protein, ur NEGATIVE NEGATIVE mg/dL   Nitrite NEGATIVE NEGATIVE   Leukocytes,Ua NEGATIVE NEGATIVE   RBC / HPF 0-5 0 - 5 RBC/hpf   WBC, UA 0-5 0 - 5 WBC/hpf   Bacteria, UA NONE SEEN NONE SEEN   Squamous Epithelial / LPF 0-5 0 - 5   Mucus PRESENT     Comment: Performed at [facility], [address] [place], [place] [address]  Ammonia     Status: Abnormal   Collection Time: 04/20/18  4:13 PM  Result Value Ref Range   Ammonia <9 (L) 9 - 35 umol/L    Comment: Performed at [facility], [address] [place], [place] [address]    Current Facility-Administered Medications  Medication Dose Route Frequency Provider Last Rate Last Dose  . polyethylene glycol (MIRALAX / GLYCOLAX) packet 17 g  17 g Oral BID Nena Polio, MD   17 g at 04/20/18 1713  .  sorbitol, milk of mag, mineral oil, glycerin (SMOG) enema  960 mL Rectal Once Nena Polio, MD       Current Outpatient Medications  Medication Sig Dispense Refill  . acetaminophen (TYLENOL) 325 MG tablet Take 2 tablets (650 mg total) by mouth every 6 (six) hours as needed for mild pain (or Fever >/= 101).    Marland Kitchen ALPRAZolam (XANAX) 0.25 MG tablet TAKE 1 TABLET (0.25 MG TOTAL) BY MOUTH 2 (TWO) TIMES DAILY AS NEEDED FOR ANXIETY OR SLEEP 20 tablet 0  . atorvastatin (LIPITOR) 80 MG tablet TAKE 1 TABLET ONE TIME DAILY 90 tablet 3  . blood glucose meter kit and supplies KIT Dispense based on patient and insurance preference. Use up to four times daily as directed. 1 each 0  . fluticasone (FLONASE) 50 MCG/ACT nasal spray Place 2 sprays into both nostrils daily. 16 g 6  . glimepiride (AMARYL) 4 MG tablet take 1 tablet by mouth WITH  BREAKFAST. 90 tablet 1  . lisinopril (PRINIVIL,ZESTRIL) 20 MG tablet Take 1 tablet (20 mg total) by mouth daily. 90 tablet 1  . meclizine (ANTIVERT) 12.5 MG tablet Take 1 tablet (12.5 mg total) by mouth 3 (three) times daily as needed for dizziness. 30 tablet 2  . metFORMIN (GLUCOPHAGE) 500 MG tablet TAKE 1 TABLET BY MOUTH TWICE A DAY 180 tablet 2  . pantoprazole (PROTONIX) 40 MG tablet Take 1 tablet (40 mg total) by mouth daily. 90 tablet 1  . PARoxetine (PAXIL) 40 MG tablet Take 1 tablet (40 mg total) by mouth daily. 90 tablet 1  . primidone (MYSOLINE) 50 MG tablet Take 1.5 tablets (75 mg total) by mouth 2 (two) times daily. (Patient taking differently: Take 50 mg by mouth 2 (two) times daily. ) 270 tablet 1  . tiotropium (SPIRIVA) 18 MCG inhalation capsule Place 18 mcg as needed into inhaler and inhale.       Musculoskeletal: Strength & Muscle Tone: decreased Gait & Station: unsteady Patient leans: N/A  Psychiatric Specialty Exam: Physical Exam  Nursing note and vitals reviewed. Constitutional: He appears well-developed and well-nourished.  HENT:  Head: Normocephalic.  Neck: Normal range of motion.  Respiratory: Effort normal.  Musculoskeletal: Normal range of motion.  Neurological: He is alert.  Psychiatric: His speech is normal and behavior is normal. Thought content normal. His mood appears anxious. His affect is blunt. Cognition and memory are impaired. He expresses inappropriate judgment.    Review of Systems  Psychiatric/Behavioral: Positive for memory loss. The patient is nervous/anxious.   All other systems reviewed and are negative.   Blood pressure (!) 162/78, pulse (!) 55, temperature 98.1 F (36.7 C), temperature source Oral, resp. rate 16, SpO2 100 %.There is no height or weight on file to calculate BMI.  General Appearance: Disheveled  Eye Contact:  Fair  Speech:  Normal Rate  Volume:  Normal  Mood:  Anxious, mild  Affect:  Blunt  Thought Process:  Descriptions  of Associations: Circumstantial  Orientation:  Other:  person  Thought Content:  confusion  Suicidal Thoughts:  No  Homicidal Thoughts:  No  Memory:  Immediate;   Poor Recent;   Poor Remote;   Fair  Judgement:  Impaired  Insight:  Lacking  Psychomotor Activity:  Decreased  Concentration:  Concentration: Fair and Attention Span: Fair  Recall:  Poor  Fund of Knowledge:  Fair  Language:  Good  Akathisia:  No  Handed:  Right  AIMS (if indicated):     Assets:  Housing  Leisure Time Resilience Social Support  ADL's:  Impaired  Cognition:  Impaired,  Moderate  Sleep:       Treatment Plan Summary: Altered mental status: -Close monitoring  Depression: -Continued Paxil 40 mg daily  Anxiety: -Discontinued Xanax 0.25 mg BID PRN  -Started Ativan 0.25 mg BID PRN, better tolerated in the elderly  Disposition: SNF placement   Waylan Boga, NP 04/20/2018 5:21 PM

## 2018-04-20 NOTE — TOC Initial Note (Signed)
Transition of Care [facility]) - Initial/Assessment Note    Patient Details  Name: Chase Simmons MRN: [id number] Date of Birth: 1933-12-29  [facility] CM/SW Contact:    Latanya Maudlin, RN Phone Number: 04/20/2018, 3:08 PM  Clinical Narrative:  TOC was consulted to assist with possible placement. Patient has altered mental status and is unable to provide full history. Spoke with daughter, Chase Simmons [phone] to complete assessment. Per Chase Simmons patient is at [facility]. Chase Simmons and her sister both stay with the patient most of the day since his confusion has worsened. Per Chase Simmons the patient had entered the wrong room this morning and within the last two weeks his mental status has been much worse. Also, the patients physical abilities have declined. Chase Simmons tells me the patient was walking long distances with his rolling walker but is now pretty much limited to "4 or 5 steps" at a time. Patient using a wheelchair mostly. Has chronic knee issues which per Chase Simmons need surgery but cannot be operated on at this time. Bed search initiated for SNF. PT pending., Chase Simmons prefers discharge to a facility and has repeated several times that she does not feel her father can be safely discharged home even with her and her sister pretty much being there all day. TOC team will continue to follow for PT rec                   Expected Discharge Plan: [place] Barriers to Discharge: No Barriers Identified   Patient Goals and CMS Choice   CMS Medicare.gov Compare Post Acute Care list provided to:: Patient Represenative (must comment)(patients daughter)    Expected Discharge Plan and Services Expected Discharge Plan: [place] In-house Referral: Clinical Social Work Discharge Planning Services: CM Consult   Living arrangements for the past 2 months: [place]                          Prior Living Arrangements/Services Living  arrangements for the past 2 months: [place] Lives with:: Self Patient language and need for interpreter reviewed:: No Do you feel safe going back to the place where you live?: No(safety concerns due to wandering)      Need for Family Participation in Patient Care: Yes (Comment)(pt with AMS) Care giver support system in place?: Yes (comment)(both daughters) Current home services: DME Criminal Activity/Legal Involvement Pertinent to Current Situation/Hospitalization: No - Comment as needed  Activities of Daily Living      Permission Sought/Granted Permission sought to share information with : Case Manager, Family Supports                Emotional Assessment Appearance:: Disheveled, Appears older than stated age Attitude/Demeanor/Rapport: Unable to Assess Affect (typically observed): Unable to Assess Orientation: : Oriented to Self      Admission diagnosis:  ams Patient Active Problem List   Diagnosis Date Noted  . Frequency of urination 03/01/2018  . Confusion 03/01/2018  . Bilateral leg edema 11/08/2016  . Vertigo 08/25/2016  . Nausea 08/01/2016  . CHF (congestive heart failure) ([place]) 08/01/2016  . Atypical chest pain 07/20/2016  . COPD (chronic obstructive pulmonary disease) ([place]) 03/15/2016  . Coronary atherosclerosis of native coronary artery 01/05/2016  . Aortic atherosclerosis ([place]) 01/05/2016  . Bradycardia 01/05/2016  . Anxiety 06/17/2015  . Malignant neoplasm of urinary bladder ([place]) 06/17/2015  . Allergic rhinitis 05/14/2015  . Knee pain, bilateral 03/16/2015  .  PAF (paroxysmal atrial fibrillation) ([place]) 08/30/2013  . Acid reflux 08/30/2013  . Tremor, essential 10/22/2012  . Hyperlipidemia 10/22/2012  . HTN (hypertension) 08/01/2012  . Type 2 diabetes mellitus with complication, without long-term current use of insulin ([place]) 08/01/2012  . Benign prostatic hyperplasia with urinary obstruction 09/02/2011   PCP:  Leone Haven,  MD Pharmacy:   [id number] - , [place] [address] [place] [address] Phone: [phone] Fax: [phone]     Social Determinants of Health (SDOH) Interventions    Readmission Risk Interventions No flowsheet data found.

## 2018-04-21 DIAGNOSIS — Z87891 Personal history of nicotine dependence: Secondary | ICD-10-CM | POA: Diagnosis not present

## 2018-04-21 DIAGNOSIS — R69 Illness, unspecified: Secondary | ICD-10-CM | POA: Diagnosis not present

## 2018-04-21 DIAGNOSIS — R4182 Altered mental status, unspecified: Secondary | ICD-10-CM | POA: Diagnosis not present

## 2018-04-21 DIAGNOSIS — Z8551 Personal history of malignant neoplasm of bladder: Secondary | ICD-10-CM | POA: Diagnosis not present

## 2018-04-21 DIAGNOSIS — E119 Type 2 diabetes mellitus without complications: Secondary | ICD-10-CM | POA: Diagnosis not present

## 2018-04-21 DIAGNOSIS — I5042 Chronic combined systolic (congestive) and diastolic (congestive) heart failure: Secondary | ICD-10-CM | POA: Diagnosis not present

## 2018-04-21 DIAGNOSIS — Z79899 Other long term (current) drug therapy: Secondary | ICD-10-CM | POA: Diagnosis not present

## 2018-04-21 DIAGNOSIS — Z794 Long term (current) use of insulin: Secondary | ICD-10-CM | POA: Diagnosis not present

## 2018-04-21 DIAGNOSIS — R410 Disorientation, unspecified: Secondary | ICD-10-CM | POA: Diagnosis not present

## 2018-04-21 DIAGNOSIS — J449 Chronic obstructive pulmonary disease, unspecified: Secondary | ICD-10-CM | POA: Diagnosis not present

## 2018-04-21 DIAGNOSIS — I110 Hypertensive heart disease with heart failure: Secondary | ICD-10-CM | POA: Diagnosis not present

## 2018-04-21 LAB — GLUCOSE, CAPILLARY
Glucose-Capillary: 181 mg/dL — ABNORMAL HIGH (ref 70–99)
Glucose-Capillary: 261 mg/dL — ABNORMAL HIGH (ref 70–99)

## 2018-04-21 MED ORDER — OLANZAPINE 5 MG PO TBDP
2.5000 mg | ORAL_TABLET | Freq: Three times a day (TID) | ORAL | Status: DC | PRN
  Administered 2018-04-22 – 2018-04-24 (×2): 2.5 mg via ORAL
  Filled 2018-04-21 (×2): qty 1

## 2018-04-21 NOTE — ED Notes (Signed)
Pt daughter called for update on pt. Asked about BM and getting blood clots.

## 2018-04-21 NOTE — ED Notes (Signed)
PAtient eating breakfast tray at this time

## 2018-04-21 NOTE — ED Notes (Signed)
Pt attempting to get out of bed; thinks his daughter is through the door in his room; opened door to show pt that is just a closet for equipment; verbalized understanding; assisted back up into bed, extra pillow for comfort; no complaints

## 2018-04-21 NOTE — Consult Note (Signed)
[facility] Consult   Reason for Consult:  "competency" Referring Physician:  EDP Patient Identification: Chase Simmons MRN:  [id number] Principal Diagnosis:Confusion, altered mental status Diagnosis:  Altered mental status  Total Time spent with patient: 20 minutes  Subjective:   Chase Simmons is a 85 y.o. male patient admitted with confusion and wandering. 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 Assessed for capacity, Chase Simmons does not have the mental capacity at this time to make safe medical decisions based on his state of confusion, he is unable to make consistent choices, rationalize information, appreciate his condition and appears impaired by his underlying neurocognitive disorder.  HPI: per chart reviwe  85 yo IVC'd by his daughter for increase in confusion needing a higher level of care than his current independent living facility.  His increase in confusion started two weeks ago until the point of wandering.  His admission his medications have been adjusted and PRN Xanax to as needed Ativan, received 1.5 mg total daily dosing yesterday.  To that he received 2 doses of Paxil 9 hours apart.   This afternoon on interview the patient was found sitting in his room, attempting to eat lunch.  He was notably confused, and continued to perseverate throughout the interview on his "big mouth daughter".  He was awake alert and oriented only to himself, he denied any current pain, anxiety or distress.  Her to interview he had called out to the nurse to help him to the bathroom, but is unable to clarify if he needs to void or defecate.  Noted that he is currently on a bowel regimen, concerned that his constipation is his mental status.     Past Psychiatric History: Major neurocognitive disorder  Risk to Self:  wandering Risk to Others:  none Prior Inpatient Therapy:  not psychiatric Prior Outpatient Therapy:   none  Past Medical History:  Past Medical History:  Diagnosis Date  . Allergy   . Bladder cancer ([place])    a. followed by Dr. Jacqlyn Larsen  . Chronic combined systolic (congestive) and diastolic (congestive) heart failure ([place])    a. 07/2016 Echo: EF 30-35%, sev mid-apicalanteroseptal, ant, inf HK, Gr1 DD.  Marland Kitchen COPD (chronic obstructive pulmonary disease) ([place])   . Hyperlipidemia   . Hypertension   . NICM (nonischemic cardiomyopathy) ([place])    a. 07/2016 Echo: EF 30-35%, sev mid-apicalanteroseptal, ant, inf HK, Gr1 DD, mild Chase, mildly dil LA, PASP 57mHg - ? stress induced CM.  .Marland KitchenNonobstructive Coronary atherosclerosis    a. 07/2016 Cath: LM nl, LAD nl, D1 40ost, LCX 246mRCA nl, EF 25-30%.  . Marland KitchenAF (paroxysmal atrial fibrillation) ([facility]   a. Dx 08/2013. CHA2DS2VASc = 6-->no OAC 2/2 h/o falls and nocompliance.  . Tremor, essential    sees [facility] neurologist  . Type 2 diabetes mellitus with complication, without long-term current use of insulin ([facility]/30/2014  . Urinary incontinence     Past Surgical History:  Procedure Laterality Date  . bladder cancer    . CATARACT EXTRACTION    . HERNIA REPAIR    . LEFT HEART CATH AND CORONARY ANGIOGRAPHY N/A 07/11/2016   Procedure: Left Heart Cath and Coronary Angiography;  Surgeon: ArWellington HampshireMD;  [facility];  Service: Cardiovascular;  Laterality: N/A;   Family History:  Family History  Problem Relation Age of Onset  . Arthritis Mother   . Dementia Mother   .  Cancer Maternal Uncle        prostate  . Cancer Maternal Aunt        breast cancer   Family Psychiatric  History: mother with dementia Social History:  Social History   Substance and Sexual Activity  Alcohol Use Yes  . Alcohol/week: 12.0 standard drinks  . Types: 12 Standard drinks or equivalent per week  . Frequency: Never   Comment: 1 drink with dinner     Social History   Substance and Sexual Activity  Drug Use No    Social History   Socioeconomic  History  . Marital status: Legally Separated    Spouse name: Not on file  . Number of children: 3  . Years of education: 81  . Highest education level: Not on file  Occupational History  . Occupation: Retired Investment banker, operational  . Financial resource strain: Not hard at all  . Food insecurity:    Worry: Never true    Inability: Never true  . Transportation needs:    Medical: No    Non-medical: No  Tobacco Use  . Smoking status: Former Smoker    Packs/day: 1.50    Years: 40.00    Pack years: 60.00    Last attempt to quit: 02/03/2003    Years since quitting: 15.2  . Smokeless tobacco: Never Used  Substance and Sexual Activity  . Alcohol use: Yes    Alcohol/week: 12.0 standard drinks    Types: 12 Standard drinks or equivalent per week    Frequency: Never    Comment: 1 drink with dinner  . Drug use: No  . Sexual activity: Never  Lifestyle  . Physical activity:    Days per week: 0 days    Minutes per session: Not on file  . Stress: Not at all  Relationships  . Social connections:    Talks on phone: Not on file    Gets together: Not on file    Attends religious service: Not on file    Active member of club or organization: Not on file    Attends meetings of clubs or organizations: Not on file    Relationship status: Not on file  Other Topics Concern  . Not on file  Social History Narrative   Patient grew up in [place], [place]. He is separated from his wife since 8. They never divorced. Patient has 1 son and 2 daughters. 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   Additional Social History:    Allergies:   Allergies  Allergen Reactions  . Latex Itching    Labs:  Results for orders placed or performed during the hospital encounter of 04/20/18 (from the past 48 hour(s))  Comprehensive metabolic panel     Status: Abnormal   Collection Time: 04/20/18 10:54 AM  Result Value Ref Range   Sodium 138 135 - 145 mmol/L   Potassium 3.8 3.5 - 5.1 mmol/L    Chloride 105 98 - 111 mmol/L   CO2 28 22 - 32 mmol/L   Glucose, Bld 209 (H) 70 - 99 mg/dL   BUN 36 (H) 8 - 23 mg/dL   Creatinine, Ser 0.86 0.61 - 1.24 mg/dL   Calcium 9.1 8.9 - 10.3 mg/dL   Total Protein 6.5 6.5 - 8.1 g/dL   Albumin 3.5 3.5 - 5.0 g/dL   AST 16 15 - 41 U/L   ALT 22 0 - 44 U/L   Alkaline Phosphatase 68 38 - 126 U/L  Total Bilirubin 0.6 0.3 - 1.2 mg/dL   GFR calc non Af Amer >60 >60 mL/min   GFR calc Af Amer >60 >60 mL/min   Anion gap 5 5 - 15    Comment: Performed at [facility], [address] [place], [place] [address]  CBC with Differential     Status: Abnormal   Collection Time: 04/20/18 10:54 AM  Result Value Ref Range   WBC 7.0 4.0 - 10.5 K/uL   RBC 3.52 (L) 4.22 - 5.81 MIL/uL   Hemoglobin 12.3 (L) 13.0 - 17.0 g/dL   HCT 36.6 (L) 39.0 - 52.0 %   MCV 104.0 (H) 80.0 - 100.0 fL   MCH 34.9 (H) 26.0 - 34.0 pg   MCHC 33.6 30.0 - 36.0 g/dL   RDW 12.2 11.5 - 15.5 %   Platelets 178 150 - 400 K/uL   nRBC 0.0 0.0 - 0.2 %   Neutrophils Relative % 57 %   Neutro Abs 4.1 1.7 - 7.7 K/uL   Lymphocytes Relative 30 %   Lymphs Abs 2.1 0.7 - 4.0 K/uL   Monocytes Relative 10 %   Monocytes Absolute 0.7 0.1 - 1.0 K/uL   Eosinophils Relative 2 %   Eosinophils Absolute 0.1 0.0 - 0.5 K/uL   Basophils Relative 1 %   Basophils Absolute 0.1 0.0 - 0.1 K/uL   Immature Granulocytes 0 %   Abs Immature Granulocytes 0.01 0.00 - 0.07 K/uL    Comment: Performed at [facility], [address] [place], [place] [address]  TSH     Status: None   Collection Time: 04/20/18 10:54 AM  Result Value Ref Range   TSH 1.867 0.350 - 4.500 uIU/mL    Comment: Performed by a 3rd Generation assay with a functional sensitivity of <=0.01 uIU/mL. Performed at [facility], [address] [place], [place] [address]   Urinalysis, Complete w Microscopic     Status: Abnormal   Collection Time: 04/20/18 11:45 AM  Result Value Ref Range   Color, Urine YELLOW (A) YELLOW    APPearance CLEAR (A) CLEAR   Specific Gravity, Urine 1.021 1.005 - 1.030   pH 5.0 5.0 - 8.0   Glucose, UA 50 (A) NEGATIVE mg/dL   Hgb urine dipstick NEGATIVE NEGATIVE   Bilirubin Urine NEGATIVE NEGATIVE   Ketones, ur NEGATIVE NEGATIVE mg/dL   Protein, ur NEGATIVE NEGATIVE mg/dL   Nitrite NEGATIVE NEGATIVE   Leukocytes,Ua NEGATIVE NEGATIVE   RBC / HPF 0-5 0 - 5 RBC/hpf   WBC, UA 0-5 0 - 5 WBC/hpf   Bacteria, UA NONE SEEN NONE SEEN   Squamous Epithelial / LPF 0-5 0 - 5   Mucus PRESENT     Comment: Performed at [facility], [address] [place], [place] [address]  Ammonia     Status: Abnormal   Collection Time: 04/20/18  4:13 PM  Result Value Ref Range   Ammonia <9 (L) 9 - 35 umol/L    Comment: Performed at [facility], [address] [place], [place] [address]  Glucose, capillary     Status: Abnormal   Collection Time: 04/20/18  8:07 PM  Result Value Ref Range   Glucose-Capillary 194 (H) 70 - 99 mg/dL   Comment 1 Document in Chart     Current Facility-Administered Medications  Medication Dose Route Frequency Provider Last Rate Last Dose  . acetaminophen (TYLENOL) tablet 650 mg  650 mg Oral Q6H PRN Patrecia Pour, NP      .  atorvastatin (LIPITOR) tablet 80 mg  80 mg Oral q1800 Patrecia Pour, NP   80 mg at 04/20/18 1922  . fluticasone (FLONASE) 50 MCG/ACT nasal spray 2 spray  2 spray Each Nare Daily Patrecia Pour, NP   2 spray at 04/21/18 0959  . glimepiride (AMARYL) tablet 4 mg  4 mg Oral Q breakfast Patrecia Pour, NP   4 mg at 04/21/18 0856  . lisinopril (ZESTRIL) tablet 20 mg  20 mg Oral Daily Patrecia Pour, NP   20 mg at 04/21/18 0958  . meclizine (ANTIVERT) tablet 12.5 mg  12.5 mg Oral TID PRN Patrecia Pour, NP      . metFORMIN (GLUCOPHAGE) tablet 500 mg  500 mg Oral BID Patrecia Pour, NP   500 mg at 04/21/18 0855  . pantoprazole (PROTONIX) EC tablet 40 mg  40 mg Oral Daily Patrecia Pour, NP   40 mg at 04/21/18 0856  . PARoxetine  (PAXIL) tablet 40 mg  40 mg Oral Daily Patrecia Pour, NP   40 mg at 04/21/18 5072  . polyethylene glycol (MIRALAX / GLYCOLAX) packet 17 g  17 g Oral BID Nena Polio, MD   17 g at 04/21/18 0958  . primidone (MYSOLINE) tablet 50 mg  50 mg Oral BID Patrecia Pour, NP   50 mg at 04/21/18 2575  . sorbitol, milk of mag, mineral oil, glycerin (SMOG) enema  960 mL Rectal Once Nena Polio, MD      . tiotropium [facility]) inhalation capsule [facility] use ONLY) 18 mcg  18 mcg Inhalation Daily Patrecia Pour, NP   18 mcg at 04/21/18 0011   Current Outpatient Medications  Medication Sig Dispense Refill  . traZODone (DESYREL) 50 MG tablet Take 50-100 mg by mouth Nightly.    Marland Kitchen acetaminophen (TYLENOL) 325 MG tablet Take 2 tablets (650 mg total) by mouth every 6 (six) hours as needed for mild pain (or Fever >/= 101).    Marland Kitchen atorvastatin (LIPITOR) 80 MG tablet TAKE 1 TABLET ONE TIME DAILY 90 tablet 3  . blood glucose meter kit and supplies KIT Dispense based on patient and insurance preference. Use up to four times daily as directed. 1 each 0  . fluticasone (FLONASE) 50 MCG/ACT nasal spray Place 2 sprays into both nostrils daily. 16 g 6  . glimepiride (AMARYL) 4 MG tablet take 1 tablet by mouth WITH BREAKFAST. 90 tablet 1  . lisinopril (PRINIVIL,ZESTRIL) 20 MG tablet Take 1 tablet (20 mg total) by mouth daily. 90 tablet 1  . meclizine (ANTIVERT) 12.5 MG tablet Take 1 tablet (12.5 mg total) by mouth 3 (three) times daily as needed for dizziness. 30 tablet 2  . metFORMIN (GLUCOPHAGE) 500 MG tablet TAKE 1 TABLET BY MOUTH TWICE A DAY 180 tablet 2  . pantoprazole (PROTONIX) 40 MG tablet Take 1 tablet (40 mg total) by mouth daily. 90 tablet 1  . PARoxetine (PAXIL) 40 MG tablet Take 1 tablet (40 mg total) by mouth daily. 90 tablet 1  . primidone (MYSOLINE) 50 MG tablet Take 1.5 tablets (75 mg total) by mouth 2 (two) times daily. (Patient taking differently: Take 50 mg by mouth 2 (two) times daily. ) 270 tablet 1  .  tiotropium (SPIRIVA) 18 MCG inhalation capsule Place 18 mcg as needed into inhaler and inhale.       Musculoskeletal: Strength & Muscle Tone: decreased Gait & Station: unsteady Patient leans: N/A  Psychiatric Specialty Exam: Physical Exam  Nursing note and vitals reviewed. Constitutional: He appears well-developed and well-nourished.  HENT:  Head: Normocephalic.  Neck: Normal range of motion.  Respiratory: Effort normal.  Musculoskeletal: Normal range of motion.  Neurological: He is alert.  Psychiatric: His speech is normal and behavior is normal. Thought content normal. His mood appears anxious. His affect is blunt. Cognition and memory are impaired. He expresses inappropriate judgment.    Review of Systems  Neurological: Positive for tremors.  Psychiatric/Behavioral: Positive for memory loss.  All other systems reviewed and are negative.   Blood pressure (!) 184/102, pulse 60, temperature 98.1 F (36.7 C), temperature source Oral, resp. rate 18, SpO2 96 %.There is no height or weight on file to calculate BMI.  General Appearance: Disheveled and Covered in food  Eye Contact:  Poor  Speech:  Clear, rambling  Volume:  Normal  Mood:  "Very good"  Affect:  Pleasantly confused  Thought Process: Perseveration on his daughter, also tangential  Orientation: Self only  Thought Content: Denies AVH, some paranoid ideation  Suicidal Thoughts:  No  Homicidal Thoughts:  No  Memory:  Immediate;   Poor Recent;   Poor Remote;   Fair  Judgement:  Impaired  Insight:  Lacking  Psychomotor Activity:  Tremor  Concentration:  Concentration: Poor and Attention Span: Poor  Recall:  Poor  Fund of Knowledge:  Fair  Language:  Good  Akathisia:  No  Handed:  Right  AIMS (if indicated):     Assets:  Housing Leisure Time Resilience Social Support  ADL's:  Impaired  Cognition:  Impaired,  Moderate  Sleep:   Midwife Summary:  After reviewing his notes, patient appears  to have an acute decline which would provide more of an argument for a delirium picture rather than a worsening dementia.  Attempted to contact his family for further collateral and from but they were unable to be reached by telephone at this time.  Mentions of constipation and urinary difficulties/retention?  May be secondary to anticholinergic side effects related to his medications.  Will alter his PRN medications to assist more with his confusion/delirium.    Altered mental status: -Close monitoring -Agree with bowel regimen -will discontinue his as needed Lorazepam, place we will add low dose PRN Zyprexa 0.5 mg p.o. 3 times daily for agitation -Would recommend limiting use of benzodiazepines, or anticholinergic medications (meclizine) - If the patient is reporting pruritis would recommend topical ointments  Depression/anxiety - Attempted to contact family regarding his paxil, would recommend changing to a less anticholinergic SSRI such as zoloft 28m po q daily vs prozac 285mpo q daily and titrating from there.   Disposition: SNF placement   JuChong SicilianDO 04/21/2018 1:41 PM

## 2018-04-21 NOTE — ED Notes (Signed)
Pt given meal tray.

## 2018-04-21 NOTE — ED Notes (Signed)
Pt requested to speak with daughter. [name] called and phone given to pt.

## 2018-04-21 NOTE — ED Notes (Signed)
Pt given sips of water; resting quietly at this time

## 2018-04-21 NOTE — ED Notes (Signed)
At bedside; pt wanting to get up and scooting towards the bottom of the bed; repositioned and reoriented to place; pt given supper tray and is now eating and calm;

## 2018-04-21 NOTE — ED Notes (Signed)
Daughters updated at this time about waiting for SNF placement.

## 2018-04-21 NOTE — ED Notes (Signed)
Pt sleeping comfortably. Bed in lowest position. Rails up. Door open.

## 2018-04-21 NOTE — ED Notes (Signed)
Pt asleep. Rails up. Bed in lowest position. Lights dim. Door open.

## 2018-04-22 DIAGNOSIS — R4182 Altered mental status, unspecified: Secondary | ICD-10-CM | POA: Diagnosis not present

## 2018-04-22 DIAGNOSIS — R410 Disorientation, unspecified: Secondary | ICD-10-CM | POA: Diagnosis not present

## 2018-04-22 DIAGNOSIS — E119 Type 2 diabetes mellitus without complications: Secondary | ICD-10-CM | POA: Diagnosis not present

## 2018-04-22 DIAGNOSIS — R69 Illness, unspecified: Secondary | ICD-10-CM | POA: Diagnosis not present

## 2018-04-22 DIAGNOSIS — Z794 Long term (current) use of insulin: Secondary | ICD-10-CM | POA: Diagnosis not present

## 2018-04-22 DIAGNOSIS — J449 Chronic obstructive pulmonary disease, unspecified: Secondary | ICD-10-CM | POA: Diagnosis not present

## 2018-04-22 DIAGNOSIS — Z87891 Personal history of nicotine dependence: Secondary | ICD-10-CM | POA: Diagnosis not present

## 2018-04-22 DIAGNOSIS — Z8551 Personal history of malignant neoplasm of bladder: Secondary | ICD-10-CM | POA: Diagnosis not present

## 2018-04-22 DIAGNOSIS — I5042 Chronic combined systolic (congestive) and diastolic (congestive) heart failure: Secondary | ICD-10-CM | POA: Diagnosis not present

## 2018-04-22 DIAGNOSIS — Z79899 Other long term (current) drug therapy: Secondary | ICD-10-CM | POA: Diagnosis not present

## 2018-04-22 DIAGNOSIS — I110 Hypertensive heart disease with heart failure: Secondary | ICD-10-CM | POA: Diagnosis not present

## 2018-04-22 LAB — VITAMIN B12: Vitamin B-12: 548 pg/mL (ref 180–914)

## 2018-04-22 MED ORDER — OLANZAPINE 5 MG PO TBDP
2.5000 mg | ORAL_TABLET | Freq: Two times a day (BID) | ORAL | Status: DC
  Administered 2018-04-22 (×2): 2.5 mg via ORAL
  Filled 2018-04-22 (×2): qty 1

## 2018-04-22 NOTE — ED Notes (Signed)
Pt found with condom cath pulled off and wet. Pt was cleaned changed and another condom cath placed on. Process run by charge nurse who agreed it should be replaced.

## 2018-04-22 NOTE — ED Notes (Signed)
Eyes closed, resp even and unlabored 

## 2018-04-22 NOTE — ED Notes (Signed)
Pt trying to get up out of bed; reoriented to time and place; pt wanting to get up to "the couch" against the wall; discussed with pt having a recliner brought into the room during the day so he can sit up and watch some tv; pt says "that would be good"

## 2018-04-22 NOTE — TOC Progression Note (Signed)
Transition of Care [facility]) - Progression Note    Patient Details  Name: BURYL BAMBER MRN: [id number] Date of Birth: 24-Jun-1933  [facility]) CM/SW [place], LCSW Phone Number: 04/22/2018, 9:18 AM  Clinical Narrative:   TOC CM/SW is following.  Waiting for SNF available beds.  Current living status: [facility]  The patint is an 85 year old Caucasian male who presented to the ED with altered mental status/increase confusion on 04/20/2018. Onset of symptoms 2 weeks.  Social work assessment completed, Psych assessment completed, physical therapy consults completed, FL2 completed, and family updated. (see chart)  TOC CM/SW contacted Mrs. Sherry [phone].  Informed Mrs. Sherry that [facility] team is waiting for SNF bed offers.    TOC CM/SW updated attending.    Expected Discharge Plan: [place] Barriers to Discharge: No Barriers Identified  Expected Discharge Plan and Services Expected Discharge Plan: [place] In-house Referral: Clinical Social Work Discharge Planning Services: CM Consult   Living arrangements for the past 2 months: [place]         Social Determinants of Health (SDOH) Interventions    Readmission Risk Interventions No flowsheet data found.

## 2018-04-22 NOTE — ED Notes (Signed)
Pt using call bell; says now he's cold; heat adjusted up just a little; HOB moved lower for pt comfort;

## 2018-04-22 NOTE — ED Notes (Signed)
Psychiatrist at bedside

## 2018-04-22 NOTE — ED Notes (Signed)
Pt awake, blood pressure cuff off and pt trying to figure out what it is; explained to pt what he was holding on to; allowed this RN to put the cuff back on as well as the oxygen sensor; pt given warm blanket for comfort and heat turned up; pt's arms cool to touch;

## 2018-04-22 NOTE — ED Notes (Signed)
Pt used call bell again; says he's hungry; assisted pt with eating a cup of applesauce and pt given graham crackers and water; appreciative;

## 2018-04-22 NOTE — ED Provider Notes (Signed)
-----------------------------------------   6:15 AM on 04/22/2018 -----------------------------------------   Blood pressure 95/71, pulse (!) 51, temperature 98.1 F (36.7 C), temperature source Oral, resp. rate 16, SpO2 98 %.  The patient is sleeping at this time.  There have been no acute events since the last update.  Awaiting disposition plan from social work team.   Paulette Blanch, MD 04/22/18 [phone]

## 2018-04-22 NOTE — ED Notes (Signed)
Daughter called and wanted update; also wanted to know if enema had been given. She then asked "how long will you let him go without a bowel movement before you give him the enema?" I explained that pt had been restless last night and the enema was not appropriate, and informed her that we would be giving her the enema this morning.

## 2018-04-22 NOTE — ED Notes (Signed)
Pt at this time able to tell this RN the time, date, person, and place. Pt is still unsure exactly why he is here.

## 2018-04-22 NOTE — ED Notes (Signed)
Pt awakened, provided with breakfast, repositioned, offered blanket, which he refused. Pt catheter drainage bag at approximately 50cc. NAD noted.

## 2018-04-22 NOTE — ED Notes (Signed)
Pt had another BM on a bedpan at this time. Pt cleaned with wipes and new brief applied by this RN. Pt turned to his side independently and is resting comfortable there.

## 2018-04-22 NOTE — ED Notes (Signed)
Pt voided moderate amount of stool after enema administered.

## 2018-04-22 NOTE — ED Notes (Signed)
Bed alarm going off and this RN to bedside. Pt reoriented to place and that he needs to remain in bed. Pt situated back in bed and told that he is able to urinate due to condom catheter being on. Pt given some water.

## 2018-04-22 NOTE — ED Notes (Signed)
Pt assisted to brush their teeth. Pt now thinks he is in prison and is asking for a lawyer so he can go home. This RN reinforces that he is in the hospital waiting for nursing home placement in [place], [place]. He says he isn't sure if he's in [place] or not. He wants to know how long he will be here.

## 2018-04-22 NOTE — Consult Note (Signed)
[place] Consult   Reason for Consult:  "competency" Referring Physician:  EDP Patient Identification: Chase Simmons MRN:  [id number] Principal Diagnosis:Confusion, altered mental status Diagnosis:  Altered mental status  Total Time spent with patient: 20 minutes  Subjective:   Chase Simmons is a 85 y.o. male patient admitted with confusion and wandering. Assessed for capacity, Chase Simmons does not have the mental capacity at this time to make safe medical decisions based on his state of confusion, he is unable to make consistent choices, rationalize information, appreciate his condition and appears impaired by his altered mental status.  HPI:   85 yo M brought in his daughter for increase in confusion needing a higher level of care than his current independent living facility.  His increase in confusion started two weeks ago resulting in confusion, paranoia and wandering.  He received PRN olanzapine this morning at 3:00, with fair effect. He was able to defecate this morning following an enema.  Sleep was not recorded but per the notes it appears that he slept about 3 to 4 hours, following his olanzapine administration.  Otherwise no acute events  This morning on interview the patient was found laying in his bed, watching television. He continues to be notably confused, and appeared more anxious and paranoid compared to yesterday. He was awake alert and oriented only to himself, he perseverated that he is currently in a jail.  When he was educated about his current surroundings being a hospital, he started demanding that the hospital pay him for his time.  He denied feeling pain, constipation, dysuria.  When he was gently confronted that he might be mildly confused he responded "that is horse shit" and went on to defend himself stating that he is just been "trucking for a while".   His daughter Chase Simmons was contacted at [phone], she was able to confirm that the patient has  had worsening mental status and confusion over the past 3 weeks.  She reviewed his past pertinent medication changes, noted that they have been giving him meclizine daily, and that his Paxil has been effective for several years for his history of severe anxiety, OCD and history of panic attacks.  She was provided informed consent to administering olanzapine q. 1600 and QHS, assist in managing his confusion and behaviors while we work on his delirium.  She was in agreement with the above plan.  Past Psychiatric History: Major neurocognitive disorder  Risk to Self:  wandering Risk to Others:  none Prior Inpatient Therapy:  not psychiatric Prior Outpatient Therapy:  none  Past Medical History:  Past Medical History:  Diagnosis Date  . Allergy   . Bladder cancer ([place])    a. followed by Dr. Jacqlyn Larsen  . Chronic combined systolic (congestive) and diastolic (congestive) heart failure ([place])    a. 07/2016 Echo: EF 30-35%, sev mid-apicalanteroseptal, ant, inf HK, Gr1 DD.  Marland Kitchen COPD (chronic obstructive pulmonary disease) ([place])   . Hyperlipidemia   . Hypertension   . NICM (nonischemic cardiomyopathy) ([place])    a. 07/2016 Echo: EF 30-35%, sev mid-apicalanteroseptal, ant, inf HK, Gr1 DD, mild Chase, mildly dil LA, PASP 102mHg - ? stress induced CM.  .Marland KitchenNonobstructive Coronary atherosclerosis    a. 07/2016 Cath: LM nl, LAD nl, D1 40ost, LCX 287mRCA nl, EF 25-30%.  . Marland KitchenAF (paroxysmal atrial fibrillation) ([facility] [place]   a. Dx 08/2013. CHA2DS2VASc = 6-->no OAC 2/2 h/o falls and nocompliance.  . Tremor, essential    sees [facility]  neurologist  . Type 2 diabetes mellitus with complication, without long-term current use of insulin ([place]) 08/01/2012  . Urinary incontinence     Past Surgical History:  Procedure Laterality Date  . bladder cancer    . CATARACT EXTRACTION    . HERNIA REPAIR    . LEFT HEART CATH AND CORONARY ANGIOGRAPHY N/A 07/11/2016   Procedure: Left Heart Cath and Coronary Angiography;  Surgeon: Wellington Hampshire,  MD;  Location: [facility];  Service: Cardiovascular;  Laterality: N/A;   Family History:  Family History  Problem Relation Age of Onset  . Arthritis Mother   . Dementia Mother   . Cancer Maternal Uncle        prostate  . Cancer Maternal Aunt        breast cancer   Family Psychiatric  History: mother with dementia Social History:  Social History   Substance and Sexual Activity  Alcohol Use Yes  . Alcohol/week: 12.0 standard drinks  . Types: 12 Standard drinks or equivalent per week  . Frequency: Never   Comment: 1 drink with dinner     Social History   Substance and Sexual Activity  Drug Use No    Social History   Socioeconomic History  . Marital status: Legally Separated    Spouse name: Not on file  . Number of children: 3  . Years of education: 22  . Highest education level: Not on file  Occupational History  . Occupation: Retired Investment banker, operational  . Financial resource strain: Not hard at all  . Food insecurity:    Worry: Never true    Inability: Never true  . Transportation needs:    Medical: No    Non-medical: No  Tobacco Use  . Smoking status: Former Smoker    Packs/day: 1.50    Years: 40.00    Pack years: 60.00    Last attempt to quit: 02/03/2003    Years since quitting: 15.2  . Smokeless tobacco: Never Used  Substance and Sexual Activity  . Alcohol use: Yes    Alcohol/week: 12.0 standard drinks    Types: 12 Standard drinks or equivalent per week    Frequency: Never    Comment: 1 drink with dinner  . Drug use: No  . Sexual activity: Never  Lifestyle  . Physical activity:    Days per week: 0 days    Minutes per session: Not on file  . Stress: Not at all  Relationships  . Social connections:    Talks on phone: Not on file    Gets together: Not on file    Attends religious service: Not on file    Active member of club or organization: Not on file    Attends meetings of clubs or organizations: Not on file    Relationship  status: Not on file  Other Topics Concern  . Not on file  Social History Narrative   Patient grew up in [place], [place]. He is separated from his wife since 35. They never divorced. Patient has 1 son and 2 daughters. 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   Additional Social History:    Allergies:   Allergies  Allergen Reactions  . Latex Itching    Labs:  Results for orders placed or performed during the hospital encounter of 04/20/18 (from the past 48 hour(s))  Ammonia     Status: Abnormal   Collection Time: 04/20/18  4:13 PM  Result Value Ref  Range   Ammonia <9 (L) 9 - 35 umol/L    Comment: Performed at [facility], [address] [place], [place] [address]  Glucose, capillary     Status: Abnormal   Collection Time: 04/20/18  8:07 PM  Result Value Ref Range   Glucose-Capillary 194 (H) 70 - 99 mg/dL   Comment 1 Document in Chart   Glucose, capillary     Status: Abnormal   Collection Time: 04/21/18  5:34 PM  Result Value Ref Range   Glucose-Capillary 597 (HH) 70 - 99 mg/dL   Comment 1 Document in Chart    Comment 2 Call MD NNP PA CNM   Glucose, capillary     Status: Abnormal   Collection Time: 04/21/18  5:36 PM  Result Value Ref Range   Glucose-Capillary 261 (H) 70 - 99 mg/dL  Glucose, capillary     Status: Abnormal   Collection Time: 04/21/18  5:40 PM  Result Value Ref Range   Glucose-Capillary 181 (H) 70 - 99 mg/dL    Current Facility-Administered Medications  Medication Dose Route Frequency Provider Last Rate Last Dose  . acetaminophen (TYLENOL) tablet 650 mg  650 mg Oral Q6H PRN Patrecia Pour, NP   650 mg at 04/21/18 1741  . atorvastatin (LIPITOR) tablet 80 mg  80 mg Oral q1800 Patrecia Pour, NP   80 mg at 04/21/18 1838  . fluticasone (FLONASE) 50 MCG/ACT nasal spray 2 spray  2 spray Each Nare Daily Patrecia Pour, NP   2 spray at 04/22/18 0925  . glimepiride (AMARYL) tablet 4 mg  4 mg Oral Q breakfast Patrecia Pour, NP   4 mg at  04/22/18 8588  . metFORMIN (GLUCOPHAGE) tablet 500 mg  500 mg Oral BID Patrecia Pour, NP   500 mg at 04/22/18 5027  . OLANZapine zydis (ZYPREXA) disintegrating tablet 2.5 mg  2.5 mg Oral TID PRN Solon Palm R, DO   2.5 mg at 04/22/18 0310  . OLANZapine zydis (ZYPREXA) disintegrating tablet 2.5 mg  2.5 mg Oral BID Solon Palm R, DO      . pantoprazole (PROTONIX) EC tablet 40 mg  40 mg Oral Daily Patrecia Pour, NP   40 mg at 04/22/18 7412  . PARoxetine (PAXIL) tablet 40 mg  40 mg Oral Daily Patrecia Pour, NP   40 mg at 04/22/18 8786  . polyethylene glycol (MIRALAX / GLYCOLAX) packet 17 g  17 g Oral BID Nena Polio, MD   17 g at 04/21/18 2232  . primidone (MYSOLINE) tablet 50 mg  50 mg Oral BID Patrecia Pour, NP   50 mg at 04/22/18 7672  . tiotropium (SPIRIVA) inhalation capsule ([facility] use ONLY) 18 mcg  18 mcg Inhalation Daily Patrecia Pour, NP   18 mcg at 04/22/18 0947   Current Outpatient Medications  Medication Sig Dispense Refill  . traZODone (DESYREL) 50 MG tablet Take 50-100 mg by mouth Nightly.    Marland Kitchen acetaminophen (TYLENOL) 325 MG tablet Take 2 tablets (650 mg total) by mouth every 6 (six) hours as needed for mild pain (or Fever >/= 101).    Marland Kitchen atorvastatin (LIPITOR) 80 MG tablet TAKE 1 TABLET ONE TIME DAILY 90 tablet 3  . blood glucose meter kit and supplies KIT Dispense based on patient and insurance preference. Use up to four times daily as directed. 1 each 0  . fluticasone (FLONASE) 50 MCG/ACT nasal spray Place 2 sprays into both nostrils daily. 16 g  6  . glimepiride (AMARYL) 4 MG tablet take 1 tablet by mouth WITH BREAKFAST. 90 tablet 1  . lisinopril (PRINIVIL,ZESTRIL) 20 MG tablet Take 1 tablet (20 mg total) by mouth daily. 90 tablet 1  . meclizine (ANTIVERT) 12.5 MG tablet Take 1 tablet (12.5 mg total) by mouth 3 (three) times daily as needed for dizziness. 30 tablet 2  . metFORMIN (GLUCOPHAGE) 500 MG tablet TAKE 1 TABLET BY MOUTH TWICE A DAY 180 tablet 2  .  pantoprazole (PROTONIX) 40 MG tablet Take 1 tablet (40 mg total) by mouth daily. 90 tablet 1  . PARoxetine (PAXIL) 40 MG tablet Take 1 tablet (40 mg total) by mouth daily. 90 tablet 1  . primidone (MYSOLINE) 50 MG tablet Take 1.5 tablets (75 mg total) by mouth 2 (two) times daily. (Patient taking differently: Take 50 mg by mouth 2 (two) times daily. ) 270 tablet 1  . tiotropium (SPIRIVA) 18 MCG inhalation capsule Place 18 mcg as needed into inhaler and inhale.       Musculoskeletal: Strength & Muscle Tone: decreased Gait & Station: unsteady Patient leans: N/A  Psychiatric Specialty Exam: Physical Exam  Nursing note and vitals reviewed. Constitutional: He appears well-developed and well-nourished.  HENT:  Head: Normocephalic.  Neck: Normal range of motion.  Respiratory: Effort normal.  Musculoskeletal: Normal range of motion.  Neurological: He is alert.  Psychiatric: His mood appears anxious. Thought content is paranoid. Cognition and memory are impaired. He expresses inappropriate judgment. He exhibits abnormal recent memory.    Review of Systems  Neurological: Positive for tremors.  Psychiatric/Behavioral: Positive for memory loss.  All other systems reviewed and are negative.   Blood pressure 131/61, pulse 65, temperature 98.3 F (36.8 C), temperature source Oral, resp. rate 18, SpO2 99 %.There is no height or weight on file to calculate BMI.  General Appearance: Disheveled and Covered in food  Eye Contact:  Poor  Speech:  Clear, rambling  Volume:  Normal  Mood:  "I have been fine since I was born"  Affect:  Anxious, confused  Thought Process: Perseveration on paranoid content, also tangential  Orientation: Self only  Thought Content: Denies AVH, some paranoid ideation  Suicidal Thoughts:  No  Homicidal Thoughts:  No  Memory:  Immediate;   Poor Recent;   Poor Remote;   Fair  Judgement:  Impaired  Insight:  Lacking  Psychomotor Activity:  Tremor  Concentration:   Concentration: Poor and Attention Span: Poor  Recall:  Poor  Fund of Knowledge:  Fair  Language:  Good  Akathisia:  No  Handed:  Right  AIMS (if indicated):     Assets:  Housing Leisure Time Resilience Social Support  ADL's:  Impaired  Cognition:  Impaired,  Moderate  Sleep:   Midwife Summary:  After reviewing his notes, patient appears to have an acute decline which would provide more of an argument for a delirium picture rather than a worsening dementia.  His daughter Chase Simmons was contacted and informed of his psychiatric picture at this time.  I am hopeful that his recent colonic catharsis will improve his cognition, he may require medical admission for further evaluation of his delirium if he does not improve.  We will start on scheduled Zyprexa to aid with delirium, his daughter mentioned that he has a history of cardiac disease.  Would avoid Geodon or IV Haldol.  Altered mental status: -Close monitoring -Agree with bowel regimen -We will place on Zyprexa Zydis 2.15m p.o.  q. 1600 and QHS to assist with delirium - PRN Zyprexa 2.5 mg p.o. 3 times daily for agitation -Would recommend limiting use of benzodiazepines, or anticholinergic medications - If the patient is reporting pruritis would recommend topical ointments  Depression/anxiety - contacted his family regarding his paxil, we do not need to make changes immediately, would recommend changing to a less anticholinergic SSRI such as zoloft 8m po q daily vs prozac 276mpo q daily and titrating from there.   Disposition: If his cognition does not improve following his bowel movements and medication adjustments, may warrant a inpatient admission for work-up of altered mental status/delirium.  Reports he had a history of bowel obstruction with a similar presentation in the past.  Should his cognition improve he would be appropriate for disposition to a skilled nursing facility  JuChong SicilianDO 04/22/2018 1:39  PM

## 2018-04-22 NOTE — ED Notes (Signed)
Pt assisted back into bed from recliner.

## 2018-04-22 NOTE — ED Notes (Signed)
Pt resting quietly with eyes closed, resp even and unlabored

## 2018-04-22 NOTE — TOC Progression Note (Signed)
TOC CM/SW Spoke to pt's family regarding SNF. Family had questions regarding [facility]. SW informed family that requests has been sent out to facilities in the catchments area.  Permission given to sW expand search.   Berenice Bouton, MSW, LCSW  [phone] 8am-6pm (weekends) or CSW ED # [phone]

## 2018-04-22 NOTE — ED Notes (Signed)
Family updated via phone and phone given to pt to talk to daughter. Pt also given lunch tray at this time.

## 2018-04-23 DIAGNOSIS — E119 Type 2 diabetes mellitus without complications: Secondary | ICD-10-CM | POA: Diagnosis not present

## 2018-04-23 DIAGNOSIS — R4182 Altered mental status, unspecified: Secondary | ICD-10-CM | POA: Diagnosis not present

## 2018-04-23 DIAGNOSIS — Z8551 Personal history of malignant neoplasm of bladder: Secondary | ICD-10-CM | POA: Diagnosis not present

## 2018-04-23 DIAGNOSIS — R69 Illness, unspecified: Secondary | ICD-10-CM | POA: Diagnosis not present

## 2018-04-23 DIAGNOSIS — Z794 Long term (current) use of insulin: Secondary | ICD-10-CM | POA: Diagnosis not present

## 2018-04-23 DIAGNOSIS — I110 Hypertensive heart disease with heart failure: Secondary | ICD-10-CM | POA: Diagnosis not present

## 2018-04-23 DIAGNOSIS — R410 Disorientation, unspecified: Secondary | ICD-10-CM | POA: Diagnosis not present

## 2018-04-23 DIAGNOSIS — F329 Major depressive disorder, single episode, unspecified: Secondary | ICD-10-CM

## 2018-04-23 DIAGNOSIS — F419 Anxiety disorder, unspecified: Secondary | ICD-10-CM | POA: Diagnosis not present

## 2018-04-23 DIAGNOSIS — Z79899 Other long term (current) drug therapy: Secondary | ICD-10-CM | POA: Diagnosis not present

## 2018-04-23 DIAGNOSIS — G3184 Mild cognitive impairment, so stated: Secondary | ICD-10-CM | POA: Diagnosis not present

## 2018-04-23 DIAGNOSIS — Z87891 Personal history of nicotine dependence: Secondary | ICD-10-CM | POA: Diagnosis not present

## 2018-04-23 DIAGNOSIS — J449 Chronic obstructive pulmonary disease, unspecified: Secondary | ICD-10-CM | POA: Diagnosis not present

## 2018-04-23 DIAGNOSIS — I5042 Chronic combined systolic (congestive) and diastolic (congestive) heart failure: Secondary | ICD-10-CM | POA: Diagnosis not present

## 2018-04-23 LAB — FOLATE RBC
Folate, Hemolysate: 486 ng/mL
Folate, RBC: 1346 ng/mL (ref >498)
Hematocrit: 36.1 % — ABNORMAL LOW (ref 37.5–51.0)

## 2018-04-23 LAB — GLUCOSE, CAPILLARY: Glucose-Capillary: 597 mg/dL (ref 70–99)

## 2018-04-23 MED ORDER — OLANZAPINE 5 MG PO TBDP
2.5000 mg | ORAL_TABLET | Freq: Two times a day (BID) | ORAL | Status: DC
  Administered 2018-04-23: 2.5 mg via ORAL
  Filled 2018-04-23: qty 1

## 2018-04-23 NOTE — ED Notes (Signed)
Patient set up with dinner tray.

## 2018-04-23 NOTE — ED Notes (Signed)
Report received from [employer], Therapist, sports. Pt care assumed at this time.

## 2018-04-23 NOTE — ED Notes (Signed)
Patient helped to toilet and assisted back to recliner

## 2018-04-23 NOTE — ED Notes (Signed)
Pt assisted to use the urinal at bedside. Urine output 200 mL's, pt now requesting to speak with other daughter, [name], over the phone. RN dialed daughter and updated her on pt status and pt spoke with daughter for approximately 15 minutes. Pt is now resting repositioned in bed, lights turned down to allow pt to rest, call bell is within reach, bed in low and locked position.

## 2018-04-23 NOTE — Progress Notes (Signed)
11:45am -CSW received bed offer from [facility]. CSW contacted daughter, [name], and she stated that she would like pt to go to [facility] instead.  CSW informed Otila Kluver from [employer] of family's decision. Otila Kluver stated that she will begin authorization process and there will be a bed available for pt tomorrow (04/24/2018).    Kenney Houseman [facility] ED  [phone]

## 2018-04-23 NOTE — ED Notes (Addendum)
Following phone call with daughter Rn proceeded with hygiene care. Bed bath completed with CHG wipes. Brief and gown changed. Lotion applied. Face washed with wash cloth. Pt repositioned in bed. Call bell remains within reach, bed in low and locked position, TV remote within reach and lights adjusted for comfort. VSS.

## 2018-04-23 NOTE — ED Notes (Signed)
Patients lunch tray set up

## 2018-04-23 NOTE — Progress Notes (Signed)
10:28am - CSW contacted Magda Paganini at [facility] referring to bed offer. Magda Paganini wanted to know if pt is currently receiving cancer treatment before he is accepted.  10:35am - CSW contacted pt's daughter, [name] to ask if pt is receiving cancer treatment. [name] reported that he is not. [name] stated that she is okay with pt going to [facility].  [name] stated that pt's other daughter, [name], would like to speak with Psychiatrist.  CSW spoke with Psychiatrist, and she stated that she will contact pt's daughter.  11:09am- CSW left message with Magda Paganini at [facility]. CSW awaiting for Magda Paganini to return phone call regarding bed offer.    [place] [place] ED  [phone]

## 2018-04-23 NOTE — Progress Notes (Signed)
8:45am - CSW contacted [facility] to see if bed was still available for this pt. Voicemail message was left.     Fredric Mare, LCSW  [phone] [facility] ED

## 2018-04-23 NOTE — ED Notes (Signed)
Patient helped into recliner from bed

## 2018-04-23 NOTE — ED Notes (Signed)
Patients daughter tammy contacted and was able to speak with patient and this RN

## 2018-04-23 NOTE — ED Notes (Signed)
PAtient helped by this RN and Presenter, broadcasting to recliner in room. Patient given breakfast tray. Call bell in reach

## 2018-04-23 NOTE — ED Notes (Signed)
Patient attempted to get out of bed. Reoriented to place and time. Reminded patient to get his nap before breakfast. He is amenable to same

## 2018-04-23 NOTE — ED Notes (Signed)
Introduced to patient. Pt in recliner at this time following dinner. Pt ate 3/4th of his dinner. Pt ready to transition back into bed at this time. 2 assist required for pt transfer. Pt provided with a tooth brush and toothpaste and oral hygiene completed independently by patient. Pt is currently speaking with daughter, Dondra Spry, over the phone. Call bell within reach, Urinal at bedside. Bed in low and locked position and tv remote within reach.

## 2018-04-23 NOTE — ED Notes (Signed)
Psychiatrist at bedside

## 2018-04-23 NOTE — ED Notes (Signed)
Patient calling out to RN and wants to speak to his daughter. Reoriented patient to the time and that he was in the emergency room. He stated "oh that's right." Patient repositioned in bed for comfort.

## 2018-04-23 NOTE — ED Notes (Signed)
Patient sitting in recliner, reports being cold.  Patient given blankets for warmth.

## 2018-04-23 NOTE — ED Notes (Signed)
PT at bedside to work with patient.

## 2018-04-23 NOTE — ED Notes (Signed)
Patient sitting on side of bed.  Patient oriented to time.  Patient states he is tired of being in the bed.  Patient up to chair with 2 person assist.

## 2018-04-23 NOTE — ED Provider Notes (Signed)
-----------------------------------------   5:52 AM on 04/23/2018 -----------------------------------------   Blood pressure (!) 142/62, pulse 98, temperature 98.5 F (36.9 C), temperature source Oral, resp. rate 16, SpO2 95 %.  The patient is calm and cooperative at this time.  There have been no acute events since the last update.  Awaiting disposition plan from clinical social work team.   Paulette Blanch, MD 04/23/18 [phone]

## 2018-04-23 NOTE — Consult Note (Signed)
[place] [facility] Consult   Reason for Consult: Reevaluation for placement Referring Physician:  EDP Patient Identification: Chase Simmons MRN:  [id number] Principal Diagnosis:Confusion, altered mental status Diagnosis:  Altered mental status  Patient is seen, chart is reviewed, collateral obtained from patient's daughter, Chase Simmons and diagnosis and medication education provided to daughter. Total Time spent with patient: 35 min    Subjective: "Why cannot my daughters come visit?"  HPI:  36 yo M brought in his daughter for increase in confusion needing a higher level of care than his current independent living facility.  His increase in confusion started two weeks ago resulting in confusion, paranoia and wandering.  He received PRN olanzapine this morning at 3:00, with fair effect. He was able to defecate this morning following an enema.  Sleep was not recorded but per the notes it appears that he slept about 3 to 4 hours, following his olanzapine administration.  Otherwise no acute event.  From initial psychiatric intake: Chase Simmons is a 85 y.o. male patient admitted with confusion and wandering. Assessed for capacity, [name] does not have the mental capacity at this time to make safe medical decisions based on his state of confusion, he is unable to make consistent choices, rationalize information, appreciate his condition and appears impaired by his altered mental status. This morning on interview the patient was found laying in his bed, watching television. He continues to be notably confused, and appeared more anxious and paranoid compared to yesterday. He was awake alert and oriented only to himself, he perseverated that he is currently in a jail.  When he was educated about his current surroundings being a hospital, he started demanding that the hospital pay him for his time.  He denied feeling pain, constipation, dysuria.  When he was gently confronted that he might be  mildly confused he responded "that is horse shit" and went on to defend himself stating that he is just been "trucking for a while".  His daughter Chase Simmons was contacted at [phone], she was able to confirm that the patient has had worsening mental status and confusion over the past 3 weeks.  She reviewed his past pertinent medication changes, noted that they have been giving him meclizine daily, and that his Paxil has been effective for several years for his history of severe anxiety, OCD and history of panic attacks.  She was provided informed consent to administering olanzapine q. 1600 and QHS, assist in managing his confusion and behaviors while we work on his delirium.  She was in agreement with the above plan.  On reevaluation today, patient is alert and sitting in a chair.  He is oriented to name, place and year.  Patient is mildly disoriented to situation and current events.  Patient is talkative and pleasant.  Patient has not had any aggressive behaviors, and is redirectable with his wandering.  Patient denies any past self-harm.  He denies suicidal ideation, homicidal ideation, or auditory or visual hallucinations.  Past Psychiatric History: Depression, anxiety, OCD, panic attacks, delirium  Risk to Self:  wandering Risk to Others:  none Prior Inpatient Therapy:  not psychiatric Prior Outpatient Therapy:  none  Daughter, Chase Simmons states patient has been at [facility] twice in the past.  She clearly describes patient having similar episodes of confusion, which tend to clear.  She specifically recalls a past time her patient had severe constipation which precipitated his delirium.  Patient was treated for a urinary tract infection by his outpatient provider within  the past few weeks.  (Of note UA on arrival to ED was negative for UTI).  She reports that patient seemed more oriented today, but she and her sister are concerned about bringing him to their home as they work and would be concerned  for his wandering from their homes. Daughter is advised of patient's current medical work-up, diagnosis of delirium versus dementia, medications to treat delirium which will currently be scheduled and transition to as needed to minimize risk of side effect of antipsychotics.  Past Medical History:  Past Medical History:  Diagnosis Date  . Allergy   . Bladder cancer ([place])    a. followed by Dr. Jacqlyn Larsen  . Chronic combined systolic (congestive) and diastolic (congestive) heart failure ([place])    a. 07/2016 Echo: EF 30-35%, sev mid-apicalanteroseptal, ant, inf HK, Gr1 DD.  Marland Kitchen COPD (chronic obstructive pulmonary disease) ([place])   . Hyperlipidemia   . Hypertension   . NICM (nonischemic cardiomyopathy) ([place])    a. 07/2016 Echo: EF 30-35%, sev mid-apicalanteroseptal, ant, inf HK, Gr1 DD, mild MR, mildly dil LA, PASP 61mHg - ? stress induced CM.  .Marland KitchenNonobstructive Coronary atherosclerosis    a. 07/2016 Cath: LM nl, LAD nl, D1 40ost, LCX 249mRCA nl, EF 25-30%.  . Marland KitchenAF ([place]oxysmal atrial fibrillation) ([facility]   a. Dx 08/2013. CHA2DS2VASc = 6-->no OAC 2/2 h/o falls and nocompliance.  . Tremor, essential    sees [facility] neurologist  . Type 2 diabetes mellitus with complication, without long-term current use of insulin [place]  . Urinary incontinence     Past Surgical History:  Procedure Laterality Date  . bladder cancer    . CATARACT EXTRACTION    . HERNIA REPAIR    . LEFT HEART CATH AND CORONARY ANGIOGRAPHY N/A 07/11/2016   Procedure: Left Heart Cath and Coronary Angiography;  Surgeon: ArWellington HampshireMD;  [facility];  Service: Cardiovascular;  Laterality: N/A;   Family History:  Family History  Problem Relation Age of Onset  . Arthritis Mother   . Dementia Mother   . Cancer Maternal Uncle        prostate  . Cancer Maternal Aunt        breast cancer   Family Psychiatric  History: mother with dementia  Social History:  Social History   Substance and Sexual Activity   Alcohol Use Yes  . Alcohol/week: 12.0 standard drinks  . Types: 12 Standard drinks or equivalent per week  . Frequency: Never   Comment: 1 drink with dinner     Social History   Substance and Sexual Activity  Drug Use No    Social History   Socioeconomic History  . Marital status: Legally Separated    Spouse name: Not on file  . Number of children: 3  . Years of education: 1239. Highest education level: Not on file  Occupational History  . 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 Financial resource strain: Not hard at all  . Food insecurity:    Worry: Never true    Inability: Never true  . Transportation needs:    Medical: No    Non-medical: No  Tobacco Use  . Smoking status: Former Smoker    Packs/day: 1.50    Years: 40.00    Pack years: 60.00    Last attempt to quit: 02/03/2003    Years since quitting: 15.2  . Smokeless tobacco: Never Used  Substance and Sexual Activity  . Alcohol  use: Yes    Alcohol/week: 12.0 standard drinks    Types: 12 Standard drinks or equivalent per week    Frequency: Never    Comment: 1 drink with dinner  . Drug use: No  . Sexual activity: Never  Lifestyle  . Physical activity:    Days per week: 0 days    Minutes per session: Not on file  . Stress: Not at all  Relationships  . Social connections:    Talks on phone: Not on file    Gets together: Not on file    Attends religious service: Not on file    Active member of club or organization: Not on file    Attends meetings of clubs or organizations: Not on file    Relationship status: Not on file  Other Topics Concern  . Not on file  Social History Narrative   Patient grew up in [place], [place]. He is separated from his wife since 53. They never divorced. Patient has 1 son and 2 daughters. 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   Additional Social History:  Patient had been living independently prior to coming to the hospital.  He has been having more  difficulty caring for himself.  Allergies:   Allergies  Allergen Reactions  . Latex Itching    Labs:  Results for orders placed or performed during the hospital encounter of 04/20/18 (from the past 48 hour(s))  Glucose, capillary     Status: Abnormal   Collection Time: 04/21/18  5:34 PM  Result Value Ref Range   Glucose-Capillary 597 (HH) 70 - 99 mg/dL   Comment 1 Document in Chart    Comment 2 Call MD NNP PA CNM   Glucose, capillary     Status: Abnormal   Collection Time: 04/21/18  5:36 PM  Result Value Ref Range   Glucose-Capillary 261 (H) 70 - 99 mg/dL  Glucose, capillary     Status: Abnormal   Collection Time: 04/21/18  5:40 PM  Result Value Ref Range   Glucose-Capillary 181 (H) 70 - 99 mg/dL  Vitamin B12     Status: None   Collection Time: 04/22/18  3:44 PM  Result Value Ref Range   Vitamin B-12 548 180 - 914 pg/mL    Comment: (NOTE) This assay is not validated for testing neonatal or myeloproliferative syndrome specimens for Vitamin B12 levels. Performed at [facility], [place] [address], [place], [place] [address]     Current Facility-Administered Medications  Medication Dose Route Frequency Provider Last Rate Last Dose  . acetaminophen (TYLENOL) tablet 650 mg  650 mg Oral Q6H PRN Patrecia Pour, NP   650 mg at 04/21/18 1741  . atorvastatin (LIPITOR) tablet 80 mg  80 mg Oral q1800 Patrecia Pour, NP   80 mg at 04/22/18 1738  . fluticasone (FLONASE) 50 MCG/ACT nasal spray 2 spray  2 spray Each Nare Daily Patrecia Pour, NP   2 spray at 04/23/18 [phone]  . glimepiride (AMARYL) tablet 4 mg  4 mg Oral Q breakfast Patrecia Pour, NP   4 mg at 04/23/18 0854  . metFORMIN (GLUCOPHAGE) tablet 500 mg  500 mg Oral BID Patrecia Pour, NP   500 mg at 04/23/18 0853  . OLANZapine zydis (ZYPREXA) disintegrating tablet 2.5 mg  2.5 mg Oral TID PRN Solon Palm R, DO   2.5 mg at 04/22/18 0310  . OLANZapine zydis (ZYPREXA) disintegrating tablet 2.5 mg  2.5 mg Oral  BID Solon Palm R, DO   2.5 mg at 04/22/18 2018  . pantoprazole (PROTONIX) EC tablet 40 mg  40 mg Oral Daily Patrecia Pour, NP   40 mg at 04/23/18 0853  . PARoxetine (PAXIL) tablet 40 mg  40 mg Oral Daily Patrecia Pour, NP   40 mg at 04/23/18 0949  . polyethylene glycol (MIRALAX / GLYCOLAX) packet 17 g  17 g Oral BID Nena Polio, MD   17 g at 04/23/18 0949  . primidone (MYSOLINE) tablet 50 mg  50 mg Oral BID Patrecia Pour, NP   50 mg at 04/23/18 0950  . tiotropium (SPIRIVA) inhalation capsule ([facility] use ONLY) 18 mcg  18 mcg Inhalation Daily Patrecia Pour, NP   18 mcg at 04/23/18 2641   Current Outpatient Medications  Medication Sig Dispense Refill  . traZODone (DESYREL) 50 MG tablet Take 50-100 mg by mouth Nightly.    Marland Kitchen acetaminophen (TYLENOL) 325 MG tablet Take 2 tablets (650 mg total) by mouth every 6 (six) hours as needed for mild pain (or Fever >/= 101).    Marland Kitchen atorvastatin (LIPITOR) 80 MG tablet TAKE 1 TABLET ONE TIME DAILY 90 tablet 3  . blood glucose meter kit and supplies KIT Dispense based on patient and insurance preference. Use up to four times daily as directed. 1 each 0  . fluticasone (FLONASE) 50 MCG/ACT nasal spray Place 2 sprays into both nostrils daily. 16 g 6  . glimepiride (AMARYL) 4 MG tablet take 1 tablet by mouth WITH BREAKFAST. 90 tablet 1  . lisinopril (PRINIVIL,ZESTRIL) 20 MG tablet Take 1 tablet (20 mg total) by mouth daily. 90 tablet 1  . meclizine (ANTIVERT) 12.5 MG tablet Take 1 tablet (12.5 mg total) by mouth 3 (three) times daily as needed for dizziness. 30 tablet 2  . metFORMIN (GLUCOPHAGE) 500 MG tablet TAKE 1 TABLET BY MOUTH TWICE A DAY 180 tablet 2  . pantoprazole (PROTONIX) 40 MG tablet Take 1 tablet (40 mg total) by mouth daily. 90 tablet 1  . PARoxetine (PAXIL) 40 MG tablet Take 1 tablet (40 mg total) by mouth daily. 90 tablet 1  . primidone (MYSOLINE) 50 MG tablet Take 1.5 tablets (75 mg total) by mouth 2 (two) times daily. (Patient taking differently: Take  50 mg by mouth 2 (two) times daily. ) 270 tablet 1  . tiotropium (SPIRIVA) 18 MCG inhalation capsule Place 18 mcg as needed into inhaler and inhale.       Musculoskeletal: Strength & Muscle Tone: decreased Gait & Station: unsteady Patient leans: N/A  Psychiatric Specialty Exam: Physical Exam  Nursing note and vitals reviewed. Constitutional: He appears well-developed and well-nourished. No distress.  HENT:  Head: Normocephalic and atraumatic.  Eyes: EOM are normal.  Neck: Normal range of motion.  Cardiovascular: Normal rate and regular rhythm.  Respiratory: Effort normal. No respiratory distress.  Musculoskeletal: Normal range of motion.  Neurological: He is alert.  Psychiatric: His mood appears anxious. Thought content is paranoid. Cognition and memory are impaired. He expresses inappropriate judgment. He exhibits abnormal recent memory.    Review of Systems  Neurological: Positive for tremors.  Psychiatric/Behavioral: Positive for memory loss.  All other systems reviewed and are negative.   Blood pressure (!) 149/71, pulse 62, temperature 98.5 F (36.9 C), temperature source Oral, resp. rate 16, SpO2 97 %.There is no height or weight on file to calculate BMI.  General Appearance: Casual  Eye Contact:  Good  Speech:  Clear, rambling  Volume:  Normal  Mood:  Euthymic  Affect:  Full Range  Thought Process: tangential  Orientation: Self and year  Thought Content: Denies AVH  Suicidal Thoughts:  No  Homicidal Thoughts:  No  Memory:  Immediate;   Poor Recent;   Poor Remote;   Fair  Judgement:  Impaired  Insight:  Lacking  Psychomotor Activity:  [facility]  Concentration:  Concentration: Fair and Attention Span: Fair  Recall:  Poor  Fund of Knowledge:  Fair  Language:  Good  Akathisia:  No  Handed:  Right  AIMS (if indicated):     Assets:  Housing Leisure Time Resilience Social Support  ADL's:  Impaired  Cognition:  Impaired,   Moderate  Sleep:   Adequate overnight    Treatment Plan Summary:  Altered mental status: -Close monitoring -Continue bowel regimen -Continue Zyprexa Zydis 2.52m p.o. q. 1600 and QHS to assist with delirium - PRN Zyprexa 2.5 mg p.o. 3 times daily for agitation and plan to change to PRN dosing prior to discharge to SNF - Discussed with daughter Chase Simmons risk, benefits, side effects and adverse effects to include black box warning.  Daughter agreeable to medication mangagement plan.  -Would recommend limiting use of benzodiazepines, or anticholinergic medications - If the patient is reporting pruritis would recommend topical ointments -Do not prescribed benzodiazepines as this can exacerbate delirium  Depression/anxiety -Continue Paxil 40 mg daily.  Could consider changing to a less anticholinergic SSRI such as Zoloft 556mpo q daily vs Prozac 2055mo q daily and titration from there.  Disposition:  Appropriate for disposition to a skilled nursing facility  SHELavella HammockD 04/23/2018 12:41 PM

## 2018-04-23 NOTE — Progress Notes (Signed)
Physical Therapy Treatment Patient Details Name: Chase Simmons MRN: [id number] DOB: 1933/01/17 Today's Date: 04/23/2018    History of Present Illness 85 y/o male here with increased confusion, and generally weakness.      PT Comments    Patient agreeable to work with PT. Patient is able to follow cuing for proper form with therex and eccentric control following STS transfers. Over 3 trials patient is minA to rise and min-modA to remain standing. Patient has difficulty with bilat knee and hip ext in standing, which he is unable to correct despite cuing d/t fatigue and R knee pain. Patient reports R knee pain subsides following sitting. Would benefit from skilled PT to address above deficits and promote optimal return to PLOF    Follow Up Recommendations  SNF     Equipment Recommendations  None recommended by PT    Recommendations for Other Services       Precautions / Restrictions      Mobility  Bed Mobility                  Transfers Overall transfer level: Needs assistance Equipment used: Rolling walker (2 wheeled) Transfers: Sit to/from Stand Sit to Stand: Min assist;Mod assist         General transfer comment: Cuing needed for proper RW negotiation, patient able to stand with minA and required modA to remain standing. Despite PT cuing patient is unable to achieve proper hip and knee ext needed to complete stand and is unable to remain standing for more than a few seconds.   Ambulation/Gait   Gait Distance (Feet): 0 Feet         General Gait Details: PT encouraged patietn to take a small step, which patietn is unable to do   Stairs             Wheelchair Mobility    Modified Rankin (Stroke Patients Only)       Balance Overall balance assessment: Needs assistance Sitting-balance support: Single extremity supported Sitting balance-Leahy Scale: Good Sitting balance - Comments: Pt able to maintain sitting balance confidently   Standing  balance support: Bilateral upper extremity supported Standing balance-Leahy Scale: Poor Standing balance comment: Pt highly reliant on walker, unable to extend knees/attain upright posture.                            Cognition Arousal/Alertness: Awake/alert   Overall Cognitive Status: Impaired/Different from baseline Area of Impairment: Orientation                               General Comments: States name, year, president; does not recall place      Exercises General Exercises - Lower Extremity Long Arc Quad: AROM;Seated;Right;Left;20 reps(2x 10) Hip Flexion/Marching: AROM;Seated;Right;Left;20 reps(2x 10) Other Exercises Other Exercises: Patietn able to complete therex with visual target cuing for ROM and cuing for eccentric control with good success. PT led patient through STS 3x. Patient is able to demonstrate safety following PT cuing for set up and handplacement. patietn with minA for rise and min-modA to remain standing. Patietn is able to improve negotation with RW over 3 trials, but is unable to complete full bilat hip and knee ext despite PT TC and VC . Patient with complaints of R knee pain following transfers    General Comments        Pertinent Vitals/Pain Pain Assessment: No/denies pain  Home Living                      Prior Function            PT Goals (current goals can now be found in the care plan section) Acute Rehab PT Goals Patient Stated Goal: get the pain out of his knees PT Goal Formulation: With patient Time For Goal Achievement: 05/04/18 Potential to Achieve Goals: Fair    Frequency    Min 2X/week      PT Plan      Co-evaluation              AM-PAC PT "6 Clicks" Mobility   Outcome Measure  Help needed turning from your back to your side while in a flat bed without using bedrails?: A Little Help needed moving from lying on your back to sitting on the side of a flat bed without using bedrails?:  A Lot Help needed moving to and from a bed to a chair (including a wheelchair)?: A Lot Help needed standing up from a chair using your arms (e.g., wheelchair or bedside chair)?: A Little Help needed to walk in hospital room?: A Lot Help needed climbing 3-5 steps with a railing? : Total 6 Click Score: 13    End of Session Equipment Utilized During Treatment: Gait belt Activity Tolerance: Patient limited by pain Patient left: in bed;with call bell/phone within reach;with nursing/sitter in room Nurse Communication: Mobility status PT Visit Diagnosis: Muscle weakness (generalized) (M62.81);Difficulty in walking, not elsewhere classified (R26.2);Pain Pain - Right/Left: Right Pain - part of body: Knee     Time: 1343-1400 PT Time Calculation (min) (ACUTE ONLY): 17 min  Charges:  $Therapeutic Activity: 8-22 mins                     Shelton Silvas PT, DPT  Shelton Silvas 04/23/2018, 2:16 PM

## 2018-04-23 NOTE — ED Notes (Signed)
Patient called out for urinal. Patient assisted with urinal and unable to void.

## 2018-04-23 NOTE — ED Notes (Signed)
Patient assisted back in bed from recliner

## 2018-04-23 NOTE — ED Notes (Signed)
Patient perineum cleansed.  Condom cath had come off.  Patient placed in brief for incontinence.  Patient assisted back to bed with 2 person assist, given warm blankets for comfort.

## 2018-04-23 NOTE — ED Notes (Signed)
Patient helped to toilet by this RN and Presenter, broadcasting. Then helped back in recliner. Call bell in place

## 2018-04-23 NOTE — ED Notes (Signed)
Patient watching TV, no complaints at this time.

## 2018-04-24 ENCOUNTER — Telehealth: Payer: Self-pay

## 2018-04-24 DIAGNOSIS — E1165 Type 2 diabetes mellitus with hyperglycemia: Secondary | ICD-10-CM | POA: Diagnosis not present

## 2018-04-24 DIAGNOSIS — Z743 Need for continuous supervision: Secondary | ICD-10-CM | POA: Diagnosis not present

## 2018-04-24 DIAGNOSIS — Z8551 Personal history of malignant neoplasm of bladder: Secondary | ICD-10-CM | POA: Diagnosis not present

## 2018-04-24 DIAGNOSIS — Z794 Long term (current) use of insulin: Secondary | ICD-10-CM | POA: Diagnosis not present

## 2018-04-24 DIAGNOSIS — D649 Anemia, unspecified: Secondary | ICD-10-CM | POA: Diagnosis not present

## 2018-04-24 DIAGNOSIS — G4700 Insomnia, unspecified: Secondary | ICD-10-CM | POA: Diagnosis not present

## 2018-04-24 DIAGNOSIS — J449 Chronic obstructive pulmonary disease, unspecified: Secondary | ICD-10-CM | POA: Diagnosis not present

## 2018-04-24 DIAGNOSIS — R4182 Altered mental status, unspecified: Secondary | ICD-10-CM | POA: Diagnosis not present

## 2018-04-24 DIAGNOSIS — R319 Hematuria, unspecified: Secondary | ICD-10-CM | POA: Diagnosis not present

## 2018-04-24 DIAGNOSIS — Z5189 Encounter for other specified aftercare: Secondary | ICD-10-CM | POA: Diagnosis not present

## 2018-04-24 DIAGNOSIS — I110 Hypertensive heart disease with heart failure: Secondary | ICD-10-CM | POA: Diagnosis not present

## 2018-04-24 DIAGNOSIS — E119 Type 2 diabetes mellitus without complications: Secondary | ICD-10-CM | POA: Diagnosis not present

## 2018-04-24 DIAGNOSIS — I509 Heart failure, unspecified: Secondary | ICD-10-CM | POA: Diagnosis not present

## 2018-04-24 DIAGNOSIS — R279 Unspecified lack of coordination: Secondary | ICD-10-CM | POA: Diagnosis not present

## 2018-04-24 DIAGNOSIS — R69 Illness, unspecified: Secondary | ICD-10-CM | POA: Diagnosis not present

## 2018-04-24 DIAGNOSIS — K219 Gastro-esophageal reflux disease without esophagitis: Secondary | ICD-10-CM | POA: Diagnosis not present

## 2018-04-24 DIAGNOSIS — I10 Essential (primary) hypertension: Secondary | ICD-10-CM | POA: Diagnosis not present

## 2018-04-24 DIAGNOSIS — R001 Bradycardia, unspecified: Secondary | ICD-10-CM

## 2018-04-24 DIAGNOSIS — N390 Urinary tract infection, site not specified: Secondary | ICD-10-CM | POA: Diagnosis not present

## 2018-04-24 DIAGNOSIS — E785 Hyperlipidemia, unspecified: Secondary | ICD-10-CM | POA: Diagnosis not present

## 2018-04-24 DIAGNOSIS — Z79899 Other long term (current) drug therapy: Secondary | ICD-10-CM | POA: Diagnosis not present

## 2018-04-24 DIAGNOSIS — R410 Disorientation, unspecified: Secondary | ICD-10-CM | POA: Diagnosis not present

## 2018-04-24 DIAGNOSIS — I4891 Unspecified atrial fibrillation: Secondary | ICD-10-CM | POA: Diagnosis not present

## 2018-04-24 DIAGNOSIS — I5042 Chronic combined systolic (congestive) and diastolic (congestive) heart failure: Secondary | ICD-10-CM | POA: Diagnosis not present

## 2018-04-24 DIAGNOSIS — Z87891 Personal history of nicotine dependence: Secondary | ICD-10-CM | POA: Diagnosis not present

## 2018-04-24 DIAGNOSIS — G250 Essential tremor: Secondary | ICD-10-CM | POA: Diagnosis not present

## 2018-04-24 DIAGNOSIS — R41841 Cognitive communication deficit: Secondary | ICD-10-CM | POA: Diagnosis not present

## 2018-04-24 DIAGNOSIS — M6281 Muscle weakness (generalized): Secondary | ICD-10-CM | POA: Diagnosis not present

## 2018-04-24 LAB — GLUCOSE, CAPILLARY: Glucose-Capillary: 229 mg/dL — ABNORMAL HIGH (ref 70–99)

## 2018-04-24 NOTE — ED Notes (Signed)
Pt attempting to get out of bed with poor redirection. Zyprexa administered. Sitter at bedside.

## 2018-04-24 NOTE — ED Notes (Signed)
Physical Therapy at bedside.

## 2018-04-24 NOTE — Progress Notes (Signed)
Physical Therapy Treatment Patient Details Name: Chase Simmons MRN: [id number] DOB: [birth year] Today's Date: 04/24/2018    History of Present Illness 85 y/o male here with increased confusion, and generally weakness.      PT Comments    Patient with better trunk control this session decreasing caregiver burden to minA with supine > sit >stand. Patient is able recall proper RW negotiation and hand placement with standing, requires minA to rise and cuing in standing for upright posture without forward trunk flexion, and with TKE< and hip ext. Patient is able to complete therex with accuracy following visual and verbal cuing. Would benefit from skilled PT to address above deficits and promote optimal return to PLOF    Follow Up Recommendations  SNF     Equipment Recommendations  None recommended by PT    Recommendations for Other Services       Precautions / Restrictions      Mobility  Bed Mobility Overal bed mobility: Needs Assistance Bed Mobility: Supine to Sit;Sit to Supine     Supine to sit: Min assist Sit to supine: Min assist   General bed mobility comments: Patient requires minA for bed mobility, much better trunk mobility than yesterday, some assistance with LE d/t weakness  Transfers Overall transfer level: Needs assistance Equipment used: Rolling walker (2 wheeled) Transfers: Sit to/from Stand Sit to Stand: Min assist         General transfer comment: Patient with decent carry over of setup and hand placement this session and min assist required to stand with better ability to comply with hip and knee ext cuing this session. Good controll with sitting from standing noted  Ambulation/Gait Ambulation/Gait assistance: Min assist Gait Distance (Feet): 1 Feet Assistive device: Rolling walker (2 wheeled) Gait Pattern/deviations: Shuffle     General Gait Details: Able to take 2 small steps forward and backward before reporting his R need was buckiling.     Stairs             Wheelchair Mobility    Modified Rankin (Stroke Patients Only)       Balance                                            Cognition Arousal/Alertness: Awake/alert Behavior During Therapy: WFL for tasks assessed/performed Overall Cognitive Status: Impaired/Different from baseline Area of Impairment: Orientation                               General Comments: States name, year, president; does not recall place      Exercises General Exercises - Lower Extremity Hip Flexion/Marching: AROM;Right;Left;Standing(x8 reps bilat; x9 reps bilat with RW) Other Exercises Other Exercises: Patietn with good carry over of set up for RW management with STS transfer, requiring min assist for standing and better compliance with cuing to ext hips and knees with upright posture. Patient able to take 2 shuffle steps forward and backward before knee buckle causing him to have to sit. Patient is able to complete 2 sets of standing marching with breaks between and with heavy reliance on UE in RW with cuing for full ROM and eccentric control     General Comments        Pertinent Vitals/Pain Pain Assessment: No/denies pain    Home Living  Prior Function            PT Goals (current goals can now be found in the care plan section) Acute Rehab PT Goals Patient Stated Goal: get the pain out of his knees PT Goal Formulation: With patient Time For Goal Achievement: 05/04/18 Potential to Achieve Goals: Fair Progress towards PT goals: Progressing toward goals    Frequency    Min 2X/week      PT Plan      Co-evaluation              AM-PAC PT "6 Clicks" Mobility   Outcome Measure  Help needed turning from your back to your side while in a flat bed without using bedrails?: A Little Help needed moving from lying on your back to sitting on the side of a flat bed without using bedrails?: A Lot Help  needed moving to and from a bed to a chair (including a wheelchair)?: A Lot Help needed standing up from a chair using your arms (e.g., wheelchair or bedside chair)?: A Little Help needed to walk in hospital room?: A Lot Help needed climbing 3-5 steps with a railing? : Total 6 Click Score: 13    End of Session Equipment Utilized During Treatment: Gait belt Activity Tolerance: Patient limited by fatigue Patient left: in bed;with call bell/phone within reach;with nursing/sitter in room Nurse Communication: Mobility status PT Visit Diagnosis: Muscle weakness (generalized) (M62.81);Difficulty in walking, not elsewhere classified (R26.2);Pain Pain - Right/Left: Right Pain - part of body: Knee     Time: 1430-1458 PT Time Calculation (min) (ACUTE ONLY): 28 min  Charges:  $Therapeutic Activity: 23-37 mins                     Shelton Silvas PT, DPT   Shelton Silvas 04/24/2018, 3:24 PM

## 2018-04-24 NOTE — ED Notes (Signed)
Patient called out and told this RN when entering room "I didn't mean to press button." Patient given his coffee to drink

## 2018-04-24 NOTE — ED Notes (Signed)
Patient assisted to toilet from bed. Patient then cleaned and assisted to recliner. Call bell in arms reach. Water on bedside table. Remote to tv in reach

## 2018-04-24 NOTE — NC FL2 (Signed)
[place] LEVEL OF CARE SCREENING TOOL     IDENTIFICATION  Patient Name: Chase Simmons Birthdate: 1933/05/10 Sex: male Admission Date (Current Location): 04/20/2018  [place] and [place] Number:  Engineering geologist and Address:  [facility], [address], [place], [place] [address]      Provider Number: [id number]  Attending Physician Name and Address:  No att. providers found  Relative Name and Phone Number:  Chase, Simmons    Daughter   [phone]     Current Level of Care: Hospital Recommended Level of Care: [place] Prior Approval Number:    Date Approved/Denied:   PASRR Number: [id number]  Discharge Plan: SNF    Current Diagnoses: Patient Active Problem List   Diagnosis Date Noted  . Depression 04/20/2018  . Altered mental status   . Frequency of urination 03/01/2018  . Confusion 03/01/2018  . Bilateral leg edema 11/08/2016  . Vertigo 08/25/2016  . Nausea 08/01/2016  . CHF (congestive heart failure) ([place]) 08/01/2016  . Atypical chest pain 07/20/2016  . COPD (chronic obstructive pulmonary disease) ([place]) 03/15/2016  . Coronary atherosclerosis of native coronary artery 01/05/2016  . Aortic atherosclerosis ([place]) 01/05/2016  . Bradycardia 01/05/2016  . Anxiety 06/17/2015  . Malignant neoplasm of urinary bladder ([place]) 06/17/2015  . Allergic rhinitis 05/14/2015  . Knee pain, bilateral 03/16/2015  . PAF (paroxysmal atrial fibrillation) ([place]) 08/30/2013  . Acid reflux 08/30/2013  . Tremor, essential 10/22/2012  . Hyperlipidemia 10/22/2012  . HTN (hypertension) 08/01/2012  . Type 2 diabetes mellitus with complication, without long-term current use of insulin ([place]) 08/01/2012  . Benign prostatic hyperplasia with urinary obstruction 09/02/2011    Orientation RESPIRATION BLADDER Height & Weight     Self  Normal Incontinent, External catheter Weight:   Height:     BEHAVIORAL SYMPTOMS/MOOD NEUROLOGICAL  BOWEL NUTRITION STATUS      Continent Diet  AMBULATORY STATUS COMMUNICATION OF NEEDS Skin   Extensive Assist Verbally Normal, Other (Comment)(rashes on back, uses ointment)                       Personal Care Assistance Level of Assistance  Bathing, Feeding, Dressing Bathing Assistance: Limited assistance Feeding assistance: Independent Dressing Assistance: Limited assistance     Functional Limitations Info  Sight, Hearing, Speech Sight Info: Adequate Hearing Info: Adequate Speech Info: Adequate    SPECIAL CARE FACTORS FREQUENCY  OT (By licensed OT), PT (By licensed PT)     PT Frequency: 5x a week OT Frequency: 5x a week            Contractures Contractures Info: Not present    Additional Factors Info  Code Status, Allergies Code Status Info: FULL Allergies Info: Latex           Current Medications (04/24/2018):  This is the current hospital active medication list Current Facility-Administered Medications  Medication Dose Route Frequency Provider Last Rate Last Dose  . acetaminophen (TYLENOL) tablet 650 mg  650 mg Oral Q6H PRN Patrecia Pour, NP   650 mg at 04/21/18 1741  . atorvastatin (LIPITOR) tablet 80 mg  80 mg Oral q1800 Patrecia Pour, NP   80 mg at 04/23/18 1758  . fluticasone (FLONASE) 50 MCG/ACT nasal spray 2 spray  2 spray Each Nare Daily Patrecia Pour, NP   2 spray at 04/23/18 [phone]  . glimepiride (AMARYL) tablet 4 mg  4 mg Oral Q breakfast Patrecia Pour, NP  4 mg at 04/24/18 6378  . metFORMIN (GLUCOPHAGE) tablet 500 mg  500 mg Oral BID Patrecia Pour, NP   500 mg at 04/24/18 0815  . OLANZapine zydis (ZYPREXA) disintegrating tablet 2.5 mg  2.5 mg Oral TID PRN Solon Palm R, DO   2.5 mg at 04/24/18 0203  . OLANZapine zydis (ZYPREXA) disintegrating tablet 2.5 mg  2.5 mg Oral BID Lavella Hammock, MD   Stopped at 04/23/18 2349  . pantoprazole (PROTONIX) EC tablet 40 mg  40 mg Oral Daily Patrecia Pour, NP   40 mg at 04/24/18 0815  .  PARoxetine (PAXIL) tablet 40 mg  40 mg Oral Daily Patrecia Pour, NP   40 mg at 04/23/18 0949  . polyethylene glycol (MIRALAX / GLYCOLAX) packet 17 g  17 g Oral BID Nena Polio, MD   17 g at 04/24/18 0818  . primidone (MYSOLINE) tablet 50 mg  50 mg Oral BID Patrecia Pour, NP   50 mg at 04/24/18 5885  . tiotropium (SPIRIVA) inhalation capsule ([facility] use ONLY) 18 mcg  18 mcg Inhalation Daily Patrecia Pour, NP   18 mcg at 04/24/18 0820   Current Outpatient Medications  Medication Sig Dispense Refill  . traZODone (DESYREL) 50 MG tablet Take 50-100 mg by mouth Nightly.    Marland Kitchen acetaminophen (TYLENOL) 325 MG tablet Take 2 tablets (650 mg total) by mouth every 6 (six) hours as needed for mild pain (or Fever >/= 101).    Marland Kitchen atorvastatin (LIPITOR) 80 MG tablet TAKE 1 TABLET ONE TIME DAILY 90 tablet 3  . blood glucose meter kit and supplies KIT Dispense based on patient and insurance preference. Use up to four times daily as directed. 1 each 0  . fluticasone (FLONASE) 50 MCG/ACT nasal spray Place 2 sprays into both nostrils daily. 16 g 6  . glimepiride (AMARYL) 4 MG tablet take 1 tablet by mouth WITH BREAKFAST. 90 tablet 1  . lisinopril (PRINIVIL,ZESTRIL) 20 MG tablet Take 1 tablet (20 mg total) by mouth daily. 90 tablet 1  . meclizine (ANTIVERT) 12.5 MG tablet Take 1 tablet (12.5 mg total) by mouth 3 (three) times daily as needed for dizziness. 30 tablet 2  . metFORMIN (GLUCOPHAGE) 500 MG tablet TAKE 1 TABLET BY MOUTH TWICE A DAY 180 tablet 2  . pantoprazole (PROTONIX) 40 MG tablet Take 1 tablet (40 mg total) by mouth daily. 90 tablet 1  . PARoxetine (PAXIL) 40 MG tablet Take 1 tablet (40 mg total) by mouth daily. 90 tablet 1  . primidone (MYSOLINE) 50 MG tablet Take 1.5 tablets (75 mg total) by mouth 2 (two) times daily. (Patient taking differently: Take 50 mg by mouth 2 (two) times daily. ) 270 tablet 1  . tiotropium (SPIRIVA) 18 MCG inhalation capsule Place 18 mcg as needed into inhaler and inhale.         Discharge Medications: Please see discharge summary for a list of discharge medications.  Relevant Imaging Results:  Relevant Lab Results:   Additional Information SSN : [id number]  Tania Tarren Velardi, LCSW

## 2018-04-24 NOTE — Progress Notes (Signed)
1:16pm - CSW was notified by [name] from [facility] that bed is now available for pt.  CSW contacted ED secretary to prepare packet, and set up transportation.  CSW contacted pt's daughter [name] to notify her of pt's discharge to [facility].   Bed Number :17  Call to report : [phone]  [facility] CSW signing off.    [facility] [place] [facility]  [phone]

## 2018-04-24 NOTE — ED Notes (Signed)
Patient set up with breakfast tray 

## 2018-04-24 NOTE — ED Notes (Signed)
Pt resting, repositioned, provided with something to eat and is watching tv at this time.

## 2018-04-24 NOTE — Telephone Encounter (Signed)
Thanks

## 2018-04-24 NOTE — Telephone Encounter (Signed)
Call to patient to discuss results of monitor and discuss POC. No answer, LMTCB.  Order placed for EP referral.

## 2018-04-24 NOTE — Telephone Encounter (Signed)
-----   Message from Rise Mu, [place] sent at 04/23/2018  4:26 PM EDT ----- Initial cardiac monitor demonstrated the patient was monitored for 1 day and 4 hours prior to falling off.  This monitor showed A. fib with an average heart rate of 55 bpm.  4 beats of wide-complex tachycardia were noted which could have been nonsustained VT versus A. fib with aberrancy.  Intermittent bradycardia was noted with the lowest heart rate of 37 bpm at 4:30 AM.  Intermittent pauses were noted less than 2 seconds.  1) await second applied monitor 2) please refer to EP for further evaluation given the patient's significant bradycardia and confusion to evaluate for possible symptomatic bradycardia

## 2018-04-24 NOTE — ED Notes (Signed)
Patient asking about going home. Explained to patient he will be going to rehab from here sometime today

## 2018-04-24 NOTE — Progress Notes (Signed)
11:00am - CSW returned call from pt's daughter [name]. [name] wanted to know if there were any updates regarding pt going to [facility]. CSW informed daughter that [facility] is still awaiting authorization from [employer], and will provide her with any updates. [name] thanked [place].   [place], [facility]  [phone]

## 2018-04-24 NOTE — ED Notes (Addendum)
Called [facility]  and notified them that Mr Coate was on his way via [facility]. PoA Anirudh Baiz [phone] contacted and notified that he was on his way. She stated that she was also on her way to [facility] to sign the paperwork. Mr Kirtz departs via [facility].

## 2018-04-24 NOTE — Telephone Encounter (Signed)
Call returned from daughter, [name]. She reports that he is currently in the hospital (ED x 4 days) for delirium vs dementia. He will likely be d/c to [facility] today. Has replacement zio patch in place but has not had transmitter since being in ED. Daughter will work on Financial planner to patient so we can receive data.  Ref to Dr. Caryl Comes made at request of Christell Faith, PA for bradycardia (possibly contributing to sx).   Daughter will touch base with [place] when she finds out where he will be doing post d/c and we can make appt with Dr. Caryl Comes.

## 2018-04-26 DIAGNOSIS — I10 Essential (primary) hypertension: Secondary | ICD-10-CM | POA: Diagnosis not present

## 2018-04-26 DIAGNOSIS — E119 Type 2 diabetes mellitus without complications: Secondary | ICD-10-CM | POA: Diagnosis not present

## 2018-04-26 DIAGNOSIS — R69 Illness, unspecified: Secondary | ICD-10-CM | POA: Diagnosis not present

## 2018-04-26 DIAGNOSIS — J449 Chronic obstructive pulmonary disease, unspecified: Secondary | ICD-10-CM | POA: Diagnosis not present

## 2018-04-26 DIAGNOSIS — E785 Hyperlipidemia, unspecified: Secondary | ICD-10-CM | POA: Diagnosis not present

## 2018-04-26 DIAGNOSIS — G250 Essential tremor: Secondary | ICD-10-CM | POA: Diagnosis not present

## 2018-04-26 DIAGNOSIS — R4182 Altered mental status, unspecified: Secondary | ICD-10-CM | POA: Diagnosis not present

## 2018-04-30 DIAGNOSIS — I4891 Unspecified atrial fibrillation: Secondary | ICD-10-CM | POA: Diagnosis not present

## 2018-04-30 DIAGNOSIS — J449 Chronic obstructive pulmonary disease, unspecified: Secondary | ICD-10-CM | POA: Diagnosis not present

## 2018-04-30 DIAGNOSIS — G4700 Insomnia, unspecified: Secondary | ICD-10-CM | POA: Diagnosis not present

## 2018-04-30 DIAGNOSIS — R69 Illness, unspecified: Secondary | ICD-10-CM | POA: Diagnosis not present

## 2018-04-30 DIAGNOSIS — E1165 Type 2 diabetes mellitus with hyperglycemia: Secondary | ICD-10-CM | POA: Diagnosis not present

## 2018-05-01 ENCOUNTER — Telehealth: Payer: Self-pay

## 2018-05-01 NOTE — Telephone Encounter (Signed)
Copied from [place] [id number]. Topic: Appointment Scheduling - Scheduling Inquiry for Clinic >> May 01, 2018 11:28 AM Virl Axe D wrote: Reason for CRM: Shawn with [facility] [place] called to schedule follow up appointment with Dr. Caryl Bis. No answer on scheduling line x3. Please advise. Cb#[phone] ext [phone]

## 2018-05-07 ENCOUNTER — Ambulatory Visit: Payer: Medicare HMO | Admitting: Family Medicine

## 2018-05-07 ENCOUNTER — Other Ambulatory Visit: Payer: Self-pay

## 2018-05-07 ENCOUNTER — Telehealth: Payer: Self-pay | Admitting: Family Medicine

## 2018-05-07 NOTE — Telephone Encounter (Signed)
Sent to PCP ?

## 2018-05-07 NOTE — Telephone Encounter (Signed)
PT's daughter called and has to cancel appt for today.She was never told it was a vitual appt. Pt's daughter would like to know if Dr. Caryl Bis could call in a prescription for [place] reader. Prick less monitor, tremors are very bad.  Pt's daughter [name] said they will keep the 06/04/2018 88m follow up with Dr. Caryl Bis.

## 2018-05-07 NOTE — Telephone Encounter (Signed)
Copied from [place] [id number]. Topic: Quick Communication - Home Health Verbal Orders >> May 07, 2018  4:08 PM Sharene Skeans wrote: Caller/Agency: Missy / [facility] Callback Number: [phone]  Pt declined home care services. Daughter stated they didn't want or need services/ please advise

## 2018-05-07 NOTE — Telephone Encounter (Signed)
The patient would have to be using insulin 3-4 times daily and checking his sugars 3-4 times daily to be approved for freestyle libre by his insurance.  He is not on any insulin and thus would not qualify for this.

## 2018-05-08 ENCOUNTER — Telehealth: Payer: Self-pay | Admitting: Urology

## 2018-05-08 MED ORDER — SULFAMETHOXAZOLE-TRIMETHOPRIM 800-160 MG PO TABS: 1.0000 | ORAL_TABLET | Freq: Two times a day (BID) | ORAL | 0 refills | Status: DC

## 2018-05-08 NOTE — Telephone Encounter (Signed)
We can send in Septra DS, BID x 5 days.  If he does not improve, he needs to come in for an appointment.  He was also to have a cystoscopy that his daughter cancelled in March for surveillance for his history of bladder cancer.  We need to get that on the schedule at some point as well.

## 2018-05-08 NOTE — Telephone Encounter (Signed)
Called and spoke with pt's daughter [name] she stated that she didn't think that insurance would cover it they are willing to pay out of pocket. These reason why she would like this for her father is because his trimmers are so back that they can't stick him to get a reading of his BS like they would like.  Sent to PCP

## 2018-05-08 NOTE — Telephone Encounter (Signed)
Pt's daughter Chase Simmons called and states that he is having burning with urination and it also has a strong odor. Please advise.

## 2018-05-08 NOTE — Telephone Encounter (Signed)
Spoke with Chase Simmons and notified her of message and cysto apt was made.

## 2018-05-08 NOTE — Telephone Encounter (Signed)
Drop off UA or abx?

## 2018-05-08 NOTE — Telephone Encounter (Signed)
I will send this message to Catie to see if she knows how much free style Elenor Legato would cost out of pocket and how we would go about ordering it for him.

## 2018-05-08 NOTE — Telephone Encounter (Signed)
Please relay the information from Catie's message to the patients daughter. If this is affordable for them I can send it to [facility].

## 2018-05-08 NOTE — Telephone Encounter (Signed)
Cash price at [employer] is ~$65 for each 14 day sensor (so ~$130 per month), but a one time cost for the reader of $84.  However, if the patient/caregivers have a smartphone, they can download the [employer] app and use that instead of the reader for FREE.

## 2018-05-08 NOTE — Telephone Encounter (Signed)
Noted  

## 2018-05-08 NOTE — Addendum Note (Signed)
Addended by: Tommy Rainwater on: 05/08/2018 11:07 AM   Modules accepted: Orders

## 2018-05-08 NOTE — Telephone Encounter (Signed)
Sent to PCP as an FYI  

## 2018-05-09 NOTE — Telephone Encounter (Signed)
Called and spoke with pt's daughter. Daughter advised and will look into the app first if she can't figure it out she will call us back.

## 2018-05-10 DIAGNOSIS — G479 Sleep disorder, unspecified: Secondary | ICD-10-CM | POA: Diagnosis not present

## 2018-05-10 DIAGNOSIS — R413 Other amnesia: Secondary | ICD-10-CM | POA: Diagnosis not present

## 2018-05-10 DIAGNOSIS — R69 Illness, unspecified: Secondary | ICD-10-CM | POA: Diagnosis not present

## 2018-05-10 DIAGNOSIS — R410 Disorientation, unspecified: Secondary | ICD-10-CM | POA: Diagnosis not present

## 2018-05-16 ENCOUNTER — Ambulatory Visit: Payer: Self-pay

## 2018-05-16 NOTE — Telephone Encounter (Signed)
Pt's daughter called to report poss. Exposure to COVID 19, when pt. Was in a skilled care facility.  Stated the pt. Was discharged 13 days ago, and family has been informed of one contracted worker and 2 regular staff members testing positive for COVID 72.  Stated since the pt. has been home, he has not exhibited any symptoms of cough, shortness of breath, or fever.  Stated that the pt. has dementia.  Reported that due to his overall health, she questioned if he should be tested for COVID 19?  Advised will send this note to PCP for further recommendations.  Agreed with plan.  Reason for Disposition . 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 CLOSE CONTACT: "Who is the person with the confirmed or suspected COVID-19 infection that you were exposed to?"     A contracted worker at a [facility] 2. PLACE of CONTACT: "Where were you when you were exposed to COVID-19?" (e.g., home, school, medical waiting room; which city?)    [place] 3. TYPE of CONTACT: "How much contact was there?" (e.g., sitting next to, live in same house, work in same office, same building)     Unsure; one individual was in building; 2 additional staff members in building have tested positive 4. DURATION of CONTACT: "How long were you in contact with the COVID-19 patient?" (e.g., a few seconds, passed by person, a few minutes, live with the patient)     Unknown  5. DATE of CONTACT: "When did you have contact with a COVID-19 patient?" (e.g., how many days ago)     4/29-4/30  6. TRAVEL: "Have you traveled out of the country recently?" If so, "When and where?"     * Also ask about out-of-state travel, since the CDC  has identified some high risk cities for community spread in the [place].     * Note: Travel becomes less relevant if there is widespread community transmission where the patient lives.     No  7. COMMUNITY SPREAD: "Are there lots of cases of COVID-19 (community spread) where you live?" (See public health department website, if unsure)   * MAJOR community spread: high number of cases; numbers of cases are increasing; many people hospitalized.   * MINOR community spread: low number of cases; not increasing; few or no people hospitalized     Present in community  8. SYMPTOMS: "Do you have any symptoms?" (e.g., fever, cough, breathing difficulty)     No c/o sore throat, fever, cough, shortness of breath 9. PREGNANCY OR POSTPARTUM: "Is there any chance you are pregnant?" "When was your last menstrual period?" "Did you deliver in the last 2 weeks?"     N/ a 10. HIGH RISK: "Do you have any heart or lung problems? Do you have a weak immune system?" (e.g., CHF, COPD, asthma, HIV positive, chemotherapy, renal failure, diabetes mellitus, sickle cell anemia)       DM, COPD, CAD, HTN  Protocols used: CORONAVIRUS (COVID-19) EXPOSURE-A-AH

## 2018-05-22 DIAGNOSIS — R413 Other amnesia: Secondary | ICD-10-CM | POA: Diagnosis not present

## 2018-05-22 DIAGNOSIS — R69 Illness, unspecified: Secondary | ICD-10-CM | POA: Diagnosis not present

## 2018-05-22 DIAGNOSIS — G479 Sleep disorder, unspecified: Secondary | ICD-10-CM | POA: Diagnosis not present

## 2018-05-22 DIAGNOSIS — R410 Disorientation, unspecified: Secondary | ICD-10-CM | POA: Diagnosis not present

## 2018-05-23 ENCOUNTER — Other Ambulatory Visit: Payer: Self-pay | Admitting: Family Medicine

## 2018-05-24 ENCOUNTER — Other Ambulatory Visit: Payer: Self-pay

## 2018-05-24 ENCOUNTER — Telehealth (INDEPENDENT_AMBULATORY_CARE_PROVIDER_SITE_OTHER): Payer: Medicare HMO | Admitting: Internal Medicine

## 2018-05-24 DIAGNOSIS — I4819 Other persistent atrial fibrillation: Secondary | ICD-10-CM | POA: Diagnosis not present

## 2018-05-24 DIAGNOSIS — R001 Bradycardia, unspecified: Secondary | ICD-10-CM

## 2018-05-24 DIAGNOSIS — F0390 Unspecified dementia without behavioral disturbance: Secondary | ICD-10-CM

## 2018-05-24 DIAGNOSIS — R410 Disorientation, unspecified: Secondary | ICD-10-CM

## 2018-05-24 DIAGNOSIS — I428 Other cardiomyopathies: Secondary | ICD-10-CM | POA: Diagnosis not present

## 2018-05-24 NOTE — Patient Instructions (Signed)
Medication Instructions:  - Your physician recommends that you continue on your current medications as directed. Please refer to the Current Medication list given to you today.  If you need a refill on your cardiac medications before your next appointment, please call your pharmacy.   Lab work: - none ordered  If you have labs (blood work) drawn today and your tests are completely normal, you will receive your results only by: . MyChart Message (if you have MyChart) OR . A paper copy in the mail If you have any lab test that is abnormal or we need to change your treatment, we will call you to review the results.  Testing/Procedures: - none ordered  Follow-Up: At [facility], you and your health needs are our priority.  As part of our continuing mission to provide you with exceptional heart care, we have created designated Provider Care Teams.  These Care Teams include your primary Cardiologist (physician) and Advanced Practice Providers (APPs -  Physician Assistants and Nurse Practitioners) who all work together to provide you with the care you need, when you need it. . as needed with Dr. Klein.  Any Other Special Instructions Will Be Listed Below (If Applicable). - N/A   

## 2018-05-24 NOTE — Progress Notes (Signed)
Electrophysiology TeleHealth Note   Due to national recommendations of social distancing due to COVID 19, an audio/video telehealth visit is felt to be most appropriate for this patient at this time.  See MyChart message from today for the patient's consent to telehealth for [facility].   Date:  05/29/2018   ID:  Chase Simmons, DOB [birth year], MRN [id number]  Location: patient's home  Provider location: [address], [place]  Evaluation Performed: Initial Evaluation  PCP:  Leone Haven, MD  Cardiologist:  Kathlyn Sacramento, MD   Chief Complaint:  *slow HR  History of Present Illness:    Chase Simmons is a 85 y.o. male who presents via audio/video conferencing for a telehealth visit today for  Discussing the possible role of a pacemaker.  The conversation occurred solely with his daughter.  He is chair bound, suffers from dementia and hence has no complaints  The consult was set up because of bradycardia noted in the office, ECG  07/13/16 >>   2/20 HRs in high 30s, notably in sinus  Event recorder >> HR 50-80's but pt in afib, also daytime    DATE TEST EF   7/18 LHC  25 % Cors Non Obst  1/19 Echo   45-50 %              The patient denies symptoms of fevers, chills, cough, or new SOB worrisome for COVID 19.    Past Medical History:  Diagnosis Date   Allergy    Bladder cancer ([place])    a. followed by Dr. Jacqlyn Larsen   Chronic combined systolic (congestive) and diastolic (congestive) heart failure ([place])    a. 07/2016 Echo: EF 30-35%, sev mid-apicalanteroseptal, ant, inf HK, Gr1 DD.   COPD (chronic obstructive pulmonary disease) ([facility])    Hyperlipidemia    Hypertension    NICM (nonischemic cardiomyopathy) ([place])    a. 07/2016 Echo: EF 30-35%, sev mid-apicalanteroseptal, ant, inf HK, Gr1 DD, mild MR, mildly dil LA, PASP 52mHg - ? stress induced CM.   Nonobstructive Coronary atherosclerosis    a. 07/2016 Cath: LM nl, LAD nl, D1 40ost, LCX 240mRCA  nl, EF 25-30%.   PAF (paroxysmal atrial fibrillation) ([facility]   a. Dx 08/2013. CHA2DS2VASc = 6-->no OAC 2/2 h/o falls and nocompliance.   Tremor, essential    sees [facility] neurologist   Type 2 diabetes mellitus with complication, without long-term current use of insulin ([facility]/30/2014   Urinary incontinence     Past Surgical History:  Procedure Laterality Date   bladder cancer     CATARACT EXTRACTION     HERNIA REPAIR     LEFT HEART CATH AND CORONARY ANGIOGRAPHY N/A 07/11/2016   Procedure: Left Heart Cath and Coronary Angiography;  Surgeon: ArWellington HampshireMD;  [facility];  Service: Cardiovascular;  Laterality: N/A;    Current Outpatient Medications  Medication Sig Dispense Refill   acetaminophen (TYLENOL) 325 MG tablet Take 2 tablets (650 mg total) by mouth every 6 (six) hours as needed for mild pain (or Fever >/= 101).     atorvastatin (LIPITOR) 80 MG tablet TAKE 1 TABLET ONE TIME DAILY 90 tablet 3   blood glucose meter kit and supplies KIT Dispense based on patient and insurance preference. Use up to four times daily as directed. 1 each 0   fluticasone (FLONASE) 50 MCG/ACT nasal spray Place 2 sprays into both nostrils daily. 16 g 6   gabapentin (  NEURONTIN) 100 MG capsule Take by mouth.     glimepiride (AMARYL) 4 MG tablet take 1 tablet by mouth WITH BREAKFAST. 90 tablet 1   lisinopril (PRINIVIL,ZESTRIL) 20 MG tablet Take 1 tablet (20 mg total) by mouth daily. 90 tablet 1   metFORMIN (GLUCOPHAGE) 500 MG tablet TAKE 1 TABLET BY MOUTH TWICE A DAY 180 tablet 2   pantoprazole (PROTONIX) 40 MG tablet Take 1 tablet (40 mg total) by mouth daily. 90 tablet 1   PARoxetine (PAXIL) 40 MG tablet Take 1 tablet (40 mg total) by mouth daily. 90 tablet 1   primidone (MYSOLINE) 50 MG tablet Take 1.5 tablets (75 mg total) by mouth 2 (two) times daily. (Patient taking differently: Take 25 mg by mouth 2 (two) times daily. ) 270 tablet 1   QUEtiapine (SEROQUEL) 100 MG  tablet Take by mouth.     tiotropium (SPIRIVA) 18 MCG inhalation capsule Place 18 mcg as needed into inhaler and inhale.      No current facility-administered medications for this visit.     Allergies:   Latex   Social History:  The patient  reports that he quit smoking about 15 years ago. He has a 60.00 pack-year smoking history. He has never used smokeless tobacco. He reports current alcohol use of about 12.0 standard drinks of alcohol per week. He reports that he does not use drugs.   Family History:  The patient's * family history includes Arthritis in his mother; Cancer in his maternal aunt and maternal uncle; Dementia in his mother.   ROS:  Please see the history of present illness.   All other systems are personally reviewed and negative.    Exam:    Vital Signs:  There were no vitals taken for this visit.    Labs/Other Tests and Data Reviewed:    Recent Labs: 04/20/2018: ALT 22; BUN 36; Creatinine, Ser 0.86; Hemoglobin 12.3; Platelets 178; Potassium 3.8; Sodium 138; TSH 1.867   Wt Readings from Last 3 Encounters:  02/02/18 226 lb 9.6 oz (102.8 kg)  01/28/18 230 lb (104.3 kg)  10/02/17 232 lb (105.2 kg)     Other studies personally reviewed: Additional studies/ records that were reviewed today include: As above Review of the above records today demonstrates:As above        ASSESSMENT & PLAN:    Atrial fibrillation-persistent  Sinus bradycardia  Confusion/?  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  Currently in atrial fibrillation heart rate excursion is much less limited then suggested by the sinus rates of 38 in the office 2/20; in the event that he persists in atrial fibrillation, his heart rate will be even less of an issue.  However, given the fact that he is wheelchair-bound, and particularly in light of the family's reluctance to consider pacemaker  implantation, there is nothing for [place] to do regarding bradycardia  Importantly trying to keep him off of memory enhancing agents which could aggravate bradycardia  He is now in atrial fibrillation.  There is an ECG from 2017 suggesting atrial fibrillation/flutter.  I discussed with his daughter anticoagulation; she will discuss it with her siblings and they can follow-up with Dr. Caryl Bis.  Both risk of bleeding as well as risk of stroke are elevated and the older.  It may also be that his abrupt decompensation mental status i.e. worsening of his dementia could have been related to a stroke which further enhances stroke  risk.  Also discussed the importance of end-of-life decision-making.  The patient does not currently have a DNR.  I am not sanguine in the event of a cardiac arrest that he would survive. Nor am I sure what he would want      COVID 19 screen The patient denies symptoms of COVID 19 at this time.  The importance of social distancing was discussed today.  Follow-up:  *prn Next remote: NA  Current medicines are reviewed at length with the patient today.   The patient does not have concerns regarding his medicines.  The following changes were made today:  none  Labs/ tests ordered today include:   No orders of the defined types were placed in this encounter.   Future tests ( post COVID )     Patient Risk:  after full review of this patients clinical status, I feel that they are at moderate risk at this time.  Today, I have spent 23* minutes with the patient's daughter with telehealth technology discussing As above .    Signed, Virl Axe, MD  05/29/2018 2:26 PM     [facility] [address] [place] [address] [phone] (office) [phone] (fax)

## 2018-05-30 ENCOUNTER — Telehealth: Payer: Self-pay

## 2018-05-30 NOTE — Telephone Encounter (Signed)
Call attempted to switch patient from a face to face visit with Dr. Fletcher Anon to a telehealth visit on 06/05/2018 due to current clinic policies related to COVID 19 precautions. Left voicemail message requesting for patient to call back to discuss.

## 2018-06-04 ENCOUNTER — Encounter: Payer: Medicare HMO | Admitting: Family Medicine

## 2018-06-04 ENCOUNTER — Other Ambulatory Visit: Payer: Self-pay

## 2018-06-04 ENCOUNTER — Telehealth: Payer: Self-pay

## 2018-06-04 NOTE — Telephone Encounter (Addendum)
[name] (daughter) of Chase Simmons has cancelled the virtual visit with Dr. Fletcher Anon for 06/05/2018 due to the patients health declining due to dementia. [name] doesn't feel her dad is able to do these visits and would rather him be seen in the office. The patient has r/s for July 16th. The patient has a follow up appointment with Dr. Caryl Bis June 3rd and will discuss her dads dementia.

## 2018-06-04 NOTE — Progress Notes (Signed)
Patient was unable to attend the visit.

## 2018-06-05 ENCOUNTER — Other Ambulatory Visit: Payer: Self-pay

## 2018-06-05 ENCOUNTER — Ambulatory Visit: Payer: Medicare HMO | Admitting: Cardiovascular Disease

## 2018-06-06 ENCOUNTER — Emergency Department: Payer: Medicare HMO

## 2018-06-06 ENCOUNTER — Other Ambulatory Visit: Payer: Self-pay

## 2018-06-06 ENCOUNTER — Encounter: Payer: Self-pay | Admitting: Emergency Medicine

## 2018-06-06 ENCOUNTER — Observation Stay
Admission: EM | Admit: 2018-06-06 | Discharge: 2018-06-13 | Disposition: A | Discharge status: Skilled Nursing Facility | Payer: Medicare HMO | Attending: Internal Medicine | Admitting: Internal Medicine

## 2018-06-06 ENCOUNTER — Observation Stay: Payer: Medicare HMO

## 2018-06-06 ENCOUNTER — Ambulatory Visit (INDEPENDENT_AMBULATORY_CARE_PROVIDER_SITE_OTHER): Payer: Medicare HMO | Admitting: Family Medicine

## 2018-06-06 ENCOUNTER — Telehealth: Payer: Self-pay | Admitting: *Deleted

## 2018-06-06 DIAGNOSIS — I428 Other cardiomyopathies: Secondary | ICD-10-CM | POA: Insufficient documentation

## 2018-06-06 DIAGNOSIS — F329 Major depressive disorder, single episode, unspecified: Secondary | ICD-10-CM | POA: Diagnosis not present

## 2018-06-06 DIAGNOSIS — I480 Paroxysmal atrial fibrillation: Secondary | ICD-10-CM | POA: Insufficient documentation

## 2018-06-06 DIAGNOSIS — F0390 Unspecified dementia without behavioral disturbance: Secondary | ICD-10-CM | POA: Insufficient documentation

## 2018-06-06 DIAGNOSIS — Z7984 Long term (current) use of oral hypoglycemic drugs: Secondary | ICD-10-CM | POA: Insufficient documentation

## 2018-06-06 DIAGNOSIS — Z1159 Encounter for screening for other viral diseases: Secondary | ICD-10-CM | POA: Diagnosis not present

## 2018-06-06 DIAGNOSIS — Z87891 Personal history of nicotine dependence: Secondary | ICD-10-CM | POA: Insufficient documentation

## 2018-06-06 DIAGNOSIS — E876 Hypokalemia: Secondary | ICD-10-CM | POA: Insufficient documentation

## 2018-06-06 DIAGNOSIS — R740 Nonspecific elevation of levels of transaminase and lactic acid dehydrogenase [LDH]: Secondary | ICD-10-CM | POA: Diagnosis not present

## 2018-06-06 DIAGNOSIS — R600 Localized edema: Secondary | ICD-10-CM | POA: Diagnosis not present

## 2018-06-06 DIAGNOSIS — I700 Atherosclerosis of aorta: Secondary | ICD-10-CM | POA: Insufficient documentation

## 2018-06-06 DIAGNOSIS — F419 Anxiety disorder, unspecified: Secondary | ICD-10-CM | POA: Diagnosis not present

## 2018-06-06 DIAGNOSIS — Z20828 Contact with and (suspected) exposure to other viral communicable diseases: Secondary | ICD-10-CM | POA: Diagnosis not present

## 2018-06-06 DIAGNOSIS — R410 Disorientation, unspecified: Secondary | ICD-10-CM

## 2018-06-06 DIAGNOSIS — Z993 Dependence on wheelchair: Secondary | ICD-10-CM | POA: Diagnosis not present

## 2018-06-06 DIAGNOSIS — E1165 Type 2 diabetes mellitus with hyperglycemia: Secondary | ICD-10-CM | POA: Diagnosis not present

## 2018-06-06 DIAGNOSIS — R531 Weakness: Secondary | ICD-10-CM | POA: Diagnosis not present

## 2018-06-06 DIAGNOSIS — I2510 Atherosclerotic heart disease of native coronary artery without angina pectoris: Secondary | ICD-10-CM | POA: Insufficient documentation

## 2018-06-06 DIAGNOSIS — Z79899 Other long term (current) drug therapy: Secondary | ICD-10-CM | POA: Insufficient documentation

## 2018-06-06 DIAGNOSIS — R69 Illness, unspecified: Secondary | ICD-10-CM | POA: Diagnosis not present

## 2018-06-06 DIAGNOSIS — I5042 Chronic combined systolic (congestive) and diastolic (congestive) heart failure: Secondary | ICD-10-CM | POA: Diagnosis not present

## 2018-06-06 DIAGNOSIS — R51 Headache: Secondary | ICD-10-CM | POA: Diagnosis not present

## 2018-06-06 DIAGNOSIS — R7989 Other specified abnormal findings of blood chemistry: Secondary | ICD-10-CM

## 2018-06-06 DIAGNOSIS — J449 Chronic obstructive pulmonary disease, unspecified: Secondary | ICD-10-CM | POA: Diagnosis not present

## 2018-06-06 DIAGNOSIS — R0602 Shortness of breath: Secondary | ICD-10-CM | POA: Diagnosis not present

## 2018-06-06 DIAGNOSIS — N400 Enlarged prostate without lower urinary tract symptoms: Secondary | ICD-10-CM | POA: Insufficient documentation

## 2018-06-06 DIAGNOSIS — K219 Gastro-esophageal reflux disease without esophagitis: Secondary | ICD-10-CM | POA: Insufficient documentation

## 2018-06-06 DIAGNOSIS — E119 Type 2 diabetes mellitus without complications: Secondary | ICD-10-CM | POA: Insufficient documentation

## 2018-06-06 DIAGNOSIS — R58 Hemorrhage, not elsewhere classified: Secondary | ICD-10-CM | POA: Diagnosis not present

## 2018-06-06 DIAGNOSIS — W19XXXA Unspecified fall, initial encounter: Secondary | ICD-10-CM

## 2018-06-06 DIAGNOSIS — E872 Acidosis, unspecified: Secondary | ICD-10-CM | POA: Diagnosis present

## 2018-06-06 DIAGNOSIS — I110 Hypertensive heart disease with heart failure: Secondary | ICD-10-CM | POA: Diagnosis not present

## 2018-06-06 DIAGNOSIS — Z8551 Personal history of malignant neoplasm of bladder: Secondary | ICD-10-CM | POA: Diagnosis not present

## 2018-06-06 DIAGNOSIS — I509 Heart failure, unspecified: Secondary | ICD-10-CM | POA: Diagnosis not present

## 2018-06-06 DIAGNOSIS — E785 Hyperlipidemia, unspecified: Secondary | ICD-10-CM | POA: Diagnosis not present

## 2018-06-06 DIAGNOSIS — S51012A Laceration without foreign body of left elbow, initial encounter: Secondary | ICD-10-CM | POA: Diagnosis not present

## 2018-06-06 DIAGNOSIS — Z66 Do not resuscitate: Secondary | ICD-10-CM | POA: Insufficient documentation

## 2018-06-06 LAB — COMPREHENSIVE METABOLIC PANEL
ALT: 20 U/L (ref 0–44)
AST: 18 U/L (ref 15–41)
Albumin: 3.5 g/dL (ref 3.5–5.0)
Alkaline Phosphatase: 76 U/L (ref 38–126)
Anion gap: 7 (ref 5–15)
BUN: 32 mg/dL — ABNORMAL HIGH (ref 8–23)
CO2: 28 mmol/L (ref 22–32)
Calcium: 9.2 mg/dL (ref 8.9–10.3)
Chloride: 107 mmol/L (ref 98–111)
Creatinine, Ser: 0.93 mg/dL (ref 0.61–1.24)
GFR calc Af Amer: >60 mL/min (ref >60)
GFR calc non Af Amer: >60 mL/min (ref >60)
Glucose, Bld: 227 mg/dL — ABNORMAL HIGH (ref 70–99)
Potassium: 3.5 mmol/L (ref 3.5–5.1)
Sodium: 142 mmol/L (ref 135–145)
Total Bilirubin: 0.6 mg/dL (ref 0.3–1.2)
Total Protein: 6.6 g/dL (ref 6.5–8.1)

## 2018-06-06 LAB — TROPONIN I: Troponin I: <0.03 ng/mL (ref <0.03)

## 2018-06-06 LAB — CBC WITH DIFFERENTIAL/PLATELET
Abs Immature Granulocytes: 0.04 10*3/uL (ref 0.00–0.07)
Basophils Absolute: 0 10*3/uL (ref 0.0–0.1)
Basophils Relative: 0 %
Eosinophils Absolute: 0.1 10*3/uL (ref 0.0–0.5)
Eosinophils Relative: 1 %
HCT: 34.4 % — ABNORMAL LOW (ref 39.0–52.0)
Hemoglobin: 11.4 g/dL — ABNORMAL LOW (ref 13.0–17.0)
Immature Granulocytes: 0 %
Lymphocytes Relative: 20 %
Lymphs Abs: 1.9 10*3/uL (ref 0.7–4.0)
MCH: 34.9 pg — ABNORMAL HIGH (ref 26.0–34.0)
MCHC: 33.1 g/dL (ref 30.0–36.0)
MCV: 105.2 fL — ABNORMAL HIGH (ref 80.0–100.0)
Monocytes Absolute: 0.8 10*3/uL (ref 0.1–1.0)
Monocytes Relative: 9 %
Neutro Abs: 6.4 10*3/uL (ref 1.7–7.7)
Neutrophils Relative %: 70 %
Platelets: 185 10*3/uL (ref 150–400)
RBC: 3.27 MIL/uL — ABNORMAL LOW (ref 4.22–5.81)
RDW: 12.4 % (ref 11.5–15.5)
WBC: 9.3 10*3/uL (ref 4.0–10.5)
nRBC: 0 % (ref 0.0–0.2)

## 2018-06-06 LAB — LACTIC ACID, PLASMA: Lactic Acid, Venous: 2.4 mmol/L (ref 0.5–1.9)

## 2018-06-06 LAB — GLUCOSE, CAPILLARY: Glucose-Capillary: 118 mg/dL — ABNORMAL HIGH (ref 70–99)

## 2018-06-06 LAB — URINALYSIS, COMPLETE (UACMP) WITH MICROSCOPIC
Bacteria, UA: NONE SEEN
Bilirubin Urine: NEGATIVE
Glucose, UA: 150 mg/dL — AB
Hgb urine dipstick: NEGATIVE
Ketones, ur: NEGATIVE mg/dL
Leukocytes,Ua: NEGATIVE
Nitrite: NEGATIVE
Protein, ur: 30 mg/dL — AB
Specific Gravity, Urine: 1.02 (ref 1.005–1.030)
pH: 5 (ref 5.0–8.0)

## 2018-06-06 LAB — SARS CORONAVIRUS 2 BY RT PCR (HOSPITAL ORDER, PERFORMED IN ~~LOC~~ HOSPITAL LAB): SARS Coronavirus 2: NEGATIVE

## 2018-06-06 LAB — BRAIN NATRIURETIC PEPTIDE: B Natriuretic Peptide: 285 pg/mL — ABNORMAL HIGH (ref 0.0–100.0)

## 2018-06-06 MED ORDER — ONDANSETRON HCL 4 MG/2ML IJ SOLN: 4.0000 mg | Freq: Four times a day (QID) | INTRAMUSCULAR | Status: DC | PRN

## 2018-06-06 MED ORDER — SODIUM CHLORIDE 0.9 % IV BOLUS
500.0000 mL | Freq: Once | INTRAVENOUS | Status: AC
  Administered 2018-06-06: 500 mL via INTRAVENOUS

## 2018-06-06 MED ORDER — VANCOMYCIN HCL IN DEXTROSE 1-5 GM/200ML-% IV SOLN
1000.0000 mg | Freq: Once | INTRAVENOUS | Status: AC
  Administered 2018-06-06: 1000 mg via INTRAVENOUS
  Filled 2018-06-06 (×2): qty 200

## 2018-06-06 MED ORDER — PAROXETINE HCL 20 MG PO TABS
40.0000 mg | ORAL_TABLET | Freq: Every day | ORAL | Status: DC
  Administered 2018-06-07 – 2018-06-13 (×7): 40 mg via ORAL
  Filled 2018-06-06 (×7): qty 2

## 2018-06-06 MED ORDER — LORAZEPAM 2 MG/ML IJ SOLN
1.0000 mg | INTRAMUSCULAR | Status: DC | PRN
  Administered 2018-06-06 – 2018-06-09 (×6): 1 mg via INTRAVENOUS
  Filled 2018-06-06 (×6): qty 1

## 2018-06-06 MED ORDER — ALBUTEROL SULFATE (2.5 MG/3ML) 0.083% IN NEBU: 2.5000 mg | INHALATION_SOLUTION | RESPIRATORY_TRACT | Status: DC | PRN

## 2018-06-06 MED ORDER — GLIMEPIRIDE 1 MG PO TABS
1.0000 mg | ORAL_TABLET | Freq: Every day | ORAL | Status: DC
  Administered 2018-06-07 – 2018-06-13 (×7): 1 mg via ORAL
  Filled 2018-06-06 (×7): qty 1

## 2018-06-06 MED ORDER — SODIUM CHLORIDE 0.9 % IV SOLN: 2.0000 g | Freq: Three times a day (TID) | INTRAVENOUS | Status: DC

## 2018-06-06 MED ORDER — GABAPENTIN 100 MG PO CAPS
100.0000 mg | ORAL_CAPSULE | Freq: Three times a day (TID) | ORAL | Status: DC
  Administered 2018-06-06 – 2018-06-13 (×20): 100 mg via ORAL
  Filled 2018-06-06 (×20): qty 1

## 2018-06-06 MED ORDER — IOHEXOL 350 MG/ML SOLN
75.0000 mL | Freq: Once | INTRAVENOUS | Status: AC | PRN
  Administered 2018-06-06: 75 mL via INTRAVENOUS
  Filled 2018-06-06: qty 75

## 2018-06-06 MED ORDER — LORAZEPAM 2 MG/ML IJ SOLN
1.0000 mg | Freq: Once | INTRAMUSCULAR | Status: AC
  Administered 2018-06-06: 1 mg via INTRAVENOUS

## 2018-06-06 MED ORDER — LORAZEPAM 2 MG/ML IJ SOLN
INTRAMUSCULAR | Status: AC
  Filled 2018-06-06: qty 1

## 2018-06-06 MED ORDER — ENOXAPARIN SODIUM 40 MG/0.4ML ~~LOC~~ SOLN
40.0000 mg | SUBCUTANEOUS | Status: DC
  Administered 2018-06-06 – 2018-06-12 (×7): 40 mg via SUBCUTANEOUS
  Filled 2018-06-06 (×7): qty 0.4

## 2018-06-06 MED ORDER — POLYETHYLENE GLYCOL 3350 17 G PO PACK: 17.0000 g | PACK | Freq: Every day | ORAL | Status: DC | PRN

## 2018-06-06 MED ORDER — VANCOMYCIN HCL 10 G IV SOLR: 1750.0000 mg | INTRAVENOUS | Status: DC

## 2018-06-06 MED ORDER — PANTOPRAZOLE SODIUM 40 MG PO TBEC
40.0000 mg | DELAYED_RELEASE_TABLET | Freq: Every day | ORAL | Status: DC
  Administered 2018-06-07 – 2018-06-13 (×7): 40 mg via ORAL
  Filled 2018-06-06 (×7): qty 1

## 2018-06-06 MED ORDER — SODIUM CHLORIDE 0.9 % IV SOLN
1000.0000 mL | INTRAVENOUS | Status: DC
  Administered 2018-06-07: 1000 mL via INTRAVENOUS

## 2018-06-06 MED ORDER — INSULIN ASPART 100 UNIT/ML ~~LOC~~ SOLN
0.0000 [IU] | Freq: Three times a day (TID) | SUBCUTANEOUS | Status: DC
  Administered 2018-06-07: 2 [IU] via SUBCUTANEOUS
  Administered 2018-06-07: 1 [IU] via SUBCUTANEOUS
  Administered 2018-06-08: 2 [IU] via SUBCUTANEOUS
  Administered 2018-06-08 – 2018-06-09 (×2): 1 [IU] via SUBCUTANEOUS
  Administered 2018-06-09: 3 [IU] via SUBCUTANEOUS
  Administered 2018-06-09 – 2018-06-10 (×3): 1 [IU] via SUBCUTANEOUS
  Administered 2018-06-10 – 2018-06-11 (×2): 2 [IU] via SUBCUTANEOUS
  Administered 2018-06-11 (×2): 1 [IU] via SUBCUTANEOUS
  Administered 2018-06-12 (×2): 2 [IU] via SUBCUTANEOUS
  Administered 2018-06-12: 1 [IU] via SUBCUTANEOUS
  Administered 2018-06-13: 2 [IU] via SUBCUTANEOUS
  Administered 2018-06-13: 3 [IU] via SUBCUTANEOUS
  Filled 2018-06-06 (×17): qty 1

## 2018-06-06 MED ORDER — LORAZEPAM 2 MG/ML IJ SOLN
INTRAMUSCULAR | Status: AC
  Administered 2018-06-06: 1 mg via INTRAVENOUS
  Filled 2018-06-06: qty 1

## 2018-06-06 MED ORDER — LISINOPRIL 20 MG PO TABS
20.0000 mg | ORAL_TABLET | Freq: Every day | ORAL | Status: DC
  Administered 2018-06-07 – 2018-06-13 (×7): 20 mg via ORAL
  Filled 2018-06-06 (×7): qty 1

## 2018-06-06 MED ORDER — VANCOMYCIN HCL IN DEXTROSE 1-5 GM/200ML-% IV SOLN
1000.0000 mg | Freq: Once | INTRAVENOUS | Status: DC
  Filled 2018-06-06: qty 200

## 2018-06-06 MED ORDER — METRONIDAZOLE IN NACL 5-0.79 MG/ML-% IV SOLN
500.0000 mg | Freq: Once | INTRAVENOUS | Status: DC
  Filled 2018-06-06 (×2): qty 100

## 2018-06-06 MED ORDER — TIOTROPIUM BROMIDE MONOHYDRATE 18 MCG IN CAPS
18.0000 ug | ORAL_CAPSULE | Freq: Every day | RESPIRATORY_TRACT | Status: DC
  Administered 2018-06-07 – 2018-06-12 (×6): 18 ug via RESPIRATORY_TRACT
  Filled 2018-06-06: qty 5

## 2018-06-06 MED ORDER — LORAZEPAM 2 MG/ML IJ SOLN
1.0000 mg | Freq: Once | INTRAMUSCULAR | Status: AC
  Administered 2018-06-06: 1 mg via INTRAVENOUS
  Filled 2018-06-06: qty 1

## 2018-06-06 MED ORDER — ATORVASTATIN CALCIUM 20 MG PO TABS
20.0000 mg | ORAL_TABLET | Freq: Every day | ORAL | Status: DC
  Administered 2018-06-07 – 2018-06-12 (×6): 20 mg via ORAL
  Filled 2018-06-06 (×6): qty 1

## 2018-06-06 MED ORDER — ACETAMINOPHEN 325 MG PO TABS
650.0000 mg | ORAL_TABLET | Freq: Four times a day (QID) | ORAL | Status: DC | PRN
  Administered 2018-06-06 – 2018-06-11 (×2): 650 mg via ORAL
  Filled 2018-06-06 (×2): qty 2

## 2018-06-06 MED ORDER — ACETAMINOPHEN 650 MG RE SUPP: 650.0000 mg | Freq: Four times a day (QID) | RECTAL | Status: DC | PRN

## 2018-06-06 MED ORDER — SODIUM CHLORIDE 0.9 % IV SOLN
2.0000 g | Freq: Once | INTRAVENOUS | Status: AC
  Administered 2018-06-06: 2 g via INTRAVENOUS
  Filled 2018-06-06 (×2): qty 2

## 2018-06-06 MED ORDER — INSULIN ASPART 100 UNIT/ML ~~LOC~~ SOLN: 0.0000 [IU] | SUBCUTANEOUS | Status: DC

## 2018-06-06 MED ORDER — QUETIAPINE FUMARATE 25 MG PO TABS
100.0000 mg | ORAL_TABLET | Freq: Every day | ORAL | Status: DC
  Administered 2018-06-06 – 2018-06-12 (×7): 100 mg via ORAL
  Filled 2018-06-06 (×7): qty 4

## 2018-06-06 MED ORDER — INSULIN ASPART 100 UNIT/ML ~~LOC~~ SOLN: 0.0000 [IU] | Freq: Every day | SUBCUTANEOUS | Status: DC

## 2018-06-06 MED ORDER — ONDANSETRON HCL 4 MG PO TABS: 4.0000 mg | ORAL_TABLET | Freq: Four times a day (QID) | ORAL | Status: DC | PRN

## 2018-06-06 MED ORDER — SODIUM CHLORIDE 0.9 % IV SOLN
1000.0000 mL | INTRAVENOUS | Status: DC
  Administered 2018-06-06: 1000 mL via INTRAVENOUS

## 2018-06-06 NOTE — ED Notes (Signed)
Patient transported to CT 

## 2018-06-06 NOTE — ED Notes (Signed)
Pt returned to ER room from CT. Pt is currently sleeping. This tech is sitting with pt at bedside. [place]

## 2018-06-06 NOTE — ED Notes (Signed)
Patient remains at 98% oxygen saturation on room air. Nasal cannula remains off patient.

## 2018-06-06 NOTE — ED Triage Notes (Addendum)
Pt ems from MD office for weakness x 1 day and shob x 2 weeks. pts resp rate 48, shallow, clear. Pt with left elbow skin tear, bump forehead s/p fall

## 2018-06-06 NOTE — ED Notes (Signed)
Sitter at bedside. Patient becoming agitated and attempting to get out of bed. Jerking away from staff and mumbling under his breath. Provider made aware and order given for IV ativan.

## 2018-06-06 NOTE — Telephone Encounter (Signed)
Patient daughter is calling back regarding the prescreening.  Call back # [phone]

## 2018-06-06 NOTE — ED Notes (Addendum)
ED TO INPATIENT HANDOFF REPORT  ED Nurse Name and Phone #: Karena Addison [phone]  S Name/Age/Gender Chase Simmons 85 y.o. male Room/Bed: ED31A/ED31A  Code Status   Code Status: DNR  Home/SNF/Other Nursing Home Patient oriented to: self Is this baseline? Yes   Triage Complete: Triage complete  Chief Complaint weakness  Triage Note Pt ems from MD office for weakness x 1 day and shob x 2 weeks. pts resp rate 48, shallow, clear. Pt with left elbow skin tear, bump forehead s/p fall   Allergies Allergies  Allergen Reactions  . Latex Itching    Level of Care/Admitting Diagnosis ED Disposition    ED Disposition Condition [facility] Area: [place] [100120]  Level of Care: Med-Surg [16]  Covid Evaluation: N/A  Diagnosis: Lactic acidosis [237628]  Admitting Physician: Hillary Bow [[id number]]  Attending Physician: Hillary Bow [[id number]  PT Class (Do Not Modify): Observation [104]  PT Acc Code (Do Not Modify): Observation [10022]       B Medical/Surgery History Past Medical History:  Diagnosis Date  . Allergy   . Bladder cancer ([place])    a. followed by Dr. Jacqlyn Larsen  . Chronic combined systolic (congestive) and diastolic (congestive) heart failure ([place])    a. 07/2016 Echo: EF 30-35%, sev mid-apicalanteroseptal, ant, inf HK, Gr1 DD.  Marland Kitchen COPD (chronic obstructive pulmonary disease) ([place])   . Hyperlipidemia   . Hypertension   . NICM (nonischemic cardiomyopathy) ([place])    a. 07/2016 Echo: EF 30-35%, sev mid-apicalanteroseptal, ant, inf HK, Gr1 DD, mild MR, mildly dil LA, PASP 36mmHg - ? stress induced CM.  Marland Kitchen Nonobstructive Coronary atherosclerosis    a. 07/2016 Cath: LM nl, LAD nl, D1 40ost, LCX 70m, RCA nl, EF 25-30%.  Marland Kitchen PAF (paroxysmal atrial fibrillation) ([place])    a. Dx 08/2013. CHA2DS2VASc = 6-->no OAC 2/2 h/o falls and nocompliance.  . Tremor, essential    sees [facility] neurologist  . Type 2 diabetes mellitus with complication, without long-term  current use of insulin ([place]) 08/01/2012  . Urinary incontinence    Past Surgical History:  Procedure Laterality Date  . bladder cancer    . CATARACT EXTRACTION    . HERNIA REPAIR    . LEFT HEART CATH AND CORONARY ANGIOGRAPHY N/A 07/11/2016   Procedure: Left Heart Cath and Coronary Angiography;  Surgeon: Wellington Hampshire, MD;  Location: [facility];  Service: Cardiovascular;  Laterality: N/A;     A IV Location/Drains/Wounds Patient Lines/Drains/Airways Status   Active Line/Drains/Airways    Name:   Placement date:   Placement time:   Site:   Days:   Peripheral IV 06/06/18 Left Forearm   06/06/18    1729    Forearm   less than 1   Peripheral IV 06/06/18 Right Antecubital   06/06/18    1955    Antecubital   less than 1          Intake/Output Last 24 hours  Intake/Output Summary (Last 24 hours) at 06/06/2018 2230 Last data filed at 06/06/2018 2047 Gross per 24 hour  Intake 100 ml  Output 75 ml  Net 25 ml    Labs/Imaging Results for orders placed or performed during the hospital encounter of 06/06/18 (from the past 48 hour(s))  Comprehensive metabolic panel     Status: Abnormal   Collection Time: 06/06/18  3:01 PM  Result Value Ref Range   Sodium 142 135 - 145 mmol/L   Potassium 3.5 3.5 -  5.1 mmol/L   Chloride 107 98 - 111 mmol/L   CO2 28 22 - 32 mmol/L   Glucose, Bld 227 (H) 70 - 99 mg/dL   BUN 32 (H) 8 - 23 mg/dL   Creatinine, Ser 0.93 0.61 - 1.24 mg/dL   Calcium 9.2 8.9 - 10.3 mg/dL   Total Protein 6.6 6.5 - 8.1 g/dL   Albumin 3.5 3.5 - 5.0 g/dL   AST 18 15 - 41 U/L   ALT 20 0 - 44 U/L   Alkaline Phosphatase 76 38 - 126 U/L   Total Bilirubin 0.6 0.3 - 1.2 mg/dL   GFR calc non Af Amer >60 >60 mL/min   GFR calc Af Amer >60 >60 mL/min   Anion gap 7 5 - 15    Comment: Performed at [facility], [address] [place], [place] [address]  Troponin I - Once     Status: None   Collection Time: 06/06/18  3:01 PM  Result Value Ref Range   Troponin I <0.03  <0.03 ng/mL    Comment: Performed at [facility], [address] [place], [place] [address]  Lactic acid, plasma     Status: Abnormal   Collection Time: 06/06/18  3:01 PM  Result Value Ref Range   Lactic Acid, Venous 2.4 (HH) 0.5 - 1.9 mmol/L    Comment: CRITICAL RESULT CALLED TO, READ BACK BY AND VERIFIED WITH LAURA CATES @1546  06/06/18 MJU Performed at [facility], [address] [place], [place] [address]   Urinalysis, Complete w Microscopic     Status: Abnormal   Collection Time: 06/06/18  3:01 PM  Result Value Ref Range   Color, Urine YELLOW (A) YELLOW   APPearance CLEAR (A) CLEAR   Specific Gravity, Urine 1.020 1.005 - 1.030   pH 5.0 5.0 - 8.0   Glucose, UA 150 (A) NEGATIVE mg/dL   Hgb urine dipstick NEGATIVE NEGATIVE   Bilirubin Urine NEGATIVE NEGATIVE   Ketones, ur NEGATIVE NEGATIVE mg/dL   Protein, ur 30 (A) NEGATIVE mg/dL   Nitrite NEGATIVE NEGATIVE   Leukocytes,Ua NEGATIVE NEGATIVE   RBC / HPF 0-5 0 - 5 RBC/hpf   WBC, UA 0-5 0 - 5 WBC/hpf   Bacteria, UA NONE SEEN NONE SEEN   Squamous Epithelial / LPF 0-5 0 - 5   Mucus PRESENT     Comment: Performed at [facility], [address] [place], [place] [address]  SARS Coronavirus 2 (CEPHEID- Performed in [place] hospital lab), Hosp Order     Status: None   Collection Time: 06/06/18  3:24 PM  Result Value Ref Range   SARS Coronavirus 2 NEGATIVE NEGATIVE    Comment: (NOTE) If result is NEGATIVE SARS-CoV-2 target nucleic acids are NOT DETECTED. The SARS-CoV-2 RNA is generally detectable in upper and lower  respiratory specimens during the acute phase of infection. The lowest  concentration of SARS-CoV-2 viral copies this assay can detect is 250  copies / mL. A negative result does not preclude SARS-CoV-2 infection  and should not be used as the sole basis for treatment or other  patient management decisions.  A negative result may occur with  improper specimen collection / handling,  submission of specimen other  than nasopharyngeal swab, presence of viral mutation(s) within the  areas targeted by this assay, and inadequate number of viral copies  (<250 copies / mL). A negative result must be combined with clinical  observations, patient history, and epidemiological information. If result is  POSITIVE SARS-CoV-2 target nucleic acids are DETECTED. The SARS-CoV-2 RNA is generally detectable in upper and lower  respiratory specimens dur ing the acute phase of infection.  Positive  results are indicative of active infection with SARS-CoV-2.  Clinical  correlation with patient history and other diagnostic information is  necessary to determine patient infection status.  Positive results do  not rule out bacterial infection or co-infection with other viruses. If result is PRESUMPTIVE POSTIVE SARS-CoV-2 nucleic acids MAY BE PRESENT.   A presumptive positive result was obtained on the submitted specimen  and confirmed on repeat testing.  While 2019 novel coronavirus  (SARS-CoV-2) nucleic acids may be present in the submitted sample  additional confirmatory testing may be necessary for epidemiological  and / or clinical management purposes  to differentiate between  SARS-CoV-2 and other Sarbecovirus currently known to infect humans.  If clinically indicated additional testing with an alternate test  methodology [phone]) is advised. The SARS-CoV-2 RNA is generally  detectable in upper and lower respiratory sp ecimens during the acute  phase of infection. The expected result is Negative. Fact Sheet for Patients:  StrictlyIdeas.no Fact Sheet for Healthcare Providers: BankingDealers.co.za This test is not yet approved or cleared by the [place] FDA and has been authorized for detection and/or diagnosis of SARS-CoV-2 by FDA under an Emergency Use Authorization (EUA).  This EUA will remain in effect (meaning this test can be  used) for the duration of the COVID-19 declaration under Section 564(b)(1) of the Act, 21 U.S.C. section 360bbb-3(b)(1), unless the authorization is terminated or revoked sooner. Performed at [facility], [address] [place], [place] [address]   Brain natriuretic peptide     Status: Abnormal   Collection Time: 06/06/18  4:00 PM  Result Value Ref Range   B Natriuretic Peptide 285.0 (H) 0.0 - 100.0 pg/mL    Comment: Performed at [facility], [address] [place], [place] [address]  CBC with Differential     Status: Abnormal   Collection Time: 06/06/18  8:00 PM  Result Value Ref Range   WBC 9.3 4.0 - 10.5 K/uL   RBC 3.27 (L) 4.22 - 5.81 MIL/uL   Hemoglobin 11.4 (L) 13.0 - 17.0 g/dL   HCT 34.4 (L) 39.0 - 52.0 %   MCV 105.2 (H) 80.0 - 100.0 fL   MCH 34.9 (H) 26.0 - 34.0 pg   MCHC 33.1 30.0 - 36.0 g/dL   RDW 12.4 11.5 - 15.5 %   Platelets 185 150 - 400 K/uL   nRBC 0.0 0.0 - 0.2 %   Neutrophils Relative % 70 %   Neutro Abs 6.4 1.7 - 7.7 K/uL   Lymphocytes Relative 20 %   Lymphs Abs 1.9 0.7 - 4.0 K/uL   Monocytes Relative 9 %   Monocytes Absolute 0.8 0.1 - 1.0 K/uL   Eosinophils Relative 1 %   Eosinophils Absolute 0.1 0.0 - 0.5 K/uL   Basophils Relative 0 %   Basophils Absolute 0.0 0.0 - 0.1 K/uL   Immature Granulocytes 0 %   Abs Immature Granulocytes 0.04 0.00 - 0.07 K/uL    Comment: Performed at [facility], [address] [place], [place] [address]   Ct Head Wo Contrast  Result Date: 06/06/2018 CLINICAL DATA:  Initial evaluation for acute headache status post trauma. EXAM: CT HEAD WITHOUT CONTRAST TECHNIQUE: Contiguous axial images were obtained from the base of the skull through the vertex without intravenous contrast. COMPARISON:  Prior CT from 04/20/2018 FINDINGS:  Brain: Examination severely degraded by motion artifact. Generalized age-related cerebral atrophy with chronic microvascular ischemic disease. No acute intracranial hemorrhage.  No acute large vessel territory infarct. No mass lesion, midline shift or mass effect. Mild diffuse ventricular prominence related global parenchymal volume loss of hydrocephalus. No definite extra-axial fluid collection on this motion degraded exam. Vascular: No hyperdense vessel. Scattered vascular calcifications noted within the carotid siphons. Skull: No visible scalp soft tissue injury. Calvarium grossly intact. Sinuses/Orbits: Globes and orbital soft tissues within normal limits. Visualized paranasal sinuses and mastoid air cells are clear. Other: None. IMPRESSION: 1. Severely motion degraded exam. No definite acute intracranial process identified. 2. Generalized age-related cerebral atrophy with chronic microvascular ischemic disease. Electronically Signed   By: Jeannine Boga M.D.   On: 06/06/2018 16:58   Ct Angio Chest Pe W Or Wo Contrast  Result Date: 06/06/2018 CLINICAL DATA:  Shortness of breath EXAM: CT ANGIOGRAPHY CHEST WITH CONTRAST TECHNIQUE: Multidetector CT imaging of the chest was performed using the standard protocol during bolus administration of intravenous contrast. Multiplanar CT image reconstructions and MIPs were obtained to evaluate the vascular anatomy. CONTRAST:  79mL OMNIPAQUE IOHEXOL 350 MG/ML SOLN COMPARISON:  CT angiogram chest July 20, 2016 and chest radiograph June 06, 2018 FINDINGS: Cardiovascular: There is no demonstrable pulmonary embolus. There is no thoracic aortic aneurysm or dissection. There are foci of calcification in the visualized great vessels. There is aortic atherosclerosis. There are foci of coronary artery calcification. There is no pericardial effusion or pericardial thickening evident. The main pulmonary outflow tract is prominent, measuring 3.1 cm in diameter. Mediastinum/Nodes: Thyroid appears unremarkable. There are scattered subcentimeter mediastinal lymph nodes. There is a subcarinal lymph node measuring 1.9 x 1.6 cm. There is a lymph node  immediately anterior to the carina measuring 1.3 x 1.3 cm. No esophageal lesions are evident. Lungs/Pleura: No demonstrable edema or consolidation. There is patchy atelectasis as well as mild chronic interstitial thickening. On axial slice 64 series 6, there is a 7 mm nodular opacity in the right lower lobe, essentially stable. No new parenchymal lung nodular lesions evident. No pleural effusion or pleural thickening. Upper Abdomen: There is cholelithiasis. There is aortic atherosclerosis in the upper abdomen. Visualized upper abdominal structures otherwise appear unremarkable. Musculoskeletal: Bones are osteoporotic. There is degenerative change in the thoracic spine. No focal blastic or lytic bone lesions are evident. There are no chest wall lesions. Review of the MIP images confirms the above findings. IMPRESSION: 1. No demonstrable pulmonary embolus. No thoracic aortic aneurysm or dissection. There is aortic atherosclerosis as well as foci of calcification in great vessels and coronary arteries. 2. Prominence of the main pulmonary outflow tract, a finding indicative of a degree of pulmonary arterial hypertension. 3. No edema or consolidation. Areas of atelectatic change in chronic interstitial thickening. 7 mm nodular opacity right lower lobe, essentially stable. No pleural effusion. 4.  Mild adenopathy of uncertain etiology. 5.  Cholelithiasis. Aortic Atherosclerosis (ICD10-I70.0). Electronically Signed   By: Lowella Grip III M.D.   On: 06/06/2018 20:28   US Venous Img Lower Bilateral  Result Date: 06/06/2018 CLINICAL DATA:  Bilateral lower extremity edema EXAM: BILATERAL LOWER EXTREMITY VENOUS DUPLEX ULTRASOUND TECHNIQUE: Gray-scale sonography with graded compression, as well as color Doppler and duplex ultrasound were performed to evaluate the lower extremity deep venous systems from the level of the common femoral vein and including the common femoral, femoral, profunda femoral, popliteal and calf  veins including the posterior tibial, peroneal and gastrocnemius veins when visible.  The superficial great saphenous vein was also interrogated. Spectral Doppler was utilized to evaluate flow at rest and with distal augmentation maneuvers in the common femoral, femoral and popliteal veins. COMPARISON:  June 21, 2015. FINDINGS: RIGHT LOWER EXTREMITY Common Femoral Vein: No evidence of thrombus. Normal compressibility, respiratory phasicity and response to augmentation. Saphenofemoral Junction: No evidence of thrombus. Normal compressibility and flow on color Doppler imaging. Profunda Femoral Vein: No evidence of thrombus. Normal compressibility and flow on color Doppler imaging. Femoral Vein: No evidence of thrombus. Normal compressibility, respiratory phasicity and response to augmentation. Popliteal Vein: No evidence of thrombus. Normal compressibility, respiratory phasicity and response to augmentation. Calf Veins: No evidence of thrombus. Normal compressibility and flow on color Doppler imaging. Superficial Great Saphenous Vein: No evidence of thrombus. Normal compressibility. Venous Reflux:  None. Other Findings:  None. LEFT LOWER EXTREMITY Common Femoral Vein: No evidence of thrombus. Normal compressibility, respiratory phasicity and response to augmentation. Saphenofemoral Junction: No evidence of thrombus. Normal compressibility and flow on color Doppler imaging. Profunda Femoral Vein: No evidence of thrombus. Normal compressibility and flow on color Doppler imaging. Femoral Vein: No evidence of thrombus. Normal compressibility, respiratory phasicity and response to augmentation. Popliteal Vein: No evidence of thrombus. Normal compressibility, respiratory phasicity and response to augmentation. Calf Veins: No evidence of thrombus. Normal compressibility and flow on color Doppler imaging. Superficial Great Saphenous Vein: No evidence of thrombus. Normal compressibility. Venous Reflux:  None. Other Findings:   None. IMPRESSION: No evidence of deep venous thrombosis in either lower extremity. Electronically Signed   By: Lowella Grip III M.D.   On: 06/06/2018 21:39   Dg Chest Portable 1 View  Result Date: 06/06/2018 CLINICAL DATA:  Weakness for 1 day, shortness of breath for 2 weeks, type II diabetes mellitus, atrial fibrillation, hypertension, COPD, bladder cancer, former smoker EXAM: PORTABLE CHEST 1 VIEW COMPARISON:  Portable exam 1528 hours compared to 04/20/2018 FINDINGS: Enlargement of cardiac silhouette. Mediastinal contours and pulmonary vascularity normal. Atherosclerotic calcification aorta. Central peribronchial thickening and minimal chronic accentuation of interstitial markings appear unchanged. Minimal subsegmental atelectasis LEFT base. Remaining lungs clear. No pleural effusion or pneumothorax. IMPRESSION: Enlargement of cardiac silhouette. Bronchitic changes with minimal LEFT basilar atelectasis. Electronically Signed   By: Lavonia Dana M.D.   On: 06/06/2018 15:47    Pending Labs Unresulted Labs (From admission, onward)    Start     Ordered   06/13/18 0500  Creatinine, serum  (enoxaparin (LOVENOX)    CrCl >/= 30 ml/min)  Weekly,   STAT    Comments:  while on enoxaparin therapy    06/06/18 2014   06/07/18 4401  Basic metabolic panel  Tomorrow morning,   STAT     06/06/18 2014   06/07/18 0500  CBC  Tomorrow morning,   STAT     06/06/18 2014   06/06/18 1517  Lactic acid, plasma  Now then every 2 hours,   STAT     06/06/18 1516   06/06/18 1517  Culture, blood (routine x 2)  BLOOD CULTURE X 2,   STAT     06/06/18 1516          Vitals/Pain Today's Vitals   06/06/18 1515 06/06/18 1516 06/06/18 1523 06/06/18 2001  BP:  (!) 226/146  (!) 147/70  Pulse:  67    Temp:  98.4 F (36.9 C)    TempSrc:  Oral    SpO2: 100% 100%    Weight:   90.7 kg   Height:  5\' 9"  (1.753 m)   PainSc:   0-No pain     Isolation Precautions No active isolations  Medications Medications   LORazepam (ATIVAN) injection 1 mg (1 mg Intravenous Given 06/06/18 1930)  0.9 %  sodium chloride infusion (has no administration in time range)  LORazepam (ATIVAN) 2 MG/ML injection (has no administration in time range)  insulin aspart (novoLOG) injection 0-9 Units (has no administration in time range)  insulin aspart (novoLOG) injection 0-5 Units (has no administration in time range)  enoxaparin (LOVENOX) injection 40 mg (has no administration in time range)  acetaminophen (TYLENOL) tablet 650 mg (has no administration in time range)    Or  acetaminophen (TYLENOL) suppository 650 mg (has no administration in time range)  polyethylene glycol (MIRALAX / GLYCOLAX) packet 17 g (has no administration in time range)  ondansetron (ZOFRAN) tablet 4 mg (has no administration in time range)    Or  ondansetron (ZOFRAN) injection 4 mg (has no administration in time range)  albuterol (PROVENTIL) (2.5 MG/3ML) 0.083% nebulizer solution 2.5 mg (has no administration in time range)  QUEtiapine (SEROQUEL) tablet 100 mg (has no administration in time range)  LORazepam (ATIVAN) injection 1 mg (1 mg Intravenous Given 06/06/18 1555)  sodium chloride 0.9 % bolus 500 mL (500 mLs Intravenous New Bag/Given 06/06/18 1730)  ceFEPIme (MAXIPIME) 2 g in sodium chloride 0.9 % 100 mL IVPB (0 g Intravenous Stopped 06/06/18 1810)  vancomycin (VANCOCIN) IVPB 1000 mg/200 mL premix (1,000 mg Intravenous New Bag/Given 06/06/18 1800)  LORazepam (ATIVAN) injection 1 mg (1 mg Intravenous Given 06/06/18 1645)  iohexol (OMNIPAQUE) 350 MG/ML injection 75 mL (75 mLs Intravenous Contrast Given 06/06/18 2000)    Mobility non-ambulatory High fall risk   Focused Assessments    R Recommendations: See Admitting Provider Note  Report given to: Selinda Eon, RN

## 2018-06-06 NOTE — ED Notes (Signed)
Patient back from CT. Patient very restless and agitated. Patient pulled IV out at CT. Verbal order for IV ativan given. Sitter remains with patient. New IV placed and medication given.

## 2018-06-06 NOTE — Telephone Encounter (Signed)
Patients daughter states her brother is taking patient to appt today. His number is  [phone] Patients daughter said they will leave 30 mins before appt to call the brothers number if pre screening is not done.

## 2018-06-06 NOTE — Progress Notes (Signed)
Advance care planning  Purpose of Encounter Hospitalization with lactic acidosis and shortness of breath  Parties in Attendance Patient, daughter over the phone.  Patient is confused with dementia and unable to participate in examination.  Daughter unable to visit due to COVID restrictions.  Discussed in detail regarding shortness of breath and lactic acidosis.  Treatment plan , prognosis discussed.  All questions answered  CODE STATUS discussed.  With multiple comorbidities patient's daughter mentions they would not want any aggressive treatment.  We discussed regarding DNR/DNI which she agrees with.  Orders entered and CODE STATUS changed  DNR/DNI  Time spent - 17 minutes

## 2018-06-06 NOTE — H&P (Signed)
Chase Simmons at [place] NAME: Chase Simmons    MR#:  [id number]  DATE OF BIRTH:  [birth year]  DATE OF ADMISSION:  06/06/2018  PRIMARY CARE PHYSICIAN: Leone Haven, MD   REQUESTING/REFERRING PHYSICIAN: Dr. Archie Balboa  CHIEF COMPLAINT:   Chief Complaint  Patient presents with  . Weakness  . Fall    HISTORY OF PRESENT ILLNESS:  Chase Simmons  is a 85 y.o. male with a known history of dementia, paroxysmal atrial fibrillation, nonischemic cardiomyopathy with ejection fraction of 30%, COPD presents to the emergency room sent in by his primary care physician due to concern for shortness of breath and weakness.  Patient went for a routine appointment to see Dr. Biagio Quint his primary care physician and and he was thought to be short of breath.  Patient is poor historian.  Ambulatory pulse ox could not be done as patient is in a wheelchair.  Saturations at rest were normal.  There while getting into his wheelchair patient sats dropped into the 70s.  Here in the emergency room patient sats have been normal on room air.  He has been found to have lactic acid mildly elevated at 2.4.  Was given IV antibiotics in the emergency room.  COVID test negative.  Chest x-ray clear.  Normal WBC.  Urinalysis normal.  Patient confused and unable to give history.  History obtained from daughter over the phone.  PAST MEDICAL HISTORY:   Past Medical History:  Diagnosis Date  . Allergy   . Bladder cancer ([place])    a. followed by Dr. Jacqlyn Larsen  . Chronic combined systolic (congestive) and diastolic (congestive) heart failure ([place])    a. 07/2016 Echo: EF 30-35%, sev mid-apicalanteroseptal, ant, inf HK, Gr1 DD.  Marland Kitchen COPD (chronic obstructive pulmonary disease) ([place])   . Hyperlipidemia   . Hypertension   . NICM (nonischemic cardiomyopathy) ([place])    a. 07/2016 Echo: EF 30-35%, sev mid-apicalanteroseptal, ant, inf HK, Gr1 DD, mild MR, mildly dil LA, PASP 80mmHg - ? stress induced CM.  Marland Kitchen  Nonobstructive Coronary atherosclerosis    a. 07/2016 Cath: LM nl, LAD nl, D1 40ost, LCX 53m, RCA nl, EF 25-30%.  Marland Kitchen PAF (paroxysmal atrial fibrillation) ([place])    a. Dx 08/2013. CHA2DS2VASc = 6-->no OAC 2/2 h/o falls and nocompliance.  . Tremor, essential    sees [facility] neurologist  . Type 2 diabetes mellitus with complication, without long-term current use of insulin ([place]) 08/01/2012  . Urinary incontinence     PAST SURGICAL HISTORY:   Past Surgical History:  Procedure Laterality Date  . bladder cancer    . CATARACT EXTRACTION    . HERNIA REPAIR    . LEFT HEART CATH AND CORONARY ANGIOGRAPHY N/A 07/11/2016   Procedure: Left Heart Cath and Coronary Angiography;  Surgeon: Wellington Hampshire, MD;  Location: [facility];  Service: Cardiovascular;  Laterality: N/A;    SOCIAL HISTORY:   Social History   Tobacco Use  . Smoking status: Former Smoker    Packs/day: 1.50    Years: 40.00    Pack years: 60.00    Last attempt to quit: 02/03/2003    Years since quitting: 15.3  . Smokeless tobacco: Never Used  Substance Use Topics  . Alcohol use: Yes    Alcohol/week: 12.0 standard drinks    Types: 12 Standard drinks or equivalent per week    Frequency: Never    Comment: 1 drink with dinner    FAMILY HISTORY:  Family History  Problem Relation Age of Onset  . Arthritis Mother   . Dementia Mother   . Cancer Maternal Uncle        prostate  . Cancer Maternal Aunt        breast cancer    DRUG ALLERGIES:   Allergies  Allergen Reactions  . Latex Itching    REVIEW OF SYSTEMS:   Review of Systems  Unable to perform ROS: Dementia    MEDICATIONS AT HOME:   Prior to Admission medications   Medication Sig Start Date End Date Taking? Authorizing Provider  acetaminophen (TYLENOL) 325 MG tablet Take 2 tablets (650 mg total) by mouth every 6 (six) hours as needed for mild pain (or Fever >/= 101). 06/22/15  Yes Gouru, Illene Silver, MD  atorvastatin (LIPITOR) 80 MG tablet TAKE 1 TABLET ONE  TIME DAILY 06/15/17  Yes Leone Haven, MD  gabapentin (NEURONTIN) 100 MG capsule Take 100 mg by mouth 3 (three) times daily.  05/22/18  Yes [provider]  glimepiride (AMARYL) 4 MG tablet take 1 tablet by mouth WITH BREAKFAST. 06/15/17  Yes Leone Haven, MD  lisinopril (PRINIVIL,ZESTRIL) 20 MG tablet Take 1 tablet (20 mg total) by mouth daily. 06/15/17  Yes Leone Haven, MD  metFORMIN (GLUCOPHAGE) 500 MG tablet TAKE 1 TABLET BY MOUTH TWICE A DAY 05/23/18  Yes Leone Haven, MD  pantoprazole (PROTONIX) 40 MG tablet Take 1 tablet (40 mg total) by mouth daily. 06/15/17  Yes Leone Haven, MD  PARoxetine (PAXIL) 40 MG tablet Take 1 tablet (40 mg total) by mouth daily. 01/08/18  Yes Guse, Jacquelynn Cree, FNP  QUEtiapine (SEROQUEL) 100 MG tablet Take 100 mg by mouth at bedtime.  05/22/18  Yes [provider]  tiotropium (SPIRIVA) 18 MCG inhalation capsule Place 18 mcg as needed into inhaler and inhale.    Yes [provider]     VITAL SIGNS:  Blood pressure (!) 147/70, pulse 67, temperature 98.4 F (36.9 C), temperature source Oral, height 5\' 9"  (1.753 m), weight 90.7 kg, SpO2 100 %.  PHYSICAL EXAMINATION:  Physical Exam  GENERAL:  85 y.o.-year-old patient lying in the bed . restless EYES: Pupils equal, round, reactive to light and accommodation. No scleral icterus. Extraocular muscles intact.  HEENT: Head atraumatic, normocephalic. Oropharynx and nasopharynx clear. No oropharyngeal erythema, moist oral mucosa  NECK:  Supple, no jugular venous distention. No thyroid enlargement, no tenderness.  LUNGS: Normal breath sounds bilaterally, no wheezing, rales, rhonchi. No use of accessory muscles of respiration.  CARDIOVASCULAR: S1, S2 normal. No murmurs, rubs, or gallops.  ABDOMEN: Soft, nontender, nondistended. Bowel sounds present. No organomegaly or mass.  EXTREMITIES: No pedal edema, cyanosis, or clubbing. + 2 pedal & radial pulses b/l.   NEUROLOGIC:  Cranial nerves II through XII are intact. No focal Motor or sensory deficits appreciated b/l PSYCHIATRIC: The patient is alert and awake. Confused. SKIN: No obvious rash, lesion, or ulcer.   LABORATORY PANEL:   CBC Recent Labs  Lab 06/06/18 2000  WBC 9.3  HGB 11.4*  HCT 34.4*  PLT 185   ------------------------------------------------------------------------------------------------------------------  Chemistries  Recent Labs  Lab 06/06/18 1501  NA 142  K 3.5  CL 107  CO2 28  GLUCOSE 227*  BUN 32*  CREATININE 0.93  CALCIUM 9.2  AST 18  ALT 20  ALKPHOS 76  BILITOT 0.6   ------------------------------------------------------------------------------------------------------------------  Cardiac Enzymes Recent Labs  Lab 06/06/18 1501  TROPONINI <0.03   ------------------------------------------------------------------------------------------------------------------  RADIOLOGY:  Ct Head Wo Contrast  Result Date: 06/06/2018 CLINICAL DATA:  Initial evaluation for acute headache status post trauma. EXAM: CT HEAD WITHOUT CONTRAST TECHNIQUE: Contiguous axial images were obtained from the base of the skull through the vertex without intravenous contrast. COMPARISON:  Prior CT from 04/20/2018 FINDINGS: Brain: Examination severely degraded by motion artifact. Generalized age-related cerebral atrophy with chronic microvascular ischemic disease. No acute intracranial hemorrhage. No acute large vessel territory infarct. No mass lesion, midline shift or mass effect. Mild diffuse ventricular prominence related global parenchymal volume loss of hydrocephalus. No definite extra-axial fluid collection on this motion degraded exam. Vascular: No hyperdense vessel. Scattered vascular calcifications noted within the carotid siphons. Skull: No visible scalp soft tissue injury. Calvarium grossly intact. Sinuses/Orbits: Globes and orbital soft tissues within normal limits. Visualized paranasal  sinuses and mastoid air cells are clear. Other: None. IMPRESSION: 1. Severely motion degraded exam. No definite acute intracranial process identified. 2. Generalized age-related cerebral atrophy with chronic microvascular ischemic disease. Electronically Signed   By: Jeannine Boga M.D.   On: 06/06/2018 16:58   Dg Chest Portable 1 View  Result Date: 06/06/2018 CLINICAL DATA:  Weakness for 1 day, shortness of breath for 2 weeks, type II diabetes mellitus, atrial fibrillation, hypertension, COPD, bladder cancer, former smoker EXAM: PORTABLE CHEST 1 VIEW COMPARISON:  Portable exam 1528 hours compared to 04/20/2018 FINDINGS: Enlargement of cardiac silhouette. Mediastinal contours and pulmonary vascularity normal. Atherosclerotic calcification aorta. Central peribronchial thickening and minimal chronic accentuation of interstitial markings appear unchanged. Minimal subsegmental atelectasis LEFT base. Remaining lungs clear. No pleural effusion or pneumothorax. IMPRESSION: Enlargement of cardiac silhouette. Bronchitic changes with minimal LEFT basilar atelectasis. Electronically Signed   By: Lavonia Dana M.D.   On: 06/06/2018 15:47     IMPRESSION AND PLAN:   * Lactic acidosis No signs of infection.  Likely dehydration.  Will treat with IV fluids.  With normal WBC and no source of infection will stop IV antibiotics.  Culture sent and pending.  *Shortness of breath.  Patient is poor historian.  Sats have been normal in the emergency room.  With lower extremity swelling will check lower extremity ultrasounds and CTA for pulmonary embolism.  Chest x-ray clear.  COVID-19 negative. He does seem to have chronic shortness of breath with his COPD and CHF.  *Dementia.  Monitor for inpatient delirium.  *Nonischemic cardiomyopathy with ejection fraction of 35%.  Monitor for any fluid overload.  *DVT prophylaxis with Lovenox  All the records are reviewed and case discussed with ED provider. Management plans  discussed with the patient, family and they are in agreement.  CODE STATUS: DNR/DNI  TOTAL TIME TAKING CARE OF THIS PATIENT: 40 minutes.   Leia Alf Alee Gressman M.D on 06/06/2018 at 8:20 PM  Between 7am to 6pm - Pager - [phone]  After 6pm go to [email or web address] - password [email or web address] [facility]  Office  [phone]  CC: Primary care physician; Leone Haven, MD  Note: This dictation was prepared with Dragon dictation along with smaller phrase technology. Any transcriptional errors that result from this process are unintentional.

## 2018-06-06 NOTE — Telephone Encounter (Signed)
Copied from [place] [id number]. Topic: General - Other >> Jun 05, 2018  2:43 PM Percell Belt A wrote: Reason for CRM:  [name] called in and said that pt rec'd a call for pre screening but she will have to be called to answer any question   [phone]- best number

## 2018-06-06 NOTE — Progress Notes (Signed)
Tommi Rumps, MD Phone: [phone]  Chase Simmons is a 85 y.o. male who presents today for follow-up.  Patient presents 2 hours late for his visit today.  His son notes he thought the visit was supposed to be at 1.  The patient had a skin tear to his left elbow when he came into the office.  His son reports that the patient had urinated on himself sometime this morning.  The patients is son does report he has had intermittent episodes of increased confusion.  These are not significantly worse than he has been in the past several months.  His son notes the patient was not able to get up on his own today due to pain in his ankles.  The patient reports that he was not able to stand up related to the pain in his ankles.  He notes no other leg pain.  The patient denies chest pain or shortness of breath.  The patient son does report the patient appeared short of breath when he was trying to get up out of his chair earlier.  He notes no cough or fever.  No abdominal pain.  No nausea or vomiting.  No diarrhea.  No numbness or weakness.  He has a chronic tremor that is unchanged.  Social History   Tobacco Use  Smoking Status Former Smoker  . Packs/day: 1.50  . Years: 40.00  . Pack years: 60.00  . Last attempt to quit: 02/03/2003  . Years since quitting: 15.3  Smokeless Tobacco Never Used     ROS see history of present illness  Objective  Physical Exam Vitals:   06/07/18 0920  BP: 128/60  Pulse: 62  Temp: 98.4 F (36.9 C)  SpO2: 97%    BP Readings from Last 3 Encounters:  06/07/18 (!) 145/75  06/07/18 128/60  04/24/18 (!) 134/107   Wt Readings from Last 3 Encounters:  06/06/18 200 lb (90.7 kg)  02/02/18 226 lb 9.6 oz (102.8 kg)  01/28/18 230 lb (104.3 kg)    Physical Exam Constitutional:      General: He is not in acute distress.    Appearance: He is not diaphoretic.  HENT:     Head: Normocephalic and atraumatic.     Mouth/Throat:     Mouth: Mucous membranes are  moist.     Pharynx: Oropharynx is clear.  Eyes:     Conjunctiva/sclera: Conjunctivae normal.  Cardiovascular:     Rate and Rhythm: Normal rate and regular rhythm.     Heart sounds: Normal heart sounds.  Pulmonary:     Effort: Pulmonary effort is normal.     Breath sounds: Normal breath sounds.  Abdominal:     General: Bowel sounds are normal. There is no distension.     Palpations: Abdomen is soft.     Tenderness: There is no abdominal tenderness.  Musculoskeletal:     Comments: 3+ pitting edema bilateral lower extremities to the upper shin, no tenderness of either ankle  Skin:    General: Skin is warm and dry.  Neurological:     Mental Status: He is alert.     Comments: CN 3-12 intact, 5/5 strength in bilateral biceps, triceps, grip, quads, hamstrings, plantar and dorsiflexion, sensation to light touch intact in bilateral UE and LE, patient unable to stand on own from wheel chair, alert, oriented to person and place, though not time   Left elbow wrapped with bandage   Assessment/Plan: Please see individual problem list.  Confusion This may  be representative of his dementia though there could be an underlying exacerbating factor.  Given confusion and weakness with inability to stand he will be evaluated in the emergency department.  EMS will transport the patient given the sounds difficulty transporting him to the office.  Bilateral leg edema Could be heart failure related.  He will have lab work in the ED to evaluate further.  Weakness This may be related to deconditioning though could be related to his discomfort in his ankle that he reports.  No obvious abnormalities on exam.  He will be evaluated in the emergency department.  Skin tear of elbow without complication This area was dressed and evaluated by our clinical RN.  To be evaluated in the ED.   No orders of the defined types were placed in this encounter.   No orders of the defined types were placed in this  encounter.    Tommi Rumps, MD [place]

## 2018-06-06 NOTE — ED Provider Notes (Signed)
[facility] Emergency Department Provider Note  ____________________________________________   None    (approximate)  I have reviewed the triage vital signs and the nursing notes.   HISTORY  Chief Complaint Weakness and Fall    HPI Chase Simmons is a 85 y.o. male presents emergency department via EMS from [facility].   He is stating he has weakness and shortness of breath, EMS is concerned about a UTI as he has had incontinence x2 in route to the emergency department.  He denies fever chills.  He denies chest pain at this time.  Patient seems to have a mild degree of dementia and is also a poor historian.   Past Medical History:  Diagnosis Date  . Allergy   . Bladder cancer ([place])    a. followed by Dr. Jacqlyn Larsen  . Chronic combined systolic (congestive) and diastolic (congestive) heart failure ([place])    a. 07/2016 Echo: EF 30-35%, sev mid-apicalanteroseptal, ant, inf HK, Gr1 DD.  Marland Kitchen COPD (chronic obstructive pulmonary disease) ([place])   . Hyperlipidemia   . Hypertension   . NICM (nonischemic cardiomyopathy) ([place])    a. 07/2016 Echo: EF 30-35%, sev mid-apicalanteroseptal, ant, inf HK, Gr1 DD, mild MR, mildly dil LA, PASP 73mHg - ? stress induced CM.  .Marland KitchenNonobstructive Coronary atherosclerosis    a. 07/2016 Cath: LM nl, LAD nl, D1 40ost, LCX 256mRCA nl, EF 25-30%.  . Marland KitchenAF (paroxysmal atrial fibrillation) ([place]   a. Dx 08/2013. CHA2DS2VASc = 6-->no OAC 2/2 h/o falls and nocompliance.  . Tremor, essential    sees [facility] neurologist  . Type 2 diabetes mellitus with complication, without long-term current use of insulin ([facility]7/30/2014  . Urinary incontinence     Patient Active Problem List   Diagnosis Date Noted  . Depression 04/20/2018  . Altered mental status   . Frequency of urination 03/01/2018  . Confusion 03/01/2018  . Bilateral leg edema 11/08/2016  . Vertigo 08/25/2016  . Nausea 08/01/2016  . CHF (congestive heart failure) ([facility]07/30/2018   . Atypical chest pain 07/20/2016  . COPD (chronic obstructive pulmonary disease) ([facility]03/13/2018  . Coronary atherosclerosis of native coronary artery 01/05/2016  . Aortic atherosclerosis ([facility]01/02/2016  . Bradycardia 01/05/2016  . Anxiety 06/17/2015  . Malignant neoplasm of urinary bladder ([facility]CH[place]06/14/2017  . Allergic rhinitis 05/14/2015  . Knee pain, bilateral 03/16/2015  . PAF (paroxysmal atrial fibrillation) (HC[place]08/28/2015  . Acid reflux 08/30/2013  . Tremor, essential 10/22/2012  . Hyperlipidemia 10/22/2012  . HTN (hypertension) 08/01/2012  . Type 2 diabetes mellitus with complication, without long-term current use of insulin (HC[place]07/30/2014  . Benign prostatic hyperplasia with urinary obstruction 09/02/2011    Past Surgical History:  Procedure Laterality Date  . bladder cancer    . CATARACT EXTRACTION    . HERNIA REPAIR    . LEFT HEART CATH AND CORONARY ANGIOGRAPHY N/A 07/11/2016   Procedure: Left Heart Cath and Coronary Angiography;  Surgeon: ArWellington HampshireMD;  [facility];  Service: Cardiovascular;  Laterality: N/A;    Prior to Admission medications   Medication Sig Start Date End Date Taking? Authorizing Provider  acetaminophen (TYLENOL) 325 MG tablet Take 2 tablets (650 mg total) by mouth every 6 (six) hours as needed for mild pain (or Fever >/= 101). 06/22/15  Yes Gouru, ArIllene SilverMD  atorvastatin (LIPITOR) 80 MG tablet TAKE 1 TABLET ONE TIME DAILY 06/15/17  Yes SoLeone HavenMD  gabapentin (NEURONTIN)  100 MG capsule Take 100 mg by mouth 3 (three) times daily.  05/22/18  Yes [provider]  glimepiride (AMARYL) 4 MG tablet take 1 tablet by mouth WITH BREAKFAST. 06/15/17  Yes Leone Haven, MD  lisinopril (PRINIVIL,ZESTRIL) 20 MG tablet Take 1 tablet (20 mg total) by mouth daily. 06/15/17  Yes Leone Haven, MD  metFORMIN (GLUCOPHAGE) 500 MG tablet TAKE 1 TABLET BY MOUTH TWICE A DAY 05/23/18  Yes Leone Haven, MD   pantoprazole (PROTONIX) 40 MG tablet Take 1 tablet (40 mg total) by mouth daily. 06/15/17  Yes Leone Haven, MD  PARoxetine (PAXIL) 40 MG tablet Take 1 tablet (40 mg total) by mouth daily. 01/08/18  Yes Guse, Jacquelynn Cree, FNP  QUEtiapine (SEROQUEL) 100 MG tablet Take 100 mg by mouth at bedtime.  05/22/18  Yes [provider]  tiotropium (SPIRIVA) 18 MCG inhalation capsule Place 18 mcg as needed into inhaler and inhale.    Yes [provider]    Allergies Latex  Family History  Problem Relation Age of Onset  . Arthritis Mother   . Dementia Mother   . Cancer Maternal Uncle        prostate  . Cancer Maternal Aunt        breast cancer    Social History Social History   Tobacco Use  . Smoking status: Former Smoker    Packs/day: 1.50    Years: 40.00    Pack years: 60.00    Last attempt to quit: 02/03/2003    Years since quitting: 15.3  . Smokeless tobacco: Never Used  Substance Use Topics  . Alcohol use: Yes    Alcohol/week: 12.0 standard drinks    Types: 12 Standard drinks or equivalent per week    Frequency: Never    Comment: 1 drink with dinner  . Drug use: No    Review of Systems  Constitutional: No fever/chills, weakness Eyes: No visual changes. ENT: No sore throat. Respiratory: Denies cough, shortness of breath Cardiovascular: Denies chest pain Intestinal: Denies vomiting or diarrhea Genitourinary: Negative for dysuria. Musculoskeletal: Negative for back pain. Skin: Negative for rash.    ____________________________________________   PHYSICAL EXAM:  VITAL SIGNS: ED Triage Vitals [06/06/18 1515]  Enc Vitals Group     BP      Pulse      Resp      Temp      Temp src      SpO2 100 %     Weight      Height      Head Circumference      Peak Flow      Pain Score      Pain Loc      Pain Edu?      Excl. in [place]?     Constitutional: Alert and oriented.  Ill appearing and in no acute distress. Eyes: Conjunctivae are normal.  Head:  Atraumatic. Nose: No congestion/rhinnorhea. Mouth/Throat: Mucous membranes are moist.   Neck:  supple no lymphadenopathy noted Cardiovascular: Normal rate, regular rhythm. Heart sounds are normal Respiratory: Normal respiratory effort.  No retractions, lungs c t a  Abd: soft nontender bs normal all 4 quad GU: deferred Musculoskeletal: FROM all extremities, warm and well perfused, no extremity swelling noted Neurologic:  Normal speech and language.  Skin:  Skin is warm, dry and intact. No rash noted. Psychiatric: Mood and affect are normal. Speech and behavior are normal.  ____________________________________________   LABS (all labs ordered are  listed, but only abnormal results are displayed)  Labs Reviewed  COMPREHENSIVE METABOLIC PANEL - Abnormal; Notable for the following components:      Result Value   Glucose, Bld 227 (*)    BUN 32 (*)    All other components within normal limits  BRAIN NATRIURETIC PEPTIDE - Abnormal; Notable for the following components:   B Natriuretic Peptide 285.0 (*)    All other components within normal limits  LACTIC ACID, PLASMA - Abnormal; Notable for the following components:   Lactic Acid, Venous 2.4 (*)    All other components within normal limits  URINALYSIS, COMPLETE (UACMP) WITH MICROSCOPIC - Abnormal; Notable for the following components:   Color, Urine YELLOW (*)    APPearance CLEAR (*)    Glucose, UA 150 (*)    Protein, ur 30 (*)    All other components within normal limits  SARS CORONAVIRUS 2 (HOSPITAL ORDER, [place] [facility])  CULTURE, BLOOD (ROUTINE X 2)  CULTURE, BLOOD (ROUTINE X 2)  TROPONIN I  LACTIC ACID, PLASMA  CBC WITH DIFFERENTIAL/PLATELET   ____________________________________________   ____________________________________________  RADIOLOGY  Chest x-ray is normal  ____________________________________________   PROCEDURES  Procedure(s) performed: EKG   Procedures     ____________________________________________   INITIAL IMPRESSION / ASSESSMENT AND PLAN / ED COURSE  Pertinent labs & imaging results that were available during my care of the patient were reviewed by me and considered in my medical decision making (see chart for details).   Patient is a 85 year old male presents emergency department via EMS from his PCP office.  Patient has been complaining of shortness of breath for 2 weeks and weakness for 2 days.  He denies fever, chills, chest pain at this time.  Physical exam patient does appear to be weak.  No extremity swelling is noted.  Remainder the exam is unremarkable.  EKG, chest x-ray, CBC, met C, troponin, BNP, lactic acid, blood cultures, UA  DDX: weakness, dehydration, chf, uti, intracranial abnormality   ----------------------------------------- 6:50 PM on 06/06/2018 ----------------------------------------- BNP is 285, lactic acid is elevated at 2.4, urinalysis is normal, comprehensive metabolic panel shows a BUN at 32 and glucose of 227, CBC is still pending  Discussed the case with Dr. Darvin Neighbours.  Due to the lactic acid being 2.4 we are unsure of the source admission for bacteremia.  Chase Simmons was evaluated in Emergency Department on 06/06/2018 for the symptoms described in the history of present illness. He was evaluated in the context of the global COVID-19 pandemic, which necessitated consideration that the patient might be at risk for infection with the SARS-CoV-2 virus that causes COVID-19. Institutional protocols and algorithms that pertain to the evaluation of patients at risk for COVID-19 are in a state of rapid change based on information released by regulatory bodies including the CDC and federal and state organizations. These policies and algorithms were followed during the patient's care in the ED.    As part of my medical decision making, I reviewed the following data within the [facility] notes  reviewed and incorporated, Labs reviewed see above, EKG interpreted NSR, Old chart reviewed, Radiograph reviewed chest x-ray is normal, Discussed with admitting physician Dr. Darvin Neighbours, Notes from prior ED visits and [place] Controlled Substance Database  ____________________________________________   FINAL CLINICAL IMPRESSION(S) / ED DIAGNOSES  Final diagnoses:  Weakness  Fall, initial encounter  Elevated lactic acid level      NEW MEDICATIONS STARTED DURING THIS VISIT:  New  Prescriptions   No medications on file     Note:  This document was prepared using Dragon voice recognition software and may include unintentional dictation errors.    Versie Starks, PA-C 06/06/18 Marko Stai    Nance Pear, MD 06/06/18 2034

## 2018-06-06 NOTE — ED Notes (Signed)
Patient repeatedly attempting to get out of bed and pulling off oxygen and O2 sensor. Patient following commands to lay back at this time, however, as soon as he lays down, he tries to get up again. Charge made aware and Air cabin crew requested. This RN remains at bedside until sitter arrives.

## 2018-06-07 ENCOUNTER — Encounter: Payer: Self-pay | Admitting: Family Medicine

## 2018-06-07 DIAGNOSIS — S51019A Laceration without foreign body of unspecified elbow, initial encounter: Secondary | ICD-10-CM | POA: Insufficient documentation

## 2018-06-07 DIAGNOSIS — E872 Acidosis: Secondary | ICD-10-CM | POA: Diagnosis not present

## 2018-06-07 DIAGNOSIS — I509 Heart failure, unspecified: Secondary | ICD-10-CM | POA: Diagnosis not present

## 2018-06-07 DIAGNOSIS — R531 Weakness: Secondary | ICD-10-CM | POA: Insufficient documentation

## 2018-06-07 DIAGNOSIS — R69 Illness, unspecified: Secondary | ICD-10-CM | POA: Diagnosis not present

## 2018-06-07 DIAGNOSIS — R0602 Shortness of breath: Secondary | ICD-10-CM | POA: Diagnosis not present

## 2018-06-07 LAB — BASIC METABOLIC PANEL
Anion gap: 7 (ref 5–15)
BUN: 21 mg/dL (ref 8–23)
CO2: 25 mmol/L (ref 22–32)
Calcium: 8.4 mg/dL — ABNORMAL LOW (ref 8.9–10.3)
Chloride: 110 mmol/L (ref 98–111)
Creatinine, Ser: 0.87 mg/dL (ref 0.61–1.24)
GFR calc Af Amer: >60 mL/min (ref >60)
GFR calc non Af Amer: >60 mL/min (ref >60)
Glucose, Bld: 152 mg/dL — ABNORMAL HIGH (ref 70–99)
Potassium: 3.4 mmol/L — ABNORMAL LOW (ref 3.5–5.1)
Sodium: 142 mmol/L (ref 135–145)

## 2018-06-07 LAB — GLUCOSE, CAPILLARY
Glucose-Capillary: 137 mg/dL — ABNORMAL HIGH (ref 70–99)
Glucose-Capillary: 163 mg/dL — ABNORMAL HIGH (ref 70–99)
Glucose-Capillary: 135 mg/dL — ABNORMAL HIGH (ref 70–99)
Glucose-Capillary: 119 mg/dL — ABNORMAL HIGH (ref 70–99)

## 2018-06-07 LAB — CBC
HCT: 33.7 % — ABNORMAL LOW (ref 39.0–52.0)
Hemoglobin: 11.3 g/dL — ABNORMAL LOW (ref 13.0–17.0)
MCH: 34.9 pg — ABNORMAL HIGH (ref 26.0–34.0)
MCHC: 33.5 g/dL (ref 30.0–36.0)
MCV: 104 fL — ABNORMAL HIGH (ref 80.0–100.0)
Platelets: 172 10*3/uL (ref 150–400)
RBC: 3.24 MIL/uL — ABNORMAL LOW (ref 4.22–5.81)
RDW: 12.4 % (ref 11.5–15.5)
WBC: 7.7 10*3/uL (ref 4.0–10.5)
nRBC: 0 % (ref 0.0–0.2)

## 2018-06-07 LAB — PROCALCITONIN: Procalcitonin: <0.1 ng/mL

## 2018-06-07 LAB — LACTIC ACID, PLASMA: Lactic Acid, Venous: 1 mmol/L (ref 0.5–1.9)

## 2018-06-07 MED ORDER — POTASSIUM CHLORIDE CRYS ER 20 MEQ PO TBCR
40.0000 meq | EXTENDED_RELEASE_TABLET | Freq: Once | ORAL | Status: AC
  Administered 2018-06-07: 40 meq via ORAL
  Filled 2018-06-07: qty 2

## 2018-06-07 NOTE — Progress Notes (Signed)
[facility] [place] NAME: Chase Simmons    MR#:  [id number]  DATE OF BIRTH:  06-05-1933  SUBJECTIVE:  CHIEF COMPLAINT:   Chief Complaint  Patient presents with   Weakness   Fall   -Apparently very weak at 78 office yesterday and was hypoxic.  Oxygen saturations are fine currently.  Physical therapy consult is pending  REVIEW OF SYSTEMS:  Review of Systems  Unable to perform ROS: Dementia    DRUG ALLERGIES:   Allergies  Allergen Reactions   Latex Itching    VITALS:  Blood pressure (!) 145/75, pulse (!) 46, temperature 97.8 F (36.6 C), temperature source Oral, resp. rate 16, height 5\' 9"  (1.753 m), weight 90.7 kg, SpO2 99 %.  PHYSICAL EXAMINATION:  Physical Exam   GENERAL:  85 y.o.-year-old elderly patient lying in the bed with no acute distress.  EYES: Pupils equal, round, reactive to light and accommodation. No scleral icterus. Extraocular muscles intact.  HEENT: Head atraumatic, normocephalic. Oropharynx and nasopharynx clear.  NECK:  Supple, no jugular venous distention. No thyroid enlargement, no tenderness.  LUNGS: Normal breath sounds bilaterally, no wheezing, rales,rhonchi or crepitation. No use of accessory muscles of respiration.  Decreased bibasilar breath sounds CARDIOVASCULAR: S1, S2 normal. No  rubs, or gallops. ,  2/6 systolic murmur is present ABDOMEN: Soft, nontender, nondistended. Bowel sounds present. No organomegaly or mass.  EXTREMITIES: No pedal edema, cyanosis, or clubbing.  Extensive tremors of both upper extremities noted.  Tremors of the lips noted as well NEUROLOGIC: Cranial nerves II through XII are intact. Muscle strength 5/5 in all extremities. Sensation intact. Gait not checked.  PSYCHIATRIC: The patient is alert but disoriented SKIN: No obvious rash, lesion, or ulcer.    LABORATORY PANEL:   CBC Recent Labs  Lab 06/07/18 0532  WBC 7.7  HGB 11.3*  HCT 33.7*  PLT 172    ------------------------------------------------------------------------------------------------------------------  Chemistries  Recent Labs  Lab 06/06/18 1501 06/07/18 0532  NA 142 142  K 3.5 3.4*  CL 107 110  CO2 28 25  GLUCOSE 227* 152*  BUN 32* 21  CREATININE 0.93 0.87  CALCIUM 9.2 8.4*  AST 18  --   ALT 20  --   ALKPHOS 76  --   BILITOT 0.6  --    ------------------------------------------------------------------------------------------------------------------  Cardiac Enzymes Recent Labs  Lab 06/06/18 1501  TROPONINI <0.03   ------------------------------------------------------------------------------------------------------------------  RADIOLOGY:  Ct Head Wo Contrast  Result Date: 06/06/2018 CLINICAL DATA:  Initial evaluation for acute headache status post trauma. EXAM: CT HEAD WITHOUT CONTRAST TECHNIQUE: Contiguous axial images were obtained from the base of the skull through the vertex without intravenous contrast. COMPARISON:  Prior CT from 04/20/2018 FINDINGS: Brain: Examination severely degraded by motion artifact. Generalized age-related cerebral atrophy with chronic microvascular ischemic disease. No acute intracranial hemorrhage. No acute large vessel territory infarct. No mass lesion, midline shift or mass effect. Mild diffuse ventricular prominence related global parenchymal volume loss of hydrocephalus. No definite extra-axial fluid collection on this motion degraded exam. Vascular: No hyperdense vessel. Scattered vascular calcifications noted within the carotid siphons. Skull: No visible scalp soft tissue injury. Calvarium grossly intact. Sinuses/Orbits: Globes and orbital soft tissues within normal limits. Visualized paranasal sinuses and mastoid air cells are clear. Other: None. IMPRESSION: 1. Severely motion degraded exam. No definite acute intracranial process identified. 2. Generalized age-related cerebral atrophy with chronic microvascular ischemic  disease. Electronically Signed   By: Jeannine Boga M.D.   On:  06/06/2018 16:58   Ct Angio Chest Pe W Or Wo Contrast  Result Date: 06/06/2018 CLINICAL DATA:  Shortness of breath EXAM: CT ANGIOGRAPHY CHEST WITH CONTRAST TECHNIQUE: Multidetector CT imaging of the chest was performed using the standard protocol during bolus administration of intravenous contrast. Multiplanar CT image reconstructions and MIPs were obtained to evaluate the vascular anatomy. CONTRAST:  57mL OMNIPAQUE IOHEXOL 350 MG/ML SOLN COMPARISON:  CT angiogram chest July 20, 2016 and chest radiograph June 06, 2018 FINDINGS: Cardiovascular: There is no demonstrable pulmonary embolus. There is no thoracic aortic aneurysm or dissection. There are foci of calcification in the visualized great vessels. There is aortic atherosclerosis. There are foci of coronary artery calcification. There is no pericardial effusion or pericardial thickening evident. The main pulmonary outflow tract is prominent, measuring 3.1 cm in diameter. Mediastinum/Nodes: Thyroid appears unremarkable. There are scattered subcentimeter mediastinal lymph nodes. There is a subcarinal lymph node measuring 1.9 x 1.6 cm. There is a lymph node immediately anterior to the carina measuring 1.3 x 1.3 cm. No esophageal lesions are evident. Lungs/Pleura: No demonstrable edema or consolidation. There is patchy atelectasis as well as mild chronic interstitial thickening. On axial slice 64 series 6, there is a 7 mm nodular opacity in the right lower lobe, essentially stable. No new parenchymal lung nodular lesions evident. No pleural effusion or pleural thickening. Upper Abdomen: There is cholelithiasis. There is aortic atherosclerosis in the upper abdomen. Visualized upper abdominal structures otherwise appear unremarkable. Musculoskeletal: Bones are osteoporotic. There is degenerative change in the thoracic spine. No focal blastic or lytic bone lesions are evident. There are no chest wall  lesions. Review of the MIP images confirms the above findings. IMPRESSION: 1. No demonstrable pulmonary embolus. No thoracic aortic aneurysm or dissection. There is aortic atherosclerosis as well as foci of calcification in great vessels and coronary arteries. 2. Prominence of the main pulmonary outflow tract, a finding indicative of a degree of pulmonary arterial hypertension. 3. No edema or consolidation. Areas of atelectatic change in chronic interstitial thickening. 7 mm nodular opacity right lower lobe, essentially stable. No pleural effusion. 4.  Mild adenopathy of uncertain etiology. 5.  Cholelithiasis. Aortic Atherosclerosis (ICD10-I70.0). Electronically Signed   By: Lowella Grip III M.D.   On: 06/06/2018 20:28   US Venous Img Lower Bilateral  Result Date: 06/06/2018 CLINICAL DATA:  Bilateral lower extremity edema EXAM: BILATERAL LOWER EXTREMITY VENOUS DUPLEX ULTRASOUND TECHNIQUE: Gray-scale sonography with graded compression, as well as color Doppler and duplex ultrasound were performed to evaluate the lower extremity deep venous systems from the level of the common femoral vein and including the common femoral, femoral, profunda femoral, popliteal and calf veins including the posterior tibial, peroneal and gastrocnemius veins when visible. The superficial great saphenous vein was also interrogated. Spectral Doppler was utilized to evaluate flow at rest and with distal augmentation maneuvers in the common femoral, femoral and popliteal veins. COMPARISON:  June 21, 2015. FINDINGS: RIGHT LOWER EXTREMITY Common Femoral Vein: No evidence of thrombus. Normal compressibility, respiratory phasicity and response to augmentation. Saphenofemoral Junction: No evidence of thrombus. Normal compressibility and flow on color Doppler imaging. Profunda Femoral Vein: No evidence of thrombus. Normal compressibility and flow on color Doppler imaging. Femoral Vein: No evidence of thrombus. Normal compressibility,  respiratory phasicity and response to augmentation. Popliteal Vein: No evidence of thrombus. Normal compressibility, respiratory phasicity and response to augmentation. Calf Veins: No evidence of thrombus. Normal compressibility and flow on color Doppler imaging. Superficial Great Saphenous Vein: No evidence of  thrombus. Normal compressibility. Venous Reflux:  None. Other Findings:  None. LEFT LOWER EXTREMITY Common Femoral Vein: No evidence of thrombus. Normal compressibility, respiratory phasicity and response to augmentation. Saphenofemoral Junction: No evidence of thrombus. Normal compressibility and flow on color Doppler imaging. Profunda Femoral Vein: No evidence of thrombus. Normal compressibility and flow on color Doppler imaging. Femoral Vein: No evidence of thrombus. Normal compressibility, respiratory phasicity and response to augmentation. Popliteal Vein: No evidence of thrombus. Normal compressibility, respiratory phasicity and response to augmentation. Calf Veins: No evidence of thrombus. Normal compressibility and flow on color Doppler imaging. Superficial Great Saphenous Vein: No evidence of thrombus. Normal compressibility. Venous Reflux:  None. Other Findings:  None. IMPRESSION: No evidence of deep venous thrombosis in either lower extremity. Electronically Signed   By: Lowella Grip III M.D.   On: 06/06/2018 21:39   Dg Chest Portable 1 View  Result Date: 06/06/2018 CLINICAL DATA:  Weakness for 1 day, shortness of breath for 2 weeks, type II diabetes mellitus, atrial fibrillation, hypertension, COPD, bladder cancer, former smoker EXAM: PORTABLE CHEST 1 VIEW COMPARISON:  Portable exam 1528 hours compared to 04/20/2018 FINDINGS: Enlargement of cardiac silhouette. Mediastinal contours and pulmonary vascularity normal. Atherosclerotic calcification aorta. Central peribronchial thickening and minimal chronic accentuation of interstitial markings appear unchanged. Minimal subsegmental atelectasis  LEFT base. Remaining lungs clear. No pleural effusion or pneumothorax. IMPRESSION: Enlargement of cardiac silhouette. Bronchitic changes with minimal LEFT basilar atelectasis. Electronically Signed   By: Lavonia Dana M.D.   On: 06/06/2018 15:47    EKG:   Orders placed or performed during the hospital encounter of 06/06/18   EKG 12-Lead   EKG 12-Lead   ED EKG   ED EKG    ASSESSMENT AND PLAN:   85 year old male with past medical history significant for dementia, atrial fibrillation, nonischemic cardiomyopathy with last EF of 35%, COPD not on home oxygen who is wheelchair-bound at baseline presents to hospital secondary to weakness and shortness of breath  1.  Shortness of breath with acute hypoxia-sats are normal on room air at this time.  Chest x-ray with some chronic bronchitis changes -Check procalcitonin, if elevated, will add antibiotics.  2.  Generalized weakness-at baseline wheelchair-bound but able to use handicapped bathroom, extremely weak and unable to get out of wheelchair yesterday.  Also lives at home by himself.  Family concerned about his safety at discharge. -Physical therapy and social worker consult requested.  3.  Mild lactic acidosis on admission-resolved.  4.  Hypokalemia-being replaced  5.  Hypertension-on lisinopril  6.  Diabetes mellitus-on low-dose Amaryl and sliding scale insulin  7.  Dementia-seems to be at baseline, pleasant and disoriented. -On Seroquel for mood  8.  DVT prophylaxis -Lovenox   Updated son and daughter over the phone today.    All the records are reviewed and case discussed with Care Management/Social Workerr. Management plans discussed with the patient, family and they are in agreement.  CODE STATUS: DNR  TOTAL TIME TAKING CARE OF THIS PATIENT: 39 minutes.   POSSIBLE D/C IN 2 DAYS, DEPENDING ON CLINICAL CONDITION.   Gladstone Lighter M.D on 06/07/2018 at 2:25 PM  Between 7am to 6pm - Pager - [phone]  After 6pm go  to [email or web address] - password [email or web address] [facility]  Office  [phone]  CC: Primary care physician; Leone Haven, MD

## 2018-06-07 NOTE — Evaluation (Signed)
Physical Therapy Evaluation Patient Details Name: Chase Simmons MRN: [id number] DOB: January 04, 1934 Today's Date: 06/07/2018   History of Present Illness  Patient is an 85 y/o male that presents with shortness of breath and progressive weakness. He was in a wheelchair at an outpatient MD office and noted O2 sats dropped to 71%. He has also fallen and hit his head recently, CT scan was negative.   Clinical Impression  Patient was recently seen at this facility and it appears he is somewhat ambulatory at baseline. He presents with confusion and difficulty with providing feedback as well as following instructions in this session. He is noted to have tremors especially in his UEs and requires mod-max A with bed mobility and requires bil UE and LE support at all times for seated balance. He is able to stand momentarily, but fatigues quickly and requires significant level of balance support/assistance. He would likely benefit from SNF placement at discharge given his decrease in functional mobility and lack of physical support at home.     Follow Up Recommendations SNF    Equipment Recommendations       Recommendations for Other Services       Precautions / Restrictions Precautions Precautions: Fall Restrictions Weight Bearing Restrictions: No      Mobility  Bed Mobility Overal bed mobility: Needs Assistance Bed Mobility: Supine to Sit;Sit to Supine     Supine to sit: Mod assist Sit to supine: Max assist;+2 for physical assistance   General bed mobility comments: Patient struggled to manage torso and demonstrated difficulty with command following/sequencing.   Transfers Overall transfer level: Needs assistance Equipment used: (Elevated bed surface ) Transfers: Sit to/from Stand Sit to Stand: From elevated surface;Mod assist;Max assist         General transfer comment: Patient began to complain of fatigue just after standing, required cuing for shifting weight anteriorly, required  substantial assistance with transitioning.   Ambulation/Gait             General Gait Details: Deferred   Stairs            Wheelchair Mobility    Modified Rankin (Stroke Patients Only)       Balance Overall balance assessment: Needs assistance Sitting-balance support: Bilateral upper extremity supported Sitting balance-Leahy Scale: Fair   Postural control: Posterior lean   Standing balance-Leahy Scale: Poor Standing balance comment: Leaned posteriorly and demonstrated poor trunkal/postural control.                              Pertinent Vitals/Pain Pain Assessment: Faces Faces Pain Scale: Hurts a little bit Pain Location: Unclear where, he did not point or provide any verbal information.  Pain Intervention(s): Limited activity within patient's tolerance    Home Living Family/patient expects to be discharged to:: Private residence Living Arrangements: Alone Available Help at Discharge: Family;Available PRN/intermittently Type of Home: Independent living facility Home Access: Elevator     Home Layout: One level Home Equipment: [place] - 2 wheels;Bedside commode;Wheelchair - manual Additional Comments: per notes pt lives alone in apartment complex, daughter's help with errands (per old notes)    Prior Function Level of Independence: Independent with assistive device(s)         Comments: Apparently until recently pt has been able to ambulate ad lib with walker (per recent note)     Hand Dominance   Dominant Hand: Right    Extremity/Trunk Assessment   Upper Extremity Assessment Upper  Extremity Assessment: Generalized weakness    Lower Extremity Assessment Lower Extremity Assessment: Generalized weakness       Communication   Communication: No difficulties  Cognition Arousal/Alertness: Lethargic Behavior During Therapy: Flat affect Overall Cognitive Status: Difficult to assess                                  General Comments: Garbled speech with intermittent command following observed.       General Comments      Exercises Other Exercises Other Exercises: Assisted RN staff with changing linens/garbs as patient had soiled the bed. Assisted with bed mobility (requiring mod-max A) and cuing for sequencing.    Assessment/Plan    PT Assessment Patient needs continued PT services  PT Problem List Decreased strength;Decreased coordination;Cardiopulmonary status limiting activity;Decreased cognition;Decreased activity tolerance;Decreased balance;Decreased safety awareness;Decreased mobility       PT Treatment Interventions DME instruction;Therapeutic exercise;Gait training;Balance training;Stair training;Neuromuscular re-education;Functional mobility training;Cognitive remediation;Therapeutic activities;Patient/family education    PT Goals (Current goals can be found in the Care Plan section)  Acute Rehab PT Goals Patient Stated Goal: To return home safely.  PT Goal Formulation: With patient Time For Goal Achievement: 06/21/18 Potential to Achieve Goals: Good    Frequency Min 2X/week   Barriers to discharge Decreased caregiver support      Co-evaluation               AM-PAC PT "6 Clicks" Mobility  Outcome Measure Help needed turning from your back to your side while in a flat bed without using bedrails?: A Lot Help needed moving from lying on your back to sitting on the side of a flat bed without using bedrails?: A Lot Help needed moving to and from a bed to a chair (including a wheelchair)?: A Lot Help needed standing up from a chair using your arms (e.g., wheelchair or bedside chair)?: A Lot Help needed to walk in hospital room?: Total Help needed climbing 3-5 steps with a railing? : Total 6 Click Score: 10    End of Session Equipment Utilized During Treatment: Gait belt Activity Tolerance: Patient tolerated treatment well;Patient limited by fatigue Patient left: in  bed;with call bell/phone within reach;with nursing/sitter in room Nurse Communication: Mobility status PT Visit Diagnosis: Difficulty in walking, not elsewhere classified (R26.2);Muscle weakness (generalized) (M62.81)    Time: 1735-6701 PT Time Calculation (min) (ACUTE ONLY): 26 min   Charges:   PT Evaluation $PT Eval Moderate Complexity: 1 Mod PT Treatments $Therapeutic Activity: 8-22 mins       Royce Macadamia PT, DPT, CSCS    06/07/2018, 3:07 PM

## 2018-06-07 NOTE — Assessment & Plan Note (Signed)
This may be related to deconditioning though could be related to his discomfort in his ankle that he reports.  No obvious abnormalities on exam.  He will be evaluated in the emergency department.

## 2018-06-07 NOTE — Care Management Obs Status (Signed)
MEDICARE OBSERVATION STATUS NOTIFICATION   Patient Details  Name: Chase Simmons MRN: [id number] Date of Birth: May 16, 1933   Medicare Observation Status Notification Given:  Yes    Elza Rafter, RN 06/07/2018, 4:33 PM

## 2018-06-07 NOTE — Assessment & Plan Note (Signed)
This may be representative of his dementia though there could be an underlying exacerbating factor.  Given confusion and weakness with inability to stand he will be evaluated in the emergency department.  EMS will transport the patient given the sounds difficulty transporting him to the office.

## 2018-06-07 NOTE — Assessment & Plan Note (Signed)
This area was dressed and evaluated by our clinical RN.  To be evaluated in the ED.

## 2018-06-07 NOTE — Plan of Care (Signed)
  Problem: Activity: Goal: Risk for activity intolerance will decrease Outcome: Progressing   Problem: Safety: Goal: Ability to remain free from injury will improve Outcome: Progressing  Low Problem: Skin Integrity: Goal: Risk for impaired skin integrity will decrease Outcome: Progressing   Skin cleansed/ mepitel placed

## 2018-06-07 NOTE — Assessment & Plan Note (Signed)
Could be heart failure related.  He will have lab work in the ED to evaluate further.

## 2018-06-07 NOTE — Progress Notes (Signed)
Pt arrived via stretcher from ED. Pt alert to self. Telemetry monitor placed and called to CCMD. Yellow socks placed. Low bed in place. Unable to finish admission due to pt with hx of dementia

## 2018-06-07 NOTE — TOC Initial Note (Signed)
Transition of Care [facility]) - Initial/Assessment Note    Patient Details  Name: Chase Simmons MRN: [id number] Date of Birth: 1933-07-10  Transition of Care [facility]) CM/SW Contact:    Elza Rafter, RN Phone Number: 06/07/2018, 4:34 PM  Clinical Narrative:    Patient is from home alone.  Currently sleeping and unable to wake.  Called daughter Chase Simmons.  Chase Simmons states he lives alone and he has confusion often.  Lately she and her siblings have been taking turns staying with him.  Now that jobs are opening back up they are not going to be able to stay with him as much.  They have concerns about him living alone.  They have started a Medicaid application with PCP.  They would be ok with him going to SR and transitions to LTC from there.  Obtains medications without difficulty and is current with PCP.    PT is recommending SNF.               Expected Discharge Plan: [place] Barriers to Discharge: Continued Medical Work up   Patient Goals and CMS Choice        Expected Discharge Plan and Services Expected Discharge Plan: [place] arrangements for the past 2 months: Single Family Home                                      Prior Living Arrangements/Services Living arrangements for the past 2 months: Single Family Home Lives with:: Self                   Activities of Daily Living   ADL Screening (condition at time of admission) Patient's cognitive ability adequate to safely complete daily activities?: No Is the patient deaf or have difficulty hearing?: Yes Does the patient have difficulty concentrating, remembering, or making decisions?: Yes  Permission Sought/Granted                  Emotional Assessment              Admission diagnosis:  Weakness [R53.1] Edema leg [R60.0] Elevated lactic acid level [R79.89] Fall, initial encounter [W19.XXXA] Patient Active Problem List   Diagnosis Date Noted  . Weakness  06/07/2018  . Skin tear of elbow without complication 15/40/0867  . Lactic acidosis 06/06/2018  . Depression 04/20/2018  . Altered mental status   . Frequency of urination 03/01/2018  . Confusion 03/01/2018  . Bilateral leg edema 11/08/2016  . Vertigo 08/25/2016  . Nausea 08/01/2016  . CHF (congestive heart failure) ([place]) 08/01/2016  . Atypical chest pain 07/20/2016  . COPD (chronic obstructive pulmonary disease) ([place]) 03/15/2016  . Coronary atherosclerosis of native coronary artery 01/05/2016  . Aortic atherosclerosis ([place]) 01/05/2016  . Bradycardia 01/05/2016  . Anxiety 06/17/2015  . Malignant neoplasm of urinary bladder ([place]) 06/17/2015  . Allergic rhinitis 05/14/2015  . Knee pain, bilateral 03/16/2015  . PAF (paroxysmal atrial fibrillation) ([place]) 08/30/2013  . Acid reflux 08/30/2013  . Tremor, essential 10/22/2012  . Hyperlipidemia 10/22/2012  . HTN (hypertension) 08/01/2012  . Type 2 diabetes mellitus with complication, without long-term current use of insulin ([place]) 08/01/2012  . Benign prostatic hyperplasia with urinary obstruction 09/02/2011   PCP:  Leone Haven, MD Pharmacy:   [id number] - [place], [place] [address] [place] [address] Phone:  [phone] Fax: [phone]     Social Determinants of Health (SDOH) Interventions    Readmission Risk Interventions Readmission Risk Prevention Plan 06/07/2018  Transportation Screening Complete  PCP or Specialist Appt within 3-5 Days Complete  HRI or [place] Complete  Social Work Consult for [place] Planning/Counseling Not Complete  Medication Review [employer]) Complete  Some recent data might be hidden

## 2018-06-08 DIAGNOSIS — E872 Acidosis: Secondary | ICD-10-CM | POA: Diagnosis not present

## 2018-06-08 DIAGNOSIS — R0602 Shortness of breath: Secondary | ICD-10-CM | POA: Diagnosis not present

## 2018-06-08 DIAGNOSIS — R69 Illness, unspecified: Secondary | ICD-10-CM | POA: Diagnosis not present

## 2018-06-08 DIAGNOSIS — I509 Heart failure, unspecified: Secondary | ICD-10-CM | POA: Diagnosis not present

## 2018-06-08 LAB — CBC
HCT: 36.7 % — ABNORMAL LOW (ref 39.0–52.0)
Hemoglobin: 12.4 g/dL — ABNORMAL LOW (ref 13.0–17.0)
MCH: 34.4 pg — ABNORMAL HIGH (ref 26.0–34.0)
MCHC: 33.8 g/dL (ref 30.0–36.0)
MCV: 101.9 fL — ABNORMAL HIGH (ref 80.0–100.0)
Platelets: 188 10*3/uL (ref 150–400)
RBC: 3.6 MIL/uL — ABNORMAL LOW (ref 4.22–5.81)
RDW: 12.3 % (ref 11.5–15.5)
WBC: 9.6 10*3/uL (ref 4.0–10.5)
nRBC: 0 % (ref 0.0–0.2)

## 2018-06-08 LAB — BASIC METABOLIC PANEL
Anion gap: 8 (ref 5–15)
BUN: 24 mg/dL — ABNORMAL HIGH (ref 8–23)
CO2: 25 mmol/L (ref 22–32)
Calcium: 8.6 mg/dL — ABNORMAL LOW (ref 8.9–10.3)
Chloride: 108 mmol/L (ref 98–111)
Creatinine, Ser: 0.96 mg/dL (ref 0.61–1.24)
GFR calc Af Amer: >60 mL/min (ref >60)
GFR calc non Af Amer: >60 mL/min (ref >60)
Glucose, Bld: 172 mg/dL — ABNORMAL HIGH (ref 70–99)
Potassium: 3.6 mmol/L (ref 3.5–5.1)
Sodium: 141 mmol/L (ref 135–145)

## 2018-06-08 LAB — GLUCOSE, CAPILLARY
Glucose-Capillary: 144 mg/dL — ABNORMAL HIGH (ref 70–99)
Glucose-Capillary: 165 mg/dL — ABNORMAL HIGH (ref 70–99)
Glucose-Capillary: 168 mg/dL — ABNORMAL HIGH (ref 70–99)
Glucose-Capillary: 172 mg/dL — ABNORMAL HIGH (ref 70–99)

## 2018-06-08 MED ORDER — ALBUTEROL SULFATE (2.5 MG/3ML) 0.083% IN NEBU: 2.5000 mg | INHALATION_SOLUTION | RESPIRATORY_TRACT | 12 refills | Status: DC | PRN

## 2018-06-08 MED ORDER — AMLODIPINE BESYLATE 5 MG PO TABS
2.5000 mg | ORAL_TABLET | Freq: Every day | ORAL | Status: DC
  Administered 2018-06-08 – 2018-06-13 (×6): 2.5 mg via ORAL
  Filled 2018-06-08 (×7): qty 1

## 2018-06-08 MED ORDER — AMLODIPINE BESYLATE 2.5 MG PO TABS: 2.5000 mg | ORAL_TABLET | Freq: Every day | ORAL | Status: DC

## 2018-06-08 MED ORDER — POLYETHYLENE GLYCOL 3350 17 G PO PACK: 17.0000 g | PACK | Freq: Every day | ORAL | 0 refills | Status: DC | PRN

## 2018-06-08 MED ORDER — ATORVASTATIN CALCIUM 20 MG PO TABS: 20.0000 mg | ORAL_TABLET | Freq: Every day | ORAL | 2 refills | Status: DC

## 2018-06-08 NOTE — Progress Notes (Signed)
Physical Therapy Treatment Patient Details Name: Chase Simmons MRN: [id number] DOB: 1933-07-09 Today's Date: 06/08/2018    History of Present Illness Patient is an 85 y/o male that presents with shortness of breath and progressive weakness. He was in a wheelchair at an outpatient MD office and noted O2 sats dropped to 71%. He has also fallen and hit his head recently, CT scan was negative.     PT Comments    Pt in bed, had removed gown, covers and tele-monitor.  Nurse tech called to replace leads in the correct order.  Pt trying to get up out of bed upon arrival.  Pt had difficult time waiting for second assist and kept stating "Help me" while pulling up on my arms.  Pt remained supine until +2 arrived.  To edge of bed with mod a x 1.  He was able to sit without support and seemed generally calmer when sitting.  He was unable to stand despite multiple attempts and after several minutes was assisted back to supine with max a x 2 where he seemed more comfortable and calmer.  Bed alarm on and needs in reach.   Follow Up Recommendations  SNF     Equipment Recommendations  Rolling walker with 5" wheels;Wheelchair (measurements PT);Wheelchair cushion (measurements PT);3in1 (PT)    Recommendations for Other Services       Precautions / Restrictions Precautions Precautions: Fall Restrictions Weight Bearing Restrictions: No    Mobility  Bed Mobility Overal bed mobility: Needs Assistance Bed Mobility: Supine to Sit;Sit to Supine     Supine to sit: Mod assist;Max assist;+2 for safety/equipment Sit to supine: Max assist;+2 for safety/equipment      Transfers Overall transfer level: Needs assistance Equipment used: Rolling walker (2 wheeled)   Sit to Stand: From elevated surface;Mod assist;Max assist         General transfer comment: unable to clear hips off bed.  should be hoyer lift transfer if needed to be OOB to chair at this time.  Ambulation/Gait              General Gait Details: unable to stand for gait.   Stairs             Wheelchair Mobility    Modified Rankin (Stroke Patients Only)       Balance Overall balance assessment: Needs assistance Sitting-balance support: Bilateral upper extremity supported Sitting balance-Leahy Scale: Fair Sitting balance - Comments: unsafe to be left unattended but did not need support to sit.     Standing balance support: Bilateral upper extremity supported Standing balance-Leahy Scale: Zero Standing balance comment: unable to stand                            Cognition Arousal/Alertness: Lethargic Behavior During Therapy: Restless                                   General Comments: Garbled speech with intermittent command following observed.       Exercises Other Exercises Other Exercises: attempted exercised but pt resisted and was uanble to follow directions for task.    General Comments        Pertinent Vitals/Pain Pain Assessment: No/denies pain    Home Living                      Prior Function  PT Goals (current goals can now be found in the care plan section) Progress towards PT goals: Not progressing toward goals - comment    Frequency    Min 2X/week      PT Plan Current plan remains appropriate    Co-evaluation              AM-PAC PT "6 Clicks" Mobility   Outcome Measure  Help needed turning from your back to your side while in a flat bed without using bedrails?: A Lot Help needed moving from lying on your back to sitting on the side of a flat bed without using bedrails?: A Lot Help needed moving to and from a bed to a chair (including a wheelchair)?: Total Help needed standing up from a chair using your arms (e.g., wheelchair or bedside chair)?: Total Help needed to walk in hospital room?: Total Help needed climbing 3-5 steps with a railing? : Total 6 Click Score: 8    End of Session Equipment  Utilized During Treatment: Gait belt Activity Tolerance: Patient limited by fatigue;Treatment limited secondary to agitation Patient left: in bed;with call bell/phone within reach;with bed alarm set Nurse Communication: Other (comment)       Time: 4239-5320 PT Time Calculation (min) (ACUTE ONLY): 24 min  Charges:  $Therapeutic Exercise: 8-22 mins $Therapeutic Activity: 8-22 mins                     Chesley Noon, PTA 06/08/18, 2:23 PM

## 2018-06-08 NOTE — Plan of Care (Signed)
  Problem: Clinical Measurements: Goal: Respiratory complications will improve Outcome: Progressing Goal: Cardiovascular complication will be avoided Outcome: Progressing   Problem: Pain Managment: Goal: General experience of comfort will improve Outcome: Progressing

## 2018-06-08 NOTE — Progress Notes (Addendum)
[facility] [place] NAME: Chase Simmons    MR#:  [id number]  DATE OF BIRTH:  Aug 28, 1933  SUBJECTIVE:  CHIEF COMPLAINT:   Chief Complaint  Patient presents with   Weakness   Fall   -Patient is confused due to underlying dementia.  Extremely weak, requires rehab at disposition.  Social worker aware  REVIEW OF SYSTEMS:  Review of Systems  Unable to perform ROS: Dementia    DRUG ALLERGIES:   Allergies  Allergen Reactions   Latex Itching    VITALS:  Blood pressure 125/77, pulse 65, temperature 98 F (36.7 C), temperature source Axillary, resp. rate 18, height 5\' 9"  (1.753 m), weight 90.7 kg, SpO2 98 %.  PHYSICAL EXAMINATION:  Physical Exam   GENERAL:  85 y.o.-year-old elderly patient lying in the bed with no acute distress.  EYES: Pupils equal, round, reactive to light and accommodation. No scleral icterus. Extraocular muscles intact.  HEENT: Head atraumatic, normocephalic. Oropharynx and nasopharynx clear.  NECK:  Supple, no jugular venous distention. No thyroid enlargement, no tenderness.  LUNGS: Normal breath sounds bilaterally, no wheezing, rales,rhonchi or crepitation. No use of accessory muscles of respiration.  Decreased bibasilar breath sounds CARDIOVASCULAR: S1, S2 normal. No  rubs, or gallops. ,  2/6 systolic murmur is present ABDOMEN: Soft, nontender, nondistended. Bowel sounds present. No organomegaly or mass.  EXTREMITIES: No pedal edema, cyanosis, or clubbing.  Extensive tremors of both upper extremities noted.  Tremors of the lips noted as well NEUROLOGIC: Cranial nerves II through XII are intact. Muscle strength equal in all extremities.  Extreme upper extremity tremors noted.  Sensation intact. Gait not checked.  PSYCHIATRIC: The patient is alert but disoriented SKIN: No obvious rash, lesion, or ulcer.  Ecchymosis noted on both upper extremities.   LABORATORY PANEL:   CBC Recent Labs  Lab 06/08/18 0553    WBC 9.6  HGB 12.4*  HCT 36.7*  PLT 188   ------------------------------------------------------------------------------------------------------------------  Chemistries  Recent Labs  Lab 06/06/18 1501  06/08/18 0553  NA 142   < > 141  K 3.5   < > 3.6  CL 107   < > 108  CO2 28   < > 25  GLUCOSE 227*   < > 172*  BUN 32*   < > 24*  CREATININE 0.93   < > 0.96  CALCIUM 9.2   < > 8.6*  AST 18  --   --   ALT 20  --   --   ALKPHOS 76  --   --   BILITOT 0.6  --   --    < > = values in this interval not displayed.   ------------------------------------------------------------------------------------------------------------------  Cardiac Enzymes Recent Labs  Lab 06/06/18 1501  TROPONINI <0.03   ------------------------------------------------------------------------------------------------------------------  RADIOLOGY:  Ct Head Wo Contrast  Result Date: 06/06/2018 CLINICAL DATA:  Initial evaluation for acute headache status post trauma. EXAM: CT HEAD WITHOUT CONTRAST TECHNIQUE: Contiguous axial images were obtained from the base of the skull through the vertex without intravenous contrast. COMPARISON:  Prior CT from 04/20/2018 FINDINGS: Brain: Examination severely degraded by motion artifact. Generalized age-related cerebral atrophy with chronic microvascular ischemic disease. No acute intracranial hemorrhage. No acute large vessel territory infarct. No mass lesion, midline shift or mass effect. Mild diffuse ventricular prominence related global parenchymal volume loss of hydrocephalus. No definite extra-axial fluid collection on this motion degraded exam. Vascular: No hyperdense vessel. Scattered vascular calcifications noted within the carotid siphons. Skull:  No visible scalp soft tissue injury. Calvarium grossly intact. Sinuses/Orbits: Globes and orbital soft tissues within normal limits. Visualized paranasal sinuses and mastoid air cells are clear. Other: None. IMPRESSION: 1.  Severely motion degraded exam. No definite acute intracranial process identified. 2. Generalized age-related cerebral atrophy with chronic microvascular ischemic disease. Electronically Signed   By: Jeannine Boga M.D.   On: 06/06/2018 16:58   Ct Angio Chest Pe W Or Wo Contrast  Result Date: 06/06/2018 CLINICAL DATA:  Shortness of breath EXAM: CT ANGIOGRAPHY CHEST WITH CONTRAST TECHNIQUE: Multidetector CT imaging of the chest was performed using the standard protocol during bolus administration of intravenous contrast. Multiplanar CT image reconstructions and MIPs were obtained to evaluate the vascular anatomy. CONTRAST:  26mL OMNIPAQUE IOHEXOL 350 MG/ML SOLN COMPARISON:  CT angiogram chest July 20, 2016 and chest radiograph June 06, 2018 FINDINGS: Cardiovascular: There is no demonstrable pulmonary embolus. There is no thoracic aortic aneurysm or dissection. There are foci of calcification in the visualized great vessels. There is aortic atherosclerosis. There are foci of coronary artery calcification. There is no pericardial effusion or pericardial thickening evident. The main pulmonary outflow tract is prominent, measuring 3.1 cm in diameter. Mediastinum/Nodes: Thyroid appears unremarkable. There are scattered subcentimeter mediastinal lymph nodes. There is a subcarinal lymph node measuring 1.9 x 1.6 cm. There is a lymph node immediately anterior to the carina measuring 1.3 x 1.3 cm. No esophageal lesions are evident. Lungs/Pleura: No demonstrable edema or consolidation. There is patchy atelectasis as well as mild chronic interstitial thickening. On axial slice 64 series 6, there is a 7 mm nodular opacity in the right lower lobe, essentially stable. No new parenchymal lung nodular lesions evident. No pleural effusion or pleural thickening. Upper Abdomen: There is cholelithiasis. There is aortic atherosclerosis in the upper abdomen. Visualized upper abdominal structures otherwise appear unremarkable.  Musculoskeletal: Bones are osteoporotic. There is degenerative change in the thoracic spine. No focal blastic or lytic bone lesions are evident. There are no chest wall lesions. Review of the MIP images confirms the above findings. IMPRESSION: 1. No demonstrable pulmonary embolus. No thoracic aortic aneurysm or dissection. There is aortic atherosclerosis as well as foci of calcification in great vessels and coronary arteries. 2. Prominence of the main pulmonary outflow tract, a finding indicative of a degree of pulmonary arterial hypertension. 3. No edema or consolidation. Areas of atelectatic change in chronic interstitial thickening. 7 mm nodular opacity right lower lobe, essentially stable. No pleural effusion. 4.  Mild adenopathy of uncertain etiology. 5.  Cholelithiasis. Aortic Atherosclerosis (ICD10-I70.0). Electronically Signed   By: Lowella Grip III M.D.   On: 06/06/2018 20:28   US Venous Img Lower Bilateral  Result Date: 06/06/2018 CLINICAL DATA:  Bilateral lower extremity edema EXAM: BILATERAL LOWER EXTREMITY VENOUS DUPLEX ULTRASOUND TECHNIQUE: Gray-scale sonography with graded compression, as well as color Doppler and duplex ultrasound were performed to evaluate the lower extremity deep venous systems from the level of the common femoral vein and including the common femoral, femoral, profunda femoral, popliteal and calf veins including the posterior tibial, peroneal and gastrocnemius veins when visible. The superficial great saphenous vein was also interrogated. Spectral Doppler was utilized to evaluate flow at rest and with distal augmentation maneuvers in the common femoral, femoral and popliteal veins. COMPARISON:  June 21, 2015. FINDINGS: RIGHT LOWER EXTREMITY Common Femoral Vein: No evidence of thrombus. Normal compressibility, respiratory phasicity and response to augmentation. Saphenofemoral Junction: No evidence of thrombus. Normal compressibility and flow on color Doppler imaging.  Profunda Femoral Vein: No evidence of thrombus. Normal compressibility and flow on color Doppler imaging. Femoral Vein: No evidence of thrombus. Normal compressibility, respiratory phasicity and response to augmentation. Popliteal Vein: No evidence of thrombus. Normal compressibility, respiratory phasicity and response to augmentation. Calf Veins: No evidence of thrombus. Normal compressibility and flow on color Doppler imaging. Superficial Great Saphenous Vein: No evidence of thrombus. Normal compressibility. Venous Reflux:  None. Other Findings:  None. LEFT LOWER EXTREMITY Common Femoral Vein: No evidence of thrombus. Normal compressibility, respiratory phasicity and response to augmentation. Saphenofemoral Junction: No evidence of thrombus. Normal compressibility and flow on color Doppler imaging. Profunda Femoral Vein: No evidence of thrombus. Normal compressibility and flow on color Doppler imaging. Femoral Vein: No evidence of thrombus. Normal compressibility, respiratory phasicity and response to augmentation. Popliteal Vein: No evidence of thrombus. Normal compressibility, respiratory phasicity and response to augmentation. Calf Veins: No evidence of thrombus. Normal compressibility and flow on color Doppler imaging. Superficial Great Saphenous Vein: No evidence of thrombus. Normal compressibility. Venous Reflux:  None. Other Findings:  None. IMPRESSION: No evidence of deep venous thrombosis in either lower extremity. Electronically Signed   By: Lowella Grip III M.D.   On: 06/06/2018 21:39   Dg Chest Portable 1 View  Result Date: 06/06/2018 CLINICAL DATA:  Weakness for 1 day, shortness of breath for 2 weeks, type II diabetes mellitus, atrial fibrillation, hypertension, COPD, bladder cancer, former smoker EXAM: PORTABLE CHEST 1 VIEW COMPARISON:  Portable exam 1528 hours compared to 04/20/2018 FINDINGS: Enlargement of cardiac silhouette. Mediastinal contours and pulmonary vascularity normal.  Atherosclerotic calcification aorta. Central peribronchial thickening and minimal chronic accentuation of interstitial markings appear unchanged. Minimal subsegmental atelectasis LEFT base. Remaining lungs clear. No pleural effusion or pneumothorax. IMPRESSION: Enlargement of cardiac silhouette. Bronchitic changes with minimal LEFT basilar atelectasis. Electronically Signed   By: Lavonia Dana M.D.   On: 06/06/2018 15:47    EKG:   Orders placed or performed during the hospital encounter of 06/06/18   EKG 12-Lead   EKG 12-Lead   ED EKG   ED EKG    ASSESSMENT AND PLAN:   85 year old male with past medical history significant for dementia, atrial fibrillation, nonischemic cardiomyopathy with last EF of 35%, COPD not on home oxygen who is wheelchair-bound at baseline presents to hospital secondary to weakness and shortness of breath  1.  Shortness of breath with acute hypoxia-sats are normal on room air at this time.  Chest x-ray with some chronic bronchitis changes-continue inhalers and as needed nebs. -Negative procalcitonin, so not on antibiotics.  2.  Generalized weakness-at baseline wheelchair-bound but able to use handicapped bathroom, extremely weak and unable to get out of wheelchair yesterday.  Also lives at home by himself.  Family concerned about his safety at discharge. -Physical therapy and social worker consult requested. -Patient will need rehab at discharge and then long-term care after that.  3.  Mild lactic acidosis on admission-resolved.  4.  Hypokalemia- replaced  5.  Hypertension-on lisinopril  6.  Diabetes mellitus-on low-dose Amaryl and sliding scale insulin  7.  Dementia-seems to be at baseline, pleasant and disoriented. -On Seroquel for mood  8.  DVT prophylaxis -Lovenox   Updated son and daughter over the phone yesterday, left a voicemail for the daughter today. -Awaiting insurance authorization for placement.    All the records are reviewed and case  discussed with Care Management/Social Workerr. Management plans discussed with the patient, family and they are in agreement.  CODE STATUS: DNR  TOTAL TIME TAKING CARE OF THIS PATIENT: 38 minutes.   POSSIBLE D/C IN 1-2 DAYS, DEPENDING ON CLINICAL CONDITION.   Gladstone Lighter M.D on 06/08/2018 at 1:13 PM  Between 7am to 6pm - Pager - [phone]  After 6pm go to [email or web address] - password [email or web address] [facility]  Office  [phone]  CC: Primary care physician; Leone Haven, MD

## 2018-06-08 NOTE — TOC Progression Note (Signed)
Transition of Care [facility]) - Progression Note    Patient Details  Name: Chase Simmons MRN: [id number] Date of Birth: [birth year]  Transition of [facility]) CM/SW Contact  Ross Ludwig, [place] Phone Number: 06/08/2018, 2:09 PM  Clinical Narrative:     CSW spoke to patient's family and they would like patient to go to [facility] [place], [place] awaiting insurance authorization for patient to go to SNF.   Expected Discharge Plan: Skilled Nursing Facility Barriers to Discharge: Continued Medical Work up  Expected Discharge Plan and Services Expected Discharge Plan: [place] arrangements for the past 2 months: Single Family Home                                       Social Determinants of Health (SDOH) Interventions    Readmission Risk Interventions Readmission Risk Prevention Plan 06/07/2018  Transportation Screening Complete  PCP or Specialist Appt within 3-5 Days Complete  HRI or [place] Complete  Social Work Consult for [place] Planning/Counseling Not Complete  Medication Review Press photographer) Complete  Some recent data might be hidden

## 2018-06-08 NOTE — NC FL2 (Signed)
[place] LEVEL OF CARE SCREENING TOOL     IDENTIFICATION  Patient Name: Chase Simmons Birthdate: Dec 22, 1933 Sex: male Admission Date (Current Location): 06/06/2018  [place] and [place] Number:  Engineering geologist and Address:  [facility], [address], [place], [place] [address]      Provider Number: [id number]  Attending Physician Name and Address:  Gladstone Lighter, MD  Relative Name and Phone Number:  Jack, [place] Daughter   [phone]     Current Level of Care: Hospital Recommended Level of Care: [place] Prior Approval Number:    Date Approved/Denied:   PASRR Number: [id number]  Discharge Plan: SNF    Current Diagnoses: Patient Active Problem List   Diagnosis Date Noted  . Weakness 06/07/2018  . Skin tear of elbow without complication 97/67/3419  . Lactic acidosis 06/06/2018  . Depression 04/20/2018  . Altered mental status   . Frequency of urination 03/01/2018  . Confusion 03/01/2018  . Bilateral leg edema 11/08/2016  . Vertigo 08/25/2016  . Nausea 08/01/2016  . CHF (congestive heart failure) ([place]) 08/01/2016  . Atypical chest pain 07/20/2016  . COPD (chronic obstructive pulmonary disease) ([place]) 03/15/2016  . Coronary atherosclerosis of native coronary artery 01/05/2016  . Aortic atherosclerosis ([place]) 01/05/2016  . Bradycardia 01/05/2016  . Anxiety 06/17/2015  . Malignant neoplasm of urinary bladder ([place]) 06/17/2015  . Allergic rhinitis 05/14/2015  . Knee pain, bilateral 03/16/2015  . PAF (paroxysmal atrial fibrillation) ([place]) 08/30/2013  . Acid reflux 08/30/2013  . Tremor, essential 10/22/2012  . Hyperlipidemia 10/22/2012  . HTN (hypertension) 08/01/2012  . Type 2 diabetes mellitus with complication, without long-term current use of insulin ([place]) 08/01/2012  . Benign prostatic hyperplasia with urinary obstruction 09/02/2011    Orientation RESPIRATION BLADDER Height & Weight      Self  Normal Incontinent Weight: 200 lb (90.7 kg) Height:  5\' 9"  (175.3 cm)  BEHAVIORAL SYMPTOMS/MOOD NEUROLOGICAL BOWEL NUTRITION STATUS      Continent Diet(Carb Modified)  AMBULATORY STATUS COMMUNICATION OF NEEDS Skin   Limited Assist Verbally Normal                       Personal Care Assistance Level of Assistance  Bathing, Feeding, Dressing Bathing Assistance: Limited assistance Feeding assistance: Limited assistance Dressing Assistance: Limited assistance     Functional Limitations Info  Sight, Hearing, Speech Sight Info: Adequate Hearing Info: Adequate Speech Info: Adequate    SPECIAL CARE FACTORS FREQUENCY  PT (By licensed PT), OT (By licensed OT)     PT Frequency: Minimum 5x a week OT Frequency: Minimum 5x a week            Contractures Contractures Info: Not present    Additional Factors Info  Code Status, Allergies, Psychotropic, Insulin Sliding Scale Code Status Info: DNR Allergies Info: Latex Psychotropic Info: QUEtiapine (SEROQUEL) tablet 100 mg and PARoxetine (PAXIL) tablet 40 mg  Insulin Sliding Scale Info: insulin aspart (novoLOG) injection 0-9 Units 3x a day with meals       Current Medications (06/08/2018):  This is the current hospital active medication list Current Facility-Administered Medications  Medication Dose Route Frequency Provider Last Rate Last Dose  . acetaminophen (TYLENOL) tablet 650 mg  650 mg Oral Q6H PRN Hillary Bow, MD   650 mg at 06/06/18 2351   Or  . acetaminophen (TYLENOL) suppository 650 mg  650 mg Rectal Q6H PRN Hillary Bow, MD      . albuterol ([place]) (  2.5 MG/3ML) 0.083% nebulizer solution 2.5 mg  2.5 mg Nebulization Q2H PRN Sudini, Alveta Heimlich, MD      . amLODipine (NORVASC) tablet 2.5 mg  2.5 mg Oral Daily Gladstone Lighter, MD   2.5 mg at 06/08/18 0902  . atorvastatin (LIPITOR) tablet 20 mg  20 mg Oral q1800 Hillary Bow, MD   20 mg at 06/07/18 1710  . enoxaparin (LOVENOX) injection 40 mg  40 mg  Subcutaneous Q24H Hillary Bow, MD   40 mg at 06/07/18 2235  . gabapentin (NEURONTIN) capsule 100 mg  100 mg Oral TID Hillary Bow, MD   100 mg at 06/08/18 0901  . glimepiride (AMARYL) tablet 1 mg  1 mg Oral Q breakfast Hillary Bow, MD   1 mg at 06/08/18 0858  . insulin aspart (novoLOG) injection 0-5 Units  0-5 Units Subcutaneous QHS Sudini, Srikar, MD      . insulin aspart (novoLOG) injection 0-9 Units  0-9 Units Subcutaneous TID WC Hillary Bow, MD   1 Units at 06/08/18 0856  . lisinopril (ZESTRIL) tablet 20 mg  20 mg Oral Daily Hillary Bow, MD   20 mg at 06/08/18 0901  . LORazepam (ATIVAN) injection 1 mg  1 mg Intravenous Q4H PRN Hillary Bow, MD   1 mg at 06/07/18 1855  . ondansetron (ZOFRAN) tablet 4 mg  4 mg Oral Q6H PRN Hillary Bow, MD       Or  . ondansetron (ZOFRAN) injection 4 mg  4 mg Intravenous Q6H PRN Sudini, Srikar, MD      . pantoprazole (PROTONIX) EC tablet 40 mg  40 mg Oral Daily Hillary Bow, MD   40 mg at 06/08/18 0901  . PARoxetine (PAXIL) tablet 40 mg  40 mg Oral Daily Hillary Bow, MD   40 mg at 06/08/18 0901  . polyethylene glycol (MIRALAX / GLYCOLAX) packet 17 g  17 g Oral Daily PRN Sudini, Alveta Heimlich, MD      . QUEtiapine (SEROQUEL) tablet 100 mg  100 mg Oral QHS Hillary Bow, MD   100 mg at 06/07/18 2234  . tiotropium (SPIRIVA) inhalation capsule ([facility] use ONLY) 18 mcg  18 mcg Inhalation Daily Hillary Bow, MD   18 mcg at 06/08/18 0240     Discharge Medications: Please see discharge summary for a list of discharge medications.  Relevant Imaging Results:  Relevant Lab Results:   Additional Information SSN : [id number]  Ross Ludwig, LCSW

## 2018-06-08 NOTE — Discharge Summary (Signed)
[facility] [place] NAME: Chase Simmons    MR#:  [id number]  DATE OF BIRTH:  03/05/1933  DATE OF ADMISSION:  06/06/2018   ADMITTING PHYSICIAN: Hillary Bow, MD  DATE OF DISCHARGE:  06/08/18  PRIMARY CARE PHYSICIAN: Leone Haven, MD   ADMISSION DIAGNOSIS:   Weakness [R53.1] Edema leg [R60.0] Elevated lactic acid level [R79.89] Fall, initial encounter [W19.XXXA]  DISCHARGE DIAGNOSIS:   Active Problems:   Lactic acidosis   SECONDARY DIAGNOSIS:   Past Medical History:  Diagnosis Date  . Allergy   . Bladder cancer ([place])    a. followed by Dr. Jacqlyn Larsen  . Chronic combined systolic (congestive) and diastolic (congestive) heart failure ([place])    a. 07/2016 Echo: EF 30-35%, sev mid-apicalanteroseptal, ant, inf HK, Gr1 DD.  Marland Kitchen COPD (chronic obstructive pulmonary disease) ([place])   . Hyperlipidemia   . Hypertension   . NICM (nonischemic cardiomyopathy) ([place])    a. 07/2016 Echo: EF 30-35%, sev mid-apicalanteroseptal, ant, inf HK, Gr1 DD, mild MR, mildly dil LA, PASP 58mmHg - ? stress induced CM.  Marland Kitchen Nonobstructive Coronary atherosclerosis    a. 07/2016 Cath: LM nl, LAD nl, D1 40ost, LCX 78m, RCA nl, EF 25-30%.  Marland Kitchen PAF (paroxysmal atrial fibrillation) ([place])    a. Dx 08/2013. CHA2DS2VASc = 6-->no OAC 2/2 h/o falls and nocompliance.  . Tremor, essential    sees [facility] neurologist  . Type 2 diabetes mellitus with complication, without long-term current use of insulin ([place]) 08/01/2012  . Urinary incontinence     HOSPITAL COURSE:   85 year old male with past medical history significant for dementia, atrial fibrillation, nonischemic cardiomyopathy with last EF of 35%, COPD not on home oxygen who is wheelchair-bound at baseline presents to hospital secondary to weakness and shortness of breath  1.  Shortness of breath with acute hypoxia-sats are normal on room air at this time.  Chest x-ray with some chronic bronchitis changes-continue inhalers and  as needed nebs. -Negative procalcitonin, so not on antibiotics.  2.  Generalized weakness-at baseline wheelchair-bound but able to use handicapped bathroom, extremely weak and unable to get out of wheelchair yesterday.  Also lives at home by himself.  Family concerned about his safety at discharge. -Physical therapy and social worker consult requested. -Patient will need rehab at discharge and then long-term care after that.  3.  Mild lactic acidosis on admission-resolved.  4.  Hypokalemia- replaced  5.  Hypertension-on lisinopril, low dose norvasc added  6.  Diabetes mellitus-on low-dose Amaryl and metformin  7.  Dementia-seems to be at baseline, pleasant and disoriented. -On paxil and Seroquel for mood    Updated son and daughter over the phone on 06/07/18 -Awaiting insurance authorization for placement.   DISCHARGE CONDITIONS:   Guarded  CONSULTS OBTAINED:   None  DRUG ALLERGIES:   Allergies  Allergen Reactions  . Latex Itching   DISCHARGE MEDICATIONS:   Allergies as of 06/08/2018      Reactions   Latex Itching      Medication List    TAKE these medications   acetaminophen 325 MG tablet Commonly known as:  TYLENOL Take 2 tablets (650 mg total) by mouth every 6 (six) hours as needed for mild pain (or Fever >/= 101).   albuterol (2.5 MG/3ML) 0.083% nebulizer solution Commonly known as:  PROVENTIL Take 3 mLs (2.5 mg total) by nebulization every 2 (two) hours as needed for wheezing.   amLODipine 2.5 MG tablet Commonly known as:  NORVASC Take 1 tablet (2.5 mg total) by mouth daily. Start taking on:  June 09, 2018   atorvastatin 20 MG tablet Commonly known as:  LIPITOR Take 1 tablet (20 mg total) by mouth daily at 6 PM. What changed:    medication strength  how much to take  how to take this  when to take this  additional instructions   gabapentin 100 MG capsule Commonly known as:  NEURONTIN Take 100 mg by mouth 3 (three) times daily.    glimepiride 4 MG tablet Commonly known as:  AMARYL take 1 tablet by mouth WITH BREAKFAST.   lisinopril 20 MG tablet Commonly known as:  ZESTRIL Take 1 tablet (20 mg total) by mouth daily.   metFORMIN 500 MG tablet Commonly known as:  GLUCOPHAGE TAKE 1 TABLET BY MOUTH TWICE A DAY   pantoprazole 40 MG tablet Commonly known as:  PROTONIX Take 1 tablet (40 mg total) by mouth daily.   PARoxetine 40 MG tablet Commonly known as:  PAXIL Take 1 tablet (40 mg total) by mouth daily.   polyethylene glycol 17 g packet Commonly known as:  MIRALAX / GLYCOLAX Take 17 g by mouth daily as needed for mild constipation.   QUEtiapine 100 MG tablet Commonly known as:  SEROQUEL Take 100 mg by mouth at bedtime.   tiotropium 18 MCG inhalation capsule Commonly known as:  SPIRIVA Place 18 mcg as needed into inhaler and inhale.        DISCHARGE INSTRUCTIONS:   1. PCP f/u in 1-2 weeks  DIET:   Cardiac diet  ACTIVITY:   Activity as tolerated  OXYGEN:   Home Oxygen: No.  Oxygen Delivery: room air  DISCHARGE LOCATION:   nursing home   If you experience worsening of your admission symptoms, develop shortness of breath, life threatening emergency, suicidal or homicidal thoughts you must seek medical attention immediately by calling 911 or calling your MD immediately  if symptoms less severe.  You Must read complete instructions/literature along with all the possible adverse reactions/side effects for all the Medicines you take and that have been prescribed to you. Take any new Medicines after you have completely understood and accpet all the possible adverse reactions/side effects.   Please note  You were cared for by a hospitalist during your hospital stay. If you have any questions about your discharge medications or the care you received while you were in the hospital after you are discharged, you can call the unit and asked to speak with the hospitalist on call if the hospitalist  that took care of you is not available. Once you are discharged, your primary care physician will handle any further medical issues. Please note that NO REFILLS for any discharge medications will be authorized once you are discharged, as it is imperative that you return to your primary care physician (or establish a relationship with a primary care physician if you do not have one) for your aftercare needs so that they can reassess your need for medications and monitor your lab values.    On the day of Discharge:  VITAL SIGNS:   Blood pressure 125/77, pulse 65, temperature 98 F (36.7 C), temperature source Axillary, resp. rate 18, height 5\' 9"  (1.753 m), weight 90.7 kg, SpO2 98 %.  PHYSICAL EXAMINATION:     GENERAL:  85 y.o.-year-old elderly patient lying in the bed with no acute distress.  EYES: Pupils equal, round, reactive to light and accommodation. No scleral icterus. Extraocular muscles intact.  HEENT:  Head atraumatic, normocephalic. Oropharynx and nasopharynx clear.  NECK:  Supple, no jugular venous distention. No thyroid enlargement, no tenderness.  LUNGS: Normal breath sounds bilaterally, no wheezing, rales,rhonchi or crepitation. No use of accessory muscles of respiration.  Decreased bibasilar breath sounds CARDIOVASCULAR: S1, S2 normal. No  rubs, or gallops. ,  2/6 systolic murmur is present ABDOMEN: Soft, nontender, nondistended. Bowel sounds present. No organomegaly or mass.  EXTREMITIES: No pedal edema, cyanosis, or clubbing.  Extensive tremors of both upper extremities noted.  Tremors of the lips noted as well NEUROLOGIC: Cranial nerves II through XII are intact. Muscle strength equal in all extremities.  Extreme upper extremity tremors noted.  Sensation intact. Gait not checked.  PSYCHIATRIC: The patient is alert but disoriented SKIN: No obvious rash, lesion, or ulcer.  Ecchymosis noted on both upper extremities.  DATA REVIEW:   CBC Recent Labs  Lab 06/08/18 0553  WBC  9.6  HGB 12.4*  HCT 36.7*  PLT 188    Chemistries  Recent Labs  Lab 06/06/18 1501  06/08/18 0553  NA 142   < > 141  K 3.5   < > 3.6  CL 107   < > 108  CO2 28   < > 25  GLUCOSE 227*   < > 172*  BUN 32*   < > 24*  CREATININE 0.93   < > 0.96  CALCIUM 9.2   < > 8.6*  AST 18  --   --   ALT 20  --   --   ALKPHOS 76  --   --   BILITOT 0.6  --   --    < > = values in this interval not displayed.     Microbiology Results  Results for orders placed or performed during the hospital encounter of 06/06/18  SARS Coronavirus 2 (CEPHEID- Performed in [facility] hospital lab), Hosp Order     Status: None   Collection Time: 06/06/18  3:24 PM  Result Value Ref Range Status   SARS Coronavirus 2 NEGATIVE NEGATIVE Final    Comment: (NOTE) If result is NEGATIVE SARS-CoV-2 target nucleic acids are NOT DETECTED. The SARS-CoV-2 RNA is generally detectable in upper and lower  respiratory specimens during the acute phase of infection. The lowest  concentration of SARS-CoV-2 viral copies this assay can detect is 250  copies / mL. A negative result does not preclude SARS-CoV-2 infection  and should not be used as the sole basis for treatment or other  patient management decisions.  A negative result may occur with  improper specimen collection / handling, submission of specimen other  than nasopharyngeal swab, presence of viral mutation(s) within the  areas targeted by this assay, and inadequate number of viral copies  (<250 copies / mL). A negative result must be combined with clinical  observations, patient history, and epidemiological information. If result is POSITIVE SARS-CoV-2 target nucleic acids are DETECTED. The SARS-CoV-2 RNA is generally detectable in upper and lower  respiratory specimens dur ing the acute phase of infection.  Positive  results are indicative of active infection with SARS-CoV-2.  Clinical  correlation with patient history and other diagnostic information is   necessary to determine patient infection status.  Positive results do  not rule out bacterial infection or co-infection with other viruses. If result is PRESUMPTIVE POSTIVE SARS-CoV-2 nucleic acids MAY BE PRESENT.   A presumptive positive result was obtained on the submitted specimen  and confirmed on repeat testing.  While 2019 novel coronavirus  (  SARS-CoV-2) nucleic acids may be present in the submitted sample  additional confirmatory testing may be necessary for epidemiological  and / or clinical management purposes  to differentiate between  SARS-CoV-2 and other Sarbecovirus currently known to infect humans.  If clinically indicated additional testing with an alternate test  methodology [id number]) is advised. The SARS-CoV-2 RNA is generally  detectable in upper and lower respiratory sp ecimens during the acute  phase of infection. The expected result is Negative. Fact Sheet for Patients:  StrictlyIdeas.no Fact Sheet for Healthcare Providers: BankingDealers.co.za This test is not yet approved or cleared by the [place] FDA and has been authorized for detection and/or diagnosis of SARS-CoV-2 by FDA under an Emergency Use Authorization (EUA).  This EUA will remain in effect (meaning this test can be used) for the duration of the COVID-19 declaration under Section 564(b)(1) of the Act, 21 U.S.C. section 360bbb-3(b)(1), unless the authorization is terminated or revoked sooner. Performed at [facility], [address] [place], [place] [address]   Culture, blood (routine x 2)     Status: None (Preliminary result)   Collection Time: 06/06/18  3:45 PM  Result Value Ref Range Status   Specimen Description BLOOD BLOOD RIGHT FOREARM  Final   Special Requests   Final    BOTTLES DRAWN AEROBIC AND ANAEROBIC Blood Culture adequate volume   Culture   Final    NO GROWTH 2 DAYS Performed at [facility], [address] [place], [place] [address]    Report Status PENDING  Incomplete  Culture, blood (routine x 2)     Status: None (Preliminary result)   Collection Time: 06/06/18  4:00 PM  Result Value Ref Range Status   Specimen Description BLOOD LEFT ANTECUBITAL  Final   Special Requests   Final    BOTTLES DRAWN AEROBIC AND ANAEROBIC Blood Culture results may not be optimal due to an excessive volume of blood received in culture bottles   Culture   Final    NO GROWTH 2 DAYS Performed at [facility], [address], [place], [place] [address]    Report Status PENDING  Incomplete    RADIOLOGY:  No results found.   Management plans discussed with the patient, family and they are in agreement.  CODE STATUS:     Code Status Orders  (From admission, onward)         Start     Ordered   06/06/18 2014  Do not attempt resuscitation (DNR)  Continuous    Question Answer Comment  In the event of cardiac or respiratory ARREST Do not call a "code blue"   In the event of cardiac or respiratory ARREST Do not perform Intubation, CPR, defibrillation or ACLS   In the event of cardiac or respiratory ARREST Use medication by any route, position, wound care, and other measures to relive pain and suffering. May use oxygen, suction and manual treatment of airway obstruction as needed for comfort.      06/06/18 2014        Code Status History    Date Active Date Inactive Code Status Order ID Comments User Context   08/25/2016 0304 08/26/2016 2304 Full Code [id number]  Harrie Foreman, MD Inpatient   07/21/2016 0049 07/25/2016 2010 Full Code [id number]  Harvie Bridge, DO Inpatient   07/10/2016 0934 07/13/2016 1719 Full Code [id number]  Saundra Shelling, MD Inpatient   06/19/2015 0021 06/23/2015 0047 Full Code [id number]  Alesia Richards, MD ED  06/04/2015 2013 06/08/2015 1833 Full Code [id number]  Loletha Grayer, MD ED      TOTAL TIME TAKING CARE OF THIS PATIENT: 38 minutes.    Gladstone Lighter M.D on  06/08/2018 at 1:17 PM  Between 7am to 6pm - Pager - [phone]  After 6pm go to [email or web address] - [employer] [facility]  Office  [phone]  CC: Primary care physician; Leone Haven, MD   Note: This dictation was prepared with Dragon dictation along with smaller phrase technology. Any transcriptional errors that result from this process are unintentional.

## 2018-06-08 NOTE — Plan of Care (Signed)

## 2018-06-09 DIAGNOSIS — R69 Illness, unspecified: Secondary | ICD-10-CM | POA: Diagnosis not present

## 2018-06-09 DIAGNOSIS — E872 Acidosis: Secondary | ICD-10-CM | POA: Diagnosis not present

## 2018-06-09 DIAGNOSIS — I509 Heart failure, unspecified: Secondary | ICD-10-CM | POA: Diagnosis not present

## 2018-06-09 DIAGNOSIS — R531 Weakness: Secondary | ICD-10-CM | POA: Diagnosis not present

## 2018-06-09 DIAGNOSIS — R0602 Shortness of breath: Secondary | ICD-10-CM | POA: Diagnosis not present

## 2018-06-09 LAB — GLUCOSE, CAPILLARY
Glucose-Capillary: 143 mg/dL — ABNORMAL HIGH (ref 70–99)
Glucose-Capillary: 205 mg/dL — ABNORMAL HIGH (ref 70–99)
Glucose-Capillary: 147 mg/dL — ABNORMAL HIGH (ref 70–99)
Glucose-Capillary: 163 mg/dL — ABNORMAL HIGH (ref 70–99)

## 2018-06-09 MED ORDER — LORAZEPAM 2 MG/ML IJ SOLN
1.0000 mg | Freq: Once | INTRAMUSCULAR | Status: DC
  Filled 2018-06-09: qty 1

## 2018-06-09 NOTE — Plan of Care (Signed)
  Problem: Skin Integrity: Goal: Risk for impaired skin integrity will decrease Outcome: Progressing   

## 2018-06-09 NOTE — Progress Notes (Addendum)
[facility] at [place] NAME: Chase Simmons    MR#:  [id number]  DATE OF BIRTH:  04/26/1933  SUBJECTIVE:   -Patient is confused due to underlying dementia.  Extremely weak  REVIEW OF SYSTEMS:  Review of Systems  Unable to perform ROS: Dementia    DRUG ALLERGIES:   Allergies  Allergen Reactions  . Latex Itching    VITALS:  Blood pressure (!) 144/71, pulse 80, temperature 98.3 F (36.8 C), resp. rate 19, height 5\' 9"  (1.753 m), weight 90.7 kg, SpO2 95 %.  PHYSICAL EXAMINATION:  Physical Exam   GENERAL:  85 y.o.-year-old elderly patient lying in the bed with no acute distress. obese EYES: Pupils equal, round, reactive to light and accommodation. No scleral icterus. Extraocular muscles intact.  HEENT: Head atraumatic, normocephalic. Oropharynx and nasopharynx clear.  NECK:  Supple, no jugular venous distention. No thyroid enlargement, no tenderness.  LUNGS: Normal breath sounds bilaterally, no wheezing, rales,rhonchi or crepitation. No use of accessory muscles of respiration.  Decreased bibasilar breath sounds CARDIOVASCULAR: S1, S2 normal. No  rubs, or gallops. ,  2/6 systolic murmur is present ABDOMEN: Soft, nontender, nondistended. Bowel sounds present. No organomegaly or mass.  EXTREMITIES: No pedal edema, cyanosis, or clubbing.  Extensive tremors of both upper extremities noted.  Tremors of the lips noted as well NEUROLOGIC: Cranial nerves II through XII are intact. Muscle strength equal in all extremities.  Extreme upper extremity tremors noted.  Sensation intact. Gait not checked.  PSYCHIATRIC: The patient is alert but disoriented SKIN: No obvious rash, lesion, or ulcer.  Ecchymosis noted on both upper extremities.   LABORATORY PANEL:   CBC Recent Labs  Lab 06/08/18 0553  WBC 9.6  HGB 12.4*  HCT 36.7*  PLT 188    ------------------------------------------------------------------------------------------------------------------  Chemistries  Recent Labs  Lab 06/06/18 1501  06/08/18 0553  NA 142   < > 141  K 3.5   < > 3.6  CL 107   < > 108  CO2 28   < > 25  GLUCOSE 227*   < > 172*  BUN 32*   < > 24*  CREATININE 0.93   < > 0.96  CALCIUM 9.2   < > 8.6*  AST 18  --   --   ALT 20  --   --   ALKPHOS 76  --   --   BILITOT 0.6  --   --    < > = values in this interval not displayed.   ------------------------------------------------------------------------------------------------------------------  Cardiac Enzymes Recent Labs  Lab 06/06/18 1501  TROPONINI <0.03   ------------------------------------------------------------------------------------------------------------------  RADIOLOGY:  No results found.  EKG:   Orders placed or performed during the hospital encounter of 06/06/18  . EKG 12-Lead  . EKG 12-Lead  . ED EKG  . ED EKG    ASSESSMENT AND PLAN:   85 year old male with past medical history significant for dementia, atrial fibrillation, nonischemic cardiomyopathy with last EF of 35%, COPD not on home oxygen who is wheelchair-bound at baseline presents to hospital secondary to weakness and shortness of breath  1.  Shortness of breath with acute on chronic hypoxia-sats are normal on room air at this time.  Chest x-ray with some chronic bronchitis changes-continue inhalers and as needed nebs. -Negative procalcitonin, so not on antibiotics.  2.Generalized weakness-at baseline wheelchair-bound but able to use handicapped bathroom, extremely weak and unable to get out of wheelchair yesterday.  Also lives at home by himself.  Family concerned about his safety at discharge. -Physical therapy and social worker consult requested. -Patient will need rehab at discharge and then long-term care after that.  3.  Mild lactic acidosis on admission-resolved.  4.  Hypokalemia- replaced   5.  Hypertension-on lisinopril  6.  Diabetes mellitus-on low-dose Amaryl and sliding scale insulin  7.  Dementia-seems to be at baseline, pleasant and disoriented. -On Seroquel for mood  8.  DVT prophylaxis -Lovenox  -Awaiting insurance authorization for placement.    All the records are reviewed and case discussed with Care Management/Social Workerr.   CODE STATUS: DNR  TOTAL TIME TAKING CARE OF THIS PATIENT: 38 minutes.   POSSIBLE D/C IN 1-2 DAYS, DEPENDING ON CLINICAL CONDITION.   Fritzi Mandes M.D on 06/09/2018 at 1:35 PM  Between 7am to 6pm - Pager - [phone]  After 6pm go to [email or web address] - password [email or web address] [facility]  Office  [phone]  CC: Primary care physician; Leone Haven, MD

## 2018-06-09 NOTE — Plan of Care (Signed)

## 2018-06-10 DIAGNOSIS — R69 Illness, unspecified: Secondary | ICD-10-CM | POA: Diagnosis not present

## 2018-06-10 DIAGNOSIS — R0602 Shortness of breath: Secondary | ICD-10-CM | POA: Diagnosis not present

## 2018-06-10 DIAGNOSIS — I509 Heart failure, unspecified: Secondary | ICD-10-CM | POA: Diagnosis not present

## 2018-06-10 DIAGNOSIS — E872 Acidosis: Secondary | ICD-10-CM | POA: Diagnosis not present

## 2018-06-10 DIAGNOSIS — R531 Weakness: Secondary | ICD-10-CM | POA: Diagnosis not present

## 2018-06-10 LAB — GLUCOSE, CAPILLARY
Glucose-Capillary: 143 mg/dL — ABNORMAL HIGH (ref 70–99)
Glucose-Capillary: 145 mg/dL — ABNORMAL HIGH (ref 70–99)
Glucose-Capillary: 192 mg/dL — ABNORMAL HIGH (ref 70–99)
Glucose-Capillary: 142 mg/dL — ABNORMAL HIGH (ref 70–99)

## 2018-06-10 MED ORDER — LORAZEPAM 2 MG/ML IJ SOLN
0.5000 mg | Freq: Two times a day (BID) | INTRAMUSCULAR | Status: DC | PRN
  Administered 2018-06-10 (×2): 0.5 mg via INTRAVENOUS
  Filled 2018-06-10 (×2): qty 1

## 2018-06-10 NOTE — Progress Notes (Signed)
[facility] at [place] NAME: Chase Simmons    MR#:  [id number]  DATE OF BIRTH:  [birth year]  SUBJECTIVE:  patient was agitated confused this morning. Required 0.5 mg of IV Ativan. Currently resting. -Has baseline worsening dementia.  REVIEW OF SYSTEMS:  Review of Systems  Unable to perform ROS: Dementia    DRUG ALLERGIES:   Allergies  Allergen Reactions  . Latex Itching    VITALS:  Blood pressure 135/74, pulse 74, temperature 99 F (37.2 C), temperature source Oral, resp. rate 18, height 5\' 9"  (1.753 m), weight 90.7 kg, SpO2 93 %.  PHYSICAL EXAMINATION:  Physical Exam   GENERAL:  85 y.o.-year-old elderly patient lying in the bed with no acute distress. obese EYES: Pupils equal, round, reactive to light and accommodation. No scleral icterus. Extraocular muscles intact.  HEENT: Head atraumatic, normocephalic. Oropharynx and nasopharynx clear.  NECK:  Supple, no jugular venous distention. No thyroid enlargement, no tenderness.  LUNGS: Normal breath sounds bilaterally, no wheezing, rales,rhonchi or crepitation. No use of accessory muscles of respiration.  Decreased bibasilar breath sounds CARDIOVASCULAR: S1, S2 normal. No  rubs, or gallops. ,  2/6 systolic murmur is present ABDOMEN: Soft, nontender, nondistended. Bowel sounds present. No organomegaly or mass.  EXTREMITIES: No pedal edema, cyanosis, or clubbing.  Extensive tremors of both upper extremities noted.  Tremors of the lips noted as well NEUROLOGIC: sedated PSYCHIATRIC: The patient is sleeping SKIN: No obvious rash, lesion, or ulcer.  Ecchymosis noted on both upper extremities.   LABORATORY PANEL:   CBC Recent Labs  Lab 06/08/18 0553  WBC 9.6  HGB 12.4*  HCT 36.7*  PLT 188   ------------------------------------------------------------------------------------------------------------------  Chemistries  Recent Labs  Lab 06/06/18 1501  06/08/18 0553  NA 142   < >  141  K 3.5   < > 3.6  CL 107   < > 108  CO2 28   < > 25  GLUCOSE 227*   < > 172*  BUN 32*   < > 24*  CREATININE 0.93   < > 0.96  CALCIUM 9.2   < > 8.6*  AST 18  --   --   ALT 20  --   --   ALKPHOS 76  --   --   BILITOT 0.6  --   --    < > = values in this interval not displayed.   ------------------------------------------------------------------------------------------------------------------  Cardiac Enzymes Recent Labs  Lab 06/06/18 1501  TROPONINI <0.03   ------------------------------------------------------------------------------------------------------------------  RADIOLOGY:  No results found.  EKG:   Orders placed or performed during the hospital encounter of 06/06/18  . EKG 12-Lead  . EKG 12-Lead  . ED EKG  . ED EKG    ASSESSMENT AND PLAN:   85 year old male with past medical history significant for dementia, atrial fibrillation, nonischemic cardiomyopathy with last EF of 35%, COPD not on home oxygen who is wheelchair-bound at baseline presents to hospital secondary to weakness and shortness of breath  1.  Shortness of breath with acute on chronic hypoxia-sats are normal on room air at this time.  Chest x-ray with some chronic bronchitis changes-continue inhalers and as needed nebs. -Negative procalcitonin, so not on antibiotics.  2.Generalized weakness-at baseline wheelchair-bound but able to use handicapped bathroom, extremely weak and unable to get out of wheelchair yesterday.  Also lives at home by himself.  Family concerned about his safety at discharge. -Physical therapy and social worker consult requested. -Patient will need  rehab at discharge and then long-term care after that.  3.  Mild lactic acidosis on admission-resolved.  4.  Hypokalemia- replaced  5.  Hypertension-on lisinopril  6.  Diabetes mellitus-on low-dose Amaryl and sliding scale insulin  7.  Dementia-seems to be at baseline, pleasant and disoriented. -On Seroquel for mood -prn  ativan  8.  DVT prophylaxis -Lovenox  -Awaiting insurance authorization for placement. Spoke with daughter [name] on the phone and updated.   CODE STATUS: DNR  TOTAL TIME TAKING CARE OF THIS PATIENT: 32 minutes.   POSSIBLE D/C IN 1-2 DAYS, DEPENDING ON CLINICAL CONDITION.   Fritzi Mandes M.D on 06/10/2018 at 2:30 PM  Between 7am to 6pm - Pager - [phone]  After 6pm go to [email or web address] - password [email or web address] [facility]  Office  [phone]  CC: Primary care physician; Leone Haven, MD

## 2018-06-10 NOTE — Plan of Care (Signed)
  Problem: Education: Goal: Knowledge of General Education information will improve Description Including pain rating scale, medication(s)/side effects and non-pharmacologic comfort measures Outcome: Not Progressing   Problem: Health Behavior/Discharge Planning: Goal: Ability to manage health-related needs will improve Outcome: Not Progressing   Problem: Clinical Measurements: Goal: Ability to maintain clinical measurements within normal limits will improve Outcome: Not Progressing   Problem: Skin Integrity: Goal: Risk for impaired skin integrity will decrease Outcome: Not Progressing

## 2018-06-10 NOTE — Progress Notes (Signed)
Pt very agitated and restless this morning. Pt not keeping his clothes on and is trying to get out of bed. Unable to distract/console/reorient pt. Telemetry monitor on his back but still will not stay on due to all of his movement. MD aware and orders were placed for 0.5mg  ativan IV BID PRN and to discontinue telemetry. Will give PRN ativan and take telemetry monitor off pt and continue to monitor.

## 2018-06-10 NOTE — Progress Notes (Signed)
This RN attempted to call daughter, Sharla Kidney with an update but got no answer. Dr. Posey Pronto said she would try to call and update daughter today as well.

## 2018-06-10 NOTE — Progress Notes (Signed)
Notified NP of pt becoming more agitated and trying to get out of bed, taking his clothes off, taking telemetry off, not easily redirected. NP notified. Orders placed. Will continue to monitor and assess.

## 2018-06-11 DIAGNOSIS — I509 Heart failure, unspecified: Secondary | ICD-10-CM | POA: Diagnosis not present

## 2018-06-11 DIAGNOSIS — R69 Illness, unspecified: Secondary | ICD-10-CM | POA: Diagnosis not present

## 2018-06-11 DIAGNOSIS — R0602 Shortness of breath: Secondary | ICD-10-CM | POA: Diagnosis not present

## 2018-06-11 DIAGNOSIS — R531 Weakness: Secondary | ICD-10-CM | POA: Diagnosis not present

## 2018-06-11 DIAGNOSIS — E872 Acidosis: Secondary | ICD-10-CM | POA: Diagnosis not present

## 2018-06-11 LAB — GLUCOSE, CAPILLARY
Glucose-Capillary: 134 mg/dL — ABNORMAL HIGH (ref 70–99)
Glucose-Capillary: 128 mg/dL — ABNORMAL HIGH (ref 70–99)
Glucose-Capillary: 179 mg/dL — ABNORMAL HIGH (ref 70–99)
Glucose-Capillary: 146 mg/dL — ABNORMAL HIGH (ref 70–99)

## 2018-06-11 LAB — CULTURE, BLOOD (ROUTINE X 2)
Culture: NO GROWTH
Culture: NO GROWTH
Special Requests: ADEQUATE

## 2018-06-11 NOTE — Progress Notes (Signed)
Physical Therapy Treatment Patient Details Name: Chase Simmons MRN: [id number] DOB: Dec 23, 1933 Today's Date: 06/11/2018    History of Present Illness Patient is an 85 y/o male that presents with shortness of breath and progressive weakness. He was in a wheelchair at an outpatient MD office and noted O2 sats dropped to 71%. He has also fallen and hit his head recently, CT scan was negative.     PT Comments    Patient with improved participation this date; able to complete full sit/stand and bed/chair with RW, mod assist +2 for lift off, balance control and overall weight shift.  Very fearful/hesitant of movement or weight shift that he does not initiate independently.  Optimal performance for all functional tasks when allowed increased time to initiate task indep. Does maintain very forward flexed posture at trunk, hips and knees; reports increased bilat knee soreness with WBing.  Will plan to assess/progress additional mobility next session. Of note, significant bilat UE resting tremors noted with self-feeding (set up end of session); OT consult placed to address. May benefit from weighted-utensils?    Follow Up Recommendations  SNF     Equipment Recommendations  Rolling walker with 5" wheels;Wheelchair (measurements PT);Wheelchair cushion (measurements PT);3in1 (PT)    Recommendations for Other Services OT consult     Precautions / Restrictions Precautions Precautions: Fall Restrictions Weight Bearing Restrictions: No    Mobility  Bed Mobility Overal bed mobility: Needs Assistance Bed Mobility: Supine to Sit     Supine to sit: Min assist;Mod assist;+2 for physical assistance     General bed mobility comments: hand-over-hand to initiate and guide movement; improved active assist   Transfers Overall transfer level: Needs assistance Equipment used: Rolling walker (2 wheeled) Transfers: Sit to/from Stand Sit to Stand: Min assist;Mod assist;+2 physical assistance         General transfer comment: able to initiate lift off from bed surface, bilat UEs on RW (to help orient to task); increased time to attempt task, optimal performance when patient allowed to self-initiate task  Ambulation/Gait Ambulation/Gait assistance: Mod assist;+2 physical assistance Gait Distance (Feet): 5 Feet Assistive device: Rolling walker (2 wheeled)       General Gait Details: very short, shuffling steps; heavy WBing bilat UEs; poor balance reactions, very reactive to any weight shift outside immediate BOS.  Constant cuing/assist to initiate and direct movement   Stairs             Wheelchair Mobility    Modified Rankin (Stroke Patients Only)       Balance Overall balance assessment: Needs assistance Sitting-balance support: No upper extremity supported;Feet supported Sitting balance-Leahy Scale: Fair     Standing balance support: Bilateral upper extremity supported Standing balance-Leahy Scale: Poor                              Cognition Arousal/Alertness: Awake/alert Behavior During Therapy: WFL for tasks assessed/performed Overall Cognitive Status: No family/caregiver present to determine baseline cognitive functioning                                        Exercises Other Exercises Other Exercises: Rolling bilat, mod/max assist +2 to initiate/complete movement; very fearful, startles with movement that he doesn't indep initiate.  Dep assist +2 for hygiene after incontinent bowel/bladder episode (patient unaware); dep assist for clothing and linen change. Other Exercises:  Sit/stand x2 with RW, mod assist +2--optimal performance when allowed to self-initiate task; maintains significant forward flexion of trunk, hips and knees.  Reports pain/soreness to bilat knees with WBing attempts. Other Exercises: Set up for meal--mod assist to gather/manage utensils; significant UE tremors-may benefit from weighted utentils.  OT consult  placed    General Comments        Pertinent Vitals/Pain Pain Assessment: Faces Faces Pain Scale: No hurt    Home Living                      Prior Function            PT Goals (current goals can now be found in the care plan section) Acute Rehab PT Goals Patient Stated Goal: To return home safely.  PT Goal Formulation: With patient Time For Goal Achievement: 06/21/18 Potential to Achieve Goals: Good Progress towards PT goals: Progressing toward goals    Frequency    Min 2X/week      PT Plan      Co-evaluation              AM-PAC PT "6 Clicks" Mobility   Outcome Measure  Help needed turning from your back to your side while in a flat bed without using bedrails?: A Lot Help needed moving from lying on your back to sitting on the side of a flat bed without using bedrails?: A Lot Help needed moving to and from a bed to a chair (including a wheelchair)?: A Lot Help needed standing up from a chair using your arms (e.g., wheelchair or bedside chair)?: A Lot Help needed to walk in hospital room?: A Lot Help needed climbing 3-5 steps with a railing? : A Lot 6 Click Score: 12    End of Session Equipment Utilized During Treatment: Gait belt Activity Tolerance: Patient tolerated treatment well Patient left: in chair;with call bell/phone within reach;with chair alarm set Nurse Communication: Mobility status PT Visit Diagnosis: Difficulty in walking, not elsewhere classified (R26.2);Muscle weakness (generalized) (M62.81)     Time: 4462-8638 PT Time Calculation (min) (ACUTE ONLY): 43 min  Charges:  $Therapeutic Activity: 38-52 mins                    Dragon Thrush H. Owens Shark, PT, DPT, NCS 06/11/18, 4:08 PM [phone]

## 2018-06-11 NOTE — Progress Notes (Signed)
Chase Simmons the son of patient was phoned and updated about patient. I will continue to assess.

## 2018-06-11 NOTE — Progress Notes (Signed)
[place] at [place] NAME: Chase Simmons    MR#:  [id number]  DATE OF BIRTH:  06-Nov-1933  SUBJECTIVE:   Currently resting. Received ativan last night -Has baseline worsening dementia.  REVIEW OF SYSTEMS:  Review of Systems  Unable to perform ROS: Dementia    DRUG ALLERGIES:   Allergies  Allergen Reactions  . Latex Itching    VITALS:  Blood pressure (!) 161/63, pulse (!) 57, temperature 98.2 F (36.8 C), temperature source Oral, resp. rate 20, height 5\' 9"  (1.753 m), weight 90.7 kg, SpO2 90 %.  PHYSICAL EXAMINATION:  Physical Exam   GENERAL:  85 y.o.-year-old elderly patient lying in the bed with no acute distress. obese EYES: Pupils equal, round, reactive to light and accommodation. No scleral icterus. Extraocular muscles intact.  HEENT: Head atraumatic, normocephalic. Oropharynx and nasopharynx clear.  NECK:  Supple, no jugular venous distention. No thyroid enlargement, no tenderness.  LUNGS: Normal breath sounds bilaterally, no wheezing, rales,rhonchi or crepitation. No use of accessory muscles of respiration.  Decreased bibasilar breath sounds CARDIOVASCULAR: S1, S2 normal. No  rubs, or gallops. ,  2/6 systolic murmur is present ABDOMEN: Soft, nontender, nondistended. Bowel sounds present. No organomegaly or mass.  EXTREMITIES: No pedal edema, cyanosis, or clubbing.  Extensive tremors of both upper extremities noted.  Tremors of the lips noted as well NEUROLOGIC: sedated PSYCHIATRIC: The patient is sleeping SKIN: No obvious rash, lesion, or ulcer.  Ecchymosis noted on both upper extremities.   LABORATORY PANEL:   CBC Recent Labs  Lab 06/08/18 0553  WBC 9.6  HGB 12.4*  HCT 36.7*  PLT 188   ------------------------------------------------------------------------------------------------------------------  Chemistries  Recent Labs  Lab 06/06/18 1501  06/08/18 0553  NA 142   < > 141  K 3.5   < > 3.6  CL 107   < >  108  CO2 28   < > 25  GLUCOSE 227*   < > 172*  BUN 32*   < > 24*  CREATININE 0.93   < > 0.96  CALCIUM 9.2   < > 8.6*  AST 18  --   --   ALT 20  --   --   ALKPHOS 76  --   --   BILITOT 0.6  --   --    < > = values in this interval not displayed.   ------------------------------------------------------------------------------------------------------------------  Cardiac Enzymes Recent Labs  Lab 06/06/18 1501  TROPONINI <0.03   ------------------------------------------------------------------------------------------------------------------  RADIOLOGY:  No results found.  EKG:   Orders placed or performed during the hospital encounter of 06/06/18  . EKG 12-Lead  . EKG 12-Lead  . ED EKG  . ED EKG    ASSESSMENT AND PLAN:   85 year old male with past medical history significant for dementia, atrial fibrillation, nonischemic cardiomyopathy with last EF of 35%, COPD not on home oxygen who is wheelchair-bound at baseline presents to hospital secondary to weakness and shortness of breath  1.  Shortness of breath with acute on chronic hypoxia-sats are normal on room air at this time.  Chest x-ray with some chronic bronchitis changes-continue inhalers and as needed nebs. -Negative procalcitonin, so not on antibiotics.  2.Generalized weakness-at baseline wheelchair-bound but able to use handicapped bathroom, extremely weak and unable to get out of wheelchair yesterday.  Also lives at home by himself.  Family concerned about his safety at discharge. -Physical therapy and social worker consult requested. -Patient will need rehab at discharge and then  long-term care after that.  3.  Mild lactic acidosis on admission-resolved.  4.  Hypokalemia- replaced  5.  Hypertension-on lisinopril  6.  Diabetes mellitus-on low-dose Amaryl and sliding scale insulin  7.  Dementia-seems to be at baseline, pleasant and disoriented. -On Seroquel for mood -prn ativan  8.  DVT prophylaxis  -Lovenox  -Awaiting insurance authorization for placement. Spoke with daughter [name] on the phone and updated yday   CODE STATUS: DNR  TOTAL TIME TAKING CARE OF THIS PATIENT: 25 minutes.   POSSIBLE D/C IN 1-2 DAYS, DEPENDING ON CLINICAL CONDITION.   Fritzi Mandes M.D on 06/11/2018 at 3:19 PM  Between 7am to 6pm - Pager - [phone]  After 6pm go to [email or web address] - password [email or web address] [facility]  Office  [phone]  CC: Primary care physician; Leone Haven, MD

## 2018-06-11 NOTE — TOC Progression Note (Addendum)
Transition of Care [facility]) - Progression Note    Patient Details  Name: Chase Simmons MRN: [id number] Date of Birth: [birth year]  Transition of Care [facility]) CM/SW Contact  Elza Rafter, RN Phone Number: 06/11/2018, 12:32 PM  Clinical Narrative:   [name] to DC-Insurance pending authorization for STR.  Will add avoidable days.      Expected Discharge Plan: Skilled Nursing Facility Barriers to Discharge: Continued Medical Work up  Expected Discharge Plan and Services Expected Discharge Plan: [place] arrangements for the past 2 months: Single Family Home                                       Social Determinants of Health (SDOH) Interventions    Readmission Risk Interventions Readmission Risk Prevention Plan 06/07/2018  Transportation Screening Complete  PCP or Specialist Appt within 3-5 Days Complete  HRI or [place] Complete  Social Work Consult for [place] Planning/Counseling Not Complete  Medication Review Press photographer) Complete  Some recent data might be hidden

## 2018-06-11 NOTE — Plan of Care (Signed)
  Problem: Education: Goal: Knowledge of General Education information will improve Description: Including pain rating scale, medication(s)/side effects and non-pharmacologic comfort measures Outcome: Not Progressing   Problem: Coping: Goal: Level of anxiety will decrease Outcome: Not Progressing   

## 2018-06-11 NOTE — TOC Progression Note (Signed)
Transition of Care [facility]) - Progression Note    Patient Details  Name: Chase Simmons MRN: [id number] Date of Birth: 1933/10/09  Transition of Care [facility]) CM/SW Contact  Ross Ludwig, [place] Phone Number: 06/11/2018, 4:30 PM  Clinical Narrative:     CSW received phone call from patient's daughter [name] asking for an update for placement.  CSW spoke to [place] and informed her that insurance is still pending.  CSW told her that if insurance denies patient, the other options are to pay privately or go home with home health.  She stated that they would have to have him go home with home health, and they can work on trying to place patient from home and applying for Medicaid.   Expected Discharge Plan: Skilled Nursing Facility Barriers to Discharge: Continued Medical Work up  Expected Discharge Plan and Services Expected Discharge Plan: [place] arrangements for the past 2 months: Single Family Home                                       Social Determinants of Health (SDOH) Interventions    Readmission Risk Interventions Readmission Risk Prevention Plan 06/07/2018  Transportation Screening Complete  PCP or Specialist Appt within 3-5 Days Complete  HRI or [place] Complete  Social Work Consult for [place] Planning/Counseling Not Complete  Medication Review Press photographer) Complete  Some recent data might be hidden

## 2018-06-12 DIAGNOSIS — I509 Heart failure, unspecified: Secondary | ICD-10-CM | POA: Diagnosis not present

## 2018-06-12 DIAGNOSIS — R0602 Shortness of breath: Secondary | ICD-10-CM | POA: Diagnosis not present

## 2018-06-12 DIAGNOSIS — R531 Weakness: Secondary | ICD-10-CM | POA: Diagnosis not present

## 2018-06-12 DIAGNOSIS — E872 Acidosis: Secondary | ICD-10-CM | POA: Diagnosis not present

## 2018-06-12 DIAGNOSIS — R69 Illness, unspecified: Secondary | ICD-10-CM | POA: Diagnosis not present

## 2018-06-12 LAB — GLUCOSE, CAPILLARY
Glucose-Capillary: 145 mg/dL — ABNORMAL HIGH (ref 70–99)
Glucose-Capillary: 155 mg/dL — ABNORMAL HIGH (ref 70–99)
Glucose-Capillary: 166 mg/dL — ABNORMAL HIGH (ref 70–99)
Glucose-Capillary: 142 mg/dL — ABNORMAL HIGH (ref 70–99)

## 2018-06-12 LAB — ABO/RH: ABO/RH(D): A POS

## 2018-06-12 LAB — SARS CORONAVIRUS 2 BY RT PCR (HOSPITAL ORDER, PERFORMED IN ~~LOC~~ HOSPITAL LAB): SARS Coronavirus 2: NEGATIVE

## 2018-06-12 MED ORDER — LORAZEPAM 0.5 MG PO TABS
0.5000 mg | ORAL_TABLET | Freq: Once | ORAL | Status: AC
  Administered 2018-06-12: 0.5 mg via ORAL
  Filled 2018-06-12: qty 1

## 2018-06-12 MED ORDER — LORAZEPAM 0.5 MG PO TABS
0.5000 mg | ORAL_TABLET | ORAL | Status: AC
  Administered 2018-06-12: 0.5 mg via ORAL
  Filled 2018-06-12: qty 1

## 2018-06-12 NOTE — Progress Notes (Signed)
Upon receiving report for this pt at 0920 this morning the off going RN Levada Dy informed me that pt had removed second IV in less that a 12 hour time span. I just spoke with MD Fritzi Mandes about replacing IV again and she ordered for pt not to have it replaced at this time. Will continue to monitor.

## 2018-06-12 NOTE — Progress Notes (Signed)
[place] at [place] NAME: Chase Simmons    MR#:  [id number]  DATE OF BIRTH:  01/04/1934  SUBJECTIVE:   more awake today -Has baseline worsening dementia.  REVIEW OF SYSTEMS:  Review of Systems  Unable to perform ROS: Dementia    DRUG ALLERGIES:   Allergies  Allergen Reactions  . Latex Itching    VITALS:  Blood pressure (!) 124/99, pulse 76, temperature 98 F (36.7 C), temperature source Oral, resp. rate 20, height 5\' 9"  (1.753 m), weight 90.7 kg, SpO2 99 %.  PHYSICAL EXAMINATION:  Physical Exam   GENERAL:  85 y.o.-year-old elderly patient lying in the bed with no acute distress. obese EYES: Pupils equal, round, reactive to light and accommodation. No scleral icterus. Extraocular muscles intact.  HEENT: Head atraumatic, normocephalic. Oropharynx and nasopharynx clear.  NECK:  Supple, no jugular venous distention. No thyroid enlargement, no tenderness.  LUNGS: Normal breath sounds bilaterally, no wheezing, rales,rhonchi or crepitation. No use of accessory muscles of respiration.  Decreased bibasilar breath sounds CARDIOVASCULAR: S1, S2 normal. No  rubs, or gallops. ,  2/6 systolic murmur is present ABDOMEN: Soft, nontender, nondistended. Bowel sounds present. No organomegaly or mass.  EXTREMITIES: No pedal edema, cyanosis, or clubbing.  Tremors intermittent NEUROLOGIC: non focal moves all extremities well  PSYCHIATRIC: awake--confused at baseline SKIN: No obvious rash, lesion, or ulcer.  Ecchymosis noted on both upper extremities.   LABORATORY PANEL:   CBC Recent Labs  Lab 06/08/18 0553  WBC 9.6  HGB 12.4*  HCT 36.7*  PLT 188   ------------------------------------------------------------------------------------------------------------------  Chemistries  Recent Labs  Lab 06/06/18 1501  06/08/18 0553  NA 142   < > 141  K 3.5   < > 3.6  CL 107   < > 108  CO2 28   < > 25  GLUCOSE 227*   < > 172*  BUN 32*   < >  24*  CREATININE 0.93   < > 0.96  CALCIUM 9.2   < > 8.6*  AST 18  --   --   ALT 20  --   --   ALKPHOS 76  --   --   BILITOT 0.6  --   --    < > = values in this interval not displayed.   ------------------------------------------------------------------------------------------------------------------  Cardiac Enzymes Recent Labs  Lab 06/06/18 1501  TROPONINI <0.03   ------------------------------------------------------------------------------------------------------------------  RADIOLOGY:  No results found.  EKG:   Orders placed or performed during the hospital encounter of 06/06/18  . EKG 12-Lead  . EKG 12-Lead  . ED EKG  . ED EKG    ASSESSMENT AND PLAN:   85 year old male with past medical history significant for dementia, atrial fibrillation, nonischemic cardiomyopathy with last EF of 35%, COPD not on home oxygen who is wheelchair-bound at baseline presents to hospital secondary to weakness and shortness of breath  1.  Shortness of breath with acute on chronic hypoxia-sats are normal on room air at this time.  Chest x-ray with some chronic bronchitis changes-continue inhalers and as needed nebs. -Negative procalcitonin, so not on antibiotics.  2.Generalized weakness-at baseline wheelchair-bound but able to use handicapped bathroom, extremely weak and unable to get out of wheelchair yesterday.  Also lives at home by himself.  Family concerned about his safety at discharge. -Patient will need rehab at discharge and then long-term care after that.  3.  Mild lactic acidosis on admission-resolved.  4.  Hypokalemia- replaced  5.  Hypertension-on lisinopril  6.  Diabetes mellitus-on low-dose Amaryl and sliding scale insulin  7.  Dementia-seems to be at baseline, pleasant and disoriented. -On Seroquel for mood -prn ativan  8.  DVT prophylaxis -Lovenox  -Awaiting insurance authorization for placement.    CODE STATUS: DNR  TOTAL TIME TAKING CARE OF THIS PATIENT:  25 minutes.   POSSIBLE D/C once insurance approval  Obtained DEPENDING ON CLINICAL CONDITION.   Fritzi Mandes M.D on 06/12/2018 at 3:34 PM  Between 7am to 6pm - Pager - [phone]  After 6pm go to [email or web address] - password [email or web address] [place] [facility]  Office  [phone]  CC: Primary care physician; Leone Haven, MD

## 2018-06-12 NOTE — Progress Notes (Signed)
PT Cancellation Note  Patient Details Name: Chase Simmons MRN: [id number] DOB: October 01, 1933   Cancelled Treatment:    Reason Eval/Treat Not Completed: Fatigue/lethargy limiting ability to participate;Other (comment)(Patient refused PT, states he wants to go to sleep. Compares PT to his time in the [employer]. Not agreeable to participate in bed exercises. Will attempt again at later time/date. )  Janna Arch, PT, DPT   06/12/2018, 11:03 AM

## 2018-06-12 NOTE — Evaluation (Addendum)
Occupational Therapy Evaluation Patient Details Name: Chase Simmons MRN: [id number] DOB: 01-27-1933 Today's Date: 06/12/2018    History of Present Illness Patient is an 85 y/o male that presents with shortness of breath and progressive weakness. He was in a wheelchair at an outpatient MD office and noted O2 sats dropped to 71%. Pt. recently had a fall hitting his head. CT scan was negative.    Clinical Impression   Pt. presents with weakness, BUE tremors, confusion, limited activity tolerance, and limited functional mobility which hinders his ability to complete basic ADL and IADL functioning. Pt. was residing at home alone. Pt. was w/c bound prior to admission, however was able to use the accessible bathroom per pt.'s chart. Pt. was very confused during the session, and was able to follow simple commands inconsistently. Pt. presents with increased tremors in his BUEs when attempting functional tasks. Pt. Required maxA x's 2 for repositioning in bed. Pt. Could benefit from OT services for ADL training, A/E training, and pt./caregiver education about compensatory strategies for tremors during ADLs, positioning, and cognitive compensatory strategies. Pt. would benefit from SNF level of care upon discharge. Pt. Could benefit from follow-up OT services at discharge.   Follow Up Recommendations  SNF    Equipment Recommendations    None recommended   Recommendations for Other Services       Precautions / Restrictions        Mobility Bed Mobility Overal bed mobility: Needs Assistance Bed Mobility: Supine to Sit     Supine to sit: Min assist;Mod assist;+2 for physical assistance Sit to supine: Max assist;+2 for safety/equipment   General bed mobility comments: Per PT report  Transfers Overall transfer level: Needs assistance Equipment used: Rolling walker (2 wheeled) Transfers: Sit to/from Stand Sit to Stand: Min assist;Mod assist;+2 physical assistance         General transfer  comment: Per PT report    Balance                                           ADL either performed or assessed with clinical judgement   ADL Overall ADL's : Needs assistance/impaired Eating/Feeding: Maximal assistance   Grooming: Maximal assistance   Upper Body Bathing: Maximal assistance   Lower Body Bathing: Maximal assistance       Lower Body Dressing: Maximal assistance                       Vision   Vision Assessment?: Vision impaired- to be further tested in functional context     Perception     Praxis      Pertinent Vitals/Pain Pain Assessment: 0-10 Pain Score: 4  Pain Intervention(s): Limited activity within patient's tolerance     Hand Dominance Right   Extremity/Trunk Assessment Upper Extremity Assessment Upper Extremity Assessment: Generalized weakness(Tremors in the BUEs.)           Communication Communication Communication: No difficulties   Cognition Arousal/Alertness: Awake/alert Behavior During Therapy: Increased confusion, command following for simple one step commands. Overall Cognitive Status: No family/caregiver present to determine baseline cognitive functioning                                 General Comments: Garbled speech with intermittent command following observed.    General Comments  Exercises     Shoulder Instructions      Home Living Family/patient expects to be discharged to:: Private residence Living Arrangements: Alone Available Help at Discharge: Family;Friend(s) Type of Home: Independent living facility Home Access: [place]: One level     Bathroom Shower/Tub: Teacher, early years/pre: [place]: Environmental consultant - 2 wheels;Bedside commode;Wheelchair - manual          Prior Functioning/Environment Level of Independence: Independent with assistive device(s)                 OT Problem List:        OT  Treatment/Interventions:      OT Goals(Current goals can be found in the care plan section) Acute Rehab OT Goals OT Goal Formulation: Patient unable to participate in goal setting  OT Frequency: Min 2X/week   Barriers to D/C:            Co-evaluation              AM-PAC OT "6 Clicks" Daily Activity     Outcome Measure Help from another person eating meals?: A Lot Help from another person taking care of personal grooming?: A Lot Help from another person toileting, which includes using toliet, bedpan, or urinal?: Total Help from another person bathing (including washing, rinsing, drying)?: A Lot Help from another person to put on and taking off regular upper body clothing?: A Lot Help from another person to put on and taking off regular lower body clothing?: A Lot 6 Click Score: 11   End of Session    Activity Tolerance: Other (comment)(Confusion) Patient left:    OT Visit Diagnosis: Feeding difficulties (R63.3)                Time: 1540-1605 OT Time Calculation (min): 25 min Charges:  OT General Charges $OT Visit: 1 Visit OT Evaluation $OT Eval Moderate Complexity: 1 Mod  Harrel Carina, MS, OTR/L   Harrel Carina 06/12/2018, 4:30 PM

## 2018-06-13 DIAGNOSIS — R1312 Dysphagia, oropharyngeal phase: Secondary | ICD-10-CM | POA: Diagnosis not present

## 2018-06-13 DIAGNOSIS — R488 Other symbolic dysfunctions: Secondary | ICD-10-CM | POA: Diagnosis not present

## 2018-06-13 DIAGNOSIS — K219 Gastro-esophageal reflux disease without esophagitis: Secondary | ICD-10-CM | POA: Diagnosis not present

## 2018-06-13 DIAGNOSIS — R0602 Shortness of breath: Secondary | ICD-10-CM | POA: Diagnosis not present

## 2018-06-13 DIAGNOSIS — M6281 Muscle weakness (generalized): Secondary | ICD-10-CM | POA: Diagnosis not present

## 2018-06-13 DIAGNOSIS — I509 Heart failure, unspecified: Secondary | ICD-10-CM | POA: Diagnosis not present

## 2018-06-13 DIAGNOSIS — E785 Hyperlipidemia, unspecified: Secondary | ICD-10-CM | POA: Diagnosis not present

## 2018-06-13 DIAGNOSIS — G4700 Insomnia, unspecified: Secondary | ICD-10-CM | POA: Diagnosis not present

## 2018-06-13 DIAGNOSIS — Z7401 Bed confinement status: Secondary | ICD-10-CM | POA: Diagnosis not present

## 2018-06-13 DIAGNOSIS — R531 Weakness: Secondary | ICD-10-CM | POA: Diagnosis not present

## 2018-06-13 DIAGNOSIS — F419 Anxiety disorder, unspecified: Secondary | ICD-10-CM | POA: Diagnosis not present

## 2018-06-13 DIAGNOSIS — D649 Anemia, unspecified: Secondary | ICD-10-CM | POA: Diagnosis not present

## 2018-06-13 DIAGNOSIS — F331 Major depressive disorder, recurrent, moderate: Secondary | ICD-10-CM | POA: Diagnosis not present

## 2018-06-13 DIAGNOSIS — M2550 Pain in unspecified joint: Secondary | ICD-10-CM | POA: Diagnosis not present

## 2018-06-13 DIAGNOSIS — I4891 Unspecified atrial fibrillation: Secondary | ICD-10-CM | POA: Diagnosis not present

## 2018-06-13 DIAGNOSIS — E119 Type 2 diabetes mellitus without complications: Secondary | ICD-10-CM | POA: Diagnosis not present

## 2018-06-13 DIAGNOSIS — F0281 Dementia in other diseases classified elsewhere with behavioral disturbance: Secondary | ICD-10-CM | POA: Diagnosis not present

## 2018-06-13 DIAGNOSIS — E872 Acidosis: Secondary | ICD-10-CM | POA: Diagnosis not present

## 2018-06-13 DIAGNOSIS — N390 Urinary tract infection, site not specified: Secondary | ICD-10-CM | POA: Diagnosis not present

## 2018-06-13 DIAGNOSIS — E1165 Type 2 diabetes mellitus with hyperglycemia: Secondary | ICD-10-CM | POA: Diagnosis not present

## 2018-06-13 DIAGNOSIS — J441 Chronic obstructive pulmonary disease with (acute) exacerbation: Secondary | ICD-10-CM | POA: Diagnosis not present

## 2018-06-13 DIAGNOSIS — R69 Illness, unspecified: Secondary | ICD-10-CM | POA: Diagnosis not present

## 2018-06-13 DIAGNOSIS — R251 Tremor, unspecified: Secondary | ICD-10-CM | POA: Diagnosis not present

## 2018-06-13 DIAGNOSIS — R319 Hematuria, unspecified: Secondary | ICD-10-CM | POA: Diagnosis not present

## 2018-06-13 DIAGNOSIS — J449 Chronic obstructive pulmonary disease, unspecified: Secondary | ICD-10-CM | POA: Diagnosis not present

## 2018-06-13 DIAGNOSIS — I10 Essential (primary) hypertension: Secondary | ICD-10-CM | POA: Diagnosis not present

## 2018-06-13 LAB — CREATININE, SERUM
Creatinine, Ser: 1.04 mg/dL (ref 0.61–1.24)
GFR calc Af Amer: >60 mL/min (ref >60)
GFR calc non Af Amer: >60 mL/min (ref >60)

## 2018-06-13 LAB — GLUCOSE, CAPILLARY
Glucose-Capillary: 156 mg/dL — ABNORMAL HIGH (ref 70–99)
Glucose-Capillary: 208 mg/dL — ABNORMAL HIGH (ref 70–99)

## 2018-06-13 NOTE — Discharge Summary (Signed)
[facility] NAME: Chase Simmons    MR#:  [id number]  DATE OF BIRTH:  03/10/1933  DATE OF ADMISSION:  06/06/2018 ADMITTING PHYSICIAN: Hillary Bow, MD  DATE OF DISCHARGE: 6.10.2020  PRIMARY CARE PHYSICIAN: Leone Haven, MD    ADMISSION DIAGNOSIS:  Weakness [R53.1] Edema leg [R60.0] Elevated lactic acid level [R79.89] Fall, initial encounter [W19.XXXA]  DISCHARGE DIAGNOSIS:  Advance Dementia Shortness of breath--resolved  SECONDARY DIAGNOSIS:   Past Medical History:  Diagnosis Date  . Allergy   . Bladder cancer ([place])    a. followed by Dr. Jacqlyn Larsen  . Chronic combined systolic (congestive) and diastolic (congestive) heart failure ([place])    a. 07/2016 Echo: EF 30-35%, sev mid-apicalanteroseptal, ant, inf HK, Gr1 DD.  Marland Kitchen COPD (chronic obstructive pulmonary disease) ([place])   . Hyperlipidemia   . Hypertension   . NICM (nonischemic cardiomyopathy) ([place])    a. 07/2016 Echo: EF 30-35%, sev mid-apicalanteroseptal, ant, inf HK, Gr1 DD, mild MR, mildly dil LA, PASP 75mmHg - ? stress induced CM.  Marland Kitchen Nonobstructive Coronary atherosclerosis    a. 07/2016 Cath: LM nl, LAD nl, D1 40ost, LCX 37m, RCA nl, EF 25-30%.  Marland Kitchen PAF (paroxysmal atrial fibrillation) ([place])    a. Dx 08/2013. CHA2DS2VASc = 6-->no OAC 2/2 h/o falls and nocompliance.  . Tremor, essential    sees [facility] neurologist  . Type 2 diabetes mellitus with complication, without long-term current use of insulin ([place]) 08/01/2012  . Urinary incontinence     HOSPITAL COURSE:   85 year old male with past medical history significant for dementia, atrial fibrillation, nonischemic cardiomyopathy with last EF of 35%, COPD not on home oxygen who is wheelchair-bound at baseline presents to hospital secondary to weakness and shortness of breath  1.  Shortness of breath with acute on chronic hypoxia-sats are normal on room air at this time.  Chest x-ray with some chronic bronchitis  changes-continue inhalers and as needed nebs.  2.Generalized weakness-at baseline wheelchair-bound but able to use handicapped bathroom, extremely weak and unable to get out of wheelchair yesterday.  Also lives at home by himself.  Family concerned about his safety at discharge. -Patient will need rehab at discharge and then long-term care after that.  3.  Mild lactic acidosis on admission-resolved.  4.  Hypokalemia- replaced  5.  Hypertension-on lisinopril  6.  Diabetes mellitus-on low-dose Amaryl and sliding scale insulin  7.  Dementia-seems to be at baseline, pleasant and disoriented. -On Seroquel for mood -prn ativan  8.  DVT prophylaxis -Lovenox  Pt will d/c to [facility] today. Overall at baseline CONSULTS OBTAINED:    DRUG ALLERGIES:   Allergies  Allergen Reactions  . Latex Itching    DISCHARGE MEDICATIONS:   Allergies as of 06/13/2018      Reactions   Latex Itching      Medication List    TAKE these medications   acetaminophen 325 MG tablet Commonly known as:  TYLENOL Take 2 tablets (650 mg total) by mouth every 6 (six) hours as needed for mild pain (or Fever >/= 101).   albuterol (2.5 MG/3ML) 0.083% nebulizer solution Commonly known as:  PROVENTIL Take 3 mLs (2.5 mg total) by nebulization every 2 (two) hours as needed for wheezing.   amLODipine 2.5 MG tablet Commonly known as:  NORVASC Take 1 tablet (2.5 mg total) by mouth daily.   atorvastatin 20 MG tablet Commonly known as:  LIPITOR Take 1 tablet (20 mg total) by mouth  daily at 6 PM. What changed:    medication strength  how much to take  how to take this  when to take this  additional instructions   gabapentin 100 MG capsule Commonly known as:  NEURONTIN Take 100 mg by mouth 3 (three) times daily.   glimepiride 4 MG tablet Commonly known as:  AMARYL take 1 tablet by mouth WITH BREAKFAST.   lisinopril 20 MG tablet Commonly known as:  ZESTRIL Take 1 tablet (20 mg total) by mouth  daily.   metFORMIN 500 MG tablet Commonly known as:  GLUCOPHAGE TAKE 1 TABLET BY MOUTH TWICE A DAY   pantoprazole 40 MG tablet Commonly known as:  PROTONIX Take 1 tablet (40 mg total) by mouth daily.   PARoxetine 40 MG tablet Commonly known as:  PAXIL Take 1 tablet (40 mg total) by mouth daily.   polyethylene glycol 17 g packet Commonly known as:  MIRALAX / GLYCOLAX Take 17 g by mouth daily as needed for mild constipation.   QUEtiapine 100 MG tablet Commonly known as:  SEROQUEL Take 100 mg by mouth at bedtime.   tiotropium 18 MCG inhalation capsule Commonly known as:  SPIRIVA Place 18 mcg as needed into inhaler and inhale.       If you experience worsening of your admission symptoms, develop shortness of breath, life threatening emergency, suicidal or homicidal thoughts you must seek medical attention immediately by calling 911 or calling your MD immediately  if symptoms less severe.  You Must read complete instructions/literature along with all the possible adverse reactions/side effects for all the Medicines you take and that have been prescribed to you. Take any new Medicines after you have completely understood and accept all the possible adverse reactions/side effects.   Please note  You were cared for by a hospitalist during your hospital stay. If you have any questions about your discharge medications or the care you received while you were in the hospital after you are discharged, you can call the unit and asked to speak with the hospitalist on call if the hospitalist that took care of you is not available. Once you are discharged, your primary care physician will handle any further medical issues. Please note that NO REFILLS for any discharge medications will be authorized once you are discharged, as it is imperative that you return to your primary care physician (or establish a relationship with a primary care physician if you do not have one) for your aftercare needs so  that they can reassess your need for medications and monitor your lab values. Today   SUBJECTIVE   Now new complaints. Sleepy this am  VITAL SIGNS:  Blood pressure (!) 135/44, pulse (!) 57, temperature 98 F (36.7 C), resp. rate 19, height 5\' 9"  (1.753 m), weight 90.7 kg, SpO2 97 %.  I/O:    Intake/Output Summary (Last 24 hours) at 06/13/2018 0908 Last data filed at 06/12/2018 1040 Gross per 24 hour  Intake 240 ml  Output -  Net 240 ml    PHYSICAL EXAMINATION:  GENERAL:  85 y.o.-year-old patient lying in the bed with no acute distress. obeseEYES: Pupils equal, round, reactive to light and accommodation. No scleral icterus. Extraocular muscles intact.  HEENT: Head atraumatic, normocephalic. Oropharynx and nasopharynx clear.  NECK:  Supple, no jugular venous distention. No thyroid enlargement, no tenderness.  LUNGS: Normal breath sounds bilaterally, no wheezing, rales,rhonchi or crepitation. No use of accessory muscles of respiration.  CARDIOVASCULAR: S1, S2 normal. No murmurs, rubs, or gallops.  ABDOMEN: Soft, non-tender, non-distended. Bowel sounds present. No organomegaly or mass.  EXTREMITIES: No pedal edema, cyanosis, or clubbing.  NEUROLOGIC: limited--grossly non focal PSYCHIATRIC:advance dementia , awakens on VC SKIN: No obvious rash, lesion, or ulcer per RN documentation  DATA REVIEW:   CBC  Recent Labs  Lab 06/08/18 0553  WBC 9.6  HGB 12.4*  HCT 36.7*  PLT 188    Chemistries  Recent Labs  Lab 06/06/18 1501  06/08/18 0553 06/13/18 0451  NA 142   < > 141  --   K 3.5   < > 3.6  --   CL 107   < > 108  --   CO2 28   < > 25  --   GLUCOSE 227*   < > 172*  --   BUN 32*   < > 24*  --   CREATININE 0.93   < > 0.96 1.04  CALCIUM 9.2   < > 8.6*  --   AST 18  --   --   --   ALT 20  --   --   --   ALKPHOS 76  --   --   --   BILITOT 0.6  --   --   --    < > = values in this interval not displayed.    Microbiology Results   Recent Results (from the past 240  hour(s))  SARS Coronavirus 2 (CEPHEID- Performed in [facility] hospital lab), Hosp Order     Status: None   Collection Time: 06/06/18  3:24 PM  Result Value Ref Range Status   SARS Coronavirus 2 NEGATIVE NEGATIVE Final    Comment: (NOTE) If result is NEGATIVE SARS-CoV-2 target nucleic acids are NOT DETECTED. The SARS-CoV-2 RNA is generally detectable in upper and lower  respiratory specimens during the acute phase of infection. The lowest  concentration of SARS-CoV-2 viral copies this assay can detect is 250  copies / mL. A negative result does not preclude SARS-CoV-2 infection  and should not be used as the sole basis for treatment or other  patient management decisions.  A negative result may occur with  improper specimen collection / handling, submission of specimen other  than nasopharyngeal swab, presence of viral mutation(s) within the  areas targeted by this assay, and inadequate number of viral copies  (<250 copies / mL). A negative result must be combined with clinical  observations, patient history, and epidemiological information. If result is POSITIVE SARS-CoV-2 target nucleic acids are DETECTED. The SARS-CoV-2 RNA is generally detectable in upper and lower  respiratory specimens dur ing the acute phase of infection.  Positive  results are indicative of active infection with SARS-CoV-2.  Clinical  correlation with patient history and other diagnostic information is  necessary to determine patient infection status.  Positive results do  not rule out bacterial infection or co-infection with other viruses. If result is PRESUMPTIVE POSTIVE SARS-CoV-2 nucleic acids MAY BE PRESENT.   A presumptive positive result was obtained on the submitted specimen  and confirmed on repeat testing.  While 2019 novel coronavirus  (SARS-CoV-2) nucleic acids may be present in the submitted sample  additional confirmatory testing may be necessary for epidemiological  and / or clinical  management purposes  to differentiate between  SARS-CoV-2 and other Sarbecovirus currently known to infect humans.  If clinically indicated additional testing with an alternate test  methodology [phone]) is advised. The SARS-CoV-2 RNA is generally  detectable in upper and lower respiratory sp ecimens  during the acute  phase of infection. The expected result is Negative. Fact Sheet for Patients:  StrictlyIdeas.no Fact Sheet for Healthcare Providers: BankingDealers.co.za This test is not yet approved or cleared by the [place] FDA and has been authorized for detection and/or diagnosis of SARS-CoV-2 by FDA under an Emergency Use Authorization (EUA).  This EUA will remain in effect (meaning this test can be used) for the duration of the COVID-19 declaration under Section 564(b)(1) of the Act, 21 U.S.C. section 360bbb-3(b)(1), unless the authorization is terminated or revoked sooner. Performed at [facility], [address] [place], [place] [address]   Culture, blood (routine x 2)     Status: None   Collection Time: 06/06/18  3:45 PM  Result Value Ref Range Status   Specimen Description BLOOD BLOOD RIGHT FOREARM  Final   Special Requests   Final    BOTTLES DRAWN AEROBIC AND ANAEROBIC Blood Culture adequate volume   Culture   Final    NO GROWTH 5 DAYS Performed at [facility], [address] [place], [place] [address]    Report Status 06/11/2018 FINAL  Final  Culture, blood (routine x 2)     Status: None   Collection Time: 06/06/18  4:00 PM  Result Value Ref Range Status   Specimen Description BLOOD LEFT ANTECUBITAL  Final   Special Requests   Final    BOTTLES DRAWN AEROBIC AND ANAEROBIC Blood Culture results may not be optimal due to an excessive volume of blood received in culture bottles   Culture   Final    NO GROWTH 5 DAYS Performed at [facility], [address] [place], [place] [address]     Report Status 06/11/2018 FINAL  Final  SARS Coronavirus 2 [facility] order, Performed in [facility] hospital lab)     Status: None   Collection Time: 06/12/18  4:21 PM  Result Value Ref Range Status   SARS Coronavirus 2 NEGATIVE NEGATIVE Final    Comment: (NOTE) If result is NEGATIVE SARS-CoV-2 target nucleic acids are NOT DETECTED. The SARS-CoV-2 RNA is generally detectable in upper and lower  respiratory specimens during the acute phase of infection. The lowest  concentration of SARS-CoV-2 viral copies this assay can detect is 250  copies / mL. A negative result does not preclude SARS-CoV-2 infection  and should not be used as the sole basis for treatment or other  patient management decisions.  A negative result may occur with  improper specimen collection / handling, submission of specimen other  than nasopharyngeal swab, presence of viral mutation(s) within the  areas targeted by this assay, and inadequate number of viral copies  (<250 copies / mL). A negative result must be combined with clinical  observations, patient history, and epidemiological information. If result is POSITIVE SARS-CoV-2 target nucleic acids are DETECTED. The SARS-CoV-2 RNA is generally detectable in upper and lower  respiratory specimens dur ing the acute phase of infection.  Positive  results are indicative of active infection with SARS-CoV-2.  Clinical  correlation with patient history and other diagnostic information is  necessary to determine patient infection status.  Positive results do  not rule out bacterial infection or co-infection with other viruses. If result is PRESUMPTIVE POSTIVE SARS-CoV-2 nucleic acids MAY BE PRESENT.   A presumptive positive result was obtained on the submitted specimen  and confirmed on repeat testing.  While 2019 novel coronavirus  (SARS-CoV-2) nucleic acids may be present in the submitted sample  additional confirmatory testing  may be necessary for epidemiological   and / or clinical management purposes  to differentiate between  SARS-CoV-2 and other Sarbecovirus currently known to infect humans.  If clinically indicated additional testing with an alternate test  methodology [phone]) is advised. The SARS-CoV-2 RNA is generally  detectable in upper and lower respiratory sp ecimens during the acute  phase of infection. The expected result is Negative. Fact Sheet for Patients:  StrictlyIdeas.no Fact Sheet for Healthcare Providers: BankingDealers.co.za This test is not yet approved or cleared by the [place] FDA and has been authorized for detection and/or diagnosis of SARS-CoV-2 by FDA under an Emergency Use Authorization (EUA).  This EUA will remain in effect (meaning this test can be used) for the duration of the COVID-19 declaration under Section 564(b)(1) of the Act, 21 U.S.C. section 360bbb-3(b)(1), unless the authorization is terminated or revoked sooner. Performed at [facility], [address] [place], [place] [address]     RADIOLOGY:  No results found.   CODE STATUS:     Code Status Orders  (From admission, onward)         Start     Ordered   06/06/18 2014  Do not attempt resuscitation (DNR)  Continuous    Question Answer Comment  In the event of cardiac or respiratory ARREST Do not call a "code blue"   In the event of cardiac or respiratory ARREST Do not perform Intubation, CPR, defibrillation or ACLS   In the event of cardiac or respiratory ARREST Use medication by any route, position, wound care, and other measures to relive pain and suffering. May use oxygen, suction and manual treatment of airway obstruction as needed for comfort.      06/06/18 2014        Code Status History    Date Active Date Inactive Code Status Order ID Comments User Context   08/25/2016 0304 08/26/2016 2304 Full Code [id number]  Harrie Foreman, MD Inpatient   07/21/2016 0049  07/25/2016 2010 Full Code [id number]  Harvie Bridge, DO Inpatient   07/10/2016 0934 07/13/2016 1719 Full Code [id number]  Saundra Shelling, MD Inpatient   06/19/2015 0021 06/23/2015 0047 Full Code [id number]  Alesia Richards, MD ED   06/04/2015 2013 06/08/2015 1833 Full Code [id number]  Loletha Grayer, MD ED      TOTAL TIME TAKING CARE OF THIS PATIENT: *40* minutes.    Fritzi Mandes M.D on 06/13/2018 at 9:08 AM  Between 7am to 6pm - Pager - [phone] After 6pm go to [email or web address] - password [email or web address] [facility]  Office  [phone]  CC: Primary care physician; Leone Haven, MD

## 2018-06-13 NOTE — Progress Notes (Signed)
Pt discharged to [facility] room 802 via EMS at 1300. Pt was alert and oriented to self only. VSS. Daughter, [name], called and updated on pts departure time. Report called to Kim at 1125. All questions answered. AVS and discharge packet given to transport.

## 2018-06-13 NOTE — TOC Transition Note (Signed)
Transition of Care [facility]) - CM/SW Discharge Note   Patient Details  Name: Chase Simmons MRN: [id number] Date of Birth: February 09, 1933  Transition of Care [facility]) CM/SW Contact:  Elza Rafter, RN Phone Number: 06/13/2018, 9:36 AM   Clinical Narrative:   Patient discharging to [facility] today.  Faxed DC summary to [facility].  Discharge packet placed on chart with signed DNR.  Ria Comment, RN aware.  Daughter [name] notified of discharge by CM.      Final next level of care: Skilled Nursing Facility Barriers to Discharge: No Barriers Identified   Patient Goals and CMS Choice        Discharge Placement              Patient chooses bed at: [facility] [place] Patient to be transferred to facility by: EMS Name of family member notified: [name]-daughter Patient and family notified of of transfer: 06/13/18  Discharge Plan and Services                                     Social Determinants of Health (SDOH) Interventions     Readmission Risk Interventions Readmission Risk Prevention Plan 06/07/2018  Transportation Screening Complete  PCP or Specialist Appt within 3-5 Days Complete  HRI or [place] Complete  Social Work Consult for [place] Planning/Counseling Not Complete  Medication Review [employer]) Complete  Some recent data might be hidden

## 2018-06-15 ENCOUNTER — Telehealth: Payer: Self-pay

## 2018-06-15 NOTE — Telephone Encounter (Signed)
Disregard my last message. Patient was discharged to [facility].

## 2018-06-15 NOTE — Telephone Encounter (Signed)
Called patient to follow up with transitional care management. No answer. Left a message on cell phone (daughters phone) listed as preference to return my call, direct dial.  Tentatively scheduled HFU on 06/18/18 at 8:00, will confirm or cancel appointment as appropriate.

## 2018-06-16 DIAGNOSIS — M6281 Muscle weakness (generalized): Secondary | ICD-10-CM | POA: Diagnosis not present

## 2018-06-16 DIAGNOSIS — E785 Hyperlipidemia, unspecified: Secondary | ICD-10-CM | POA: Diagnosis not present

## 2018-06-16 DIAGNOSIS — I10 Essential (primary) hypertension: Secondary | ICD-10-CM | POA: Diagnosis not present

## 2018-06-16 DIAGNOSIS — J449 Chronic obstructive pulmonary disease, unspecified: Secondary | ICD-10-CM | POA: Diagnosis not present

## 2018-06-16 DIAGNOSIS — E119 Type 2 diabetes mellitus without complications: Secondary | ICD-10-CM | POA: Diagnosis not present

## 2018-06-16 DIAGNOSIS — R69 Illness, unspecified: Secondary | ICD-10-CM | POA: Diagnosis not present

## 2018-06-18 ENCOUNTER — Inpatient Hospital Stay: Payer: Medicare HMO | Admitting: Family Medicine

## 2018-06-19 ENCOUNTER — Other Ambulatory Visit
Admission: RE | Admit: 2018-06-19 | Discharge: 2018-06-19 | Disposition: A | Discharge status: Home / Self Care | Payer: Medicare HMO | Source: Ambulatory Visit | Attending: Family Medicine | Admitting: Family Medicine

## 2018-06-19 DIAGNOSIS — R69 Illness, unspecified: Secondary | ICD-10-CM | POA: Diagnosis not present

## 2018-06-19 DIAGNOSIS — J449 Chronic obstructive pulmonary disease, unspecified: Secondary | ICD-10-CM | POA: Diagnosis not present

## 2018-06-19 DIAGNOSIS — I4891 Unspecified atrial fibrillation: Secondary | ICD-10-CM | POA: Diagnosis not present

## 2018-06-19 DIAGNOSIS — F419 Anxiety disorder, unspecified: Secondary | ICD-10-CM | POA: Diagnosis not present

## 2018-06-19 DIAGNOSIS — F0281 Dementia in other diseases classified elsewhere with behavioral disturbance: Secondary | ICD-10-CM | POA: Diagnosis not present

## 2018-06-19 DIAGNOSIS — G4700 Insomnia, unspecified: Secondary | ICD-10-CM | POA: Diagnosis not present

## 2018-06-19 DIAGNOSIS — E1165 Type 2 diabetes mellitus with hyperglycemia: Secondary | ICD-10-CM | POA: Diagnosis not present

## 2018-06-19 DIAGNOSIS — F331 Major depressive disorder, recurrent, moderate: Secondary | ICD-10-CM | POA: Diagnosis not present

## 2018-06-19 DIAGNOSIS — N390 Urinary tract infection, site not specified: Secondary | ICD-10-CM | POA: Insufficient documentation

## 2018-06-19 LAB — URINALYSIS, COMPLETE (UACMP) WITH MICROSCOPIC
Bilirubin Urine: NEGATIVE
Glucose, UA: NEGATIVE mg/dL
Hgb urine dipstick: NEGATIVE
Ketones, ur: NEGATIVE mg/dL
Leukocytes,Ua: NEGATIVE
Nitrite: NEGATIVE
Protein, ur: 30 mg/dL — AB
Specific Gravity, Urine: 1.03 (ref 1.005–1.030)
WBC, UA: NONE SEEN WBC/hpf (ref 0–5)
pH: 5 (ref 5.0–8.0)

## 2018-06-20 ENCOUNTER — Other Ambulatory Visit: Payer: Self-pay | Admitting: Urology

## 2018-06-21 ENCOUNTER — Other Ambulatory Visit: Payer: Self-pay | Admitting: Urology

## 2018-06-21 ENCOUNTER — Ambulatory Visit: Payer: Medicare HMO | Admitting: Internal Medicine

## 2018-06-21 LAB — URINE CULTURE: Culture: 20000 — AB

## 2018-06-24 DIAGNOSIS — E785 Hyperlipidemia, unspecified: Secondary | ICD-10-CM | POA: Diagnosis not present

## 2018-06-24 DIAGNOSIS — M6281 Muscle weakness (generalized): Secondary | ICD-10-CM | POA: Diagnosis not present

## 2018-06-24 DIAGNOSIS — E119 Type 2 diabetes mellitus without complications: Secondary | ICD-10-CM | POA: Diagnosis not present

## 2018-06-24 DIAGNOSIS — I10 Essential (primary) hypertension: Secondary | ICD-10-CM | POA: Diagnosis not present

## 2018-06-24 DIAGNOSIS — J449 Chronic obstructive pulmonary disease, unspecified: Secondary | ICD-10-CM | POA: Diagnosis not present

## 2018-06-24 DIAGNOSIS — R251 Tremor, unspecified: Secondary | ICD-10-CM | POA: Diagnosis not present

## 2018-06-24 DIAGNOSIS — R69 Illness, unspecified: Secondary | ICD-10-CM | POA: Diagnosis not present

## 2018-06-25 DIAGNOSIS — J449 Chronic obstructive pulmonary disease, unspecified: Secondary | ICD-10-CM | POA: Diagnosis not present

## 2018-06-25 DIAGNOSIS — R69 Illness, unspecified: Secondary | ICD-10-CM | POA: Diagnosis not present

## 2018-06-25 DIAGNOSIS — E1165 Type 2 diabetes mellitus with hyperglycemia: Secondary | ICD-10-CM | POA: Diagnosis not present

## 2018-06-25 DIAGNOSIS — I4891 Unspecified atrial fibrillation: Secondary | ICD-10-CM | POA: Diagnosis not present

## 2018-06-25 DIAGNOSIS — G4700 Insomnia, unspecified: Secondary | ICD-10-CM | POA: Diagnosis not present

## 2018-06-30 DIAGNOSIS — N390 Urinary tract infection, site not specified: Secondary | ICD-10-CM | POA: Diagnosis not present

## 2018-06-30 DIAGNOSIS — D649 Anemia, unspecified: Secondary | ICD-10-CM | POA: Diagnosis not present

## 2018-06-30 DIAGNOSIS — R319 Hematuria, unspecified: Secondary | ICD-10-CM | POA: Diagnosis not present

## 2018-07-05 ENCOUNTER — Other Ambulatory Visit: Payer: Self-pay | Admitting: Family Medicine

## 2018-07-05 DIAGNOSIS — F419 Anxiety disorder, unspecified: Secondary | ICD-10-CM

## 2018-07-06 DIAGNOSIS — I10 Essential (primary) hypertension: Secondary | ICD-10-CM | POA: Diagnosis not present

## 2018-07-06 DIAGNOSIS — R251 Tremor, unspecified: Secondary | ICD-10-CM | POA: Diagnosis not present

## 2018-07-06 DIAGNOSIS — N390 Urinary tract infection, site not specified: Secondary | ICD-10-CM | POA: Diagnosis not present

## 2018-07-06 DIAGNOSIS — E785 Hyperlipidemia, unspecified: Secondary | ICD-10-CM | POA: Diagnosis not present

## 2018-07-06 DIAGNOSIS — R69 Illness, unspecified: Secondary | ICD-10-CM | POA: Diagnosis not present

## 2018-07-06 DIAGNOSIS — M6281 Muscle weakness (generalized): Secondary | ICD-10-CM | POA: Diagnosis not present

## 2018-07-06 DIAGNOSIS — E119 Type 2 diabetes mellitus without complications: Secondary | ICD-10-CM | POA: Diagnosis not present

## 2018-07-07 DIAGNOSIS — R319 Hematuria, unspecified: Secondary | ICD-10-CM | POA: Diagnosis not present

## 2018-07-07 DIAGNOSIS — N390 Urinary tract infection, site not specified: Secondary | ICD-10-CM | POA: Diagnosis not present

## 2018-07-09 ENCOUNTER — Other Ambulatory Visit: Payer: Self-pay | Admitting: Family Medicine

## 2018-07-10 DIAGNOSIS — E1165 Type 2 diabetes mellitus with hyperglycemia: Secondary | ICD-10-CM | POA: Diagnosis not present

## 2018-07-10 DIAGNOSIS — R69 Illness, unspecified: Secondary | ICD-10-CM | POA: Diagnosis not present

## 2018-07-10 DIAGNOSIS — G4700 Insomnia, unspecified: Secondary | ICD-10-CM | POA: Diagnosis not present

## 2018-07-10 DIAGNOSIS — J449 Chronic obstructive pulmonary disease, unspecified: Secondary | ICD-10-CM | POA: Diagnosis not present

## 2018-07-10 DIAGNOSIS — I4891 Unspecified atrial fibrillation: Secondary | ICD-10-CM | POA: Diagnosis not present

## 2018-07-10 NOTE — Telephone Encounter (Signed)
It appears the patient was changed to atorvastatin 20 mg daily in the hospital.  This was sent to his pharmacy.

## 2018-07-10 NOTE — Telephone Encounter (Signed)
Looks like this medication was discontinued at discharge.

## 2018-07-17 DIAGNOSIS — M6281 Muscle weakness (generalized): Secondary | ICD-10-CM | POA: Diagnosis not present

## 2018-07-17 DIAGNOSIS — I10 Essential (primary) hypertension: Secondary | ICD-10-CM | POA: Diagnosis not present

## 2018-07-17 DIAGNOSIS — R69 Illness, unspecified: Secondary | ICD-10-CM | POA: Diagnosis not present

## 2018-07-17 DIAGNOSIS — B342 Coronavirus infection, unspecified: Secondary | ICD-10-CM | POA: Diagnosis not present

## 2018-07-17 DIAGNOSIS — E1165 Type 2 diabetes mellitus with hyperglycemia: Secondary | ICD-10-CM | POA: Diagnosis not present

## 2018-07-17 DIAGNOSIS — J441 Chronic obstructive pulmonary disease with (acute) exacerbation: Secondary | ICD-10-CM | POA: Diagnosis not present

## 2018-07-17 DIAGNOSIS — R251 Tremor, unspecified: Secondary | ICD-10-CM | POA: Diagnosis not present

## 2018-07-17 DIAGNOSIS — K219 Gastro-esophageal reflux disease without esophagitis: Secondary | ICD-10-CM | POA: Diagnosis not present

## 2018-07-17 DIAGNOSIS — I509 Heart failure, unspecified: Secondary | ICD-10-CM | POA: Diagnosis not present

## 2018-07-17 DIAGNOSIS — Z20828 Contact with and (suspected) exposure to other viral communicable diseases: Secondary | ICD-10-CM | POA: Diagnosis not present

## 2018-07-19 ENCOUNTER — Ambulatory Visit: Payer: Medicare HMO | Admitting: Cardiovascular Disease

## 2018-07-19 DIAGNOSIS — I10 Essential (primary) hypertension: Secondary | ICD-10-CM | POA: Diagnosis not present

## 2018-07-19 DIAGNOSIS — E1165 Type 2 diabetes mellitus with hyperglycemia: Secondary | ICD-10-CM | POA: Diagnosis not present

## 2018-07-19 DIAGNOSIS — M6281 Muscle weakness (generalized): Secondary | ICD-10-CM | POA: Diagnosis not present

## 2018-07-19 DIAGNOSIS — R69 Illness, unspecified: Secondary | ICD-10-CM | POA: Diagnosis not present

## 2018-07-19 DIAGNOSIS — R251 Tremor, unspecified: Secondary | ICD-10-CM | POA: Diagnosis not present

## 2018-07-19 DIAGNOSIS — I509 Heart failure, unspecified: Secondary | ICD-10-CM | POA: Diagnosis not present

## 2018-07-19 DIAGNOSIS — J441 Chronic obstructive pulmonary disease with (acute) exacerbation: Secondary | ICD-10-CM | POA: Diagnosis not present

## 2018-07-19 DIAGNOSIS — K219 Gastro-esophageal reflux disease without esophagitis: Secondary | ICD-10-CM | POA: Diagnosis not present

## 2018-07-20 ENCOUNTER — Telehealth: Payer: Self-pay | Admitting: Urology

## 2018-07-20 DIAGNOSIS — I10 Essential (primary) hypertension: Secondary | ICD-10-CM | POA: Diagnosis not present

## 2018-07-20 DIAGNOSIS — K219 Gastro-esophageal reflux disease without esophagitis: Secondary | ICD-10-CM | POA: Diagnosis not present

## 2018-07-20 DIAGNOSIS — M6281 Muscle weakness (generalized): Secondary | ICD-10-CM | POA: Diagnosis not present

## 2018-07-20 DIAGNOSIS — R69 Illness, unspecified: Secondary | ICD-10-CM | POA: Diagnosis not present

## 2018-07-20 DIAGNOSIS — J441 Chronic obstructive pulmonary disease with (acute) exacerbation: Secondary | ICD-10-CM | POA: Diagnosis not present

## 2018-07-20 DIAGNOSIS — R251 Tremor, unspecified: Secondary | ICD-10-CM | POA: Diagnosis not present

## 2018-07-20 DIAGNOSIS — I509 Heart failure, unspecified: Secondary | ICD-10-CM | POA: Diagnosis not present

## 2018-07-20 DIAGNOSIS — E1165 Type 2 diabetes mellitus with hyperglycemia: Secondary | ICD-10-CM | POA: Diagnosis not present

## 2018-07-20 NOTE — Telephone Encounter (Signed)
PT'S DAUGHTER CX APP UNTIL SHE SPEAKS WITH HIS FAMILY DR WILL CB LATER TO RESCHD IF NEEDED. She said that they are just not sure that he needs to have it done and want to discuss it with his family dr. Before moving forward with it, she also said that they didn't think he would tolerate the procedure so they just wanted to wait for now.

## 2018-07-23 ENCOUNTER — Other Ambulatory Visit
Admission: RE | Admit: 2018-07-23 | Discharge: 2018-07-23 | Disposition: A | Discharge status: Home / Self Care | Payer: Medicare HMO | Source: Ambulatory Visit | Attending: Family Medicine | Admitting: Family Medicine

## 2018-07-23 DIAGNOSIS — E1165 Type 2 diabetes mellitus with hyperglycemia: Secondary | ICD-10-CM | POA: Diagnosis not present

## 2018-07-23 DIAGNOSIS — N390 Urinary tract infection, site not specified: Secondary | ICD-10-CM | POA: Insufficient documentation

## 2018-07-23 DIAGNOSIS — R69 Illness, unspecified: Secondary | ICD-10-CM | POA: Diagnosis not present

## 2018-07-23 DIAGNOSIS — J449 Chronic obstructive pulmonary disease, unspecified: Secondary | ICD-10-CM | POA: Diagnosis not present

## 2018-07-23 DIAGNOSIS — M6281 Muscle weakness (generalized): Secondary | ICD-10-CM | POA: Diagnosis not present

## 2018-07-23 DIAGNOSIS — I509 Heart failure, unspecified: Secondary | ICD-10-CM | POA: Diagnosis not present

## 2018-07-23 DIAGNOSIS — G4700 Insomnia, unspecified: Secondary | ICD-10-CM | POA: Diagnosis not present

## 2018-07-23 DIAGNOSIS — R251 Tremor, unspecified: Secondary | ICD-10-CM | POA: Diagnosis not present

## 2018-07-23 DIAGNOSIS — I4891 Unspecified atrial fibrillation: Secondary | ICD-10-CM | POA: Diagnosis not present

## 2018-07-23 DIAGNOSIS — J441 Chronic obstructive pulmonary disease with (acute) exacerbation: Secondary | ICD-10-CM | POA: Diagnosis not present

## 2018-07-23 DIAGNOSIS — I10 Essential (primary) hypertension: Secondary | ICD-10-CM | POA: Diagnosis not present

## 2018-07-23 DIAGNOSIS — K219 Gastro-esophageal reflux disease without esophagitis: Secondary | ICD-10-CM | POA: Diagnosis not present

## 2018-07-23 LAB — URINALYSIS, COMPLETE (UACMP) WITH MICROSCOPIC
Bacteria, UA: NONE SEEN
Bilirubin Urine: NEGATIVE
Glucose, UA: NEGATIVE mg/dL
Hgb urine dipstick: NEGATIVE
Ketones, ur: NEGATIVE mg/dL
Leukocytes,Ua: NEGATIVE
Nitrite: NEGATIVE
Protein, ur: NEGATIVE mg/dL
Specific Gravity, Urine: 1.015 (ref 1.005–1.030)
Squamous Epithelial / HPF: NONE SEEN (ref 0–5)
WBC, UA: NONE SEEN WBC/hpf (ref 0–5)
pH: 6.5 (ref 5.0–8.0)

## 2018-07-24 ENCOUNTER — Other Ambulatory Visit: Payer: Self-pay | Admitting: Urology

## 2018-07-24 DIAGNOSIS — M6281 Muscle weakness (generalized): Secondary | ICD-10-CM | POA: Diagnosis not present

## 2018-07-24 DIAGNOSIS — K219 Gastro-esophageal reflux disease without esophagitis: Secondary | ICD-10-CM | POA: Diagnosis not present

## 2018-07-24 DIAGNOSIS — I10 Essential (primary) hypertension: Secondary | ICD-10-CM | POA: Diagnosis not present

## 2018-07-24 DIAGNOSIS — I509 Heart failure, unspecified: Secondary | ICD-10-CM | POA: Diagnosis not present

## 2018-07-24 DIAGNOSIS — E1165 Type 2 diabetes mellitus with hyperglycemia: Secondary | ICD-10-CM | POA: Diagnosis not present

## 2018-07-24 DIAGNOSIS — R251 Tremor, unspecified: Secondary | ICD-10-CM | POA: Diagnosis not present

## 2018-07-24 DIAGNOSIS — R69 Illness, unspecified: Secondary | ICD-10-CM | POA: Diagnosis not present

## 2018-07-24 DIAGNOSIS — J441 Chronic obstructive pulmonary disease with (acute) exacerbation: Secondary | ICD-10-CM | POA: Diagnosis not present

## 2018-07-24 LAB — URINE CULTURE: Culture: <10000 — AB

## 2018-07-25 DIAGNOSIS — I509 Heart failure, unspecified: Secondary | ICD-10-CM | POA: Diagnosis not present

## 2018-07-25 DIAGNOSIS — M6281 Muscle weakness (generalized): Secondary | ICD-10-CM | POA: Diagnosis not present

## 2018-07-25 DIAGNOSIS — E1165 Type 2 diabetes mellitus with hyperglycemia: Secondary | ICD-10-CM | POA: Diagnosis not present

## 2018-07-25 DIAGNOSIS — I10 Essential (primary) hypertension: Secondary | ICD-10-CM | POA: Diagnosis not present

## 2018-07-25 DIAGNOSIS — J441 Chronic obstructive pulmonary disease with (acute) exacerbation: Secondary | ICD-10-CM | POA: Diagnosis not present

## 2018-07-25 DIAGNOSIS — R69 Illness, unspecified: Secondary | ICD-10-CM | POA: Diagnosis not present

## 2018-07-25 DIAGNOSIS — R251 Tremor, unspecified: Secondary | ICD-10-CM | POA: Diagnosis not present

## 2018-07-25 DIAGNOSIS — K219 Gastro-esophageal reflux disease without esophagitis: Secondary | ICD-10-CM | POA: Diagnosis not present

## 2018-07-26 DIAGNOSIS — K219 Gastro-esophageal reflux disease without esophagitis: Secondary | ICD-10-CM | POA: Diagnosis not present

## 2018-07-26 DIAGNOSIS — J441 Chronic obstructive pulmonary disease with (acute) exacerbation: Secondary | ICD-10-CM | POA: Diagnosis not present

## 2018-07-26 DIAGNOSIS — I509 Heart failure, unspecified: Secondary | ICD-10-CM | POA: Diagnosis not present

## 2018-07-26 DIAGNOSIS — E1165 Type 2 diabetes mellitus with hyperglycemia: Secondary | ICD-10-CM | POA: Diagnosis not present

## 2018-07-26 DIAGNOSIS — M6281 Muscle weakness (generalized): Secondary | ICD-10-CM | POA: Diagnosis not present

## 2018-07-26 DIAGNOSIS — R251 Tremor, unspecified: Secondary | ICD-10-CM | POA: Diagnosis not present

## 2018-07-26 DIAGNOSIS — I10 Essential (primary) hypertension: Secondary | ICD-10-CM | POA: Diagnosis not present

## 2018-07-26 DIAGNOSIS — R69 Illness, unspecified: Secondary | ICD-10-CM | POA: Diagnosis not present

## 2018-07-27 DIAGNOSIS — R69 Illness, unspecified: Secondary | ICD-10-CM | POA: Diagnosis not present

## 2018-07-27 DIAGNOSIS — I10 Essential (primary) hypertension: Secondary | ICD-10-CM | POA: Diagnosis not present

## 2018-07-27 DIAGNOSIS — R251 Tremor, unspecified: Secondary | ICD-10-CM | POA: Diagnosis not present

## 2018-07-27 DIAGNOSIS — E1165 Type 2 diabetes mellitus with hyperglycemia: Secondary | ICD-10-CM | POA: Diagnosis not present

## 2018-07-27 DIAGNOSIS — J441 Chronic obstructive pulmonary disease with (acute) exacerbation: Secondary | ICD-10-CM | POA: Diagnosis not present

## 2018-07-27 DIAGNOSIS — I509 Heart failure, unspecified: Secondary | ICD-10-CM | POA: Diagnosis not present

## 2018-07-27 DIAGNOSIS — M6281 Muscle weakness (generalized): Secondary | ICD-10-CM | POA: Diagnosis not present

## 2018-07-27 DIAGNOSIS — K219 Gastro-esophageal reflux disease without esophagitis: Secondary | ICD-10-CM | POA: Diagnosis not present

## 2018-07-30 DIAGNOSIS — R69 Illness, unspecified: Secondary | ICD-10-CM | POA: Diagnosis not present

## 2018-07-30 DIAGNOSIS — E1165 Type 2 diabetes mellitus with hyperglycemia: Secondary | ICD-10-CM | POA: Diagnosis not present

## 2018-07-30 DIAGNOSIS — I509 Heart failure, unspecified: Secondary | ICD-10-CM | POA: Diagnosis not present

## 2018-07-30 DIAGNOSIS — R251 Tremor, unspecified: Secondary | ICD-10-CM | POA: Diagnosis not present

## 2018-07-30 DIAGNOSIS — J441 Chronic obstructive pulmonary disease with (acute) exacerbation: Secondary | ICD-10-CM | POA: Diagnosis not present

## 2018-07-30 DIAGNOSIS — I10 Essential (primary) hypertension: Secondary | ICD-10-CM | POA: Diagnosis not present

## 2018-07-30 DIAGNOSIS — K219 Gastro-esophageal reflux disease without esophagitis: Secondary | ICD-10-CM | POA: Diagnosis not present

## 2018-07-30 DIAGNOSIS — M6281 Muscle weakness (generalized): Secondary | ICD-10-CM | POA: Diagnosis not present

## 2018-07-31 DIAGNOSIS — R69 Illness, unspecified: Secondary | ICD-10-CM | POA: Diagnosis not present

## 2018-07-31 DIAGNOSIS — M6281 Muscle weakness (generalized): Secondary | ICD-10-CM | POA: Diagnosis not present

## 2018-07-31 DIAGNOSIS — I10 Essential (primary) hypertension: Secondary | ICD-10-CM | POA: Diagnosis not present

## 2018-07-31 DIAGNOSIS — E785 Hyperlipidemia, unspecified: Secondary | ICD-10-CM | POA: Diagnosis not present

## 2018-07-31 DIAGNOSIS — J449 Chronic obstructive pulmonary disease, unspecified: Secondary | ICD-10-CM | POA: Diagnosis not present

## 2018-07-31 DIAGNOSIS — E119 Type 2 diabetes mellitus without complications: Secondary | ICD-10-CM | POA: Diagnosis not present

## 2018-07-31 DIAGNOSIS — R251 Tremor, unspecified: Secondary | ICD-10-CM | POA: Diagnosis not present

## 2018-08-01 DIAGNOSIS — K219 Gastro-esophageal reflux disease without esophagitis: Secondary | ICD-10-CM | POA: Diagnosis not present

## 2018-08-01 DIAGNOSIS — I10 Essential (primary) hypertension: Secondary | ICD-10-CM | POA: Diagnosis not present

## 2018-08-01 DIAGNOSIS — R69 Illness, unspecified: Secondary | ICD-10-CM | POA: Diagnosis not present

## 2018-08-01 DIAGNOSIS — R251 Tremor, unspecified: Secondary | ICD-10-CM | POA: Diagnosis not present

## 2018-08-01 DIAGNOSIS — M6281 Muscle weakness (generalized): Secondary | ICD-10-CM | POA: Diagnosis not present

## 2018-08-01 DIAGNOSIS — E1165 Type 2 diabetes mellitus with hyperglycemia: Secondary | ICD-10-CM | POA: Diagnosis not present

## 2018-08-01 DIAGNOSIS — I509 Heart failure, unspecified: Secondary | ICD-10-CM | POA: Diagnosis not present

## 2018-08-01 DIAGNOSIS — J441 Chronic obstructive pulmonary disease with (acute) exacerbation: Secondary | ICD-10-CM | POA: Diagnosis not present

## 2018-08-02 DIAGNOSIS — I10 Essential (primary) hypertension: Secondary | ICD-10-CM | POA: Diagnosis not present

## 2018-08-02 DIAGNOSIS — R69 Illness, unspecified: Secondary | ICD-10-CM | POA: Diagnosis not present

## 2018-08-02 DIAGNOSIS — I509 Heart failure, unspecified: Secondary | ICD-10-CM | POA: Diagnosis not present

## 2018-08-02 DIAGNOSIS — K219 Gastro-esophageal reflux disease without esophagitis: Secondary | ICD-10-CM | POA: Diagnosis not present

## 2018-08-02 DIAGNOSIS — R251 Tremor, unspecified: Secondary | ICD-10-CM | POA: Diagnosis not present

## 2018-08-02 DIAGNOSIS — J441 Chronic obstructive pulmonary disease with (acute) exacerbation: Secondary | ICD-10-CM | POA: Diagnosis not present

## 2018-08-02 DIAGNOSIS — M6281 Muscle weakness (generalized): Secondary | ICD-10-CM | POA: Diagnosis not present

## 2018-08-02 DIAGNOSIS — E1165 Type 2 diabetes mellitus with hyperglycemia: Secondary | ICD-10-CM | POA: Diagnosis not present

## 2018-08-03 DIAGNOSIS — M6281 Muscle weakness (generalized): Secondary | ICD-10-CM | POA: Diagnosis not present

## 2018-08-03 DIAGNOSIS — R69 Illness, unspecified: Secondary | ICD-10-CM | POA: Diagnosis not present

## 2018-08-03 DIAGNOSIS — D649 Anemia, unspecified: Secondary | ICD-10-CM | POA: Diagnosis not present

## 2018-08-03 DIAGNOSIS — I509 Heart failure, unspecified: Secondary | ICD-10-CM | POA: Diagnosis not present

## 2018-08-03 DIAGNOSIS — E785 Hyperlipidemia, unspecified: Secondary | ICD-10-CM | POA: Diagnosis not present

## 2018-08-03 DIAGNOSIS — R251 Tremor, unspecified: Secondary | ICD-10-CM | POA: Diagnosis not present

## 2018-08-03 DIAGNOSIS — K219 Gastro-esophageal reflux disease without esophagitis: Secondary | ICD-10-CM | POA: Diagnosis not present

## 2018-08-03 DIAGNOSIS — I10 Essential (primary) hypertension: Secondary | ICD-10-CM | POA: Diagnosis not present

## 2018-08-03 DIAGNOSIS — J441 Chronic obstructive pulmonary disease with (acute) exacerbation: Secondary | ICD-10-CM | POA: Diagnosis not present

## 2018-08-03 DIAGNOSIS — E1165 Type 2 diabetes mellitus with hyperglycemia: Secondary | ICD-10-CM | POA: Diagnosis not present

## 2018-08-06 DIAGNOSIS — J449 Chronic obstructive pulmonary disease, unspecified: Secondary | ICD-10-CM | POA: Diagnosis not present

## 2018-08-06 DIAGNOSIS — K219 Gastro-esophageal reflux disease without esophagitis: Secondary | ICD-10-CM | POA: Diagnosis not present

## 2018-08-06 DIAGNOSIS — M6281 Muscle weakness (generalized): Secondary | ICD-10-CM | POA: Diagnosis not present

## 2018-08-06 DIAGNOSIS — E1165 Type 2 diabetes mellitus with hyperglycemia: Secondary | ICD-10-CM | POA: Diagnosis not present

## 2018-08-06 DIAGNOSIS — R251 Tremor, unspecified: Secondary | ICD-10-CM | POA: Diagnosis not present

## 2018-08-06 DIAGNOSIS — I4891 Unspecified atrial fibrillation: Secondary | ICD-10-CM | POA: Diagnosis not present

## 2018-08-06 DIAGNOSIS — G4700 Insomnia, unspecified: Secondary | ICD-10-CM | POA: Diagnosis not present

## 2018-08-06 DIAGNOSIS — I10 Essential (primary) hypertension: Secondary | ICD-10-CM | POA: Diagnosis not present

## 2018-08-06 DIAGNOSIS — R69 Illness, unspecified: Secondary | ICD-10-CM | POA: Diagnosis not present

## 2018-08-20 ENCOUNTER — Other Ambulatory Visit: Payer: Self-pay | Admitting: Family Medicine

## 2018-08-24 DIAGNOSIS — E119 Type 2 diabetes mellitus without complications: Secondary | ICD-10-CM | POA: Diagnosis not present

## 2018-08-24 DIAGNOSIS — H35013 Changes in retinal vascular appearance, bilateral: Secondary | ICD-10-CM | POA: Diagnosis not present

## 2018-08-24 DIAGNOSIS — Z961 Presence of intraocular lens: Secondary | ICD-10-CM | POA: Diagnosis not present

## 2018-08-24 DIAGNOSIS — H2589 Other age-related cataract: Secondary | ICD-10-CM | POA: Diagnosis not present

## 2018-08-27 DIAGNOSIS — J449 Chronic obstructive pulmonary disease, unspecified: Secondary | ICD-10-CM | POA: Diagnosis not present

## 2018-08-27 DIAGNOSIS — I4891 Unspecified atrial fibrillation: Secondary | ICD-10-CM | POA: Diagnosis not present

## 2018-08-27 DIAGNOSIS — R69 Illness, unspecified: Secondary | ICD-10-CM | POA: Diagnosis not present

## 2018-08-27 DIAGNOSIS — G4700 Insomnia, unspecified: Secondary | ICD-10-CM | POA: Diagnosis not present

## 2018-08-27 DIAGNOSIS — E1165 Type 2 diabetes mellitus with hyperglycemia: Secondary | ICD-10-CM | POA: Diagnosis not present

## 2018-08-29 NOTE — Telephone Encounter (Signed)
Have they spoken with Mr. Volker PCP regarding the cystoscopy?   Are they ready to schedule the procedure at this time?

## 2018-08-30 DIAGNOSIS — B351 Tinea unguium: Secondary | ICD-10-CM | POA: Diagnosis not present

## 2018-08-30 DIAGNOSIS — Z7984 Long term (current) use of oral hypoglycemic drugs: Secondary | ICD-10-CM | POA: Diagnosis not present

## 2018-08-30 DIAGNOSIS — I739 Peripheral vascular disease, unspecified: Secondary | ICD-10-CM | POA: Diagnosis not present

## 2018-08-31 NOTE — Telephone Encounter (Signed)
I called pt's daughter [name] and she would like a call back to discuss if he is "really needs this Cysto". She would like for you to call Dr Lovie Macadamia @ [facility] [phone] or she said you could call her back.

## 2018-08-31 NOTE — Telephone Encounter (Signed)
Chase Simmons is Dr. Cherrie Gauze patient, so I will need to defer to her recommendations.

## 2018-08-31 NOTE — Telephone Encounter (Signed)
Thank you :)

## 2018-09-03 DIAGNOSIS — Z20828 Contact with and (suspected) exposure to other viral communicable diseases: Secondary | ICD-10-CM | POA: Diagnosis not present

## 2018-09-10 DIAGNOSIS — M6281 Muscle weakness (generalized): Secondary | ICD-10-CM | POA: Diagnosis not present

## 2018-09-10 DIAGNOSIS — Z993 Dependence on wheelchair: Secondary | ICD-10-CM | POA: Diagnosis not present

## 2018-09-10 DIAGNOSIS — I2510 Atherosclerotic heart disease of native coronary artery without angina pectoris: Secondary | ICD-10-CM | POA: Diagnosis not present

## 2018-09-10 DIAGNOSIS — J449 Chronic obstructive pulmonary disease, unspecified: Secondary | ICD-10-CM | POA: Diagnosis not present

## 2018-09-10 DIAGNOSIS — Z20828 Contact with and (suspected) exposure to other viral communicable diseases: Secondary | ICD-10-CM | POA: Diagnosis not present

## 2018-09-10 DIAGNOSIS — I10 Essential (primary) hypertension: Secondary | ICD-10-CM | POA: Diagnosis not present

## 2018-09-10 DIAGNOSIS — R69 Illness, unspecified: Secondary | ICD-10-CM | POA: Diagnosis not present

## 2018-09-10 DIAGNOSIS — E785 Hyperlipidemia, unspecified: Secondary | ICD-10-CM | POA: Diagnosis not present

## 2018-09-10 DIAGNOSIS — R251 Tremor, unspecified: Secondary | ICD-10-CM | POA: Diagnosis not present

## 2018-09-10 DIAGNOSIS — I480 Paroxysmal atrial fibrillation: Secondary | ICD-10-CM | POA: Diagnosis not present

## 2018-09-10 DIAGNOSIS — E119 Type 2 diabetes mellitus without complications: Secondary | ICD-10-CM | POA: Diagnosis not present

## 2018-09-17 DIAGNOSIS — Z1159 Encounter for screening for other viral diseases: Secondary | ICD-10-CM | POA: Diagnosis not present

## 2018-09-24 DIAGNOSIS — J449 Chronic obstructive pulmonary disease, unspecified: Secondary | ICD-10-CM | POA: Diagnosis not present

## 2018-09-24 DIAGNOSIS — R69 Illness, unspecified: Secondary | ICD-10-CM | POA: Diagnosis not present

## 2018-09-24 DIAGNOSIS — I4891 Unspecified atrial fibrillation: Secondary | ICD-10-CM | POA: Diagnosis not present

## 2018-09-24 DIAGNOSIS — Z1159 Encounter for screening for other viral diseases: Secondary | ICD-10-CM | POA: Diagnosis not present

## 2018-09-24 DIAGNOSIS — G4700 Insomnia, unspecified: Secondary | ICD-10-CM | POA: Diagnosis not present

## 2018-09-24 DIAGNOSIS — E1165 Type 2 diabetes mellitus with hyperglycemia: Secondary | ICD-10-CM | POA: Diagnosis not present

## 2018-09-25 ENCOUNTER — Telehealth: Payer: Self-pay | Admitting: Urology

## 2018-09-25 NOTE — Telephone Encounter (Signed)
Dr. Erlene Quan feels it would be best for Mr. Longton to have the cystoscopy so that we may identify any small bladder tumors that may arise.  That way if the tumor is small, he can have it fulgurated in the office versus going to the OR to remove a large tumor.    I left a voice mail on his daughter's, [name], mobile to call us back.  There were no identifiers on the voice mail so I could leave my name and ask her to call us back.

## 2018-10-01 NOTE — Telephone Encounter (Signed)
I have left a message on Chase Simmons's mobile.  I have not been able to reach Dr. Lovie Macadamia to speak with him regarding her father.  If she should speak or see Dr. Lovie Macadamia, would she have him call us.

## 2018-10-01 NOTE — Telephone Encounter (Signed)
I have spoken with Mr. Sisak other daughter, [name], and relayed Dr. Cherrie Gauze recommendations.  I also informed her that I have not been able to speak with Dr. Lovie Macadamia regarding his thoughts about Mr. Mulgrew's condition and ability to handle a cystoscopy in the office.

## 2018-10-01 NOTE — Telephone Encounter (Signed)
I have spoken with [name], Mr. Weinert daughter, regarding his surveillance cystoscopy. Dr. Erlene Quan recommended proceeding with the in office cystoscopy, so that if he should have a recurrance of the bladder cancer we could catch it while it was still small and fulgerate in the office. But, Mr. Kulish condition has worsened significantly since we last saw him in 09/2017 especially in regards to his dementia. His daughter feels he may not tolerate a cystoscopy in the office because of his dementia. She would like Dr. Reuel Boom opinion on Mr. Teague's ability to tolerate an in office cystoscopy at this time. She is also concerned that if he came to the office for the cystoscopy that he would have to be quarantined in a diiferent location at [facility] resources and she feels this would be detrimental to his current mental status.  I have left messages with [facility], sent a secure chat message and a staff message to Dr. Lovie Macadamia and have not been able to speak with him.

## 2018-10-16 DIAGNOSIS — Z1159 Encounter for screening for other viral diseases: Secondary | ICD-10-CM | POA: Diagnosis not present

## 2018-10-18 ENCOUNTER — Inpatient Hospital Stay
Admission: EM | Admit: 2018-10-18 | Discharge: 2018-10-20 | DRG: 178 | Disposition: A | Discharge status: Other Acute Inpatient Hospital | Payer: Medicare HMO | Attending: Internal Medicine | Admitting: Internal Medicine

## 2018-10-18 ENCOUNTER — Other Ambulatory Visit: Payer: Self-pay

## 2018-10-18 ENCOUNTER — Emergency Department: Payer: Medicare HMO

## 2018-10-18 DIAGNOSIS — J449 Chronic obstructive pulmonary disease, unspecified: Secondary | ICD-10-CM | POA: Diagnosis not present

## 2018-10-18 DIAGNOSIS — R001 Bradycardia, unspecified: Secondary | ICD-10-CM | POA: Diagnosis not present

## 2018-10-18 DIAGNOSIS — I472 Ventricular tachycardia: Secondary | ICD-10-CM | POA: Diagnosis not present

## 2018-10-18 DIAGNOSIS — E785 Hyperlipidemia, unspecified: Secondary | ICD-10-CM | POA: Diagnosis present

## 2018-10-18 DIAGNOSIS — R7989 Other specified abnormal findings of blood chemistry: Secondary | ICD-10-CM | POA: Diagnosis not present

## 2018-10-18 DIAGNOSIS — I428 Other cardiomyopathies: Secondary | ICD-10-CM | POA: Diagnosis present

## 2018-10-18 DIAGNOSIS — I10 Essential (primary) hypertension: Secondary | ICD-10-CM | POA: Diagnosis not present

## 2018-10-18 DIAGNOSIS — Z79899 Other long term (current) drug therapy: Secondary | ICD-10-CM

## 2018-10-18 DIAGNOSIS — I480 Paroxysmal atrial fibrillation: Secondary | ICD-10-CM | POA: Diagnosis not present

## 2018-10-18 DIAGNOSIS — R5381 Other malaise: Secondary | ICD-10-CM | POA: Diagnosis not present

## 2018-10-18 DIAGNOSIS — I4729 Other ventricular tachycardia: Secondary | ICD-10-CM

## 2018-10-18 DIAGNOSIS — Z7982 Long term (current) use of aspirin: Secondary | ICD-10-CM

## 2018-10-18 DIAGNOSIS — I2510 Atherosclerotic heart disease of native coronary artery without angina pectoris: Secondary | ICD-10-CM | POA: Diagnosis present

## 2018-10-18 DIAGNOSIS — R0602 Shortness of breath: Secondary | ICD-10-CM | POA: Diagnosis not present

## 2018-10-18 DIAGNOSIS — E1169 Type 2 diabetes mellitus with other specified complication: Secondary | ICD-10-CM | POA: Diagnosis present

## 2018-10-18 DIAGNOSIS — Z8551 Personal history of malignant neoplasm of bladder: Secondary | ICD-10-CM

## 2018-10-18 DIAGNOSIS — E118 Type 2 diabetes mellitus with unspecified complications: Secondary | ICD-10-CM | POA: Diagnosis present

## 2018-10-18 DIAGNOSIS — R778 Other specified abnormalities of plasma proteins: Secondary | ICD-10-CM

## 2018-10-18 DIAGNOSIS — U071 COVID-19: Secondary | ICD-10-CM | POA: Diagnosis not present

## 2018-10-18 DIAGNOSIS — R509 Fever, unspecified: Secondary | ICD-10-CM | POA: Diagnosis not present

## 2018-10-18 DIAGNOSIS — Z66 Do not resuscitate: Secondary | ICD-10-CM | POA: Diagnosis present

## 2018-10-18 DIAGNOSIS — Z91040 Latex allergy status: Secondary | ICD-10-CM

## 2018-10-18 DIAGNOSIS — Z7984 Long term (current) use of oral hypoglycemic drugs: Secondary | ICD-10-CM

## 2018-10-18 DIAGNOSIS — Z87891 Personal history of nicotine dependence: Secondary | ICD-10-CM

## 2018-10-18 DIAGNOSIS — K219 Gastro-esophageal reflux disease without esophagitis: Secondary | ICD-10-CM | POA: Diagnosis present

## 2018-10-18 DIAGNOSIS — E119 Type 2 diabetes mellitus without complications: Secondary | ICD-10-CM | POA: Diagnosis not present

## 2018-10-18 LAB — HEPATIC FUNCTION PANEL
ALT: 15 U/L (ref 0–44)
AST: 21 U/L (ref 15–41)
Albumin: 2.8 g/dL — ABNORMAL LOW (ref 3.5–5.0)
Alkaline Phosphatase: 54 U/L (ref 38–126)
Bilirubin, Direct: <0.1 mg/dL (ref 0.0–0.2)
Total Bilirubin: 0.5 mg/dL (ref 0.3–1.2)
Total Protein: 5.9 g/dL — ABNORMAL LOW (ref 6.5–8.1)

## 2018-10-18 LAB — CBC WITH DIFFERENTIAL/PLATELET
Abs Immature Granulocytes: 0.01 10*3/uL (ref 0.00–0.07)
Basophils Absolute: 0 10*3/uL (ref 0.0–0.1)
Basophils Relative: 0 %
Eosinophils Absolute: 0 10*3/uL (ref 0.0–0.5)
Eosinophils Relative: 1 %
HCT: 38.9 % — ABNORMAL LOW (ref 39.0–52.0)
Hemoglobin: 12.9 g/dL — ABNORMAL LOW (ref 13.0–17.0)
Immature Granulocytes: 0 %
Lymphocytes Relative: 23 %
Lymphs Abs: 1 10*3/uL (ref 0.7–4.0)
MCH: 34.1 pg — ABNORMAL HIGH (ref 26.0–34.0)
MCHC: 33.2 g/dL (ref 30.0–36.0)
MCV: 102.9 fL — ABNORMAL HIGH (ref 80.0–100.0)
Monocytes Absolute: 0.4 10*3/uL (ref 0.1–1.0)
Monocytes Relative: 8 %
Neutro Abs: 2.9 10*3/uL (ref 1.7–7.7)
Neutrophils Relative %: 68 %
Platelets: 126 10*3/uL — ABNORMAL LOW (ref 150–400)
RBC: 3.78 MIL/uL — ABNORMAL LOW (ref 4.22–5.81)
RDW: 13.1 % (ref 11.5–15.5)
WBC: 4.3 10*3/uL (ref 4.0–10.5)
nRBC: 0 % (ref 0.0–0.2)

## 2018-10-18 LAB — BASIC METABOLIC PANEL
Anion gap: 5 (ref 5–15)
BUN: 25 mg/dL — ABNORMAL HIGH (ref 8–23)
CO2: 24 mmol/L (ref 22–32)
Calcium: 8.3 mg/dL — ABNORMAL LOW (ref 8.9–10.3)
Chloride: 116 mmol/L — ABNORMAL HIGH (ref 98–111)
Creatinine, Ser: 0.83 mg/dL (ref 0.61–1.24)
GFR calc Af Amer: >60 mL/min (ref >60)
GFR calc non Af Amer: >60 mL/min (ref >60)
Glucose, Bld: 80 mg/dL (ref 70–99)
Potassium: 3.6 mmol/L (ref 3.5–5.1)
Sodium: 145 mmol/L (ref 135–145)

## 2018-10-18 LAB — PROCALCITONIN: Procalcitonin: <0.1 ng/mL

## 2018-10-18 LAB — TROPONIN I (HIGH SENSITIVITY)
Troponin I (High Sensitivity): 25 ng/L — ABNORMAL HIGH (ref <18)
Troponin I (High Sensitivity): 28 ng/L — ABNORMAL HIGH (ref <18)

## 2018-10-18 MED ORDER — ACETAMINOPHEN 325 MG PO TABS: 650.0000 mg | ORAL_TABLET | Freq: Once | ORAL | Status: DC

## 2018-10-18 MED ORDER — ALBUTEROL SULFATE HFA 108 (90 BASE) MCG/ACT IN AERS
2.0000 | INHALATION_SPRAY | Freq: Once | RESPIRATORY_TRACT | Status: DC
  Filled 2018-10-18: qty 6.7

## 2018-10-18 MED ORDER — METHYLPREDNISOLONE SODIUM SUCC 125 MG IJ SOLR
125.0000 mg | Freq: Once | INTRAMUSCULAR | Status: AC
  Administered 2018-10-18: 19:00:00 125 mg via INTRAVENOUS
  Filled 2018-10-18: qty 2

## 2018-10-18 MED ORDER — ACETAMINOPHEN 650 MG RE SUPP
650.0000 mg | Freq: Once | RECTAL | Status: AC
  Administered 2018-10-18: 17:00:00 650 mg via RECTAL

## 2018-10-18 NOTE — ED Notes (Signed)
X-ray is at bedside.

## 2018-10-18 NOTE — ED Provider Notes (Addendum)
[facility] Emergency Department Provider Note  ____________________________________________   First MD Initiated Contact with Patient 10/18/18 1654     (approximate)  I have reviewed the triage vital signs and the nursing notes.   HISTORY  Chief Complaint COVID +    HPI Chase Simmons is a 85 y.o. male  Here with COVID+. Pt has an extensive history including CAD, CHF, COPD, HTN, HLD, bladder CA, here for evaluation of COVID positivity and increasing fever, ? SOB.  Discussed with daughter, [name]. Per report, family notified this morning he was COVID+ but not showing any symptoms. Told he had no temperature at that time. This PM, got a call from RN that he had a temperature from 83F to 99.62F in a short period of time.    He seemed more SOB. Family subsequently decided to transfer to hospital.  Level 5 caveat invoked as remainder of history, ROS, and physical exam limited due to patient's confusion/AMS.    Past Medical History:  Diagnosis Date  . Allergy   . Bladder cancer ([place])    a. followed by Dr. Jacqlyn Larsen  . Chronic combined systolic (congestive) and diastolic (congestive) heart failure ([place])    a. 07/2016 Echo: EF 30-35%, sev mid-apicalanteroseptal, ant, inf HK, Gr1 DD.  Marland Kitchen COPD (chronic obstructive pulmonary disease) ([place])   . Hyperlipidemia   . Hypertension   . NICM (nonischemic cardiomyopathy) ([place])    a. 07/2016 Echo: EF 30-35%, sev mid-apicalanteroseptal, ant, inf HK, Gr1 DD, mild MR, mildly dil LA, PASP 46mmHg - ? stress induced CM.  Marland Kitchen Nonobstructive Coronary atherosclerosis    a. 07/2016 Cath: LM nl, LAD nl, D1 40ost, LCX 19m, RCA nl, EF 25-30%.  Marland Kitchen PAF (paroxysmal atrial fibrillation) ([place])    a. Dx 08/2013. CHA2DS2VASc = 6-->no OAC 2/2 h/o falls and nocompliance.  . Tremor, essential    sees [facility] neurologist  . Type 2 diabetes mellitus with complication, without long-term current use of insulin ([place]) 08/01/2012  . Urinary incontinence      Patient Active Problem List   Diagnosis Date Noted  . COVID-19 virus infection 10/18/2018  . Weakness 06/07/2018  . Skin tear of elbow without complication [id number]  . Lactic acidosis 06/06/2018  . Depression 04/20/2018  . Altered mental status   . Frequency of urination 03/01/2018  . Confusion 03/01/2018  . Bilateral leg edema 11/08/2016  . Vertigo 08/25/2016  . Nausea 08/01/2016  . CHF (congestive heart failure) ([place]) 08/01/2016  . Atypical chest pain 07/20/2016  . COPD (chronic obstructive pulmonary disease) ([place]) 03/15/2016  . Coronary atherosclerosis of native coronary artery 01/05/2016  . Aortic atherosclerosis ([place]) 01/05/2016  . Bradycardia 01/05/2016  . Anxiety 06/17/2015  . Malignant neoplasm of urinary bladder ([place]) 06/17/2015  . Allergic rhinitis 05/14/2015  . Knee pain, bilateral 03/16/2015  . PAF (paroxysmal atrial fibrillation) ([place]) 08/30/2013  . Acid reflux 08/30/2013  . Tremor, essential 10/22/2012  . Hyperlipidemia 10/22/2012  . HTN (hypertension) 08/01/2012  . Type 2 diabetes mellitus with complication, without long-term current use of insulin ([place]) 08/01/2012  . Benign prostatic hyperplasia with urinary obstruction 09/02/2011    Past Surgical History:  Procedure Laterality Date  . bladder cancer    . CATARACT EXTRACTION    . HERNIA REPAIR    . LEFT HEART CATH AND CORONARY ANGIOGRAPHY N/A 07/11/2016   Procedure: Left Heart Cath and Coronary Angiography;  Surgeon: Wellington Hampshire, MD;  Location: [facility];  Service: Cardiovascular;  Laterality: N/A;  Prior to Admission medications   Medication Sig Start Date End Date Taking? Authorizing Provider  amLODipine (NORVASC) 2.5 MG tablet Take 1 tablet (2.5 mg total) by mouth daily. 06/09/18  Yes Gladstone Lighter, MD  aspirin EC 81 MG tablet Take 81 mg by mouth daily.   Yes [provider]  atorvastatin (LIPITOR) 20 MG tablet Take 1 tablet (20 mg total) by mouth daily. 07/10/18  Yes  Leone Haven, MD  gabapentin (NEURONTIN) 100 MG capsule Take 100 mg by mouth at bedtime.  05/22/18  Yes [provider]  glimepiride (AMARYL) 2 MG tablet Take 2 mg by mouth daily with breakfast. 09/29/18  Yes [provider]  lisinopril (PRINIVIL,ZESTRIL) 20 MG tablet Take 1 tablet (20 mg total) by mouth daily. 06/15/17  Yes Leone Haven, MD  metFORMIN (GLUCOPHAGE) 500 MG tablet TAKE 1 TABLET BY MOUTH TWICE A DAY 05/23/18  Yes Leone Haven, MD  pantoprazole (PROTONIX) 40 MG tablet Take 1 tablet (40 mg total) by mouth daily. 06/15/17  Yes Leone Haven, MD  PARoxetine (PAXIL) 30 MG tablet Take 30 mg by mouth daily. 10/09/18  Yes [provider]  polyethylene glycol (MIRALAX / GLYCOLAX) 17 g packet Take 17 g by mouth daily as needed for mild constipation. 06/08/18  Yes Gladstone Lighter, MD  primidone (MYSOLINE) 50 MG tablet Take 100 mg by mouth 3 (three) times daily.  09/29/18  Yes [provider]  QUEtiapine (SEROQUEL) 50 MG tablet Take 50-100 mg by mouth 3 (three) times daily.  10/06/18  Yes [provider]  tiotropium (SPIRIVA) 18 MCG inhalation capsule Place 18 mcg as needed into inhaler and inhale.    Yes [provider]  traZODone (DESYREL) 100 MG tablet Take 100 mg by mouth at bedtime. 10/06/18  Yes [provider]  acetaminophen (TYLENOL) 325 MG tablet Take 2 tablets (650 mg total) by mouth every 6 (six) hours as needed for mild pain (or Fever >/= 101). 06/22/15   Gouru, Illene Silver, MD  albuterol (PROVENTIL) (2.5 MG/3ML) 0.083% nebulizer solution Take 3 mLs (2.5 mg total) by nebulization every 2 (two) hours as needed for wheezing. 06/08/18   Gladstone Lighter, MD    Allergies Latex  Family History  Problem Relation Age of Onset  . Arthritis Mother   . Dementia Mother   . Cancer Maternal Uncle        prostate  . Cancer Maternal Aunt        breast cancer    Social History Social History   Tobacco Use  . Smoking  status: Former Smoker    Packs/day: 1.50    Years: 40.00    Pack years: 60.00    Quit date: 02/03/2003    Years since quitting: 15.7  . Smokeless tobacco: Never Used  Substance Use Topics  . Alcohol use: Yes    Alcohol/week: 12.0 standard drinks    Types: 12 Standard drinks or equivalent per week    Frequency: Never    Comment: 1 drink with dinner  . Drug use: No    Review of Systems  Review of Systems  Unable to perform ROS: Dementia  Respiratory: Positive for shortness of breath.      ____________________________________________  PHYSICAL EXAM:      VITAL SIGNS: ED Triage Vitals  Enc Vitals Group     BP 10/18/18 1643 (!) 145/85     Pulse Rate 10/18/18 1643 90     Resp 10/18/18 1643 (!) 29  Temp 10/18/18 1643 99.8 F (37.7 C)     Temp Source 10/18/18 1643 Oral     SpO2 10/18/18 1643 97 %     Weight 10/18/18 1645 170 lb (77.1 kg)     Height 10/18/18 1645 5\' 9"  (1.753 m)     Head Circumference --      Peak Flow --      Pain Score --      Pain Loc --      Pain Edu? --      Excl. in [place]? --      Physical Exam Vitals signs and nursing note reviewed.  Constitutional:      General: He is not in acute distress.    Appearance: He is well-developed.     Comments: Elderly, chronically ill appearing  HENT:     Head: Normocephalic and atraumatic.  Eyes:     Conjunctiva/sclera: Conjunctivae normal.  Neck:     Musculoskeletal: Neck supple.  Cardiovascular:     Rate and Rhythm: Normal rate. Rhythm irregular.     Heart sounds: Normal heart sounds.  Pulmonary:     Effort: Pulmonary effort is normal. Tachypnea present. No respiratory distress.     Breath sounds: Decreased breath sounds and wheezing present.  Abdominal:     General: There is no distension.  Skin:    General: Skin is warm.     Capillary Refill: Capillary refill takes less than 2 seconds.     Findings: No rash.  Neurological:     Mental Status: He is alert and oriented to person, place, and time.      Motor: No abnormal muscle tone.       ____________________________________________   LABS (all labs ordered are listed, but only abnormal results are displayed)  Labs Reviewed  CBC WITH DIFFERENTIAL/PLATELET - Abnormal; Notable for the following components:      Result Value   RBC 3.78 (*)    Hemoglobin 12.9 (*)    HCT 38.9 (*)    MCV 102.9 (*)    MCH 34.1 (*)    Platelets 126 (*)    All other components within normal limits  BASIC METABOLIC PANEL - Abnormal; Notable for the following components:   Chloride 116 (*)    BUN 25 (*)    Calcium 8.3 (*)    All other components within normal limits  HEPATIC FUNCTION PANEL - Abnormal; Notable for the following components:   Total Protein 5.9 (*)    Albumin 2.8 (*)    All other components within normal limits  TROPONIN I (HIGH SENSITIVITY) - Abnormal; Notable for the following components:   Troponin I (High Sensitivity) 25 (*)    All other components within normal limits  PROCALCITONIN  URINALYSIS, COMPLETE (UACMP) WITH MICROSCOPIC  TROPONIN I (HIGH SENSITIVITY)    ____________________________________________  EKG: Sinus rhythm, VR 61. Frequent PVCs and occasional pAC. No ST elevations or depressions. ________________________________________  RADIOLOGY All imaging, including plain films, CT scans, and ultrasounds, independently reviewed by me, and interpretations confirmed via formal radiology reads.  ED MD interpretation:   CXR: Clear  Official radiology report(s): Dg Chest Portable 1 View  Result Date: 10/18/2018 CLINICAL DATA:  COVID-19 positive. Diabetes. Hypertension. COPD. Bladder cancer. EXAM: PORTABLE CHEST 1 VIEW COMPARISON:  06/06/2018 FINDINGS: Two frontal views of the chest. Midline trachea. Borderline cardiomegaly. Atherosclerosis in the transverse aorta. No pleural effusion or pneumothorax. Moderate pulmonary interstitial thickening is not significantly changed. Bibasilar opacities, favored to represent  scarring or  subsegmental atelectasis, similar. IMPRESSION: Chronic interstitial thickening, likely related to the clinical history of COPD. No acute superimposed process. Aortic Atherosclerosis (ICD10-I70.0). Electronically Signed   By: Abigail Miyamoto M.D.   On: 10/18/2018 18:33    ____________________________________________  PROCEDURES   Procedure(s) performed (including Critical Care):  Procedures  ____________________________________________  INITIAL IMPRESSION / MDM / [place] / ED COURSE  As part of my medical decision making, I reviewed the following data within the electronic MEDICAL RECORD NUMBER Notes from prior ED visits and [place] Controlled Substance Database      *BAYAN PILAT was evaluated in Emergency Department on 10/18/2018 for the symptoms described in the history of present illness. He was evaluated in the context of the global COVID-19 pandemic, which necessitated consideration that the patient might be at risk for infection with the SARS-CoV-2 virus that causes COVID-19. Institutional protocols and algorithms that pertain to the evaluation of patients at risk for COVID-19 are in a state of rapid change based on information released by regulatory bodies including the CDC and federal and state organizations. These policies and algorithms were followed during the patient's care in the ED.  Some ED evaluations and interventions may be delayed as a result of limited staffing during the pandemic.*   Clinical Course as of Oct 17 2056  Thu Oct 18, 2018  1955 85 yo M here with fever, known COVID+ from [employer]. Pt noted to have frequent ectopy on monitor here, one run of Vtach, with increased WOB and tachypnea but not hypoxia. Febrile on arrival here, received rectal APAP. Trop positive at 25, likely demand but given comorbidities, tachypnea, runs of VTach on tele, will plan to obs. Pt also with diffuse wheezing on exam with h/o COPD - steroids, inh given.   [CI]   [facility] full. [facility] full as well for tele beds. Will plan to obs in ED pending bed availability.   [CI]    Clinical Course User Index [CI] Duffy Bruce, MD    Medical Decision Making:  As above. COVID+ with persistent tachypnea and wheezing, needs steroids/nebs but had brief episodes of NSVT here and frequent ectopy. DNR but would be amenable to antiarrhythmic/medical management. [facility] full. Admit for obs.  NOTE: COVID results requested from [facility] for confirmation, pending at time of admission.  ____________________________________________  FINAL CLINICAL IMPRESSION(S) / ED DIAGNOSES  Final diagnoses:  COVID-19  Nonsustained ventricular tachycardia (HCC)  Troponin level elevated     MEDICATIONS GIVEN DURING THIS VISIT:  Medications  albuterol (VENTOLIN HFA) 108 (90 Base) MCG/ACT inhaler 2 puff (has no administration in time range)  acetaminophen (TYLENOL) suppository 650 mg (650 mg Rectal Given 10/18/18 1723)  methylPREDNISolone sodium succinate (SOLU-MEDROL) 125 mg/2 mL injection 125 mg (125 mg Intravenous Given 10/18/18 1855)     ED Discharge Orders    None       Note:  This document was prepared using Dragon voice recognition software and may include unintentional dictation errors.   Duffy Bruce, MD 10/18/18 2059    Duffy Bruce, MD 10/18/18 [phone]

## 2018-10-18 NOTE — ED Triage Notes (Addendum)
Pt from [facility] via [facility] for chief complaint of COVID positive. Per EMS, family requested pt to come to ER. Pt in NAD at this time. Per EMS, pt has dementia.

## 2018-10-18 NOTE — ED Notes (Signed)
Two RNs to bedside to change pts depends and bedding. Pt calm at this time and continues to lay on falls mat. Bed locked and in lowest position with rails up.

## 2018-10-18 NOTE — H&P (Signed)
[place] [facility] [place] NAME: Chase Simmons    MR#:  [id number]  DATE OF BIRTH:  Mar 09, 1933  DATE OF ADMISSION:  10/18/2018  PRIMARY CARE PHYSICIAN: Leone Haven, MD   REQUESTING/REFERRING PHYSICIAN: Dene Gentry, MD  CHIEF COMPLAINT:   Chief Complaint  Patient presents with  . COVID +    HISTORY OF PRESENT ILLNESS:  Chase Simmons  is a 85 y.o. male who presents with chief complaint as above.  Patient presents to the ED with complaint of worsening shortness of breath.  He is known Covid positive.  He is not requiring supplemental oxygen, and his chest x-ray looks okay.  However, he is more short of breath than he has been and hospitalist were called for observation and treatment.  PAST MEDICAL HISTORY:   Past Medical History:  Diagnosis Date  . Allergy   . Bladder cancer ([place])    a. followed by Dr. Jacqlyn Larsen  . Chronic combined systolic (congestive) and diastolic (congestive) heart failure ([place])    a. 07/2016 Echo: EF 30-35%, sev mid-apicalanteroseptal, ant, inf HK, Gr1 DD.  Marland Kitchen COPD (chronic obstructive pulmonary disease) ([place])   . Hyperlipidemia   . Hypertension   . NICM (nonischemic cardiomyopathy) ([place])    a. 07/2016 Echo: EF 30-35%, sev mid-apicalanteroseptal, ant, inf HK, Gr1 DD, mild MR, mildly dil LA, PASP 57mmHg - ? stress induced CM.  Marland Kitchen Nonobstructive Coronary atherosclerosis    a. 07/2016 Cath: LM nl, LAD nl, D1 40ost, LCX 41m, RCA nl, EF 25-30%.  Marland Kitchen PAF (paroxysmal atrial fibrillation) ([place])    a. Dx 08/2013. CHA2DS2VASc = 6-->no OAC 2/2 h/o falls and nocompliance.  . Tremor, essential    sees [facility] neurologist  . Type 2 diabetes mellitus with complication, without long-term current use of insulin ([place]) 08/01/2012  . Urinary incontinence      PAST SURGICAL HISTORY:   Past Surgical History:  Procedure Laterality Date  . bladder cancer    . CATARACT EXTRACTION    . HERNIA REPAIR    . LEFT HEART CATH AND CORONARY  ANGIOGRAPHY N/A 07/11/2016   Procedure: Left Heart Cath and Coronary Angiography;  Surgeon: Wellington Hampshire, MD;  Location: [facility];  Service: Cardiovascular;  Laterality: N/A;     SOCIAL HISTORY:   Social History   Tobacco Use  . Smoking status: Former Smoker    Packs/day: 1.50    Years: 40.00    Pack years: 60.00    Quit date: 02/03/2003    Years since quitting: 15.7  . Smokeless tobacco: Never Used  Substance Use Topics  . Alcohol use: Yes    Alcohol/week: 12.0 standard drinks    Types: 12 Standard drinks or equivalent per week    Frequency: Never    Comment: 1 drink with dinner     FAMILY HISTORY:   Family History  Problem Relation Age of Onset  . Arthritis Mother   . Dementia Mother   . Cancer Maternal Uncle        prostate  . Cancer Maternal Aunt        breast cancer     DRUG ALLERGIES:   Allergies  Allergen Reactions  . Latex Itching    MEDICATIONS AT HOME:   Prior to Admission medications   Medication Sig Start Date End Date Taking? Authorizing Provider  amLODipine (NORVASC) 2.5 MG tablet Take 1 tablet (2.5 mg total) by mouth daily. 06/09/18  Yes Gladstone Lighter, MD  aspirin EC 81 MG tablet Take 81 mg by mouth daily.   Yes [provider]  atorvastatin (LIPITOR) 20 MG tablet Take 1 tablet (20 mg total) by mouth daily. 07/10/18  Yes Leone Haven, MD  gabapentin (NEURONTIN) 100 MG capsule Take 100 mg by mouth at bedtime.  05/22/18  Yes [provider]  glimepiride (AMARYL) 2 MG tablet Take 2 mg by mouth daily with breakfast. 09/29/18  Yes [provider]  lisinopril (PRINIVIL,ZESTRIL) 20 MG tablet Take 1 tablet (20 mg total) by mouth daily. 06/15/17  Yes Leone Haven, MD  metFORMIN (GLUCOPHAGE) 500 MG tablet TAKE 1 TABLET BY MOUTH TWICE A DAY 05/23/18  Yes Leone Haven, MD  pantoprazole (PROTONIX) 40 MG tablet Take 1 tablet (40 mg total) by mouth daily. 06/15/17  Yes Leone Haven, MD  PARoxetine  (PAXIL) 30 MG tablet Take 30 mg by mouth daily. 10/09/18  Yes [provider]  polyethylene glycol (MIRALAX / GLYCOLAX) 17 g packet Take 17 g by mouth daily as needed for mild constipation. 06/08/18  Yes Gladstone Lighter, MD  primidone (MYSOLINE) 50 MG tablet Take 100 mg by mouth 3 (three) times daily.  09/29/18  Yes [provider]  QUEtiapine (SEROQUEL) 50 MG tablet Take 50-100 mg by mouth 3 (three) times daily.  10/06/18  Yes [provider]  tiotropium (SPIRIVA) 18 MCG inhalation capsule Place 18 mcg as needed into inhaler and inhale.    Yes [provider]  traZODone (DESYREL) 100 MG tablet Take 100 mg by mouth at bedtime. 10/06/18  Yes [provider]  acetaminophen (TYLENOL) 325 MG tablet Take 2 tablets (650 mg total) by mouth every 6 (six) hours as needed for mild pain (or Fever >/= 101). 06/22/15   Gouru, Illene Silver, MD  albuterol (PROVENTIL) (2.5 MG/3ML) 0.083% nebulizer solution Take 3 mLs (2.5 mg total) by nebulization every 2 (two) hours as needed for wheezing. 06/08/18   Gladstone Lighter, MD    REVIEW OF SYSTEMS:  Review of Systems  Constitutional: Negative for chills, fever, malaise/fatigue and weight loss.  HENT: Negative for ear pain, hearing loss and tinnitus.   Eyes: Negative for blurred vision, double vision, pain and redness.  Respiratory: Positive for cough and shortness of breath. Negative for hemoptysis.   Cardiovascular: Negative for chest pain, palpitations, orthopnea and leg swelling.  Gastrointestinal: Negative for abdominal pain, constipation, diarrhea, nausea and vomiting.  Genitourinary: Negative for dysuria, frequency and hematuria.  Musculoskeletal: Negative for back pain, joint pain and neck pain.  Skin:       No acne, rash, or lesions  Neurological: Negative for dizziness, tremors, focal weakness and weakness.  Endo/Heme/Allergies: Negative for polydipsia. Does not bruise/bleed easily.  Psychiatric/Behavioral: Negative for  depression. The patient is not nervous/anxious and does not have insomnia.      VITAL SIGNS:   Vitals:   10/18/18 2130 10/18/18 2200 10/18/18 2230 10/18/18 2256  BP:  127/74 134/71 134/71  Pulse: 69 74  66  Resp: (!) 22 (!) 21 (!) 21 19  Temp:      TempSrc:      SpO2: 100% 100%  100%  Weight:      Height:       Wt Readings from Last 3 Encounters:  10/18/18 77.1 kg  06/06/18 90.7 kg  02/02/18 102.8 kg    PHYSICAL EXAMINATION:  Physical Exam  Vitals reviewed. Constitutional: He is oriented to person, place, and time. He appears well-developed and well-nourished. No distress.  HENT:  Head: Normocephalic and atraumatic.  Mouth/Throat: Oropharynx is clear and moist.  Eyes: Pupils are equal, round, and reactive to light. Conjunctivae and EOM are normal. No scleral icterus.  Neck: Normal range of motion. Neck supple. No JVD present. No thyromegaly present.  Cardiovascular: Normal rate, regular rhythm and intact distal pulses. Exam reveals no gallop and no friction rub.  No murmur heard. Respiratory: Effort normal and breath sounds normal. No respiratory distress. He has no wheezes. He has no rales.  GI: Soft. Bowel sounds are normal. He exhibits no distension. There is no abdominal tenderness.  Musculoskeletal: Normal range of motion.        General: No edema.     Comments: No arthritis, no gout  Lymphadenopathy:    He has no cervical adenopathy.  Neurological: He is alert and oriented to person, place, and time. No cranial nerve deficit.  No dysarthria, no aphasia  Skin: Skin is warm and dry. No rash noted. No erythema.  Psychiatric: He has a normal mood and affect. His behavior is normal. Judgment and thought content normal.    LABORATORY PANEL:   CBC Recent Labs  Lab 10/18/18 1721  WBC 4.3  HGB 12.9*  HCT 38.9*  PLT 126*   ------------------------------------------------------------------------------------------------------------------  Chemistries  Recent Labs   Lab 10/18/18 1721 10/18/18 1847  NA 145  --   K 3.6  --   CL 116*  --   CO2 24  --   GLUCOSE 80  --   BUN 25*  --   CREATININE 0.83  --   CALCIUM 8.3*  --   AST  --  21  ALT  --  15  ALKPHOS  --  54  BILITOT  --  0.5   ------------------------------------------------------------------------------------------------------------------  Cardiac Enzymes No results for input(s): TROPONINI in the last 168 hours. ------------------------------------------------------------------------------------------------------------------  RADIOLOGY:  Dg Chest Portable 1 View  Result Date: 10/18/2018 CLINICAL DATA:  COVID-19 positive. Diabetes. Hypertension. COPD. Bladder cancer. EXAM: PORTABLE CHEST 1 VIEW COMPARISON:  06/06/2018 FINDINGS: Two frontal views of the chest. Midline trachea. Borderline cardiomegaly. Atherosclerosis in the transverse aorta. No pleural effusion or pneumothorax. Moderate pulmonary interstitial thickening is not significantly changed. Bibasilar opacities, favored to represent scarring or subsegmental atelectasis, similar. IMPRESSION: Chronic interstitial thickening, likely related to the clinical history of COPD. No acute superimposed process. Aortic Atherosclerosis (ICD10-I70.0). Electronically Signed   By: Abigail Miyamoto M.D.   On: 10/18/2018 18:33    EKG:   Orders placed or performed during the hospital encounter of 10/18/18  . ED EKG  . ED EKG  . EKG 12-Lead  . EKG 12-Lead    IMPRESSION AND PLAN:  Principal Problem:   COVID-19 virus infection -patient is not requiring supplemental oxygen his chest x-ray does not show infiltrates.  He is subjectively more short of breath.  Have a history of COPD, so we are administering some steroids and providing supportive treatment with inhalers and antitussive. Active Problems:   HTN (hypertension) -home dose antihypertensives   Type 2 diabetes mellitus with complication, without long-term current use of insulin (HCC) -sliding  scale insulin coverage   PAF (paroxysmal atrial fibrillation) (HCC) -home dose rate controlling medications   Coronary atherosclerosis of native coronary artery -continue home meds   COPD (chronic obstructive pulmonary disease) ([place]) -continue home meds, other treatment as above   Hyperlipidemia -home dose antilipid   Acid reflux -home dose PPI  Chart review performed and case discussed with ED provider. Labs, imaging and/or ECG  reviewed by provider and discussed with patient/family. Management plans discussed with the patient and/or family.  COVID-19 status: Tested positive     DVT PROPHYLAXIS: SubQ lovenox   GI PROPHYLAXIS:  PPI   ADMISSION STATUS: Observation  CODE STATUS: DNR Code Status History    Date Active Date Inactive Code Status Order ID Comments User Context   06/06/2018 2015 06/13/2018 1644 DNR [id number]  Hillary Bow, MD ED   08/25/2016 0304 08/26/2016 2304 Full Code [id number]  Harrie Foreman, MD Inpatient   07/21/2016 0049 07/25/2016 2010 Full Code [id number]  Watson, [place], DO Inpatient   07/10/2016 0934 07/13/2016 1719 Full Code [id number]  Saundra Shelling, MD Inpatient   06/19/2015 0021 06/23/2015 0047 Full Code [id number]  Alesia Richards, MD ED   06/04/2015 2013 06/08/2015 1833 Full Code [id number]  Loletha Grayer, MD ED   Advance Care Planning Activity    Questions for Most Recent Historical Code Status (Order [id number])    Question Answer Comment   In the event of cardiac or respiratory ARREST Do not call a "code blue"    In the event of cardiac or respiratory ARREST Do not perform Intubation, CPR, defibrillation or ACLS    In the event of cardiac or respiratory ARREST Use medication by any route, position, wound care, and other measures to relive pain and suffering. May use oxygen, suction and manual treatment of airway obstruction as needed for comfort.       TOTAL TIME TAKING CARE OF THIS PATIENT: 40 minutes.   This patient was evaluated in the context of the  global COVID-19 pandemic, which necessitated consideration that the patient might be at risk for infection with the SARS-CoV-2 virus that causes COVID-19. Institutional protocols and algorithms that pertain to the evaluation of patients at risk for COVID-19 are in a state of rapid change based on information released by regulatory bodies including the CDC and federal and state organizations. These policies and algorithms were followed to the best of this provider's knowledge to date during the patient's care at this facility.  Ethlyn Daniels 10/18/2018, 11:53 PM  [facility]  Office  [phone]  CC: Primary care physician; Leone Haven, MD  Note:  This document was prepared using Dragon voice recognition software and may include unintentional dictation errors.

## 2018-10-19 DIAGNOSIS — K219 Gastro-esophageal reflux disease without esophagitis: Secondary | ICD-10-CM | POA: Diagnosis present

## 2018-10-19 DIAGNOSIS — I2510 Atherosclerotic heart disease of native coronary artery without angina pectoris: Secondary | ICD-10-CM | POA: Diagnosis present

## 2018-10-19 DIAGNOSIS — Z66 Do not resuscitate: Secondary | ICD-10-CM | POA: Diagnosis not present

## 2018-10-19 DIAGNOSIS — I472 Ventricular tachycardia: Secondary | ICD-10-CM | POA: Diagnosis present

## 2018-10-19 DIAGNOSIS — Z91040 Latex allergy status: Secondary | ICD-10-CM | POA: Diagnosis not present

## 2018-10-19 DIAGNOSIS — U071 COVID-19: Secondary | ICD-10-CM | POA: Diagnosis not present

## 2018-10-19 DIAGNOSIS — E785 Hyperlipidemia, unspecified: Secondary | ICD-10-CM | POA: Diagnosis not present

## 2018-10-19 DIAGNOSIS — Z7982 Long term (current) use of aspirin: Secondary | ICD-10-CM | POA: Diagnosis not present

## 2018-10-19 DIAGNOSIS — Z79899 Other long term (current) drug therapy: Secondary | ICD-10-CM | POA: Diagnosis not present

## 2018-10-19 DIAGNOSIS — E1169 Type 2 diabetes mellitus with other specified complication: Secondary | ICD-10-CM | POA: Diagnosis not present

## 2018-10-19 DIAGNOSIS — R0602 Shortness of breath: Secondary | ICD-10-CM | POA: Diagnosis present

## 2018-10-19 DIAGNOSIS — I480 Paroxysmal atrial fibrillation: Secondary | ICD-10-CM | POA: Diagnosis not present

## 2018-10-19 DIAGNOSIS — J449 Chronic obstructive pulmonary disease, unspecified: Secondary | ICD-10-CM | POA: Diagnosis present

## 2018-10-19 DIAGNOSIS — I10 Essential (primary) hypertension: Secondary | ICD-10-CM | POA: Diagnosis not present

## 2018-10-19 DIAGNOSIS — E119 Type 2 diabetes mellitus without complications: Secondary | ICD-10-CM | POA: Diagnosis not present

## 2018-10-19 DIAGNOSIS — I428 Other cardiomyopathies: Secondary | ICD-10-CM | POA: Diagnosis not present

## 2018-10-19 DIAGNOSIS — Z87891 Personal history of nicotine dependence: Secondary | ICD-10-CM | POA: Diagnosis not present

## 2018-10-19 DIAGNOSIS — Z8551 Personal history of malignant neoplasm of bladder: Secondary | ICD-10-CM | POA: Diagnosis not present

## 2018-10-19 DIAGNOSIS — Z7984 Long term (current) use of oral hypoglycemic drugs: Secondary | ICD-10-CM | POA: Diagnosis not present

## 2018-10-19 LAB — BASIC METABOLIC PANEL
Anion gap: 7 (ref 5–15)
BUN: 26 mg/dL — ABNORMAL HIGH (ref 8–23)
CO2: 26 mmol/L (ref 22–32)
Calcium: 8.4 mg/dL — ABNORMAL LOW (ref 8.9–10.3)
Chloride: 114 mmol/L — ABNORMAL HIGH (ref 98–111)
Creatinine, Ser: 1 mg/dL (ref 0.61–1.24)
GFR calc Af Amer: >60 mL/min (ref >60)
GFR calc non Af Amer: >60 mL/min (ref >60)
Glucose, Bld: 228 mg/dL — ABNORMAL HIGH (ref 70–99)
Potassium: 4.3 mmol/L (ref 3.5–5.1)
Sodium: 147 mmol/L — ABNORMAL HIGH (ref 135–145)

## 2018-10-19 LAB — CBC
HCT: 37.1 % — ABNORMAL LOW (ref 39.0–52.0)
Hemoglobin: 12.2 g/dL — ABNORMAL LOW (ref 13.0–17.0)
MCH: 33.7 pg (ref 26.0–34.0)
MCHC: 32.9 g/dL (ref 30.0–36.0)
MCV: 102.5 fL — ABNORMAL HIGH (ref 80.0–100.0)
Platelets: 125 10*3/uL — ABNORMAL LOW (ref 150–400)
RBC: 3.62 MIL/uL — ABNORMAL LOW (ref 4.22–5.81)
RDW: 13.1 % (ref 11.5–15.5)
WBC: 2 10*3/uL — ABNORMAL LOW (ref 4.0–10.5)
nRBC: 0 % (ref 0.0–0.2)

## 2018-10-19 MED ORDER — ENOXAPARIN SODIUM 40 MG/0.4ML ~~LOC~~ SOLN
40.0000 mg | SUBCUTANEOUS | Status: DC
  Administered 2018-10-19: 40 mg via SUBCUTANEOUS
  Filled 2018-10-19: qty 0.4

## 2018-10-19 MED ORDER — PRIMIDONE 50 MG PO TABS
100.0000 mg | ORAL_TABLET | Freq: Three times a day (TID) | ORAL | Status: DC
  Administered 2018-10-19 (×2): 100 mg via ORAL
  Filled 2018-10-19 (×5): qty 2

## 2018-10-19 MED ORDER — PAROXETINE HCL 30 MG PO TABS
30.0000 mg | ORAL_TABLET | Freq: Every day | ORAL | Status: DC
  Administered 2018-10-19: 11:00:00 30 mg via ORAL
  Filled 2018-10-19 (×2): qty 1

## 2018-10-19 MED ORDER — ONDANSETRON HCL 4 MG PO TABS: 4.0000 mg | ORAL_TABLET | Freq: Four times a day (QID) | ORAL | Status: DC | PRN

## 2018-10-19 MED ORDER — GABAPENTIN 100 MG PO CAPS
100.0000 mg | ORAL_CAPSULE | Freq: Every day | ORAL | Status: DC
  Administered 2018-10-19: 100 mg via ORAL
  Filled 2018-10-19 (×2): qty 1

## 2018-10-19 MED ORDER — TIOTROPIUM BROMIDE MONOHYDRATE 18 MCG IN CAPS
18.0000 ug | ORAL_CAPSULE | Freq: Every day | RESPIRATORY_TRACT | Status: DC
  Administered 2018-10-19: 18 ug via RESPIRATORY_TRACT
  Filled 2018-10-19: qty 5

## 2018-10-19 MED ORDER — ACETAMINOPHEN 650 MG RE SUPP: 650.0000 mg | Freq: Four times a day (QID) | RECTAL | Status: DC | PRN

## 2018-10-19 MED ORDER — ASPIRIN EC 81 MG PO TBEC
81.0000 mg | DELAYED_RELEASE_TABLET | Freq: Every day | ORAL | Status: DC
  Administered 2018-10-19: 81 mg via ORAL
  Filled 2018-10-19: qty 1

## 2018-10-19 MED ORDER — DEXAMETHASONE 4 MG PO TABS
6.0000 mg | ORAL_TABLET | Freq: Every day | ORAL | Status: DC
  Administered 2018-10-19: 6 mg via ORAL
  Filled 2018-10-19 (×2): qty 1.5

## 2018-10-19 MED ORDER — PANTOPRAZOLE SODIUM 40 MG PO TBEC
40.0000 mg | DELAYED_RELEASE_TABLET | Freq: Every day | ORAL | Status: DC
  Administered 2018-10-19: 40 mg via ORAL
  Filled 2018-10-19: qty 1

## 2018-10-19 MED ORDER — IPRATROPIUM-ALBUTEROL 20-100 MCG/ACT IN AERS
1.0000 | INHALATION_SPRAY | Freq: Four times a day (QID) | RESPIRATORY_TRACT | Status: DC | PRN
  Filled 2018-10-19: qty 4

## 2018-10-19 MED ORDER — TRAZODONE HCL 100 MG PO TABS
100.0000 mg | ORAL_TABLET | Freq: Every day | ORAL | Status: DC
  Administered 2018-10-19: 22:00:00 100 mg via ORAL
  Filled 2018-10-19: qty 1

## 2018-10-19 MED ORDER — ACETAMINOPHEN 325 MG PO TABS: 650.0000 mg | ORAL_TABLET | Freq: Four times a day (QID) | ORAL | Status: DC | PRN

## 2018-10-19 MED ORDER — ATORVASTATIN CALCIUM 20 MG PO TABS: 20.0000 mg | ORAL_TABLET | Freq: Every day | ORAL | Status: DC

## 2018-10-19 MED ORDER — ONDANSETRON HCL 4 MG/2ML IJ SOLN: 4.0000 mg | Freq: Four times a day (QID) | INTRAMUSCULAR | Status: DC | PRN

## 2018-10-19 NOTE — ED Notes (Signed)
Administered medications with apple sauce pt tolerated well

## 2018-10-19 NOTE — ED Notes (Signed)
This tech fed pt dinner tray. Pt ate all of grilled chicken sandwich and half of one cookie. Pt seemed more confused and when eating cookie pt would chew and then put hand in mouth to take cookie out. Pt drank three cups of water and one cup of grape juice. Pt had episode of urine incontinence. Pt cleaned up and chucks pad was replaced under pt.

## 2018-10-19 NOTE — ED Notes (Signed)
Received report from Jennifer, RN

## 2018-10-19 NOTE — ED Notes (Signed)
Checked on pt. Bedding clean at this time. No episodes of incontinence.

## 2018-10-19 NOTE — TOC Initial Note (Signed)
Transition of Care [facility]) - Initial/Assessment Note    Patient Details  Name: Chase Simmons MRN: [id number] Date of Birth: 07/28/1933  Transition of Care [facility]) CM/SW Contact:    Katrina Stack, RN Phone Number: 10/19/2018, 11:13 AM  Clinical Narrative:                Patient sent to the ED from [facility] for positive covid test. History of dementia.  He did admit to shortness of breath. No fever, is not requiring supplemental oxygen and no infiltrate on chest xray.  Patient is under a long term plan of care at [facility].  He has been placed in observation.  CM reviewed the observation notice with son [name] over the phone.  [name] requests that his copy of the notice be placed in the paper chart rather than patient's room so he can obtain if visits are restricted. Patient is on "the waiting list" for [facility] but it is doubtful that a bed will become available. Left voicemail for Otila Kluver at [facility] regarding return. FL2 done         Patient Goals and CMS Choice        Expected Discharge Plan and Services                                                Prior Living Arrangements/Services                       Activities of Daily Living      Permission Sought/Granted                  Emotional Assessment              Admission diagnosis:  COVID + Patient Active Problem List   Diagnosis Date Noted  . COVID-19 virus infection 10/18/2018  . Weakness 06/07/2018  . Skin tear of elbow without complication [id number]  . Lactic acidosis 06/06/2018  . Depression 04/20/2018  . Altered mental status   . Frequency of urination 03/01/2018  . Confusion 03/01/2018  . Bilateral leg edema 11/08/2016  . Vertigo 08/25/2016  . Nausea 08/01/2016  . CHF (congestive heart failure) ([place]) 08/01/2016  . Atypical chest pain 07/20/2016  . COPD (chronic obstructive pulmonary disease) ([place]) 03/15/2016  . Coronary atherosclerosis of native coronary artery 01/05/2016   . Aortic atherosclerosis ([place]) 01/05/2016  . Bradycardia 01/05/2016  . Anxiety 06/17/2015  . Malignant neoplasm of urinary bladder ([place]) 06/17/2015  . Allergic rhinitis 05/14/2015  . Knee pain, bilateral 03/16/2015  . PAF (paroxysmal atrial fibrillation) ([place]) 08/30/2013  . Acid reflux 08/30/2013  . Tremor, essential 10/22/2012  . Hyperlipidemia 10/22/2012  . HTN (hypertension) 08/01/2012  . Type 2 diabetes mellitus with complication, without long-term current use of insulin ([place]) 08/01/2012  . Benign prostatic hyperplasia with urinary obstruction 09/02/2011   PCP:  Leone Haven, MD Pharmacy:   [id number] - [place], [place] [address] [place] [address] Phone: [phone] Fax: [phone]     Social Determinants of Health (SDOH) Interventions    Readmission Risk Interventions Readmission Risk Prevention Plan 06/07/2018  Transportation Screening Complete  PCP or Specialist Appt within 3-5 Days Complete  HRI or [place] Complete  Social Work Consult for [place] Planning/Counseling Not Complete  Medication Review (RN Care  Manager) Complete  Some recent data might be hidden

## 2018-10-19 NOTE — ED Notes (Signed)
Loma Sender called RN, this RN informed pt is going to be transferred.

## 2018-10-19 NOTE — ED Notes (Signed)
Pt had large bowel movement and episode of urine incontinence. Pt cleaned up and new bedding put on pts bed.

## 2018-10-19 NOTE — ED Notes (Signed)
Pt fed lunch tray by this tech. When entering room pt had had episode of urine incontinence. Pt cleaned up and new bedding put on bed. Pt ate all of grilled [place] sandwich and orange. Pt drank 4 cups of water.

## 2018-10-19 NOTE — ED Notes (Signed)
This Rn called pt's daughter Tyler Blech [phone] informed pt is going to be transferred to [facility], pt's daughter very thankful to RN for the update.

## 2018-10-19 NOTE — Care Management Obs Status (Signed)
MEDICARE OBSERVATION STATUS NOTIFICATION   Patient Details  Name: Chase Simmons MRN: [id number] Date of Birth: [birth year]   Medicare Observation Status Notification Given:  Yes    Katrina Stack, RN 10/19/2018, 11:12 AM

## 2018-10-19 NOTE — ED Notes (Signed)
This RN attempted to call patient's son and daughter.  No answer.  I did not leave a voicemail.  Will attempt again at a later time.

## 2018-10-19 NOTE — Discharge Summary (Signed)
[place] [facility] [place] NAME: Chase Simmons    MR#:  [id number]  DATE OF BIRTH:  [birth year]  SUBJECTIVE:  CHIEF COMPLAINT:   Chief Complaint  Patient presents with  . COVID  agitated, completed naked. Bowel incontinence, covered in stool REVIEW OF SYSTEMS:  ROSunable to obtain due to mental status DRUG ALLERGIES:   Allergies  Allergen Reactions  . Latex Itching   VITALS:  Blood pressure (!) 153/99, pulse 83, temperature 99.8 F (37.7 C), temperature source Rectal, resp. rate 20, height 5\' 9"  (1.753 m), weight 77.1 kg, SpO2 100 %. PHYSICAL EXAMINATION:  Physical Exam  Constitutional:  He appears well-developed and well-nourished. Naked and covered in stool - which may be his reason for agitation HENT:  Head: Normocephalic and atraumatic.  Mouth/Throat: Oropharynx is clear and moist.  Eyes: Pupils are equal, round, and reactive to light. Conjunctivae and EOM are normal. No scleral icterus.  Neck: Normal range of motion. Neck supple. No JVD present. No thyromegaly present.  Cardiovascular: Normal rate, regular rhythm and intact distal pulses. Exam reveals no gallop and no friction rub.  No murmur heard. Respiratory: Effort normal and breath sounds normal. No respiratory distress. He has no wheezes. He has no rales.  GI: Soft. Bowel sounds are normal. He exhibits no distension. There is no abdominal tenderness.  Musculoskeletal: Normal range of motion.        General: No edema.  Lymphadenopathy:    He has no cervical adenopathy.  Neurological: He is confused. No cranial nerve deficit.  Skin: Skin is warm and dry. No rash noted. No erythema.  Psychiatric: seems agitated LABORATORY PANEL:  Male CBC Recent Labs  Lab 10/19/18 0510  WBC 2.0*  HGB 12.2*  HCT 37.1*  PLT 125*   ------------------------------------------------------------------------------------------------------------------ Chemistries  Recent Labs  Lab 10/18/18  1847 10/19/18 0510  NA  --  147*  K  --  4.3  CL  --  114*  CO2  --  26  GLUCOSE  --  228*  BUN  --  26*  CREATININE  --  1.00  CALCIUM  --  8.4*  AST 21  --   ALT 15  --   ALKPHOS 54  --   BILITOT 0.5  --    RADIOLOGY:  No results found. ASSESSMENT AND PLAN:   COVID-19 virus infection -patient is not requiring supplemental oxygen his chest x-ray does not show infiltrates.  He is subjectively more short of breath.  Have a history of COPD,  - continue steroids and providing supportive treatment with inhalers and antitussive.   HTN (hypertension) -home dose antihypertensives   Type 2 diabetes mellitus with complication, without long-term current use of insulin (HCC) -sliding scale insulin coverage   PAF (paroxysmal atrial fibrillation) (HCC) -home dose rate controlling medications   Coronary atherosclerosis of native coronary artery -continue home meds   COPD (chronic obstructive pulmonary disease) ([place]) -continue home meds, other treatment as above   Hyperlipidemia -home dose antilipid   Acid reflux -home dose PPI     All the records are reviewed and case discussed with Care Management/Social Worker. Management plans discussed with the patient, nursing and they are in agreement.  CODE STATUS: DNR  TOTAL TIME TAKING CARE OF THIS PATIENT: 35 minutes.   More than 50% of the time was spent in counseling/coordination of care: YES  On waiting list for [facility] transfer   Max Sane M.D on 10/19/2018 at 6:11 PM  Between 7am to 6pm - Pager - [phone]  After 6pm go to [email or web address] - [employer] [facility]  Office  [phone]  CC: Primary care physician; Leone Haven, MD  Note: This dictation was prepared with Dragon dictation along with smaller phrase technology. Any transcriptional errors that result from this process are unintentional.

## 2018-10-19 NOTE — ED Notes (Signed)
This RN and Forest Gleason provided perineal care to pt and changed incontinent pad. Provided a blanket to pt

## 2018-10-19 NOTE — ED Notes (Signed)
Checked on pt. No episodes on incontinence. Pt clean and dry at this time. Pt repositioned in bed.

## 2018-10-19 NOTE — ED Notes (Signed)
Sacral pad placed to patient's sacram as preventitive measure against pressure ulcers.  Patient turned from his right side to his left side.  Pillow placed between patient's legs.

## 2018-10-19 NOTE — ED Notes (Signed)
Pt fed breakfast by this tech. Pt ate all of egg biscuit, grapes, 2 cups of water and 1 cup of apple juice.

## 2018-10-19 NOTE — ED Notes (Signed)
ED TO INPATIENT HANDOFF REPORT  ED Nurse Name and Phone #: Torrie Mayers, RN [phone]  S Name/Age/Gender Chase Simmons 85 y.o. male Room/Bed: ED31A/ED31A  Code Status   Code Status: DNR  Home/SNF/Other Nursing Home [facility]  Patient oriented to: None  Is this baseline? Yes   Triage Complete: Triage complete  Chief Complaint COVID   Triage Note Pt from [place] resources via [facility] for chief complaint of COVID positive. Per EMS, family requested pt to come to ER. Pt in NAD at this time. Per EMS, pt has dementia.      Allergies Allergies  Allergen Reactions  . Latex Itching    Level of Care/Admitting Diagnosis ED Disposition    ED Disposition Condition [facility] Area: [place] [[id number]]  Level of Care: Telemetry [5]  Covid Evaluation: Confirmed COVID Positive  Diagnosis: COVID-19 [id number]  Admitting Physician: Max Sane [id number]  Attending Physician: Max Sane [id number]  Estimated length of stay: past midnight tomorrow  Certification:: I certify this patient will need inpatient services for at least 2 midnights  PT Class (Do Not Modify): Inpatient [101]  PT Acc Code (Do Not Modify): Private [1]       B Medical/Surgery History Past Medical History:  Diagnosis Date  . Allergy   . Bladder cancer ([place])    a. followed by Dr. Jacqlyn Larsen  . Chronic combined systolic (congestive) and diastolic (congestive) heart failure ([place])    a. 07/2016 Echo: EF 30-35%, sev mid-apicalanteroseptal, ant, inf HK, Gr1 DD.  Marland Kitchen COPD (chronic obstructive pulmonary disease) ([place])   . Hyperlipidemia   . Hypertension   . NICM (nonischemic cardiomyopathy) ([place])    a. 07/2016 Echo: EF 30-35%, sev mid-apicalanteroseptal, ant, inf HK, Gr1 DD, mild MR, mildly dil LA, PASP 22mmHg - ? stress induced CM.  Marland Kitchen Nonobstructive Coronary atherosclerosis    a. 07/2016 Cath: LM nl, LAD nl, D1 40ost, LCX 20m, RCA nl, EF 25-30%.  Marland Kitchen PAF (paroxysmal atrial  fibrillation) ([place])    a. Dx 08/2013. CHA2DS2VASc = 6-->no OAC 2/2 h/o falls and nocompliance.  . Tremor, essential    sees [facility] neurologist  . Type 2 diabetes mellitus with complication, without long-term current use of insulin ([place]) 08/01/2012  . Urinary incontinence    Past Surgical History:  Procedure Laterality Date  . bladder cancer    . CATARACT EXTRACTION    . HERNIA REPAIR    . LEFT HEART CATH AND CORONARY ANGIOGRAPHY N/A 07/11/2016   Procedure: Left Heart Cath and Coronary Angiography;  Surgeon: Wellington Hampshire, MD;  Location: [facility];  Service: Cardiovascular;  Laterality: N/A;     A IV Location/Drains/Wounds Patient Lines/Drains/Airways Status   Active Line/Drains/Airways    Name:   Placement date:   Placement time:   Site:   Days:   Peripheral IV 10/18/18 Right Forearm   10/18/18    1732    Forearm   1   Wound / Incision (Open or Dehisced) 06/09/18 Other (Comment) Hand Posterior;Right skin tear   06/09/18    0730    Hand   132   Wound / Incision (Open or Dehisced) 06/07/18 Other (Comment) Elbow Left;Posterior skin tear   06/07/18    1900    Elbow   134          Intake/Output Last 24 hours No intake or output data in the 24 hours ending 10/19/18 2139  Labs/Imaging Results for orders placed or performed  during the hospital encounter of 10/18/18 (from the past 48 hour(s))  CBC with Differential     Status: Abnormal   Collection Time: 10/18/18  5:21 PM  Result Value Ref Range   WBC 4.3 4.0 - 10.5 K/uL   RBC 3.78 (L) 4.22 - 5.81 MIL/uL   Hemoglobin 12.9 (L) 13.0 - 17.0 g/dL   HCT 38.9 (L) 39.0 - 52.0 %   MCV 102.9 (H) 80.0 - 100.0 fL   MCH 34.1 (H) 26.0 - 34.0 pg   MCHC 33.2 30.0 - 36.0 g/dL   RDW 13.1 11.5 - 15.5 %   Platelets 126 (L) 150 - 400 K/uL   nRBC 0.0 0.0 - 0.2 %   Neutrophils Relative % 68 %   Neutro Abs 2.9 1.7 - 7.7 K/uL   Lymphocytes Relative 23 %   Lymphs Abs 1.0 0.7 - 4.0 K/uL   Monocytes Relative 8 %   Monocytes Absolute 0.4 0.1  - 1.0 K/uL   Eosinophils Relative 1 %   Eosinophils Absolute 0.0 0.0 - 0.5 K/uL   Basophils Relative 0 %   Basophils Absolute 0.0 0.0 - 0.1 K/uL   Immature Granulocytes 0 %   Abs Immature Granulocytes 0.01 0.00 - 0.07 K/uL    Comment: Performed at [facility], [address] [place], [place] [id number]  Basic metabolic panel     Status: Abnormal   Collection Time: 10/18/18  5:21 PM  Result Value Ref Range   Sodium 145 135 - 145 mmol/L   Potassium 3.6 3.5 - 5.1 mmol/L   Chloride 116 (H) 98 - 111 mmol/L   CO2 24 22 - 32 mmol/L   Glucose, Bld 80 70 - 99 mg/dL   BUN 25 (H) 8 - 23 mg/dL   Creatinine, Ser 0.83 0.61 - 1.24 mg/dL   Calcium 8.3 (L) 8.9 - 10.3 mg/dL   GFR calc non Af Amer >60 >60 mL/min   GFR calc Af Amer >60 >60 mL/min   Anion gap 5 5 - 15    Comment: Performed at [facility], [place], [place] [address]  Procalcitonin - Baseline     Status: None   Collection Time: 10/18/18  5:21 PM  Result Value Ref Range   Procalcitonin <0.10 ng/mL    Comment:        Interpretation: PCT (Procalcitonin) <= 0.5 ng/mL: Systemic infection (sepsis) is not likely. Local bacterial infection is possible. (NOTE)       Sepsis PCT Algorithm           Lower Respiratory Tract                                      Infection PCT Algorithm    ----------------------------     ----------------------------         PCT < 0.25 ng/mL                PCT < 0.10 ng/mL         Strongly encourage             Strongly discourage   discontinuation of antibiotics    initiation of antibiotics    ----------------------------     -----------------------------       PCT 0.25 - 0.50 ng/mL            PCT 0.10 - 0.25 ng/mL  OR       >80% decrease in PCT            Discourage initiation of                                            antibiotics      Encourage discontinuation           of antibiotics    ----------------------------     -----------------------------          PCT >= 0.50 ng/mL              PCT 0.26 - 0.50 ng/mL               AND        <80% decrease in PCT             Encourage initiation of                                             antibiotics       Encourage continuation           of antibiotics    ----------------------------     -----------------------------        PCT >= 0.50 ng/mL                  PCT > 0.50 ng/mL               AND         increase in PCT                  Strongly encourage                                      initiation of antibiotics    Strongly encourage escalation           of antibiotics                                     -----------------------------                                           PCT <= 0.25 ng/mL                                                 OR                                        > 80% decrease in PCT                                     Discontinue / Do not initiate  antibiotics Performed at [facility], [address] [place], [place] [address]   Hepatic function panel     Status: Abnormal   Collection Time: 10/18/18  6:47 PM  Result Value Ref Range   Total Protein 5.9 (L) 6.5 - 8.1 g/dL   Albumin 2.8 (L) 3.5 - 5.0 g/dL   AST 21 15 - 41 U/L   ALT 15 0 - 44 U/L   Alkaline Phosphatase 54 38 - 126 U/L   Total Bilirubin 0.5 0.3 - 1.2 mg/dL   Bilirubin, Direct <0.1 0.0 - 0.2 mg/dL   Indirect Bilirubin NOT CALCULATED 0.3 - 0.9 mg/dL    Comment: Performed at [facility], [place], [place] [address]  Troponin I (High Sensitivity)     Status: Abnormal   Collection Time: 10/18/18  6:47 PM  Result Value Ref Range   Troponin I (High Sensitivity) 25 (H) <18 ng/L    Comment: (NOTE) Elevated high sensitivity troponin I (hsTnI) values and significant  changes across serial measurements may suggest ACS but many other  chronic and acute conditions are known to elevate hsTnI results.  Refer to the Links section  for chest pain algorithms and additional  guidance. Performed at [facility], [place], [place] [address]   Troponin I (High Sensitivity)     Status: Abnormal   Collection Time: 10/18/18  8:27 PM  Result Value Ref Range   Troponin I (High Sensitivity) 28 (H) <18 ng/L    Comment: (NOTE) Elevated high sensitivity troponin I (hsTnI) values and significant  changes across serial measurements may suggest ACS but many other  chronic and acute conditions are known to elevate hsTnI results.  Refer to the "Links" section for chest pain algorithms and additional  guidance. Performed at [facility], [address] [place], [place] [id number]   Basic metabolic panel     Status: Abnormal   Collection Time: 10/19/18  5:10 AM  Result Value Ref Range   Sodium 147 (H) 135 - 145 mmol/L   Potassium 4.3 3.5 - 5.1 mmol/L   Chloride 114 (H) 98 - 111 mmol/L   CO2 26 22 - 32 mmol/L   Glucose, Bld 228 (H) 70 - 99 mg/dL   BUN 26 (H) 8 - 23 mg/dL   Creatinine, Ser 1.00 0.61 - 1.24 mg/dL   Calcium 8.4 (L) 8.9 - 10.3 mg/dL   GFR calc non Af Amer >60 >60 mL/min   GFR calc Af Amer >60 >60 mL/min   Anion gap 7 5 - 15    Comment: Performed at [facility], [address] [place], [place] [address]  CBC     Status: Abnormal   Collection Time: 10/19/18  5:10 AM  Result Value Ref Range   WBC 2.0 (L) 4.0 - 10.5 K/uL   RBC 3.62 (L) 4.22 - 5.81 MIL/uL   Hemoglobin 12.2 (L) 13.0 - 17.0 g/dL   HCT 37.1 (L) 39.0 - 52.0 %   MCV 102.5 (H) 80.0 - 100.0 fL   MCH 33.7 26.0 - 34.0 pg   MCHC 32.9 30.0 - 36.0 g/dL   RDW 13.1 11.5 - 15.5 %   Platelets 125 (L) 150 - 400 K/uL    Comment: Immature Platelet Fraction may be clinically indicated, consider ordering this additional test [id number]    nRBC 0.0 0.0 - 0.2 %    Comment: Performed at [facility], [address] [place], [place] [address]  Dg Chest Portable 1 View  Result Date: 10/18/2018 CLINICAL  DATA:  COVID-19 positive. Diabetes. Hypertension. COPD. Bladder cancer. EXAM: PORTABLE CHEST 1 VIEW COMPARISON:  06/06/2018 FINDINGS: Two frontal views of the chest. Midline trachea. Borderline cardiomegaly. Atherosclerosis in the transverse aorta. No pleural effusion or pneumothorax. Moderate pulmonary interstitial thickening is not significantly changed. Bibasilar opacities, favored to represent scarring or subsegmental atelectasis, similar. IMPRESSION: Chronic interstitial thickening, likely related to the clinical history of COPD. No acute superimposed process. Aortic Atherosclerosis (ICD10-I70.0). Electronically Signed   By: Abigail Miyamoto M.D.   On: 10/18/2018 18:33    Pending Labs Unresulted Labs (From admission, onward)    Start     Ordered   10/25/18 0500  Creatinine, serum  (enoxaparin (LOVENOX)    CrCl >/= 30 ml/min)  Weekly,   STAT    Comments: while on enoxaparin therapy    10/19/18 0319   10/20/18 0500  CBC  Tomorrow morning,   STAT    Question:  Specimen collection method  Answer:  Lab=Lab collect   10/19/18 1505   10/20/18 0500  C-reactive protein  Daily,   STAT    Question:  Specimen collection method  Answer:  Lab=Lab collect   10/19/18 1505   10/20/18 0500  Comprehensive metabolic panel  Tomorrow morning,   STAT    Question:  Specimen collection method  Answer:  Lab=Lab collect   10/19/18 1505   10/20/18 0500  Fibrin derivatives D-Dimer ([place] only)  Daily,   STAT    Question:  Specimen collection method  Answer:  Lab=Lab collect   10/19/18 1505   10/19/18 1305  C-reactive protein  ONCE - STAT,   STAT    Question:  Specimen collection method  Answer:  Lab=Lab collect   10/19/18 1304   10/18/18 1836  Urinalysis, Complete w Microscopic  Once,   STAT     10/18/18 1835          Vitals/Pain Today's Vitals   10/19/18 1510 10/19/18 1835 10/19/18 1928 10/19/18 2016  BP: (!) 153/99 116/86  (!) 130/98  Pulse: 83 84  79  Resp: 20 18  20   Temp: 99.8 F (37.7 C) 100.1 F  (37.8 C) 99.8 F (37.7 C)   TempSrc: Rectal Rectal Rectal   SpO2: 100% 99%  97%  Weight:      Height:      PainSc:        Isolation Precautions Airborne and Contact precautions  Medications Medications  albuterol (VENTOLIN HFA) 108 (90 Base) MCG/ACT inhaler 2 puff (2 puffs Inhalation Not Given 10/18/18 2351)  dexamethasone (DECADRON) tablet 6 mg (6 mg Oral Given 10/19/18 1101)  Ipratropium-Albuterol (COMBIVENT) respimat 1 puff (has no administration in time range)  aspirin EC tablet 81 mg (81 mg Oral Given 10/19/18 1100)  atorvastatin (LIPITOR) tablet 20 mg (has no administration in time range)  pantoprazole (PROTONIX) EC tablet 40 mg (40 mg Oral Given 10/19/18 1101)  gabapentin (NEURONTIN) capsule 100 mg (has no administration in time range)  primidone (MYSOLINE) tablet 100 mg (100 mg Oral Given 10/19/18 1103)  tiotropium (SPIRIVA) inhalation capsule ([facility] use ONLY) 18 mcg (18 mcg Inhalation Given 10/19/18 0843)  traZODone (DESYREL) tablet 100 mg (has no administration in time range)  PARoxetine (PAXIL) tablet 30 mg (30 mg Oral Given 10/19/18 1102)  enoxaparin (LOVENOX) injection 40 mg (40 mg Subcutaneous Given 10/19/18 0622)  acetaminophen (TYLENOL) tablet 650 mg (has no administration in time range)    Or  acetaminophen (TYLENOL)  suppository 650 mg (has no administration in time range)  ondansetron (ZOFRAN) tablet 4 mg (has no administration in time range)    Or  ondansetron (ZOFRAN) injection 4 mg (has no administration in time range)  acetaminophen (TYLENOL) suppository 650 mg (650 mg Rectal Given 10/18/18 1723)  methylPREDNISolone sodium succinate (SOLU-MEDROL) 125 mg/2 mL injection 125 mg (125 mg Intravenous Given 10/18/18 1855)    Mobility non-ambulatory High fall risk   Focused Assessments Pulmonary Assessment Handoff:  Lung sounds:   O2 Device: Room Air        R Recommendations: See Admitting Provider Note  Report given to:   Additional Notes:  Pt  has dementia DNR

## 2018-10-19 NOTE — ED Notes (Signed)
Purple DNR armband placed on pt's left wrist at this time.

## 2018-10-19 NOTE — NC FL2 (Signed)
[place] LEVEL OF CARE SCREENING TOOL     IDENTIFICATION  Patient Name: Chase Simmons Birthdate: 1933/07/16 Sex: male Admission Date (Current Location): 10/18/2018  [place] and [place] Number:  Engineering geologist and Address:  [facility], [address] [place], [place] [address]      Provider Number: [id number]  Attending Physician Name and Address:  Max Sane, MD  Relative Name and Phone Number:  Arby Hatt    Current Level of Care: Hospital Recommended Level of Care: [place] Prior Approval Number:    Date Approved/Denied:   PASRR Number: [id number]  Discharge Plan: SNF    Current Diagnoses: Patient Active Problem List   Diagnosis Date Noted  . COVID-19 virus infection 10/18/2018  . Weakness 06/07/2018  . Skin tear of elbow without complication [id number]  . Lactic acidosis 06/06/2018  . Depression 04/20/2018  . Altered mental status   . Frequency of urination 03/01/2018  . Confusion 03/01/2018  . Bilateral leg edema 11/08/2016  . Vertigo 08/25/2016  . Nausea 08/01/2016  . CHF (congestive heart failure) ([place]) 08/01/2016  . Atypical chest pain 07/20/2016  . COPD (chronic obstructive pulmonary disease) ([place]) 03/15/2016  . Coronary atherosclerosis of native coronary artery 01/05/2016  . Aortic atherosclerosis ([place]) 01/05/2016  . Bradycardia 01/05/2016  . Anxiety 06/17/2015  . Malignant neoplasm of urinary bladder ([place]) 06/17/2015  . Allergic rhinitis 05/14/2015  . Knee pain, bilateral 03/16/2015  . PAF (paroxysmal atrial fibrillation) ([place]) 08/30/2013  . Acid reflux 08/30/2013  . Tremor, essential 10/22/2012  . Hyperlipidemia 10/22/2012  . HTN (hypertension) 08/01/2012  . Type 2 diabetes mellitus with complication, without long-term current use of insulin ([place]) 08/01/2012  . Benign prostatic hyperplasia with urinary obstruction 09/02/2011    Orientation RESPIRATION BLADDER Height & Weight      Self  Normal Incontinent Weight: 77.1 kg Height:  5\' 9"  (175.3 cm)  BEHAVIORAL SYMPTOMS/MOOD NEUROLOGICAL BOWEL NUTRITION STATUS  (none) (none) Incontinent Diet  AMBULATORY STATUS COMMUNICATION OF NEEDS Skin   Limited Assist Verbally Bruising                       Personal Care Assistance Level of Assistance              Functional Limitations Info             SPECIAL CARE FACTORS FREQUENCY                       Contractures Contractures Info: Not present    Additional Factors Info  Code Status Code Status Info: DNR             Current Medications (10/19/2018):  This is the current hospital active medication list Current Facility-Administered Medications  Medication Dose Route Frequency Provider Last Rate Last Dose  . acetaminophen (TYLENOL) tablet 650 mg  650 mg Oral Q6H PRN Lance Coon, MD       Or  . acetaminophen (TYLENOL) suppository 650 mg  650 mg Rectal Q6H PRN Lance Coon, MD      . albuterol (VENTOLIN HFA) 108 (90 Base) MCG/ACT inhaler 2 puff  2 puff Inhalation Once Lance Coon, MD      . aspirin EC tablet 81 mg  81 mg Oral Daily Lance Coon, MD   81 mg at 10/19/18 1100  . atorvastatin (LIPITOR) tablet 20 mg  20 mg Oral q1800 Lance Coon, MD      .  dexamethasone (DECADRON) tablet 6 mg  6 mg Oral Daily Lance Coon, MD   6 mg at 10/19/18 1101  . enoxaparin (LOVENOX) injection 40 mg  40 mg Subcutaneous Q24H Lance Coon, MD   40 mg at 10/19/18 [id number]  . gabapentin (NEURONTIN) capsule 100 mg  100 mg Oral QHS Lance Coon, MD      . Ipratropium-Albuterol (COMBIVENT) respimat 1 puff  1 puff Inhalation Q6H PRN Lance Coon, MD      . ondansetron [facility]) tablet 4 mg  4 mg Oral Q6H PRN Lance Coon, MD       Or  . ondansetron [facility]) injection 4 mg  4 mg Intravenous Q6H PRN Lance Coon, MD      . pantoprazole (PROTONIX) EC tablet 40 mg  40 mg Oral Daily Lance Coon, MD   40 mg at 10/19/18 1101  . PARoxetine (PAXIL) tablet 30  mg  30 mg Oral Daily Lance Coon, MD   30 mg at 10/19/18 1102  . primidone (MYSOLINE) tablet 100 mg  100 mg Oral TID Lance Coon, MD   100 mg at 10/19/18 1103  . tiotropium (SPIRIVA) inhalation capsule ([facility] use ONLY) 18 mcg  18 mcg Inhalation Daily Lance Coon, MD   18 mcg at 10/19/18 0843  . traZODone (DESYREL) tablet 100 mg  100 mg Oral QHS Lance Coon, MD       Current Outpatient Medications  Medication Sig Dispense Refill  . amLODipine (NORVASC) 2.5 MG tablet Take 1 tablet (2.5 mg total) by mouth daily.    Marland Kitchen aspirin EC 81 MG tablet Take 81 mg by mouth daily.    Marland Kitchen atorvastatin (LIPITOR) 20 MG tablet Take 1 tablet (20 mg total) by mouth daily. 90 tablet 3  . gabapentin (NEURONTIN) 100 MG capsule Take 100 mg by mouth at bedtime.     Marland Kitchen glimepiride (AMARYL) 2 MG tablet Take 2 mg by mouth daily with breakfast.    . lisinopril (PRINIVIL,ZESTRIL) 20 MG tablet Take 1 tablet (20 mg total) by mouth daily. 90 tablet 1  . metFORMIN (GLUCOPHAGE) 500 MG tablet TAKE 1 TABLET BY MOUTH TWICE A DAY 180 tablet 2  . pantoprazole (PROTONIX) 40 MG tablet Take 1 tablet (40 mg total) by mouth daily. 90 tablet 1  . PARoxetine (PAXIL) 30 MG tablet Take 30 mg by mouth daily.    . polyethylene glycol (MIRALAX / GLYCOLAX) 17 g packet Take 17 g by mouth daily as needed for mild constipation. 14 each 0  . primidone (MYSOLINE) 50 MG tablet Take 100 mg by mouth 3 (three) times daily.     . QUEtiapine (SEROQUEL) 50 MG tablet Take 50-100 mg by mouth 3 (three) times daily.     Marland Kitchen tiotropium (SPIRIVA) 18 MCG inhalation capsule Place 18 mcg as needed into inhaler and inhale.     . traZODone (DESYREL) 100 MG tablet Take 100 mg by mouth at bedtime.    Marland Kitchen acetaminophen (TYLENOL) 325 MG tablet Take 2 tablets (650 mg total) by mouth every 6 (six) hours as needed for mild pain (or Fever >/= 101).    Marland Kitchen albuterol (PROVENTIL) (2.5 MG/3ML) 0.083% nebulizer solution Take 3 mLs (2.5 mg total) by nebulization every 2 (two) hours as  needed for wheezing. 75 mL 12     Discharge Medications: Please see discharge summary for a list of discharge medications.  Relevant Imaging Results:  Relevant Lab Results:   Additional Information Covid +- this was the reason patient was sent to the  ED from SNF            [facility] RN  Katrina Stack, RN

## 2018-10-20 ENCOUNTER — Inpatient Hospital Stay (HOSPITAL_COMMUNITY)
Admission: AD | Admit: 2018-10-20 | Discharge: 2018-10-24 | DRG: 178 | Disposition: A | Discharge status: Skilled Nursing Facility | Payer: Medicare HMO | Source: Other Acute Inpatient Hospital | Attending: Internal Medicine | Admitting: Internal Medicine

## 2018-10-20 ENCOUNTER — Observation Stay (HOSPITAL_COMMUNITY): Payer: Medicare HMO

## 2018-10-20 ENCOUNTER — Other Ambulatory Visit: Payer: Self-pay

## 2018-10-20 DIAGNOSIS — F05 Delirium due to known physiological condition: Secondary | ICD-10-CM | POA: Diagnosis present

## 2018-10-20 DIAGNOSIS — Z803 Family history of malignant neoplasm of breast: Secondary | ICD-10-CM | POA: Diagnosis not present

## 2018-10-20 DIAGNOSIS — I110 Hypertensive heart disease with heart failure: Secondary | ICD-10-CM | POA: Diagnosis not present

## 2018-10-20 DIAGNOSIS — E119 Type 2 diabetes mellitus without complications: Secondary | ICD-10-CM | POA: Diagnosis present

## 2018-10-20 DIAGNOSIS — J449 Chronic obstructive pulmonary disease, unspecified: Secondary | ICD-10-CM | POA: Diagnosis not present

## 2018-10-20 DIAGNOSIS — Z87891 Personal history of nicotine dependence: Secondary | ICD-10-CM | POA: Diagnosis not present

## 2018-10-20 DIAGNOSIS — I480 Paroxysmal atrial fibrillation: Secondary | ICD-10-CM | POA: Diagnosis present

## 2018-10-20 DIAGNOSIS — R58 Hemorrhage, not elsewhere classified: Secondary | ICD-10-CM | POA: Diagnosis not present

## 2018-10-20 DIAGNOSIS — I5022 Chronic systolic (congestive) heart failure: Secondary | ICD-10-CM

## 2018-10-20 DIAGNOSIS — Z7984 Long term (current) use of oral hypoglycemic drugs: Secondary | ICD-10-CM | POA: Diagnosis not present

## 2018-10-20 DIAGNOSIS — Z66 Do not resuscitate: Secondary | ICD-10-CM | POA: Diagnosis present

## 2018-10-20 DIAGNOSIS — Z8551 Personal history of malignant neoplasm of bladder: Secondary | ICD-10-CM

## 2018-10-20 DIAGNOSIS — R251 Tremor, unspecified: Secondary | ICD-10-CM | POA: Diagnosis not present

## 2018-10-20 DIAGNOSIS — F0391 Unspecified dementia with behavioral disturbance: Secondary | ICD-10-CM | POA: Diagnosis present

## 2018-10-20 DIAGNOSIS — Z818 Family history of other mental and behavioral disorders: Secondary | ICD-10-CM | POA: Diagnosis not present

## 2018-10-20 DIAGNOSIS — Z8042 Family history of malignant neoplasm of prostate: Secondary | ICD-10-CM

## 2018-10-20 DIAGNOSIS — E785 Hyperlipidemia, unspecified: Secondary | ICD-10-CM | POA: Diagnosis present

## 2018-10-20 DIAGNOSIS — R0602 Shortness of breath: Secondary | ICD-10-CM

## 2018-10-20 DIAGNOSIS — E1165 Type 2 diabetes mellitus with hyperglycemia: Secondary | ICD-10-CM | POA: Diagnosis not present

## 2018-10-20 DIAGNOSIS — Z7982 Long term (current) use of aspirin: Secondary | ICD-10-CM

## 2018-10-20 DIAGNOSIS — Z91040 Latex allergy status: Secondary | ICD-10-CM

## 2018-10-20 DIAGNOSIS — Z79899 Other long term (current) drug therapy: Secondary | ICD-10-CM | POA: Diagnosis not present

## 2018-10-20 DIAGNOSIS — R5381 Other malaise: Secondary | ICD-10-CM | POA: Diagnosis not present

## 2018-10-20 DIAGNOSIS — K219 Gastro-esophageal reflux disease without esophagitis: Secondary | ICD-10-CM | POA: Diagnosis present

## 2018-10-20 DIAGNOSIS — M62462 Contracture of muscle, left lower leg: Secondary | ICD-10-CM | POA: Diagnosis not present

## 2018-10-20 DIAGNOSIS — G250 Essential tremor: Secondary | ICD-10-CM | POA: Diagnosis present

## 2018-10-20 DIAGNOSIS — E118 Type 2 diabetes mellitus with unspecified complications: Secondary | ICD-10-CM

## 2018-10-20 DIAGNOSIS — I428 Other cardiomyopathies: Secondary | ICD-10-CM | POA: Diagnosis not present

## 2018-10-20 DIAGNOSIS — I10 Essential (primary) hypertension: Secondary | ICD-10-CM | POA: Diagnosis not present

## 2018-10-20 DIAGNOSIS — R69 Illness, unspecified: Secondary | ICD-10-CM | POA: Diagnosis not present

## 2018-10-20 DIAGNOSIS — I2510 Atherosclerotic heart disease of native coronary artery without angina pectoris: Secondary | ICD-10-CM | POA: Diagnosis present

## 2018-10-20 DIAGNOSIS — M62461 Contracture of muscle, right lower leg: Secondary | ICD-10-CM | POA: Diagnosis not present

## 2018-10-20 DIAGNOSIS — U071 COVID-19: Principal | ICD-10-CM | POA: Diagnosis present

## 2018-10-20 DIAGNOSIS — M2550 Pain in unspecified joint: Secondary | ICD-10-CM | POA: Diagnosis not present

## 2018-10-20 DIAGNOSIS — I509 Heart failure, unspecified: Secondary | ICD-10-CM

## 2018-10-20 DIAGNOSIS — Z7401 Bed confinement status: Secondary | ICD-10-CM | POA: Diagnosis not present

## 2018-10-20 LAB — FERRITIN: Ferritin: 187 ng/mL (ref 24–336)

## 2018-10-20 LAB — COMPREHENSIVE METABOLIC PANEL
ALT: 17 U/L (ref 0–44)
AST: 20 U/L (ref 15–41)
Albumin: 3.2 g/dL — ABNORMAL LOW (ref 3.5–5.0)
Alkaline Phosphatase: 63 U/L (ref 38–126)
Anion gap: 10 (ref 5–15)
BUN: 29 mg/dL — ABNORMAL HIGH (ref 8–23)
CO2: 26 mmol/L (ref 22–32)
Calcium: 8.3 mg/dL — ABNORMAL LOW (ref 8.9–10.3)
Chloride: 108 mmol/L (ref 98–111)
Creatinine, Ser: 0.94 mg/dL (ref 0.61–1.24)
GFR calc Af Amer: >60 mL/min (ref >60)
GFR calc non Af Amer: >60 mL/min (ref >60)
Glucose, Bld: 137 mg/dL — ABNORMAL HIGH (ref 70–99)
Potassium: 3.2 mmol/L — ABNORMAL LOW (ref 3.5–5.1)
Sodium: 144 mmol/L (ref 135–145)
Total Bilirubin: 0.8 mg/dL (ref 0.3–1.2)
Total Protein: 6.8 g/dL (ref 6.5–8.1)

## 2018-10-20 LAB — CBC WITH DIFFERENTIAL/PLATELET
Abs Immature Granulocytes: 0.02 10*3/uL (ref 0.00–0.07)
Basophils Absolute: 0 10*3/uL (ref 0.0–0.1)
Basophils Relative: 0 %
Eosinophils Absolute: 0 10*3/uL (ref 0.0–0.5)
Eosinophils Relative: 0 %
HCT: 41 % (ref 39.0–52.0)
Hemoglobin: 13.4 g/dL (ref 13.0–17.0)
Immature Granulocytes: 0 %
Lymphocytes Relative: 16 %
Lymphs Abs: 0.9 10*3/uL (ref 0.7–4.0)
MCH: 33.9 pg (ref 26.0–34.0)
MCHC: 32.7 g/dL (ref 30.0–36.0)
MCV: 103.8 fL — ABNORMAL HIGH (ref 80.0–100.0)
Monocytes Absolute: 0.5 10*3/uL (ref 0.1–1.0)
Monocytes Relative: 9 %
Neutro Abs: 4.1 10*3/uL (ref 1.7–7.7)
Neutrophils Relative %: 75 %
Platelets: 142 10*3/uL — ABNORMAL LOW (ref 150–400)
RBC: 3.95 MIL/uL — ABNORMAL LOW (ref 4.22–5.81)
RDW: 12.8 % (ref 11.5–15.5)
WBC: 5.6 10*3/uL (ref 4.0–10.5)
nRBC: 0 % (ref 0.0–0.2)

## 2018-10-20 LAB — GLUCOSE, CAPILLARY
Glucose-Capillary: 131 mg/dL — ABNORMAL HIGH (ref 70–99)
Glucose-Capillary: 146 mg/dL — ABNORMAL HIGH (ref 70–99)
Glucose-Capillary: 191 mg/dL — ABNORMAL HIGH (ref 70–99)
Glucose-Capillary: 154 mg/dL — ABNORMAL HIGH (ref 70–99)

## 2018-10-20 LAB — C-REACTIVE PROTEIN: CRP: 2.5 mg/dL — ABNORMAL HIGH (ref <1.0)

## 2018-10-20 LAB — MRSA PCR SCREENING: MRSA by PCR: NEGATIVE

## 2018-10-20 LAB — HEMOGLOBIN A1C
Hgb A1c MFr Bld: 6 % — ABNORMAL HIGH (ref 4.8–5.6)
Mean Plasma Glucose: 125.5 mg/dL

## 2018-10-20 LAB — ABO/RH: ABO/RH(D): A POS

## 2018-10-20 LAB — D-DIMER, QUANTITATIVE: D-Dimer, Quant: 8.12 ug/mL-FEU — ABNORMAL HIGH (ref 0.00–0.50)

## 2018-10-20 MED ORDER — ASPIRIN EC 81 MG PO TBEC
81.0000 mg | DELAYED_RELEASE_TABLET | Freq: Every day | ORAL | Status: DC
  Administered 2018-10-20 – 2018-10-24 (×5): 81 mg via ORAL
  Filled 2018-10-20 (×5): qty 1

## 2018-10-20 MED ORDER — PAROXETINE HCL 30 MG PO TABS
30.0000 mg | ORAL_TABLET | Freq: Every day | ORAL | Status: DC
  Administered 2018-10-20 – 2018-10-24 (×5): 30 mg via ORAL
  Filled 2018-10-20 (×5): qty 1

## 2018-10-20 MED ORDER — SODIUM CHLORIDE 0.9 % IV SOLN: 250.0000 mL | INTRAVENOUS | Status: DC | PRN

## 2018-10-20 MED ORDER — TRAZODONE HCL 50 MG PO TABS
100.0000 mg | ORAL_TABLET | Freq: Every day | ORAL | Status: DC
  Administered 2018-10-20 – 2018-10-23 (×4): 100 mg via ORAL
  Filled 2018-10-20 (×4): qty 2

## 2018-10-20 MED ORDER — DEXAMETHASONE SODIUM PHOSPHATE 10 MG/ML IJ SOLN
6.0000 mg | INTRAMUSCULAR | Status: DC
  Administered 2018-10-20 – 2018-10-21 (×2): 6 mg via INTRAVENOUS
  Filled 2018-10-20 (×3): qty 1

## 2018-10-20 MED ORDER — ATORVASTATIN CALCIUM 10 MG PO TABS
20.0000 mg | ORAL_TABLET | Freq: Every day | ORAL | Status: DC
  Administered 2018-10-20 – 2018-10-23 (×5): 20 mg via ORAL
  Filled 2018-10-20 (×5): qty 2

## 2018-10-20 MED ORDER — PRIMIDONE 50 MG PO TABS
100.0000 mg | ORAL_TABLET | Freq: Three times a day (TID) | ORAL | Status: DC
  Administered 2018-10-20 – 2018-10-24 (×13): 100 mg via ORAL
  Filled 2018-10-20 (×14): qty 2

## 2018-10-20 MED ORDER — SODIUM CHLORIDE 0.9% FLUSH
3.0000 mL | Freq: Two times a day (BID) | INTRAVENOUS | Status: DC
  Administered 2018-10-20 – 2018-10-24 (×9): 3 mL via INTRAVENOUS

## 2018-10-20 MED ORDER — TIOTROPIUM BROMIDE MONOHYDRATE 18 MCG IN CAPS
18.0000 ug | ORAL_CAPSULE | Freq: Every day | RESPIRATORY_TRACT | Status: DC
  Administered 2018-10-20 – 2018-10-24 (×5): 18 ug via RESPIRATORY_TRACT
  Filled 2018-10-20: qty 5

## 2018-10-20 MED ORDER — SODIUM CHLORIDE 0.9% FLUSH: 3.0000 mL | INTRAVENOUS | Status: DC | PRN

## 2018-10-20 MED ORDER — ENOXAPARIN SODIUM 40 MG/0.4ML ~~LOC~~ SOLN
40.0000 mg | Freq: Two times a day (BID) | SUBCUTANEOUS | Status: DC
  Administered 2018-10-20 – 2018-10-24 (×8): 40 mg via SUBCUTANEOUS
  Filled 2018-10-20 (×8): qty 0.4

## 2018-10-20 MED ORDER — PANTOPRAZOLE SODIUM 40 MG PO TBEC
40.0000 mg | DELAYED_RELEASE_TABLET | Freq: Every day | ORAL | Status: DC
  Administered 2018-10-20 – 2018-10-24 (×5): 40 mg via ORAL
  Filled 2018-10-20 (×5): qty 1

## 2018-10-20 MED ORDER — VITAMIN C 500 MG PO TABS
500.0000 mg | ORAL_TABLET | Freq: Every day | ORAL | Status: DC
  Administered 2018-10-20 – 2018-10-24 (×5): 500 mg via ORAL
  Filled 2018-10-20 (×5): qty 1

## 2018-10-20 MED ORDER — ENOXAPARIN SODIUM 40 MG/0.4ML ~~LOC~~ SOLN
40.0000 mg | SUBCUTANEOUS | Status: DC
  Administered 2018-10-20: 40 mg via SUBCUTANEOUS
  Filled 2018-10-20: qty 0.4

## 2018-10-20 MED ORDER — GABAPENTIN 100 MG PO CAPS
100.0000 mg | ORAL_CAPSULE | Freq: Every day | ORAL | Status: DC
  Administered 2018-10-20 – 2018-10-23 (×5): 100 mg via ORAL
  Filled 2018-10-20 (×5): qty 1

## 2018-10-20 MED ORDER — INSULIN ASPART 100 UNIT/ML ~~LOC~~ SOLN
0.0000 [IU] | Freq: Three times a day (TID) | SUBCUTANEOUS | Status: DC
  Administered 2018-10-20: 1 [IU] via SUBCUTANEOUS
  Administered 2018-10-20: 2 [IU] via SUBCUTANEOUS
  Administered 2018-10-21: 5 [IU] via SUBCUTANEOUS
  Administered 2018-10-21: 2 [IU] via SUBCUTANEOUS
  Administered 2018-10-22: 1 [IU] via SUBCUTANEOUS
  Administered 2018-10-22: 3 [IU] via SUBCUTANEOUS
  Administered 2018-10-23: 2 [IU] via SUBCUTANEOUS
  Administered 2018-10-23 (×2): 3 [IU] via SUBCUTANEOUS
  Administered 2018-10-24: 1 [IU] via SUBCUTANEOUS

## 2018-10-20 MED ORDER — AMLODIPINE BESYLATE 5 MG PO TABS
2.5000 mg | ORAL_TABLET | Freq: Every day | ORAL | Status: DC
  Administered 2018-10-20 – 2018-10-22 (×3): 2.5 mg via ORAL
  Filled 2018-10-20 (×4): qty 1

## 2018-10-20 MED ORDER — QUETIAPINE FUMARATE 25 MG PO TABS
50.0000 mg | ORAL_TABLET | Freq: Three times a day (TID) | ORAL | Status: DC
  Administered 2018-10-20 – 2018-10-24 (×13): 50 mg via ORAL
  Filled 2018-10-20 (×13): qty 2

## 2018-10-20 MED ORDER — ZINC SULFATE 220 (50 ZN) MG PO CAPS
220.0000 mg | ORAL_CAPSULE | Freq: Every day | ORAL | Status: DC
  Administered 2018-10-20 – 2018-10-24 (×5): 220 mg via ORAL
  Filled 2018-10-20 (×5): qty 1

## 2018-10-20 NOTE — Progress Notes (Signed)
Vaccination and allergy admission questions needs to be completed once we are able to contact family member to answer these. I tried calling but no answer yet. Will hand off to day RN

## 2018-10-20 NOTE — ED Notes (Signed)
Assisted Nurse Sunday Spillers) with cleaning patient.AS

## 2018-10-20 NOTE — ED Notes (Signed)
Pt sleeping in room at this time. Rise and fall of chest noted by this RN.

## 2018-10-20 NOTE — H&P (Signed)
History and Physical    Chase Simmons [id number] DOB: [birth year] DOA: 10/20/2018  PCP: Leone Haven, MD  Patient coming from:  snf  Chief Complaint:   covid positive  HPI: Chase Simmons is a 85 y.o. male with medical history significant of htn, bladder cancer, nicm comes in from [place] as transfer due to covid pos.  No symptoms x some mild sob.  Unclear pts baseline.  Denies any pain.  100% on RA.  cxr neg.  Pt transferred from [facility] hospital .  Review of Systems: As per HPI otherwise 10 point review of systems negative.   Past Medical History:  Diagnosis Date  . Allergy   . Bladder cancer ([place])    a. followed by Dr. Jacqlyn Larsen  . Chronic combined systolic (congestive) and diastolic (congestive) heart failure ([place])    a. 07/2016 Echo: EF 30-35%, sev mid-apicalanteroseptal, ant, inf HK, Gr1 DD.  Marland Kitchen COPD (chronic obstructive pulmonary disease) ([place])   . Hyperlipidemia   . Hypertension   . NICM (nonischemic cardiomyopathy) ([place])    a. 07/2016 Echo: EF 30-35%, sev mid-apicalanteroseptal, ant, inf HK, Gr1 DD, mild MR, mildly dil LA, PASP 44mmHg - ? stress induced CM.  Marland Kitchen Nonobstructive Coronary atherosclerosis    a. 07/2016 Cath: LM nl, LAD nl, D1 40ost, LCX 80m, RCA nl, EF 25-30%.  Marland Kitchen PAF (paroxysmal atrial fibrillation) ([place])    a. Dx 08/2013. CHA2DS2VASc = 6-->no OAC 2/2 h/o falls and nocompliance.  . Tremor, essential    sees [facility] neurologist  . Type 2 diabetes mellitus with complication, without long-term current use of insulin ([place]) 08/01/2012  . Urinary incontinence     Past Surgical History:  Procedure Laterality Date  . bladder cancer    . CATARACT EXTRACTION    . HERNIA REPAIR    . LEFT HEART CATH AND CORONARY ANGIOGRAPHY N/A 07/11/2016   Procedure: Left Heart Cath and Coronary Angiography;  Surgeon: Wellington Hampshire, MD;  Location: [place] [facility];  Service: Cardiovascular;  Laterality: N/A;     reports that he quit smoking about 15 years ago. He has a  60.00 pack-year smoking history. He has never used smokeless tobacco. He reports current alcohol use of about 12.0 standard drinks of alcohol per week. He reports that he does not use drugs.  Allergies  Allergen Reactions  . Latex Itching    Family History  Problem Relation Age of Onset  . Arthritis Mother   . Dementia Mother   . Cancer Maternal Uncle        prostate  . Cancer Maternal Aunt        breast cancer    Prior to Admission medications   Medication Sig Start Date End Date Taking? Authorizing Provider  acetaminophen (TYLENOL) 325 MG tablet Take 2 tablets (650 mg total) by mouth every 6 (six) hours as needed for mild pain (or Fever >/= 101). 06/22/15   Gouru, Illene Silver, MD  albuterol (PROVENTIL) (2.5 MG/3ML) 0.083% nebulizer solution Take 3 mLs (2.5 mg total) by nebulization every 2 (two) hours as needed for wheezing. 06/08/18   Gladstone Lighter, MD  amLODipine (NORVASC) 2.5 MG tablet Take 1 tablet (2.5 mg total) by mouth daily. 06/09/18   Gladstone Lighter, MD  aspirin EC 81 MG tablet Take 81 mg by mouth daily.    [provider]  atorvastatin (LIPITOR) 20 MG tablet Take 1 tablet (20 mg total) by mouth daily. 07/10/18   Leone Haven, MD  gabapentin (NEURONTIN) 100  MG capsule Take 100 mg by mouth at bedtime.  05/22/18   [provider]  glimepiride (AMARYL) 2 MG tablet Take 2 mg by mouth daily with breakfast. 09/29/18   [provider]  lisinopril (PRINIVIL,ZESTRIL) 20 MG tablet Take 1 tablet (20 mg total) by mouth daily. 06/15/17   Leone Haven, MD  metFORMIN (GLUCOPHAGE) 500 MG tablet TAKE 1 TABLET BY MOUTH TWICE A DAY 05/23/18   Leone Haven, MD  pantoprazole (PROTONIX) 40 MG tablet Take 1 tablet (40 mg total) by mouth daily. 06/15/17   Leone Haven, MD  PARoxetine (PAXIL) 30 MG tablet Take 30 mg by mouth daily. 10/09/18   [provider]  polyethylene glycol (MIRALAX / GLYCOLAX) 17 g packet Take 17 g by mouth daily as needed for  mild constipation. 06/08/18   Gladstone Lighter, MD  primidone (MYSOLINE) 50 MG tablet Take 100 mg by mouth 3 (three) times daily.  09/29/18   [provider]  QUEtiapine (SEROQUEL) 50 MG tablet Take 50-100 mg by mouth 3 (three) times daily.  10/06/18   [provider]  tiotropium (SPIRIVA) 18 MCG inhalation capsule Place 18 mcg as needed into inhaler and inhale.     [provider]  traZODone (DESYREL) 100 MG tablet Take 100 mg by mouth at bedtime. 10/06/18   [provider]    Physical Exam: Vitals:   10/20/18 0617  BP: (!) 141/59  Pulse: 89  Resp: 20  SpO2: 98%     Constitutional: NAD, calm, comfortable Vitals:   10/20/18 0617  BP: (!) 141/59  Pulse: 89  Resp: 20  SpO2: 98%   Eyes: PERRL, lids and conjunctivae normal ENMT: Mucous membranes are moist. Posterior pharynx clear of any exudate or lesions.Normal dentition.  Neck: normal, supple, no masses, no thyromegaly Respiratory: clear to auscultation bilaterally, no wheezing, no crackles. Normal respiratory effort. No accessory muscle use.  Cardiovascular: Regular rate and rhythm, no murmurs / rubs / gallops. No extremity edema. 2+ pedal pulses. No carotid bruits.  Abdomen: no tenderness, no masses palpated. No hepatosplenomegaly. Bowel sounds positive.  Musculoskeletal: no clubbing / cyanosis. No joint deformity upper and lower extremities. Good ROM, no contractures. Normal muscle tone.  Skin: no rashes, lesions, ulcers. No induration Neurologic: CN 2-12 grossly intact. Sensation intact, DTR normal. Strength 5/5 in all 4.  Psychiatric: Normal judgment and insight. Alert and oriented x 1. Normal mood.    Labs on Admission: I have personally reviewed following labs and imaging studies  CBC: Recent Labs  Lab 10/18/18 1721 10/19/18 0510  WBC 4.3 2.0*  NEUTROABS 2.9  --   HGB 12.9* 12.2*  HCT 38.9* 37.1*  MCV 102.9* 102.5*  PLT 126* 0000000*   Basic Metabolic Panel: Recent Labs  Lab  10/18/18 1721 10/19/18 0510  NA 145 147*  K 3.6 4.3  CL 116* 114*  CO2 24 26  GLUCOSE 80 228*  BUN 25* 26*  CREATININE 0.83 1.00  CALCIUM 8.3* 8.4*   GFR: Estimated Creatinine Clearance: 56 mL/min (by C-G formula based on SCr of 1 mg/dL). Liver Function Tests: Recent Labs  Lab 10/18/18 1847  AST 21  ALT 15  ALKPHOS 54  BILITOT 0.5  PROT 5.9*  ALBUMIN 2.8*   No results for input(s): LIPASE, AMYLASE in the last 168 hours. No results for input(s): AMMONIA in the last 168 hours. Coagulation Profile: No results for input(s): INR, PROTIME in the last 168 hours. Cardiac Enzymes: No results for input(s): CKTOTAL, CKMB,  CKMBINDEX, TROPONINI in the last 168 hours. BNP (last 3 results) No results for input(s): PROBNP in the last 8760 hours. HbA1C: No results for input(s): HGBA1C in the last 72 hours. CBG: No results for input(s): GLUCAP in the last 168 hours. Lipid Profile: No results for input(s): CHOL, HDL, LDLCALC, TRIG, CHOLHDL, LDLDIRECT in the last 72 hours. Thyroid Function Tests: No results for input(s): TSH, T4TOTAL, FREET4, T3FREE, THYROIDAB in the last 72 hours. Anemia Panel: No results for input(s): VITAMINB12, FOLATE, FERRITIN, TIBC, IRON, RETICCTPCT in the last 72 hours. Urine analysis:    Component Value Date/Time   COLORURINE YELLOW 07/23/2018 1702   APPEARANCEUR CLEAR 07/23/2018 1702   APPEARANCEUR Clear 10/24/2017 1538   LABSPEC 1.015 07/23/2018 1702   PHURINE 6.5 07/23/2018 1702   GLUCOSEU NEGATIVE 07/23/2018 1702   HGBUR NEGATIVE 07/23/2018 1702   BILIRUBINUR NEGATIVE 07/23/2018 1702   BILIRUBINUR negative 02/27/2018 1636   BILIRUBINUR Negative 10/24/2017 1538   KETONESUR NEGATIVE 07/23/2018 1702   PROTEINUR NEGATIVE 07/23/2018 1702   UROBILINOGEN 0.2 02/27/2018 1636   NITRITE NEGATIVE 07/23/2018 1702   LEUKOCYTESUR NEGATIVE 07/23/2018 1702   Sepsis Labs: !!!!!!!!!!!!!!!!!!!!!!!!!!!!!!!!!!!!!!!!!!!! @LABRCNTIP (procalcitonin:4,lacticidven:4) )No  results found for this or any previous visit (from the past 240 hour(s)).   Radiological Exams on Admission: Dg Chest Portable 1 View  Result Date: 10/18/2018 CLINICAL DATA:  COVID-19 positive. Diabetes. Hypertension. COPD. Bladder cancer. EXAM: PORTABLE CHEST 1 VIEW COMPARISON:  06/06/2018 FINDINGS: Two frontal views of the chest. Midline trachea. Borderline cardiomegaly. Atherosclerosis in the transverse aorta. No pleural effusion or pneumothorax. Moderate pulmonary interstitial thickening is not significantly changed. Bibasilar opacities, favored to represent scarring or subsegmental atelectasis, similar. IMPRESSION: Chronic interstitial thickening, likely related to the clinical history of COPD. No acute superimposed process. Aortic Atherosclerosis (ICD10-I70.0). Electronically Signed   By: Abigail Miyamoto M.D.   On: 10/18/2018 18:33     Assessment/Plan 85 yo male with covid infection  Active Problems:   HTN (hypertension)- clarify and resume home meds    PAF (paroxysmal atrial fibrillation) (HCC)- rate controlled at this time    COPD (chronic obstructive pulmonary disease) (HCC)- lungs clear, monitor    CHF (congestive heart failure) ([place])- euvolemic    COVID-19 virus infection- vit c , zinc.  cxr neg.  100% on RA.  Supportive care only.  SNF pt     DVT prophylaxis:  scds Code Status:  DNR Family Communication:  none Disposition Plan:  obs Consults called:  none Admission status:  obs   Braxson Hollingsworth A MD [facility]  If 7PM-7AM, please contact night-coverage [email or web address] Password [email or web address]  10/20/2018, 6:22 AM

## 2018-10-20 NOTE — Progress Notes (Signed)
Contacted patients daughter [name] to provide updates. Per family member request she was made primary contact. Answered all questions and concerns at this time. Warrick Parisian, RN

## 2018-10-20 NOTE — ED Notes (Signed)
Pt moving in bed at this time. Pt in NAD.

## 2018-10-20 NOTE — Evaluation (Signed)
Clinical/Bedside Swallow Evaluation Patient Details  Name: Chase Simmons MRN: [id number] Date of Birth: [birth year]  Today's Date: 10/20/2018 Time: SLP Start Time (ACUTE ONLY): [id number] SLP Stop Time (ACUTE ONLY): 1355 SLP Time Calculation (min) (ACUTE ONLY): 10 min  Past Medical History:  Past Medical History:  Diagnosis Date  . Allergy   . Bladder cancer ([place])    a. followed by Dr. Jacqlyn Simmons  . Chronic combined systolic (congestive) and diastolic (congestive) heart failure ([place])    a. 07/2016 Echo: EF 30-35%, sev mid-apicalanteroseptal, ant, inf HK, Gr1 DD.  Marland Kitchen COPD (chronic obstructive pulmonary disease) ([place])   . Hyperlipidemia   . Hypertension   . NICM (nonischemic cardiomyopathy) ([place])    a. 07/2016 Echo: EF 30-35%, sev mid-apicalanteroseptal, ant, inf HK, Gr1 DD, mild MR, mildly dil LA, PASP 48mmHg - ? stress induced CM.  Marland Kitchen Nonobstructive Coronary atherosclerosis    a. 07/2016 Cath: LM nl, LAD nl, D1 40ost, LCX 54m, RCA nl, EF 25-30%.  Marland Kitchen PAF (paroxysmal atrial fibrillation) ([place])    a. Dx 08/2013. CHA2DS2VASc = 6-->no OAC 2/2 h/o falls and nocompliance.  . Tremor, essential    sees [facility] neurologist  . Type 2 diabetes mellitus with complication, without long-term current use of insulin ([place]) 08/01/2012  . Urinary incontinence    Past Surgical History:  Past Surgical History:  Procedure Laterality Date  . bladder cancer    . CATARACT EXTRACTION    . HERNIA REPAIR    . LEFT HEART CATH AND CORONARY ANGIOGRAPHY N/A 07/11/2016   Procedure: Left Heart Cath and Coronary Angiography;  Surgeon: Chase Hampshire, MD;  Location: [place] [facility];  Service: Cardiovascular;  Laterality: N/A;   HPI:  Patient is a 85 y.o. male with PMHx of phonic systolic heart failure, advanced dementia, essential tremor, DM-2, HTN-resident of SNF-transferred to [facility] ED on 10/15 after he was found to have Covid positive.  He was subsequently transferred to [facility] for further evaluation.    Assessment / Plan / Recommendation Clinical Impression  Pt demonstrates normal swallow function. Despite dementia, pt is fully aware of PO and eats and drinks without dificulty. RN confirms he has not noted difficulty. Recommend pt continue current diet. SLP will sign off.  SLP Visit Diagnosis: Dysphagia, unspecified (R13.10)    Aspiration Risk  Mild aspiration risk    Diet Recommendation Regular;Thin liquid   Liquid Administration via: Cup;Straw Medication Administration: Whole meds with liquid Supervision: Staff to assist with self feeding Postural Changes: Seated upright at 90 degrees    Other  Recommendations Oral Care Recommendations: Oral care BID   Follow up Recommendations None      Frequency and Duration            Prognosis        Swallow Study   General HPI: Patient is a 85 y.o. male with PMHx of phonic systolic heart failure, advanced dementia, essential tremor, DM-2, HTN-resident of SNF-transferred to [facility] ED on 10/15 after he was found to have Covid positive.  He was subsequently transferred to [facility] for further evaluation. Type of Study: Bedside Swallow Evaluation Diet Prior to this Study: Regular;Thin liquids Temperature Spikes Noted: No Respiratory Status: Room air Behavior/Cognition: Alert;Cooperative;Pleasant mood;Requires cueing Oral Cavity Assessment: Within Functional Limits Oral Care Completed by SLP: No Oral Cavity - Dentition: Adequate natural dentition Self-Feeding Abilities: Total assist Patient Positioning: Upright in bed Baseline Vocal Quality: Normal Volitional Cough: Cognitively unable to elicit Volitional Swallow: Unable to elicit  Oral/Motor/Sensory Function Overall Oral Motor/Sensory Function: Within functional limits   Ice Chips     Thin Liquid Thin Liquid: Within functional limits Presentation: Straw    Nectar Thick Nectar Thick Liquid: Not tested   Honey Thick Honey Thick Liquid: Not tested   Puree Puree:  Within functional limits   Solid     Solid: Within functional limits     Chase [place], MA [place] Pager [phone] Office [phone]   Chase Simmons 10/20/2018,3:03 PM

## 2018-10-20 NOTE — Progress Notes (Addendum)
PROGRESS NOTE                                                                                                                                                                                                             Patient Demographics:    Chase Simmons, is a 85 y.o. male, DOB - May 15, 1933, [id number]  Outpatient Primary MD for the patient is Leone Haven, MD   Admit date - 10/20/2018   LOS - 1  No chief complaint on file.      Brief Narrative: Patient is a 85 y.o. male with PMHx of phonic systolic heart failure, advanced dementia, essential tremor, DM-2, HTN-resident of SNF-transferred to [facility] ED on 10/15 after he was found to have Covid positive.  He was subsequently transferred to [facility] for further evaluation.   Subjective:    Chase Simmons today was lying comfortably in bed-he was mumbling incoherently-he was not following any commands.  Per nursing staff-no major events overnight.   Assessment  & Plan :   COVID-19 infection: Appears to be asymptomatic-no pneumonia on chest x-ray done on admission. CRP mildly elevated.  Repeat chest x-ray today.  Continue Decadron.  Attempted to get in touch with family (left message for son)-given frailty/advanced dementia-clearly not a candidate for aggressive care, if he were to become symptomatic and deteriorate-we will benefit from transition to comfort measures.  Fever: afebrile  O2 requirements: On RA  COVID-19 Labs: Recent Labs    10/20/18 0720  DDIMER 8.12*  FERRITIN 187  CRP 2.5*    Lab Results  Component Value Date   SARSCOV2NAA NEGATIVE 06/12/2018   [place] NEGATIVE 06/06/2018     COVID-19 Medications: Steroids: 10/15>> Remdesivir: Not indicated as not hypoxic-no pneumonia on imaging.   Actemra: Not indicated Convalescent Plasma: Not indicated Research Studies:N/A  Other medications: Diuretics:Euvolemic-no need for  lasix Antibiotics:Not needed as no evidence of bacterial infection  Prone/Incentive Spirometry: Given dementia-will not cooperate with these measures.  DVT Prophylaxis  :  Lovenox at twice daily dosing-given D-dimer more than 5.  Chronic systolic heart failure (EF 45-50% on 01/16/2017): Euvolemic-no indication for diuretics.  PAF: Rate controlled-clearly not a candidate for anticoagulation given frailty/advanced age/advanced dementia-continue aspirin.  DM-2: Hold oral hypoglycemic medications-continue SSI.  HTN: Resume amlodipine and follow.  COPD: Stable-continue bronchodilators  Nonobstructive CAD (by [facility] July  2018): Asymptomatic-no anginal symptoms.  Essential tremor: Continue primidone.  GERD: Continue PPI  Dementia with delirium: Probably has advanced dementia at baseline-exhibits frailty as well.  Continue Paxil, Seroquel and trazodone.  Palliative care: MOST form present in paper chart-DNR-appears to suggest only gentle medical treatment.  See above-we will try to get in touch with family later.  Debility/deconditioning/frailty: Very frail-not sure what baseline is-obtain SLP/PT/OT eval.   ABG:    Component Value Date/Time   PHART 7.36 06/20/2015 2124   PCO2ART 46 06/20/2015 2124   PO2ART 158 (H) 06/20/2015 2124   HCO3 26.0 06/20/2015 2124   ACIDBASEDEF 0.1 06/20/2015 2124   O2SAT 100.3 06/20/2015 2124    Vent Settings: N/A   Condition - Stable  Family Communication  : Left voicemail for son   Code Status :  DNR  Diet :  Diet Order            Diet Heart Room service appropriate? Yes; Fluid consistency: Thin  Diet effective now               Disposition Plan  :  Remain hospitalized  Barriers to discharge: Very frail-at risk for severe disease-continue IV steroids for a few more days.  Consults  :  None  Procedures  :  None  GI prophylaxis: PPI  Antibiotics  :    Anti-infectives (From admission, onward)   None      Inpatient Medications   Scheduled Meds: . enoxaparin (LOVENOX) injection  40 mg Subcutaneous Q24H  . sodium chloride flush  3 mL Intravenous Q12H  . vitamin C  500 mg Oral Daily  . zinc sulfate  220 mg Oral Daily   Continuous Infusions: . sodium chloride     PRN Meds:.sodium chloride, sodium chloride flush   Time Spent in minutes  25  See all Orders from today for further details   Oren Binet M.D on 10/20/2018 at 10:41 AM  To page go to [email or web address] - use universal password  [facility] -  Office  [phone]    Objective:   Vitals:   10/20/18 0617  BP: (!) 141/59  Pulse: 89  Resp: 20  Temp: 99.2 F (37.3 C)  TempSrc: Axillary  SpO2: 98%  Weight: 85.6 kg  Height: 5\' 9"  (1.753 m)    Wt Readings from Last 3 Encounters:  10/20/18 85.6 kg  10/18/18 77.1 kg  06/06/18 90.7 kg     Intake/Output Summary (Last 24 hours) at 10/20/2018 1041 Last data filed at 10/20/2018 0957 Gross per 24 hour  Intake 480 ml  Output -  Net 480 ml     Physical Exam Gen Exam: Very frail-mumbles incoherently-chronically sick appearing.  Not in any distress. HEENT:atraumatic, normocephalic Chest: B/L clear to auscultation anteriorly CVS:S1S2 regular Abdomen:soft non tender, non distended Extremities:no edema Neurology: Difficult exam-but moves all 4 extremities.   Skin: no rash   Data Review:    CBC Recent Labs  Lab 10/18/18 1721 10/19/18 0510 10/20/18 0720  WBC 4.3 2.0* 5.6  HGB 12.9* 12.2* 13.4  HCT 38.9* 37.1* 41.0  PLT 126* 125* 142*  MCV 102.9* 102.5* 103.8*  MCH 34.1* 33.7 33.9  MCHC 33.2 32.9 32.7  RDW 13.1 13.1 12.8  LYMPHSABS 1.0  --  0.9  MONOABS 0.4  --  0.5  EOSABS 0.0  --  0.0  BASOSABS 0.0  --  0.0    Chemistries  Recent Labs  Lab 10/18/18 1721 10/18/18 1847 10/19/18 0510 10/20/18 0720  NA 145  --  147* 144  K 3.6  --  4.3 3.2*  CL 116*  --  114* 108  CO2 24  --  26 26  GLUCOSE 80  --  228* 137*  BUN 25*  --  26* 29*  CREATININE 0.83  --  1.00  0.94  CALCIUM 8.3*  --  8.4* 8.3*  AST  --  21  --  20  ALT  --  15  --  17  ALKPHOS  --  54  --  63  BILITOT  --  0.5  --  0.8   ------------------------------------------------------------------------------------------------------------------ No results for input(s): CHOL, HDL, LDLCALC, TRIG, CHOLHDL, LDLDIRECT in the last 72 hours.  Lab Results  Component Value Date   HGBA1C 6.6 (H) 06/15/2017   ------------------------------------------------------------------------------------------------------------------ No results for input(s): TSH, T4TOTAL, T3FREE, THYROIDAB in the last 72 hours.  Invalid input(s): FREET3 ------------------------------------------------------------------------------------------------------------------ Recent Labs    10/20/18 0720  FERRITIN 187    Coagulation profile No results for input(s): INR, PROTIME in the last 168 hours.  Recent Labs    10/20/18 0720  DDIMER 8.12*    Cardiac Enzymes No results for input(s): CKMB, TROPONINI, MYOGLOBIN in the last 168 hours.  Invalid input(s): CK ------------------------------------------------------------------------------------------------------------------    Component Value Date/Time   BNP 285.0 (H) 06/06/2018 1600   BNP 51 11/08/2016 1656    Micro Results Recent Results (from the past 240 hour(s))  MRSA PCR Screening     Status: None   Collection Time: 10/20/18  6:55 AM   Specimen: Nasal Mucosa; Nasopharyngeal  Result Value Ref Range Status   MRSA by PCR NEGATIVE NEGATIVE Final    Comment:        The GeneXpert MRSA Assay (FDA approved for NASAL specimens only), is one component of a comprehensive MRSA colonization surveillance program. It is not intended to diagnose MRSA infection nor to guide or monitor treatment for MRSA infections. Performed at [facility], [place] [address] [place], [place] [address]     Radiology Reports Dg Chest Portable 1 View  Result Date:  10/18/2018 CLINICAL DATA:  COVID-19 positive. Diabetes. Hypertension. COPD. Bladder cancer. EXAM: PORTABLE CHEST 1 VIEW COMPARISON:  06/06/2018 FINDINGS: Two frontal views of the chest. Midline trachea. Borderline cardiomegaly. Atherosclerosis in the transverse aorta. No pleural effusion or pneumothorax. Moderate pulmonary interstitial thickening is not significantly changed. Bibasilar opacities, favored to represent scarring or subsegmental atelectasis, similar. IMPRESSION: Chronic interstitial thickening, likely related to the clinical history of COPD. No acute superimposed process. Aortic Atherosclerosis (ICD10-I70.0). Electronically Signed   By: Abigail Miyamoto M.D.   On: 10/18/2018 18:33

## 2018-10-20 NOTE — ED Notes (Signed)
Pt cleaned at his time by [facility] EDT.

## 2018-10-20 NOTE — Plan of Care (Signed)
Pt admitted from [facility] via EMT.Vitals stable on RA. Pt alert, oriented only to self. No signs of pain or SOB noted. Skin swarm done with RN Reonna, blanchable red sacral area noted with closed/scabbed area on L gluteal region, sacral foam applied for protection . CHG bath completed. Some admission questions unable to answer due to pt's mental status; physical assessment done. IV SL. Oriented to unit setup and routine. On call MD paged, arrived at bedside to review pt. CCMD also informed of telemetry monitoring for pt. Condom cath. Turned to R side with pillows in between knees as pt appears moderately contracted and stiff. applied Will monitor.  Problem: Education: Goal: Knowledge of risk factors and measures for prevention of condition will improve 10/20/2018 0626 by Melvyn Neth, RN Outcome: Progressing 10/20/2018 0626 by Melvyn Neth, RN Outcome: Progressing   Problem: Coping: Goal: Psychosocial and spiritual needs will be supported 10/20/2018 0626 by Melvyn Neth, RN Outcome: Progressing 10/20/2018 0626 by Melvyn Neth, RN Outcome: Progressing   Problem: Respiratory: Goal: Will maintain a patent airway 10/20/2018 0626 by Melvyn Neth, RN Outcome: Progressing 10/20/2018 0626 by Melvyn Neth, RN Outcome: Progressing Goal: Complications related to the disease process, condition or treatment will be avoided or minimized 10/20/2018 0626 by Melvyn Neth, RN Outcome: Progressing 10/20/2018 0626 by Melvyn Neth, RN Outcome: Progressing

## 2018-10-20 NOTE — Discharge Instructions (Signed)
Person Under Monitoring Name: Chase Simmons  Location: [place] [address]   Infection Prevention Recommendations for Individuals Confirmed to have, or Being Evaluated for, 2019 Novel Coronavirus (COVID-19) Infection Who Receive Care at Home  Individuals who are confirmed to have, or are being evaluated for, COVID-19 should follow the prevention steps below until a healthcare provider or local or state health department says they can return to normal activities.  Stay home except to get medical care You should restrict activities outside your home, except for getting medical care. Do not go to work, school, or public areas, and do not use public transportation or taxis.  Call ahead before visiting your doctor Before your medical appointment, call the healthcare provider and tell them that you have, or are being evaluated for, COVID-19 infection. This will help the healthcare providers office take steps to keep other people from getting infected. Ask your healthcare provider to call the local or state health department.  Monitor your symptoms Seek prompt medical attention if your illness is worsening (e.g., difficulty breathing). Before going to your medical appointment, call the healthcare provider and tell them that you have, or are being evaluated for, COVID-19 infection. Ask your healthcare provider to call the local or state health department.  Wear a facemask You should wear a facemask that covers your nose and mouth when you are in the same room with other people and when you visit a healthcare provider. People who live with or visit you should also wear a facemask while they are in the same room with you.  Separate yourself from other people in your home As much as possible, you should stay in a different room from other people in your home. Also, you should use a separate bathroom, if available.  Avoid sharing household items You should not  share dishes, drinking glasses, cups, eating utensils, towels, bedding, or other items with other people in your home. After using these items, you should wash them thoroughly with soap and water.  Cover your coughs and sneezes Cover your mouth and nose with a tissue when you cough or sneeze, or you can cough or sneeze into your sleeve. Throw used tissues in a lined trash can, and immediately wash your hands with soap and water for at least 20 seconds or use an alcohol-based hand rub.  Wash your [facility] your hands often and thoroughly with soap and water for at least 20 seconds. You can use an alcohol-based hand sanitizer if soap and water are not available and if your hands are not visibly dirty. Avoid touching your eyes, nose, and mouth with unwashed hands.   Prevention Steps for Caregivers and Household Members of Individuals Confirmed to have, or Being Evaluated for, COVID-19 Infection Being Cared for in the Home  If you live with, or provide care at home for, a person confirmed to have, or being evaluated for, COVID-19 infection please follow these guidelines to prevent infection:  Follow healthcare providers instructions Make sure that you understand and can help the patient follow any healthcare provider instructions for all care.  Provide for the patients basic needs You should help the patient with basic needs in the home and provide support for getting groceries, prescriptions, and other personal needs.  Monitor the patients symptoms If they are getting sicker, call his or her medical provider and tell them that the patient has, or is being evaluated for, COVID-19 infection. This will help the healthcare providers  office take steps to keep other people from getting infected. Ask the healthcare provider to call the local or state health department.  Limit the number of people who have contact with the patient  If possible, have only one caregiver for the  patient.  Other household members should stay in another home or place of residence. If this is not possible, they should stay  in another room, or be separated from the patient as much as possible. Use a separate bathroom, if available.  Restrict visitors who do not have an essential need to be in the home.  Keep older adults, very young children, and other sick people away from the patient Keep older adults, very young children, and those who have compromised immune systems or chronic health conditions away from the patient. This includes people with chronic heart, lung, or kidney conditions, diabetes, and cancer.  Ensure good ventilation Make sure that shared spaces in the home have good air flow, such as from an air conditioner or an opened window, weather permitting.  Wash your hands often  Wash your hands often and thoroughly with soap and water for at least 20 seconds. You can use an alcohol based hand sanitizer if soap and water are not available and if your hands are not visibly dirty.  Avoid touching your eyes, nose, and mouth with unwashed hands.  Use disposable paper towels to dry your hands. If not available, use dedicated cloth towels and replace them when they become wet.  Wear a facemask and gloves  Wear a disposable facemask at all times in the room and gloves when you touch or have contact with the patients blood, body fluids, and/or secretions or excretions, such as sweat, saliva, sputum, nasal mucus, vomit, urine, or feces.  Ensure the mask fits over your nose and mouth tightly, and do not touch it during use.  Throw out disposable facemasks and gloves after using them. Do not reuse.  Wash your hands immediately after removing your facemask and gloves.  If your personal clothing becomes contaminated, carefully remove clothing and launder. Wash your hands after handling contaminated clothing.  Place all used disposable facemasks, gloves, and other waste in a lined  container before disposing them with other household waste.  Remove gloves and wash your hands immediately after handling these items.  Do not share dishes, glasses, or other household items with the patient  Avoid sharing household items. You should not share dishes, drinking glasses, cups, eating utensils, towels, bedding, or other items with a patient who is confirmed to have, or being evaluated for, COVID-19 infection.  After the person uses these items, you should wash them thoroughly with soap and water.  Wash laundry thoroughly  Immediately remove and wash clothes or bedding that have blood, body fluids, and/or secretions or excretions, such as sweat, saliva, sputum, nasal mucus, vomit, urine, or feces, on them.  Wear gloves when handling laundry from the patient.  Read and follow directions on labels of laundry or clothing items and detergent. In general, wash and dry with the warmest temperatures recommended on the label.  Clean all areas the individual has used often  Clean all touchable surfaces, such as counters, tabletops, doorknobs, bathroom fixtures, toilets, phones, keyboards, tablets, and bedside tables, every day. Also, clean any surfaces that may have blood, body fluids, and/or secretions or excretions on them.  Wear gloves when cleaning surfaces the patient has come in contact with.  Use a diluted bleach solution (e.g., dilute bleach with 1  part bleach and 10 parts water) or a household disinfectant with a label that says EPA-registered for coronaviruses. To make a bleach solution at home, add 1 tablespoon of bleach to 1 quart (4 cups) of water. For a larger supply, add  cup of bleach to 1 gallon (16 cups) of water.  Read labels of cleaning products and follow recommendations provided on product labels. Labels contain instructions for safe and effective use of the cleaning product including precautions you should take when applying the product, such as wearing gloves or  eye protection and making sure you have good ventilation during use of the product.  Remove gloves and wash hands immediately after cleaning.  Monitor yourself for signs and symptoms of illness Caregivers and household members are considered close contacts, should monitor their health, and will be asked to limit movement outside of the home to the extent possible. Follow the monitoring steps for close contacts listed on the symptom monitoring form.   ? If you have additional questions, contact your local health department or call the epidemiologist on call at [phone] (available 24/7). ? This guidance is subject to change. For the most up-to-date guidance from [facility], please refer to their website: YouBlogs.pl

## 2018-10-21 DIAGNOSIS — I10 Essential (primary) hypertension: Secondary | ICD-10-CM

## 2018-10-21 DIAGNOSIS — J449 Chronic obstructive pulmonary disease, unspecified: Secondary | ICD-10-CM

## 2018-10-21 DIAGNOSIS — I480 Paroxysmal atrial fibrillation: Secondary | ICD-10-CM

## 2018-10-21 LAB — COMPREHENSIVE METABOLIC PANEL
ALT: 14 U/L (ref 0–44)
AST: 16 U/L (ref 15–41)
Albumin: 2.7 g/dL — ABNORMAL LOW (ref 3.5–5.0)
Alkaline Phosphatase: 52 U/L (ref 38–126)
Anion gap: 7 (ref 5–15)
BUN: 24 mg/dL — ABNORMAL HIGH (ref 8–23)
CO2: 24 mmol/L (ref 22–32)
Calcium: 7.8 mg/dL — ABNORMAL LOW (ref 8.9–10.3)
Chloride: 108 mmol/L (ref 98–111)
Creatinine, Ser: 0.8 mg/dL (ref 0.61–1.24)
GFR calc Af Amer: >60 mL/min (ref >60)
GFR calc non Af Amer: >60 mL/min (ref >60)
Glucose, Bld: 128 mg/dL — ABNORMAL HIGH (ref 70–99)
Potassium: 3.5 mmol/L (ref 3.5–5.1)
Sodium: 139 mmol/L (ref 135–145)
Total Bilirubin: 0.5 mg/dL (ref 0.3–1.2)
Total Protein: 5.8 g/dL — ABNORMAL LOW (ref 6.5–8.1)

## 2018-10-21 LAB — CBC WITH DIFFERENTIAL/PLATELET
Abs Immature Granulocytes: 0.01 10*3/uL (ref 0.00–0.07)
Basophils Absolute: 0 10*3/uL (ref 0.0–0.1)
Basophils Relative: 0 %
Eosinophils Absolute: 0 10*3/uL (ref 0.0–0.5)
Eosinophils Relative: 0 %
HCT: 35.6 % — ABNORMAL LOW (ref 39.0–52.0)
Hemoglobin: 11.6 g/dL — ABNORMAL LOW (ref 13.0–17.0)
Immature Granulocytes: 0 %
Lymphocytes Relative: 26 %
Lymphs Abs: 1.2 10*3/uL (ref 0.7–4.0)
MCH: 33.7 pg (ref 26.0–34.0)
MCHC: 32.6 g/dL (ref 30.0–36.0)
MCV: 103.5 fL — ABNORMAL HIGH (ref 80.0–100.0)
Monocytes Absolute: 0.4 10*3/uL (ref 0.1–1.0)
Monocytes Relative: 10 %
Neutro Abs: 3 10*3/uL (ref 1.7–7.7)
Neutrophils Relative %: 64 %
Platelets: 133 10*3/uL — ABNORMAL LOW (ref 150–400)
RBC: 3.44 MIL/uL — ABNORMAL LOW (ref 4.22–5.81)
RDW: 13 % (ref 11.5–15.5)
WBC: 4.6 10*3/uL (ref 4.0–10.5)
nRBC: 0 % (ref 0.0–0.2)

## 2018-10-21 LAB — GLUCOSE, CAPILLARY
Glucose-Capillary: 250 mg/dL — ABNORMAL HIGH (ref 70–99)
Glucose-Capillary: 183 mg/dL — ABNORMAL HIGH (ref 70–99)
Glucose-Capillary: 189 mg/dL — ABNORMAL HIGH (ref 70–99)

## 2018-10-21 LAB — D-DIMER, QUANTITATIVE: D-Dimer, Quant: 4.55 ug/mL-FEU — ABNORMAL HIGH (ref 0.00–0.50)

## 2018-10-21 LAB — C-REACTIVE PROTEIN: CRP: 4.6 mg/dL — ABNORMAL HIGH (ref <1.0)

## 2018-10-21 NOTE — Care Management (Signed)
CM acknowledges [facility] consult however pt is not currently appropriate for [facility] referral.  Per documentation in Epic pt is from [facility].  TOC will continue to follow for appropriate discharge disposiiton

## 2018-10-21 NOTE — Progress Notes (Signed)
PROGRESS NOTE                                                                                                                                                                                                             Patient Demographics:    Chase Simmons, is a 85 y.o. male, DOB - [birth year], [id number]  Outpatient Primary MD for the patient is Leone Haven, MD   Admit date - 10/20/2018   LOS - 1  No chief complaint on file.      Brief Narrative: Patient is a 85 y.o. male with PMHx of phonic systolic heart failure, advanced dementia, essential tremor, DM-2, HTN-resident of SNF-transferred to [facility] ED on 10/15 after he was found to have Covid positive.  He was subsequently transferred to [facility] for further evaluation.   Subjective:    Mohsin Gruhlke remains very confused-spoke with RN-no major events overnight.   Assessment  & Plan :   COVID-19 infection: Appears asymptomatic-no pneumonia on chest x-ray done on 10/17 as well.  However CRP is now trending up.  Plans are to continue IV Decadron-and monitor closely.  Spoke with daughter this morning-if he does become symptomatic and deteriorate-apart from adding Remdesivir-no other options for any further escalation in care in this frail demented patient.  Fever: afebrile  O2 requirements: On RA  COVID-19 Labs: Recent Labs    10/20/18 0720 10/21/18 0315  DDIMER 8.12* 4.55*  FERRITIN 187  --   CRP 2.5* 4.6*    Lab Results  Component Value Date   SARSCOV2NAA NEGATIVE 06/12/2018   [place] NEGATIVE 06/06/2018     COVID-19 Medications: Steroids: 10/15>> Remdesivir: Not indicated as not hypoxic-no pneumonia on imaging.   Actemra: Not indicated Convalescent Plasma: Not indicated Research Studies:N/A  Other medications: Diuretics:Euvolemic-no need for lasix Antibiotics:Not needed as no evidence of bacterial  infection  Prone/Incentive Spirometry: Given dementia-will not cooperate with these measures.  DVT Prophylaxis  :  Lovenox at twice daily dosing-given D-dimer more than 5.  Chronic systolic heart failure (EF 45-50% on 01/16/2017): Remains euvolemic-no indication for diuretics at this point.    PAF: Rate controlled-clearly not a candidate for anticoagulation given frailty/advanced age/advanced dementia-continue aspirin.  DM-2: Hold oral hypoglycemic medications-continue SSI.  HTN: Resume amlodipine and follow.  COPD: Stable-continue bronchodilators  Nonobstructive CAD (by [facility] July 2018): Asymptomatic-no  anginal symptoms.  Essential tremor: Continue primidone.  GERD: Continue PPI  Dementia with delirium: Probably has advanced dementia at baseline-exhibits frailty as well.  Continue Paxil, Seroquel and trazodone.  Palliative care: MOST form present in paper chart-DNR-appears to suggest only gentle medical treatment.   Debility/deconditioning/frailty: Very frail-debilitated at baseline-appreciate SLP eval.  Await PT/OT eval.   ABG:    Component Value Date/Time   PHART 7.36 06/20/2015 2124   PCO2ART 46 06/20/2015 2124   PO2ART 158 (H) 06/20/2015 2124   HCO3 26.0 06/20/2015 2124   ACIDBASEDEF 0.1 06/20/2015 2124   O2SAT 100.3 06/20/2015 2124    Vent Settings: N/A   Condition - Stable  Family Communication  : Spoke with daughter over the phone  Code Status :  DNR  Diet :  Diet Order            Diet Heart Room service appropriate? Yes; Fluid consistency: Thin  Diet effective now               Disposition Plan  :  Remain hospitalized-back to SNF on discharge.  Barriers to discharge: Very frail-at risk for severe disease-continue IV steroids for a few more days.  Consults  :  None  Procedures  :  None  GI prophylaxis: PPI  Antibiotics  :    Anti-infectives (From admission, onward)   None      Inpatient Medications  Scheduled Meds:  amLODipine  2.5  mg Oral Daily   aspirin EC  81 mg Oral Daily   atorvastatin  20 mg Oral q1800   dexamethasone (DECADRON) injection  6 mg Intravenous Q24H   enoxaparin (LOVENOX) injection  40 mg Subcutaneous Q12H   gabapentin  100 mg Oral QHS   insulin aspart  0-9 Units Subcutaneous TID WC   pantoprazole  40 mg Oral Daily   PARoxetine  30 mg Oral Daily   primidone  100 mg Oral TID   QUEtiapine  50 mg Oral TID   sodium chloride flush  3 mL Intravenous Q12H   tiotropium  18 mcg Inhalation Daily   traZODone  100 mg Oral QHS   vitamin C  500 mg Oral Daily   zinc sulfate  220 mg Oral Daily   Continuous Infusions:  sodium chloride     PRN Meds:.sodium chloride, sodium chloride flush   Time Spent in minutes  25  See all Orders from today for further details   Oren Binet M.D on 10/21/2018 at 9:12 AM  To page go to [email or web address] - use universal password  [facility] -  Office  [phone]    Objective:   Vitals:   10/20/18 2003 10/21/18 0357 10/21/18 0737 10/21/18 0854  BP: 112/83 110/61 (!) 126/97   Pulse:  (!) 48 62   Resp:  (!) 24 13   Temp: 97.6 F (36.4 C) 99.7 F (37.6 C) 98.3 F (36.8 C)   TempSrc: Axillary Axillary Axillary   SpO2:  99% 98% 98%  Weight:      Height:        Wt Readings from Last 3 Encounters:  10/20/18 85.6 kg  10/18/18 77.1 kg  06/06/18 90.7 kg     Intake/Output Summary (Last 24 hours) at 10/21/2018 0911 Last data filed at 10/21/2018 0035 Gross per 24 hour  Intake 1000 ml  Output 575 ml  Net 425 ml     Physical Exam Gen Exam: Awake-very demented-confused-frail/chronically sick appearing.  Tremors+ HEENT:atraumatic, normocephalic Chest: B/L clear to auscultation anteriorly CVS:S1S2  regular Abdomen:soft non tender, non distended Extremities:no edema Neurology: Appears to move all 4 extremities-nonfocal exam Skin: no rash   Data Review:    CBC Recent Labs  Lab 10/18/18 1721 10/19/18 0510 10/20/18 0720  10/21/18 0315  WBC 4.3 2.0* 5.6 4.6  HGB 12.9* 12.2* 13.4 11.6*  HCT 38.9* 37.1* 41.0 35.6*  PLT 126* 125* 142* 133*  MCV 102.9* 102.5* 103.8* 103.5*  MCH 34.1* 33.7 33.9 33.7  MCHC 33.2 32.9 32.7 32.6  RDW 13.1 13.1 12.8 13.0  LYMPHSABS 1.0  --  0.9 1.2  MONOABS 0.4  --  0.5 0.4  EOSABS 0.0  --  0.0 0.0  BASOSABS 0.0  --  0.0 0.0    Chemistries  Recent Labs  Lab 10/18/18 1721 10/18/18 1847 10/19/18 0510 10/20/18 0720 10/21/18 0315  NA 145  --  147* 144 139  K 3.6  --  4.3 3.2* 3.5  CL 116*  --  114* 108 108  CO2 24  --  26 26 24   GLUCOSE 80  --  228* 137* 128*  BUN 25*  --  26* 29* 24*  CREATININE 0.83  --  1.00 0.94 0.80  CALCIUM 8.3*  --  8.4* 8.3* 7.8*  AST  --  21  --  20 16  ALT  --  15  --  17 14  ALKPHOS  --  54  --  63 52  BILITOT  --  0.5  --  0.8 0.5   ------------------------------------------------------------------------------------------------------------------ No results for input(s): CHOL, HDL, LDLCALC, TRIG, CHOLHDL, LDLDIRECT in the last 72 hours.  Lab Results  Component Value Date   HGBA1C 6.0 (H) 10/20/2018   ------------------------------------------------------------------------------------------------------------------ No results for input(s): TSH, T4TOTAL, T3FREE, THYROIDAB in the last 72 hours.  Invalid input(s): FREET3 ------------------------------------------------------------------------------------------------------------------ Recent Labs    10/20/18 0720  FERRITIN 187    Coagulation profile No results for input(s): INR, PROTIME in the last 168 hours.  Recent Labs    10/20/18 0720 10/21/18 0315  DDIMER 8.12* 4.55*    Cardiac Enzymes No results for input(s): CKMB, TROPONINI, MYOGLOBIN in the last 168 hours.  Invalid input(s): CK ------------------------------------------------------------------------------------------------------------------    Component Value Date/Time   BNP 285.0 (H) 06/06/2018 1600   BNP 51  11/08/2016 1656    Micro Results Recent Results (from the past 240 hour(s))  MRSA PCR Screening     Status: None   Collection Time: 10/20/18  6:55 AM   Specimen: Nasal Mucosa; Nasopharyngeal  Result Value Ref Range Status   MRSA by PCR NEGATIVE NEGATIVE Final    Comment:        The GeneXpert MRSA Assay (FDA approved for NASAL specimens only), is one component of a comprehensive MRSA colonization surveillance program. It is not intended to diagnose MRSA infection nor to guide or monitor treatment for MRSA infections. Performed at [facility], [place] [address], [place], [place] [address]     Radiology Reports Dg Chest Port 1 View  Result Date: 10/20/2018 CLINICAL DATA:  Shortness of breath.  COVID-19 virus infection. EXAM: PORTABLE CHEST 1 VIEW COMPARISON:  10/18/2018 FINDINGS: The heart size and mediastinal contours are within normal limits. Aortic atherosclerosis. Stable prominence of pulmonary interstitial markings. No evidence of pulmonary airspace disease or pleural effusion. The visualized skeletal structures are unremarkable. IMPRESSION: Stable exam.  No active disease. Electronically Signed   By: Marlaine Hind M.D.   On: 10/20/2018 12:12   Dg Chest Portable 1 View  Result Date: 10/18/2018 CLINICAL  DATA:  COVID-19 positive. Diabetes. Hypertension. COPD. Bladder cancer. EXAM: PORTABLE CHEST 1 VIEW COMPARISON:  06/06/2018 FINDINGS: Two frontal views of the chest. Midline trachea. Borderline cardiomegaly. Atherosclerosis in the transverse aorta. No pleural effusion or pneumothorax. Moderate pulmonary interstitial thickening is not significantly changed. Bibasilar opacities, favored to represent scarring or subsegmental atelectasis, similar. IMPRESSION: Chronic interstitial thickening, likely related to the clinical history of COPD. No acute superimposed process. Aortic Atherosclerosis (ICD10-I70.0). Electronically Signed   By: Abigail Miyamoto M.D.   On: 10/18/2018  18:33

## 2018-10-22 ENCOUNTER — Encounter (HOSPITAL_COMMUNITY): Payer: Self-pay | Admitting: *Deleted

## 2018-10-22 LAB — GLUCOSE, CAPILLARY
Glucose-Capillary: 93 mg/dL (ref 70–99)
Glucose-Capillary: 122 mg/dL — ABNORMAL HIGH (ref 70–99)
Glucose-Capillary: 109 mg/dL — ABNORMAL HIGH (ref 70–99)
Glucose-Capillary: 240 mg/dL — ABNORMAL HIGH (ref 70–99)
Glucose-Capillary: 205 mg/dL — ABNORMAL HIGH (ref 70–99)

## 2018-10-22 LAB — CBC WITH DIFFERENTIAL/PLATELET
Abs Immature Granulocytes: 0.02 10*3/uL (ref 0.00–0.07)
Basophils Absolute: 0 10*3/uL (ref 0.0–0.1)
Basophils Relative: 0 %
Eosinophils Absolute: 0 10*3/uL (ref 0.0–0.5)
Eosinophils Relative: 0 %
HCT: 36.4 % — ABNORMAL LOW (ref 39.0–52.0)
Hemoglobin: 12 g/dL — ABNORMAL LOW (ref 13.0–17.0)
Immature Granulocytes: 0 %
Lymphocytes Relative: 25 %
Lymphs Abs: 1.5 10*3/uL (ref 0.7–4.0)
MCH: 33.7 pg (ref 26.0–34.0)
MCHC: 33 g/dL (ref 30.0–36.0)
MCV: 102.2 fL — ABNORMAL HIGH (ref 80.0–100.0)
Monocytes Absolute: 0.5 10*3/uL (ref 0.1–1.0)
Monocytes Relative: 8 %
Neutro Abs: 4 10*3/uL (ref 1.7–7.7)
Neutrophils Relative %: 67 %
Platelets: 121 10*3/uL — ABNORMAL LOW (ref 150–400)
RBC: 3.56 MIL/uL — ABNORMAL LOW (ref 4.22–5.81)
RDW: 12.5 % (ref 11.5–15.5)
WBC: 6 10*3/uL (ref 4.0–10.5)
nRBC: 0 % (ref 0.0–0.2)

## 2018-10-22 LAB — COMPREHENSIVE METABOLIC PANEL
ALT: 17 U/L (ref 0–44)
AST: 21 U/L (ref 15–41)
Albumin: 2.7 g/dL — ABNORMAL LOW (ref 3.5–5.0)
Alkaline Phosphatase: 57 U/L (ref 38–126)
Anion gap: 11 (ref 5–15)
BUN: 21 mg/dL (ref 8–23)
CO2: 24 mmol/L (ref 22–32)
Calcium: 7.6 mg/dL — ABNORMAL LOW (ref 8.9–10.3)
Chloride: 99 mmol/L (ref 98–111)
Creatinine, Ser: 0.7 mg/dL (ref 0.61–1.24)
GFR calc Af Amer: >60 mL/min (ref >60)
GFR calc non Af Amer: >60 mL/min (ref >60)
Glucose, Bld: 108 mg/dL — ABNORMAL HIGH (ref 70–99)
Potassium: 3.8 mmol/L (ref 3.5–5.1)
Sodium: 134 mmol/L — ABNORMAL LOW (ref 135–145)
Total Bilirubin: 0.7 mg/dL (ref 0.3–1.2)
Total Protein: 6 g/dL — ABNORMAL LOW (ref 6.5–8.1)

## 2018-10-22 LAB — D-DIMER, QUANTITATIVE: D-Dimer, Quant: 3.57 ug/mL-FEU — ABNORMAL HIGH (ref 0.00–0.50)

## 2018-10-22 LAB — C-REACTIVE PROTEIN: CRP: 7 mg/dL — ABNORMAL HIGH (ref <1.0)

## 2018-10-22 MED ORDER — AMLODIPINE BESYLATE 5 MG PO TABS
2.5000 mg | ORAL_TABLET | Freq: Once | ORAL | Status: AC
  Administered 2018-10-22: 2.5 mg via ORAL
  Filled 2018-10-22: qty 1

## 2018-10-22 MED ORDER — AMLODIPINE BESYLATE 5 MG PO TABS
5.0000 mg | ORAL_TABLET | Freq: Every day | ORAL | Status: DC
  Administered 2018-10-23 – 2018-10-24 (×2): 5 mg via ORAL
  Filled 2018-10-22 (×2): qty 1

## 2018-10-22 MED ORDER — METHYLPREDNISOLONE SODIUM SUCC 40 MG IJ SOLR
40.0000 mg | Freq: Two times a day (BID) | INTRAMUSCULAR | Status: DC
  Administered 2018-10-22 – 2018-10-24 (×5): 40 mg via INTRAVENOUS
  Filled 2018-10-22 (×5): qty 1

## 2018-10-22 NOTE — NC FL2 (Signed)
[place] LEVEL OF CARE SCREENING TOOL     IDENTIFICATION  Patient Name: Chase Simmons Birthdate: [birth year] Sex: male Admission Date (Current Location): 10/20/2018  [facility] and [place] Number:  Herbalist and Address:  [facility], [place] [address] [place], [place] [address]      Provider Number: [id number]  Attending Physician Name and Address:  Jonetta Osgood, MD  Relative Name and Phone Number:       Current Level of Care: Hospital Recommended Level of Care: [place] Prior Approval Number:    Date Approved/Denied:   PASRR Number: [id number]  Discharge Plan: SNF    Current Diagnoses: Patient Active Problem List   Diagnosis Date Noted  . COVID-19 10/19/2018  . COVID-19 virus infection 10/18/2018  . Weakness 06/07/2018  . Skin tear of elbow without complication [id number]  . Lactic acidosis 06/06/2018  . Depression 04/20/2018  . Altered mental status   . Frequency of urination 03/01/2018  . Confusion 03/01/2018  . Bilateral leg edema 11/08/2016  . Vertigo 08/25/2016  . Nausea 08/01/2016  . CHF (congestive heart failure) ([place]) 08/01/2016  . Atypical chest pain 07/20/2016  . COPD (chronic obstructive pulmonary disease) ([place]) 03/15/2016  . Coronary atherosclerosis of native coronary artery 01/05/2016  . Aortic atherosclerosis ([place]) 01/05/2016  . Bradycardia 01/05/2016  . Anxiety 06/17/2015  . Malignant neoplasm of urinary bladder ([place]) 06/17/2015  . Allergic rhinitis 05/14/2015  . Knee pain, bilateral 03/16/2015  . PAF (paroxysmal atrial fibrillation) ([place]) 08/30/2013  . Acid reflux 08/30/2013  . Tremor, essential 10/22/2012  . Hyperlipidemia 10/22/2012  . HTN (hypertension) 08/01/2012  . Type 2 diabetes mellitus with complication, without long-term current use of insulin ([place]) 08/01/2012  . Benign prostatic hyperplasia with urinary obstruction 09/02/2011    Orientation RESPIRATION  BLADDER Height & Weight        Normal External catheter Weight: 188 lb 11.4 oz (85.6 kg) Height:  5\' 9"  (175.3 cm)  BEHAVIORAL SYMPTOMS/MOOD NEUROLOGICAL BOWEL NUTRITION STATUS        Diet(see dc summary)  AMBULATORY STATUS COMMUNICATION OF NEEDS Skin   Extensive Assist Verbally                         Personal Care Assistance Level of Assistance  Bathing, Dressing, Feeding Bathing Assistance: Maximum assistance Feeding assistance: Limited assistance Dressing Assistance: Maximum assistance     Functional Limitations Info             SPECIAL CARE FACTORS FREQUENCY  PT (By licensed PT), OT (By licensed OT)     PT Frequency: 5 OT Frequency: 5            Contractures      Additional Factors Info  Code Status, Allergies Code Status Info: DNR Allergies Info: Latex           Current Medications (10/22/2018):  This is the current hospital active medication list Current Facility-Administered Medications  Medication Dose Route Frequency Provider Last Rate Last Dose  . 0.9 %  sodium chloride infusion  250 mL Intravenous PRN Derrill Kay A, MD      . amLODipine (NORVASC) tablet 2.5 mg  2.5 mg Oral Once Jonetta Osgood, MD      . Derrill Memo ON 10/23/2018] amLODipine (NORVASC) tablet 5 mg  5 mg Oral Daily Ghimire, Henreitta Leber, MD      . aspirin EC tablet 81 mg  81 mg Oral  Daily Jonetta Osgood, MD   81 mg at 10/22/18 0848  . atorvastatin (LIPITOR) tablet 20 mg  20 mg Oral q1800 Jonetta Osgood, MD   20 mg at 10/22/18 0847  . enoxaparin (LOVENOX) injection 40 mg  40 mg Subcutaneous Q12H Jonetta Osgood, MD   40 mg at 10/22/18 0848  . gabapentin (NEURONTIN) capsule 100 mg  100 mg Oral QHS Jonetta Osgood, MD   100 mg at 10/22/18 0847  . insulin aspart (novoLOG) injection 0-9 Units  0-9 Units Subcutaneous TID WC Jonetta Osgood, MD   1 Units at 10/22/18 [phone]  . methylPREDNISolone sodium succinate (SOLU-MEDROL) 40 mg/mL injection 40 mg  40 mg Intravenous Q12H  Ghimire, Shanker M, MD      . pantoprazole (PROTONIX) EC tablet 40 mg  40 mg Oral Daily Jonetta Osgood, MD   40 mg at 10/22/18 0851  . PARoxetine (PAXIL) tablet 30 mg  30 mg Oral Daily Jonetta Osgood, MD   30 mg at 10/22/18 0844  . primidone (MYSOLINE) tablet 100 mg  100 mg Oral TID Jonetta Osgood, MD   100 mg at 10/22/18 0846  . QUEtiapine (SEROQUEL) tablet 50 mg  50 mg Oral TID Jonetta Osgood, MD   50 mg at 10/22/18 0847  . sodium chloride flush (NS) 0.9 % injection 3 mL  3 mL Intravenous Q12H Derrill Kay A, MD   3 mL at 10/22/18 0849  . sodium chloride flush (NS) 0.9 % injection 3 mL  3 mL Intravenous PRN Phillips Grout, MD      . tiotropium [facility]) inhalation capsule 18 mcg  18 mcg Inhalation Daily Jonetta Osgood, MD   18 mcg at 10/21/18 0850  . traZODone (DESYREL) tablet 100 mg  100 mg Oral QHS Jonetta Osgood, MD   100 mg at 10/21/18 2148  . vitamin C (ASCORBIC ACID) tablet 500 mg  500 mg Oral Daily Derrill Kay A, MD   500 mg at 10/22/18 0848  . zinc sulfate capsule 220 mg  220 mg Oral Daily Phillips Grout, MD   220 mg at 10/22/18 [id number]     Discharge Medications: Please see discharge summary for a list of discharge medications.  Relevant Imaging Results:  Relevant Lab Results:   Additional [place], LCSW

## 2018-10-22 NOTE — Evaluation (Signed)
Physical Therapy Evaluation Patient Details Name: Chase Simmons MRN: [id number] DOB: 1933/06/17 Today's Date: 10/22/2018   History of Present Illness  Pt adm with COVID 19. Pt from SNF. PMH - chronic systolic heart failure, advanced dementia, essential tremor, DM-2, HTN  Clinical Impression  Pt presents to PT as dependent with all care and with flexion contractures of LE's. Not able to follow commands or participate in PT. Recommend return to nursing facility at prior level of care.     Follow Up Recommendations SNF(return to nursing facilty at prior level of care)    Equipment Recommendations  None recommended by PT    Recommendations for Other Services       Precautions / Restrictions Precautions Precautions: Fall      Mobility  Bed Mobility Overal bed mobility: Needs Assistance Bed Mobility: Rolling;Supine to Sit Rolling: Total assist   Supine to sit: Total assist     General bed mobility comments: Unable to fully get to the EOB with 1 person total assist  Transfers                 General transfer comment: Unable. Will need mechanical lift  Ambulation/Gait                Stairs            Wheelchair Mobility    Modified Rankin (Stroke Patients Only)       Balance                                             Pertinent Vitals/Pain Pain Assessment: Faces Faces Pain Scale: Hurts little more Pain Location: LE's when trying to passively extend Pain Descriptors / Indicators: Grimacing Pain Intervention(s): Limited activity within patient's tolerance;Repositioned    Home Living Family/patient expects to be discharged to:: Skilled nursing facility                      Prior Function Level of Independence: Needs assistance   Gait / Transfers Assistance Needed: During June admission pt was standing with 2 person assist. Unsure of status at SNF.           Hand Dominance   Dominant Hand: Right     Extremity/Trunk Assessment   Upper Extremity Assessment Upper Extremity Assessment: Difficult to assess due to impaired cognition    Lower Extremity Assessment Lower Extremity Assessment: RLE deficits/detail;LLE deficits/detail RLE Deficits / Details: flexion contractures LLE Deficits / Details: flexion contractures       Communication      Cognition Arousal/Alertness: Awake/alert   Overall Cognitive Status: No family/caregiver present to determine baseline cognitive functioning                                 General Comments: Pt unable to answer basic questions, not following commands      General Comments      Exercises     Assessment/Plan    PT Assessment Patent does not need any further PT services  PT Problem List         PT Treatment Interventions      PT Goals (Current goals can be found in the Care Plan section)  Acute Rehab PT Goals PT Goal Formulation: Patient unable to participate in goal setting  Frequency     Barriers to discharge        Co-evaluation               AM-PAC PT "6 Clicks" Mobility  Outcome Measure Help needed turning from your back to your side while in a flat bed without using bedrails?: Total Help needed moving from lying on your back to sitting on the side of a flat bed without using bedrails?: Total Help needed moving to and from a bed to a chair (including a wheelchair)?: Total Help needed standing up from a chair using your arms (e.g., wheelchair or bedside chair)?: Total Help needed to walk in hospital room?: Total Help needed climbing 3-5 steps with a railing? : Total 6 Click Score: 6    End of Session   Activity Tolerance: Patient tolerated treatment well Patient left: in bed;with call bell/phone within reach;with bed alarm set   PT Visit Diagnosis: Other abnormalities of gait and mobility (R26.89)    Time: [id number] PT Time Calculation (min) (ACUTE ONLY): 11 min   Charges:   PT  Evaluation $PT Eval Low Complexity: 1 [place] Pager [phone] Office [place] 10/22/2018, 11:17 AM

## 2018-10-22 NOTE — TOC Initial Note (Signed)
Transition of Care [facility]) - Initial/Assessment Note    Patient Details  Name: Chase Simmons MRN: [id number] Date of Birth: 06/12/1933  Transition of Care [facility]) CM/SW Contact:    Weston Anna, LCSW Phone Number: 10/22/2018, 2:20 PM  Clinical Narrative:                  CSW spoke with patients daughter, [name], regarding discharge plans. Patient is a long-term resident at [facility]- been there for about 5 months. Plans to return once medically stable- CSW spoke with facility and they are prepared to accept him once ready using his Medicaid benefits.   Expected Discharge Plan: Skilled Nursing Facility Barriers to Discharge: Continued Medical Work up   Patient Goals and CMS Choice Patient states their goals for this hospitalization and ongoing recovery are:: getting back home- per daughter CMS Medicare.gov Compare Post Acute Care list provided to:: Patient Represenative (must comment)(not oriented- spoke with daughter)    Expected Discharge Plan and Services Expected Discharge Plan: [place] In-house Referral: Clinical Social Work     Living arrangements for the past 2 months: [place]                 DME Arranged: N/A         HH Arranged: NA          Prior Living Arrangements/Services Living arrangements for the past 2 months: [place] Lives with:: Facility Resident Patient language and need for interpreter reviewed:: Yes Do you feel safe going back to the place where you live?: Yes      Need for Family Participation in Patient Care: Yes (Comment) Care giver support system in place?: Yes (comment)   Criminal Activity/Legal Involvement Pertinent to Current Situation/Hospitalization: No - Comment as needed  Activities of Daily Living Home Assistive Devices/Equipment: Wheelchair ADL Screening (condition at time of admission) Patient's cognitive ability adequate to safely complete daily activities?: No Is the  patient deaf or have difficulty hearing?: No Does the patient have difficulty seeing, even when wearing glasses/contacts?: No Does the patient have difficulty concentrating, remembering, or making decisions?: Yes Patient able to express need for assistance with ADLs?: No Does the patient have difficulty dressing or bathing?: Yes Independently performs ADLs?: No Communication: Needs assistance Is this a change from baseline?: Pre-admission baseline Dressing (OT): Dependent Is this a change from baseline?: Pre-admission baseline Grooming: Dependent Is this a change from baseline?: Pre-admission baseline Feeding: Dependent Is this a change from baseline?: Pre-admission baseline Bathing: Dependent Is this a change from baseline?: Pre-admission baseline Toileting: Dependent Is this a change from baseline?: Pre-admission baseline In/Out Bed: Dependent Is this a change from baseline?: Pre-admission baseline Walks in Home: Dependent Is this a change from baseline?: Pre-admission baseline Does the patient have difficulty walking or climbing stairs?: Yes Weakness of Legs: Both Weakness of Arms/Hands: Both  Permission Sought/Granted                  Emotional Assessment              Admission diagnosis:  [name] Patient Active Problem List   Diagnosis Date Noted  . COVID-19 10/19/2018  . COVID-19 virus infection 10/18/2018  . Weakness 06/07/2018  . Skin tear of elbow without complication [id number]  . Lactic acidosis 06/06/2018  . Depression 04/20/2018  . Altered mental status   . Frequency of urination 03/01/2018  . Confusion 03/01/2018  . Bilateral leg edema 11/08/2016  . Vertigo 08/25/2016  .  Nausea 08/01/2016  . CHF (congestive heart failure) ([place]) 08/01/2016  . Atypical chest pain 07/20/2016  . COPD (chronic obstructive pulmonary disease) ([place]) 03/15/2016  . Coronary atherosclerosis of native coronary artery 01/05/2016  . Aortic atherosclerosis ([place])  01/05/2016  . Bradycardia 01/05/2016  . Anxiety 06/17/2015  . Malignant neoplasm of urinary bladder ([place]) 06/17/2015  . Allergic rhinitis 05/14/2015  . Knee pain, bilateral 03/16/2015  . PAF (paroxysmal atrial fibrillation) ([place]) 08/30/2013  . Acid reflux 08/30/2013  . Tremor, essential 10/22/2012  . Hyperlipidemia 10/22/2012  . HTN (hypertension) 08/01/2012  . Type 2 diabetes mellitus with complication, without long-term current use of insulin ([place]) 08/01/2012  . Benign prostatic hyperplasia with urinary obstruction 09/02/2011   PCP:  Leone Haven, MD Pharmacy:   [id number] - [place], [place] [address] [place] [address] Phone: [phone] Fax: [phone]     Social Determinants of Health (SDOH) Interventions    Readmission Risk Interventions Readmission Risk Prevention Plan 06/07/2018  Transportation Screening Complete  PCP or Specialist Appt within 3-5 Days Complete  HRI or [place] Complete  Social Work Consult for [place] Planning/Counseling Not Complete  Medication Review Press photographer) Complete  Some recent data might be hidden

## 2018-10-22 NOTE — Progress Notes (Signed)
PROGRESS NOTE                                                                                                                                                                                                             Patient Demographics:    Chase Simmons, is a 85 y.o. male, DOB - 1933-03-14, [id number]  Outpatient Primary MD for the patient is Leone Haven, MD   Admit date - 10/20/2018   LOS - 2  No chief complaint on file.      Brief Narrative: Patient is a 85 y.o. male with PMHx of phonic systolic heart failure, advanced dementia, essential tremor, DM-2, HTN-resident of SNF-transferred to [facility] ED on 10/15 after he was found to have Covid positive.  He was subsequently transferred to [facility] for further evaluation.   Subjective:    Twyman Prusak remains essentially unchanged overnight-Per nursing staff no major events.  He is still very pleasantly confused.   Assessment  & Plan :   COVID-19 infection: No shortness of breath-however CRP is trending up-continue steroids.  He is not a candidate for aggressive care-if he develops severe infection-he will need to be transitioned to comfort measures.   Fever: afebrile  O2 requirements: On RA  COVID-19 Labs: Recent Labs    10/20/18 0720 10/21/18 0315 10/22/18 0340  DDIMER 8.12* 4.55* 3.57*  FERRITIN 187  --   --   CRP 2.5* 4.6* 7.0*    Lab Results  Component Value Date   SARSCOV2NAA NEGATIVE 06/12/2018   [place] NEGATIVE 06/06/2018     COVID-19 Medications: Steroids: 10/15>> Remdesivir: Not indicated as not hypoxic-no pneumonia on imaging.   Actemra: Not indicated Convalescent Plasma: Not indicated Research Studies:N/A  Other medications: Diuretics:Euvolemic-no need for lasix Antibiotics:Not needed as no evidence of bacterial infection  Prone/Incentive Spirometry: Given dementia-will not cooperate with these measures.   DVT Prophylaxis  :  Lovenox at twice daily dosing-given D-dimer more than 5.  Chronic systolic heart failure (EF 45-50% on 01/16/2017): Remains euvolemic-no indication for diuretics.   PAF: Rate controlled-clearly not a candidate for anticoagulation given frailty/advanced age/advanced dementia-continue aspirin.  DM-2: Hold oral hypoglycemic medications-continue SSI-CBGs remained stable.  HTN: BP on the higher side-increase amlodipine to 5 mg-follow.    COPD: Stable-continue bronchodilators  Nonobstructive CAD (by [facility] July 2018): Asymptomatic-no anginal symptoms.  Essential  tremor: Continue primidone.  GERD: Continue PPI  Dementia with delirium: Probably has advanced dementia at baseline-exhibits frailty as well.  Continue Paxil, Seroquel and trazodone.  Palliative care: MOST form present in paper chart-DNR-appears to suggest only gentle medical treatment.   Debility/deconditioning/frailty: Very frail-debilitated at baseline-appreciate SLP eval.  Await PT/OT eval.   ABG:    Component Value Date/Time   PHART 7.36 06/20/2015 2124   PCO2ART 46 06/20/2015 2124   PO2ART 158 (H) 06/20/2015 2124   HCO3 26.0 06/20/2015 2124   ACIDBASEDEF 0.1 06/20/2015 2124   O2SAT 100.3 06/20/2015 2124    Vent Settings: N/A   Condition - Stable  Family Communication  : Spoke with daughter over the phone on 10/19 Code Status :  DNR  Diet :  Diet Order            Diet Heart Room service appropriate? Yes; Fluid consistency: Thin  Diet effective now               Disposition Plan  :  Remain hospitalized-back to SNF on discharge.  Barriers to discharge: Very frail-at risk for severe disease-continue IV steroids for a few more days-as CRP uptrending.  Consults  :  None  Procedures  :  None  GI prophylaxis: PPI  Antibiotics  :    Anti-infectives (From admission, onward)   None      Inpatient Medications  Scheduled Meds: . amLODipine  2.5 mg Oral Daily  . aspirin EC  81 mg  Oral Daily  . atorvastatin  20 mg Oral q1800  . dexamethasone (DECADRON) injection  6 mg Intravenous Q24H  . enoxaparin (LOVENOX) injection  40 mg Subcutaneous Q12H  . gabapentin  100 mg Oral QHS  . insulin aspart  0-9 Units Subcutaneous TID WC  . pantoprazole  40 mg Oral Daily  . PARoxetine  30 mg Oral Daily  . primidone  100 mg Oral TID  . QUEtiapine  50 mg Oral TID  . sodium chloride flush  3 mL Intravenous Q12H  . tiotropium  18 mcg Inhalation Daily  . traZODone  100 mg Oral QHS  . vitamin C  500 mg Oral Daily  . zinc sulfate  220 mg Oral Daily   Continuous Infusions: . sodium chloride     PRN Meds:.sodium chloride, sodium chloride flush   Time Spent in minutes  25  See all Orders from today for further details   Oren Binet M.D on 10/22/2018 at 9:44 AM  To page go to [email or web address] - use universal password  [facility] -  Office  [phone]    Objective:   Vitals:   10/21/18 2156 10/22/18 0537 10/22/18 0719 10/22/18 0846  BP: (!) 150/74 (!) 160/99  (!) 163/80  Pulse: 65 97  (!) 45  Resp: (!) 21 (!) 22    Temp: 99.9 F (37.7 C) 99.2 F (37.3 C)    TempSrc: Oral Oral    SpO2: 97% 100% 96% 97%  Weight:      Height:        Wt Readings from Last 3 Encounters:  10/20/18 85.6 kg  10/18/18 77.1 kg  06/06/18 90.7 kg     Intake/Output Summary (Last 24 hours) at 10/22/2018 0944 Last data filed at 10/22/2018 0500 Gross per 24 hour  Intake -  Output 750 ml  Net -750 ml     Physical Exam Gen Exam:Alert -pleasantly confused-chronically sick appearing. HEENT:atraumatic, normocephalic Chest: B/L clear to auscultation anteriorly CVS:S1S2 regular Abdomen:soft non tender,  non distended Extremities:no edema Neurology: Tremors all over-but nonfocal. Skin: no rash   Data Review:    CBC Recent Labs  Lab 10/18/18 1721 10/19/18 0510 10/20/18 0720 10/21/18 0315 10/22/18 0340  WBC 4.3 2.0* 5.6 4.6 6.0  HGB 12.9* 12.2* 13.4 11.6* 12.0*  HCT  38.9* 37.1* 41.0 35.6* 36.4*  PLT 126* 125* 142* 133* 121*  MCV 102.9* 102.5* 103.8* 103.5* 102.2*  MCH 34.1* 33.7 33.9 33.7 33.7  MCHC 33.2 32.9 32.7 32.6 33.0  RDW 13.1 13.1 12.8 13.0 12.5  LYMPHSABS 1.0  --  0.9 1.2 1.5  MONOABS 0.4  --  0.5 0.4 0.5  EOSABS 0.0  --  0.0 0.0 0.0  BASOSABS 0.0  --  0.0 0.0 0.0    Chemistries  Recent Labs  Lab 10/18/18 1721 10/18/18 1847 10/19/18 0510 10/20/18 0720 10/21/18 0315 10/22/18 0340  NA 145  --  147* 144 139 134*  K 3.6  --  4.3 3.2* 3.5 3.8  CL 116*  --  114* 108 108 99  CO2 24  --  26 26 24 24   GLUCOSE 80  --  228* 137* 128* 108*  BUN 25*  --  26* 29* 24* 21  CREATININE 0.83  --  1.00 0.94 0.80 0.70  CALCIUM 8.3*  --  8.4* 8.3* 7.8* 7.6*  AST  --  21  --  20 16 21   ALT  --  15  --  17 14 17   ALKPHOS  --  54  --  63 52 57  BILITOT  --  0.5  --  0.8 0.5 0.7   ------------------------------------------------------------------------------------------------------------------ No results for input(s): CHOL, HDL, LDLCALC, TRIG, CHOLHDL, LDLDIRECT in the last 72 hours.  Lab Results  Component Value Date   HGBA1C 6.0 (H) 10/20/2018   ------------------------------------------------------------------------------------------------------------------ No results for input(s): TSH, T4TOTAL, T3FREE, THYROIDAB in the last 72 hours.  Invalid input(s): FREET3 ------------------------------------------------------------------------------------------------------------------ Recent Labs    10/20/18 0720  FERRITIN 187    Coagulation profile No results for input(s): INR, PROTIME in the last 168 hours.  Recent Labs    10/21/18 0315 10/22/18 0340  DDIMER 4.55* 3.57*    Cardiac Enzymes No results for input(s): CKMB, TROPONINI, MYOGLOBIN in the last 168 hours.  Invalid input(s): CK ------------------------------------------------------------------------------------------------------------------    Component Value Date/Time   BNP  285.0 (H) 06/06/2018 1600   BNP 51 11/08/2016 1656    Micro Results Recent Results (from the past 240 hour(s))  MRSA PCR Screening     Status: None   Collection Time: 10/20/18  6:55 AM   Specimen: Nasal Mucosa; Nasopharyngeal  Result Value Ref Range Status   MRSA by PCR NEGATIVE NEGATIVE Final    Comment:        The GeneXpert MRSA Assay (FDA approved for NASAL specimens only), is one component of a comprehensive MRSA colonization surveillance program. It is not intended to diagnose MRSA infection nor to guide or monitor treatment for MRSA infections. Performed at [facility], [place] [address], [place], [place] [address]     Radiology Reports Dg Chest Port 1 View  Result Date: 10/20/2018 CLINICAL DATA:  Shortness of breath.  COVID-19 virus infection. EXAM: PORTABLE CHEST 1 VIEW COMPARISON:  10/18/2018 FINDINGS: The heart size and mediastinal contours are within normal limits. Aortic atherosclerosis. Stable prominence of pulmonary interstitial markings. No evidence of pulmonary airspace disease or pleural effusion. The visualized skeletal structures are unremarkable. IMPRESSION: Stable exam.  No active disease. Electronically Signed   By:  Marlaine Hind M.D.   On: 10/20/2018 12:12   Dg Chest Portable 1 View  Result Date: 10/18/2018 CLINICAL DATA:  COVID-19 positive. Diabetes. Hypertension. COPD. Bladder cancer. EXAM: PORTABLE CHEST 1 VIEW COMPARISON:  06/06/2018 FINDINGS: Two frontal views of the chest. Midline trachea. Borderline cardiomegaly. Atherosclerosis in the transverse aorta. No pleural effusion or pneumothorax. Moderate pulmonary interstitial thickening is not significantly changed. Bibasilar opacities, favored to represent scarring or subsegmental atelectasis, similar. IMPRESSION: Chronic interstitial thickening, likely related to the clinical history of COPD. No acute superimposed process. Aortic Atherosclerosis (ICD10-I70.0). Electronically Signed   By:  Abigail Miyamoto M.D.   On: 10/18/2018 18:33

## 2018-10-22 NOTE — Telephone Encounter (Signed)
Mr. Rodi recently admitted with COVID 19 and discharged to [facility].  He is DNR with only gentle medical treatment.  Dr. Erlene Quan made aware.

## 2018-10-22 NOTE — Plan of Care (Signed)
  Problem: Coping: Goal: Psychosocial and spiritual needs will be supported Outcome: Progressing   Problem: Respiratory: Goal: Will maintain a patent airway Outcome: Progressing Goal: Complications related to the disease process, condition or treatment will be avoided or minimized Outcome: Progressing   

## 2018-10-23 ENCOUNTER — Inpatient Hospital Stay (HOSPITAL_COMMUNITY): Payer: Medicare HMO

## 2018-10-23 LAB — CBC WITH DIFFERENTIAL/PLATELET
Abs Immature Granulocytes: 0.02 10*3/uL (ref 0.00–0.07)
Basophils Absolute: 0 10*3/uL (ref 0.0–0.1)
Basophils Relative: 0 %
Eosinophils Absolute: 0 10*3/uL (ref 0.0–0.5)
Eosinophils Relative: 0 %
HCT: 35.8 % — ABNORMAL LOW (ref 39.0–52.0)
Hemoglobin: 11.9 g/dL — ABNORMAL LOW (ref 13.0–17.0)
Immature Granulocytes: 1 %
Lymphocytes Relative: 9 %
Lymphs Abs: 0.4 10*3/uL — ABNORMAL LOW (ref 0.7–4.0)
MCH: 33.7 pg (ref 26.0–34.0)
MCHC: 33.2 g/dL (ref 30.0–36.0)
MCV: 101.4 fL — ABNORMAL HIGH (ref 80.0–100.0)
Monocytes Absolute: 0.2 10*3/uL (ref 0.1–1.0)
Monocytes Relative: 4 %
Neutro Abs: 3.7 10*3/uL (ref 1.7–7.7)
Neutrophils Relative %: 86 %
Platelets: 125 10*3/uL — ABNORMAL LOW (ref 150–400)
RBC: 3.53 MIL/uL — ABNORMAL LOW (ref 4.22–5.81)
RDW: 12.5 % (ref 11.5–15.5)
WBC: 4.4 10*3/uL (ref 4.0–10.5)
nRBC: 0 % (ref 0.0–0.2)

## 2018-10-23 LAB — COMPREHENSIVE METABOLIC PANEL
ALT: 19 U/L (ref 0–44)
AST: 17 U/L (ref 15–41)
Albumin: 2.6 g/dL — ABNORMAL LOW (ref 3.5–5.0)
Alkaline Phosphatase: 52 U/L (ref 38–126)
Anion gap: 10 (ref 5–15)
BUN: 23 mg/dL (ref 8–23)
CO2: 23 mmol/L (ref 22–32)
Calcium: 8.1 mg/dL — ABNORMAL LOW (ref 8.9–10.3)
Chloride: 106 mmol/L (ref 98–111)
Creatinine, Ser: 0.72 mg/dL (ref 0.61–1.24)
GFR calc Af Amer: >60 mL/min (ref >60)
GFR calc non Af Amer: >60 mL/min (ref >60)
Glucose, Bld: 189 mg/dL — ABNORMAL HIGH (ref 70–99)
Potassium: 3.6 mmol/L (ref 3.5–5.1)
Sodium: 139 mmol/L (ref 135–145)
Total Bilirubin: 0.5 mg/dL (ref 0.3–1.2)
Total Protein: 5.9 g/dL — ABNORMAL LOW (ref 6.5–8.1)

## 2018-10-23 LAB — C-REACTIVE PROTEIN: CRP: 9.6 mg/dL — ABNORMAL HIGH (ref <1.0)

## 2018-10-23 LAB — GLUCOSE, CAPILLARY
Glucose-Capillary: 201 mg/dL — ABNORMAL HIGH (ref 70–99)
Glucose-Capillary: 189 mg/dL — ABNORMAL HIGH (ref 70–99)
Glucose-Capillary: 227 mg/dL — ABNORMAL HIGH (ref 70–99)
Glucose-Capillary: 148 mg/dL — ABNORMAL HIGH (ref 70–99)

## 2018-10-23 LAB — D-DIMER, QUANTITATIVE: D-Dimer, Quant: 3.4 ug/mL-FEU — ABNORMAL HIGH (ref 0.00–0.50)

## 2018-10-23 NOTE — Plan of Care (Signed)
No acute changes this shift. VS stable. Call light in reach. Fall precautions maintained.   Problem: Education: Goal: Knowledge of risk factors and measures for prevention of condition will improve Outcome: Progressing   Problem: Coping: Goal: Psychosocial and spiritual needs will be supported Outcome: Progressing   Problem: Respiratory: Goal: Will maintain a patent airway Outcome: Progressing Goal: Complications related to the disease process, condition or treatment will be avoided or minimized Outcome: Progressing

## 2018-10-23 NOTE — Consult Note (Signed)
   [facility] CM Inpatient Consult   10/23/2018  Chase Simmons 04/29/1933 [id number]    Patientwas screened forless than 7 day readmission with a 31% extreme high risk score for unplanned readmission and hospitalizations, under his Medicare/ [employer] ACO plan; and to check for [facility] care management service needs. Patient had been engaged by [facility] Coach for diabetes management in the past.     Per chart review and MD brief narrative show as follows: Patient is an 85 y.o. male with PMHx of chronic systolic heart failure, advanced dementia, essential tremor, DM-2, HTN, a resident of SNF- transferred to [facility] ED on 10/15 after he was found to have Covid positive.  He was subsequently transferred to [facility] for further evaluation.  Review of medical record revealsthatpatient currently lives at skilled nursing facility - [facility] (been there about 5 months) .  Transition of care SW notestates that patient is a long-term resident at [facility] with plans to return back to the facility once medically stable, as per PT recommendation.   No [facility] Care Management needs identified at this point for post hospital follow-up as patient's care will be met at that level of care.   For questions and additional information, please call:   A. , BSN, RN-BC [facility] Hospital Liaison Cell: [phone]  

## 2018-10-23 NOTE — Progress Notes (Signed)
Spoke with patient's daughter [name]. Provided updates on patient progress and answered the questions she had at this time.

## 2018-10-23 NOTE — Progress Notes (Addendum)
PROGRESS NOTE                                                                                                                                                                                                             Patient Demographics:    Chase Simmons, is a 85 y.o. male, DOB - 03-Jun-1933, IO:[id number]  Outpatient Primary MD for the patient is Leone Haven, MD   Admit date - 10/20/2018   LOS - 3  No chief complaint on file.      Brief Narrative: Patient is a 85 y.o. male with PMHx of phonic systolic heart failure, advanced dementia, essential tremor, DM-2, HTN-resident of SNF-transferred to [facility] ED on 10/15 after he was found to have Covid positive.  He was subsequently transferred to [facility] for further evaluation.   Subjective:    Chase Simmons remains essentially unchanged-still very confused-and tremors   Assessment  & Plan :   COVID-19 infection: No SOB-on room air-however CRP continues to creep up.High risk for development of PNA-repeat CXR tomorrow am. Continue IV steroids.   Fever: afebrile  O2 requirements: On RA  COVID-19 Labs: Recent Labs    10/21/18 0315 10/22/18 0340 10/23/18 0315  DDIMER 4.55* 3.57* 3.40*  CRP 4.6* 7.0* 9.6*    Lab Results  Component Value Date   SARSCOV2NAA NEGATIVE 06/12/2018   Jance NEGATIVE 06/06/2018     COVID-19 Medications: Steroids: 10/15>> Remdesivir: Not indicated as not hypoxic-no pneumonia on imaging.   Actemra: Not indicated Convalescent Plasma: Not indicated Research Studies:N/A  Other medications: Diuretics:Euvolemic-no need for lasix Antibiotics:Not needed as no evidence of bacterial infection  Prone/Incentive Spirometry: Given dementia-will not cooperate with these measures.  DVT Prophylaxis  :  Lovenox at twice daily dosing-given D-dimer more than 5.  Chronic systolic heart failure (EF 45-50% on 01/16/2017):  Remains euvolemic-no indication for diuretics.   PAF: Rate controlled-clearly not a candidate for anticoagulation given frailty/advanced age/advanced dementia-continue aspirin.  DM-2: Hold oral hypoglycemic medications-continue SSI-CBGs remained stable.  HTN: BP controlled-continue Amlodipine.      COPD: Stable-continue bronchodilators  Nonobstructive CAD (by [facility] July 2018): Asymptomatic-no anginal symptoms.  Essential tremor: Continue primidone.  GERD: Continue PPI  Dementia with delirium: Probably has advanced dementia at baseline-exhibits frailty as well.  Continue Paxil, Seroquel and trazodone.  Palliative care: MOST form present in  paper chart-DNR-appears to suggest only gentle medical treatment.   Debility/deconditioning/frailty: Very frail-debilitated at baseline-appreciate SLP eval.  Await PT/OT eval.   ABG:    Component Value Date/Time   PHART 7.36 06/20/2015 2124   PCO2ART 46 06/20/2015 2124   PO2ART 158 (H) 06/20/2015 2124   HCO3 26.0 06/20/2015 2124   ACIDBASEDEF 0.1 06/20/2015 2124   O2SAT 100.3 06/20/2015 2124    Vent Settings: N/A   Condition - Stable  Family Communication  : left voicemail for daughter-[name] Code Status :  DNR  Diet :  Diet Order            Diet Heart Room service appropriate? Yes; Fluid consistency: Thin  Diet effective now               Disposition Plan  :  Remain hospitalized-back to SNF on discharge.  Barriers to discharge: Very frail-at risk for severe disease-continue IV steroids for a few more days-as CRP uptrending.  Consults  :  None  Procedures  :  None  GI prophylaxis: PPI  Antibiotics  :    Anti-infectives (From admission, onward)   None      Inpatient Medications  Scheduled Meds: . amLODipine  5 mg Oral Daily  . aspirin EC  81 mg Oral Daily  . atorvastatin  20 mg Oral q1800  . enoxaparin (LOVENOX) injection  40 mg Subcutaneous Q12H  . gabapentin  100 mg Oral QHS  . insulin aspart  0-9 Units  Subcutaneous TID WC  . methylPREDNISolone (SOLU-MEDROL) injection  40 mg Intravenous Q12H  . pantoprazole  40 mg Oral Daily  . PARoxetine  30 mg Oral Daily  . primidone  100 mg Oral TID  . QUEtiapine  50 mg Oral TID  . sodium chloride flush  3 mL Intravenous Q12H  . tiotropium  18 mcg Inhalation Daily  . traZODone  100 mg Oral QHS  . vitamin C  500 mg Oral Daily  . zinc sulfate  220 mg Oral Daily   Continuous Infusions: . sodium chloride     PRN Meds:.sodium chloride, sodium chloride flush   Time Spent in minutes  25  See all Orders from today for further details   Oren Binet M.D on 10/23/2018 at 11:46 AM  To page go to [email or web address] - use universal password  [facility] -  Office  [phone]    Objective:   Vitals:   10/22/18 1302 10/22/18 1948 10/23/18 0330 10/23/18 0721  BP: 111/64 (!) 97/57 122/60 115/60  Pulse:  64 60 62  Resp:  20 18 16   Temp:  98.7 F (37.1 C) 98.3 F (36.8 C) 98.1 F (36.7 C)  TempSrc:  Oral Axillary Axillary  SpO2:  96% 99% 100%  Weight:      Height:        Wt Readings from Last 3 Encounters:  10/20/18 85.6 kg  10/18/18 77.1 kg  06/06/18 90.7 kg     Intake/Output Summary (Last 24 hours) at 10/23/2018 1146 Last data filed at 10/22/2018 2316 Gross per 24 hour  Intake 683 ml  Output 250 ml  Net 433 ml     Physical Exam Gen Exam: confused-not in any distress HEENT:atraumatic, normocephalic Chest: B/L clear to auscultation anteriorly CVS:S1S2 regular Abdomen:soft non tender, non distended Extremities:no edema Neurology: Non focal-but with significant weakness. + tremors Skin: no rash   Data Review:    CBC Recent Labs  Lab 10/18/18 1721 10/19/18 0510 10/20/18 0720 10/21/18 0315 10/22/18 0340 10/23/18  0315  WBC 4.3 2.0* 5.6 4.6 6.0 4.4  HGB 12.9* 12.2* 13.4 11.6* 12.0* 11.9*  HCT 38.9* 37.1* 41.0 35.6* 36.4* 35.8*  PLT 126* 125* 142* 133* 121* 125*  MCV 102.9* 102.5* 103.8* 103.5* 102.2* 101.4*   MCH 34.1* 33.7 33.9 33.7 33.7 33.7  MCHC 33.2 32.9 32.7 32.6 33.0 33.2  RDW 13.1 13.1 12.8 13.0 12.5 12.5  LYMPHSABS 1.0  --  0.9 1.2 1.5 0.4*  MONOABS 0.4  --  0.5 0.4 0.5 0.2  EOSABS 0.0  --  0.0 0.0 0.0 0.0  BASOSABS 0.0  --  0.0 0.0 0.0 0.0    Chemistries  Recent Labs  Lab 10/18/18 1847 10/19/18 0510 10/20/18 0720 10/21/18 0315 10/22/18 0340 10/23/18 0315  NA  --  147* 144 139 134* 139  K  --  4.3 3.2* 3.5 3.8 3.6  CL  --  114* 108 108 99 106  CO2  --  26 26 24 24 23   GLUCOSE  --  228* 137* 128* 108* 189*  BUN  --  26* 29* 24* 21 23  CREATININE  --  1.00 0.94 0.80 0.70 0.72  CALCIUM  --  8.4* 8.3* 7.8* 7.6* 8.1*  AST 21  --  20 16 21 17   ALT 15  --  17 14 17 19   ALKPHOS 54  --  63 52 57 52  BILITOT 0.5  --  0.8 0.5 0.7 0.5   ------------------------------------------------------------------------------------------------------------------ No results for input(s): CHOL, HDL, LDLCALC, TRIG, CHOLHDL, LDLDIRECT in the last 72 hours.  Lab Results  Component Value Date   HGBA1C 6.0 (H) 10/20/2018   ------------------------------------------------------------------------------------------------------------------ No results for input(s): TSH, T4TOTAL, T3FREE, THYROIDAB in the last 72 hours.  Invalid input(s): FREET3 ------------------------------------------------------------------------------------------------------------------ No results for input(s): VITAMINB12, FOLATE, FERRITIN, TIBC, IRON, RETICCTPCT in the last 72 hours.  Coagulation profile No results for input(s): INR, PROTIME in the last 168 hours.  Recent Labs    10/22/18 0340 10/23/18 0315  DDIMER 3.57* 3.40*    Cardiac Enzymes No results for input(s): CKMB, TROPONINI, MYOGLOBIN in the last 168 hours.  Invalid input(s): CK ------------------------------------------------------------------------------------------------------------------    Component Value Date/Time   BNP 285.0 (H) 06/06/2018 1600    BNP 51 11/08/2016 1656    Micro Results Recent Results (from the past 240 hour(s))  MRSA PCR Screening     Status: None   Collection Time: 10/20/18  6:55 AM   Specimen: Nasal Mucosa; Nasopharyngeal  Result Value Ref Range Status   MRSA by PCR NEGATIVE NEGATIVE Final    Comment:        The GeneXpert MRSA Assay (FDA approved for NASAL specimens only), is one component of a comprehensive MRSA colonization surveillance program. It is not intended to diagnose MRSA infection nor to guide or monitor treatment for MRSA infections. Performed at [facility], [place] [address] [place], [place] [address]     Radiology Reports Dg Chest Port 1 View  Result Date: 10/20/2018 CLINICAL DATA:  Shortness of breath.  COVID-19 virus infection. EXAM: PORTABLE CHEST 1 VIEW COMPARISON:  10/18/2018 FINDINGS: The heart size and mediastinal contours are within normal limits. Aortic atherosclerosis. Stable prominence of pulmonary interstitial markings. No evidence of pulmonary airspace disease or pleural effusion. The visualized skeletal structures are unremarkable. IMPRESSION: Stable exam.  No active disease. Electronically Signed   By: Marlaine Hind M.D.   On: 10/20/2018 12:12   Dg Chest Portable 1 View  Result Date: 10/18/2018 CLINICAL DATA:  COVID-19 positive. Diabetes.  Hypertension. COPD. Bladder cancer. EXAM: PORTABLE CHEST 1 VIEW COMPARISON:  06/06/2018 FINDINGS: Two frontal views of the chest. Midline trachea. Borderline cardiomegaly. Atherosclerosis in the transverse aorta. No pleural effusion or pneumothorax. Moderate pulmonary interstitial thickening is not significantly changed. Bibasilar opacities, favored to represent scarring or subsegmental atelectasis, similar. IMPRESSION: Chronic interstitial thickening, likely related to the clinical history of COPD. No acute superimposed process. Aortic Atherosclerosis (ICD10-I70.0). Electronically Signed   By: Abigail Miyamoto M.D.   On:  10/18/2018 18:33

## 2018-10-23 NOTE — Progress Notes (Signed)
   10/23/18 1100  Family/Significant Other Communication  Family/Significant Other Update Called;Updated (daughter [name] )

## 2018-10-24 DIAGNOSIS — R69 Illness, unspecified: Secondary | ICD-10-CM | POA: Diagnosis not present

## 2018-10-24 DIAGNOSIS — I10 Essential (primary) hypertension: Secondary | ICD-10-CM | POA: Diagnosis not present

## 2018-10-24 DIAGNOSIS — E1165 Type 2 diabetes mellitus with hyperglycemia: Secondary | ICD-10-CM | POA: Diagnosis not present

## 2018-10-24 DIAGNOSIS — M62462 Contracture of muscle, left lower leg: Secondary | ICD-10-CM | POA: Diagnosis not present

## 2018-10-24 DIAGNOSIS — U071 COVID-19: Secondary | ICD-10-CM | POA: Diagnosis not present

## 2018-10-24 DIAGNOSIS — M62461 Contracture of muscle, right lower leg: Secondary | ICD-10-CM | POA: Diagnosis not present

## 2018-10-24 DIAGNOSIS — R251 Tremor, unspecified: Secondary | ICD-10-CM | POA: Diagnosis not present

## 2018-10-24 DIAGNOSIS — K219 Gastro-esophageal reflux disease without esophagitis: Secondary | ICD-10-CM | POA: Diagnosis not present

## 2018-10-24 LAB — COMPREHENSIVE METABOLIC PANEL
ALT: 21 U/L (ref 0–44)
AST: 22 U/L (ref 15–41)
Albumin: 2.9 g/dL — ABNORMAL LOW (ref 3.5–5.0)
Alkaline Phosphatase: 60 U/L (ref 38–126)
Anion gap: 10 (ref 5–15)
BUN: 31 mg/dL — ABNORMAL HIGH (ref 8–23)
CO2: 25 mmol/L (ref 22–32)
Calcium: 8.6 mg/dL — ABNORMAL LOW (ref 8.9–10.3)
Chloride: 103 mmol/L (ref 98–111)
Creatinine, Ser: 0.91 mg/dL (ref 0.61–1.24)
GFR calc Af Amer: >60 mL/min (ref >60)
GFR calc non Af Amer: >60 mL/min (ref >60)
Glucose, Bld: 223 mg/dL — ABNORMAL HIGH (ref 70–99)
Potassium: 4 mmol/L (ref 3.5–5.1)
Sodium: 138 mmol/L (ref 135–145)
Total Bilirubin: 0.6 mg/dL (ref 0.3–1.2)
Total Protein: 6.7 g/dL (ref 6.5–8.1)

## 2018-10-24 LAB — CBC WITH DIFFERENTIAL/PLATELET
Abs Immature Granulocytes: 0.02 10*3/uL (ref 0.00–0.07)
Basophils Absolute: 0 10*3/uL (ref 0.0–0.1)
Basophils Relative: 0 %
Eosinophils Absolute: 0 10*3/uL (ref 0.0–0.5)
Eosinophils Relative: 0 %
HCT: 38.3 % — ABNORMAL LOW (ref 39.0–52.0)
Hemoglobin: 12.6 g/dL — ABNORMAL LOW (ref 13.0–17.0)
Immature Granulocytes: 0 %
Lymphocytes Relative: 11 %
Lymphs Abs: 0.6 10*3/uL — ABNORMAL LOW (ref 0.7–4.0)
MCH: 33.8 pg (ref 26.0–34.0)
MCHC: 32.9 g/dL (ref 30.0–36.0)
MCV: 102.7 fL — ABNORMAL HIGH (ref 80.0–100.0)
Monocytes Absolute: 0.3 10*3/uL (ref 0.1–1.0)
Monocytes Relative: 5 %
Neutro Abs: 4.9 10*3/uL (ref 1.7–7.7)
Neutrophils Relative %: 84 %
Platelets: 152 10*3/uL (ref 150–400)
RBC: 3.73 MIL/uL — ABNORMAL LOW (ref 4.22–5.81)
RDW: 12.5 % (ref 11.5–15.5)
WBC: 5.9 10*3/uL (ref 4.0–10.5)
nRBC: 0 % (ref 0.0–0.2)

## 2018-10-24 LAB — GLUCOSE, CAPILLARY: Glucose-Capillary: 149 mg/dL — ABNORMAL HIGH (ref 70–99)

## 2018-10-24 LAB — D-DIMER, QUANTITATIVE: D-Dimer, Quant: 3.51 ug/mL-FEU — ABNORMAL HIGH (ref 0.00–0.50)

## 2018-10-24 LAB — C-REACTIVE PROTEIN: CRP: 5 mg/dL — ABNORMAL HIGH (ref <1.0)

## 2018-10-24 MED ORDER — INSULIN ASPART 100 UNIT/ML ~~LOC~~ SOLN: SUBCUTANEOUS | 11 refills | Status: DC

## 2018-10-24 MED ORDER — ZINC SULFATE 220 (50 ZN) MG PO CAPS: 220.0000 mg | ORAL_CAPSULE | Freq: Every day | ORAL | Status: DC

## 2018-10-24 MED ORDER — ASCORBIC ACID 500 MG PO TABS: 500.0000 mg | ORAL_TABLET | Freq: Every day | ORAL | Status: DC

## 2018-10-24 MED ORDER — PREDNISONE 10 MG PO TABS: ORAL_TABLET | ORAL | 0 refills | Status: DC

## 2018-10-24 NOTE — Discharge Summary (Addendum)
PATIENT DETAILS Name: Chase Simmons Age: 85 y.o. Sex: male Date of Birth: 1933-01-07 MRN: [id number]. Admitting Physician: Thurnell Lose, MD [id number], Angela Adam, MD  Admit Date: 10/20/2018 Discharge date: 10/24/2018  Recommendations for Outpatient Follow-up:  1. Follow up with PCP in 1-2 weeks 2. Please obtain CMP/CBC in one week   Admitted From:  SNF  Disposition: SNF   Home Health: No  Equipment/Devices: None  Discharge Condition: Stable  CODE STATUS: DNR  Diet recommendation:  Diet Order            Diet - low sodium heart healthy        Diet Carb Modified        Diet Heart Room service appropriate? Yes; Fluid consistency: Thin  Diet effective now               Brief Summary: See H&P, Labs, Consult and Test reports for all details in brief, Patient is a 85 y.o. male with PMHx of phonic systolic heart failure, advanced dementia, essential tremor, DM-2, HTN-resident of SNF-transferred to [facility] ED on 10/15 after he was found to have Covid positive.  He was subsequently transferred to [facility] for further [facility] Course: COVID-19 infection: No SOB-on room air-numreous Chest xray have been negative for PNA so far, CRP did go up-but now downtrending. Has been maintained on steroids-will be discharged on a short steroid taper.   COVID-19 Labs:  Recent Labs    10/22/18 0340 10/23/18 0315 10/24/18 0216  DDIMER 3.57* 3.40* 3.51*  CRP 7.0* 9.6* 5.0*    Lab Results  Component Value Date   SARSCOV2NAA NEGATIVE 06/12/2018   [place] NEGATIVE 06/06/2018     COVID-19 Medications: Steroids: 10/15>> Remdesivir: Not indicated as not hypoxic-no pneumonia on imaging.   Actemra: Not indicated Convalescent Plasma: Not indicated Research Studies:N/A  Chronic systolic heart failure (EF 45-50% on 01/16/2017): Remains euvolemic-no indication for diuretics.   PAF: Rate controlled-clearly not a candidate for  anticoagulation given frailty/advanced age/advanced dementia-continue aspirin.  DM-2: oral hypoglycemic medications were held-managed with SSI-CBGs remained stable.Resume oral hypoglycemic agents on discharge  HTN: BP controlled-continue Amlodipine.      COPD: Stable-continue bronchodilators  Nonobstructive CAD (by [facility] July 2018): Asymptomatic-no anginal symptoms.  Essential tremor: Continue primidone.  GERD: Continue PPI  Dementia with delirium: Probably has advanced dementia at baseline-exhibits frailty as well.  Continue Paxil, Seroquel and trazodone.  Palliative care: MOST form present in paper chart-DNR-appears to suggest only gentle medical treatment.   Debility/deconditioning/frailty: Very frail-debilitated at baseline-appreciate SLP eval.    Procedures/Studies: None  Discharge Diagnoses:  Active Problems:   HTN (hypertension)   PAF (paroxysmal atrial fibrillation) (HCC)   COPD (chronic obstructive pulmonary disease) (HCC)   CHF (congestive heart failure) ([place])   COVID-19 virus infection   COVID-19   Discharge Instructions: Check CBG's ac and hs      Person Under Monitoring Name: Chase Simmons  Location: [address] [place] [address]   Infection Prevention Recommendations for Individuals Confirmed to have, or Being Evaluated for, 2019 Novel Coronavirus (COVID-19) Infection Who Receive Care at Home  Individuals who are confirmed to have, or are being evaluated for, COVID-19 should follow the prevention steps below until a healthcare provider or local or state health department says they can return to normal activities.  Stay home except to get medical care You should restrict activities outside your home, except for getting medical care. Do not go to work, school,  or public areas, and do not use public transportation or taxis.  Call ahead before visiting your doctor Before your medical appointment, call the healthcare provider and tell  them that you have, or are being evaluated for, COVID-19 infection. This will help the healthcare provider's office take steps to keep other people from getting infected. Ask your healthcare provider to call the local or state health department.  Monitor your symptoms Seek prompt medical attention if your illness is worsening (e.g., difficulty breathing). Before going to your medical appointment, call the healthcare provider and tell them that you have, or are being evaluated for, COVID-19 infection. Ask your healthcare provider to call the local or state health department.  Wear a facemask You should wear a facemask that covers your nose and mouth when you are in the same room with other people and when you visit a healthcare provider. People who live with or visit you should also wear a facemask while they are in the same room with you.  Separate yourself from other people in your home As much as possible, you should stay in a different room from other people in your home. Also, you should use a separate bathroom, if available.  Avoid sharing household items You should not share dishes, drinking glasses, cups, eating utensils, towels, bedding, or other items with other people in your home. After using these items, you should wash them thoroughly with soap and water.  Cover your coughs and sneezes Cover your mouth and nose with a tissue when you cough or sneeze, or you can cough or sneeze into your sleeve. Throw used tissues in a lined trash can, and immediately wash your hands with soap and water for at least 20 seconds or use an alcohol-based hand rub.  Wash your [facility] your hands often and thoroughly with soap and water for at least 20 seconds. You can use an alcohol-based hand sanitizer if soap and water are not available and if your hands are not visibly dirty. Avoid touching your eyes, nose, and mouth with unwashed hands.   Prevention Steps for Caregivers and Household  Members of Individuals Confirmed to have, or Being Evaluated for, COVID-19 Infection Being Cared for in the Home  If you live with, or provide care at home for, a person confirmed to have, or being evaluated for, COVID-19 infection please follow these guidelines to prevent infection:  Follow healthcare provider's instructions Make sure that you understand and can help the patient follow any healthcare provider instructions for all care.  Provide for the patient's basic needs You should help the patient with basic needs in the home and provide support for getting groceries, prescriptions, and other personal needs.  Monitor the patient's symptoms If they are getting sicker, call his or her medical provider and tell them that the patient has, or is being evaluated for, COVID-19 infection. This will help the healthcare provider's office take steps to keep other people from getting infected. Ask the healthcare provider to call the local or state health department.  Limit the number of people who have contact with the patient  If possible, have only one caregiver for the patient.  Other household members should stay in another home or place of residence. If this is not possible, they should stay  in another room, or be separated from the patient as much as possible. Use a separate bathroom, if available.  Restrict visitors who do not have an essential need to be in the home.  Keep older  adults, very young children, and other sick people away from the patient Keep older adults, very young children, and those who have compromised immune systems or chronic health conditions away from the patient. This includes people with chronic heart, lung, or kidney conditions, diabetes, and cancer.  Ensure good ventilation Make sure that shared spaces in the home have good air flow, such as from an air conditioner or an opened window, weather permitting.  Wash your hands often  Wash your hands often  and thoroughly with soap and water for at least 20 seconds. You can use an alcohol based hand sanitizer if soap and water are not available and if your hands are not visibly dirty.  Avoid touching your eyes, nose, and mouth with unwashed hands.  Use disposable paper towels to dry your hands. If not available, use dedicated cloth towels and replace them when they become wet.  Wear a facemask and gloves  Wear a disposable facemask at all times in the room and gloves when you touch or have contact with the patient's blood, body fluids, and/or secretions or excretions, such as sweat, saliva, sputum, nasal mucus, vomit, urine, or feces.  Ensure the mask fits over your nose and mouth tightly, and do not touch it during use.  Throw out disposable facemasks and gloves after using them. Do not reuse.  Wash your hands immediately after removing your facemask and gloves.  If your personal clothing becomes contaminated, carefully remove clothing and launder. Wash your hands after handling contaminated clothing.  Place all used disposable facemasks, gloves, and other waste in a lined container before disposing them with other household waste.  Remove gloves and wash your hands immediately after handling these items.  Do not share dishes, glasses, or other household items with the patient  Avoid sharing household items. You should not share dishes, drinking glasses, cups, eating utensils, towels, bedding, or other items with a patient who is confirmed to have, or being evaluated for, COVID-19 infection.  After the person uses these items, you should wash them thoroughly with soap and water.  Wash laundry thoroughly  Immediately remove and wash clothes or bedding that have blood, body fluids, and/or secretions or excretions, such as sweat, saliva, sputum, nasal mucus, vomit, urine, or feces, on them.  Wear gloves when handling laundry from the patient.  Read and follow directions on labels of  laundry or clothing items and detergent. In general, wash and dry with the warmest temperatures recommended on the label.  Clean all areas the individual has used often  Clean all touchable surfaces, such as counters, tabletops, doorknobs, bathroom fixtures, toilets, phones, keyboards, tablets, and bedside tables, every day. Also, clean any surfaces that may have blood, body fluids, and/or secretions or excretions on them.  Wear gloves when cleaning surfaces the patient has come in contact with.  Use a diluted bleach solution (e.g., dilute bleach with 1 part bleach and 10 parts water) or a household disinfectant with a label that says EPA-registered for coronaviruses. To make a bleach solution at home, add 1 tablespoon of bleach to 1 quart (4 cups) of water. For a larger supply, add  cup of bleach to 1 gallon (16 cups) of water.  Read labels of cleaning products and follow recommendations provided on product labels. Labels contain instructions for safe and effective use of the cleaning product including precautions you should take when applying the product, such as wearing gloves or eye protection and making sure you have good ventilation during use  of the product.  Remove gloves and wash hands immediately after cleaning.  Monitor yourself for signs and symptoms of illness Caregivers and household members are considered close contacts, should monitor their health, and will be asked to limit movement outside of the home to the extent possible. Follow the monitoring steps for close contacts listed on the symptom monitoring form.   ? If you have additional questions, contact your local health department or call the epidemiologist on call at [phone] (available 24/7). ? This guidance is subject to change. For the most up-to-date guidance from CDC, please refer to their website: YouBlogs.pl    Activity:  As tolerated with Full  fall precautions use walker/cane & assistance as needed   Discharge Instructions    (HEART FAILURE PATIENTS) Call MD:  Anytime you have any of the following symptoms: 1) 3 pound weight gain in 24 hours or 5 pounds in 1 week 2) shortness of breath, with or without a dry hacking cough 3) swelling in the hands, feet or stomach 4) if you have to sleep on extra pillows at night in order to breathe.   Complete by: As directed    Call MD for:  difficulty breathing, headache or visual disturbances   Complete by: As directed    Call MD for:  temperature >100.4   Complete by: As directed    Diet - low sodium heart healthy   Complete by: As directed    Diet Carb Modified   Complete by: As directed    Discharge instructions   Complete by: As directed    Follow with Primary MD  Leone Haven, MD in 1-2 weeks  Please get a complete blood count and chemistry panel checked by your Primary MD at your next visit, and again as instructed by your Primary MD.  Get Medicines reviewed and adjusted: Please take all your medications with you for your next visit with your Primary MD  Laboratory/radiological data: Please request your Primary MD to go over all hospital tests and procedure/radiological results at the follow up, please ask your Primary MD to get all Hospital records sent to his/her office.  In some cases, they will be blood work, cultures and biopsy results pending at the time of your discharge. Please request that your primary care M.D. follows up on these results.  Also Note the following: If you experience worsening of your admission symptoms, develop shortness of breath, life threatening emergency, suicidal or homicidal thoughts you must seek medical attention immediately by calling 911 or calling your MD immediately  if symptoms less severe.  You must read complete instructions/literature along with all the possible adverse reactions/side effects for all the Medicines you take and that  have been prescribed to you. Take any new Medicines after you have completely understood and accpet all the possible adverse reactions/side effects.   Do not drive when taking Pain medications or sleeping medications (Benzodaizepines)  Do not take more than prescribed Pain, Sleep and Anxiety Medications. It is not advisable to combine anxiety,sleep and pain medications without talking with your primary care practitioner  Special Instructions: If you have smoked or chewed Tobacco  in the last 2 yrs please stop smoking, stop any regular Alcohol  and or any Recreational drug use.  Wear Seat belts while driving.  Please note: You were cared for by a hospitalist during your hospital stay. Once you are discharged, your primary care physician will handle any further medical issues. Please note that NO REFILLS for any  discharge medications will be authorized once you are discharged, as it is imperative that you return to your primary care physician (or establish a relationship with a primary care physician if you do not have one) for your post hospital discharge needs so that they can reassess your need for medications and monitor your lab values.   Increase activity slowly   Complete by: As directed      Allergies as of 10/24/2018      Reactions   Latex Itching      Medication List    TAKE these medications   acetaminophen 325 MG tablet Commonly known as: TYLENOL Take 2 tablets (650 mg total) by mouth every 6 (six) hours as needed for mild pain (or Fever >/= 101).   albuterol (2.5 MG/3ML) 0.083% nebulizer solution Commonly known as: PROVENTIL Take 3 mLs (2.5 mg total) by nebulization every 2 (two) hours as needed for wheezing.   amLODipine 2.5 MG tablet Commonly known as: NORVASC Take 1 tablet (2.5 mg total) by mouth daily.   ascorbic acid 500 MG tablet Commonly known as: VITAMIN C Take 1 tablet (500 mg total) by mouth daily. Start taking on: October 25, 2018   aspirin EC 81 MG  tablet Take 81 mg by mouth daily.   atorvastatin 20 MG tablet Commonly known as: LIPITOR Take 1 tablet (20 mg total) by mouth daily.   gabapentin 100 MG capsule Commonly known as: NEURONTIN Take 100 mg by mouth at bedtime.   glimepiride 2 MG tablet Commonly known as: AMARYL Take 2 mg by mouth daily with breakfast.   insulin aspart 100 UNIT/ML injection Commonly known as: novoLOG 0-9 Units, Subcutaneous, 3 times daily with mealS CBG < 70: Implement Hypoglycemia  CBG 70 - 120: 0 units CBG 121 - 150: 1 unit CBG 151 - 200: 2 units CBG 201 - 250: 3 units CBG 251 - 300: 5 units CBG 301 - 350: 7 units CBG 351 - 400: 9 units CBG > 400: call MD   lisinopril 20 MG tablet Commonly known as: ZESTRIL Take 1 tablet (20 mg total) by mouth daily.   metFORMIN 500 MG tablet Commonly known as: GLUCOPHAGE TAKE 1 TABLET BY MOUTH TWICE A DAY   pantoprazole 40 MG tablet Commonly known as: PROTONIX Take 1 tablet (40 mg total) by mouth daily.   PARoxetine 30 MG tablet Commonly known as: PAXIL Take 30 mg by mouth daily.   polyethylene glycol 17 g packet Commonly known as: MIRALAX / GLYCOLAX Take 17 g by mouth daily as needed for mild constipation.   predniSONE 10 MG tablet Commonly known as: DELTASONE Take 40 mg daily for 1 day, 30 mg daily for 1 day, 20 mg daily for 1 days,10 mg daily for 1 day, then stop   primidone 50 MG tablet Commonly known as: MYSOLINE Take 100 mg by mouth 3 (three) times daily.   QUEtiapine 50 MG tablet Commonly known as: SEROQUEL Take 50-100 mg by mouth 3 (three) times daily.   tiotropium 18 MCG inhalation capsule Commonly known as: SPIRIVA Place 18 mcg as needed into inhaler and inhale.   traZODone 100 MG tablet Commonly known as: DESYREL Take 100 mg by mouth at bedtime.   zinc sulfate 220 (50 Zn) MG capsule Take 1 capsule (220 mg total) by mouth daily. Start taking on: October 25, 2018      Follow-up Information    Leone Haven, MD.  Schedule an appointment as soon as possible for a visit  in 1 week(s).   Specialty: Family Medicine Contact information: [place] [address] [phone]        Wellington Hampshire, MD Follow up in 1 month(s).   Specialty: Cardiology Contact information: [address] [place] [address] [phone]          Allergies  Allergen Reactions  . Latex Itching    Consultations:   None  Other Procedures/Studies: Dg Chest Port 1 View  Result Date: 10/20/2018 CLINICAL DATA:  Shortness of breath.  COVID-19 virus infection. EXAM: PORTABLE CHEST 1 VIEW COMPARISON:  10/18/2018 FINDINGS: The heart size and mediastinal contours are within normal limits. Aortic atherosclerosis. Stable prominence of pulmonary interstitial markings. No evidence of pulmonary airspace disease or pleural effusion. The visualized skeletal structures are unremarkable. IMPRESSION: Stable exam.  No active disease. Electronically Signed   By: Marlaine Hind M.D.   On: 10/20/2018 12:12   Dg Chest Portable 1 View  Result Date: 10/18/2018 CLINICAL DATA:  COVID-19 positive. Diabetes. Hypertension. COPD. Bladder cancer. EXAM: PORTABLE CHEST 1 VIEW COMPARISON:  06/06/2018 FINDINGS: Two frontal views of the chest. Midline trachea. Borderline cardiomegaly. Atherosclerosis in the transverse aorta. No pleural effusion or pneumothorax. Moderate pulmonary interstitial thickening is not significantly changed. Bibasilar opacities, favored to represent scarring or subsegmental atelectasis, similar. IMPRESSION: Chronic interstitial thickening, likely related to the clinical history of COPD. No acute superimposed process. Aortic Atherosclerosis (ICD10-I70.0). Electronically Signed   By: Abigail Miyamoto M.D.   On: 10/18/2018 18:33   Dg Chest Port 1v Same Day  Result Date: 10/23/2018 CLINICAL DATA:  Shortness of breath. History of diabetes, COPD and bladder cancer. EXAM: PORTABLE CHEST 1 VIEW  COMPARISON:  Radiographs 10/20/2018 and 10/18/2018. CT 06/06/2018. FINDINGS: 1148 hours. The heart size and mediastinal contours are stable. There is aortic atherosclerosis. There is stable mild interstitial and central airway thickening which appears chronic. No superimposed airspace disease, pleural effusion or pneumothorax. And old rib fractures noted on the right. IMPRESSION: 1. Stable chronic lung disease. No acute cardiopulmonary process. 2. Aortic atherosclerosis. Electronically Signed   By: Richardean Sale M.D.   On: 10/23/2018 12:13     TODAY-DAY OF DISCHARGE:  Subjective:   Chase Simmons today has no headache,no chest abdominal pain,no new weakness tingling or numbness, feels much better wants to go home today.   Objective:   Blood pressure (!) 142/85, pulse 71, temperature 97.7 F (36.5 C), temperature source Axillary, resp. rate 15, height 5\' 9"  (1.753 m), weight 85.6 kg, SpO2 100 %.  Intake/Output Summary (Last 24 hours) at 10/24/2018 0905 Last data filed at 10/24/2018 0829 Gross per 24 hour  Intake 603 ml  Output 500 ml  Net 103 ml   Filed Weights   10/20/18 0617  Weight: 85.6 kg    Exam: Awake Alert, Oriented *3, No new F.N deficits, Normal affect [place].AT,PERRAL Supple Neck,No JVD, No cervical lymphadenopathy appriciated.  Symmetrical Chest wall movement, Good air movement bilaterally, CTAB RRR,No Gallops,Rubs or new Murmurs, No Parasternal Heave +ve B.Sounds, Abd Soft, Non tender, No organomegaly appriciated, No rebound -guarding or rigidity. No Cyanosis, Clubbing or edema, No new Rash or bruise   PERTINENT RADIOLOGIC STUDIES: Dg Chest Port 1 View  Result Date: 10/20/2018 CLINICAL DATA:  Shortness of breath.  COVID-19 virus infection. EXAM: PORTABLE CHEST 1 VIEW COMPARISON:  10/18/2018 FINDINGS: The heart size and mediastinal contours are within normal limits. Aortic atherosclerosis. Stable prominence of pulmonary interstitial markings. No evidence of pulmonary  airspace  disease or pleural effusion. The visualized skeletal structures are unremarkable. IMPRESSION: Stable exam.  No active disease. Electronically Signed   By: Marlaine Hind M.D.   On: 10/20/2018 12:12   Dg Chest Portable 1 View  Result Date: 10/18/2018 CLINICAL DATA:  COVID-19 positive. Diabetes. Hypertension. COPD. Bladder cancer. EXAM: PORTABLE CHEST 1 VIEW COMPARISON:  06/06/2018 FINDINGS: Two frontal views of the chest. Midline trachea. Borderline cardiomegaly. Atherosclerosis in the transverse aorta. No pleural effusion or pneumothorax. Moderate pulmonary interstitial thickening is not significantly changed. Bibasilar opacities, favored to represent scarring or subsegmental atelectasis, similar. IMPRESSION: Chronic interstitial thickening, likely related to the clinical history of COPD. No acute superimposed process. Aortic Atherosclerosis (ICD10-I70.0). Electronically Signed   By: Abigail Miyamoto M.D.   On: 10/18/2018 18:33   Dg Chest Port 1v Same Day  Result Date: 10/23/2018 CLINICAL DATA:  Shortness of breath. History of diabetes, COPD and bladder cancer. EXAM: PORTABLE CHEST 1 VIEW COMPARISON:  Radiographs 10/20/2018 and 10/18/2018. CT 06/06/2018. FINDINGS: 1148 hours. The heart size and mediastinal contours are stable. There is aortic atherosclerosis. There is stable mild interstitial and central airway thickening which appears chronic. No superimposed airspace disease, pleural effusion or pneumothorax. And old rib fractures noted on the right. IMPRESSION: 1. Stable chronic lung disease. No acute cardiopulmonary process. 2. Aortic atherosclerosis. Electronically Signed   By: Richardean Sale M.D.   On: 10/23/2018 12:13     PERTINENT LAB RESULTS: CBC: Recent Labs    10/23/18 0315 10/24/18 0216  WBC 4.4 5.9  HGB 11.9* 12.6*  HCT 35.8* 38.3*  PLT 125* 152   CMET CMP     Component Value Date/Time   NA 138 10/24/2018 0216   NA 137 01/08/2014 1611   K 4.0 10/24/2018 0216   K 4.4  01/08/2014 1611   CL 103 10/24/2018 0216   CL 102 01/08/2014 1611   CO2 25 10/24/2018 0216   CO2 26 01/08/2014 1611   GLUCOSE 223 (H) 10/24/2018 0216   GLUCOSE 172 (H) 01/08/2014 1611   BUN 31 (H) 10/24/2018 0216   BUN 18 01/08/2014 1611   CREATININE 0.91 10/24/2018 0216   CREATININE 1.13 01/08/2014 1611   CALCIUM 8.6 (L) 10/24/2018 0216   CALCIUM 8.4 (L) 01/08/2014 1611   PROT 6.7 10/24/2018 0216   ALBUMIN 2.9 (L) 10/24/2018 0216   AST 22 10/24/2018 0216   ALT 21 10/24/2018 0216   ALKPHOS 60 10/24/2018 0216   BILITOT 0.6 10/24/2018 0216   GFRNONAA >60 10/24/2018 0216   GFRNONAA >60 01/08/2014 1611   GFRNONAA >60 12/15/2011 1257   GFRAA >60 10/24/2018 0216   GFRAA >60 01/08/2014 1611   GFRAA >60 12/15/2011 1257    GFR Estimated Creatinine Clearance: 66.7 mL/min (by C-G formula based on SCr of 0.91 mg/dL). No results for input(s): LIPASE, AMYLASE in the last 72 hours. No results for input(s): CKTOTAL, CKMB, CKMBINDEX, TROPONINI in the last 72 hours. Invalid input(s): POCBNP Recent Labs    10/23/18 0315 10/24/18 0216  DDIMER 3.40* 3.51*   No results for input(s): HGBA1C in the last 72 hours. No results for input(s): CHOL, HDL, LDLCALC, TRIG, CHOLHDL, LDLDIRECT in the last 72 hours. No results for input(s): TSH, T4TOTAL, T3FREE, THYROIDAB in the last 72 hours.  Invalid input(s): FREET3 No results for input(s): VITAMINB12, FOLATE, FERRITIN, TIBC, IRON, RETICCTPCT in the last 72 hours. Coags: No results for input(s): INR in the last 72 hours.  Invalid input(s): PT Microbiology: Recent Results (from the past 240 hour(s))  MRSA PCR Screening     Status: None   Collection Time: 10/20/18  6:55 AM   Specimen: Nasal Mucosa; Nasopharyngeal  Result Value Ref Range Status   MRSA by PCR NEGATIVE NEGATIVE Final    Comment:        The GeneXpert MRSA Assay (FDA approved for NASAL specimens only), is one component of a comprehensive MRSA colonization surveillance program. It  is not intended to diagnose MRSA infection nor to guide or monitor treatment for MRSA infections. Performed at [facility], [place] [address], [place],  [address]     FURTHER DISCHARGE INSTRUCTIONS:  Get Medicines reviewed and adjusted: Please take all your medications with you for your next visit with your Primary MD  Laboratory/radiological data: Please request your Primary MD to go over all hospital tests and procedure/radiological results at the follow up, please ask your Primary MD to get all Hospital records sent to his/her office.  In some cases, they will be blood work, cultures and biopsy results pending at the time of your discharge. Please request that your primary care M.D. goes through all the records of your hospital data and follows up on these results.  Also Note the following: If you experience worsening of your admission symptoms, develop shortness of breath, life threatening emergency, suicidal or homicidal thoughts you must seek medical attention immediately by calling 911 or calling your MD immediately  if symptoms less severe.  You must read complete instructions/literature along with all the possible adverse reactions/side effects for all the Medicines you take and that have been prescribed to you. Take any new Medicines after you have completely understood and accpet all the possible adverse reactions/side effects.   Do not drive when taking Pain medications or sleeping medications (Benzodaizepines)  Do not take more than prescribed Pain, Sleep and Anxiety Medications. It is not advisable to combine anxiety,sleep and pain medications without talking with your primary care practitioner  Special Instructions: If you have smoked or chewed Tobacco  in the last 2 yrs please stop smoking, stop any regular Alcohol  and or any Recreational drug use.  Wear Seat belts while driving.  Please note: You were cared for by a hospitalist during your  hospital stay. Once you are discharged, your primary care physician will handle any further medical issues. Please note that NO REFILLS for any discharge medications will be authorized once you are discharged, as it is imperative that you return to your primary care physician (or establish a relationship with a primary care physician if you do not have one) for your post hospital discharge needs so that they can reassess your need for medications and monitor your lab values.  Total Time spent coordinating discharge including counseling, education and face to face time equals 35 minutes.  Signed: Shanker Ghimire 10/24/2018 9:05 AM

## 2018-10-24 NOTE — TOC Transition Note (Signed)
Transition of Care [facility]) - CM/SW Discharge Note   Patient Details  Name: Chase Simmons MRN: [id number] Date of Birth: 10-Apr-1933  Transition of Care [facility]) CM/SW Contact:  Weston Anna, LCSW Phone Number: 10/24/2018, 9:31 AM   Clinical Narrative:     Patient set to discharge back to [facility] today- please call report to [phone]. Daughter, [name], notified and no concerns. [facility] called for transportation and should arrive shortly/   Final next level of care: [place] Barriers to Discharge: Continued Medical Work up   Patient Goals and CMS Choice Patient states their goals for this hospitalization and ongoing recovery are:: getting back home- per daughter CMS Medicare.gov Compare Post Acute Care list provided to:: Patient Represenative (must comment)(not oriented- spoke with daughter)    Discharge Placement              Patient chooses bed at: [facility] [place] Patient to be transferred to facility by: [place] Name of family member notified: [name]-daughter Patient and family notified of of transfer: 10/24/18  Discharge Plan and Services In-house Referral: Clinical Social Work              DME Arranged: N/A         HH Arranged: NA          Social Determinants of Health ([place]) Interventions     Readmission Risk Interventions Readmission Risk Prevention Plan 06/07/2018  Transportation Screening Complete  PCP or Specialist Appt within 3-5 Days Complete  HRI or Home Care Consult Complete  Social Work Consult for [place] Planning/Counseling Not Complete  Medication Review Press photographer) Complete  Some recent data might be hidden

## 2018-10-24 NOTE — Progress Notes (Signed)
Report called to [facility]

## 2018-10-28 DIAGNOSIS — R69 Illness, unspecified: Secondary | ICD-10-CM | POA: Diagnosis not present

## 2018-10-28 DIAGNOSIS — Z9981 Dependence on supplemental oxygen: Secondary | ICD-10-CM | POA: Diagnosis not present

## 2018-10-28 DIAGNOSIS — E785 Hyperlipidemia, unspecified: Secondary | ICD-10-CM | POA: Diagnosis not present

## 2018-10-28 DIAGNOSIS — U071 COVID-19: Secondary | ICD-10-CM | POA: Diagnosis not present

## 2018-10-28 DIAGNOSIS — R251 Tremor, unspecified: Secondary | ICD-10-CM | POA: Diagnosis not present

## 2018-10-28 DIAGNOSIS — E119 Type 2 diabetes mellitus without complications: Secondary | ICD-10-CM | POA: Diagnosis not present

## 2018-10-28 DIAGNOSIS — R05 Cough: Secondary | ICD-10-CM | POA: Diagnosis not present

## 2018-10-28 DIAGNOSIS — R0602 Shortness of breath: Secondary | ICD-10-CM | POA: Diagnosis not present

## 2018-10-28 DIAGNOSIS — J449 Chronic obstructive pulmonary disease, unspecified: Secondary | ICD-10-CM | POA: Diagnosis not present

## 2018-10-29 DIAGNOSIS — D649 Anemia, unspecified: Secondary | ICD-10-CM | POA: Diagnosis not present

## 2018-10-29 DIAGNOSIS — R6889 Other general symptoms and signs: Secondary | ICD-10-CM | POA: Diagnosis not present

## 2018-11-13 ENCOUNTER — Encounter
Admission: EM | Disposition: A | Discharge status: Hospice | Payer: Self-pay | Source: Home / Self Care | Attending: Internal Medicine

## 2018-11-13 ENCOUNTER — Emergency Department: Payer: Medicare HMO

## 2018-11-13 ENCOUNTER — Inpatient Hospital Stay
Admission: EM | Admit: 2018-11-13 | Discharge: 2018-12-04 | DRG: 163 | Disposition: A | Discharge status: Hospice | Payer: Medicare HMO | Attending: Internal Medicine | Admitting: Internal Medicine

## 2018-11-13 ENCOUNTER — Other Ambulatory Visit: Payer: Self-pay

## 2018-11-13 DIAGNOSIS — I10 Essential (primary) hypertension: Secondary | ICD-10-CM | POA: Diagnosis not present

## 2018-11-13 DIAGNOSIS — Z87891 Personal history of nicotine dependence: Secondary | ICD-10-CM | POA: Diagnosis not present

## 2018-11-13 DIAGNOSIS — R627 Adult failure to thrive: Secondary | ICD-10-CM | POA: Diagnosis present

## 2018-11-13 DIAGNOSIS — J9601 Acute respiratory failure with hypoxia: Secondary | ICD-10-CM

## 2018-11-13 DIAGNOSIS — R52 Pain, unspecified: Secondary | ICD-10-CM | POA: Diagnosis not present

## 2018-11-13 DIAGNOSIS — R579 Shock, unspecified: Secondary | ICD-10-CM | POA: Diagnosis present

## 2018-11-13 DIAGNOSIS — I110 Hypertensive heart disease with heart failure: Secondary | ICD-10-CM | POA: Diagnosis present

## 2018-11-13 DIAGNOSIS — Z66 Do not resuscitate: Secondary | ICD-10-CM | POA: Diagnosis present

## 2018-11-13 DIAGNOSIS — R0602 Shortness of breath: Secondary | ICD-10-CM | POA: Diagnosis not present

## 2018-11-13 DIAGNOSIS — I480 Paroxysmal atrial fibrillation: Secondary | ICD-10-CM | POA: Diagnosis not present

## 2018-11-13 DIAGNOSIS — Z79899 Other long term (current) drug therapy: Secondary | ICD-10-CM

## 2018-11-13 DIAGNOSIS — G4700 Insomnia, unspecified: Secondary | ICD-10-CM | POA: Diagnosis not present

## 2018-11-13 DIAGNOSIS — R410 Disorientation, unspecified: Secondary | ICD-10-CM | POA: Diagnosis not present

## 2018-11-13 DIAGNOSIS — G20 Parkinson's disease: Secondary | ICD-10-CM | POA: Diagnosis present

## 2018-11-13 DIAGNOSIS — R1312 Dysphagia, oropharyngeal phase: Secondary | ICD-10-CM | POA: Diagnosis not present

## 2018-11-13 DIAGNOSIS — E872 Acidosis: Secondary | ICD-10-CM | POA: Diagnosis present

## 2018-11-13 DIAGNOSIS — D62 Acute posthemorrhagic anemia: Secondary | ICD-10-CM | POA: Diagnosis present

## 2018-11-13 DIAGNOSIS — Z7982 Long term (current) use of aspirin: Secondary | ICD-10-CM

## 2018-11-13 DIAGNOSIS — G9340 Encephalopathy, unspecified: Secondary | ICD-10-CM | POA: Diagnosis not present

## 2018-11-13 DIAGNOSIS — J449 Chronic obstructive pulmonary disease, unspecified: Secondary | ICD-10-CM | POA: Diagnosis present

## 2018-11-13 DIAGNOSIS — E785 Hyperlipidemia, unspecified: Secondary | ICD-10-CM | POA: Diagnosis present

## 2018-11-13 DIAGNOSIS — Z8619 Personal history of other infectious and parasitic diseases: Secondary | ICD-10-CM

## 2018-11-13 DIAGNOSIS — M62461 Contracture of muscle, right lower leg: Secondary | ICD-10-CM | POA: Diagnosis not present

## 2018-11-13 DIAGNOSIS — I2602 Saddle embolus of pulmonary artery with acute cor pulmonale: Secondary | ICD-10-CM | POA: Diagnosis not present

## 2018-11-13 DIAGNOSIS — R451 Restlessness and agitation: Secondary | ICD-10-CM | POA: Diagnosis not present

## 2018-11-13 DIAGNOSIS — R69 Illness, unspecified: Secondary | ICD-10-CM | POA: Diagnosis not present

## 2018-11-13 DIAGNOSIS — D696 Thrombocytopenia, unspecified: Secondary | ICD-10-CM | POA: Diagnosis not present

## 2018-11-13 DIAGNOSIS — I5022 Chronic systolic (congestive) heart failure: Secondary | ICD-10-CM | POA: Diagnosis not present

## 2018-11-13 DIAGNOSIS — I2510 Atherosclerotic heart disease of native coronary artery without angina pectoris: Secondary | ICD-10-CM | POA: Diagnosis present

## 2018-11-13 DIAGNOSIS — R531 Weakness: Secondary | ICD-10-CM | POA: Diagnosis not present

## 2018-11-13 DIAGNOSIS — I248 Other forms of acute ischemic heart disease: Secondary | ICD-10-CM | POA: Diagnosis not present

## 2018-11-13 DIAGNOSIS — M2550 Pain in unspecified joint: Secondary | ICD-10-CM | POA: Diagnosis not present

## 2018-11-13 DIAGNOSIS — I959 Hypotension, unspecified: Secondary | ICD-10-CM | POA: Diagnosis not present

## 2018-11-13 DIAGNOSIS — L8990 Pressure ulcer of unspecified site, unspecified stage: Secondary | ICD-10-CM | POA: Diagnosis present

## 2018-11-13 DIAGNOSIS — Z8551 Personal history of malignant neoplasm of bladder: Secondary | ICD-10-CM

## 2018-11-13 DIAGNOSIS — K219 Gastro-esophageal reflux disease without esophagitis: Secondary | ICD-10-CM | POA: Diagnosis present

## 2018-11-13 DIAGNOSIS — I4891 Unspecified atrial fibrillation: Secondary | ICD-10-CM | POA: Diagnosis not present

## 2018-11-13 DIAGNOSIS — U071 COVID-19: Secondary | ICD-10-CM

## 2018-11-13 DIAGNOSIS — R000 Tachycardia, unspecified: Secondary | ICD-10-CM | POA: Diagnosis not present

## 2018-11-13 DIAGNOSIS — R404 Transient alteration of awareness: Secondary | ICD-10-CM | POA: Diagnosis not present

## 2018-11-13 DIAGNOSIS — I428 Other cardiomyopathies: Secondary | ICD-10-CM | POA: Diagnosis present

## 2018-11-13 DIAGNOSIS — Z7189 Other specified counseling: Secondary | ICD-10-CM | POA: Diagnosis not present

## 2018-11-13 DIAGNOSIS — E861 Hypovolemia: Secondary | ICD-10-CM | POA: Diagnosis present

## 2018-11-13 DIAGNOSIS — Z8679 Personal history of other diseases of the circulatory system: Secondary | ICD-10-CM | POA: Diagnosis not present

## 2018-11-13 DIAGNOSIS — R4182 Altered mental status, unspecified: Secondary | ICD-10-CM

## 2018-11-13 DIAGNOSIS — I2699 Other pulmonary embolism without acute cor pulmonale: Secondary | ICD-10-CM | POA: Diagnosis not present

## 2018-11-13 DIAGNOSIS — Z993 Dependence on wheelchair: Secondary | ICD-10-CM | POA: Diagnosis not present

## 2018-11-13 DIAGNOSIS — N179 Acute kidney failure, unspecified: Secondary | ICD-10-CM | POA: Diagnosis present

## 2018-11-13 DIAGNOSIS — E1165 Type 2 diabetes mellitus with hyperglycemia: Secondary | ICD-10-CM | POA: Diagnosis not present

## 2018-11-13 DIAGNOSIS — M62462 Contracture of muscle, left lower leg: Secondary | ICD-10-CM | POA: Diagnosis not present

## 2018-11-13 DIAGNOSIS — E11649 Type 2 diabetes mellitus with hypoglycemia without coma: Secondary | ICD-10-CM | POA: Diagnosis not present

## 2018-11-13 DIAGNOSIS — I441 Atrioventricular block, second degree: Secondary | ICD-10-CM | POA: Diagnosis not present

## 2018-11-13 DIAGNOSIS — E870 Hyperosmolality and hypernatremia: Secondary | ICD-10-CM | POA: Diagnosis not present

## 2018-11-13 DIAGNOSIS — Z515 Encounter for palliative care: Secondary | ICD-10-CM | POA: Diagnosis not present

## 2018-11-13 DIAGNOSIS — F419 Anxiety disorder, unspecified: Secondary | ICD-10-CM | POA: Diagnosis present

## 2018-11-13 DIAGNOSIS — F03918 Unspecified dementia, unspecified severity, with other behavioral disturbance: Secondary | ICD-10-CM | POA: Diagnosis present

## 2018-11-13 DIAGNOSIS — R0902 Hypoxemia: Secondary | ICD-10-CM | POA: Diagnosis not present

## 2018-11-13 DIAGNOSIS — N170 Acute kidney failure with tubular necrosis: Secondary | ICD-10-CM | POA: Diagnosis not present

## 2018-11-13 DIAGNOSIS — E876 Hypokalemia: Secondary | ICD-10-CM | POA: Diagnosis not present

## 2018-11-13 DIAGNOSIS — Z7401 Bed confinement status: Secondary | ICD-10-CM | POA: Diagnosis not present

## 2018-11-13 DIAGNOSIS — Z794 Long term (current) use of insulin: Secondary | ICD-10-CM

## 2018-11-13 DIAGNOSIS — E1142 Type 2 diabetes mellitus with diabetic polyneuropathy: Secondary | ICD-10-CM | POA: Diagnosis present

## 2018-11-13 DIAGNOSIS — E860 Dehydration: Secondary | ICD-10-CM | POA: Diagnosis present

## 2018-11-13 DIAGNOSIS — R251 Tremor, unspecified: Secondary | ICD-10-CM | POA: Diagnosis not present

## 2018-11-13 DIAGNOSIS — G250 Essential tremor: Secondary | ICD-10-CM | POA: Diagnosis not present

## 2018-11-13 DIAGNOSIS — E118 Type 2 diabetes mellitus with unspecified complications: Secondary | ICD-10-CM | POA: Diagnosis present

## 2018-11-13 DIAGNOSIS — F0281 Dementia in other diseases classified elsewhere with behavioral disturbance: Secondary | ICD-10-CM | POA: Diagnosis present

## 2018-11-13 DIAGNOSIS — I5042 Chronic combined systolic (congestive) and diastolic (congestive) heart failure: Secondary | ICD-10-CM | POA: Diagnosis present

## 2018-11-13 DIAGNOSIS — F0391 Unspecified dementia with behavioral disturbance: Secondary | ICD-10-CM | POA: Diagnosis not present

## 2018-11-13 HISTORY — PX: PULMONARY THROMBECTOMY: CATH118295

## 2018-11-13 LAB — URINALYSIS, COMPLETE (UACMP) WITH MICROSCOPIC
Bacteria, UA: NONE SEEN
Bilirubin Urine: NEGATIVE
Glucose, UA: NEGATIVE mg/dL
Hgb urine dipstick: NEGATIVE
Ketones, ur: NEGATIVE mg/dL
Leukocytes,Ua: NEGATIVE
Nitrite: NEGATIVE
Protein, ur: 30 mg/dL — AB
Specific Gravity, Urine: 1.03 (ref 1.005–1.030)
pH: 5 (ref 5.0–8.0)

## 2018-11-13 LAB — TROPONIN I (HIGH SENSITIVITY): Troponin I (High Sensitivity): 46 ng/L — ABNORMAL HIGH (ref <18)

## 2018-11-13 LAB — PROCALCITONIN: Procalcitonin: 0.23 ng/mL

## 2018-11-13 LAB — CBC WITH DIFFERENTIAL/PLATELET
Abs Immature Granulocytes: 0.03 10*3/uL (ref 0.00–0.07)
Basophils Absolute: 0 10*3/uL (ref 0.0–0.1)
Basophils Relative: 0 %
Eosinophils Absolute: 0.2 10*3/uL (ref 0.0–0.5)
Eosinophils Relative: 2 %
HCT: 36.7 % — ABNORMAL LOW (ref 39.0–52.0)
Hemoglobin: 11.7 g/dL — ABNORMAL LOW (ref 13.0–17.0)
Immature Granulocytes: 0 %
Lymphocytes Relative: 14 %
Lymphs Abs: 1.3 10*3/uL (ref 0.7–4.0)
MCH: 34.7 pg — ABNORMAL HIGH (ref 26.0–34.0)
MCHC: 31.9 g/dL (ref 30.0–36.0)
MCV: 108.9 fL — ABNORMAL HIGH (ref 80.0–100.0)
Monocytes Absolute: 0.8 10*3/uL (ref 0.1–1.0)
Monocytes Relative: 8 %
Neutro Abs: 6.7 10*3/uL (ref 1.7–7.7)
Neutrophils Relative %: 76 %
Platelets: 213 10*3/uL (ref 150–400)
RBC: 3.37 MIL/uL — ABNORMAL LOW (ref 4.22–5.81)
RDW: 15.1 % (ref 11.5–15.5)
WBC: 9.1 10*3/uL (ref 4.0–10.5)
nRBC: 0 % (ref 0.0–0.2)

## 2018-11-13 LAB — COMPREHENSIVE METABOLIC PANEL
ALT: 16 U/L (ref 0–44)
AST: 22 U/L (ref 15–41)
Albumin: 3.1 g/dL — ABNORMAL LOW (ref 3.5–5.0)
Alkaline Phosphatase: 85 U/L (ref 38–126)
Anion gap: 13 (ref 5–15)
BUN: 61 mg/dL — ABNORMAL HIGH (ref 8–23)
CO2: 22 mmol/L (ref 22–32)
Calcium: 8.6 mg/dL — ABNORMAL LOW (ref 8.9–10.3)
Chloride: 121 mmol/L — ABNORMAL HIGH (ref 98–111)
Creatinine, Ser: 1.95 mg/dL — ABNORMAL HIGH (ref 0.61–1.24)
GFR calc Af Amer: 36 mL/min — ABNORMAL LOW (ref >60)
GFR calc non Af Amer: 31 mL/min — ABNORMAL LOW (ref >60)
Glucose, Bld: 134 mg/dL — ABNORMAL HIGH (ref 70–99)
Potassium: 3.6 mmol/L (ref 3.5–5.1)
Sodium: 156 mmol/L — ABNORMAL HIGH (ref 135–145)
Total Bilirubin: 0.8 mg/dL (ref 0.3–1.2)
Total Protein: 7.1 g/dL (ref 6.5–8.1)

## 2018-11-13 LAB — PROTIME-INR
INR: 1.2 (ref 0.8–1.2)
Prothrombin Time: 15.1 seconds (ref 11.4–15.2)

## 2018-11-13 LAB — LACTATE DEHYDROGENASE: LDH: 206 U/L — ABNORMAL HIGH (ref 98–192)

## 2018-11-13 LAB — FIBRIN DERIVATIVES D-DIMER (ARMC ONLY): Fibrin derivatives D-dimer (ARMC): 4472.19 ng/mL (FEU) — ABNORMAL HIGH (ref 0.00–499.00)

## 2018-11-13 LAB — TRIGLYCERIDES: Triglycerides: 125 mg/dL (ref <150)

## 2018-11-13 LAB — FIBRINOGEN: Fibrinogen: 487 mg/dL — ABNORMAL HIGH (ref 210–475)

## 2018-11-13 LAB — APTT: aPTT: 32 seconds (ref 24–36)

## 2018-11-13 LAB — LACTIC ACID, PLASMA
Lactic Acid, Venous: 2.3 mmol/L (ref 0.5–1.9)
Lactic Acid, Venous: 2.6 mmol/L (ref 0.5–1.9)

## 2018-11-13 LAB — FERRITIN: Ferritin: 287 ng/mL (ref 24–336)

## 2018-11-13 LAB — C-REACTIVE PROTEIN: CRP: 7.4 mg/dL — ABNORMAL HIGH (ref <1.0)

## 2018-11-13 SURGERY — PULMONARY THROMBECTOMY
Anesthesia: Moderate Sedation | Laterality: Bilateral

## 2018-11-13 MED ORDER — POLYETHYLENE GLYCOL 3350 17 G PO PACK
17.0000 g | PACK | Freq: Every day | ORAL | Status: DC | PRN
  Filled 2018-11-13: qty 1

## 2018-11-13 MED ORDER — SODIUM CHLORIDE 0.9 % IV BOLUS
1000.0000 mL | Freq: Once | INTRAVENOUS | Status: AC
  Administered 2018-11-13: 1000 mL via INTRAVENOUS

## 2018-11-13 MED ORDER — FENTANYL CITRATE (PF) 100 MCG/2ML IJ SOLN
INTRAMUSCULAR | Status: AC
  Filled 2018-11-13: qty 2

## 2018-11-13 MED ORDER — PRIMIDONE 50 MG PO TABS
100.0000 mg | ORAL_TABLET | Freq: Three times a day (TID) | ORAL | Status: DC
  Administered 2018-11-20 – 2018-11-30 (×22): 100 mg via ORAL
  Filled 2018-11-13 (×51): qty 2

## 2018-11-13 MED ORDER — LACTATED RINGERS IV BOLUS
250.0000 mL | Freq: Once | INTRAVENOUS | Status: AC
  Administered 2018-11-13: 250 mL via INTRAVENOUS

## 2018-11-13 MED ORDER — QUETIAPINE FUMARATE 25 MG PO TABS
100.0000 mg | ORAL_TABLET | Freq: Every day | ORAL | Status: DC
  Administered 2018-11-21 – 2018-11-30 (×6): 100 mg via ORAL
  Filled 2018-11-13 (×9): qty 4

## 2018-11-13 MED ORDER — LACTATED RINGERS IV BOLUS
1000.0000 mL | Freq: Once | INTRAVENOUS | Status: AC
  Administered 2018-11-13: 1000 mL via INTRAVENOUS

## 2018-11-13 MED ORDER — LORAZEPAM 2 MG/ML IJ SOLN
INTRAMUSCULAR | Status: AC
  Administered 2018-11-13: 17:00:00 1 mg via INTRAVENOUS
  Filled 2018-11-13: qty 1

## 2018-11-13 MED ORDER — INSULIN ASPART 100 UNIT/ML ~~LOC~~ SOLN
0.0000 [IU] | SUBCUTANEOUS | Status: DC
  Administered 2018-11-14: 08:00:00 1 [IU] via SUBCUTANEOUS
  Administered 2018-11-14: 2 [IU] via SUBCUTANEOUS
  Administered 2018-11-15 – 2018-11-19 (×14): 1 [IU] via SUBCUTANEOUS
  Administered 2018-11-19 (×2): 2 [IU] via SUBCUTANEOUS
  Administered 2018-11-20: 1 [IU] via SUBCUTANEOUS
  Administered 2018-11-20: 2 [IU] via SUBCUTANEOUS
  Administered 2018-11-20: 1 [IU] via SUBCUTANEOUS
  Administered 2018-11-21: 2 [IU] via SUBCUTANEOUS
  Administered 2018-11-21: 1 [IU] via SUBCUTANEOUS
  Administered 2018-11-21: 3 [IU] via SUBCUTANEOUS
  Administered 2018-11-21 – 2018-11-22 (×3): 1 [IU] via SUBCUTANEOUS
  Administered 2018-11-22: 5 [IU] via SUBCUTANEOUS
  Administered 2018-11-22: 2 [IU] via SUBCUTANEOUS
  Administered 2018-11-23 (×2): 1 [IU] via SUBCUTANEOUS
  Administered 2018-11-23 (×2): 2 [IU] via SUBCUTANEOUS
  Administered 2018-11-23 – 2018-11-24 (×5): 1 [IU] via SUBCUTANEOUS
  Administered 2018-11-24 (×2): 2 [IU] via SUBCUTANEOUS
  Administered 2018-11-24 – 2018-11-25 (×2): 1 [IU] via SUBCUTANEOUS
  Administered 2018-11-25 (×4): 2 [IU] via SUBCUTANEOUS
  Administered 2018-11-26: 05:00:00 1 [IU] via SUBCUTANEOUS
  Administered 2018-11-26: 2 [IU] via SUBCUTANEOUS
  Administered 2018-11-26 (×2): 1 [IU] via SUBCUTANEOUS
  Administered 2018-11-26 – 2018-11-27 (×2): 2 [IU] via SUBCUTANEOUS
  Administered 2018-11-27: 3 [IU] via SUBCUTANEOUS
  Administered 2018-11-27 (×3): 1 [IU] via SUBCUTANEOUS
  Administered 2018-11-28: 2 [IU] via SUBCUTANEOUS
  Administered 2018-11-28: 3 [IU] via SUBCUTANEOUS
  Administered 2018-11-28 – 2018-11-29 (×2): 1 [IU] via SUBCUTANEOUS
  Filled 2018-11-13 (×62): qty 1

## 2018-11-13 MED ORDER — FENTANYL CITRATE (PF) 100 MCG/2ML IJ SOLN
INTRAMUSCULAR | Status: DC | PRN
  Administered 2018-11-13 (×2): 25 ug via INTRAVENOUS

## 2018-11-13 MED ORDER — IODIXANOL 320 MG/ML IV SOLN
INTRAVENOUS | Status: DC | PRN
  Administered 2018-11-13: 50 mL via INTRAVENOUS

## 2018-11-13 MED ORDER — PANTOPRAZOLE SODIUM 40 MG PO TBEC
40.0000 mg | DELAYED_RELEASE_TABLET | Freq: Every day | ORAL | Status: DC
  Administered 2018-11-20 – 2018-11-30 (×11): 40 mg via ORAL
  Filled 2018-11-13 (×12): qty 1

## 2018-11-13 MED ORDER — HALOPERIDOL LACTATE 5 MG/ML IJ SOLN
2.0000 mg | Freq: Once | INTRAMUSCULAR | Status: AC
  Administered 2018-11-14: 2 mg via INTRAVENOUS
  Filled 2018-11-13: qty 1

## 2018-11-13 MED ORDER — SODIUM CHLORIDE 0.9 % IV SOLN
INTRAVENOUS | Status: DC
  Administered 2018-11-13: 22:00:00 via INTRAVENOUS

## 2018-11-13 MED ORDER — CEFAZOLIN SODIUM-DEXTROSE 2-4 GM/100ML-% IV SOLN
2.0000 g | INTRAVENOUS | Status: AC
  Administered 2018-11-13: 2 g via INTRAVENOUS

## 2018-11-13 MED ORDER — TRAZODONE HCL 100 MG PO TABS
100.0000 mg | ORAL_TABLET | Freq: Every day | ORAL | Status: DC
  Administered 2018-11-21 – 2018-12-02 (×7): 100 mg via ORAL
  Filled 2018-11-13 (×8): qty 1

## 2018-11-13 MED ORDER — QUETIAPINE FUMARATE 25 MG PO TABS
50.0000 mg | ORAL_TABLET | Freq: Two times a day (BID) | ORAL | Status: DC
  Administered 2018-11-20 – 2018-12-04 (×19): 50 mg via ORAL
  Filled 2018-11-13 (×20): qty 2

## 2018-11-13 MED ORDER — LORAZEPAM 2 MG/ML IJ SOLN
1.0000 mg | Freq: Once | INTRAMUSCULAR | Status: AC
  Administered 2018-11-13: 17:00:00 1 mg via INTRAVENOUS

## 2018-11-13 MED ORDER — PAROXETINE HCL 30 MG PO TABS
30.0000 mg | ORAL_TABLET | Freq: Every day | ORAL | Status: DC
  Administered 2018-11-21 – 2018-11-30 (×10): 30 mg via ORAL
  Filled 2018-11-13 (×17): qty 1

## 2018-11-13 MED ORDER — ATORVASTATIN CALCIUM 20 MG PO TABS
20.0000 mg | ORAL_TABLET | Freq: Every day | ORAL | Status: DC
  Administered 2018-11-20 – 2018-11-27 (×7): 20 mg via ORAL
  Filled 2018-11-13 (×9): qty 1

## 2018-11-13 MED ORDER — SODIUM CHLORIDE 0.45 % IV SOLN: INTRAVENOUS | Status: DC

## 2018-11-13 MED ORDER — IOHEXOL 350 MG/ML SOLN
60.0000 mL | Freq: Once | INTRAVENOUS | Status: AC | PRN
  Administered 2018-11-13: 60 mL via INTRAVENOUS

## 2018-11-13 MED ORDER — HEPARIN SODIUM (PORCINE) 1000 UNIT/ML IJ SOLN
INTRAMUSCULAR | Status: AC
  Filled 2018-11-13: qty 1

## 2018-11-13 MED ORDER — MIDAZOLAM HCL 2 MG/2ML IJ SOLN
INTRAMUSCULAR | Status: DC | PRN
  Administered 2018-11-13: 1 mg via INTRAVENOUS

## 2018-11-13 MED ORDER — MIDAZOLAM HCL 5 MG/5ML IJ SOLN
INTRAMUSCULAR | Status: AC
  Filled 2018-11-13: qty 5

## 2018-11-13 MED ORDER — HEPARIN BOLUS VIA INFUSION
6000.0000 [IU] | Freq: Once | INTRAVENOUS | Status: AC
  Administered 2018-11-13: 6000 [IU] via INTRAVENOUS
  Filled 2018-11-13: qty 6000

## 2018-11-13 MED ORDER — GABAPENTIN 100 MG PO CAPS
100.0000 mg | ORAL_CAPSULE | Freq: Every day | ORAL | Status: DC
  Administered 2018-11-21 – 2018-11-28 (×6): 100 mg via ORAL
  Filled 2018-11-13 (×7): qty 1

## 2018-11-13 MED ORDER — HEPARIN (PORCINE) 25000 UT/250ML-% IV SOLN
1500.0000 [IU]/h | INTRAVENOUS | Status: DC
  Administered 2018-11-13 – 2018-11-14 (×2): 1500 [IU]/h via INTRAVENOUS
  Filled 2018-11-13 (×2): qty 250

## 2018-11-13 MED ORDER — ALTEPLASE 2 MG IJ SOLR
INTRAMUSCULAR | Status: DC | PRN
  Administered 2018-11-13: 2 mg
  Administered 2018-11-13: 4 mg

## 2018-11-13 SURGICAL SUPPLY — 16 items
CANISTER PENUMBRA ENGINE (MISCELLANEOUS) ×2 IMPLANT
CATH ANGIO 5F 100CM .035 PIG (CATHETERS) ×2 IMPLANT
CATH INDIGO 12 HTORQ 115 (CATHETERS) ×2 IMPLANT
CATH INDIGO SEP 12 (CATHETERS) ×2 IMPLANT
CATH INFINITI JR4 5F (CATHETERS) ×2 IMPLANT
CATH SELECT BERN TIP 5F 130 (CATHETERS) ×2 IMPLANT
COVER PROBE U/S 5X48 (MISCELLANEOUS) ×2 IMPLANT
DEVICE SAFEGUARD 24CM (GAUZE/BANDAGES/DRESSINGS) ×2 IMPLANT
GLIDEWIRE ADV .035X260CM (WIRE) ×2 IMPLANT
INTRODUCER PERFORM 12 30 .038 (SHEATH) ×2 IMPLANT
PACK ANGIOGRAPHY (CUSTOM PROCEDURE TRAY) ×2 IMPLANT
SHEATH BRITE TIP 5FRX11 (SHEATH) ×2 IMPLANT
SYR MEDRAD MARK 7 150ML (SYRINGE) ×2 IMPLANT
TUBING CONTRAST HIGH PRESS 72 (TUBING) ×2 IMPLANT
WIRE J 3MM .035X145CM (WIRE) ×2 IMPLANT
WIRE MAGIC TORQUE 260C (WIRE) ×2 IMPLANT

## 2018-11-13 NOTE — ED Provider Notes (Signed)
[facility] Emergency Department Provider Note  Time seen: 3:32 PM  I have reviewed the triage vital signs and the nursing notes.   HISTORY  Chief Complaint Altered Mental Status   HPI Chase Simmons is a 85 y.o. male with a past medical history of CHF, COPD, hypertension, hyperlipidemia, diabetes, presents to the emergency department for generalized weakness and hypoxia.  According to EMS report patient lives at a nursing facility was found to be hypoxic to 84% on 4 L, 91% on 6 L.  Patient also appears quite weak.   Per record review patient tested Covid positive sometime prior to 10/18/2018.  Patient cannot provide any history nor review of systems at this time.  Very weak appearing will occasionally moan or mumble something in response but is largely unintelligible.  Past Medical History:  Diagnosis Date  . Allergy   . Bladder cancer ([place])    a. followed by Dr. Jacqlyn Larsen  . Chronic combined systolic (congestive) and diastolic (congestive) heart failure ([place])    a. 07/2016 Echo: EF 30-35%, sev mid-apicalanteroseptal, ant, inf HK, Gr1 DD.  Marland Kitchen COPD (chronic obstructive pulmonary disease) ([place])   . Hyperlipidemia   . Hypertension   . NICM (nonischemic cardiomyopathy) ([place])    a. 07/2016 Echo: EF 30-35%, sev mid-apicalanteroseptal, ant, inf HK, Gr1 DD, mild MR, mildly dil LA, PASP 66mmHg - ? stress induced CM.  Marland Kitchen Nonobstructive Coronary atherosclerosis    a. 07/2016 Cath: LM nl, LAD nl, D1 40ost, LCX 60m, RCA nl, EF 25-30%.  Marland Kitchen PAF (paroxysmal atrial fibrillation) ([place])    a. Dx 08/2013. CHA2DS2VASc = 6-->no OAC 2/2 h/o falls and nocompliance.  . Tremor, essential    sees [facility] neurologist  . Type 2 diabetes mellitus with complication, without long-term current use of insulin ([place]) 08/01/2012  . Urinary incontinence     Patient Active Problem List   Diagnosis Date Noted  . COVID-19 10/19/2018  . COVID-19 virus infection 10/18/2018  . Weakness 06/07/2018  . Skin tear  of elbow without complication [id number]  . Lactic acidosis 06/06/2018  . Depression 04/20/2018  . Altered mental status   . Frequency of urination 03/01/2018  . Confusion 03/01/2018  . Bilateral leg edema 11/08/2016  . Vertigo 08/25/2016  . Nausea 08/01/2016  . CHF (congestive heart failure) ([place]) 08/01/2016  . Atypical chest pain 07/20/2016  . COPD (chronic obstructive pulmonary disease) ([place]) 03/15/2016  . Coronary atherosclerosis of native coronary artery 01/05/2016  . Aortic atherosclerosis ([place]) 01/05/2016  . Bradycardia 01/05/2016  . Anxiety 06/17/2015  . Malignant neoplasm of urinary bladder ([place]) 06/17/2015  . Allergic rhinitis 05/14/2015  . Knee pain, bilateral 03/16/2015  . PAF (paroxysmal atrial fibrillation) ([place]) 08/30/2013  . Acid reflux 08/30/2013  . Tremor, essential 10/22/2012  . Hyperlipidemia 10/22/2012  . HTN (hypertension) 08/01/2012  . Type 2 diabetes mellitus with complication, without long-term current use of insulin ([place]) 08/01/2012  . Benign prostatic hyperplasia with urinary obstruction 09/02/2011    Past Surgical History:  Procedure Laterality Date  . bladder cancer    . CATARACT EXTRACTION    . HERNIA REPAIR    . LEFT HEART CATH AND CORONARY ANGIOGRAPHY N/A 07/11/2016   Procedure: Left Heart Cath and Coronary Angiography;  Surgeon: Wellington Hampshire, MD;  Location: [facility];  Service: Cardiovascular;  Laterality: N/A;    Prior to Admission medications   Medication Sig Start Date End Date Taking? Authorizing Provider  acetaminophen (TYLENOL) 325 MG tablet Take 2  tablets (650 mg total) by mouth every 6 (six) hours as needed for mild pain (or Fever >/= 101). 06/22/15  Yes Gouru, Aruna, MD  albuterol (VENTOLIN HFA) 108 (90 Base) MCG/ACT inhaler Inhale 2 puffs into the lungs every 2 (two) hours as needed for wheezing.   Yes [provider]  amLODipine (NORVASC) 2.5 MG tablet Take 1 tablet (2.5 mg total) by mouth daily. 06/09/18  Yes  Gladstone Lighter, MD  aspirin EC 81 MG tablet Take 81 mg by mouth daily.   Yes [provider]  atorvastatin (LIPITOR) 20 MG tablet Take 1 tablet (20 mg total) by mouth daily. 07/10/18  Yes Leone Haven, MD  gabapentin (NEURONTIN) 100 MG capsule Take 100 mg by mouth at bedtime.  05/22/18  Yes [provider]  glimepiride (AMARYL) 2 MG tablet Take 2 mg by mouth daily with breakfast. 09/29/18  Yes [provider]  lisinopril (PRINIVIL,ZESTRIL) 20 MG tablet Take 1 tablet (20 mg total) by mouth daily. 06/15/17  Yes Leone Haven, MD  metFORMIN (GLUCOPHAGE) 500 MG tablet TAKE 1 TABLET BY MOUTH TWICE A DAY Patient taking differently: Take 500 mg by mouth 2 (two) times daily.  05/23/18  Yes Leone Haven, MD  pantoprazole (PROTONIX) 40 MG tablet Take 1 tablet (40 mg total) by mouth daily. 06/15/17  Yes Leone Haven, MD  PARoxetine (PAXIL) 30 MG tablet Take 30 mg by mouth daily. 10/09/18  Yes [provider]  polyethylene glycol (MIRALAX / GLYCOLAX) 17 g packet Take 17 g by mouth daily as needed for mild constipation. 06/08/18  Yes Gladstone Lighter, MD  primidone (MYSOLINE) 50 MG tablet Take 100 mg by mouth 3 (three) times daily.  09/29/18  Yes [provider]  QUEtiapine (SEROQUEL) 50 MG tablet Take 50 mg by mouth 2 (two) times daily.  10/06/18  Yes [provider]  QUEtiapine (SEROQUEL) 50 MG tablet Take 100 mg by mouth at bedtime.   Yes [provider]  tiotropium (SPIRIVA) 18 MCG inhalation capsule Place 18 mcg into inhaler and inhale as needed (wheezing).    Yes [provider]  traZODone (DESYREL) 50 MG tablet Take 100 mg by mouth at bedtime.  10/06/18  Yes [provider]  vitamin C (VITAMIN C) 500 MG tablet Take 1 tablet (500 mg total) by mouth daily. 10/25/18  Yes Ghimire, Henreitta Leber, MD  zinc sulfate 220 (50 Zn) MG capsule Take 1 capsule (220 mg total) by mouth daily. 10/25/18  Yes Ghimire, Henreitta Leber, MD   albuterol (PROVENTIL) (2.5 MG/3ML) 0.083% nebulizer solution Take 3 mLs (2.5 mg total) by nebulization every 2 (two) hours as needed for wheezing. Patient not taking: Reported on 11/13/2018 06/08/18   Gladstone Lighter, MD  insulin aspart (NOVOLOG) 100 UNIT/ML injection 0-9 Units, Subcutaneous, 3 times daily with mealS CBG < 70: Implement Hypoglycemia  CBG 70 - 120: 0 units CBG 121 - 150: 1 unit CBG 151 - 200: 2 units CBG 201 - 250: 3 units CBG 251 - 300: 5 units CBG 301 - 350: 7 units CBG 351 - 400: 9 units CBG > 400: call MD Patient not taking: Reported on 11/13/2018 10/24/18   Jonetta Osgood, MD  predniSONE (DELTASONE) 10 MG tablet Take 40 mg daily for 1 day, 30 mg daily for 1 day, 20 mg daily for 1 days,10 mg daily for 1 day, then stop Patient not taking: Reported on 11/13/2018 10/24/18   Jonetta Osgood, MD    Allergies  Allergen Reactions  . Latex Itching    Family History  Problem Relation Age of Onset  . Arthritis Mother   . Dementia Mother   . Cancer Maternal Uncle        prostate  . Cancer Maternal Aunt        breast cancer    Social History Social History   Tobacco Use  . Smoking status: Former Smoker    Packs/day: 1.50    Years: 40.00    Pack years: 60.00    Quit date: 02/03/2003    Years since quitting: 15.7  . Smokeless tobacco: Never Used  Substance Use Topics  . Alcohol use: Yes    Alcohol/week: 12.0 standard drinks    Types: 12 Standard drinks or equivalent per week    Frequency: Never    Comment: 1 drink with dinner  . Drug use: No    Review of Systems Unable to obtain adequate/accurate review of systems secondary to altered mental status and weakness ____________________________________________   PHYSICAL EXAM:  VITAL SIGNS: ED Triage Vitals [11/13/18 1454]  Enc Vitals Group     BP 103/79     Pulse Rate 84     Resp 18     Temp (!) 97.5 F (36.4 C)     Temp Source Axillary     SpO2 100 %     Weight 190 lb (86.2 kg)      Height 6\' 1"  (1.854 m)     Head Circumference      [facility] [place]      Pain Score      Pain Loc      Pain Edu?      Excl. in [place]?     Constitutional: Patient is somnolent, will awaken to voice momentarily before falling back asleep.  Patient will occasionally attempt to answer questions with largely unintelligible responses and mumbling. Eyes: Normal exam ENT      Head: Normocephalic and atraumatic.      Mouth/Throat: Somewhat dry appearing mucous membranes Cardiovascular: Appears to have a regular rhythm with occasional ectopic beats rate around 100 bpm. Respiratory: No noted tachypnea.  No respiratory distress.  No obvious wheeze rales or rhonchi on my exam. Gastrointestinal: Soft and nontender. No distention.   Musculoskeletal: Nontender with normal range of motion in all extremities.  Neurologic: Occasionally will attempt to answer questions largely unintelligible voice/mumbling.  Unable to follow commands for the most part.  Unable to obtain an adequate neurologic exam. Skin:  Skin is warm, dry and intact.  Psychiatric: Mood and affect are normal.  ____________________________________________    EKG  EKG viewed and interpreted by myself appears to show atrial fibrillation at 100 bpm with a widened QRS, left axis deviation nonspecific ST changes but no obvious ST elevation.  ____________________________________________    RADIOLOGY  Chest x-ray is negative for acute disease  CTA consistent with bilateral PEs right greater than left.  ____________________________________________   INITIAL IMPRESSION / ASSESSMENT AND PLAN / ED COURSE  Pertinent labs & imaging results that were available during my care of the patient were reviewed by me and considered in my medical decision making (see chart for details).   Patient presents to the emergency department for generalized weakness and found to be hypoxic to 84% on 4 L.  Is not entirely clear at this time if the patient wears oxygen  at baseline I have reviewed the patient's records and at least in mid to late October he was satting 100% on  room air.  Patiently currently requiring 6 L by nasal cannula to maintain O2 sats at 100%.  Very weak in appearance.  Very somnolent.  We will check labs, cultures, CT scan of the head, chest x-ray and continue to closely monitor.  Patient CTA consistent with bilateral PEs.  There is also groundglass opacities consistent with COVID-19.  We will start the patient on a heparin infusion.  Patient receiving IV fluids.  Patient is intermittently hypotensive however I feel the hypotension is likely related to his renal failure more so than the ease given no sign of right heart strain on CTA.  I discussed patient [facility] since it has been over 21 days since his last Covid test I do not believe he needs to be transferred to [facility] unit.  They would prefer the patient be admitted locally unless he has repeat testing comes back positive.  We will discuss with our admitting team to admit locally.  LENNYN VASBINDER was evaluated in Emergency Department on 11/13/2018 for the symptoms described in the history of present illness. He was evaluated in the context of the global COVID-19 pandemic, which necessitated consideration that the patient might be at risk for infection with the SARS-CoV-2 virus that causes COVID-19. Institutional protocols and algorithms that pertain to the evaluation of patients at risk for COVID-19 are in a state of rapid change based on information released by regulatory bodies including the CDC and federal and state organizations. These policies and algorithms were followed during the patient's care in the ED.  CRITICAL CARE Performed by: Harvest Dark   Total critical care time: 45 minutes  Critical care time was exclusive of separately billable procedures and treating other patients.  Critical care was necessary to treat or prevent imminent or life-threatening  deterioration.  Critical care was time spent personally by me on the following activities: development of treatment plan with patient and/or surrogate as well as nursing, discussions with consultants, evaluation of patient's response to treatment, examination of patient, obtaining history from patient or surrogate, ordering and performing treatments and interventions, ordering and review of laboratory studies, ordering and review of radiographic studies, pulse oximetry and re-evaluation of patient's condition.   ____________________________________________   FINAL CLINICAL IMPRESSION(S) / ED DIAGNOSES  weakness Hypotension Pulmonary embolus Renal insufficiency   Harvest Dark, MD 11/13/18 2215

## 2018-11-13 NOTE — Progress Notes (Signed)
eLink Physician-Brief Progress Note Patient Name: Chase Simmons DOB: 01/30/1933 MRN: [id number]   Date of Service  11/13/2018  HPI/Events of Note  Pt recently discharged from hospital following Rx for Covid-19 pneumonia returns with submassive PE and was treated with thrombectomy.  eICU Interventions  New patient evaluation completed.        [name] 11/13/2018, 11:27 PM

## 2018-11-13 NOTE — ED Triage Notes (Signed)
Pt arrives from [facility] for AMS and COVID + on 10/22. Pt was 84% on 4 L at facility. BP was 87/53. EMS VS 86% on 4L, placed on 6L [place] and came up 91%. BP came up 91/52. Hx COPD, dementia. CBG 173. HR 60-100 in a fib. Pt arrives breathing but not talking. Arrives with MOST form.

## 2018-11-13 NOTE — ED Notes (Signed)
Pt returned from CT °

## 2018-11-13 NOTE — ED Notes (Signed)
Pt snoring, no longer moving legs out of bed. Remains on 4 L [place] at this time.

## 2018-11-13 NOTE — Progress Notes (Signed)
ANTICOAGULATION CONSULT NOTE - Initial Consult  Pharmacy Consult for Heparin  Indication: pulmonary embolus  Allergies  Allergen Reactions  . Latex Itching    Patient Measurements: Height: 6\' 1"  (185.4 cm) Weight: 190 lb (86.2 kg) IBW/kg (Calculated) : 79.9 Heparin Dosing Weight: 86.2 kg   Vital Signs: Temp: 97.5 F (36.4 C) (11/10 1454) Temp Source: Axillary (11/10 1454) BP: 68/45 (11/10 1800) Pulse Rate: 80 (11/10 1800)  Labs: Recent Labs    11/13/18 1457  HGB 11.7*  HCT 36.7*  PLT 213  LABPROT 15.1  INR 1.2  CREATININE 1.95*  TROPONINIHS 46*    Estimated Creatinine Clearance: 32.4 mL/min (A) (by C-G formula based on SCr of 1.95 mg/dL (H)).   Medical History: Past Medical History:  Diagnosis Date  . Allergy   . Bladder cancer ([place])    a. followed by Dr. Jacqlyn Larsen  . Chronic combined systolic (congestive) and diastolic (congestive) heart failure ([place])    a. 07/2016 Echo: EF 30-35%, sev mid-apicalanteroseptal, ant, inf HK, Gr1 DD.  Marland Kitchen COPD (chronic obstructive pulmonary disease) ([place])   . Hyperlipidemia   . Hypertension   . NICM (nonischemic cardiomyopathy) ([place])    a. 07/2016 Echo: EF 30-35%, sev mid-apicalanteroseptal, ant, inf HK, Gr1 DD, mild MR, mildly dil LA, PASP 68mmHg - ? stress induced CM.  Marland Kitchen Nonobstructive Coronary atherosclerosis    a. 07/2016 Cath: LM nl, LAD nl, D1 40ost, LCX 50m, RCA nl, EF 25-30%.  Marland Kitchen PAF (paroxysmal atrial fibrillation) ([place])    a. Dx 08/2013. CHA2DS2VASc = 6-->no OAC 2/2 h/o falls and nocompliance.  . Tremor, essential    sees [facility] neurologist  . Type 2 diabetes mellitus with complication, without long-term current use of insulin ([place]) 08/01/2012  . Urinary incontinence     Medications:  (Not in a hospital admission)   Assessment: Pharmacy consulted to dose heparin in this 85 year old male admitted with PE.  No prior anticoag noted.  CrCl = 32.4 ml/min  Goal of Therapy:  Heparin level 0.3-0.7 units/ml Monitor platelets by  anticoagulation protocol: Yes   Plan:  Give 6000 units bolus x 1 Start heparin infusion at 1500 units/hr Check anti-Xa level in 8 hours and daily while on heparin Continue to monitor H&H and platelets  Amy Belloso D 11/13/2018,6:31 PM

## 2018-11-13 NOTE — ED Notes (Addendum)
Admitting MD in with pt. Covid test pending. Chase Simmons will not accept without another positive covid test. Pt will be admitted to [facility] then shipped if positive.

## 2018-11-13 NOTE — ED Notes (Signed)
CARELINK  CALLED  

## 2018-11-13 NOTE — ED Notes (Signed)
Nurse from special procedures given report. Asking about pt bed.

## 2018-11-13 NOTE — ED Notes (Signed)
Date and time results received: 11/13/18 1613  Test: lactic acid Critical Value: 2.3  Name of Provider Notified: Dr. Kerman Passey

## 2018-11-13 NOTE — Op Note (Signed)
[facility]  Percutaneous Study/Intervention Procedural Note   Date of Surgery: 11/13/2018,10:39 PM  Surgeon: Leotis Pain  Pre-operative Diagnosis: Symptomatic bilateral pulmonary emboli  Post-operative diagnosis:  Same  Procedure(s) Performed:  1.  Contrast injection right heart  2.  Thrombolysis with 4 mg of TPA in the right pulmonary arteries and 2 mg of TPA in the left lower lobe pulmonary artery   3.  Mechanical thrombectomy to the left lower lobe pulmonary artery, and the right upper, middle, and lower lobe pulmonary arteries  4.  Selective catheter placement right upper, middle, and lower lobe pulmonary arteries  5.  Selective catheter placement left lower lobe pulmonary artery    Anesthesia: Conscious sedation was administered under my direct supervision by the interventional radiology RN. IV Versed plus fentanyl were utilized. Continuous ECG, pulse oximetry and blood pressure was monitored throughout the entire procedure.  Versed and fentanyl were administered intravenously.  Conscious sedation was administered for a total of 30 minutes using 2 of Versed and 50 mcg of Fentanyl.  EBL: 400 cc  Sheath: 12 French right femoral vein  Contrast: 50 cc   Fluoroscopy Time: 12.4 minutes  Indications:  Patient presents with pulmonary emboli. The patient is symptomatic with hypoxemia and attention.  There is evidence of right heart strain on the CT angiogram. The patient is otherwise a good candidate for intervention and even the long-term benefits pulmonary angiography with thrombolysis is offered. The risks and benefits are reviewed long-term benefits are discussed. All questions are answered patient agrees to proceed.  Procedure:  ASHTUN PAKER a 85 y.o. male who was identified and appropriate procedural time out was performed.  The patient was then placed supine on the table and prepped and draped in the usual sterile fashion.  Ultrasound was used to  evaluate the right common femoral vein.  It had some nonocclusive thrombus present but the flow lumen appeared to be reasonably good and it was used for access.  A digital ultrasound image was acquired for the permanent record.  A Seldinger needle was used to access the right common femoral vein under direct ultrasound guidance.  A 0.035 J wire was advanced without resistance and a 5Fr sheath was placed and then upsized to an 12 French 30 cm sheath.    The wire and pigtail catheter were then negotiated into the right atrium and bolus injection of contrast was utilized to demonstrate the right ventricle and the pulmonary artery outflow. The wire and catheter were then negotiated into the main pulmonary artery where hand injection of contrast was utilized to demonstrate the pulmonary arteries and confirm the locations of the pulmonary emboli.  I advanced to the left lower lobe pulmonary artery first and after selective imaging showed extensive thrombosis, 2 mg of TPA was instilled in the left lower lobe pulmonary artery.  I then used the advantage wire and the JR4 catheter to advance over into the right main pulmonary artery and then into the right middle lobe pulmonary artery.  Selective imaging was performed and the extensive thrombus throughout the right main pulmonary artery and all 3 lobar branches were seen.  4 mg of TPA was instilled on the right side.  The Penumbra Cat 12 catheter was then advanced up into the pulmonary vasculature. The right lung was addressed first. Catheter was negotiated into the right middle lobe first and after selective imaging mechanical thrombectomy was performed using the penumbra cat 12 device. Follow-up imaging demonstrated a good result and  therefore the catheter was renegotiated into the right lower lobe pulmonary artery and again mechanical thrombectomy was performed after selective imaging. Passes were made with both the Penumbra catheter itself as well as introducing the  separator. Follow-up imaging was then performed.  This showed a marked improvement in both the right lower lobe and right middle lobe.  Tediously, using the select catheter and the advantage wire was able to gain access to the right upper lobe perform selective imaging showing sniffing and pulmonary embolus present in the right upper lobe.  The penumbra cat 12 catheter was advanced to the proximal portion of the right upper lobe and thrombectomy was again performed.  Some of the thrombus was extracted with thrombectomy although due to the difficult angle there was still some thrombus remaining in the right upper lobe on completion imaging  The Penumbra Cat 12 catheter was then negotiated to the opposite side. The left lung was then addressed. Catheter was negotiated into the left lower lobe pulmonary artery and mechanical thrombectomy was performed.  Passes were made with the separator in the left lower lobe.  Follow-up imaging demonstrated a good result only a small amount of residual thrombus was present in the left lung.  The CT scan had previously shown the left upper lobe did not have significant thrombus and that was seen on her final images as well.  After review these images wires were reintroduced and the catheters removed. Then, the sheath is then pulled and pressures held. A safeguard is placed.    Findings:   Right heart imaging:  Right atrium and right ventricle and the pulmonary outflow tract appears appears somewhat dilated and there is reflux of blood back into the IVC and SVC from the right atrium  Right lung: Extensive thrombus throughout the right main pulmonary artery and extending into all 3 primary pulmonary artery branches  Left lung: Left upper lobe did not have significant pulmonary embolus.  Fairly extensive pulmonary embolus was present in the left lower lobe pulmonary artery    Disposition: Patient was taken to the recovery room in stable condition having tolerated the  procedure well.  Jestine Bicknell 11/13/2018,10:39 PM

## 2018-11-13 NOTE — ED Notes (Signed)
Heparin verified with Loleta Books RN

## 2018-11-13 NOTE — Consult Note (Signed)
Name: Chase Simmons MRN: [id number] DOB: 1933-08-21    ADMISSION DATE:  11/13/2018 CONSULTATION DATE: 11/13/2018  REFERRING MD : Dr. Flossie Buffy   CHIEF COMPLAINT: Altered Mental Status   BRIEF PATIENT DESCRIPTION:  85 yo male DNR with advanced dementia recently diagnosed with COVID 10/18/18 and discharged from [facility] following treatment on 10/24/18 now admitted with acute hypoxic respiratory failure and hypotension secondary to submassive PE with likely right heart strain s/p pulmonary thrombectomy with catheter directed thrombolytic therapy 11/13/2018  SIGNIFICANT EVENTS/STUDIES:  11/10: CTA Chest revealed acute bilateral pulmonary emboli as detailed above, greatest on the right. While there is no CT evidence for right heart strain, there is some reflux of contrast into the IVC consistent with some degree of right-sided heart dysfunction. Bilateral ground-glass airspace opacities consistent with the patient's history of viral pneumonia. There is cholelithiasis without secondary signs of acute cholecystitis. Aortic Atherosclerosis. Vascular surgery consulted  11/10: Per vascular surgical team pt underwent pulmonary thrombectomy with catheter directed thrombolytic therapy. Pt admitted to ICU post procedure for closer monitoring   HISTORY OF PRESENT ILLNESS:  This is an 85 yo male with a PMH of Urinary Incontinence, Wheelchair Bound, Needs Complete Assistance with ADLS's, Type II Diabetes Mellitus, Advanced Dementia, Essential Tremor, Paroxysmal Atrial Fibrillation, Nonobstructive Coronary Atherosclerosis, Nonischemic Cardiomyopathy, HTN, HLD, COPD, Chronic Combined Systolic and Diastolic CHF, Bladder Cancer (followed by Urologist Dr Jacqlyn Larsen), and Allergies.  Pt recently discharged from [facility] on 10/24/2018 following COVID-19 diagnosis on 10/18/2018.  During hospitalization at [facility] he received steroids only for respiratory symptoms and at discharge due to  frailty/advanced dementia pt deemed not an anticoagulation candidate and discharged on aspirin.  He presented to [facility] ER via EMS on 11/10 from [facility] with altered mental status and hypoxia. Pts daughter also reported he had an unwitnessed fall at [facility] on 11/09. EMS reported on there arrival pt hypoxic with O2 sats 86% on 4L O2, bp 87/53, and pt in atrial fibrillation hr 60-100 bpm.  In the ER lab results revealed BUN 61, creatinine 1.95, troponin 46, CRP 7.4, lactic acid 2.3, pct 0.23, fibrinogen 487, d-dimer 4,472.19, UA negative for UTI.  Pt remained hypotensive with altered mental status (moaning with unintelligible speech) despite fluid resuscitation. CXR negative, however CTA Chest revealed acute bilateral pulmonary emboli greatest on the right without evidence of right heart strain, but some reflux of contrast into the IVC consistent with some degree of right sided heart dysfunction, viral pneumonia, cholelithiasis without signs of acute cholecystitis.  Vascular surgery consulted and pt underwent pulmonary thrombectomy with catheter directed thrombolytic therapy. Pt admitted to ICU post procedure for closer monitoring.    PAST MEDICAL HISTORY :   has a past medical history of Allergy, Bladder cancer ([facility]), Chronic combined systolic (congestive) and diastolic (congestive) heart failure (HCC), COPD (chronic obstructive pulmonary disease) ([place]), Hyperlipidemia, Hypertension, NICM (nonischemic cardiomyopathy) ([place]), Nonobstructive Coronary atherosclerosis, PAF (paroxysmal atrial fibrillation) ([place]), Tremor, essential, Type 2 diabetes mellitus with complication, without long-term current use of insulin ([place]) (08/01/2012), and Urinary incontinence.  has a past surgical history that includes bladder cancer; Cataract extraction; Hernia repair; and LEFT HEART CATH AND CORONARY ANGIOGRAPHY (N/A, 07/11/2016). Prior to Admission medications   Medication Sig Start Date End Date Taking? Authorizing  Provider  acetaminophen (TYLENOL) 325 MG tablet Take 2 tablets (650 mg total) by mouth every 6 (six) hours as needed for mild pain (or Fever >/= 101). 06/22/15  Yes Nicholes Mango, MD  albuterol (  VENTOLIN HFA) 108 (90 Base) MCG/ACT inhaler Inhale 2 puffs into the lungs every 2 (two) hours as needed for wheezing.   Yes [provider]  amLODipine (NORVASC) 2.5 MG tablet Take 1 tablet (2.5 mg total) by mouth daily. 06/09/18  Yes Gladstone Lighter, MD  aspirin EC 81 MG tablet Take 81 mg by mouth daily.   Yes [provider]  atorvastatin (LIPITOR) 20 MG tablet Take 1 tablet (20 mg total) by mouth daily. 07/10/18  Yes Leone Haven, MD  gabapentin (NEURONTIN) 100 MG capsule Take 100 mg by mouth at bedtime.  05/22/18  Yes [provider]  glimepiride (AMARYL) 2 MG tablet Take 2 mg by mouth daily with breakfast. 09/29/18  Yes [provider]  lisinopril (PRINIVIL,ZESTRIL) 20 MG tablet Take 1 tablet (20 mg total) by mouth daily. 06/15/17  Yes Leone Haven, MD  metFORMIN (GLUCOPHAGE) 500 MG tablet TAKE 1 TABLET BY MOUTH TWICE A DAY Patient taking differently: Take 500 mg by mouth 2 (two) times daily.  05/23/18  Yes Leone Haven, MD  pantoprazole (PROTONIX) 40 MG tablet Take 1 tablet (40 mg total) by mouth daily. 06/15/17  Yes Leone Haven, MD  PARoxetine (PAXIL) 30 MG tablet Take 30 mg by mouth daily. 10/09/18  Yes [provider]  polyethylene glycol (MIRALAX / GLYCOLAX) 17 g packet Take 17 g by mouth daily as needed for mild constipation. 06/08/18  Yes Gladstone Lighter, MD  primidone (MYSOLINE) 50 MG tablet Take 100 mg by mouth 3 (three) times daily.  09/29/18  Yes [provider]  QUEtiapine (SEROQUEL) 50 MG tablet Take 50 mg by mouth 2 (two) times daily.  10/06/18  Yes [provider]  QUEtiapine (SEROQUEL) 50 MG tablet Take 100 mg by mouth at bedtime.   Yes [provider]  tiotropium (SPIRIVA) 18 MCG inhalation capsule  Place 18 mcg into inhaler and inhale as needed (wheezing).    Yes [provider]  traZODone (DESYREL) 50 MG tablet Take 100 mg by mouth at bedtime.  10/06/18  Yes [provider]  vitamin C (VITAMIN C) 500 MG tablet Take 1 tablet (500 mg total) by mouth daily. 10/25/18  Yes Ghimire, Henreitta Leber, MD  zinc sulfate 220 (50 Zn) MG capsule Take 1 capsule (220 mg total) by mouth daily. 10/25/18  Yes Ghimire, Henreitta Leber, MD  albuterol (PROVENTIL) (2.5 MG/3ML) 0.083% nebulizer solution Take 3 mLs (2.5 mg total) by nebulization every 2 (two) hours as needed for wheezing. Patient not taking: Reported on 11/13/2018 06/08/18   Gladstone Lighter, MD  insulin aspart (NOVOLOG) 100 UNIT/ML injection 0-9 Units, Subcutaneous, 3 times daily with mealS CBG < 70: Implement Hypoglycemia  CBG 70 - 120: 0 units CBG 121 - 150: 1 unit CBG 151 - 200: 2 units CBG 201 - 250: 3 units CBG 251 - 300: 5 units CBG 301 - 350: 7 units CBG 351 - 400: 9 units CBG > 400: call MD Patient not taking: Reported on 11/13/2018 10/24/18   Jonetta Osgood, MD  predniSONE (DELTASONE) 10 MG tablet Take 40 mg daily for 1 day, 30 mg daily for 1 day, 20 mg daily for 1 days,10 mg daily for 1 day, then stop Patient not taking: Reported on 11/13/2018 10/24/18   Jonetta Osgood, MD   Allergies  Allergen Reactions  . Latex Itching    FAMILY HISTORY:  family history includes Arthritis in his mother; Cancer in his maternal aunt and maternal uncle; Dementia  in his mother. SOCIAL HISTORY:  reports that he quit smoking about 15 years ago. He has a 60.00 pack-year smoking history. He has never used smokeless tobacco. He reports current alcohol use of about 12.0 standard drinks of alcohol per week. He reports that he does not use drugs.  REVIEW OF SYSTEMS:   Unable to assess pt confused and currently nonverbal   SUBJECTIVE:  Unable to assess pt confused and currently nonverbal   VITAL SIGNS: Temp:  [97.5 F (36.4 C)] 97.5  F (36.4 C) (11/10 1454) Pulse Rate:  [41-84] 70 (11/10 2101) Resp:  [18-40] 20 (11/10 2101) BP: (68-133)/(45-123) 107/68 (11/10 2101) SpO2:  [100 %] 100 % (11/10 2101) Weight:  [86.2 kg] 86.2 kg (11/10 1454)  PHYSICAL EXAMINATION: General: chronically ill appearing elderly frail male, NAD NRB in place  Neuro: pt currently alert but non verbal and not following commands, chronic essential tremor, PERRL  HEENT: supple, no JVD  Cardiovascular: irregular irregular with occasional PVC's, no R/G  Lungs: diminished throughout, even, non labored  Abdomen: +BS x4, soft, non distended  Musculoskeletal: normal bulk, no edema  Skin: right femoral vascular site with PAD in place no bleeding or hematoma present   Recent Labs  Lab 11/13/18 1457  NA 156*  K 3.6  CL 121*  CO2 22  BUN 61*  CREATININE 1.95*  GLUCOSE 134*   Recent Labs  Lab 11/13/18 1457  HGB 11.7*  HCT 36.7*  WBC 9.1  PLT 213   Ct Angio Chest Pe W And/or Wo Contrast  Result Date: 11/13/2018 CLINICAL DATA:  Hypoxia. Elevated D-dimer. The patient is COVID positive EXAM: CT ANGIOGRAPHY CHEST WITH CONTRAST TECHNIQUE: Multidetector CT imaging of the chest was performed using the standard protocol during bolus administration of intravenous contrast. Multiplanar CT image reconstructions and MIPs were obtained to evaluate the vascular anatomy. CONTRAST:  76mL OMNIPAQUE IOHEXOL 350 MG/ML SOLN COMPARISON:  CT dated June 06, 2018. FINDINGS: Cardiovascular: Contrast injection is sufficient to demonstrate satisfactory opacification of the pulmonary arteries to the segmental level.There are acute bilateral pulmonary emboli. For example there is an embolus in the main right pulmonary artery extending into the right, middle, and upper lobar branches. There are acute pulmonary emboli involving the left lower lobe pulmonary artery extending into segmental and subsegmental branches. The main pulmonary artery is within normal limits for size. There  is no evidence for right-sided heart strain with the RV LV ratio measuring less than 0.9. There is some reflux into the IVC consistent with some underlying cardiac dysfunction. Atherosclerotic changes are noted throughout the thoracic aorta. Mediastinum/Nodes: --there is a 4.5 x 1.6 cm right upper paratracheal mass. This is essentially unchanged from prior CT in 2018 and is favored to represent a inferior right thyroid nodule/mass. --No axillary lymphadenopathy. --No supraclavicular lymphadenopathy. --Normal thyroid gland. --The esophagus is unremarkable Lungs/Pleura: There is scattered ground-glass airspace opacities bilaterally. There is no pneumothorax. There is no significant pleural effusion. The trachea is unremarkable. Upper Abdomen: There is cholelithiasis without CT evidence for acute cholecystitis. Musculoskeletal: There is an old healed fracture of the sternum. No new acute displaced fracture. Review of the MIP images confirms the above findings. IMPRESSION: 1. Acute bilateral pulmonary emboli as detailed above, greatest on the right. While there is no CT evidence for right heart strain, there is some reflux of contrast into the IVC consistent with some degree of right-sided heart dysfunction. 2. Bilateral ground-glass airspace opacities consistent with the patient's history of viral pneumonia. 3. There  is cholelithiasis without secondary signs of acute cholecystitis. Aortic Atherosclerosis (ICD10-I70.0). These results were called by telephone at the time of interpretation on 11/13/2018 at 6:01 pm to provider [facility] , who verbally acknowledged these results. Electronically Signed   By: Constance Holster M.D.   On: 11/13/2018 18:05   Dg Chest Port 1 View  Result Date: 11/13/2018 CLINICAL DATA:  Shortness of breath.  COVID-19. EXAM: PORTABLE CHEST 1 VIEW COMPARISON:  10/23/2018 FINDINGS: The heart size and mediastinal contours are within normal limits. Both lungs are clear. The visualized  skeletal structures are unremarkable. IMPRESSION: No active disease. Electronically Signed   By: Lorriane Shire M.D.   On: 11/13/2018 15:32    ASSESSMENT / PLAN:  Acute hypoxic respiratory failure secondary to submassive PE with likely right heart strain s/p pulmonary thrombectomy and catheter directed thrombolytic therapy 11/13/2018   Recent COVID-19 infection  HxL COPD Prn supplemental O2 for dyspnea and/or hypoxia Continue heparin gtt for now  Vascular surgery consulted appreciate input Prn bronchodilator therapy  Maintain airborne and contact precautions for now COVID-19 results pending  Trend WBC and monitor fever curve  Trend PCT  Follow cultures  No indication for abx, if pt becomes febrile or if pct increases will start empiric iv abx   Hypotension secondary to submassive PE with likely right heart strain  Mildly elevated troponin likely secondary to demand ischemia in setting of submassive PE  Hx: Paroxysmal atrial fibrillation, HLD, HTN, Chronic combined systolic and diastolic CHF, and Nonischemic cardiomyopathy  Continuous telemetry monitoring  BNP pending  Very gentle fluid resuscitation due to CHF hx to maintain map >65 if pt remains hypotensive will start neo-synephrine gtt  Hold outpatient antihypertensives for now   Acute renal failure secondary to hypotension  Hypernatremia  Lactic acidosis  Trend lactic acid Replace electrolytes as indicated Monitor UOP Avoid nephrotoxic medications  Will repeat BMP if pt remains hypernatremia will change iv fluids to D5W @50  ml/hr   Intraop acute blood loss anemia  VTE px: heparin gtt dosing per pharmacy  Trend CBC  Monitor for s/sx of bleeding and transfuse for hgb <7  Type II diabetes mellitus CBG's q4hrs  SSI   Poor po intake  Dietitian consulted to assess nutritional needs  Will consult speech therapy to assess swallowing will keep NPO for now pending speech therapy recommendations   Advanced dementia   Unwitnessed fall  Stat CT Head pending Frequent reorientation  Once pt able to tolerate po's will continue outpatient paxil, seroquel, and trazodone  Fall precautions   -Spoke with pts daughter Chase Simmons via telephone to update her regarding pt condition and plan of care. She informed me the pt is wheelchair bound at baseline (he has not ambulated in over a year) with very advanced dementia (speaks occasionally with frequent repetitive/stuttering words but is not oriented at baseline).  We also discussed pts code status and [name] Jans stated the pt is a Full Code. Due to multiple co-morbities and advanced dementia I will consult Palliative Care to discuss goals of treatment.    Marda Stalker, [place] Pager [phone] (please enter 7 digits) PCCM Consult Pager [phone] (please enter 7 digits)

## 2018-11-13 NOTE — Consult Note (Signed)
[facility] Consult Note  MRN : [id number]  Chase Simmons is a 85 y.o. (July 09, 1933) male who presents with chief complaint of  Chief Complaint  Patient presents with  . Altered Mental Status  .  History of Present Illness: I am asked to see the patient by Dr. Flossie Buffy for a very large PE.  The patient was brought from a skilled nursing facility today secondary to hypoxia and hypotension.  The patient had sats in the 80s that responded to supplemental oxygen therapy and got into the 90s.  He had blood pressures in the 80s that did not respond to saline bolus.  His pressures have stabilized somewhat since the initiation of heparin in the last hour or so.  As part of his work-up, a CT angiogram was performed which I have independently reviewed.  This demonstrates a very large pulmonary embolus bilaterally.  The right main pulmonary artery and all 3 branches have involvement of thrombus.  On the left side, the left lower lobe has fairly extensive pulmonary embolus.  Although there is not obvious CT criteria for right heart strain, the radiologist noted reflux of contrast into the IVC which would suggest right heart dysfunction.  The patient cannot provide any history and this is obtained from the previous medical record, the family, and the primary service.  Current Facility-Administered Medications  Medication Dose Route Frequency Provider Last Rate Last Dose  . 0.45 % sodium chloride infusion   Intravenous Continuous Tu, Ching T, DO      . 0.9 %  sodium chloride infusion   Intravenous Continuous , Erskine Squibb, MD      . atorvastatin (LIPITOR) tablet 20 mg  20 mg Oral Daily Tu, Ching T, DO      . ceFAZolin (ANCEF) IVPB 2g/100 mL premix  2 g Intravenous 30 min Pre-Op Algernon Huxley, MD      . fentaNYL (SUBLIMAZE) 100 MCG/2ML injection           . gabapentin (NEURONTIN) capsule 100 mg  100 mg Oral QHS Tu, Ching T, DO      . heparin 1000 UNIT/ML injection           .  heparin ADULT infusion 100 units/mL (25000 units/252mL sodium chloride 0.45%)  1,500 Units/hr Intravenous Continuous Tu, Ching T, DO 15 mL/hr at 11/13/18 1831 1,500 Units/hr at 11/13/18 1831  . midazolam (VERSED) 5 MG/5ML injection           . pantoprazole (PROTONIX) EC tablet 40 mg  40 mg Oral Daily Tu, Ching T, DO      . PARoxetine (PAXIL) tablet 30 mg  30 mg Oral Daily Tu, Ching T, DO      . polyethylene glycol (MIRALAX / GLYCOLAX) packet 17 g  17 g Oral Daily PRN Tu, Ching T, DO      . primidone (MYSOLINE) tablet 100 mg  100 mg Oral TID Tu, Ching T, DO      . QUEtiapine (SEROQUEL) tablet 100 mg  100 mg Oral QHS Tu, Ching T, DO      . QUEtiapine (SEROQUEL) tablet 50 mg  50 mg Oral BID Tu, Ching T, DO      . traZODone (DESYREL) tablet 100 mg  100 mg Oral QHS Tu, Ching T, DO       Current Outpatient Medications  Medication Sig Dispense Refill  . acetaminophen (TYLENOL) 325 MG tablet Take 2 tablets (650 mg total) by mouth every 6 (six) hours  as needed for mild pain (or Fever >/= 101).    Marland Kitchen albuterol (VENTOLIN HFA) 108 (90 Base) MCG/ACT inhaler Inhale 2 puffs into the lungs every 2 (two) hours as needed for wheezing.    Marland Kitchen amLODipine (NORVASC) 2.5 MG tablet Take 1 tablet (2.5 mg total) by mouth daily.    Marland Kitchen aspirin EC 81 MG tablet Take 81 mg by mouth daily.    Marland Kitchen atorvastatin (LIPITOR) 20 MG tablet Take 1 tablet (20 mg total) by mouth daily. 90 tablet 3  . gabapentin (NEURONTIN) 100 MG capsule Take 100 mg by mouth at bedtime.     Marland Kitchen glimepiride (AMARYL) 2 MG tablet Take 2 mg by mouth daily with breakfast.    . lisinopril (PRINIVIL,ZESTRIL) 20 MG tablet Take 1 tablet (20 mg total) by mouth daily. 90 tablet 1  . metFORMIN (GLUCOPHAGE) 500 MG tablet TAKE 1 TABLET BY MOUTH TWICE A DAY (Patient taking differently: Take 500 mg by mouth 2 (two) times daily. ) 180 tablet 2  . pantoprazole (PROTONIX) 40 MG tablet Take 1 tablet (40 mg total) by mouth daily. 90 tablet 1  . PARoxetine (PAXIL) 30 MG tablet Take  30 mg by mouth daily.    . polyethylene glycol (MIRALAX / GLYCOLAX) 17 g packet Take 17 g by mouth daily as needed for mild constipation. 14 each 0  . primidone (MYSOLINE) 50 MG tablet Take 100 mg by mouth 3 (three) times daily.     . QUEtiapine (SEROQUEL) 50 MG tablet Take 50 mg by mouth 2 (two) times daily.     . QUEtiapine (SEROQUEL) 50 MG tablet Take 100 mg by mouth at bedtime.    Marland Kitchen tiotropium (SPIRIVA) 18 MCG inhalation capsule Place 18 mcg into inhaler and inhale as needed (wheezing).     . traZODone (DESYREL) 50 MG tablet Take 100 mg by mouth at bedtime.     . vitamin C (VITAMIN C) 500 MG tablet Take 1 tablet (500 mg total) by mouth daily.    Marland Kitchen zinc sulfate 220 (50 Zn) MG capsule Take 1 capsule (220 mg total) by mouth daily.    Marland Kitchen albuterol (PROVENTIL) (2.5 MG/3ML) 0.083% nebulizer solution Take 3 mLs (2.5 mg total) by nebulization every 2 (two) hours as needed for wheezing. (Patient not taking: Reported on 11/13/2018) 75 mL 12  . insulin aspart (NOVOLOG) 100 UNIT/ML injection 0-9 Units, Subcutaneous, 3 times daily with mealS CBG < 70: Implement Hypoglycemia  CBG 70 - 120: 0 units CBG 121 - 150: 1 unit CBG 151 - 200: 2 units CBG 201 - 250: 3 units CBG 251 - 300: 5 units CBG 301 - 350: 7 units CBG 351 - 400: 9 units CBG > 400: call MD (Patient not taking: Reported on 11/13/2018) 10 mL 11  . predniSONE (DELTASONE) 10 MG tablet Take 40 mg daily for 1 day, 30 mg daily for 1 day, 20 mg daily for 1 days,10 mg daily for 1 day, then stop (Patient not taking: Reported on 11/13/2018) 10 tablet 0    Past Medical History:  Diagnosis Date  . Allergy   . Bladder cancer ([place])    a. followed by Dr. Jacqlyn Larsen  . Chronic combined systolic (congestive) and diastolic (congestive) heart failure ([place])    a. 07/2016 Echo: EF 30-35%, sev mid-apicalanteroseptal, ant, inf HK, Gr1 DD.  Marland Kitchen COPD (chronic obstructive pulmonary disease) ([place])   . Hyperlipidemia   . Hypertension   . NICM (nonischemic cardiomyopathy)  ([place])    a. 07/2016  Echo: EF 30-35%, sev mid-apicalanteroseptal, ant, inf HK, Gr1 DD, mild MR, mildly dil LA, PASP 6mmHg - ? stress induced CM.  Marland Kitchen Nonobstructive Coronary atherosclerosis    a. 07/2016 Cath: LM nl, LAD nl, D1 40ost, LCX 66m, RCA nl, EF 25-30%.  Marland Kitchen PAF (paroxysmal atrial fibrillation) ([place])    a. Dx 08/2013. CHA2DS2VASc = 6-->no OAC 2/2 h/o falls and nocompliance.  . Tremor, essential    sees [facility] neurologist  . Type 2 diabetes mellitus with complication, without long-term current use of insulin ([place]) 08/01/2012  . Urinary incontinence     Past Surgical History:  Procedure Laterality Date  . bladder cancer    . CATARACT EXTRACTION    . HERNIA REPAIR    . LEFT HEART CATH AND CORONARY ANGIOGRAPHY N/A 07/11/2016   Procedure: Left Heart Cath and Coronary Angiography;  Surgeon: Wellington Hampshire, MD;  Location: [facility];  Service: Cardiovascular;  Laterality: N/A;     Social History   Tobacco Use  . Smoking status: Former Smoker    Packs/day: 1.50    Years: 40.00    Pack years: 60.00    Quit date: 02/03/2003    Years since quitting: 15.7  . Smokeless tobacco: Never Used  Substance Use Topics  . Alcohol use: Yes    Alcohol/week: 12.0 standard drinks    Types: 12 Standard drinks or equivalent per week    Frequency: Never    Comment: 1 drink with dinner  . Drug use: No  Currently residing in a nursing facility   Family History Family History  Problem Relation Age of Onset  . Arthritis Mother   . Dementia Mother   . Cancer Maternal Uncle        prostate  . Cancer Maternal Aunt        breast cancer    Allergies  Allergen Reactions  . Latex Itching     REVIEW OF SYSTEMS (Negative unless checked) Unable to obtain review of systems secondary to the patient's poor mental status and chronic dementia  Physical Examination  Vitals:   11/13/18 1900 11/13/18 1930 11/13/18 2030 11/13/18 2101  BP: (!) 86/54 (!) 90/46 (!) 82/60 107/68  Pulse: 76 80 65  70  Resp: 20 20 (!) 38 20  Temp:      TempSrc:      SpO2: 100% 100% 100% 100%  Weight:      Height:       Body mass index is 25.07 kg/m. Gen:  WD/WN.  Debilitated elderly male Head: [place]/AT, No temporalis wasting.  Ear/Nose/Throat: Hearing grossly intact, nares w/o erythema or drainage, oropharynx w/o Erythema/Exudate Eyes: Sclera non-icteric, conjunctiva clear Neck: Trachea midline.  No JVD.  Pulmonary: Tachypneic with somewhat labored respirations on supplemental oxygen Cardiac: Irregular Vascular:  Vessel Right Left  Radial Palpable Palpable                                   Gastrointestinal: soft, non-tender/non-distended.  Musculoskeletal: M/S 5/5 throughout.  Extremities without ischemic changes.  No deformity or atrophy. Mild LE edema. Neurologic: Difficult to assess with his dementia and poor MS Psychiatric: Judgment and insight are poor Dermatologic: No rashes or ulcers noted.  No cellulitis or open wounds. Lymph : No Cervical, Axillary, or Inguinal lymphadenopathy.      CBC Lab Results  Component Value Date   WBC 9.1 11/13/2018   HGB 11.7 (L) 11/13/2018  HCT 36.7 (L) 11/13/2018   MCV 108.9 (H) 11/13/2018   PLT 213 11/13/2018    BMET    Component Value Date/Time   NA 156 (H) 11/13/2018 1457   NA 137 01/08/2014 1611   K 3.6 11/13/2018 1457   K 4.4 01/08/2014 1611   CL 121 (H) 11/13/2018 1457   CL 102 01/08/2014 1611   CO2 22 11/13/2018 1457   CO2 26 01/08/2014 1611   GLUCOSE 134 (H) 11/13/2018 1457   GLUCOSE 172 (H) 01/08/2014 1611   BUN 61 (H) 11/13/2018 1457   BUN 18 01/08/2014 1611   CREATININE 1.95 (H) 11/13/2018 1457   CREATININE 1.13 01/08/2014 1611   CALCIUM 8.6 (L) 11/13/2018 1457   CALCIUM 8.4 (L) 01/08/2014 1611   GFRNONAA 31 (L) 11/13/2018 1457   GFRNONAA >60 01/08/2014 1611   GFRNONAA >60 12/15/2011 1257   GFRAA 36 (L) 11/13/2018 1457   GFRAA >60 01/08/2014 1611   GFRAA >60 12/15/2011 1257   Estimated Creatinine Clearance:  32.4 mL/min (A) (by C-G formula based on SCr of 1.95 mg/dL (H)).  COAG Lab Results  Component Value Date   INR 1.2 11/13/2018   INR 1.05 07/10/2016   INR 1.11 07/27/2015    Radiology Ct Angio Chest Pe W And/or Wo Contrast  Result Date: 11/13/2018 CLINICAL DATA:  Hypoxia. Elevated D-dimer. The patient is COVID positive EXAM: CT ANGIOGRAPHY CHEST WITH CONTRAST TECHNIQUE: Multidetector CT imaging of the chest was performed using the standard protocol during bolus administration of intravenous contrast. Multiplanar CT image reconstructions and MIPs were obtained to evaluate the vascular anatomy. CONTRAST:  18mL OMNIPAQUE IOHEXOL 350 MG/ML SOLN COMPARISON:  CT dated June 06, 2018. FINDINGS: Cardiovascular: Contrast injection is sufficient to demonstrate satisfactory opacification of the pulmonary arteries to the segmental level.There are acute bilateral pulmonary emboli. For example there is an embolus in the main right pulmonary artery extending into the right, middle, and upper lobar branches. There are acute pulmonary emboli involving the left lower lobe pulmonary artery extending into segmental and subsegmental branches. The main pulmonary artery is within normal limits for size. There is no evidence for right-sided heart strain with the RV LV ratio measuring less than 0.9. There is some reflux into the IVC consistent with some underlying cardiac dysfunction. Atherosclerotic changes are noted throughout the thoracic aorta. Mediastinum/Nodes: --there is a 4.5 x 1.6 cm right upper paratracheal mass. This is essentially unchanged from prior CT in 2018 and is favored to represent a inferior right thyroid nodule/mass. --No axillary lymphadenopathy. --No supraclavicular lymphadenopathy. --Normal thyroid gland. --The esophagus is unremarkable Lungs/Pleura: There is scattered ground-glass airspace opacities bilaterally. There is no pneumothorax. There is no significant pleural effusion. The trachea is  unremarkable. Upper Abdomen: There is cholelithiasis without CT evidence for acute cholecystitis. Musculoskeletal: There is an old healed fracture of the sternum. No new acute displaced fracture. Review of the MIP images confirms the above findings. IMPRESSION: 1. Acute bilateral pulmonary emboli as detailed above, greatest on the right. While there is no CT evidence for right heart strain, there is some reflux of contrast into the IVC consistent with some degree of right-sided heart dysfunction. 2. Bilateral ground-glass airspace opacities consistent with the patient's history of viral pneumonia. 3. There is cholelithiasis without secondary signs of acute cholecystitis. Aortic Atherosclerosis (ICD10-I70.0). These results were called by telephone at the time of interpretation on 11/13/2018 at 6:01 pm to provider [facility] , who verbally acknowledged these results. Electronically Signed   By: Harrell Gave  Green M.D.   On: 11/13/2018 18:05   Dg Chest Port 1 View  Result Date: 11/13/2018 CLINICAL DATA:  Shortness of breath.  COVID-19. EXAM: PORTABLE CHEST 1 VIEW COMPARISON:  10/23/2018 FINDINGS: The heart size and mediastinal contours are within normal limits. Both lungs are clear. The visualized skeletal structures are unremarkable. IMPRESSION: No active disease. Electronically Signed   By: Lorriane Shire M.D.   On: 11/13/2018 15:32   Dg Chest Port 1 View  Result Date: 10/20/2018 CLINICAL DATA:  Shortness of breath.  COVID-19 virus infection. EXAM: PORTABLE CHEST 1 VIEW COMPARISON:  10/18/2018 FINDINGS: The heart size and mediastinal contours are within normal limits. Aortic atherosclerosis. Stable prominence of pulmonary interstitial markings. No evidence of pulmonary airspace disease or pleural effusion. The visualized skeletal structures are unremarkable. IMPRESSION: Stable exam.  No active disease. Electronically Signed   By: Marlaine Hind M.D.   On: 10/20/2018 12:12   Dg Chest Portable 1  View  Result Date: 10/18/2018 CLINICAL DATA:  COVID-19 positive. Diabetes. Hypertension. COPD. Bladder cancer. EXAM: PORTABLE CHEST 1 VIEW COMPARISON:  06/06/2018 FINDINGS: Two frontal views of the chest. Midline trachea. Borderline cardiomegaly. Atherosclerosis in the transverse aorta. No pleural effusion or pneumothorax. Moderate pulmonary interstitial thickening is not significantly changed. Bibasilar opacities, favored to represent scarring or subsegmental atelectasis, similar. IMPRESSION: Chronic interstitial thickening, likely related to the clinical history of COPD. No acute superimposed process. Aortic Atherosclerosis (ICD10-I70.0). Electronically Signed   By: Abigail Miyamoto M.D.   On: 10/18/2018 18:33   Dg Chest Port 1v Same Day  Result Date: 10/23/2018 CLINICAL DATA:  Shortness of breath. History of diabetes, COPD and bladder cancer. EXAM: PORTABLE CHEST 1 VIEW COMPARISON:  Radiographs 10/20/2018 and 10/18/2018. CT 06/06/2018. FINDINGS: 1148 hours. The heart size and mediastinal contours are stable. There is aortic atherosclerosis. There is stable mild interstitial and central airway thickening which appears chronic. No superimposed airspace disease, pleural effusion or pneumothorax. And old rib fractures noted on the right. IMPRESSION: 1. Stable chronic lung disease. No acute cardiopulmonary process. 2. Aortic atherosclerosis. Electronically Signed   By: Richardean Sale M.D.   On: 10/23/2018 12:13      Assessment/Plan 1.  Submassive PE with likely right heart strain, elevated lactate, hypoxia, and hypotension.  Patient has multiple other medical comorbidities and with his instability we are going to go ahead and proceed with pulmonary thrombectomy and catheter directed thrombolytic therapy tonight.  I discussed the case in detail with his daughters who are agreeable to proceed.  He will likely go to the ICU following this and will need to maintain anticoagulation going forward.  This is a  severe, critical and life-threatening problem with a high risk of morbidity and mortality. 2.  Relatively recent COVID-19 infection.  Patient had this approximately 2 to 3 weeks ago.  This likely made him hypercoagulable as well as the prolonged immobility promoting the likelihood of thromboembolic complications. 3.  Dementia.  Seems to be relatively significant.  Consent was obtained from the family. 4.  Diabetes. Stable on outpatient medication and blood glucose control important in reducing the progression of atherosclerotic disease. Also, involved in wound healing. On appropriate medications. 5.  Hypertension.  Currently hypotensive due to pulmonary embolus.    Leotis Pain, MD  11/13/2018 9:34 PM    This note was created with Dragon medical transcription system.  Any error is purely unintentional

## 2018-11-13 NOTE — H&P (Addendum)
History and Physical    Chase Simmons [id number] DOB: [birth year] DOA: 11/13/2018  PCP: [name], MD  Patient coming from: [facility]  I have personally briefly reviewed patient's old medical records in [place]  Chief Complaint: Altered mental status and hypoxic  HPI: Chase Simmons is a 85 y.o. male with medical history significant of advanced dementia, chronic systolic heart failure, paroxysmal atrial fibrillation (not a good candidate for anticoagulation due to frailty and advanced age), hypertension, COPD, type 2 diabetes, recent COVID infection (admitted to [facility] from 10/15-10/21) who presents from outside facility with concerns of altered mental status and hypoxia.  Patient was asleep and snoring after receiving IV Ativan at the time of my evaluation and was unable to provide any history.  Reportedly, patient was noted to be hypoxic down to 84% and required 4 L and had blood pressure of 80s over 50s.  Oxygen was uptitrated to 6 L nasal cannula via EMS and improved up to 91% oxygen saturation. He was eventually able to wean back down to 4 L in the ED with oxygen saturation of 100%. BP remains at systolic of 80 despite 1.5 L of IV fluids.    ED Course: Patient was afebrile and hypotensive down to 80s over 50s with a MAP of 66 at the time my evaluation.  CBC shows no leukocytosis and had stable anemia with hemoglobin of 11.7.  CMP showed sodium of 156, glucose of 134, creatinine of 1.95/BUN of 61 from a prior of 0.91.  Troponin of 46.  Lactate of 2.6.  Procalcitonin of 0.23.  Fibrinogen of 487.  Of fibrin derivative 4472.  Review of Systems: Unable to obtain due to patient's altered mental status Past Medical History:  Diagnosis Date  . Allergy   . Bladder cancer ([place])    a. followed by Dr. Jacqlyn Larsen  . Chronic combined systolic (congestive) and diastolic (congestive) heart failure ([place])    a. 07/2016 Echo: EF 30-35%, sev mid-apicalanteroseptal, ant,  inf HK, Gr1 DD.  Marland Kitchen COPD (chronic obstructive pulmonary disease) ([place])   . Hyperlipidemia   . Hypertension   . NICM (nonischemic cardiomyopathy) ([place])    a. 07/2016 Echo: EF 30-35%, sev mid-apicalanteroseptal, ant, inf HK, Gr1 DD, mild MR, mildly dil LA, PASP 47mmHg - ? stress induced CM.  Marland Kitchen Nonobstructive Coronary atherosclerosis    a. 07/2016 Cath: LM nl, LAD nl, D1 40ost, LCX 19m, RCA nl, EF 25-30%.  Marland Kitchen PAF (paroxysmal atrial fibrillation) ([place])    a. Dx 08/2013. CHA2DS2VASc = 6-->no OAC 2/2 h/o falls and nocompliance.  . Tremor, essential    sees [facility] neurologist  . Type 2 diabetes mellitus with complication, without long-term current use of insulin ([place]) 08/01/2012  . Urinary incontinence     Past Surgical History:  Procedure Laterality Date  . bladder cancer    . CATARACT EXTRACTION    . HERNIA REPAIR    . LEFT HEART CATH AND CORONARY ANGIOGRAPHY N/A 07/11/2016   Procedure: Left Heart Cath and Coronary Angiography;  Surgeon: Wellington Hampshire, MD;  Location: [facility];  Service: Cardiovascular;  Laterality: N/A;     reports that he quit smoking about 15 years ago. He has a 60.00 pack-year smoking history. He has never used smokeless tobacco. He reports current alcohol use of about 12.0 standard drinks of alcohol per week. He reports that he does not use drugs.  Allergies  Allergen Reactions  . Latex Itching  Family History  Problem Relation Age of Onset  . Arthritis Mother   . Dementia Mother   . Cancer Maternal Uncle        prostate  . Cancer Maternal Aunt        breast cancer   Unable to verify family hx due to patient's altered mental status.   Prior to Admission medications   Medication Sig Start Date End Date Taking? Authorizing Provider  acetaminophen (TYLENOL) 325 MG tablet Take 2 tablets (650 mg total) by mouth every 6 (six) hours as needed for mild pain (or Fever >/= 101). 06/22/15  Yes Gouru, Aruna, MD  albuterol (VENTOLIN HFA) 108 (90 Base) MCG/ACT  inhaler Inhale 2 puffs into the lungs every 2 (two) hours as needed for wheezing.   Yes [provider]  amLODipine (NORVASC) 2.5 MG tablet Take 1 tablet (2.5 mg total) by mouth daily. 06/09/18  Yes Gladstone Lighter, MD  aspirin EC 81 MG tablet Take 81 mg by mouth daily.   Yes [provider]  atorvastatin (LIPITOR) 20 MG tablet Take 1 tablet (20 mg total) by mouth daily. 07/10/18  Yes Leone Haven, MD  gabapentin (NEURONTIN) 100 MG capsule Take 100 mg by mouth at bedtime.  05/22/18  Yes [provider]  glimepiride (AMARYL) 2 MG tablet Take 2 mg by mouth daily with breakfast. 09/29/18  Yes [provider]  lisinopril (PRINIVIL,ZESTRIL) 20 MG tablet Take 1 tablet (20 mg total) by mouth daily. 06/15/17  Yes Leone Haven, MD  metFORMIN (GLUCOPHAGE) 500 MG tablet TAKE 1 TABLET BY MOUTH TWICE A DAY Patient taking differently: Take 500 mg by mouth 2 (two) times daily.  05/23/18  Yes Leone Haven, MD  pantoprazole (PROTONIX) 40 MG tablet Take 1 tablet (40 mg total) by mouth daily. 06/15/17  Yes Leone Haven, MD  PARoxetine (PAXIL) 30 MG tablet Take 30 mg by mouth daily. 10/09/18  Yes [provider]  polyethylene glycol (MIRALAX / GLYCOLAX) 17 g packet Take 17 g by mouth daily as needed for mild constipation. 06/08/18  Yes Gladstone Lighter, MD  primidone (MYSOLINE) 50 MG tablet Take 100 mg by mouth 3 (three) times daily.  09/29/18  Yes [provider]  QUEtiapine (SEROQUEL) 50 MG tablet Take 50 mg by mouth 2 (two) times daily.  10/06/18  Yes [provider]  QUEtiapine (SEROQUEL) 50 MG tablet Take 100 mg by mouth at bedtime.   Yes [provider]  tiotropium (SPIRIVA) 18 MCG inhalation capsule Place 18 mcg into inhaler and inhale as needed (wheezing).    Yes [provider]  traZODone (DESYREL) 50 MG tablet Take 100 mg by mouth at bedtime.  10/06/18  Yes [provider]  vitamin C (VITAMIN C) 500 MG  tablet Take 1 tablet (500 mg total) by mouth daily. 10/25/18  Yes Ghimire, Henreitta Leber, MD  zinc sulfate 220 (50 Zn) MG capsule Take 1 capsule (220 mg total) by mouth daily. 10/25/18  Yes Ghimire, Henreitta Leber, MD  albuterol (PROVENTIL) (2.5 MG/3ML) 0.083% nebulizer solution Take 3 mLs (2.5 mg total) by nebulization every 2 (two) hours as needed for wheezing. Patient not taking: Reported on 11/13/2018 06/08/18   Gladstone Lighter, MD  insulin aspart (NOVOLOG) 100 UNIT/ML injection 0-9 Units, Subcutaneous, 3 times daily with mealS CBG < 70: Implement Hypoglycemia  CBG 70 - 120: 0 units CBG 121 - 150: 1 unit CBG 151 - 200: 2 units CBG 201 - 250: 3 units CBG 251 -  300: 5 units CBG 301 - 350: 7 units CBG 351 - 400: 9 units CBG > 400: call MD Patient not taking: Reported on 11/13/2018 10/24/18   Jonetta Osgood, MD  predniSONE (DELTASONE) 10 MG tablet Take 40 mg daily for 1 day, 30 mg daily for 1 day, 20 mg daily for 1 days,10 mg daily for 1 day, then stop Patient not taking: Reported on 11/13/2018 10/24/18   Jonetta Osgood, MD    Physical Exam: Vitals:   11/13/18 1830 11/13/18 1832 11/13/18 1900 11/13/18 1930  BP:  (!) 72/54 (!) 86/54 (!) 90/46  Pulse: 75 79 76 80  Resp: (!) 39 (!) 40 20 20  Temp:      TempSrc:      SpO2: 100% 100% 100% 100%  Weight:      Height:        Constitutional: NAD, calm, comfortable elderly male asleep and laying in bed snoring Vitals:   11/13/18 1830 11/13/18 1832 11/13/18 1900 11/13/18 1930  BP:  (!) 72/54 (!) 86/54 (!) 90/46  Pulse: 75 79 76 80  Resp: (!) 39 (!) 40 20 20  Temp:      TempSrc:      SpO2: 100% 100% 100% 100%  Weight:      Height:       Eyes: PERRL, lids and conjunctivae normal ENMT: Mucous membranes are moist. Posterior pharynx clear of any exudate or lesions. Neck: normal, supple, no masses Respiratory: clear to auscultation bilaterally, no wheezing, no crackles. Normal respiratory effort on 4L via [place]. No accessory muscle use.   Cardiovascular: Regular rate and rhythm, no murmurs / rubs / gallops. No extremity edema. 2+ pedal pulses.  Abdomen: no tenderness, no masses palpated. Bowel sounds positive.  Musculoskeletal: no clubbing / cyanosis. No joint deformity upper and lower extremities.Normal muscle tone.  Skin: no rashes, lesions, ulcers. No induration Neurologic: unable to access given patient drowsiness and altered mental status, pt moves with light touch.  Psychiatric: unable to access given patient drowsiness and altered mental status    Labs on Admission: I have personally reviewed following labs and imaging studies  CBC: Recent Labs  Lab 11/13/18 1457  WBC 9.1  NEUTROABS 6.7  HGB 11.7*  HCT 36.7*  MCV 108.9*  PLT 123456   Basic Metabolic Panel: Recent Labs  Lab 11/13/18 1457  NA 156*  K 3.6  CL 121*  CO2 22  GLUCOSE 134*  BUN 61*  CREATININE 1.95*  CALCIUM 8.6*   GFR: Estimated Creatinine Clearance: 32.4 mL/min (A) (by C-G formula based on SCr of 1.95 mg/dL (H)). Liver Function Tests: Recent Labs  Lab 11/13/18 1457  AST 22  ALT 16  ALKPHOS 85  BILITOT 0.8  PROT 7.1  ALBUMIN 3.1*   No results for input(s): LIPASE, AMYLASE in the last 168 hours. No results for input(s): AMMONIA in the last 168 hours. Coagulation Profile: Recent Labs  Lab 11/13/18 1457  INR 1.2   Cardiac Enzymes: No results for input(s): CKTOTAL, CKMB, CKMBINDEX, TROPONINI in the last 168 hours. BNP (last 3 results) No results for input(s): PROBNP in the last 8760 hours. HbA1C: No results for input(s): HGBA1C in the last 72 hours. CBG: No results for input(s): GLUCAP in the last 168 hours. Lipid Profile: Recent Labs    11/13/18 1457  TRIG 125   Thyroid Function Tests: No results for input(s): TSH, T4TOTAL, FREET4, T3FREE, THYROIDAB in the last 72 hours. Anemia Panel: Recent Labs    11/13/18  [place]   Urine analysis:    Component Value Date/Time   COLORURINE AMBER (A) 11/13/2018  1457   APPEARANCEUR CLOUDY (A) 11/13/2018 1457   APPEARANCEUR Clear 10/24/2017 1538   LABSPEC 1.030 11/13/2018 1457   PHURINE 5.0 11/13/2018 1457   GLUCOSEU NEGATIVE 11/13/2018 1457   HGBUR NEGATIVE 11/13/2018 [place] 11/13/2018 1457   BILIRUBINUR negative 02/27/2018 1636   BILIRUBINUR Negative 10/24/2017 1538   KETONESUR NEGATIVE 11/13/2018 1457   PROTEINUR 30 (A) 11/13/2018 1457   UROBILINOGEN 0.2 02/27/2018 1636   NITRITE NEGATIVE 11/13/2018 1457   LEUKOCYTESUR NEGATIVE 11/13/2018 1457    Radiological Exams on Admission: Ct Angio Chest Pe W And/or Wo Contrast  Result Date: 11/13/2018 CLINICAL DATA:  Hypoxia. Elevated D-dimer. The patient is COVID positive EXAM: CT ANGIOGRAPHY CHEST WITH CONTRAST TECHNIQUE: Multidetector CT imaging of the chest was performed using the standard protocol during bolus administration of intravenous contrast. Multiplanar CT image reconstructions and MIPs were obtained to evaluate the vascular anatomy. CONTRAST:  66mL OMNIPAQUE IOHEXOL 350 MG/ML SOLN COMPARISON:  CT dated June 06, 2018. FINDINGS: Cardiovascular: Contrast injection is sufficient to demonstrate satisfactory opacification of the pulmonary arteries to the segmental level.There are acute bilateral pulmonary emboli. For example there is an embolus in the main right pulmonary artery extending into the right, middle, and upper lobar branches. There are acute pulmonary emboli involving the left lower lobe pulmonary artery extending into segmental and subsegmental branches. The main pulmonary artery is within normal limits for size. There is no evidence for right-sided heart strain with the RV LV ratio measuring less than 0.9. There is some reflux into the IVC consistent with some underlying cardiac dysfunction. Atherosclerotic changes are noted throughout the thoracic aorta. Mediastinum/Nodes: --there is a 4.5 x 1.6 cm right upper paratracheal mass. This is essentially unchanged from prior  CT in 2018 and is favored to represent a inferior right thyroid nodule/mass. --No axillary lymphadenopathy. --No supraclavicular lymphadenopathy. --Normal thyroid gland. --The esophagus is unremarkable Lungs/Pleura: There is scattered ground-glass airspace opacities bilaterally. There is no pneumothorax. There is no significant pleural effusion. The trachea is unremarkable. Upper Abdomen: There is cholelithiasis without CT evidence for acute cholecystitis. Musculoskeletal: There is an old healed fracture of the sternum. No new acute displaced fracture. Review of the MIP images confirms the above findings. IMPRESSION: 1. Acute bilateral pulmonary emboli as detailed above, greatest on the right. While there is no CT evidence for right heart strain, there is some reflux of contrast into the IVC consistent with some degree of right-sided heart dysfunction. 2. Bilateral ground-glass airspace opacities consistent with the patient's history of viral pneumonia. 3. There is cholelithiasis without secondary signs of acute cholecystitis. Aortic Atherosclerosis (ICD10-I70.0). These results were called by telephone at the time of interpretation on 11/13/2018 at 6:01 pm to provider [facility] , who verbally acknowledged these results. Electronically Signed   By: Constance Holster M.D.   On: 11/13/2018 18:05   Dg Chest Port 1 View  Result Date: 11/13/2018 CLINICAL DATA:  Shortness of breath.  COVID-19. EXAM: PORTABLE CHEST 1 VIEW COMPARISON:  10/23/2018 FINDINGS: The heart size and mediastinal contours are within normal limits. Both lungs are clear. The visualized skeletal structures are unremarkable. IMPRESSION: No active disease. Electronically Signed   By: Lorriane Shire M.D.   On: 11/13/2018 15:32    EKG: Independently reviewed.   Assessment/Plan Acute hypoxic respiratory failure and hypotension secondary to massive pulmonary embolism  -Bilateral acute pulmonary embolism  with significant clot burden.  Case  was discussed with vascular surgeon Dr. Lucky Cowboy who will meet family at bedside to consent for percutaneous thrombectomy. -Suspect this is due to complications of recent COVID infection (10/15) - continue heparin gtt until procedure  Chronic systolic heart failure -Patient is euvolemic  Paroxysmal atrial fibrillation -Patient rate controlled.  Previously deemed not a candidate for anticoagulation given frailty/advanced age/advanced dementia  History of hypertension -We will hold amlodipine given hypotension in the setting of pulmonary embolism  COPD -Stable.  Continue bronchodilator  Essential tremor -Continue primidone when able to tolerate PO  DVT prophylaxis: Heparin gtt Code Status: MOST form reviewed that patient would get cardiac resuscitation and other advanced life support  Family Communication: Updated daughters [name] and Harm Teo and they will speak with vascular surgery to give consent for thrombosis.  disposition Plan: Home with at least 2 midnight stays  Consults called: Vascular surgery and critical care for admission afterwards Admission status: inpatient, patient will require ICU critical care given massive PE and hemodynamic instability and pending percutaneous thrombectomy.    Orene Desanctis DO [facility]   If 7PM-7AM, please contact night-coverage [email or web address] Password [email or web address]  11/13/2018, 8:02 PM

## 2018-11-14 ENCOUNTER — Encounter: Payer: Self-pay | Admitting: Vascular Surgery

## 2018-11-14 ENCOUNTER — Inpatient Hospital Stay: Payer: Medicare HMO

## 2018-11-14 DIAGNOSIS — L8990 Pressure ulcer of unspecified site, unspecified stage: Secondary | ICD-10-CM | POA: Diagnosis present

## 2018-11-14 DIAGNOSIS — F0391 Unspecified dementia with behavioral disturbance: Secondary | ICD-10-CM | POA: Diagnosis present

## 2018-11-14 DIAGNOSIS — N170 Acute kidney failure with tubular necrosis: Secondary | ICD-10-CM

## 2018-11-14 DIAGNOSIS — Z7189 Other specified counseling: Secondary | ICD-10-CM

## 2018-11-14 DIAGNOSIS — F03918 Unspecified dementia, unspecified severity, with other behavioral disturbance: Secondary | ICD-10-CM | POA: Diagnosis present

## 2018-11-14 DIAGNOSIS — E870 Hyperosmolality and hypernatremia: Secondary | ICD-10-CM

## 2018-11-14 DIAGNOSIS — Z515 Encounter for palliative care: Secondary | ICD-10-CM

## 2018-11-14 DIAGNOSIS — I959 Hypotension, unspecified: Secondary | ICD-10-CM | POA: Diagnosis present

## 2018-11-14 DIAGNOSIS — R451 Restlessness and agitation: Secondary | ICD-10-CM | POA: Diagnosis present

## 2018-11-14 LAB — SODIUM, URINE, RANDOM: Sodium, Ur: 40 mmol/L

## 2018-11-14 LAB — CBC WITH DIFFERENTIAL/PLATELET
Abs Immature Granulocytes: 0.04 10*3/uL (ref 0.00–0.07)
Basophils Absolute: 0 10*3/uL (ref 0.0–0.1)
Basophils Relative: 0 %
Eosinophils Absolute: 0.2 10*3/uL (ref 0.0–0.5)
Eosinophils Relative: 2 %
HCT: 30.9 % — ABNORMAL LOW (ref 39.0–52.0)
Hemoglobin: 10 g/dL — ABNORMAL LOW (ref 13.0–17.0)
Immature Granulocytes: 0 %
Lymphocytes Relative: 11 %
Lymphs Abs: 1.2 10*3/uL (ref 0.7–4.0)
MCH: 34.1 pg — ABNORMAL HIGH (ref 26.0–34.0)
MCHC: 32.4 g/dL (ref 30.0–36.0)
MCV: 105.5 fL — ABNORMAL HIGH (ref 80.0–100.0)
Monocytes Absolute: 0.8 10*3/uL (ref 0.1–1.0)
Monocytes Relative: 8 %
Neutro Abs: 8.4 10*3/uL — ABNORMAL HIGH (ref 1.7–7.7)
Neutrophils Relative %: 79 %
Platelets: 188 10*3/uL (ref 150–400)
RBC: 2.93 MIL/uL — ABNORMAL LOW (ref 4.22–5.81)
RDW: 15.1 % (ref 11.5–15.5)
WBC: 10.7 10*3/uL — ABNORMAL HIGH (ref 4.0–10.5)
nRBC: 0 % (ref 0.0–0.2)

## 2018-11-14 LAB — COMPREHENSIVE METABOLIC PANEL
ALT: 12 U/L (ref 0–44)
AST: 21 U/L (ref 15–41)
Albumin: 2.5 g/dL — ABNORMAL LOW (ref 3.5–5.0)
Alkaline Phosphatase: 73 U/L (ref 38–126)
Anion gap: 9 (ref 5–15)
BUN: 53 mg/dL — ABNORMAL HIGH (ref 8–23)
CO2: 22 mmol/L (ref 22–32)
Calcium: 7.7 mg/dL — ABNORMAL LOW (ref 8.9–10.3)
Chloride: 124 mmol/L — ABNORMAL HIGH (ref 98–111)
Creatinine, Ser: 1.25 mg/dL — ABNORMAL HIGH (ref 0.61–1.24)
GFR calc Af Amer: >60 mL/min (ref >60)
GFR calc non Af Amer: 53 mL/min — ABNORMAL LOW (ref >60)
Glucose, Bld: 106 mg/dL — ABNORMAL HIGH (ref 70–99)
Potassium: 3.7 mmol/L (ref 3.5–5.1)
Sodium: 155 mmol/L — ABNORMAL HIGH (ref 135–145)
Total Bilirubin: 0.7 mg/dL (ref 0.3–1.2)
Total Protein: 5.9 g/dL — ABNORMAL LOW (ref 6.5–8.1)

## 2018-11-14 LAB — BASIC METABOLIC PANEL
Anion gap: 10 (ref 5–15)
BUN: 58 mg/dL — ABNORMAL HIGH (ref 8–23)
CO2: 23 mmol/L (ref 22–32)
Calcium: 7.9 mg/dL — ABNORMAL LOW (ref 8.9–10.3)
Chloride: 120 mmol/L — ABNORMAL HIGH (ref 98–111)
Creatinine, Ser: 1.75 mg/dL — ABNORMAL HIGH (ref 0.61–1.24)
GFR calc Af Amer: 41 mL/min — ABNORMAL LOW (ref >60)
GFR calc non Af Amer: 35 mL/min — ABNORMAL LOW (ref >60)
Glucose, Bld: 117 mg/dL — ABNORMAL HIGH (ref 70–99)
Potassium: 3.7 mmol/L (ref 3.5–5.1)
Sodium: 153 mmol/L — ABNORMAL HIGH (ref 135–145)

## 2018-11-14 LAB — HEPARIN LEVEL (UNFRACTIONATED)
Heparin Unfractionated: 1.25 IU/mL — ABNORMAL HIGH (ref 0.30–0.70)
Heparin Unfractionated: 1.05 IU/mL — ABNORMAL HIGH (ref 0.30–0.70)
Heparin Unfractionated: 0.74 IU/mL — ABNORMAL HIGH (ref 0.30–0.70)

## 2018-11-14 LAB — GLUCOSE, CAPILLARY
Glucose-Capillary: 100 mg/dL — ABNORMAL HIGH (ref 70–99)
Glucose-Capillary: 116 mg/dL — ABNORMAL HIGH (ref 70–99)
Glucose-Capillary: 116 mg/dL — ABNORMAL HIGH (ref 70–99)
Glucose-Capillary: 119 mg/dL — ABNORMAL HIGH (ref 70–99)
Glucose-Capillary: 126 mg/dL — ABNORMAL HIGH (ref 70–99)
Glucose-Capillary: 163 mg/dL — ABNORMAL HIGH (ref 70–99)
Glucose-Capillary: 33 mg/dL — CL (ref 70–99)
Glucose-Capillary: 94 mg/dL (ref 70–99)

## 2018-11-14 LAB — CBC
HCT: 29.5 % — ABNORMAL LOW (ref 39.0–52.0)
Hemoglobin: 9.4 g/dL — ABNORMAL LOW (ref 13.0–17.0)
MCH: 34.3 pg — ABNORMAL HIGH (ref 26.0–34.0)
MCHC: 31.9 g/dL (ref 30.0–36.0)
MCV: 107.7 fL — ABNORMAL HIGH (ref 80.0–100.0)
Platelets: 179 10*3/uL (ref 150–400)
RBC: 2.74 MIL/uL — ABNORMAL LOW (ref 4.22–5.81)
RDW: 15.3 % (ref 11.5–15.5)
WBC: 11.2 10*3/uL — ABNORMAL HIGH (ref 4.0–10.5)
nRBC: 0 % (ref 0.0–0.2)

## 2018-11-14 LAB — BRAIN NATRIURETIC PEPTIDE: B Natriuretic Peptide: 31 pg/mL (ref 0.0–100.0)

## 2018-11-14 LAB — SARS CORONAVIRUS 2 (TAT 6-24 HRS): SARS Coronavirus 2: POSITIVE — AB

## 2018-11-14 LAB — OSMOLALITY, URINE: Osmolality, Ur: 568 mOsm/kg (ref 300–900)

## 2018-11-14 LAB — PHOSPHORUS: Phosphorus: 5.3 mg/dL — ABNORMAL HIGH (ref 2.5–4.6)

## 2018-11-14 LAB — MAGNESIUM: Magnesium: 1.6 mg/dL — ABNORMAL LOW (ref 1.7–2.4)

## 2018-11-14 LAB — LACTIC ACID, PLASMA: Lactic Acid, Venous: 2.3 mmol/L (ref 0.5–1.9)

## 2018-11-14 MED ORDER — DEXTROSE 50 % IV SOLN
1.0000 | Freq: Once | INTRAVENOUS | Status: AC
  Administered 2018-11-14: 19:00:00 50 mL via INTRAVENOUS

## 2018-11-14 MED ORDER — DEXTROSE 5 % IV SOLN
INTRAVENOUS | Status: DC
  Administered 2018-11-14 (×2): via INTRAVENOUS

## 2018-11-14 MED ORDER — DEXMEDETOMIDINE HCL IN NACL 400 MCG/100ML IV SOLN
0.4000 ug/kg/h | INTRAVENOUS | Status: DC
  Administered 2018-11-14: 21:00:00 0.6 ug/kg/h via INTRAVENOUS
  Administered 2018-11-15: 0.3 ug/kg/h via INTRAVENOUS
  Filled 2018-11-14: qty 100

## 2018-11-14 MED ORDER — SODIUM CHLORIDE 0.9 % IV BOLUS
1000.0000 mL | Freq: Once | INTRAVENOUS | Status: AC
  Administered 2018-11-14: 11:00:00 1000 mL via INTRAVENOUS

## 2018-11-14 MED ORDER — MAGNESIUM SULFATE 2 GM/50ML IV SOLN
2.0000 g | Freq: Once | INTRAVENOUS | Status: AC
  Administered 2018-11-14: 03:00:00 2 g via INTRAVENOUS
  Filled 2018-11-14: qty 50

## 2018-11-14 MED ORDER — SODIUM CHLORIDE 0.9 % IV SOLN
0.0000 ug/min | INTRAVENOUS | Status: DC
  Administered 2018-11-14: 02:00:00 20 ug/min via INTRAVENOUS
  Filled 2018-11-14: qty 1

## 2018-11-14 MED ORDER — DEXTROSE 10 % IV SOLN
INTRAVENOUS | Status: DC
  Administered 2018-11-14 – 2018-11-22 (×6): via INTRAVENOUS

## 2018-11-14 MED ORDER — NOREPINEPHRINE 4 MG/250ML-% IV SOLN
0.0000 ug/min | INTRAVENOUS | Status: DC
  Administered 2018-11-14 (×2): 5 ug/min via INTRAVENOUS
  Administered 2018-11-14: 12:00:00 8 ug/min via INTRAVENOUS
  Filled 2018-11-14 (×3): qty 250

## 2018-11-14 MED ORDER — LACTATED RINGERS IV BOLUS
500.0000 mL | Freq: Once | INTRAVENOUS | Status: AC
  Administered 2018-11-14: 500 mL via INTRAVENOUS

## 2018-11-14 MED ORDER — FENTANYL CITRATE (PF) 100 MCG/2ML IJ SOLN
12.5000 ug | INTRAMUSCULAR | Status: DC | PRN
  Administered 2018-11-14 (×4): 25 ug via INTRAVENOUS
  Filled 2018-11-14 (×4): qty 2

## 2018-11-14 MED ORDER — HALOPERIDOL LACTATE 5 MG/ML IJ SOLN
INTRAMUSCULAR | Status: AC
  Filled 2018-11-14: qty 1

## 2018-11-14 MED ORDER — HALOPERIDOL LACTATE 5 MG/ML IJ SOLN
2.0000 mg | Freq: Once | INTRAMUSCULAR | Status: AC
  Administered 2018-11-14: 15:00:00 2 mg via INTRAVENOUS

## 2018-11-14 MED ORDER — CHLORHEXIDINE GLUCONATE CLOTH 2 % EX PADS
6.0000 | MEDICATED_PAD | Freq: Every day | CUTANEOUS | Status: DC
  Administered 2018-11-14 – 2018-11-18 (×5): 6 via TOPICAL

## 2018-11-14 MED ORDER — LORAZEPAM 2 MG/ML IJ SOLN
0.5000 mg | INTRAMUSCULAR | Status: DC | PRN
  Administered 2018-11-14: 0.5 mg via INTRAVENOUS
  Administered 2018-11-15: 1 mg via INTRAVENOUS
  Filled 2018-11-14 (×2): qty 1

## 2018-11-14 MED ORDER — LACTATED RINGERS IV BOLUS
500.0000 mL | Freq: Once | INTRAVENOUS | Status: AC
  Administered 2018-11-14: 04:00:00 500 mL via INTRAVENOUS

## 2018-11-14 MED ORDER — HEPARIN (PORCINE) 25000 UT/250ML-% IV SOLN
1250.0000 [IU]/h | INTRAVENOUS | Status: DC
  Administered 2018-11-15: 900 [IU]/h via INTRAVENOUS
  Administered 2018-11-16: 1000 [IU]/h via INTRAVENOUS
  Administered 2018-11-17 – 2018-11-20 (×5): 1250 [IU]/h via INTRAVENOUS
  Administered 2018-11-21: 1350 [IU]/h via INTRAVENOUS
  Administered 2018-11-22: 1450 [IU]/h via INTRAVENOUS
  Administered 2018-11-23: 1200 [IU]/h via INTRAVENOUS
  Administered 2018-11-24 – 2018-11-25 (×3): 1250 [IU]/h via INTRAVENOUS
  Filled 2018-11-14 (×15): qty 250

## 2018-11-14 MED ORDER — DEXTROSE 50 % IV SOLN
INTRAVENOUS | Status: AC
  Filled 2018-11-14: qty 50

## 2018-11-14 MED ORDER — LORAZEPAM 2 MG/ML IJ SOLN
1.0000 mg | Freq: Once | INTRAMUSCULAR | Status: AC
  Administered 2018-11-14: 1 mg via INTRAVENOUS
  Filled 2018-11-14: qty 1

## 2018-11-14 MED ORDER — LORAZEPAM 2 MG/ML IJ SOLN
1.0000 mg | Freq: Once | INTRAMUSCULAR | Status: AC
  Administered 2018-11-14: 21:00:00 1 mg via INTRAVENOUS

## 2018-11-14 MED ORDER — LORAZEPAM 2 MG/ML IJ SOLN
INTRAMUSCULAR | Status: AC
  Filled 2018-11-14: qty 1

## 2018-11-14 MED ORDER — NOREPINEPHRINE 4 MG/250ML-% IV SOLN
INTRAVENOUS | Status: AC
  Administered 2018-11-14: 05:00:00 5 ug/min via INTRAVENOUS
  Filled 2018-11-14: qty 250

## 2018-11-14 NOTE — Progress Notes (Signed)
PROGRESS NOTE                                                                                                                                                                                                             Patient Demographics:    Chase Simmons, is a 85 y.o. male, DOB - 12-17-1933, [id number]  Admit date - 11/13/2018   Admitting Physician Orene Desanctis, DO  Outpatient Primary MD for the patient is [name], MD  LOS - 1  Outpatient Specialists: none  Chief Complaint  Patient presents with  . Altered Mental Status       Brief Narrative   85 year old male with advanced dementia, chronic systolic CHF, paroxysmal A. fib (not considered a candidate for anticoagulation due to advanced age and failure to thrive), COPD, type 2 diabetes mellitus, hypertension, recent COVID-19 infection (hospitalized at [facility] from 10/15-10/21) presented from an outside facility with altered mental status and hypoxia with O2 sat of 84%, hypotensive with systolic blood pressure in the 80s.. Chemistry showed sodium of 156, creatinine of 1.95 troponin of 46.  Lactic acid of 2.6, procalcitonin of 0.23, fibrinogen 487 and D-dimer of 4400. CT angiogram of the chest showed bilateral acute PE with significant clot burden. Vascular surgery consulted and patient underwent thrombolysis in bilateral pulmonary artery with TPA.  Admitted to stepdown unit/ICU.    Subjective:   Patient on 4 L nasal cannula.  Poorly communicative   Assessment  & Plan :   Principal problem Acute respiratory failure with hypoxemia secondary to massive bilateral pulmonary embolism Underwent thrombolysis by vascular surgery.  Monitor in ICU.  Currently on 4 L via nasal cannula.  Was hypotensive this morning receiving Levophed.  Responding to IV normal saline bolus.  Continue IV heparin drip. Vascular surgery and pulmonary consult appreciated   Active Problems:  Hypernatremia Suspect secondary to dehydration.  Continue D5 water.  Monitor BMET this pm.  Strict I's/O. Add free water if tolerating p.o.  Acute kidney injury Suspect prerenal ATN.  Monitor with IV hydration.  Avoid nephrotoxins.  Strict I's/O.   Chronic systolic CHF  Patient hypovolemic requiring IV fluid and pressors.  Paroxysmal A. fib Rate controlled.  Not considered a candidate for anticoagulation due to advanced age, severe dementia and failure to thrive.  Currently on IV heparin for massive PE  Hypertension  Holding amlodipine   COPD Continue as needed bronchodilators  Failure to thrive Prognosis extremely guarded.  Palliative care consulted who discussed with daughters and they want full scope of treatment for now until the daughter will be here at 29 tomorrow to discuss with myself and the pulmonologist.     Code Status : Full code  Family Communication  : We will update daughter  Disposition Plan  : Continue ICU monitoring  Barriers For Discharge : Active symptoms  Consults  : Pulmonary, vascular  Procedures  : CT angiogram of the chest, TPA  DVT Prophylaxis  : IV heparin  Lab Results  Component Value Date   PLT 179 11/14/2018    Antibiotics  :   Anti-infectives (From admission, onward)   Start     Dose/Rate Route Frequency Ordered Stop   11/13/18 2129  ceFAZolin (ANCEF) IVPB 2g/100 mL premix     2 g 200 mL/hr over 30 Minutes Intravenous 30 min pre-op 11/13/18 2129 11/13/18 2225        Objective:   Vitals:   11/14/18 1300 11/14/18 1315 11/14/18 1430 11/14/18 1445  BP:  104/74 (!) 114/91 (!) 105/91  Pulse: 68 75 (!) 54 93  Resp: 17 17 14  (!) 38  Temp:      TempSrc:      SpO2: 99% 99% 100% 100%  Weight:      Height:        Wt Readings from Last 3 Encounters:  11/13/18 76.6 kg  10/20/18 85.6 kg  10/18/18 77.1 kg     Intake/Output Summary (Last 24 hours) at 11/14/2018 1532 Last data filed at 11/14/2018 1233 Gross per 24 hour   Intake 4816.74 ml  Output 550 ml  Net 4266.74 ml     Physical Exam  Gen: Frail elderly male, restless in bed, noncommunicative HEENT:  moist mucosa, supple neck Chest: Diminished bilateral breath sounds, no rhonchi or wheeze CVS: Normal S1-S2, no murmurs GI: soft, NT, ND, BS+ Musculoskeletal: warm, no edema     Data Review:    CBC Recent Labs  Lab 11/13/18 1457 11/14/18 0000 11/14/18 1207  WBC 9.1 10.7* 11.2*  HGB 11.7* 10.0* 9.4*  HCT 36.7* 30.9* 29.5*  PLT 213 188 179  MCV 108.9* 105.5* 107.7*  MCH 34.7* 34.1* 34.3*  MCHC 31.9 32.4 31.9  RDW 15.1 15.1 15.3  LYMPHSABS 1.3 1.2  --   MONOABS 0.8 0.8  --   EOSABS 0.2 0.2  --   BASOSABS 0.0 0.0  --     Chemistries  Recent Labs  Lab 11/13/18 1457 11/14/18 0000 11/14/18 1207  NA 156* 153* 155*  K 3.6 3.7 3.7  CL 121* 120* 124*  CO2 22 23 22   GLUCOSE 134* 117* 106*  BUN 61* 58* 53*  CREATININE 1.95* 1.75* 1.25*  CALCIUM 8.6* 7.9* 7.7*  MG  --  1.6*  --   AST 22  --  21  ALT 16  --  12  ALKPHOS 85  --  73  BILITOT 0.8  --  0.7   ------------------------------------------------------------------------------------------------------------------ Recent Labs    11/13/18 1457  TRIG 125    Lab Results  Component Value Date   HGBA1C 6.0 (H) 10/20/2018   ------------------------------------------------------------------------------------------------------------------ No results for input(s): TSH, T4TOTAL, T3FREE, THYROIDAB in the last 72 hours.  Invalid input(s): FREET3 ------------------------------------------------------------------------------------------------------------------ Recent Labs    11/13/18 1457  FERRITIN 287    Coagulation profile Recent Labs  Lab 11/13/18 1457  INR 1.2  No results for input(s): DDIMER in the last 72 hours.  Cardiac Enzymes No results for input(s): CKMB, TROPONINI, MYOGLOBIN in the last 168 hours.  Invalid input(s): CK  ------------------------------------------------------------------------------------------------------------------    Component Value Date/Time   BNP 31.0 11/14/2018 0000   BNP 51 11/08/2016 1656    Inpatient Medications  Scheduled Meds: . atorvastatin  20 mg Oral q1800  . Chlorhexidine Gluconate Cloth  6 each Topical Q0600  . gabapentin  100 mg Oral QHS  . haloperidol lactate      . insulin aspart  0-9 Units Subcutaneous Q4H  . pantoprazole  40 mg Oral Daily  . PARoxetine  30 mg Oral Daily  . primidone  100 mg Oral TID  . QUEtiapine  100 mg Oral QHS  . QUEtiapine  50 mg Oral BID  . traZODone  100 mg Oral QHS   Continuous Infusions: . dextrose 75 mL/hr at 11/14/18 1233  . heparin 1,000 Units/hr (11/14/18 1449)  . norepinephrine (LEVOPHED) Adult infusion 8 mcg/min (11/14/18 1233)  . phenylephrine (NEO-SYNEPHRINE) Adult infusion Stopped (11/14/18 0505)   PRN Meds:.fentaNYL (SUBLIMAZE) injection, polyethylene glycol  Micro Results Recent Results (from the past 240 hour(s))  SARS CORONAVIRUS 2 (TAT 6-24 HRS) Nasopharyngeal Nasopharyngeal Swab     Status: Abnormal   Collection Time: 11/13/18  2:57 PM   Specimen: Nasopharyngeal Swab  Result Value Ref Range Status   SARS Coronavirus 2 POSITIVE (A) NEGATIVE Final    Comment: RESULT CALLED TO, READ BACK BY AND VERIFIED WITH: M. CAMPBELL,RN 0146 11/14/2018 T. TYSOR (NOTE) SARS-CoV-2 target nucleic acids are DETECTED. The SARS-CoV-2 RNA is generally detectable in upper and lower respiratory specimens during the acute phase of infection. Positive results are indicative of active infection with SARS-CoV-2. Clinical  correlation with patient history and other diagnostic information is necessary to determine patient infection status. Positive results do  not rule out bacterial infection or co-infection with other viruses. The expected result is Negative. Fact Sheet for Patients: SugarRoll.be Fact Sheet  for Healthcare Providers: https://www.woods-mathews.com/ This test is not yet approved or cleared by the [place] FDA and  has been authorized for detection and/or diagnosis of SARS-CoV-2 by FDA under an Emergency Use Authorization (EUA). This EUA will remain  in effect (meaning this test can be used) f or the duration of the COVID-19 declaration under Section 564(b)(1) of the Act, 21 U.S.C. section 360bbb-3(b)(1), unless the authorization is terminated or revoked sooner. Performed at [facility], [place] [address] [place], [place] [address]   Culture, blood (Routine x 2)     Status: None (Preliminary result)   Collection Time: 11/13/18  2:59 PM   Specimen: BLOOD  Result Value Ref Range Status   Specimen Description BLOOD BLOOD RIGHT ARM  Final   Special Requests   Final    BOTTLES DRAWN AEROBIC AND ANAEROBIC Blood Culture adequate volume   Culture   Final    NO GROWTH < 24 HOURS Performed at [facility], [address] [place], [place] [address]    Report Status PENDING  Incomplete  Blood Culture (routine x 2)     Status: None (Preliminary result)   Collection Time: 11/13/18  3:00 PM   Specimen: BLOOD  Result Value Ref Range Status   Specimen Description BLOOD BLOOD LEFT ARM  Final   Special Requests   Final    BOTTLES DRAWN AEROBIC AND ANAEROBIC Blood Culture adequate volume   Culture   Final    NO GROWTH <  46 HOURS Performed at [facility], [address] [place], [place] [address]    Report Status PENDING  Incomplete    Radiology Reports Ct Head Wo Contrast  Result Date: 11/14/2018 CLINICAL DATA:  Encephalopathy EXAM: CT HEAD WITHOUT CONTRAST TECHNIQUE: Contiguous axial images were obtained from the base of the skull through the vertex without intravenous contrast. COMPARISON:  Head CT 06/06/2018 FINDINGS: Brain: There is no mass, hemorrhage or extra-axial collection. There is generalized atrophy without lobar predilection.  Hypodensity of the white matter is most commonly associated with chronic microvascular disease. Vascular: No abnormal hyperdensity of the major intracranial arteries or dural venous sinuses. No intracranial atherosclerosis. Skull: The visualized skull base, calvarium and extracranial soft tissues are normal. Sinuses/Orbits: No fluid levels or advanced mucosal thickening of the visualized paranasal sinuses. No mastoid or middle ear effusion. The orbits are normal. IMPRESSION: Generalized atrophy and chronic microvascular ischemia without acute intracranial abnormality. Electronically Signed   By: Ulyses Jarred M.D.   On: 11/14/2018 03:08   Ct Angio Chest Pe W And/or Wo Contrast  Result Date: 11/13/2018 CLINICAL DATA:  Hypoxia. Elevated D-dimer. The patient is COVID positive EXAM: CT ANGIOGRAPHY CHEST WITH CONTRAST TECHNIQUE: Multidetector CT imaging of the chest was performed using the standard protocol during bolus administration of intravenous contrast. Multiplanar CT image reconstructions and MIPs were obtained to evaluate the vascular anatomy. CONTRAST:  9mL OMNIPAQUE IOHEXOL 350 MG/ML SOLN COMPARISON:  CT dated June 06, 2018. FINDINGS: Cardiovascular: Contrast injection is sufficient to demonstrate satisfactory opacification of the pulmonary arteries to the segmental level.There are acute bilateral pulmonary emboli. For example there is an embolus in the main right pulmonary artery extending into the right, middle, and upper lobar branches. There are acute pulmonary emboli involving the left lower lobe pulmonary artery extending into segmental and subsegmental branches. The main pulmonary artery is within normal limits for size. There is no evidence for right-sided heart strain with the RV LV ratio measuring less than 0.9. There is some reflux into the IVC consistent with some underlying cardiac dysfunction. Atherosclerotic changes are noted throughout the thoracic aorta. Mediastinum/Nodes: --there is a 4.5  x 1.6 cm right upper paratracheal mass. This is essentially unchanged from prior CT in 2018 and is favored to represent a inferior right thyroid nodule/mass. --No axillary lymphadenopathy. --No supraclavicular lymphadenopathy. --Normal thyroid gland. --The esophagus is unremarkable Lungs/Pleura: There is scattered ground-glass airspace opacities bilaterally. There is no pneumothorax. There is no significant pleural effusion. The trachea is unremarkable. Upper Abdomen: There is cholelithiasis without CT evidence for acute cholecystitis. Musculoskeletal: There is an old healed fracture of the sternum. No new acute displaced fracture. Review of the MIP images confirms the above findings. IMPRESSION: 1. Acute bilateral pulmonary emboli as detailed above, greatest on the right. While there is no CT evidence for right heart strain, there is some reflux of contrast into the IVC consistent with some degree of right-sided heart dysfunction. 2. Bilateral ground-glass airspace opacities consistent with the patient's history of viral pneumonia. 3. There is cholelithiasis without secondary signs of acute cholecystitis. Aortic Atherosclerosis (ICD10-I70.0). These results were called by telephone at the time of interpretation on 11/13/2018 at 6:01 pm to provider [facility] , who verbally acknowledged these results. Electronically Signed   By: Constance Holster M.D.   On: 11/13/2018 18:05   Dg Chest Port 1 View  Result Date: 11/13/2018 CLINICAL DATA:  Shortness of breath.  COVID-19. EXAM: PORTABLE CHEST 1 VIEW COMPARISON:  10/23/2018 FINDINGS: The heart  size and mediastinal contours are within normal limits. Both lungs are clear. The visualized skeletal structures are unremarkable. IMPRESSION: No active disease. Electronically Signed   By: Lorriane Shire M.D.   On: 11/13/2018 15:32   Dg Chest Port 1 View  Result Date: 10/20/2018 CLINICAL DATA:  Shortness of breath.  COVID-19 virus infection. EXAM: PORTABLE CHEST 1  VIEW COMPARISON:  10/18/2018 FINDINGS: The heart size and mediastinal contours are within normal limits. Aortic atherosclerosis. Stable prominence of pulmonary interstitial markings. No evidence of pulmonary airspace disease or pleural effusion. The visualized skeletal structures are unremarkable. IMPRESSION: Stable exam.  No active disease. Electronically Signed   By: Marlaine Hind M.D.   On: 10/20/2018 12:12   Dg Chest Portable 1 View  Result Date: 10/18/2018 CLINICAL DATA:  COVID-19 positive. Diabetes. Hypertension. COPD. Bladder cancer. EXAM: PORTABLE CHEST 1 VIEW COMPARISON:  06/06/2018 FINDINGS: Two frontal views of the chest. Midline trachea. Borderline cardiomegaly. Atherosclerosis in the transverse aorta. No pleural effusion or pneumothorax. Moderate pulmonary interstitial thickening is not significantly changed. Bibasilar opacities, favored to represent scarring or subsegmental atelectasis, similar. IMPRESSION: Chronic interstitial thickening, likely related to the clinical history of COPD. No acute superimposed process. Aortic Atherosclerosis (ICD10-I70.0). Electronically Signed   By: Abigail Miyamoto M.D.   On: 10/18/2018 18:33   Dg Chest Port 1v Same Day  Result Date: 10/23/2018 CLINICAL DATA:  Shortness of breath. History of diabetes, COPD and bladder cancer. EXAM: PORTABLE CHEST 1 VIEW COMPARISON:  Radiographs 10/20/2018 and 10/18/2018. CT 06/06/2018. FINDINGS: 1148 hours. The heart size and mediastinal contours are stable. There is aortic atherosclerosis. There is stable mild interstitial and central airway thickening which appears chronic. No superimposed airspace disease, pleural effusion or pneumothorax. And old rib fractures noted on the right. IMPRESSION: 1. Stable chronic lung disease. No acute cardiopulmonary process. 2. Aortic atherosclerosis. Electronically Signed   By: Richardean Sale M.D.   On: 10/23/2018 12:13    Time Spent in minutes  35   Anna-Marie Coller M.D on 11/14/2018 at  3:32 PM  Between 7am to 7pm - Pager - [phone]  After 7pm go to [email or web address] - password [facility]  [facility] -  Office  [phone]

## 2018-11-14 NOTE — Consult Note (Addendum)
Consultation Note Date: 11/14/2018   Patient Name: Chase Simmons  DOB: Jan 09, 1933  MRN: [id number]  Age / Sex: 85 y.o., male  PCP: [name], MD Referring Physician: Louellen Molder, MD  Reason for Consultation: Establishing goals of care  HPI/Patient Profile: 85 y.o. male  with past medical history of dementia, chronic systolic heart failure, paroxysmal atrial fibrillation (not on anticoagulation), HTN, COPD, type 2 DM, recent covid admission at [facility] 10/15-10/21 admitted on 11/13/2018 with AMS and hypoxia. CT angiogram of chest showed bilateral acute PE with significant clot burden. Vascular surgery consulted and patient underwent thrombolysis in bilateral pulmonary artery with TPA. Hypotensive on levophed. Palliative medicine consultation for goals of care.   Clinical Assessment and Goals of Care:  I have reviewed medical records, discussed with care team during multidisciplinary rounds and assessed the patient at bedside. Chase Simmons is restless and agitated this afternoon. He is disoriented and will not follow commands. He is intermittently moaning. Recently given fentanyl without relief. RN instructed to give Haldol 2mg  IV x1 which did calm patient down during visit.   Daughter, Chase Simmons at bedside. Patient has three children Chase Simmons, Chase Simmons). There is not a documented HCPOA. Chase Simmons shares that her father never wanted to talk about those sort of decisions, always believing 'nothing would happen to him' and that he could make his own decisions.   I introduced Palliative Medicine as specialized medical care for people living with serious illness. It focuses on providing relief from the symptoms and stress of a serious illness. The goal is to improve quality of life for both the patient and the family.  We discussed a brief life review of the patient. Patient was living at home up until a  hospitalization in June 2020. Behavioral disturbance from dementia was worsening at home and medications were being adjusted outpatient. Since June, he has been living at [facility]. Family has only been able to see him from the window. Chase Simmons shares that the facility reports he has been eating but reports he looks as if he has lost weight (230lbs in June and now 76.6kg documented/168.5 lbs).   Chase Simmons is worried about her father. This is worst/most restless she has ever seen him.   Discussed course of hospitalization including diagnoses, interventions, plan of care. Educated on disease trajectory of dementia and concern that dementia has greatly progressed following multiple acute illnesses/hospitalizations since June and with underlying chronic conditions including heart failure. Reviewed labs/scans.  There have been two MOST forms completed this year. In June 2020, documentation for DNR/DNI, no feeding tube and comfort focused care signed by son.  In October 2020, new MOST form was completed by daughter Chase Simmons with wishes for FULL code/FULL scope treatment and feeding tube for time trial if indicated. Chase Simmons shares that her and siblings have had differing opinions on code status, especially since they have been unable to see him in person.   Chase Simmons shares that seeing her father like this today, changes her thoughts on code status. Chase Simmons also shares that  they have "hesitated to give up on him" in the past because they have always seen him "rebound" when doctors were giving up on him.   Explained the importance of having joint family meeting for further goals of care discussion and wishes for their father. Chase Simmons agrees.  Chase Simmons calls sister, Chase Simmons and puts on speaker phone during my visit. Introduced role of palliative medicine and reviewed course of hospitalization including diagnoses, interventions, and plan of care. Chase Simmons seems hesitant at the thought of palliative involvement. She also is requesting  to speak to a medical doctor instead of a nurse practitioner tomorrow during her visit. Reassured Chase Simmons that I would notify care team of plan to meet with tomorrow morning around 11am.     SUMMARY OF RECOMMENDATIONS    Continue FULL code/FULL scope treatment.  Patient does NOT have a documented HCPOA. His three children (Chase Simmons, Chase Simmons) make joint medical decisions.  Lift visitor restrictions tomorrow, 11/12 at 11am for joint family meeting to discuss goals of care. Daughter, Chase Simmons requesting a medical doctor to be present during meeting. Updated attending and critical care MD.  Code Status/Advance Care Planning:  Full code  Symptom Management:   Haldol 2mg  IV x1. Patient extremely agitated/restless during visit.  Palliative Prophylaxis:   Aspiration, Delirium Protocol, Frequent Pain Assessment, Oral Care and Turn Reposition  Additional Recommendations (Limitations, Scope, Preferences):  Full Scope Treatment  Psycho-social/Spiritual:   Desire for further Chaplaincy support: yes  Additional Recommendations: Caregiving  Support/Resources and Compassionate Wean Education  Prognosis:   Unable to determine: guarded  Discharge Planning: To Be Determined      Primary Diagnoses: Present on Admission: . Pulmonary embolism ([place]) . Acute massive pulmonary embolism ([place])   I have reviewed the medical record, interviewed the patient and family, and examined the patient. The following aspects are pertinent.  Past Medical History:  Diagnosis Date  . Allergy   . Bladder cancer ([place])    a. followed by Dr. Jacqlyn Larsen  . Chronic combined systolic (congestive) and diastolic (congestive) heart failure ([place])    a. 07/2016 Echo: EF 30-35%, sev mid-apicalanteroseptal, ant, inf HK, Gr1 DD.  Marland Kitchen COPD (chronic obstructive pulmonary disease) ([place])   . Hyperlipidemia   . Hypertension   . NICM (nonischemic cardiomyopathy) ([place])    a. 07/2016 Echo: EF 30-35%, sev mid-apicalanteroseptal,  ant, inf HK, Gr1 DD, mild MR, mildly dil LA, PASP 21mmHg - ? stress induced CM.  Marland Kitchen Nonobstructive Coronary atherosclerosis    a. 07/2016 Cath: LM nl, LAD nl, D1 40ost, LCX 8m, RCA nl, EF 25-30%.  Marland Kitchen PAF (paroxysmal atrial fibrillation) ([place])    a. Dx 08/2013. CHA2DS2VASc = 6-->no OAC 2/2 h/o falls and nocompliance.  . Tremor, essential    sees [facility] neurologist  . Type 2 diabetes mellitus with complication, without long-term current use of insulin ([place]) 08/01/2012  . Urinary incontinence    Social History   Socioeconomic History  . Marital status: Legally Separated    Spouse name: Not on file  . Number of children: 3  . Years of education: 71  . Highest education level: Not on file  Occupational History  . Occupation: Retired Investment banker, operational  . Financial resource strain: Not hard at all  . Food insecurity    Worry: Never true    Inability: Never true  . Transportation needs    Medical: No    Non-medical: No  Tobacco Use  . Smoking status: Former Smoker    Packs/day: 1.50  Years: 40.00    Pack years: 60.00    Quit date: 02/03/2003    Years since quitting: 15.7  . Smokeless tobacco: Never Used  Substance and Sexual Activity  . Alcohol use: Yes    Alcohol/week: 12.0 standard drinks    Types: 12 Standard drinks or equivalent per week    Frequency: Never    Comment: 1 drink with dinner  . Drug use: No  . Sexual activity: Never  Lifestyle  . Physical activity    Days per week: 0 days    Minutes per session: Not on file  . Stress: Not at all  Relationships  . Social Herbalist on phone: Not on file    Gets together: Not on file    Attends religious service: Not on file    Active member of club or organization: Not on file    Attends meetings of clubs or organizations: Not on file    Relationship status: Not on file  Other Topics Concern  . Not on file  Social History Narrative   Patient grew up in [place], [place]. He is separated from his wife since  25. They never divorced. Patient has 1 son and 2 daughters. 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   Family History  Problem Relation Age of Onset  . Arthritis Mother   . Dementia Mother   . Cancer Maternal Uncle        prostate  . Cancer Maternal Aunt        breast cancer   Scheduled Meds: . atorvastatin  20 mg Oral q1800  . Chlorhexidine Gluconate Cloth  6 each Topical Q0600  . gabapentin  100 mg Oral QHS  . haloperidol lactate      . insulin aspart  0-9 Units Subcutaneous Q4H  . pantoprazole  40 mg Oral Daily  . PARoxetine  30 mg Oral Daily  . primidone  100 mg Oral TID  . QUEtiapine  100 mg Oral QHS  . QUEtiapine  50 mg Oral BID  . traZODone  100 mg Oral QHS   Continuous Infusions: . dextrose 75 mL/hr at 11/14/18 1708  . heparin 1,000 Units/hr (11/14/18 1449)  . norepinephrine (LEVOPHED) Adult infusion 8 mcg/min (11/14/18 1708)  . phenylephrine (NEO-SYNEPHRINE) Adult infusion Stopped (11/14/18 0505)   PRN Meds:.fentaNYL (SUBLIMAZE) injection, polyethylene glycol Medications Prior to Admission:  Prior to Admission medications   Medication Sig Start Date End Date Taking? Authorizing Provider  acetaminophen (TYLENOL) 325 MG tablet Take 2 tablets (650 mg total) by mouth every 6 (six) hours as needed for mild pain (or Fever >/= 101). 06/22/15  Yes Gouru, Aruna, MD  albuterol (VENTOLIN HFA) 108 (90 Base) MCG/ACT inhaler Inhale 2 puffs into the lungs every 2 (two) hours as needed for wheezing.   Yes [provider]  amLODipine (NORVASC) 2.5 MG tablet Take 1 tablet (2.5 mg total) by mouth daily. 06/09/18  Yes Gladstone Lighter, MD  aspirin EC 81 MG tablet Take 81 mg by mouth daily.   Yes [provider]  atorvastatin (LIPITOR) 20 MG tablet Take 1 tablet (20 mg total) by mouth daily. 07/10/18  Yes Leone Haven, MD  gabapentin (NEURONTIN) 100 MG capsule Take 100 mg by mouth at bedtime.  05/22/18  Yes [provider]  glimepiride (AMARYL)  2 MG tablet Take 2 mg by mouth daily with breakfast. 09/29/18  Yes [provider]  lisinopril (PRINIVIL,ZESTRIL) 20 MG tablet Take 1 tablet (  20 mg total) by mouth daily. 06/15/17  Yes Leone Haven, MD  metFORMIN (GLUCOPHAGE) 500 MG tablet TAKE 1 TABLET BY MOUTH TWICE A DAY Patient taking differently: Take 500 mg by mouth 2 (two) times daily.  05/23/18  Yes Leone Haven, MD  pantoprazole (PROTONIX) 40 MG tablet Take 1 tablet (40 mg total) by mouth daily. 06/15/17  Yes Leone Haven, MD  PARoxetine (PAXIL) 30 MG tablet Take 30 mg by mouth daily. 10/09/18  Yes [provider]  polyethylene glycol (MIRALAX / GLYCOLAX) 17 g packet Take 17 g by mouth daily as needed for mild constipation. 06/08/18  Yes Gladstone Lighter, MD  primidone (MYSOLINE) 50 MG tablet Take 100 mg by mouth 3 (three) times daily.  09/29/18  Yes [provider]  QUEtiapine (SEROQUEL) 50 MG tablet Take 50 mg by mouth 2 (two) times daily.  10/06/18  Yes [provider]  QUEtiapine (SEROQUEL) 50 MG tablet Take 100 mg by mouth at bedtime.   Yes [provider]  tiotropium (SPIRIVA) 18 MCG inhalation capsule Place 18 mcg into inhaler and inhale as needed (wheezing).    Yes [provider]  traZODone (DESYREL) 50 MG tablet Take 100 mg by mouth at bedtime.  10/06/18  Yes [provider]  vitamin C (VITAMIN C) 500 MG tablet Take 1 tablet (500 mg total) by mouth daily. 10/25/18  Yes Ghimire, Henreitta Leber, MD  zinc sulfate 220 (50 Zn) MG capsule Take 1 capsule (220 mg total) by mouth daily. 10/25/18  Yes Ghimire, Henreitta Leber, MD  albuterol (PROVENTIL) (2.5 MG/3ML) 0.083% nebulizer solution Take 3 mLs (2.5 mg total) by nebulization every 2 (two) hours as needed for wheezing. Patient not taking: Reported on 11/13/2018 06/08/18   Gladstone Lighter, MD  insulin aspart (NOVOLOG) 100 UNIT/ML injection 0-9 Units, Subcutaneous, 3 times daily with mealS CBG < 70: Implement Hypoglycemia   CBG 70 - 120: 0 units CBG 121 - 150: 1 unit CBG 151 - 200: 2 units CBG 201 - 250: 3 units CBG 251 - 300: 5 units CBG 301 - 350: 7 units CBG 351 - 400: 9 units CBG > 400: call MD Patient not taking: Reported on 11/13/2018 10/24/18   Jonetta Osgood, MD  predniSONE (DELTASONE) 10 MG tablet Take 40 mg daily for 1 day, 30 mg daily for 1 day, 20 mg daily for 1 days,10 mg daily for 1 day, then stop Patient not taking: Reported on 11/13/2018 10/24/18   Jonetta Osgood, MD   Allergies  Allergen Reactions  . Latex Itching   Review of Systems  Unable to perform ROS: Acuity of condition   Physical Exam Vitals signs and nursing note reviewed.  Constitutional:      Appearance: He is cachectic. He is ill-appearing.  HENT:     Head: Normocephalic and atraumatic.  Cardiovascular:     Rate and Rhythm: Rhythm irregularly irregular.  Pulmonary:     Effort: No tachypnea, accessory muscle usage or respiratory distress.  Abdominal:     Tenderness: There is no abdominal tenderness.  Skin:    General: Skin is warm and dry.  Neurological:     Comments: Restless/agitated. Disoriented  Psychiatric:        Attention and Perception: He is inattentive.        Speech: He is noncommunicative.        Behavior: Behavior is agitated.        Cognition and Memory: Cognition is impaired.  Vital Signs: BP 119/72   Pulse 90   Temp 98.2 F (36.8 C) (Axillary)   Resp 16   Ht 5\' 9"  (1.753 m)   Wt 76.6 kg   SpO2 98%   BMI 24.94 kg/m  Pain Scale: CPOT POSS *See Group Information*: 2-Acceptable,Slightly drowsy, easily aroused     SpO2: SpO2: 98 % O2 Device:SpO2: 98 % O2 Flow Rate: .O2 Flow Rate (L/min): 2 L/min  IO: Intake/output summary:   Intake/Output Summary (Last 24 hours) at 11/14/2018 1709 Last data filed at 11/14/2018 1708 Gross per 24 hour  Intake 4306.69 ml  Output 550 ml  Net 3756.69 ml    LBM: Last BM Date: 11/13/18 Baseline Weight: Weight: 86.2 kg Most recent weight:  Weight: 76.6 kg     Palliative Assessment/Data: PPS 20%     Time In: 1400 Time Out: 1515 Time Total: 78min Greater than 50%  of this time was spent counseling and coordinating care related to the above assessment and plan.  Signed by:  Ihor Dow, DNP, FNP-C Palliative Medicine Team  Phone: [phone] Fax: [phone]   Please contact Palliative Medicine Team phone at [phone] for questions and concerns.  For individual provider: See Shea Evans

## 2018-11-14 NOTE — Progress Notes (Signed)
[place] for Heparin  Indication: pulmonary embolus  Allergies  Allergen Reactions  . Latex Itching    Patient Measurements: Height: 5\' 9"  (175.3 cm) Weight: 168 lb 14 oz (76.6 kg) IBW/kg (Calculated) : 70.7  Use TBW for heparin dosing.  Vital Signs: Temp: 98.4 F (36.9 C) (11/11 1215) Temp Source: Axillary (11/11 1215) BP: 104/74 (11/11 1315) Pulse Rate: 75 (11/11 1315)  Labs: Recent Labs    11/13/18 1457 11/14/18 0000 11/14/18 0402 11/14/18 1207  HGB 11.7* 10.0*  --  9.4*  HCT 36.7* 30.9*  --  29.5*  PLT 213 188  --  179  APTT 32  --   --   --   LABPROT 15.1  --   --   --   INR 1.2  --   --   --   HEPARINUNFRC  --   --  1.25* 1.05*  CREATININE 1.95* 1.75*  --  1.25*  TROPONINIHS 46*  --   --   --     Estimated Creatinine Clearance: 44.8 mL/min (A) (by C-G formula based on SCr of 1.25 mg/dL (H)).   Assessment: Pharmacy consulted to dose heparin in this 85 year old male admitted with PE.  No prior anticoagulation noted. H&H trending down, PLT WNL, baseline INR 1.2, aPTT 32s.  Heparin Course: 11/10 initiation: 6000 unit bolus, then 1500 units/hr 11/11 0402 HL 1.25: reduced to 1300 units/hr 11/11 1207 HL 1.05: hold for 1 hour and reduce rate to 1000 units/hr  Goal of Therapy:  Heparin level 0.3-0.7 units/ml Monitor platelets by anticoagulation protocol: Yes   Plan:  - Hold heparin x 1 hour then restart at 1000 units/hr - Recheck HL in 8 hours - Check CBC in AM  Raiford [name], PharmD Candidate 11/14/2018,1:31 PM

## 2018-11-14 NOTE — Care Plan (Signed)
After further assessment and discussion of patient's  status and medical condition during multidisciplinary rounds, the plan is outlined as below:   Agree with Palliative Care involvement, patient's overall long-term prognosis is very poor.   Analgesia and anxiolysis as needed   PCCM following peripherally    C. Derrill Kay, MD [facility]

## 2018-11-14 NOTE — Progress Notes (Signed)
[place] for Heparin  Indication: pulmonary embolus  Allergies  Allergen Reactions  . Latex Itching    Patient Measurements: Height: 5\' 9"  (175.3 cm) Weight: 168 lb 14 oz (76.6 kg) IBW/kg (Calculated) : 70.7 Heparin Dosing Weight: 86.2 kg   Vital Signs: Temp: 98.2 F (36.8 C) (11/11 0400) Temp Source: Axillary (11/11 0400) BP: 79/50 (11/11 0430) Pulse Rate: 74 (11/11 0430)  Labs: Recent Labs    11/13/18 1457 11/14/18 0000 11/14/18 0402  HGB 11.7* 10.0*  --   HCT 36.7* 30.9*  --   PLT 213 188  --   APTT 32  --   --   LABPROT 15.1  --   --   INR 1.2  --   --   HEPARINUNFRC  --   --  1.25*  CREATININE 1.95* 1.75*  --   TROPONINIHS 46*  --   --     Estimated Creatinine Clearance: 32 mL/min (A) (by C-G formula based on SCr of 1.75 mg/dL (H)).   Medical History: Past Medical History:  Diagnosis Date  . Allergy   . Bladder cancer ([place])    a. followed by Dr. Jacqlyn Larsen  . Chronic combined systolic (congestive) and diastolic (congestive) heart failure ([place])    a. 07/2016 Echo: EF 30-35%, sev mid-apicalanteroseptal, ant, inf HK, Gr1 DD.  Marland Kitchen COPD (chronic obstructive pulmonary disease) ([place])   . Hyperlipidemia   . Hypertension   . NICM (nonischemic cardiomyopathy) ([place])    a. 07/2016 Echo: EF 30-35%, sev mid-apicalanteroseptal, ant, inf HK, Gr1 DD, mild MR, mildly dil LA, PASP 30mmHg - ? stress induced CM.  Marland Kitchen Nonobstructive Coronary atherosclerosis    a. 07/2016 Cath: LM nl, LAD nl, D1 40ost, LCX 39m, RCA nl, EF 25-30%.  Marland Kitchen PAF (paroxysmal atrial fibrillation) ([place])    a. Dx 08/2013. CHA2DS2VASc = 6-->no OAC 2/2 h/o falls and nocompliance.  . Tremor, essential    sees [facility] neurologist  . Type 2 diabetes mellitus with complication, without long-term current use of insulin ([place]) 08/01/2012  . Urinary incontinence     Medications:  Medications Prior to Admission  Medication Sig Dispense Refill Last Dose  . acetaminophen (TYLENOL) 325 MG  tablet Take 2 tablets (650 mg total) by mouth every 6 (six) hours as needed for mild pain (or Fever >/= 101).   Unknown at PRN  . albuterol (VENTOLIN HFA) 108 (90 Base) MCG/ACT inhaler Inhale 2 puffs into the lungs every 2 (two) hours as needed for wheezing.   Unknown at PRN  . amLODipine (NORVASC) 2.5 MG tablet Take 1 tablet (2.5 mg total) by mouth daily.   11/13/2018 at 0900  . aspirin EC 81 MG tablet Take 81 mg by mouth daily.   11/13/2018 at 0900  . atorvastatin (LIPITOR) 20 MG tablet Take 1 tablet (20 mg total) by mouth daily. 90 tablet 3 11/12/2018 at 1800  . gabapentin (NEURONTIN) 100 MG capsule Take 100 mg by mouth at bedtime.    11/12/2018 at 2100  . glimepiride (AMARYL) 2 MG tablet Take 2 mg by mouth daily with breakfast.   11/13/2018 at 0900  . lisinopril (PRINIVIL,ZESTRIL) 20 MG tablet Take 1 tablet (20 mg total) by mouth daily. 90 tablet 1 11/13/2018 at 0900  . metFORMIN (GLUCOPHAGE) 500 MG tablet TAKE 1 TABLET BY MOUTH TWICE A DAY (Patient taking differently: Take 500 mg by mouth 2 (two) times daily. ) 180 tablet 2 11/13/2018 at 0730  . pantoprazole (PROTONIX) 40 MG  tablet Take 1 tablet (40 mg total) by mouth daily. 90 tablet 1 11/13/2018 at 0900  . PARoxetine (PAXIL) 30 MG tablet Take 30 mg by mouth daily.   11/13/2018 at 0900  . polyethylene glycol (MIRALAX / GLYCOLAX) 17 g packet Take 17 g by mouth daily as needed for mild constipation. 14 each 0 Unknown at PRN  . primidone (MYSOLINE) 50 MG tablet Take 100 mg by mouth 3 (three) times daily.    11/13/2018 at 0600  . QUEtiapine (SEROQUEL) 50 MG tablet Take 50 mg by mouth 2 (two) times daily.    11/13/2018 at 0600  . QUEtiapine (SEROQUEL) 50 MG tablet Take 100 mg by mouth at bedtime.   11/12/2018 at 2100  . tiotropium (SPIRIVA) 18 MCG inhalation capsule Place 18 mcg into inhaler and inhale as needed (wheezing).    Unknown at PRN  . traZODone (DESYREL) 50 MG tablet Take 100 mg by mouth at bedtime.    11/12/2018 at 2100  . vitamin C (VITAMIN  C) 500 MG tablet Take 1 tablet (500 mg total) by mouth daily.   11/13/2018 at 0900   . zinc sulfate 220 (50 Zn) MG capsule Take 1 capsule (220 mg total) by mouth daily.   11/13/2018 at 0900  . albuterol (PROVENTIL) (2.5 MG/3ML) 0.083% nebulizer solution Take 3 mLs (2.5 mg total) by nebulization every 2 (two) hours as needed for wheezing. (Patient not taking: Reported on 11/13/2018) 75 mL 12 Not Taking at Unknown time  . insulin aspart (NOVOLOG) 100 UNIT/ML injection 0-9 Units, Subcutaneous, 3 times daily with mealS CBG < 70: Implement Hypoglycemia  CBG 70 - 120: 0 units CBG 121 - 150: 1 unit CBG 151 - 200: 2 units CBG 201 - 250: 3 units CBG 251 - 300: 5 units CBG 301 - 350: 7 units CBG 351 - 400: 9 units CBG > 400: call MD (Patient not taking: Reported on 11/13/2018) 10 mL 11 Not Taking at Unknown time  . predniSONE (DELTASONE) 10 MG tablet Take 40 mg daily for 1 day, 30 mg daily for 1 day, 20 mg daily for 1 days,10 mg daily for 1 day, then stop (Patient not taking: Reported on 11/13/2018) 10 tablet 0 Not Taking at Unknown time    Assessment: Pharmacy consulted to dose heparin in this 85 year old male admitted with PE.  No prior anticoag noted.  CrCl = 32.4 ml/min  11/11 @ 0402 HL = 1.25, supratherapeutic, will hold heparin x 1 hour then reduce rate to 1300 units/hr, recheck HL 6 hours after heparin resumed.  Goal of Therapy:  Heparin level 0.3-0.7 units/ml Monitor platelets by anticoagulation protocol: Yes   Plan:  Hold heparin x 1 hour then restart at 1300 units/hr Recheck HL in 6 hours  [place] Crane, Eugen Jeansonne A 11/14/2018,4:44 AM

## 2018-11-14 NOTE — Progress Notes (Signed)
[facility] Daily Progress Note   Subjective: 1 Day Post-Op:             1.  Contrast injection right heart             2.  Thrombolysis with 4 mg of TPA in the right pulmonary arteries and 2 mg of TPA in the left lower lobe pulmonary artery              3.  Mechanical thrombectomy to the left lower lobe pulmonary artery, and the right upper, middle, and lower lobe pulmonary arteries             4.  Selective catheter placement right upper, middle, and lower lobe pulmonary arteries             5.  Selective catheter placement left lower lobe pulmonary artery  Patient restless in bed. No issues overnight. Weaned from [facility] to nasal cannula. Patient removed PAD.   Objective: Vitals:   11/14/18 1100 11/14/18 1115 11/14/18 1130 11/14/18 1145  BP: 101/72 130/71 129/79 (!) 132/106  Pulse: (!) 45 86 (!) 127 88  Resp: 19 (!) 22 15 (!) 22  Temp:      TempSrc:      SpO2: 100% 100% 100% 100%  Weight:      Height:        Intake/Output Summary (Last 24 hours) at 11/14/2018 1157 Last data filed at 11/14/2018 0818 Gross per 24 hour  Intake 4410.42 ml  Output 350 ml  Net 4060.42 ml   Physical Exam: A&Ox1, NAD CV: RRR Pulmonary: CTA Bilaterally, Non-labored Abdomen: Soft, Non-tender, Non-distended Right Groin: PAD removed by patient. Soft, no drainage, no swelling Vascular:   Bilateral Lower Extremity: soft, non-tender, minimal edema   Laboratory: CBC    Component Value Date/Time   WBC 10.7 (H) 11/14/2018 0000   HGB 10.0 (L) 11/14/2018 0000   HGB 13.7 01/08/2014 1611   HCT 30.9 (L) 11/14/2018 0000   HCT 36.1 (L) 04/20/2018 1054   PLT 188 11/14/2018 0000   PLT 165 01/08/2014 1611   BMET    Component Value Date/Time   NA 153 (H) 11/14/2018 0000   NA 137 01/08/2014 1611   K 3.7 11/14/2018 0000   K 4.4 01/08/2014 1611   CL 120 (H) 11/14/2018 0000   CL 102 01/08/2014 1611   CO2 23 11/14/2018 0000   CO2 26 01/08/2014 1611   GLUCOSE 117 (H) 11/14/2018  0000   GLUCOSE 172 (H) 01/08/2014 1611   BUN 58 (H) 11/14/2018 0000   BUN 18 01/08/2014 1611   CREATININE 1.75 (H) 11/14/2018 0000   CREATININE 1.13 01/08/2014 1611   CALCIUM 7.9 (L) 11/14/2018 0000   CALCIUM 8.4 (L) 01/08/2014 1611   GFRNONAA 35 (L) 11/14/2018 0000   GFRNONAA >60 01/08/2014 1611   GFRNONAA >60 12/15/2011 1257   GFRAA 41 (L) 11/14/2018 0000   GFRAA >60 01/08/2014 1611   GFRAA >60 12/15/2011 1257   Assessment/Planning: The patient is a 85 year old male with multiple medical issues including recent diagnosis of PE - s/p thrombolysis / thrombectomy - POD#1 1) able to wean from [facility] to [facility] 2) continue heparin and transition to Eliquis tomorrow. 3) understand the patient was deemed not candidate for anticoagulation at discharge from [facility] however patient now with DVT / PE 4) if patient doesn't tolerated anticoagulation would consider placing a filter   Discussed with Dr. Ellis Parents [facility] PA-C 11/14/2018 11:57  AM

## 2018-11-15 DIAGNOSIS — F0391 Unspecified dementia with behavioral disturbance: Secondary | ICD-10-CM

## 2018-11-15 DIAGNOSIS — R410 Disorientation, unspecified: Secondary | ICD-10-CM

## 2018-11-15 DIAGNOSIS — R627 Adult failure to thrive: Secondary | ICD-10-CM

## 2018-11-15 DIAGNOSIS — R451 Restlessness and agitation: Secondary | ICD-10-CM

## 2018-11-15 LAB — BASIC METABOLIC PANEL
Anion gap: 5 (ref 5–15)
BUN: 36 mg/dL — ABNORMAL HIGH (ref 8–23)
CO2: 22 mmol/L (ref 22–32)
Calcium: 7.7 mg/dL — ABNORMAL LOW (ref 8.9–10.3)
Chloride: 124 mmol/L — ABNORMAL HIGH (ref 98–111)
Creatinine, Ser: 0.85 mg/dL (ref 0.61–1.24)
GFR calc Af Amer: >60 mL/min (ref >60)
GFR calc non Af Amer: >60 mL/min (ref >60)
Glucose, Bld: 103 mg/dL — ABNORMAL HIGH (ref 70–99)
Potassium: 3.5 mmol/L (ref 3.5–5.1)
Sodium: 151 mmol/L — ABNORMAL HIGH (ref 135–145)

## 2018-11-15 LAB — CBC
HCT: 26 % — ABNORMAL LOW (ref 39.0–52.0)
Hemoglobin: 8.6 g/dL — ABNORMAL LOW (ref 13.0–17.0)
MCH: 34.3 pg — ABNORMAL HIGH (ref 26.0–34.0)
MCHC: 33.1 g/dL (ref 30.0–36.0)
MCV: 103.6 fL — ABNORMAL HIGH (ref 80.0–100.0)
Platelets: 181 10*3/uL (ref 150–400)
RBC: 2.51 MIL/uL — ABNORMAL LOW (ref 4.22–5.81)
RDW: 14.8 % (ref 11.5–15.5)
WBC: 9.6 10*3/uL (ref 4.0–10.5)
nRBC: 0 % (ref 0.0–0.2)

## 2018-11-15 LAB — URINE CULTURE

## 2018-11-15 LAB — PROCALCITONIN: Procalcitonin: <0.1 ng/mL

## 2018-11-15 LAB — GLUCOSE, CAPILLARY
Glucose-Capillary: 38 mg/dL — CL (ref 70–99)
Glucose-Capillary: 146 mg/dL — ABNORMAL HIGH (ref 70–99)
Glucose-Capillary: 104 mg/dL — ABNORMAL HIGH (ref 70–99)
Glucose-Capillary: 146 mg/dL — ABNORMAL HIGH (ref 70–99)
Glucose-Capillary: 115 mg/dL — ABNORMAL HIGH (ref 70–99)
Glucose-Capillary: 107 mg/dL — ABNORMAL HIGH (ref 70–99)
Glucose-Capillary: 122 mg/dL — ABNORMAL HIGH (ref 70–99)

## 2018-11-15 LAB — TROPONIN I (HIGH SENSITIVITY): Troponin I (High Sensitivity): 50 ng/L — ABNORMAL HIGH

## 2018-11-15 LAB — MAGNESIUM: Magnesium: 1.5 mg/dL — ABNORMAL LOW (ref 1.7–2.4)

## 2018-11-15 LAB — HEPARIN LEVEL (UNFRACTIONATED)
Heparin Unfractionated: 0.55 [IU]/mL (ref 0.30–0.70)
Heparin Unfractionated: 0.37 [IU]/mL (ref 0.30–0.70)

## 2018-11-15 MED ORDER — MAGNESIUM SULFATE 2 GM/50ML IV SOLN
2.0000 g | Freq: Once | INTRAVENOUS | Status: AC
  Administered 2018-11-15: 2 g via INTRAVENOUS
  Filled 2018-11-15: qty 50

## 2018-11-15 MED ORDER — HALOPERIDOL LACTATE 5 MG/ML IJ SOLN
INTRAMUSCULAR | Status: AC
  Filled 2018-11-15: qty 1

## 2018-11-15 MED ORDER — LORAZEPAM 2 MG/ML IJ SOLN
2.0000 mg | Freq: Once | INTRAMUSCULAR | Status: AC
  Administered 2018-11-15: 2 mg via INTRAVENOUS
  Filled 2018-11-15: qty 1

## 2018-11-15 MED ORDER — HALOPERIDOL LACTATE 5 MG/ML IJ SOLN
2.0000 mg | Freq: Four times a day (QID) | INTRAMUSCULAR | Status: DC | PRN
  Administered 2018-11-15 – 2018-11-21 (×5): 2 mg via INTRAVENOUS
  Filled 2018-11-15 (×4): qty 1

## 2018-11-15 MED ORDER — DEXMEDETOMIDINE HCL IN NACL 400 MCG/100ML IV SOLN
0.4000 ug/kg/h | INTRAVENOUS | Status: DC
  Administered 2018-11-15: 06:00:00 0.8 ug/kg/h via INTRAVENOUS

## 2018-11-15 MED ORDER — DOPAMINE-DEXTROSE 3.2-5 MG/ML-% IV SOLN
INTRAVENOUS | Status: AC
  Filled 2018-11-15: qty 250

## 2018-11-15 MED ORDER — DOPAMINE-DEXTROSE 3.2-5 MG/ML-% IV SOLN
0.0000 ug/kg/min | INTRAVENOUS | Status: DC
  Administered 2018-11-15: 5 ug/kg/min via INTRAVENOUS

## 2018-11-15 MED ORDER — LORAZEPAM 2 MG/ML IJ SOLN
1.0000 mg | INTRAMUSCULAR | Status: DC | PRN
  Administered 2018-11-15 – 2018-11-16 (×6): 2 mg via INTRAVENOUS
  Filled 2018-11-15 (×6): qty 1

## 2018-11-15 NOTE — Progress Notes (Signed)
[facility] for Heparin  Indication: pulmonary embolus  Allergies  Allergen Reactions  . Latex Itching    Patient Measurements: Height: 5\' 9"  (175.3 cm) Weight: 168 lb 14 oz (76.6 kg) IBW/kg (Calculated) : 70.7  Use TBW for heparin dosing.  Vital Signs: Temp: 98.4 F (36.9 C) (11/12 1600) Temp Source: Axillary (11/12 1600) BP: 133/96 (11/12 1800) Pulse Rate: 80 (11/12 1800)  Labs: Recent Labs    11/13/18 1457 11/14/18 0000  11/14/18 1207 11/14/18 2309 11/15/18 0607 11/15/18 0927 11/15/18 1837  HGB 11.7* 10.0*  --  9.4*  --  8.6*  --   --   HCT 36.7* 30.9*  --  29.5*  --  26.0*  --   --   PLT 213 188  --  179  --  181  --   --   APTT 32  --   --   --   --   --   --   --   LABPROT 15.1  --   --   --   --   --   --   --   INR 1.2  --   --   --   --   --   --   --   HEPARINUNFRC  --   --    < > 1.05* 0.74*  --  0.55 0.37  CREATININE 1.95* 1.75*  --  1.25*  --  0.85  --   --   TROPONINIHS 46*  --   --   --   --  50*  --   --    < > = values in this interval not displayed.    Estimated Creatinine Clearance: 65.8 mL/min (by C-G formula based on SCr of 0.85 mg/dL).   Assessment: Pharmacy consulted to dose heparin in this 85 year old male admitted with PE.  No prior anticoagulation noted. H&H trending down, PLT WNL, baseline INR 1.2, aPTT 32s.  Heparin Course: 11/10 initiation: 6000 unit bolus, then 1500 units/hr 11/11 0402 HL 1.25: reduced to 1300 units/hr 11/11 1207 HL 1.05: hold for 1 hour and reduce rate to 1000 units/hr 11/11 2309 HL 0.74, reduce rate to 900 units/hr 11/12 0927 HL 0.55, therapeutic x 1 11/12 1837 HL 0.37, therapeutic x 2  Goal of Therapy:  Heparin level 0.3-0.7 units/ml Monitor platelets by anticoagulation protocol: Yes   Plan:  - Continue heparin infusion at 900 units/hr - Recheck HL/CBC in AM  Lu Duffel, PharmD, BCPS Clinical Pharmacist 11/15/2018 7:23 PM

## 2018-11-15 NOTE — Consult Note (Signed)
  Psychiatry was consulted for recommendations regarding agitation.  Patient unable to participate in interview, patient is 85 year old man with a significant history of dementia and delirium currently in the hospital for PE.  Due to patient's clinical state he is unable to take p.o. medications, contributing to his agitation.  Patient had formally taking antipsychotic medications but is now unable.  Psychiatry was consulted to assist in the medical management of agitation.   Chart review shows patient has been taking Seroquel 100 mg nightly as well as 50 mg twice daily for a total daily dose of 200 mg  Will start patient on IV 2 mg Haldol p.o. as needed agitation.  Lorazepam 1-2mg  IV q4hrs anxiety.

## 2018-11-15 NOTE — Progress Notes (Signed)
[facility] for Heparin  Indication: pulmonary embolus  Allergies  Allergen Reactions  . Latex Itching    Patient Measurements: Height: 5\' 9"  (175.3 cm) Weight: 168 lb 14 oz (76.6 kg) IBW/kg (Calculated) : 70.7  Use TBW for heparin dosing.  Vital Signs: Temp: 99.1 F (37.3 C) (11/11 2000) Temp Source: Axillary (11/11 2000) BP: 134/60 (11/11 2300) Pulse Rate: 57 (11/11 2300)  Labs: Recent Labs    11/13/18 1457 11/14/18 0000 11/14/18 0402 11/14/18 1207 11/14/18 2309  HGB 11.7* 10.0*  --  9.4*  --   HCT 36.7* 30.9*  --  29.5*  --   PLT 213 188  --  179  --   APTT 32  --   --   --   --   LABPROT 15.1  --   --   --   --   INR 1.2  --   --   --   --   HEPARINUNFRC  --   --  1.25* 1.05* 0.74*  CREATININE 1.95* 1.75*  --  1.25*  --   TROPONINIHS 46*  --   --   --   --     Estimated Creatinine Clearance: 44.8 mL/min (A) (by C-G formula based on SCr of 1.25 mg/dL (H)).   Assessment: Pharmacy consulted to dose heparin in this 85 year old male admitted with PE.  No prior anticoagulation noted. H&H trending down, PLT WNL, baseline INR 1.2, aPTT 32s.  Heparin Course: 11/10 initiation: 6000 unit bolus, then 1500 units/hr 11/11 0402 HL 1.25: reduced to 1300 units/hr 11/11 1207 HL 1.05: hold for 1 hour and reduce rate to 1000 units/hr 11/11 2309 HL 0.74, reduce rate to 900 units/hr  Goal of Therapy:  Heparin level 0.3-0.7 units/ml Monitor platelets by anticoagulation protocol: Yes   Plan:  - Reduce Heparin infusion to 900 units/hr - Recheck HL in 8 hours - Check CBC in AM  Ena Dawley, PharmD 11/15/2018,12:53 AM

## 2018-11-15 NOTE — Progress Notes (Signed)
Pt continues to have worsening agitation/delirium despite prn ativan and prn fentanyl for pain management.  Low dose precedex gtt initiated and pt rested comfortably, however he became bradycardic.  Precedex gtt stopped and pt immediately became severely agitated.  Dopamine gtt started in an attempt to increase heart rate to restart precedex gtt.  However, despite dopamine gtt he developed intermittent bradycardia with frequent PCV's.  Precedex and dopamine gtts stopped, map 63 with levophed gtt infusing.  Will consult psychiatry to assist with management of delirium in this pt with advanced dementia who cannot safely take his outpatient antipsychotic medications.  Will continue to monitor and assess pt  Marda Stalker, [place] Pager [phone] (please enter 7 digits) [place] Pager [phone] (please enter 7 digits)

## 2018-11-15 NOTE — Progress Notes (Signed)
Inpatient Diabetes Program Recommendations  AACE/ADA: New Consensus Statement on Inpatient Glycemic Control (2015)  Target Ranges:  Prepandial:   less than 140 mg/dL      Peak postprandial:   less than 180 mg/dL (1-2 hours)      Critically ill patients:  140 - 180 mg/dL   Lab Results  Component Value Date   GLUCAP 104 (H) 11/15/2018   HGBA1C 6.0 (H) 10/20/2018    Review of Glycemic Control Results for Chase Simmons, Chase Simmons (MRN [id number]) as of 11/15/2018 09:38  Ref. Range 11/14/2018 16:04 11/14/2018 19:10 11/14/2018 19:14 11/14/2018 19:32 11/14/2018 23:38 11/15/2018 03:33 11/15/2018 07:15  Glucose-Capillary Latest Ref Range: 70 - 99 mg/dL 163 (H) 38 (LL) 33 (LL) 116 (H) 119 (H) 146 (H) 104 (H)   Current orders for Inpatient glycemic control: Novolog 0-9 units Q4 hours  Inpatient Diabetes Program Recommendations:    Pt had hypoglycemia 33 yesterday after 2 units of Novolog for glucose 160's.   Consider  Reducing Novolog Correction to Custom starting at 150 mg/dl.   -Custom Novolog correction scale 0-5 units       151-200  1 unit      201-250  2 units      251-300  3 units      301-350  4 units      351-400  5 units   Would not aggressively treat glucose levels at this time.  Thanks,  Tama Headings RN, MSN, BC-ADM Inpatient Diabetes Coordinator Team Pager [phone] (8a-5p)

## 2018-11-15 NOTE — Progress Notes (Signed)
Daily Progress Note   Patient Name: Chase Simmons       Date: 11/15/2018 DOB: [birth year]  Age: 85 y.o. MRN#: [id number] Attending Physician: Louellen Molder, MD Primary Care Physician: [name], MD Admit Date: 11/13/2018  Reason for Consultation/Follow-up: Establishing goals of care  Subjective: Patient remains agitated/restless this afternoon. Disoriented and does not participate in discussion.   GOC:   Extensive GOC discussion with Dr. Clementeen Graham at bedside. Patient's three children are present, [name], [name], and Woodruff. Patient does NOT have a documented decision-maker. Patient's children make joint medical decisions for him. Dr. Clementeen Graham reviewed in detail course of hospitalization including diagnoses, interventions, plan of care. Dementia disease trajectory was discussed.  Extensive time spent discussing code status and medical recommendation against heroic measures at end-of-life including resuscitation/life support machine. Children have differing opinions of if Rebecca should be resuscitated. After detailed discussion and review of MOST form, children agree with DNR/DNI in the event of cardiac arrest. Otherwise they wish to continue FULL scope treatment and very optimistic that he will show improvement with ongoing aggressive medical management.   MOST form completed with three children. Decision made for DNR/DNI, limited additional interventions including CPAP/BiPAP if indicated with respiratory distress, IVF/ABX if indicated, and YES to short-term feeding tube if indicated. Children are very concerned about his nutritional status and will want feeding tube placed in the next few days if he does not show improvement. Copies of MOST form placed in chart and given to all three  children.   Most importantly, children do NOT want patient to return to SNF following hospitalization. They are willing to do everything possible to take him home and care for him. They are interested in speaking with social worker about possible PACE options.  Discussed psych consult for medical management. Pending.   Answered questions and concerns to the best of my ability. Reassured of ongoing palliative support. PMT contact information given.    Length of Stay: 2  Current Medications: Scheduled Meds:  . atorvastatin  20 mg Oral q1800  . Chlorhexidine Gluconate Cloth  6 each Topical Q0600  . gabapentin  100 mg Oral QHS  . insulin aspart  0-9 Units Subcutaneous Q4H  . pantoprazole  40 mg Oral Daily  . PARoxetine  30 mg Oral Daily  . primidone  100 mg Oral  TID  . QUEtiapine  100 mg Oral QHS  . QUEtiapine  50 mg Oral BID  . traZODone  100 mg Oral QHS    Continuous Infusions: . dexmedetomidine (PRECEDEX) IV infusion Stopped (11/15/18 0626)  . dextrose 20 mL/hr at 11/15/18 0820  . DOPamine Stopped (11/15/18 0631)  . heparin 900 Units/hr (11/15/18 0820)  . magnesium sulfate bolus IVPB    . norepinephrine (LEVOPHED) Adult infusion 3 mcg/min (11/15/18 0820)    PRN Meds: fentaNYL (SUBLIMAZE) injection, LORazepam, polyethylene glycol  Physical Exam Vitals signs and nursing note reviewed.  Constitutional:      Appearance: He is cachectic. He is ill-appearing.  HENT:     Head: Normocephalic and atraumatic.  Cardiovascular:     Rate and Rhythm: Rhythm irregularly irregular.  Pulmonary:     Effort: No tachypnea, accessory muscle usage or respiratory distress.  Skin:    General: Skin is warm and dry.     Coloration: Skin is pale.  Neurological:     Mental Status: He is easily aroused.     Comments: Agitated/restless. disoriented            Vital Signs: BP (!) 143/81   Pulse 81   Temp 98 F (36.7 C) (Axillary)   Resp 20   Ht 5\' 9"  (1.753 m)   Wt 76.6 kg   SpO2 100%    BMI 24.94 kg/m  SpO2: SpO2: 100 % O2 Device: O2 Device: Room Air O2 Flow Rate: O2 Flow Rate (L/min): 2 L/min  Intake/output summary:   Intake/Output Summary (Last 24 hours) at 11/15/2018 1250 Last data filed at 11/15/2018 0820 Gross per 24 hour  Intake 1452.33 ml  Output 1000 ml  Net 452.33 ml   LBM: Last BM Date: 11/15/18 Baseline Weight: Weight: 86.2 kg Most recent weight: Weight: 76.6 kg       Palliative Assessment/Data: PPS 20%      Patient Active Problem List   Diagnosis Date Noted  . Pressure injury of skin 11/14/2018  . Palliative care by specialist   . Hypotension   . Agitation   . Dementia with behavioral disturbance ([place])   . Pulmonary embolism ([place]) 11/13/2018  . Acute massive pulmonary embolism ([place]) 11/13/2018  . COVID-19 10/19/2018  . COVID-19 virus infection 10/18/2018  . Weakness 06/07/2018  . Skin tear of elbow without complication [id number]  . Lactic acidosis 06/06/2018  . Depression 04/20/2018  . Altered mental status   . Frequency of urination 03/01/2018  . Confusion 03/01/2018  . Bilateral leg edema 11/08/2016  . Vertigo 08/25/2016  . Nausea 08/01/2016  . CHF (congestive heart failure) ([place]) 08/01/2016  . Atypical chest pain 07/20/2016  . COPD (chronic obstructive pulmonary disease) ([place]) 03/15/2016  . Coronary atherosclerosis of native coronary artery 01/05/2016  . Aortic atherosclerosis ([place]) 01/05/2016  . Bradycardia 01/05/2016  . Anxiety 06/17/2015  . Malignant neoplasm of urinary bladder ([place]) 06/17/2015  . Allergic rhinitis 05/14/2015  . Knee pain, bilateral 03/16/2015  . PAF (paroxysmal atrial fibrillation) ([place]) 08/30/2013  . Acid reflux 08/30/2013  . Goals of care, counseling/discussion 03/04/2013  . Tremor, essential 10/22/2012  . Hyperlipidemia 10/22/2012  . HTN (hypertension) 08/01/2012  . Type 2 diabetes mellitus with complication, without long-term current use of insulin ([place]) 08/01/2012  . Benign prostatic  hyperplasia with urinary obstruction 09/02/2011    Palliative Care Assessment & Plan   Patient Profile: 85 y.o. male  with past medical history of dementia, chronic systolic heart failure, paroxysmal atrial fibrillation (not on  anticoagulation), HTN, COPD, type 2 DM, recent covid admission at [facility] 10/15-10/21 admitted on 11/13/2018 with AMS and hypoxia. CT angiogram of chest showed bilateral acute PE with significant clot burden. Vascular surgery consulted and patient underwent thrombolysis in bilateral pulmonary artery with TPA. Hypotensive on levophed. Palliative medicine consultation for goals of care.   Assessment: Acute respiratory failure with hypoxemia Massive bilateral pulmonary embolism s/p thrombolysis AKI Hypernatremia Dementia with severe delirium Chronic systolic CHF Paroxysmal atrial fibrillation COPD Adult failure to thrive  Recommendations/Plan:  Extensive time spent discussing [place] with patient's three children and Dr. Clementeen Graham.  No documented HCPOA. Patient's children make joint decisions. [name], [name])  DNR/DNI. Otherwise FULL scope treatment. Children wish for all offered and available medical interventions to prolong life including feeding tube placement if necessary. Children feel he needs more time and hopeful for improvement.   MOST form completed with adult children. DNR/DNI, limited additional interventions including CPAP/BiPAP if indicated, IVF/ABX if indicated, and YES to feeding tube for time trial.   Most importantly, children do NOT want him to return to SNF but desire to take him home following hospitalization.   Psych consult for medication management.  Ongoing palliative discussions pending clinical course. High risk for decline.  Goals of Care and Additional Recommendations:  Limitations on Scope of Treatment: Full Scope Treatment  Code Status: DNR/DNI   Code Status Orders  (From admission, onward)         Start     Ordered    11/15/18 1248  Do not attempt resuscitation (DNR)  Continuous    Question Answer Comment  In the event of cardiac or respiratory ARREST Do not call a "code blue"   In the event of cardiac or respiratory ARREST Do not perform Intubation, CPR, defibrillation or ACLS   In the event of cardiac or respiratory ARREST Use medication by any route, position, wound care, and other measures to relive pain and suffering. May use oxygen, suction and manual treatment of airway obstruction as needed for comfort.      11/15/18 1247        Code Status History    Date Active Date Inactive Code Status Order ID Comments User Context   11/13/2018 2001 11/15/2018 1247 Full Code [id number]  Orene Desanctis, DO ED   10/20/2018 0606 10/24/2018 1353 DNR [id number]  Phillips Grout, MD Inpatient   10/19/2018 0319 10/20/2018 0551 DNR [id number]  Lance Coon, MD ED   06/06/2018 2015 06/13/2018 1644 DNR [id number]  Hillary Bow, MD ED   08/25/2016 0304 08/26/2016 2304 Full Code [id number]  Harrie Foreman, MD Inpatient   07/21/2016 0049 07/25/2016 2010 Full Code [id number]  Hugelmeyer, [place], DO Inpatient   07/10/2016 0934 07/13/2016 1719 Full Code [id number]  Saundra Shelling, MD Inpatient   06/19/2015 0021 06/23/2015 0047 Full Code [id number]  Alesia Richards, MD ED   06/04/2015 2013 06/08/2015 1833 Full Code [id number]  Loletha Grayer, MD ED   Advance Care Planning Activity    Advance Directive Documentation     Most Recent Value  Type of Advance Directive  Out of facility DNR (pink MOST or yellow form)  Pre-existing out of facility DNR order (yellow form or pink MOST form)  -  "MOST" Form in Place?  -       Prognosis:  Guarded  Discharge Planning:  To Be Determined  Care plan was discussed with children [name], [name]), Dr. Clementeen Graham, RN, SW  Thank you for allowing the Palliative Medicine Team to  assist in the care of this patient.   Time In: 1100- Time Out: 1300 Total Time 120 Prolonged Time Billed  yes       Greater than 50%  of this time was spent counseling and coordinating care related to the above assessment and plan.  Ihor Dow, DNP, FNP-C Palliative Medicine Team  Phone: [phone] Fax: [phone]  Please contact Palliative Medicine Team phone at [phone] for questions and concerns.

## 2018-11-15 NOTE — Progress Notes (Signed)
Jinny Blossom, Palliative care NP and Dr. Claretta Fraise at bedside having meeting with family at this time.

## 2018-11-15 NOTE — Progress Notes (Signed)
PROGRESS NOTE                                                                                                                                                                                                             Patient Demographics:    Chase Simmons, is a 85 y.o. male, DOB - June 24, 1933, [id number]  Admit date - 11/13/2018   Admitting Physician Orene Desanctis, DO  Outpatient Primary MD for the patient is Chase Pitch, MD  LOS - 2  Outpatient Specialists: none  Chief Complaint  Patient presents with  . Altered Mental Status       Brief Narrative   84 year old male with advanced dementia, chronic systolic CHF, paroxysmal A. fib (not considered a candidate for anticoagulation due to advanced age and failure to thrive), COPD, type 2 diabetes mellitus, hypertension, recent COVID-19 infection (hospitalized at [facility] from 10/15-10/21) presented from an outside facility with altered mental status and hypoxia with O2 sat of 84%, hypotensive with systolic blood pressure in the 80s.. Chemistry showed sodium of 156, creatinine of 1.95 troponin of 46.  Lactic acid of 2.6, procalcitonin of 0.23, fibrinogen 487 and D-dimer of 4400. CT angiogram of the chest showed bilateral acute PE with significant clot burden. Vascular surgery consulted and patient underwent thrombolysis in bilateral pulmonary artery with TPA.  Admitted to stepdown unit/ICU.    Subjective:   Patient extremely agitated and delirious overnight despite receiving as needed Ativan and fentanyl.  Was placed on low-dose Precedex following which he was comfortable but became bradycardic.  Precedex was stopped but he again was very agitated.  He was then put on dopamine drip to improve his heart rate and start Precedex however he was still having intermittent bradycardia.  MAP of 63 so was started on Levophed drip.     Assessment  & Plan :   Principal problem Acute  respiratory failure with hypoxemia secondary to massive bilateral pulmonary embolism Underwent thrombolysis by vascular surgery.  Became bradycardic with Precedex drip, now on Levophed for low MAP.  Will wean as tolerated.  Intermittent IV fluid bolus.   Active Problems: Hypernatremia Suspect secondary to dehydration.  Continue D5 .  Strict I's/O.  Free water once able to tolerate p.o.  Acute kidney injury Suspect prerenal ATN.  Improved with hydration.  Avoid nephrotoxins.  Strict I's/O.  Dementia  with severe delirium Underlying dementia with ICU delirium.  Not improved with Ativan and fentanyl, required Precedex drip but caused bradycardia.  Per family patient has been on Zoloft for several years for depression and was placed on Seroquel by outpatient neurology for the past few months.  Psych consulted for evaluation and medication adjustment.  Continue mittens and close monitoring.  Will avoid benzos and narcotics as much as possible.  History of COVID-19 infection Was hospitalized at [facility] 4 weeks back.  Repeat COVID-19 positive but no signs of active infection.  Chronic systolic CHF  Patient hypovolemic requiring IV fluid and pressors.  Paroxysmal A. fib Rate controlled.  Previously not considered a candidate for anticoagulation due to advanced age, severe dementia and failure to thrive.  Currently on IV heparin for massive PE  Hypertension Holding amlodipine   COPD Continue as needed bronchodilators  Failure to thrive with persistent delirium Prognosis remains guarded.  Had extensive conversation with patient's son and daughters at bedside by myself and palliative care consult this afternoon.  Family hopeful that patient will improve and be able to get some physical therapy, nutrition and would like to take patient home with them upon discharge.  Do not wish to be aggressive with withdrawing care and would like patient to receive adequate treatment in terms of any treatable or  reversible condition. They finally agree on patient being DNR.  Wished to have further goals of care discussion if condition deteriorates.      Code Status : DNR  Family Communication  : Extensive discussion with patient's children at bedside  Disposition Plan  : Continue ICU monitoring.  Transfer to telemetry once off pressors  Barriers For Discharge : Active symptoms  Consults  : Pulmonary, vascular  Procedures  : CT angiogram of the chest, TPA  DVT Prophylaxis  : IV heparin, Levophed  Lab Results  Component Value Date   PLT 181 11/15/2018    Antibiotics  :   Anti-infectives (From admission, onward)   Start     Dose/Rate Route Frequency Ordered Stop   11/13/18 2129  ceFAZolin (ANCEF) IVPB 2g/100 mL premix     2 g 200 mL/hr over 30 Minutes Intravenous 30 min pre-op 11/13/18 2129 11/13/18 2225        Objective:   Vitals:   11/15/18 1200 11/15/18 1215 11/15/18 1230 11/15/18 1245  BP: (!) 142/105 (!) 143/81 (!) 152/98   Pulse: (!) 45 81 68 (!) 25  Resp:   (!) 25 18  Temp: 98.4 F (36.9 C)     TempSrc: Axillary     SpO2: 94% 100% 100% 100%  Weight:      Height:        Wt Readings from Last 3 Encounters:  11/13/18 76.6 kg  10/20/18 85.6 kg  10/18/18 77.1 kg     Intake/Output Summary (Last 24 hours) at 11/15/2018 1322 Last data filed at 11/15/2018 1255 Gross per 24 hour  Intake 1602.65 ml  Output 1151 ml  Net 451.65 ml    Physical exam Elderly male, restless, noncommunicative HEENT: Dry mucosa, supple neck Chest: Diminished bibasilar breath sound CVs: Normal S1-S2 GI: Soft, nondistended or nontender Musculoskeletal: Warm, no edema     Data Review:    CBC Recent Labs  Lab 11/13/18 1457 11/14/18 0000 11/14/18 1207 11/15/18 0607  WBC 9.1 10.7* 11.2* 9.6  HGB 11.7* 10.0* 9.4* 8.6*  HCT 36.7* 30.9* 29.5* 26.0*  PLT 213 188 179 181  MCV 108.9* 105.5* 107.7* 103.6*  MCH 34.7* 34.1* 34.3* 34.3*  MCHC 31.9 32.4 31.9 33.1  RDW 15.1 15.1  15.3 14.8  LYMPHSABS 1.3 1.2  --   --   MONOABS 0.8 0.8  --   --   EOSABS 0.2 0.2  --   --   BASOSABS 0.0 0.0  --   --     Chemistries  Recent Labs  Lab 11/13/18 1457 11/14/18 0000 11/14/18 1207 11/15/18 0607  NA 156* 153* 155* 151*  K 3.6 3.7 3.7 3.5  CL 121* 120* 124* 124*  CO2 22 23 22 22   GLUCOSE 134* 117* 106* 103*  BUN 61* 58* 53* 36*  CREATININE 1.95* 1.75* 1.25* 0.85  CALCIUM 8.6* 7.9* 7.7* 7.7*  MG  --  1.6*  --  1.5*  AST 22  --  21  --   ALT 16  --  12  --   ALKPHOS 85  --  73  --   BILITOT 0.8  --  0.7  --    ------------------------------------------------------------------------------------------------------------------ Recent Labs    11/13/18 1457  TRIG 125    Lab Results  Component Value Date   HGBA1C 6.0 (H) 10/20/2018   ------------------------------------------------------------------------------------------------------------------ No results for input(s): TSH, T4TOTAL, T3FREE, THYROIDAB in the last 72 hours.  Invalid input(s): FREET3 ------------------------------------------------------------------------------------------------------------------ Recent Labs    11/13/18 1457  FERRITIN 287    Coagulation profile Recent Labs  Lab 11/13/18 1457  INR 1.2    No results for input(s): DDIMER in the last 72 hours.  Cardiac Enzymes No results for input(s): CKMB, TROPONINI, MYOGLOBIN in the last 168 hours.  Invalid input(s): CK ------------------------------------------------------------------------------------------------------------------    Component Value Date/Time   BNP 31.0 11/14/2018 0000   BNP 51 11/08/2016 1656    Inpatient Medications  Scheduled Meds: . atorvastatin  20 mg Oral q1800  . Chlorhexidine Gluconate Cloth  6 each Topical Q0600  . gabapentin  100 mg Oral QHS  . insulin aspart  0-9 Units Subcutaneous Q4H  . pantoprazole  40 mg Oral Daily  . PARoxetine  30 mg Oral Daily  . primidone  100 mg Oral TID  .  QUEtiapine  100 mg Oral QHS  . QUEtiapine  50 mg Oral BID  . traZODone  100 mg Oral QHS   Continuous Infusions: . dexmedetomidine (PRECEDEX) IV infusion Stopped (11/15/18 0626)  . dextrose 20 mL/hr at 11/15/18 1255  . DOPamine Stopped (11/15/18 0631)  . heparin 900 Units/hr (11/15/18 1255)  . magnesium sulfate bolus IVPB 2 g (11/15/18 1312)  . norepinephrine (LEVOPHED) Adult infusion Stopped (11/15/18 1231)   PRN Meds:.fentaNYL (SUBLIMAZE) injection, LORazepam, polyethylene glycol  Micro Results Recent Results (from the past 240 hour(s))  SARS CORONAVIRUS 2 (TAT 6-24 HRS) Nasopharyngeal Nasopharyngeal Swab     Status: Abnormal   Collection Time: 11/13/18  2:57 PM   Specimen: Nasopharyngeal Swab  Result Value Ref Range Status   SARS Coronavirus 2 POSITIVE (A) NEGATIVE Final    Comment: RESULT CALLED TO, READ BACK BY AND VERIFIED WITH: M. CAMPBELL,RN 0146 11/14/2018 T. TYSOR (NOTE) SARS-CoV-2 target nucleic acids are DETECTED. The SARS-CoV-2 RNA is generally detectable in upper and lower respiratory specimens during the acute phase of infection. Positive results are indicative of active infection with SARS-CoV-2. Clinical  correlation with patient history and other diagnostic information is necessary to determine patient infection status. Positive results do  not rule out bacterial infection or co-infection with other viruses. The expected result is Negative. Fact Sheet for Patients: SugarRoll.be Fact Sheet for  Healthcare Providers: https://www.woods-mathews.com/ This test is not yet approved or cleared by the [place] and  has been authorized for detection and/or diagnosis of SARS-CoV-2 by FDA under an Emergency Use Authorization (EUA). This EUA will remain  in effect (meaning this test can be used) f or the duration of the COVID-19 declaration under Section 564(b)(1) of the Act, 21 U.S.C. section 360bbb-3(b)(1), unless the  authorization is terminated or revoked sooner. Performed at [facility], [place] [address] [place], [place] [address]   Culture, blood (Routine x 2)     Status: None (Preliminary result)   Collection Time: 11/13/18  2:59 PM   Specimen: BLOOD  Result Value Ref Range Status   Specimen Description BLOOD BLOOD RIGHT ARM  Final   Special Requests   Final    BOTTLES DRAWN AEROBIC AND ANAEROBIC Blood Culture adequate volume   Culture   Final    NO GROWTH 2 DAYS Performed at [facility], [address] [place], [place] [address]    Report Status PENDING  Incomplete  Blood Culture (routine x 2)     Status: None (Preliminary result)   Collection Time: 11/13/18  3:00 PM   Specimen: BLOOD  Result Value Ref Range Status   Specimen Description BLOOD BLOOD LEFT ARM  Final   Special Requests   Final    BOTTLES DRAWN AEROBIC AND ANAEROBIC Blood Culture adequate volume   Culture   Final    NO GROWTH 2 DAYS Performed at [facility], [address] [place], [place] [address]    Report Status PENDING  Incomplete  Urine Culture     Status: Abnormal   Collection Time: 11/14/18  1:41 AM   Specimen: Urine, Random  Result Value Ref Range Status   Specimen Description   Final    URINE, RANDOM Performed at [facility], [address] [place], [place] [address]    Special Requests   Final    NONE Performed at [facility], [address] [place], [place] [address]    Culture MULTIPLE SPECIES PRESENT, SUGGEST RECOLLECTION (A)  Final   Report Status 11/15/2018 FINAL  Final    Radiology Reports Ct Head Wo Contrast  Result Date: 11/14/2018 CLINICAL DATA:  Encephalopathy EXAM: CT HEAD WITHOUT CONTRAST TECHNIQUE: Contiguous axial images were obtained from the base of the skull through the vertex without intravenous contrast. COMPARISON:  Head CT 06/06/2018 FINDINGS: Brain: There is no mass, hemorrhage or extra-axial collection. There is generalized  atrophy without lobar predilection. Hypodensity of the white matter is most commonly associated with chronic microvascular disease. Vascular: No abnormal hyperdensity of the major intracranial arteries or dural venous sinuses. No intracranial atherosclerosis. Skull: The visualized skull base, calvarium and extracranial soft tissues are normal. Sinuses/Orbits: No fluid levels or advanced mucosal thickening of the visualized paranasal sinuses. No mastoid or middle ear effusion. The orbits are normal. IMPRESSION: Generalized atrophy and chronic microvascular ischemia without acute intracranial abnormality. Electronically Signed   By: Ulyses Jarred M.D.   On: 11/14/2018 03:08   Ct Angio Chest Pe W And/or Wo Contrast  Result Date: 11/13/2018 CLINICAL DATA:  Hypoxia. Elevated D-dimer. The patient is COVID positive EXAM: CT ANGIOGRAPHY CHEST WITH CONTRAST TECHNIQUE: Multidetector CT imaging of the chest was performed using the standard protocol during bolus administration of intravenous contrast. Multiplanar CT image reconstructions and MIPs were obtained to evaluate the vascular anatomy. CONTRAST:  20mL OMNIPAQUE IOHEXOL 350 MG/ML SOLN COMPARISON:  CT dated  June 06, 2018. FINDINGS: Cardiovascular: Contrast injection is sufficient to demonstrate satisfactory opacification of the pulmonary arteries to the segmental level.There are acute bilateral pulmonary emboli. For example there is an embolus in the main right pulmonary artery extending into the right, middle, and upper lobar branches. There are acute pulmonary emboli involving the left lower lobe pulmonary artery extending into segmental and subsegmental branches. The main pulmonary artery is within normal limits for size. There is no evidence for right-sided heart strain with the RV LV ratio measuring less than 0.9. There is some reflux into the IVC consistent with some underlying cardiac dysfunction. Atherosclerotic changes are noted throughout the thoracic aorta.  Mediastinum/Nodes: --there is a 4.5 x 1.6 cm right upper paratracheal mass. This is essentially unchanged from prior CT in 2018 and is favored to represent a inferior right thyroid nodule/mass. --No axillary lymphadenopathy. --No supraclavicular lymphadenopathy. --Normal thyroid gland. --The esophagus is unremarkable Lungs/Pleura: There is scattered ground-glass airspace opacities bilaterally. There is no pneumothorax. There is no significant pleural effusion. The trachea is unremarkable. Upper Abdomen: There is cholelithiasis without CT evidence for acute cholecystitis. Musculoskeletal: There is an old healed fracture of the sternum. No new acute displaced fracture. Review of the MIP images confirms the above findings. IMPRESSION: 1. Acute bilateral pulmonary emboli as detailed above, greatest on the right. While there is no CT evidence for right heart strain, there is some reflux of contrast into the IVC consistent with some degree of right-sided heart dysfunction. 2. Bilateral ground-glass airspace opacities consistent with the patient's history of viral pneumonia. 3. There is cholelithiasis without secondary signs of acute cholecystitis. Aortic Atherosclerosis (ICD10-I70.0). These results were called by telephone at the time of interpretation on 11/13/2018 at 6:01 pm to provider [facility] , who verbally acknowledged these results. Electronically Signed   By: Constance Holster M.D.   On: 11/13/2018 18:05   Dg Chest Port 1 View  Result Date: 11/13/2018 CLINICAL DATA:  Shortness of breath.  COVID-19. EXAM: PORTABLE CHEST 1 VIEW COMPARISON:  10/23/2018 FINDINGS: The heart size and mediastinal contours are within normal limits. Both lungs are clear. The visualized skeletal structures are unremarkable. IMPRESSION: No active disease. Electronically Signed   By: Lorriane Shire M.D.   On: 11/13/2018 15:32   Dg Chest Port 1 View  Result Date: 10/20/2018 CLINICAL DATA:  Shortness of breath.  COVID-19  virus infection. EXAM: PORTABLE CHEST 1 VIEW COMPARISON:  10/18/2018 FINDINGS: The heart size and mediastinal contours are within normal limits. Aortic atherosclerosis. Stable prominence of pulmonary interstitial markings. No evidence of pulmonary airspace disease or pleural effusion. The visualized skeletal structures are unremarkable. IMPRESSION: Stable exam.  No active disease. Electronically Signed   By: Marlaine Hind M.D.   On: 10/20/2018 12:12   Dg Chest Portable 1 View  Result Date: 10/18/2018 CLINICAL DATA:  COVID-19 positive. Diabetes. Hypertension. COPD. Bladder cancer. EXAM: PORTABLE CHEST 1 VIEW COMPARISON:  06/06/2018 FINDINGS: Two frontal views of the chest. Midline trachea. Borderline cardiomegaly. Atherosclerosis in the transverse aorta. No pleural effusion or pneumothorax. Moderate pulmonary interstitial thickening is not significantly changed. Bibasilar opacities, favored to represent scarring or subsegmental atelectasis, similar. IMPRESSION: Chronic interstitial thickening, likely related to the clinical history of COPD. No acute superimposed process. Aortic Atherosclerosis (ICD10-I70.0). Electronically Signed   By: Abigail Miyamoto M.D.   On: 10/18/2018 18:33   Dg Chest Port 1v Same Day  Result Date: 10/23/2018 CLINICAL DATA:  Shortness of breath. History of diabetes, COPD and bladder cancer. EXAM: PORTABLE  CHEST 1 VIEW COMPARISON:  Radiographs 10/20/2018 and 10/18/2018. CT 06/06/2018. FINDINGS: 1148 hours. The heart size and mediastinal contours are stable. There is aortic atherosclerosis. There is stable mild interstitial and central airway thickening which appears chronic. No superimposed airspace disease, pleural effusion or pneumothorax. And old rib fractures noted on the right. IMPRESSION: 1. Stable chronic lung disease. No acute cardiopulmonary process. 2. Aortic atherosclerosis. Electronically Signed   By: Richardean Sale M.D.   On: 10/23/2018 12:13    Time Spent in minutes  35    Ashara Lounsbury M.D on 11/15/2018 at 1:22 PM  Between 7am to 7pm - Pager - [phone]  After 7pm go to [email or web address] - password [facility]  [facility] -  Office  [phone]

## 2018-11-15 NOTE — Progress Notes (Signed)
[place] for Heparin  Indication: pulmonary embolus  Allergies  Allergen Reactions  . Latex Itching    Patient Measurements: Height: 5\' 9"  (175.3 cm) Weight: 168 lb 14 oz (76.6 kg) IBW/kg (Calculated) : 70.7  Use TBW for heparin dosing.  Vital Signs: Temp: 98 F (36.7 C) (11/12 0800) Temp Source: Axillary (11/12 0800) BP: 143/81 (11/12 1215) Pulse Rate: 81 (11/12 1215)  Labs: Recent Labs    11/13/18 1457 11/14/18 0000  11/14/18 1207 11/14/18 2309 11/15/18 0607 11/15/18 0927  HGB 11.7* 10.0*  --  9.4*  --  8.6*  --   HCT 36.7* 30.9*  --  29.5*  --  26.0*  --   PLT 213 188  --  179  --  181  --   APTT 32  --   --   --   --   --   --   LABPROT 15.1  --   --   --   --   --   --   INR 1.2  --   --   --   --   --   --   HEPARINUNFRC  --   --    < > 1.05* 0.74*  --  0.55  CREATININE 1.95* 1.75*  --  1.25*  --  0.85  --   TROPONINIHS 46*  --   --   --   --  50*  --    < > = values in this interval not displayed.    Estimated Creatinine Clearance: 65.8 mL/min (by C-G formula based on SCr of 0.85 mg/dL).   Assessment: Pharmacy consulted to dose heparin in this 85 year old male admitted with PE.  No prior anticoagulation noted. H&H trending down, PLT WNL, baseline INR 1.2, aPTT 32s.  Heparin Course: 11/10 initiation: 6000 unit bolus, then 1500 units/hr 11/11 0402 HL 1.25: reduced to 1300 units/hr 11/11 1207 HL 1.05: hold for 1 hour and reduce rate to 1000 units/hr 11/11 2309 HL 0.74, reduce rate to 900 units/hr 11/12 0927 HL 0.55, therapeutic x 1  Goal of Therapy:  Heparin level 0.3-0.7 units/ml Monitor platelets by anticoagulation protocol: Yes   Plan:  - Continue heparin infusion at 900 units/hr - Recheck HL in 8 hours at 1800 - Check CBC in AM  Raiford [name], PharmD Candidate 11/15/2018,12:23 PM

## 2018-11-15 NOTE — Progress Notes (Signed)
Initial Nutrition Assessment  DOCUMENTATION CODES:   Not applicable  INTERVENTION:  -Continue to monitor for GOC -Advance diet as medically feasible and provide nutrition supplements as appropriate  If unable to advance diet recommend placing NGT and initiating: -Osmolite 1.2 @ 20 ml/hr, advance 10 ml every 8 hrs to goal rate 60 ml/hr (1440 ml/day) Prostat 30 ml via tube BID  Tube feed regimen at goal provides 1928 kcal, 110 grams protein, and 1181 ml free H20    NUTRITION DIAGNOSIS:   Increased nutrient needs related to acute illness, chronic illness, wound healing(acute bilateral PE; recent COVID infection on chronic CHF; COPD; stage 1; mid; sacrum) as evidenced by estimated needs, percent weight loss.  GOAL:   Patient will meet greater than or equal to 90% of their needs  MONITOR:   Labs, I & O's, Weight trends, Diet advancement  REASON FOR ASSESSMENT:   Consult Assessment of nutrition requirement/status  ASSESSMENT:  RD working remotely.  85 year old male with past medical history significant of advanced dementia, chronic systolic heart failure, afib, HTN, COPD, T2DM, recent COVID infection ([place] 10/15-10/21) who presented from outside facility with concerns of AMS and hypoxia.  CT angiogram of chest showed bilateral acute PE with significant clot burden and pt underwent thrombolysis in bilateral pulmonary artery with TPA.   Unable to obtain nutrition history at this time. Per chart review, pt continues to have worsening agitation/delirium despite ativan and pm fentanly for pain management, psych consult pending to assist with management. Prognosis extremely guarded per MD note, daughter to meet with MD and pulmonologist today. Will continue to monitor for [place].  At this time patient is NPO. Recommend initiating enteral nutrition if diet unable to advance and pt continues with full scope of care. Recommendations have been provided above.    I/Os: +4818 ml since admit     +698 ml x 24 hrs UOP: 1200 ml x 24 hrs  Weights have been trending down over the past year per review of history. Patient with noted 31 lb (15.5%) wt loss in 5 months which is severe for time frame and 60.9 lb (20.3%) wt loss over the past 14 months.  11/13/18 76.6 kg  10/20/18 85.6 kg  10/18/18 77.1 kg  06/06/18 90.7 kg  02/02/18 102.8 kg  01/28/18 104.3 kg  10/02/17 105.2 kg  09/14/17 104.3 kg   Suspect patient has severe malnutrition given the severe wt losses, recent COVID infection and history of present illness. Unable to complete physical exam at this time to assess degree of fat and muscle depletions; RD working remotely.  Medications reviewed and include: Gabapentin, SSI Precedex 8 mcg D10 Dopamine 5 mcg Heparin Levo 4 mcg Labs: Na 151 (H), Mg 1.5 (L), Hgb 8.6 (L)   NUTRITION - FOCUSED PHYSICAL EXAM: Unable to complete at this time, RD working remotely. Will plan to complete at follow up.   Diet Order:   Diet Order    None      EDUCATION NEEDS:   No education needs have been identified at this time  Skin:  Skin Assessment: Reviewed RN Assessment(stage1;mid;sacrum)  Last BM:  11/12 (type 6;brown;small)  Height:   Ht Readings from Last 1 Encounters:  11/13/18 5\' 9"  (1.753 m)    Weight:   Wt Readings from Last 1 Encounters:  11/13/18 76.6 kg    Ideal Body Weight:  72.7 kg  BMI:  Body mass index is 24.94 kg/m.  Estimated Nutritional Needs:   Kcal:  1900-2100  Protein:  100-115  Fluid:  >/= 1.9 L/day  Lajuan Lines, RD, LDN Clinical Nutrition [place] Telephone [phone] After Hours/Weekend Pager: [phone]

## 2018-11-16 DIAGNOSIS — Z515 Encounter for palliative care: Secondary | ICD-10-CM

## 2018-11-16 LAB — HEPARIN LEVEL (UNFRACTIONATED)
Heparin Unfractionated: 0.31 IU/mL (ref 0.30–0.70)
Heparin Unfractionated: 0.3 IU/mL (ref 0.30–0.70)

## 2018-11-16 LAB — BASIC METABOLIC PANEL
Anion gap: 8 (ref 5–15)
BUN: 28 mg/dL — ABNORMAL HIGH (ref 8–23)
CO2: 22 mmol/L (ref 22–32)
Calcium: 8 mg/dL — ABNORMAL LOW (ref 8.9–10.3)
Chloride: 122 mmol/L — ABNORMAL HIGH (ref 98–111)
Creatinine, Ser: 0.65 mg/dL (ref 0.61–1.24)
GFR calc Af Amer: >60 mL/min (ref >60)
GFR calc non Af Amer: >60 mL/min (ref >60)
Glucose, Bld: 122 mg/dL — ABNORMAL HIGH (ref 70–99)
Potassium: 3.7 mmol/L (ref 3.5–5.1)
Sodium: 152 mmol/L — ABNORMAL HIGH (ref 135–145)

## 2018-11-16 LAB — GLUCOSE, CAPILLARY
Glucose-Capillary: 105 mg/dL — ABNORMAL HIGH (ref 70–99)
Glucose-Capillary: 126 mg/dL — ABNORMAL HIGH (ref 70–99)
Glucose-Capillary: 115 mg/dL — ABNORMAL HIGH (ref 70–99)
Glucose-Capillary: 130 mg/dL — ABNORMAL HIGH (ref 70–99)
Glucose-Capillary: 114 mg/dL — ABNORMAL HIGH (ref 70–99)

## 2018-11-16 LAB — CBC
HCT: 27.4 % — ABNORMAL LOW (ref 39.0–52.0)
Hemoglobin: 8.8 g/dL — ABNORMAL LOW (ref 13.0–17.0)
MCH: 34.1 pg — ABNORMAL HIGH (ref 26.0–34.0)
MCHC: 32.1 g/dL (ref 30.0–36.0)
MCV: 106.2 fL — ABNORMAL HIGH (ref 80.0–100.0)
Platelets: 150 10*3/uL (ref 150–400)
RBC: 2.58 MIL/uL — ABNORMAL LOW (ref 4.22–5.81)
RDW: 14.6 % (ref 11.5–15.5)
WBC: 6.5 10*3/uL (ref 4.0–10.5)
nRBC: 0 % (ref 0.0–0.2)

## 2018-11-16 LAB — PHOSPHORUS: Phosphorus: 2 mg/dL — ABNORMAL LOW (ref 2.5–4.6)

## 2018-11-16 LAB — MAGNESIUM: Magnesium: 1.7 mg/dL (ref 1.7–2.4)

## 2018-11-16 MED ORDER — CHLORHEXIDINE GLUCONATE 0.12 % MT SOLN
15.0000 mL | Freq: Two times a day (BID) | OROMUCOSAL | Status: DC
  Administered 2018-11-16 – 2018-11-30 (×17): 15 mL via OROMUCOSAL
  Filled 2018-11-16 (×14): qty 15

## 2018-11-16 MED ORDER — POTASSIUM PHOSPHATES 15 MMOLE/5ML IV SOLN
10.0000 mmol | Freq: Once | INTRAVENOUS | Status: AC
  Administered 2018-11-16: 10 mmol via INTRAVENOUS
  Filled 2018-11-16: qty 3.33

## 2018-11-16 MED ORDER — ORAL CARE MOUTH RINSE
15.0000 mL | Freq: Two times a day (BID) | OROMUCOSAL | Status: DC
  Administered 2018-11-16 – 2018-12-03 (×16): 15 mL via OROMUCOSAL

## 2018-11-16 MED ORDER — MAGNESIUM SULFATE 2 GM/50ML IV SOLN
2.0000 g | Freq: Once | INTRAVENOUS | Status: AC
  Administered 2018-11-16: 2 g via INTRAVENOUS
  Filled 2018-11-16: qty 50

## 2018-11-16 NOTE — Progress Notes (Signed)
[facility] Daily Progress Note   Subjective: 3 Days Post-Op: 1. Contrast injection right heart 2. Thrombolysiswith 4 mg of TPA in the right pulmonary arteries and 2 mg of TPA in the left lower lobe pulmonary artery 3. Mechanical thrombectomyto the left lower lobe pulmonary artery, and the right upper, middle, and lower lobepulmonary arteries 4. Selective catheter placement rightupper, middle, and lower lobe pulmonary arteries 5. Selective catheter placement leftlower lobe pulmonary artery  Patient resting comfortable in bed this AM. On room air at this time.   Objective: Vitals:   11/16/18 0600 11/16/18 0700 11/16/18 0740 11/16/18 0800  BP: 127/74 (!) 146/85  (!) 139/102  Pulse: 90 90    Resp: (!) 24 (!) 25 (!) 24 (!) 23  Temp:   98.3 F (36.8 C)   TempSrc:   Axillary   SpO2: 100% 100% 100% 100%  Weight:      Height:        Intake/Output Summary (Last 24 hours) at 11/16/2018 1008 Last data filed at 11/16/2018 0800 Gross per 24 hour  Intake 769.88 ml  Output 501 ml  Net 268.88 ml   Physical Exam: Sedated, Calm, NAD CV: RRR Pulmonary: CTA Bilaterally, non-labored Abdomen: Soft, Nontender, Nondistended Right Groin: Dressing intact, clean and dry Vascular:  Lower Extremity: Thigh soft, calf soft, extremity warm distally to toes.   Laboratory: CBC    Component Value Date/Time   WBC 6.5 11/16/2018 0345   HGB 8.8 (L) 11/16/2018 0345   HGB 13.7 01/08/2014 1611   HCT 27.4 (L) 11/16/2018 0345   HCT 36.1 (L) 04/20/2018 1054   PLT 150 11/16/2018 0345   PLT 165 01/08/2014 1611   BMET    Component Value Date/Time   NA 152 (H) 11/16/2018 0345   NA 137 01/08/2014 1611   K 3.7 11/16/2018 0345   K 4.4 01/08/2014 1611   CL 122 (H) 11/16/2018 0345   CL 102 01/08/2014 1611   CO2 22 11/16/2018 0345   CO2 26 01/08/2014 1611   GLUCOSE 122 (H) 11/16/2018 0345   GLUCOSE 172 (H)  01/08/2014 1611   BUN 28 (H) 11/16/2018 0345   BUN 18 01/08/2014 1611   CREATININE 0.65 11/16/2018 0345   CREATININE 1.13 01/08/2014 1611   CALCIUM 8.0 (L) 11/16/2018 0345   CALCIUM 8.4 (L) 01/08/2014 1611   GFRNONAA >60 11/16/2018 0345   GFRNONAA >60 01/08/2014 1611   GFRNONAA >60 12/15/2011 1257   GFRAA >60 11/16/2018 0345   GFRAA >60 01/08/2014 1611   GFRAA >60 12/15/2011 1257   Assessment/Planning: The patient is an 85 year old male with multiple medical issues including recent diagnosis of PE - s/p thrombolysis / thrombectomy - POD#3 1) now on room air 2) continue heparin - transition to Eliquis when patient is taking PO 3) if patient doesn't tolerated anticoagulation would consider placing a filter  4) no further recommendations from vascular at this time - will see patient in outpatient setting to follow DVT 5) vascular to sign off at this time - please reconsult if needed  Discussed with Dr. Ellis Parents Ronnisha Felber PA-C 11/16/2018 10:08 AM

## 2018-11-16 NOTE — Progress Notes (Signed)
Shift summary:  - No acute changes this shift. - Heparin gtt now at 11 mL/hr. - No changes to POC following Palliative care meeting with family today. - Still no routine for nutrition.

## 2018-11-16 NOTE — Progress Notes (Signed)
[facility] for Heparin  Indication: pulmonary embolus  Allergies  Allergen Reactions  . Latex Itching   Patient Measurements: Height: 5\' 9"  (175.3 cm) Weight: 168 lb 14 oz (76.6 kg) IBW/kg (Calculated) : 70.7  Use TBW for heparin dosing.  Vital Signs: Temp: 98.7 F (37.1 C) (11/13 0400) Temp Source: Axillary (11/13 0400) BP: 131/94 (11/13 0400) Pulse Rate: 87 (11/13 0400)  Labs: Recent Labs    11/13/18 1457 11/14/18 0000  11/14/18 1207  11/15/18 0607 11/15/18 0927 11/15/18 1837 11/16/18 0345  HGB 11.7* 10.0*  --  9.4*  --  8.6*  --   --  8.8*  HCT 36.7* 30.9*  --  29.5*  --  26.0*  --   --  27.4*  PLT 213 188  --  179  --  181  --   --  150  APTT 32  --   --   --   --   --   --   --   --   LABPROT 15.1  --   --   --   --   --   --   --   --   INR 1.2  --   --   --   --   --   --   --   --   HEPARINUNFRC  --   --    < > 1.05*   < >  --  0.55 0.37 0.31  CREATININE 1.95* 1.75*  --  1.25*  --  0.85  --   --   --   TROPONINIHS 46*  --   --   --   --  50*  --   --   --    < > = values in this interval not displayed.    Estimated Creatinine Clearance: 65.8 mL/min (by C-G formula based on SCr of 0.85 mg/dL).   Assessment: Pharmacy consulted to dose heparin in this 85 year old male admitted with PE.  No prior anticoagulation noted. H&H trending down, PLT WNL, baseline INR 1.2, aPTT 32s.  Heparin Course: 11/10 initiation: 6000 unit bolus, then 1500 units/hr 11/11 0402 HL 1.25: reduced to 1300 units/hr 11/11 1207 HL 1.05: hold for 1 hour and reduce rate to 1000 units/hr 11/11 2309 HL 0.74, reduce rate to 900 units/hr 11/12 0927 HL 0.55, therapeutic x 1 11/12 1837 HL 0.37, therapeutic x 2 11/13 0345 HL 0.31, therapeutic x 3 (on low end of range and trending down)  CBC stable  Goal of Therapy:  Heparin level 0.3-0.7 units/ml Monitor platelets by anticoagulation protocol: Yes   Plan:  - Increase heparin infusion to 1000 units/hr  to discourage drop to subtherapeutic level - Recheck HL/CBC in AM  Ena Dawley, PharmD Clinical Pharmacist 11/16/2018 5:22 AM

## 2018-11-16 NOTE — Plan of Care (Signed)

## 2018-11-16 NOTE — Progress Notes (Signed)
PROGRESS NOTE                                                                                                                                                                                                             Patient Demographics:    Kanan Ooms, is a 85 y.o. male, DOB - November 21, 1933, [id number]  Admit date - 11/13/2018   Admitting Physician Orene Desanctis, DO  Outpatient Primary MD for the patient is [name], MD  LOS - 3  Outpatient Specialists: none  Chief Complaint  Patient presents with  . Altered Mental Status       Brief Narrative   85 year old male with advanced dementia, chronic systolic CHF, paroxysmal A. fib (not considered a candidate for anticoagulation due to advanced age and failure to thrive), COPD, type 2 diabetes mellitus, hypertension, recent COVID-19 infection (hospitalized at [facility] from 10/15-10/21) presented from an outside facility with altered mental status and hypoxia with O2 sat of 84%, hypotensive with systolic blood pressure in the 80s.. Chemistry showed sodium of 156, creatinine of 1.95 troponin of 46.  Lactic acid of 2.6, procalcitonin of 0.23, fibrinogen 487 and D-dimer of 4400. CT angiogram of the chest showed bilateral acute PE with significant clot burden. Vascular surgery consulted and patient underwent thrombolysis in bilateral pulmonary artery with TPA.  Admitted to stepdown unit/ICU.    Subjective:   Again had episodes of agitations overnight.  Given Ativan.  Patient sleepy this morning.    Assessment  & Plan :   Principal problem Acute respiratory failure with hypoxemia secondary to massive bilateral pulmonary embolism Underwent thrombolysis by vascular surgery.  Became bradycardic with Precedex drip, given Levophed for low MAP.  Now weaned off.  Blood pressure stable. Patient on IV heparin drip.  Transition to Eliquis once able to take p.o.  Vascular surgery  recommends IVC filter if patient unable to take p.o.  Have signed off with plan on outpatient follow-up   Active Problems: Hypernatremia Secondary to dehydration.  On D5.  Unable to take p.o.  Acute kidney injury Suspect prerenal ATN.  Improved with hydration.  Avoid nephrotoxins.  Strict I's/O.  Dementia with severe delirium Underlying dementia with ICU delirium.  Received Precedex on 11/11 and became bradycardic. Psych consult appreciated and on as needed IV Haldol and Ativan. Per family patient has been on  Zoloft for several years for depression and was placed on Seroquel by outpatient neurology for the past few months.   Continue mittens and close monitoring.    History of COVID-19 infection Was hospitalized at [facility] 4 weeks back.  Repeat COVID-19 positive but no signs of active infection.    Chronic systolic CHF  Patient hypovolemic requiring IV fluid and pressors.  Paroxysmal A. fib Rate controlled.  Previously not considered a candidate for anticoagulation due to advanced age, severe dementia and failure to thrive.  Currently on IV heparin for massive PE.  Plan as above  Hypertension Holding amlodipine   COPD Continue as needed bronchodilators  Failure to thrive with persistent delirium Prognosis remains guarded.  Had extensive conversation with patient's son and daughters at bedside by myself and palliative care consult on 11/12.  Family hopeful that patient will improve and be able to get some physical therapy, nutrition and would like to take patient home with them upon discharge.  Do not wish to be aggressive with withdrawing care and would like patient to receive adequate treatment in terms of any treatable or reversible condition. They finally agree on patient being DNR.  Wished to have further goals of care discussion if condition deteriorates.  Family now interested in feeding tube placement.  We will have to wait for any clinical improvement over the next 24-48 hours  and reconsider for it.  Patient is frequently agitated and will not tolerate an NG feeding at present.  Also risk of pulling out PEG tube feeding if placed.      Code Status : DNR  Family Communication  : Extensive discussion with patient's children at bedside  Disposition Plan  : Transfer to MedSurg  Barriers For Discharge : Active symptoms  Consults  : Pulmonary, vascular  Procedures  : CT angiogram of the chest, TPA  DVT Prophylaxis  : IV heparin Lab Results  Component Value Date   PLT 150 11/16/2018    Antibiotics  :   Anti-infectives (From admission, onward)   Start     Dose/Rate Route Frequency Ordered Stop   11/13/18 2129  ceFAZolin (ANCEF) IVPB 2g/100 mL premix     2 g 200 mL/hr over 30 Minutes Intravenous 30 min pre-op 11/13/18 2129 11/13/18 2225        Objective:   Vitals:   11/16/18 0800 11/16/18 0900 11/16/18 1000 11/16/18 1100  BP: (!) 139/102 (!) 129/105 124/78 (!) 137/48  Pulse: 67 81 63 70  Resp: (!) 23 19    Temp:      TempSrc:      SpO2: 100% 98% 100% 100%  Weight:      Height:        Wt Readings from Last 3 Encounters:  11/13/18 76.6 kg  10/20/18 85.6 kg  10/18/18 77.1 kg     Intake/Output Summary (Last 24 hours) at 11/16/2018 1237 Last data filed at 11/16/2018 1100 Gross per 24 hour  Intake 986.44 ml  Output 501 ml  Net 485.44 ml   Physical exam Elderly male sleepy, not in distress HEENT: Dry mucosa, supple neck Chest: Clear breath sounds bilaterally CVs: Normal S1-S2, no murmurs GI: Soft, nondistended nontender Musculoskeletal: Warm, no edema     Data Review:    CBC Recent Labs  Lab 11/13/18 1457 11/14/18 0000 11/14/18 1207 11/15/18 0607 11/16/18 0345  WBC 9.1 10.7* 11.2* 9.6 6.5  HGB 11.7* 10.0* 9.4* 8.6* 8.8*  HCT 36.7* 30.9* 29.5* 26.0* 27.4*  PLT 213 188 179  181 150  MCV 108.9* 105.5* 107.7* 103.6* 106.2*  MCH 34.7* 34.1* 34.3* 34.3* 34.1*  MCHC 31.9 32.4 31.9 33.1 32.1  RDW 15.1 15.1 15.3 14.8 14.6   LYMPHSABS 1.3 1.2  --   --   --   MONOABS 0.8 0.8  --   --   --   EOSABS 0.2 0.2  --   --   --   BASOSABS 0.0 0.0  --   --   --     Chemistries  Recent Labs  Lab 11/13/18 1457 11/14/18 0000 11/14/18 1207 11/15/18 0607 11/16/18 0345  NA 156* 153* 155* 151* 152*  K 3.6 3.7 3.7 3.5 3.7  CL 121* 120* 124* 124* 122*  CO2 22 23 22 22 22   GLUCOSE 134* 117* 106* 103* 122*  BUN 61* 58* 53* 36* 28*  CREATININE 1.95* 1.75* 1.25* 0.85 0.65  CALCIUM 8.6* 7.9* 7.7* 7.7* 8.0*  MG  --  1.6*  --  1.5* 1.7  AST 22  --  21  --   --   ALT 16  --  12  --   --   ALKPHOS 85  --  73  --   --   BILITOT 0.8  --  0.7  --   --    ------------------------------------------------------------------------------------------------------------------ Recent Labs    11/13/18 1457  TRIG 125    Lab Results  Component Value Date   HGBA1C 6.0 (H) 10/20/2018   ------------------------------------------------------------------------------------------------------------------ No results for input(s): TSH, T4TOTAL, T3FREE, THYROIDAB in the last 72 hours.  Invalid input(s): FREET3 ------------------------------------------------------------------------------------------------------------------ Recent Labs    11/13/18 1457  FERRITIN 287    Coagulation profile Recent Labs  Lab 11/13/18 1457  INR 1.2    No results for input(s): DDIMER in the last 72 hours.  Cardiac Enzymes No results for input(s): CKMB, TROPONINI, MYOGLOBIN in the last 168 hours.  Invalid input(s): CK ------------------------------------------------------------------------------------------------------------------    Component Value Date/Time   BNP 31.0 11/14/2018 0000   BNP 51 11/08/2016 1656    Inpatient Medications  Scheduled Meds: . atorvastatin  20 mg Oral q1800  . chlorhexidine  15 mL Mouth Rinse BID  . Chlorhexidine Gluconate Cloth  6 each Topical Q0600  . gabapentin  100 mg Oral QHS  . insulin aspart  0-9 Units  Subcutaneous Q4H  . mouth rinse  15 mL Mouth Rinse q12n4p  . pantoprazole  40 mg Oral Daily  . PARoxetine  30 mg Oral Daily  . primidone  100 mg Oral TID  . QUEtiapine  100 mg Oral QHS  . QUEtiapine  50 mg Oral BID  . traZODone  100 mg Oral QHS   Continuous Infusions: . dextrose 20 mL/hr at 11/16/18 1100  . heparin 1,000 Units/hr (11/16/18 1100)  . magnesium sulfate bolus IVPB    . potassium PHOSPHATE IVPB (in mmol) 42 mL/hr at 11/16/18 1100   PRN Meds:.fentaNYL (SUBLIMAZE) injection, haloperidol lactate, LORazepam, polyethylene glycol  Micro Results Recent Results (from the past 240 hour(s))  SARS CORONAVIRUS 2 (TAT 6-24 HRS) Nasopharyngeal Nasopharyngeal Swab     Status: Abnormal   Collection Time: 11/13/18  2:57 PM   Specimen: Nasopharyngeal Swab  Result Value Ref Range Status   SARS Coronavirus 2 POSITIVE (A) NEGATIVE Final    Comment: RESULT CALLED TO, READ BACK BY AND VERIFIED WITH: M. CAMPBELL,RN 0146 11/14/2018 T. TYSOR (NOTE) SARS-CoV-2 target nucleic acids are DETECTED. The SARS-CoV-2 RNA is generally detectable in upper and lower respiratory specimens during the acute phase  of infection. Positive results are indicative of active infection with SARS-CoV-2. Clinical  correlation with patient history and other diagnostic information is necessary to determine patient infection status. Positive results do  not rule out bacterial infection or co-infection with other viruses. The expected result is Negative. Fact Sheet for Patients: SugarRoll.be Fact Sheet for Healthcare Providers: https://www.woods-mathews.com/ This test is not yet approved or cleared by the [place] FDA and  has been authorized for detection and/or diagnosis of SARS-CoV-2 by FDA under an Emergency Use Authorization (EUA). This EUA will remain  in effect (meaning this test can be used) f or the duration of the COVID-19 declaration under Section 564(b)(1) of  the Act, 21 U.S.C. section 360bbb-3(b)(1), unless the authorization is terminated or revoked sooner. Performed at [facility], [place] [address], [place], [place] [address]   Culture, blood (Routine x 2)     Status: None (Preliminary result)   Collection Time: 11/13/18  2:59 PM   Specimen: BLOOD  Result Value Ref Range Status   Specimen Description BLOOD BLOOD RIGHT ARM  Final   Special Requests   Final    BOTTLES DRAWN AEROBIC AND ANAEROBIC Blood Culture adequate volume   Culture   Final    NO GROWTH 3 DAYS Performed at [facility], [address] [place], [place] [address]    Report Status PENDING  Incomplete  Blood Culture (routine x 2)     Status: None (Preliminary result)   Collection Time: 11/13/18  3:00 PM   Specimen: BLOOD  Result Value Ref Range Status   Specimen Description BLOOD BLOOD LEFT ARM  Final   Special Requests   Final    BOTTLES DRAWN AEROBIC AND ANAEROBIC Blood Culture adequate volume   Culture   Final    NO GROWTH 3 DAYS Performed at [facility], [address], [place], [place] [address]    Report Status PENDING  Incomplete  Urine Culture     Status: Abnormal   Collection Time: 11/14/18  1:41 AM   Specimen: Urine, Random  Result Value Ref Range Status   Specimen Description   Final    URINE, RANDOM Performed at [facility], [address], [place], [place] [address]    Special Requests   Final    NONE Performed at [facility], [address] [place], [place] [address]    Culture MULTIPLE SPECIES PRESENT, SUGGEST RECOLLECTION (A)  Final   Report Status 11/15/2018 FINAL  Final    Radiology Reports Ct Head Wo Contrast  Result Date: 11/14/2018 CLINICAL DATA:  Encephalopathy EXAM: CT HEAD WITHOUT CONTRAST TECHNIQUE: Contiguous axial images were obtained from the base of the skull through the vertex without intravenous contrast. COMPARISON:  Head CT 06/06/2018 FINDINGS: Brain: There is no mass,  hemorrhage or extra-axial collection. There is generalized atrophy without lobar predilection. Hypodensity of the white matter is most commonly associated with chronic microvascular disease. Vascular: No abnormal hyperdensity of the major intracranial arteries or dural venous sinuses. No intracranial atherosclerosis. Skull: The visualized skull base, calvarium and extracranial soft tissues are normal. Sinuses/Orbits: No fluid levels or advanced mucosal thickening of the visualized paranasal sinuses. No mastoid or middle ear effusion. The orbits are normal. IMPRESSION: Generalized atrophy and chronic microvascular ischemia without acute intracranial abnormality. Electronically Signed   By: Ulyses Jarred M.D.   On: 11/14/2018 03:08   Ct Angio Chest Pe W And/or Wo Contrast  Result Date: 11/13/2018 CLINICAL DATA:  Hypoxia. Elevated D-dimer.  The patient is COVID positive EXAM: CT ANGIOGRAPHY CHEST WITH CONTRAST TECHNIQUE: Multidetector CT imaging of the chest was performed using the standard protocol during bolus administration of intravenous contrast. Multiplanar CT image reconstructions and MIPs were obtained to evaluate the vascular anatomy. CONTRAST:  57mL OMNIPAQUE IOHEXOL 350 MG/ML SOLN COMPARISON:  CT dated June 06, 2018. FINDINGS: Cardiovascular: Contrast injection is sufficient to demonstrate satisfactory opacification of the pulmonary arteries to the segmental level.There are acute bilateral pulmonary emboli. For example there is an embolus in the main right pulmonary artery extending into the right, middle, and upper lobar branches. There are acute pulmonary emboli involving the left lower lobe pulmonary artery extending into segmental and subsegmental branches. The main pulmonary artery is within normal limits for size. There is no evidence for right-sided heart strain with the RV LV ratio measuring less than 0.9. There is some reflux into the IVC consistent with some underlying cardiac dysfunction.  Atherosclerotic changes are noted throughout the thoracic aorta. Mediastinum/Nodes: --there is a 4.5 x 1.6 cm right upper paratracheal mass. This is essentially unchanged from prior CT in 2018 and is favored to represent a inferior right thyroid nodule/mass. --No axillary lymphadenopathy. --No supraclavicular lymphadenopathy. --Normal thyroid gland. --The esophagus is unremarkable Lungs/Pleura: There is scattered ground-glass airspace opacities bilaterally. There is no pneumothorax. There is no significant pleural effusion. The trachea is unremarkable. Upper Abdomen: There is cholelithiasis without CT evidence for acute cholecystitis. Musculoskeletal: There is an old healed fracture of the sternum. No new acute displaced fracture. Review of the MIP images confirms the above findings. IMPRESSION: 1. Acute bilateral pulmonary emboli as detailed above, greatest on the right. While there is no CT evidence for right heart strain, there is some reflux of contrast into the IVC consistent with some degree of right-sided heart dysfunction. 2. Bilateral ground-glass airspace opacities consistent with the patient's history of viral pneumonia. 3. There is cholelithiasis without secondary signs of acute cholecystitis. Aortic Atherosclerosis (ICD10-I70.0). These results were called by telephone at the time of interpretation on 11/13/2018 at 6:01 pm to provider [facility] , who verbally acknowledged these results. Electronically Signed   By: Constance Holster M.D.   On: 11/13/2018 18:05   Dg Chest Port 1 View  Result Date: 11/13/2018 CLINICAL DATA:  Shortness of breath.  COVID-19. EXAM: PORTABLE CHEST 1 VIEW COMPARISON:  10/23/2018 FINDINGS: The heart size and mediastinal contours are within normal limits. Both lungs are clear. The visualized skeletal structures are unremarkable. IMPRESSION: No active disease. Electronically Signed   By: Lorriane Shire M.D.   On: 11/13/2018 15:32   Dg Chest Port 1 View  Result  Date: 10/20/2018 CLINICAL DATA:  Shortness of breath.  COVID-19 virus infection. EXAM: PORTABLE CHEST 1 VIEW COMPARISON:  10/18/2018 FINDINGS: The heart size and mediastinal contours are within normal limits. Aortic atherosclerosis. Stable prominence of pulmonary interstitial markings. No evidence of pulmonary airspace disease or pleural effusion. The visualized skeletal structures are unremarkable. IMPRESSION: Stable exam.  No active disease. Electronically Signed   By: Marlaine Hind M.D.   On: 10/20/2018 12:12   Dg Chest Portable 1 View  Result Date: 10/18/2018 CLINICAL DATA:  COVID-19 positive. Diabetes. Hypertension. COPD. Bladder cancer. EXAM: PORTABLE CHEST 1 VIEW COMPARISON:  06/06/2018 FINDINGS: Two frontal views of the chest. Midline trachea. Borderline cardiomegaly. Atherosclerosis in the transverse aorta. No pleural effusion or pneumothorax. Moderate pulmonary interstitial thickening is not significantly changed. Bibasilar opacities, favored to represent scarring or subsegmental atelectasis, similar. IMPRESSION: Chronic interstitial thickening, likely  related to the clinical history of COPD. No acute superimposed process. Aortic Atherosclerosis (ICD10-I70.0). Electronically Signed   By: Abigail Miyamoto M.D.   On: 10/18/2018 18:33   Dg Chest Port 1v Same Day  Result Date: 10/23/2018 CLINICAL DATA:  Shortness of breath. History of diabetes, COPD and bladder cancer. EXAM: PORTABLE CHEST 1 VIEW COMPARISON:  Radiographs 10/20/2018 and 10/18/2018. CT 06/06/2018. FINDINGS: 1148 hours. The heart size and mediastinal contours are stable. There is aortic atherosclerosis. There is stable mild interstitial and central airway thickening which appears chronic. No superimposed airspace disease, pleural effusion or pneumothorax. And old rib fractures noted on the right. IMPRESSION: 1. Stable chronic lung disease. No acute cardiopulmonary process. 2. Aortic atherosclerosis. Electronically Signed   By: Richardean Sale M.D.   On: 10/23/2018 12:13    Time Spent in minutes  35   Jesenia Spera M.D on 11/16/2018 at 12:37 PM  Between 7am to 7pm - Pager - [phone]  After 7pm go to [email or web address] - password [facility]  [facility] -  Office  [phone]

## 2018-11-16 NOTE — Progress Notes (Addendum)
Daily Progress Note   Patient Name: Chase Simmons       Date: 11/16/2018 DOB: April 07, 1933  Age: 85 y.o. MRN#: [id number] Attending Physician: Chase Molder, MD Primary Care Physician: Chase Pitch, MD Admit Date: 11/13/2018  Reason for Consultation/Follow-up: Establishing goals of care  Subjective: Patient calm at this time resting with eyes closed.    Met daughter at bedside, she states she has 2 siblings. She and her brother are leaning toward comfort care, but her sister is not. Daughter states her sister is wanting to bring Chase Simmons home and have "monitoring equipment" in the home. Discussed QOL which is important to this daughter. Discussed his cognitive status since June which has waxed and waned, but sounds to have declined overall. Discussed his oral intake, and the ease of removing a PEG by the patient.  Answered all questions including questions on comfort care.     Length of Stay: 3  Current Medications: Scheduled Meds:  . atorvastatin  20 mg Oral q1800  . chlorhexidine  15 mL Mouth Rinse BID  . Chlorhexidine Gluconate Cloth  6 each Topical Q0600  . gabapentin  100 mg Oral QHS  . insulin aspart  0-9 Units Subcutaneous Q4H  . mouth rinse  15 mL Mouth Rinse q12n4p  . pantoprazole  40 mg Oral Daily  . PARoxetine  30 mg Oral Daily  . primidone  100 mg Oral TID  . QUEtiapine  100 mg Oral QHS  . QUEtiapine  50 mg Oral BID  . traZODone  100 mg Oral QHS    Continuous Infusions: . dextrose 20 mL/hr at 11/16/18 1200  . heparin 1,000 Units/hr (11/16/18 1200)  . magnesium sulfate bolus IVPB      PRN Meds: fentaNYL (SUBLIMAZE) injection, haloperidol lactate, LORazepam, polyethylene glycol  Physical Exam Constitutional:      Appearance: He is cachectic.   Pulmonary:     Effort: No tachypnea, accessory muscle usage or respiratory distress.  Skin:    General: Skin is warm and dry.     Coloration: Skin is pale.  Neurological:     Mental Status: He is easily aroused.     Comments: Resting with eyes closed.            Vital Signs: BP (!) 158/72   Pulse 76   Temp  98.3 F (36.8 C) (Axillary)   Resp 19   Ht 5' 9" (1.753 m)   Wt 76.6 kg   SpO2 100%   BMI 24.94 kg/m  SpO2: SpO2: 100 % O2 Device: O2 Device: Room Air O2 Flow Rate: O2 Flow Rate (L/min): 2 L/min  Intake/output summary:   Intake/Output Summary (Last 24 hours) at 11/16/2018 1346 Last data filed at 11/16/2018 1200 Gross per 24 hour  Intake 908.18 ml  Output 460 ml  Net 448.18 ml   LBM: Last BM Date: 11/16/18 Baseline Weight: Weight: 86.2 kg Most recent weight: Weight: 76.6 kg       Palliative Assessment/Data: PPS 20%      Patient Active Problem List   Diagnosis Date Noted  . Pressure injury of skin 11/14/2018  . Palliative care by specialist   . Hypotension   . Agitation   . Dementia with behavioral disturbance (Ch[facility] Simmons)   . Pulmonary embolism (Chase Simmons) 11/13/2018  . Acute massive pulmonary embolism ([facility]ase Simmons) 11/13/2018  . COVID-19 10/19/2018  . COVID-19 virus infection 10/18/2018  . Weakness 06/07/2018  . Skin tear of elbow without complication 34/28/7681  . Lactic acidosis 06/06/2018  . Depression 04/20/2018  . Altered mental status   . Frequency of urination 03/01/2018  . Confusion 03/01/2018  . Bilateral leg edema 11/08/2016  . Vertigo 08/25/2016  . Nausea 08/01/2016  . CHF (congestive heart failure) ([place]) 08/01/2016  . Atypical chest pain 07/20/2016  . COPD (chronic obstructive pulmonary disease) ([place]) 03/15/2016  . Coronary atherosclerosis of native coronary artery 01/05/2016  . Aortic atherosclerosis ([place]) 01/05/2016  . Bradycardia 01/05/2016  . Anxiety 06/17/2015  . Malignant neoplasm of urinary bladder (Chase Simmons) 06/17/2015  . Allergic rhinitis  05/14/2015  . Knee pain, bilateral 03/16/2015  . PAF (paroxysmal atrial fibrillation) ([place]) 08/30/2013  . Acid reflux 08/30/2013  . Goals of care, counseling/discussion 03/04/2013  . Tremor, essential 10/22/2012  . Hyperlipidemia 10/22/2012  . HTN (hypertension) 08/01/2012  . Type 2 diabetes mellitus with complication, without long-term current use of insulin ([place]) 08/01/2012  . Benign prostatic hyperplasia with urinary obstruction 09/02/2011    Palliative Care Assessment & Plan   Patient Profile: 85 y.o. male  with past medical history of dementia, chronic systolic heart failure, paroxysmal atrial fibrillation (not on anticoagulation), HTN, COPD, type 2 DM, recent covid admission at [facility] 10/15-10/21 admitted on 11/13/2018 with AMS and hypoxia. CT angiogram of chest showed bilateral acute PE with significant clot burden. Vascular surgery consulted and patient underwent thrombolysis in bilateral pulmonary artery with TPA. Hypotensive on levophed. Palliative medicine consultation for goals of care.   Assessment: Acute respiratory failure with hypoxemia Massive bilateral pulmonary embolism s/p thrombolysis AKI Hypernatremia Dementia with severe delirium Chronic systolic CHF Paroxysmal atrial fibrillation COPD Adult failure to thrive  Recommendations/Plan:  Extensive time spent discussing [facility] with daughter.  No documented HCPOA. Patient's children make joint decisions. [facility], [facility])  DNR/DNI. Otherwise FULL scope treatment. Children wish for all offered and available medical interventions to prolong life including feeding tube placement if necessary. Children feel he needs more time and hopeful for improvement.   MOST form completed with adult children previously. DNR/DNI, limited additional interventions including CPAP/BiPAP if indicated, IVF/ABX if indicated, and YES to feeding tube for time trial.   Most importantly, children do NOT want him to return to SNF but desire to  take him home following hospitalization.   Psych consult for medication management.  Ongoing palliative discussions pending clinical course. High risk for decline.  Goals of Care and Additional Recommendations:  Limitations on Scope of Treatment: Full Scope Treatment  Code Status: DNR/DNI   Code Status Orders  (From admission, onward)         Start     Ordered   11/15/18 1248  Do not attempt resuscitation (DNR)  Continuous    Question Answer Comment  In the event of cardiac or respiratory ARREST Do not call a "code blue"   In the event of cardiac or respiratory ARREST Do not perform Intubation, CPR, defibrillation or ACLS   In the event of cardiac or respiratory ARREST Use medication by any route, position, wound care, and other measures to relive pain and suffering. May use oxygen, suction and manual treatment of airway obstruction as needed for comfort.      11/15/18 1247        Code Status History    Date Active Date Inactive Code Status Order ID Comments User Context   11/13/2018 2001 11/15/2018 1247 Full Code [id number]  Orene Desanctis, DO ED   10/20/2018 0606 10/24/2018 1353 DNR [id number]  Phillips Grout, MD Inpatient   10/19/2018 0319 10/20/2018 0551 DNR [id number]  Lance Coon, MD ED   06/06/2018 2015 06/13/2018 1644 DNR [id number]  Hillary Bow, MD ED   08/25/2016 0304 08/26/2016 2304 Full Code [id number]  Harrie Foreman, MD Inpatient   07/21/2016 0049 07/25/2016 2010 Full Code [id number]  Hugelmeyer, [place], DO Inpatient   07/10/2016 0934 07/13/2016 1719 Full Code [id number]  Saundra Shelling, MD Inpatient   06/19/2015 0021 06/23/2015 0047 Full Code [id number]  Alesia Richards, MD ED   06/04/2015 2013 06/08/2015 1833 Full Code [id number]  Loletha Grayer, MD ED   Advance Care Planning Activity    Advance Directive Documentation     Most Recent Value  Type of Advance Directive  Out of facility DNR (pink MOST or yellow form)  Pre-existing out of facility DNR order (yellow form or  pink MOST form)  -  "MOST" Form in Place?  -       Prognosis:  Guarded  Discharge Planning:  To Be Determined  Thank you for allowing the Palliative Medicine Team to assist in the care of this patient.   Total Time 38 min Prolonged Time Billed  no      Greater than 50%  of this time was spent counseling and coordinating care related to the above assessment and plan.    Please contact Palliative Medicine Team phone at [phone] for questions and concerns.

## 2018-11-16 NOTE — Progress Notes (Signed)
ANTICOAGULATION CONSULT NOTE:  Pharmacy Consult for Heparin  Indication: pulmonary embolus  Allergies  Allergen Reactions  . Latex Itching   Patient Measurements: Height: 5\' 9"  (175.3 cm) Weight: 168 lb 14 oz (76.6 kg) IBW/kg (Calculated) : 70.7  Use TBW for heparin dosing.  Vital Signs: Temp: 98.3 F (36.8 C) (11/13 0740) Temp Source: Axillary (11/13 0740) BP: 90/73 (11/13 1300) Pulse Rate: 78 (11/13 1300)  Labs: Recent Labs    11/13/18 1457  11/14/18 1207  11/15/18 0607  11/15/18 1837 11/16/18 0345 11/16/18 1356  HGB 11.7*   < > 9.4*  --  8.6*  --   --  8.8*  --   HCT 36.7*   < > 29.5*  --  26.0*  --   --  27.4*  --   PLT 213   < > 179  --  181  --   --  150  --   APTT 32  --   --   --   --   --   --   --   --   LABPROT 15.1  --   --   --   --   --   --   --   --   INR 1.2  --   --   --   --   --   --   --   --   HEPARINUNFRC  --    < > 1.05*   < >  --    < > 0.37 0.31 0.30  CREATININE 1.95*   < > 1.25*  --  0.85  --   --  0.65  --   TROPONINIHS 46*  --   --   --  50*  --   --   --   --    < > = values in this interval not displayed.    Estimated Creatinine Clearance: 70 mL/min (by C-G formula based on SCr of 0.65 mg/dL).  Assessment: Pharmacy consulted to dose heparin in this 85 year old male admitted with PE.  No prior anticoagulation noted. H&H trending up, PLT WNL, baseline INR 1.2, aPTT 32s.  Heparin Course: 11/10 initiation: 6000 unit bolus, then 1500 units/hr 11/11 0402 HL 1.25: reduced to 1300 units/hr 11/11 1207 HL 1.05: hold for 1 hour and reduce rate to 1000 units/hr 11/11 2309 HL 0.74, reduce rate to 900 units/hr 11/12 0927 HL 0.55, therapeutic x 1 11/12 1837 HL 0.37, therapeutic x 2 11/13 0345 HL 0.31, inc to 1000 units/hr 11/13 1356 HL 0.30, inc to 1100 units/hr  Goal of Therapy:  Heparin level 0.3-0.7 units/ml Monitor platelets by anticoagulation protocol: Yes   Plan:   Increase heparin infusion to 1100 units/hr to discourage a drop to  subtherapeutic level.  Recheck HL in 8 hours.  Check CBC with AM labs.  Raiford [name], PharmD Candidate 11/16/2018 2:47 PM

## 2018-11-16 NOTE — TOC Initial Note (Signed)
Transition of Care [facility]) - Initial/Assessment Note    Patient Details  Name: Chase Simmons MRN: [id number] Date of Birth: [birth year]  [facility] CM/SW Contact:    Annamaria Boots, [place] Phone Number: 11/16/2018, 5:26 PM  Clinical Narrative:    Patient is at high risk for readmission. CSW spoke with patient's daughter Ancle Morelan. Daughter reports that patient has been living at [facility] since June of 2020. [name] states that their plan is not to return to [facility] but to bring patient home to live with daughter [name] and have support from all family members. Daughter reports that patient will need a wheelchair and hospital bed at discharge. She also states that they would like home health but could not remember the company they have used in the past. Daughter reports that she will find out the name of the company and call CSW back with that information. CSW answered questions about Medicaid and sitter services. CSW also answered questions about PACE program. CSW will continue to follow for discharge planning.            Expected Discharge Plan: [place] Barriers to Discharge: Continued Medical Work up   Patient Goals and CMS Choice Patient states their goals for this hospitalization and ongoing recovery are:: Retun home with daughter CMS Medicare.gov Compare Post Acute Care list provided to:: Patient Represenative (must comment)(Daughter- [name])    Expected Discharge Plan and Services Expected Discharge Plan: [place] In-house Referral: Hospice / [place], Clinical Social Work     Living arrangements for the past 2 months: [place]                                      Prior Living Arrangements/Services Living arrangements for the past 2 months: [place] Lives with:: Facility Resident Patient language and need for interpreter reviewed:: Yes Do you feel safe going back to the  place where you live?: Yes      Need for Family Participation in Patient Care: Yes (Comment) Care giver support system in place?: Yes (comment)   Criminal Activity/Legal Involvement Pertinent to Current Situation/Hospitalization: No - Comment as needed  Activities of Daily Living      Permission Sought/Granted Permission sought to share information with : Case Manager, Customer service manager, Family Supports Permission granted to share information with : Yes, Verbal Permission Granted              Emotional Assessment Appearance:: Appears stated age     Orientation: : Oriented to Self Alcohol / Substance Use: Not Applicable Psych Involvement: No (comment)  Admission diagnosis:  Acute massive pulmonary embolism ([place]) [I26.99] Patient Active Problem List   Diagnosis Date Noted  . Pressure injury of skin 11/14/2018  . Palliative care by specialist   . Hypotension   . Agitation   . Dementia with behavioral disturbance ([place])   . Pulmonary embolism ([place]) 11/13/2018  . Acute massive pulmonary embolism ([place]) 11/13/2018  . COVID-19 10/19/2018  . COVID-19 virus infection 10/18/2018  . Weakness 06/07/2018  . Skin tear of elbow without complication [id number]  . Lactic acidosis 06/06/2018  . Depression 04/20/2018  . Altered mental status   . Frequency of urination 03/01/2018  . Confusion 03/01/2018  . Bilateral leg edema 11/08/2016  . Vertigo 08/25/2016  . Nausea 08/01/2016  . CHF (congestive heart failure) ([place])  08/01/2016  . Atypical chest pain 07/20/2016  . COPD (chronic obstructive pulmonary disease) ([place]) 03/15/2016  . Coronary atherosclerosis of native coronary artery 01/05/2016  . Aortic atherosclerosis ([place]) 01/05/2016  . Bradycardia 01/05/2016  . Anxiety 06/17/2015  . Malignant neoplasm of urinary bladder ([place]) 06/17/2015  . Allergic rhinitis 05/14/2015  . Knee pain, bilateral 03/16/2015  . PAF (paroxysmal atrial fibrillation) ([place]) 08/30/2013  . Acid  reflux 08/30/2013  . Goals of care, counseling/discussion 03/04/2013  . Tremor, essential 10/22/2012  . Hyperlipidemia 10/22/2012  . HTN (hypertension) 08/01/2012  . Type 2 diabetes mellitus with complication, without long-term current use of insulin ([place]) 08/01/2012  . Benign prostatic hyperplasia with urinary obstruction 09/02/2011   PCP:  [name], MD Pharmacy:   [place], [place] [facility] [address] [place] [address] Phone: [phone] Fax: [phone]     Social Determinants of Health (SDOH) Interventions    Readmission Risk Interventions Readmission Risk Prevention Plan 11/16/2018 06/07/2018  Transportation Screening Complete Complete  PCP or Specialist Appt within 3-5 Days - Complete  HRI or [place] - Complete  Social Work Consult for [place] Planning/Counseling - Not Complete  Medication Review Press photographer) - Complete  PCP or Specialist appointment within 3-5 days of discharge Complete -  [place] or [place] Complete -  SW Recovery Care/Counseling Consult Complete -  Palliative Care Screening Complete -  [place] Not Applicable -  Some recent data might be hidden

## 2018-11-17 LAB — CBC
HCT: 30.1 % — ABNORMAL LOW (ref 39.0–52.0)
Hemoglobin: 9.8 g/dL — ABNORMAL LOW (ref 13.0–17.0)
MCH: 34.4 pg — ABNORMAL HIGH (ref 26.0–34.0)
MCHC: 32.6 g/dL (ref 30.0–36.0)
MCV: 105.6 fL — ABNORMAL HIGH (ref 80.0–100.0)
Platelets: 177 10*3/uL (ref 150–400)
RBC: 2.85 MIL/uL — ABNORMAL LOW (ref 4.22–5.81)
RDW: 14.6 % (ref 11.5–15.5)
WBC: 6.4 10*3/uL (ref 4.0–10.5)
nRBC: 0 % (ref 0.0–0.2)

## 2018-11-17 LAB — BASIC METABOLIC PANEL
Anion gap: 7 (ref 5–15)
BUN: 20 mg/dL (ref 8–23)
CO2: 22 mmol/L (ref 22–32)
Calcium: 7.8 mg/dL — ABNORMAL LOW (ref 8.9–10.3)
Chloride: 119 mmol/L — ABNORMAL HIGH (ref 98–111)
Creatinine, Ser: 0.89 mg/dL (ref 0.61–1.24)
GFR calc Af Amer: >60 mL/min (ref >60)
GFR calc non Af Amer: >60 mL/min (ref >60)
Glucose, Bld: 135 mg/dL — ABNORMAL HIGH (ref 70–99)
Potassium: 3.4 mmol/L — ABNORMAL LOW (ref 3.5–5.1)
Sodium: 148 mmol/L — ABNORMAL HIGH (ref 135–145)

## 2018-11-17 LAB — GLUCOSE, CAPILLARY
Glucose-Capillary: 112 mg/dL — ABNORMAL HIGH (ref 70–99)
Glucose-Capillary: 118 mg/dL — ABNORMAL HIGH (ref 70–99)
Glucose-Capillary: 121 mg/dL — ABNORMAL HIGH (ref 70–99)
Glucose-Capillary: 129 mg/dL — ABNORMAL HIGH (ref 70–99)
Glucose-Capillary: 132 mg/dL — ABNORMAL HIGH (ref 70–99)
Glucose-Capillary: 133 mg/dL — ABNORMAL HIGH (ref 70–99)

## 2018-11-17 LAB — HEPARIN LEVEL (UNFRACTIONATED)
Heparin Unfractionated: 0.26 IU/mL — ABNORMAL LOW (ref 0.30–0.70)
Heparin Unfractionated: 0.49 IU/mL (ref 0.30–0.70)
Heparin Unfractionated: 0.5 IU/mL (ref 0.30–0.70)

## 2018-11-17 NOTE — Progress Notes (Signed)
ANTICOAGULATION CONSULT NOTE:  Pharmacy Consult for Heparin  Indication: pulmonary embolus  Allergies  Allergen Reactions  . Latex Itching   Patient Measurements: Height: 5\' 9"  (175.3 cm) Weight: 164 lb 6.4 oz (74.6 kg) IBW/kg (Calculated) : 70.7  Use TBW for heparin dosing.  Vital Signs: Temp: 98.4 F (36.9 C) (11/14 0855) Temp Source: Oral (11/14 0855) BP: 137/91 (11/14 0855) Pulse Rate: 54 (11/14 0855)  Labs: Recent Labs    11/15/18 0607  11/16/18 0345 11/16/18 1356 11/17/18 0012 11/17/18 0547 11/17/18 0743 11/17/18 1131  HGB 8.6*  --  8.8*  --   --   --  9.8*  --   HCT 26.0*  --  27.4*  --   --   --  30.1*  --   PLT 181  --  150  --   --   --  177  --   HEPARINUNFRC  --    < > 0.31 0.30 0.26*  --   --  0.49  CREATININE 0.85  --  0.65  --   --  0.89  --   --   TROPONINIHS 50*  --   --   --   --   --   --   --    < > = values in this interval not displayed.    Estimated Creatinine Clearance: 62.9 mL/min (by C-G formula based on SCr of 0.89 mg/dL).  Assessment: Pharmacy consulted to dose heparin in this 85 year old male admitted with PE.  No prior anticoagulation noted. H&H trending up, PLT WNL, baseline INR 1.2, aPTT 32s.  Heparin Course: 11/10 initiation: 6000 unit bolus, then 1500 units/hr 11/11 0402 HL 1.25: reduced to 1300 units/hr 11/11 1207 HL 1.05: hold for 1 hour and reduce rate to 1000 units/hr 11/11 2309 HL 0.74, reduce rate to 900 units/hr 11/12 0927 HL 0.55, therapeutic x 1 11/12 1837 HL 0.37, therapeutic x 2 11/13 0345 HL 0.31, inc to 1000 units/hr 11/13 1356 HL 0.30, inc to 1100 units/hr 11/14 0012 HL 0.26, inc to 1250 units/hr 11/14 1131 HL 0.49, therapeutic x1  Goal of Therapy:  Heparin level 0.3-0.7 units/ml Monitor platelets by anticoagulation protocol: Yes   Plan:   Continue heparin drip at current rate (1250 units/hr)   Recheck HL in 8 hours.  Check CBC with AM labs.  Continue to monitor for s/sx of bleeding - Hgb and plt  count stable/improved  Pharmacy will continue to follow.   Rocky Morel, PharmD, BCPS 11/17/2018 11:57 AM

## 2018-11-17 NOTE — Progress Notes (Signed)
ANTICOAGULATION CONSULT NOTE:  Pharmacy Consult for Heparin  Indication: pulmonary embolus  Allergies  Allergen Reactions  . Latex Itching   Patient Measurements: Height: 5\' 9"  (175.3 cm) Weight: 164 lb 6.4 oz (74.6 kg) IBW/kg (Calculated) : 70.7  Use TBW for heparin dosing.  Vital Signs: Temp: 98.2 F (36.8 C) (11/13 2144) Temp Source: Oral (11/13 2144) BP: 139/85 (11/13 2144) Pulse Rate: 98 (11/14 0117)  Labs: Recent Labs    11/14/18 1207  11/15/18 0607  11/16/18 0345 11/16/18 1356 11/17/18 0012  HGB 9.4*  --  8.6*  --  8.8*  --   --   HCT 29.5*  --  26.0*  --  27.4*  --   --   PLT 179  --  181  --  150  --   --   HEPARINUNFRC 1.05*   < >  --    < > 0.31 0.30 0.26*  CREATININE 1.25*  --  0.85  --  0.65  --   --   TROPONINIHS  --   --  50*  --   --   --   --    < > = values in this interval not displayed.    Estimated Creatinine Clearance: 70 mL/min (by C-G formula based on SCr of 0.65 mg/dL).  Assessment: Pharmacy consulted to dose heparin in this 85 year old male admitted with PE.  No prior anticoagulation noted. H&H trending up, PLT WNL, baseline INR 1.2, aPTT 32s.  Heparin Course: 11/10 initiation: 6000 unit bolus, then 1500 units/hr 11/11 0402 HL 1.25: reduced to 1300 units/hr 11/11 1207 HL 1.05: hold for 1 hour and reduce rate to 1000 units/hr 11/11 2309 HL 0.74, reduce rate to 900 units/hr 11/12 0927 HL 0.55, therapeutic x 1 11/12 1837 HL 0.37, therapeutic x 2 11/13 0345 HL 0.31, inc to 1000 units/hr 11/13 1356 HL 0.30, inc to 1100 units/hr 11/14 0012 HL 0.26, inc to 1250 units/hr  Goal of Therapy:  Heparin level 0.3-0.7 units/ml Monitor platelets by anticoagulation protocol: Yes   Plan:   Increase heparin infusion to 1250 units/hr to discourage a drop to subtherapeutic level.  Recheck HL in 8 hours.  Check CBC with AM labs.  Ena Dawley, PharmD  11/17/2018 2:02 AM

## 2018-11-17 NOTE — Progress Notes (Signed)
ANTICOAGULATION CONSULT NOTE:  Pharmacy Consult for Heparin  Indication: pulmonary embolus  Allergies  Allergen Reactions  . Latex Itching   Patient Measurements: Height: 5\' 9"  (175.3 cm) Weight: 164 lb 6.4 oz (74.6 kg) IBW/kg (Calculated) : 70.7  Use TBW for heparin dosing.  Vital Signs: Temp: 98.2 F (36.8 C) (11/14 2004) Temp Source: Oral (11/14 2004) BP: 147/52 (11/14 2004) Pulse Rate: 88 (11/14 2004)  Labs: Recent Labs    11/15/18 0607  11/16/18 0345  11/17/18 0012 11/17/18 0547 11/17/18 0743 11/17/18 1131 11/17/18 1938  HGB 8.6*  --  8.8*  --   --   --  9.8*  --   --   HCT 26.0*  --  27.4*  --   --   --  30.1*  --   --   PLT 181  --  150  --   --   --  177  --   --   HEPARINUNFRC  --    < > 0.31   < > 0.26*  --   --  0.49 0.50  CREATININE 0.85  --  0.65  --   --  0.89  --   --   --   TROPONINIHS 50*  --   --   --   --   --   --   --   --    < > = values in this interval not displayed.    Estimated Creatinine Clearance: 62.9 mL/min (by C-G formula based on SCr of 0.89 mg/dL).  Assessment: Pharmacy consulted to dose heparin in this 85 year old male admitted with PE.  No prior anticoagulation noted. H&H trending up, PLT WNL, baseline INR 1.2, aPTT 32s.  Heparin Course: 11/10 initiation: 6000 unit bolus, then 1500 units/hr 11/11 0402 HL 1.25: reduced to 1300 units/hr 11/11 1207 HL 1.05: hold for 1 hour and reduce rate to 1000 units/hr 11/11 2309 HL 0.74, reduce rate to 900 units/hr 11/12 0927 HL 0.55, therapeutic x 1 11/12 1837 HL 0.37, therapeutic x 2 11/13 0345 HL 0.31, inc to 1000 units/hr 11/13 1356 HL 0.30, inc to 1100 units/hr 11/14 0012 HL 0.26, inc to 1250 units/hr 11/14 1131 HL 0.49, therapeutic x1 11/14 1938 HL 0.50, therapeutic x 2  Goal of Therapy:  Heparin level 0.3-0.7 units/ml Monitor platelets by anticoagulation protocol: Yes   Plan:   Continue heparin drip at current rate (1250 units/hr)   Check CBC/HL with AM labs.  Continue to  monitor for s/sx of bleeding - Hgb and plt count stable/improved  Pharmacy will continue to follow.   Lu Duffel, PharmD, BCPS 11/17/2018 8:14 PM

## 2018-11-17 NOTE — Progress Notes (Signed)
PROGRESS NOTE  XSAVIOR AUPPERLE [id number] DOB: 12-16-1933 DOA: 11/13/2018 PCP: [name], MD   LOS: 4 days   Brief Narrative / Interim history: 85 year old male with advanced dementia, chronic systolic CHF, paroxysmal A. fib not on anticoagulation due to being a poor candidate, COPD, type 2 diabetes mellitus, recent admission for COVID-19 infection (10/15-10/21), who came into the hospital and was admitted on 11/13/2018 with altered mental status, hypoxia, hypotension.  CT angiogram on admission showed bilateral acute PEs with significant clot burden.  Patient was admitted to the ICU, vascular surgery was consulted and he underwent thrombolysis with bilateral pulmonary artery TPA.  He improved post procedure, and he was transferred to the floor on 11/16/2018  Subjective / 24h Interval events: Sleepy this morning, confused, moves all 4 extremities without purpose and picking things in the air.  Mittens are on  Assessment & Plan: Principal Problem Acute respiratory failure with hypoxemia secondary to of massive bilateral pulmonary embolism, shock -Status post ICU stay, vascular surgery consulted and underwent thrombolysis -Required Levophed temporarily, now off and stable on the floor -Currently patient is on IV heparin infusion.  He is unable to take p.o. and unable to transition to oral agents.  Vascular surgery recommended IVC filter if patient was unable to take p.o. eventually, and they will follow-up as an outpatient  Active Problems Dementia with severe delirium -Underlying dementia, had ICU delirium.  He was briefly on Precedex on 11/11 and became bradycardic.  Psychiatry was consulted. -Currently on Haldol, Ativan as needed along with Seroquel, trazodone, Paxil however has poor p.o. intake  Hypernatremia -Secondary to dehydration, currently on dextrose infusion at low-dose to prevent fluid overload.  Sodium 148 currently.  We will continue to closely monitor.  Chronic  systolic CHF (EF [id number] on 01/16/2017) -Initially hypovolemia requiring IV fluids and pressors, remains euvolemic, on very low-dose 20 cc an hour of dextrose infusion, continue to closely monitor sodium and volume status.  He is on room air satting 96%  Paroxysmal A. fib -Rate controlled, not a candidate for anticoagulation however will need blood thinners for the PE as detailed above  Type 2 diabetes mellitus -Continue gabapentin for neuropathy -Oral hypoglycemics on hold, continue sliding scale  CBG (last 3)  Recent Labs    11/17/18 0042 11/17/18 0354 11/17/18 0853  GLUCAP 133* 112* 132*   Hypertension -Blood pressure stable  Hyperlipidemia -Continue atorvastatin  COPD -Stable, no wheezing  Nonobstructive CAD (by [facility] July 2018) -Asymptomatic  Essential tremor -Continue primidone.  Goals of care -Extensive discussion with patient's daughter [name] at bedside today.  We discussed all aspects of Mr. Placide's care, medical problems and trajectory over the last couple of months.  He has been having dementia for quite some time and had a progressive decline starting in January 2020.  He got significantly worse around the time when Covid pandemic started.  He has required several days with his PCP and medications need to be added or increased confusion, agitation, delirium and progressive dementia.  He was doing fairly well from our standpoint up until last month, sometimes recognizing family members, eating without significant difficulties.  Unfortunately was hospitalized with Covid last month and went to a skilled nursing in the interim prior to being here.  We were unable to visit and she is not entirely sure how his mental state/agitation/p.o. intake/physical activity has been in the 3 weeks that he has been there.  During this hospital stay he has been sleeping and confused most of the time  with intermittent agitation had minimal nonverbal acknowledgment of family members on few  occasions and at random times.  She is quite conflicted as she would favor a more palliative approach however her other sister, [name] is wanting feeding tubes and aggressive care and so on.  Family will discuss over the weekend and see how he progresses, and will have a goals of care meeting with myself and all children around 2 PM.  Will attempt to involve palliative care   Scheduled Meds:  atorvastatin  20 mg Oral q1800   chlorhexidine  15 mL Mouth Rinse BID   Chlorhexidine Gluconate Cloth  6 each Topical Q0600   gabapentin  100 mg Oral QHS   insulin aspart  0-9 Units Subcutaneous Q4H   mouth rinse  15 mL Mouth Rinse q12n4p   pantoprazole  40 mg Oral Daily   PARoxetine  30 mg Oral Daily   primidone  100 mg Oral TID   QUEtiapine  100 mg Oral QHS   QUEtiapine  50 mg Oral BID   traZODone  100 mg Oral QHS   Continuous Infusions:  dextrose 20 mL/hr at 11/17/18 [id number]   heparin 1,250 Units/hr (11/17/18 0631)   PRN Meds:.fentaNYL (SUBLIMAZE) injection, haloperidol lactate, LORazepam, polyethylene glycol  DVT prophylaxis: Heparin infusion Code Status: DNR Family Communication: Discussed with daughter at bedside Disposition Plan: To be determined  Consultants:  Vascular surgery Critical care  Procedures:  TPA  Microbiology  SARS-CoV-2 11/13/2018-positive Urine culture 11/14/2018-multiple species Blood cultures 11/13/2018-no growth  Antimicrobials: None     Objective: Vitals:   11/16/18 2144 11/17/18 0117 11/17/18 0443 11/17/18 0855  BP: 139/85  109/85 (!) 137/91  Pulse: 79 98 86 (!) 54  Resp: (!) 24 19  20   Temp: 98.2 F (36.8 C)  98.4 F (36.9 C) 98.4 F (36.9 C)  TempSrc: Oral  Oral Oral  SpO2: 100%  97% 96%  Weight: 74.6 kg     Height:        Intake/Output Summary (Last 24 hours) at 11/17/2018 1035 Last data filed at 11/17/2018 [id number] Gross per 24 hour  Intake 787.66 ml  Output 660 ml  Net 127.66 ml   Filed Weights   11/13/18 1454 11/13/18  2350 11/16/18 2144  Weight: 86.2 kg 76.6 kg 74.6 kg    Examination:  Constitutional: Confused, mittens on, picking at things in the air Eyes: lids and conjunctivae normal, no scleral icterus ENMT: Mucous membranes are moist.  Neck: normal, supple Respiratory: Diminished at the bases on anterior lung fields, no wheezing heard.  No crackles. Cardiovascular: Irregular, no murmurs appreciated, no peripheral edema Abdomen: no tenderness. Bowel sounds positive.  Musculoskeletal: no clubbing / cyanosis.  Skin: no rashes Neurologic: Does not follow commands but moves all 4 extremities nonpurposefully.  Strength appears equal   Data Reviewed: I have independently reviewed following labs and imaging studies   CBC: Recent Labs  Lab 11/13/18 1457 11/14/18 0000 11/14/18 1207 11/15/18 0607 11/16/18 0345 11/17/18 0743  WBC 9.1 10.7* 11.2* 9.6 6.5 6.4  NEUTROABS 6.7 8.4*  --   --   --   --   HGB 11.7* 10.0* 9.4* 8.6* 8.8* 9.8*  HCT 36.7* 30.9* 29.5* 26.0* 27.4* 30.1*  MCV 108.9* 105.5* 107.7* 103.6* 106.2* 105.6*  PLT 213 188 179 181 150 [id number]   Basic Metabolic Panel: Recent Labs  Lab 11/14/18 0000 11/14/18 1207 11/15/18 0607 11/16/18 0345 11/17/18 0547  NA 153* 155* 151* 152* 148*  K 3.7 3.7 3.5 3.7 3.4*  CL 120* 124* 124* 122* 119*  CO2 23 22 22 22 22   GLUCOSE 117* 106* 103* 122* 135*  BUN 58* 53* 36* 28* 20  CREATININE 1.75* 1.25* 0.85 0.65 0.89  CALCIUM 7.9* 7.7* 7.7* 8.0* 7.8*  MG 1.6*  --  1.5* 1.7  --   PHOS 5.3*  --   --  2.0*  --    Liver Function Tests: Recent Labs  Lab 11/13/18 1457 11/14/18 1207  AST 22 21  ALT 16 12  ALKPHOS 85 73  BILITOT 0.8 0.7  PROT 7.1 5.9*  ALBUMIN 3.1* 2.5*   Coagulation Profile: Recent Labs  Lab 11/13/18 1457  INR 1.2   HbA1C: No results for input(s): HGBA1C in the last 72 hours. CBG: Recent Labs  Lab 11/16/18 1549 11/16/18 1915 11/17/18 0042 11/17/18 0354 11/17/18 0853  GLUCAP 130* 114* 133* 112* 132*    Recent  Results (from the past 240 hour(s))  SARS CORONAVIRUS 2 (TAT 6-24 HRS) Nasopharyngeal Nasopharyngeal Swab     Status: Abnormal   Collection Time: 11/13/18  2:57 PM   Specimen: Nasopharyngeal Swab  Result Value Ref Range Status   SARS Coronavirus 2 POSITIVE (A) NEGATIVE Final    Comment: RESULT CALLED TO, READ BACK BY AND VERIFIED WITH: M. CAMPBELL,RN 0146 11/14/2018 T. TYSOR (NOTE) SARS-CoV-2 target nucleic acids are DETECTED. The SARS-CoV-2 RNA is generally detectable in upper and lower respiratory specimens during the acute phase of infection. Positive results are indicative of active infection with SARS-CoV-2. Clinical  correlation with patient history and other diagnostic information is necessary to determine patient infection status. Positive results do  not rule out bacterial infection or co-infection with other viruses. The expected result is Negative. Fact Sheet for Patients: SugarRoll.be Fact Sheet for Healthcare Providers: https://www.woods-mathews.com/ This test is not yet approved or cleared by the [place] FDA and  has been authorized for detection and/or diagnosis of SARS-CoV-2 by FDA under an Emergency Use Authorization (EUA). This EUA will remain  in effect (meaning this test can be used) f or the duration of the COVID-19 declaration under Section 564(b)(1) of the Act, 21 U.S.C. section 360bbb-3(b)(1), unless the authorization is terminated or revoked sooner. Performed at [facility], [address] [place], [place] [address]   Culture, blood (Routine x 2)     Status: None (Preliminary result)   Collection Time: 11/13/18  2:59 PM   Specimen: BLOOD  Result Value Ref Range Status   Specimen Description BLOOD BLOOD RIGHT ARM  Final   Special Requests   Final    BOTTLES DRAWN AEROBIC AND ANAEROBIC Blood Culture adequate volume   Culture   Final    NO GROWTH 4 DAYS Performed at [facility], [address] [place], [place] [address]    Report Status PENDING  Incomplete  Blood Culture (routine x 2)     Status: None (Preliminary result)   Collection Time: 11/13/18  3:00 PM   Specimen: BLOOD  Result Value Ref Range Status   Specimen Description BLOOD BLOOD LEFT ARM  Final   Special Requests   Final    BOTTLES DRAWN AEROBIC AND ANAEROBIC Blood Culture adequate volume   Culture   Final    NO GROWTH 4 DAYS Performed at [facility], [address] [place],  [address]    Report Status PENDING  Incomplete  Urine Culture     Status: Abnormal   Collection Time: 11/14/18  1:41 AM   Specimen: Urine,  Random  Result Value Ref Range Status   Specimen Description   Final    URINE, RANDOM Performed at [facility], [address], [place], [place] [address]    Special Requests   Final    NONE Performed at [facility], [address] [place], [place] [address]    Culture MULTIPLE SPECIES PRESENT, SUGGEST RECOLLECTION (A)  Final   Report Status 11/15/2018 FINAL  Final     Radiology Studies: No results found.  Time spent: 45 minutes, more than 20 minutes in the room in direct discussion with the patient's daughter regarding goals of care and as detailed above  Marzetta Board, MD, PhD [facility]  Between 7 am - 7 pm I am available Contact me via Amion or Securechat  Between 7 pm - 7 am I am not available Contact night coverage MD/APP via [employer]

## 2018-11-18 LAB — CBC
HCT: 28.9 % — ABNORMAL LOW (ref 39.0–52.0)
Hemoglobin: 9.2 g/dL — ABNORMAL LOW (ref 13.0–17.0)
MCH: 33.8 pg (ref 26.0–34.0)
MCHC: 31.8 g/dL (ref 30.0–36.0)
MCV: 106.3 fL — ABNORMAL HIGH (ref 80.0–100.0)
Platelets: 180 10*3/uL (ref 150–400)
RBC: 2.72 MIL/uL — ABNORMAL LOW (ref 4.22–5.81)
RDW: 14.8 % (ref 11.5–15.5)
WBC: 6.4 10*3/uL (ref 4.0–10.5)
nRBC: 0.5 % — ABNORMAL HIGH (ref 0.0–0.2)

## 2018-11-18 LAB — GLUCOSE, CAPILLARY
Glucose-Capillary: 105 mg/dL — ABNORMAL HIGH (ref 70–99)
Glucose-Capillary: 116 mg/dL — ABNORMAL HIGH (ref 70–99)
Glucose-Capillary: 124 mg/dL — ABNORMAL HIGH (ref 70–99)
Glucose-Capillary: 127 mg/dL — ABNORMAL HIGH (ref 70–99)
Glucose-Capillary: 128 mg/dL — ABNORMAL HIGH (ref 70–99)
Glucose-Capillary: 135 mg/dL — ABNORMAL HIGH (ref 70–99)

## 2018-11-18 LAB — BASIC METABOLIC PANEL
Anion gap: 7 (ref 5–15)
BUN: 17 mg/dL (ref 8–23)
CO2: 22 mmol/L (ref 22–32)
Calcium: 7.9 mg/dL — ABNORMAL LOW (ref 8.9–10.3)
Chloride: 120 mmol/L — ABNORMAL HIGH (ref 98–111)
Creatinine, Ser: 0.79 mg/dL (ref 0.61–1.24)
GFR calc Af Amer: >60 mL/min (ref >60)
GFR calc non Af Amer: >60 mL/min (ref >60)
Glucose, Bld: 120 mg/dL — ABNORMAL HIGH (ref 70–99)
Potassium: 3.3 mmol/L — ABNORMAL LOW (ref 3.5–5.1)
Sodium: 149 mmol/L — ABNORMAL HIGH (ref 135–145)

## 2018-11-18 LAB — HEPARIN LEVEL (UNFRACTIONATED): Heparin Unfractionated: 0.5 IU/mL (ref 0.30–0.70)

## 2018-11-18 MED ORDER — MORPHINE SULFATE (PF) 2 MG/ML IV SOLN
1.0000 mg | Freq: Once | INTRAVENOUS | Status: AC
  Administered 2018-11-18: 1 mg via INTRAVENOUS
  Filled 2018-11-18: qty 1

## 2018-11-18 NOTE — Progress Notes (Signed)
ANTICOAGULATION CONSULT NOTE:  Pharmacy Consult for Heparin  Indication: pulmonary embolus  Allergies  Allergen Reactions  . Latex Itching   Patient Measurements: Height: 5\' 9"  (175.3 cm) Weight: 163 lb 9.6 oz (74.2 kg) IBW/kg (Calculated) : 70.7  Use TBW for heparin dosing.  Vital Signs: Temp: 98.1 F (36.7 C) (11/15 0409) Temp Source: Oral (11/15 0409) BP: 152/97 (11/15 0409) Pulse Rate: 99 (11/15 0409)  Labs: Recent Labs    11/16/18 0345  11/17/18 0547 11/17/18 0743 11/17/18 1131 11/17/18 1938 11/18/18 0531  HGB 8.8*  --   --  9.8*  --   --  9.2*  HCT 27.4*  --   --  30.1*  --   --  28.9*  PLT 150  --   --  177  --   --  180  HEPARINUNFRC 0.31   < >  --   --  0.49 0.50 0.50  CREATININE 0.65  --  0.89  --   --   --  0.79   < > = values in this interval not displayed.    Estimated Creatinine Clearance: 70 mL/min (by C-G formula based on SCr of 0.79 mg/dL).  Assessment: Pharmacy consulted to dose heparin in this 85 year old male admitted with PE.  No prior anticoagulation noted. H&H trending up, PLT WNL, baseline INR 1.2, aPTT 32s.  Heparin Course: 11/10 initiation: 6000 unit bolus, then 1500 units/hr 11/11 0402 HL 1.25: reduced to 1300 units/hr 11/11 1207 HL 1.05: hold for 1 hour and reduce rate to 1000 units/hr 11/11 2309 HL 0.74, reduce rate to 900 units/hr 11/12 0927 HL 0.55, therapeutic x 1 11/12 1837 HL 0.37, therapeutic x 2 11/13 0345 HL 0.31, inc to 1000 units/hr 11/13 1356 HL 0.30, inc to 1100 units/hr 11/14 0012 HL 0.26, inc to 1250 units/hr 11/14 1131 HL 0.49, therapeutic x1 11/14 1938 HL 0.50, therapeutic x 2 11/15 0531 HL 0.50, therapeutic x 3  Goal of Therapy:  Heparin level 0.3-0.7 units/ml Monitor platelets by anticoagulation protocol: Yes   Plan:   Continue heparin drip at current rate (1250 units/hr)   Check CBC/HL with AM labs.  Continue to monitor for s/sx of bleeding - Hgb and plt count stable/improved  Pharmacy will continue  to follow.   Ena Dawley, PharmD 11/18/2018 6:44 AM

## 2018-11-18 NOTE — Progress Notes (Signed)
Patient's daughter [name] at the bedside this morning. [name] asking staff to give him something to eat or drink, writer explained we have to wait for a speech consult; however, speech is not here on Sundays and at this time Mr. Freeney unfortunately is not able to pass the bedside swallow screen as he is unable to hold focus and unable to follow directions at this time. Explained the risks of aspiration. Writer moistened patient's mouth and lips; will continue to monitor.

## 2018-11-18 NOTE — Progress Notes (Signed)
PROGRESS NOTE  Chase Simmons [id number] DOB: [birth year] DOA: 11/13/2018 PCP: [name], MD   LOS: 5 days   Brief Narrative / Interim history: 85 year old male with advanced dementia, chronic systolic CHF, paroxysmal A. fib not on anticoagulation due to being a poor candidate, COPD, type 2 diabetes mellitus, recent admission for COVID-19 infection (10/15-10/21), who came into the hospital and was admitted on 11/13/2018 with altered mental status, hypoxia, hypotension.  CT angiogram on admission showed bilateral acute PEs with significant clot burden.  Patient was admitted to the ICU, vascular surgery was consulted and he underwent thrombolysis with bilateral pulmonary artery TPA.  He improved post procedure, and he was transferred to the floor on 11/16/2018  Subjective / 24h Interval events: Patient sleeping this morning, remains confused, very lethargic and difficult to wake up.  He is nonverbal at this point  Assessment & Plan: Principal Problem Acute respiratory failure with hypoxemia secondary to of massive bilateral pulmonary embolism, shock -Status post ICU stay, vascular surgery consulted and underwent thrombolysis -Required Levophed temporarily, now off and stable on the floor -Currently patient is on IV heparin infusion.  He is unable to take p.o. and unable to transition to oral agents.  Vascular surgery recommended IVC filter if patient was unable to take p.o. eventually, and they will follow-up as an outpatient  Active Problems Dementia with severe delirium -Underlying dementia, had ICU delirium.  He was briefly on Precedex on 11/11 and became bradycardic.  Psychiatry was consulted. -Currently on Haldol, Ativan as needed along with Seroquel, trazodone, Paxil however has poor p.o. intake  Hypernatremia -Secondary to dehydration, currently on dextrose infusion at low-dose to prevent fluid overload.  Sodium 148-150 today, slightly getting worse, will increase the rate  of dextrose for closely monitoring for fluid overload  Chronic systolic CHF (EF [id number] on 01/16/2017) -Initially hypovolemia requiring IV fluids and pressors, he remains euvolemic and with poor p.o. intake will increase rate of fluids  Paroxysmal A. fib -Rate controlled, not a candidate for anticoagulation however will need blood thinners for the PE as detailed above  Type 2 diabetes mellitus -Continue gabapentin for neuropathy -Oral hypoglycemics on hold, continue sliding scale  CBG (last 3)  Recent Labs    11/18/18 0007 11/18/18 0403 11/18/18 0716  GLUCAP 124* 105* 116*   Hypertension -Stable blood pressure little bit on the high side but I suspect this is his baseline  Hyperlipidemia -Continue atorvastatin  COPD -No wheezing today  Nonobstructive CAD (by [facility] July 2018) -Asymptomatic  Essential tremor -Continue primidone.  Goals of care -Extensive discussion with patient's daughter [name] at bedside 11/14.  We discussed all aspects of Mr. Chase Simmons's care, medical problems and trajectory over the last couple of months.  He has been having dementia for quite some time and had a progressive decline starting in January 2020.  He got significantly worse around the time when Covid pandemic started.  He has required several days with his PCP and medications need to be added or increased confusion, agitation, delirium and progressive dementia.  He was doing fairly well from our standpoint up until last month, sometimes recognizing family members, eating without significant difficulties.  Unfortunately was hospitalized with Covid last month and went to a skilled nursing in the interim prior to being here.  We were unable to visit and she is not entirely sure how his mental state/agitation/p.o. intake/physical activity has been in the 3 weeks that he has been there.  During this hospital stay he has been sleeping  and confused most of the time with intermittent agitation had minimal  nonverbal acknowledgment of family members on few occasions and at random times.  She is quite conflicted as she would favor a more palliative approach however her other sister, [name] is wanting feeding tubes and aggressive care and so on.  Family will discuss over the weekend and see how he progresses, and will have a goals of care meeting with myself and all children around 2 PM.  Will attempt to involve palliative care  Unfortunately patient has not received any sedating medications but he is sleeping most of the day, very difficult to arouse, not interactive   Scheduled Meds:  atorvastatin  20 mg Oral q1800   chlorhexidine  15 mL Mouth Rinse BID   Chlorhexidine Gluconate Cloth  6 each Topical Q0600   gabapentin  100 mg Oral QHS   insulin aspart  0-9 Units Subcutaneous Q4H   mouth rinse  15 mL Mouth Rinse q12n4p   pantoprazole  40 mg Oral Daily   PARoxetine  30 mg Oral Daily   primidone  100 mg Oral TID   QUEtiapine  100 mg Oral QHS   QUEtiapine  50 mg Oral BID   traZODone  100 mg Oral QHS   Continuous Infusions:  dextrose 20 mL/hr at 11/18/18 0201   heparin 1,250 Units/hr (11/18/18 0204)   PRN Meds:.haloperidol lactate, polyethylene glycol  DVT prophylaxis: Heparin infusion Code Status: DNR Family Communication: Discussed with daughter at bedside on 11/14 Disposition Plan: To be determined  Consultants:  Vascular surgery Critical care  Procedures:  TPA  Microbiology  SARS-CoV-2 11/13/2018-positive Urine culture 11/14/2018-multiple species Blood cultures 11/13/2018-no growth  Antimicrobials: None     Objective: Vitals:   11/17/18 1608 11/17/18 2004 11/18/18 0409 11/18/18 0719  BP: (!) 150/90 (!) 147/52 (!) 152/97 (!) 158/144  Pulse: (!) 101 88 99 (!) 104  Resp: 20 18 18    Temp: 98.7 F (37.1 C) 98.2 F (36.8 C) 98.1 F (36.7 C) 98.7 F (37.1 C)  TempSrc: Oral Oral Oral Oral  SpO2: 99% 100% 100% 99%  Weight:   74.2 kg   Height:         Intake/Output Summary (Last 24 hours) at 11/18/2018 1029 Last data filed at 11/18/2018 0409 Gross per 24 hour  Intake 630.7 ml  Output 350 ml  Net 280.7 ml   Filed Weights   11/13/18 2350 11/16/18 2144 11/18/18 0409  Weight: 76.6 kg 74.6 kg 74.2 kg    Examination:  Constitutional: Confused, sleeping, moans when interacted with but has no meaningful responses Eyes: No scleral icterus ENMT: Moist mucous membranes Neck: normal, supple Respiratory: Diminished at the bases, no wheezing, no crackles Cardiovascular: Irregular, no murmurs appreciated, no peripheral edema Abdomen: Nontender, bowel sounds positive Musculoskeletal: no clubbing / cyanosis.  Skin: No rashes seen Neurologic: Does not follow commands with moves all 4 extremities independently   Data Reviewed: I have independently reviewed following labs and imaging studies   CBC: Recent Labs  Lab 11/13/18 1457 11/14/18 0000 11/14/18 1207 11/15/18 0607 11/16/18 0345 11/17/18 0743 11/18/18 0531  WBC 9.1 10.7* 11.2* 9.6 6.5 6.4 6.4  NEUTROABS 6.7 8.4*  --   --   --   --   --   HGB 11.7* 10.0* 9.4* 8.6* 8.8* 9.8* 9.2*  HCT 36.7* 30.9* 29.5* 26.0* 27.4* 30.1* 28.9*  MCV 108.9* 105.5* 107.7* 103.6* 106.2* 105.6* 106.3*  PLT 213 188 179 181 150 177 [id number]   Basic Metabolic Panel:  Recent Labs  Lab 11/14/18 0000 11/14/18 1207 11/15/18 0607 11/16/18 0345 11/17/18 0547 11/18/18 0531  NA 153* 155* 151* 152* 148* 149*  K 3.7 3.7 3.5 3.7 3.4* 3.3*  CL 120* 124* 124* 122* 119* 120*  CO2 23 22 22 22 22 22   GLUCOSE 117* 106* 103* 122* 135* 120*  BUN 58* 53* 36* 28* 20 17  CREATININE 1.75* 1.25* 0.85 0.65 0.89 0.79  CALCIUM 7.9* 7.7* 7.7* 8.0* 7.8* 7.9*  MG 1.6*  --  1.5* 1.7  --   --   PHOS 5.3*  --   --  2.0*  --   --    Liver Function Tests: Recent Labs  Lab 11/13/18 1457 11/14/18 1207  AST 22 21  ALT 16 12  ALKPHOS 85 73  BILITOT 0.8 0.7  PROT 7.1 5.9*  ALBUMIN 3.1* 2.5*   Coagulation  Profile: Recent Labs  Lab 11/13/18 1457  INR 1.2   HbA1C: No results for input(s): HGBA1C in the last 72 hours. CBG: Recent Labs  Lab 11/17/18 1636 11/17/18 1959 11/18/18 0007 11/18/18 0403 11/18/18 0716  GLUCAP 129* 118* 124* 105* 116*    Recent Results (from the past 240 hour(s))  SARS CORONAVIRUS 2 (TAT 6-24 HRS) Nasopharyngeal Nasopharyngeal Swab     Status: Abnormal   Collection Time: 11/13/18  2:57 PM   Specimen: Nasopharyngeal Swab  Result Value Ref Range Status   SARS Coronavirus 2 POSITIVE (A) NEGATIVE Final    Comment: RESULT CALLED TO, READ BACK BY AND VERIFIED WITH: M. CAMPBELL,RN 0146 11/14/2018 T. TYSOR (NOTE) SARS-CoV-2 target nucleic acids are DETECTED. The SARS-CoV-2 RNA is generally detectable in upper and lower respiratory specimens during the acute phase of infection. Positive results are indicative of active infection with SARS-CoV-2. Clinical  correlation with patient history and other diagnostic information is necessary to determine patient infection status. Positive results do  not rule out bacterial infection or co-infection with other viruses. The expected result is Negative. Fact Sheet for Patients: SugarRoll.be Fact Sheet for Healthcare Providers: https://www.woods-mathews.com/ This test is not yet approved or cleared by the [place] FDA and  has been authorized for detection and/or diagnosis of SARS-CoV-2 by FDA under an Emergency Use Authorization (EUA). This EUA will remain  in effect (meaning this test can be used) f or the duration of the COVID-19 declaration under Section 564(b)(1) of the Act, 21 U.S.C. section 360bbb-3(b)(1), unless the authorization is terminated or revoked sooner. Performed at [facility], [place] [address] [place], [place] [address]   Culture, blood (Routine x 2)     Status: None (Preliminary result)   Collection Time: 11/13/18  2:59 PM   Specimen: BLOOD   Result Value Ref Range Status   Specimen Description BLOOD BLOOD RIGHT ARM  Final   Special Requests   Final    BOTTLES DRAWN AEROBIC AND ANAEROBIC Blood Culture adequate volume   Culture   Final    NO GROWTH 4 DAYS Performed at [facility], [address] [place],  [address]    Report Status PENDING  Incomplete  Blood Culture (routine x 2)     Status: None (Preliminary result)   Collection Time: 11/13/18  3:00 PM   Specimen: BLOOD  Result Value Ref Range Status   Specimen Description BLOOD BLOOD LEFT ARM  Final   Special Requests   Final    BOTTLES DRAWN AEROBIC AND ANAEROBIC Blood Culture adequate volume   Culture   Final  NO GROWTH 4 DAYS Performed at [facility], [address] [place], [place] [address]    Report Status PENDING  Incomplete  Urine Culture     Status: Abnormal   Collection Time: 11/14/18  1:41 AM   Specimen: Urine, Random  Result Value Ref Range Status   Specimen Description   Final    URINE, RANDOM Performed at [facility], [address] [place], [place] [address]    Special Requests   Final    NONE Performed at [facility], [address] [place], [place] [address]    Culture MULTIPLE SPECIES PRESENT, SUGGEST RECOLLECTION (A)  Final   Report Status 11/15/2018 FINAL  Final     Radiology Studies: No results found.  Marzetta Board, MD, PhD [facility]  Between 7 am - 7 pm I am available Contact me via Amion or Securechat  Between 7 pm - 7 am I am not available Contact night coverage MD/APP via [employer]

## 2018-11-18 NOTE — Progress Notes (Signed)
At approximately 86 daughter [name] called this RN into room to assess patient who was showing signs of moderate to severe pain (See PAINAD scale). Communicated with Dr. Cruzita Lederer who put in an order for 2mg  morphine IV. However, upon reassessment, patient was sleeping peacefully. Writer spoke with daughter [name] who agreed to hold pain medication for now as Mr. Chase Simmons is clearly not in any distress. Will continue to monitor.

## 2018-11-19 DIAGNOSIS — R1312 Dysphagia, oropharyngeal phase: Secondary | ICD-10-CM | POA: Diagnosis present

## 2018-11-19 DIAGNOSIS — Z7189 Other specified counseling: Secondary | ICD-10-CM

## 2018-11-19 LAB — COMPREHENSIVE METABOLIC PANEL
ALT: 11 U/L (ref 0–44)
AST: 17 U/L (ref 15–41)
Albumin: 2.2 g/dL — ABNORMAL LOW (ref 3.5–5.0)
Alkaline Phosphatase: 71 U/L (ref 38–126)
Anion gap: 9 (ref 5–15)
BUN: 13 mg/dL (ref 8–23)
CO2: 20 mmol/L — ABNORMAL LOW (ref 22–32)
Calcium: 7.7 mg/dL — ABNORMAL LOW (ref 8.9–10.3)
Chloride: 115 mmol/L — ABNORMAL HIGH (ref 98–111)
Creatinine, Ser: 0.75 mg/dL (ref 0.61–1.24)
GFR calc Af Amer: >60 mL/min (ref >60)
GFR calc non Af Amer: >60 mL/min (ref >60)
Glucose, Bld: 161 mg/dL — ABNORMAL HIGH (ref 70–99)
Potassium: 3.1 mmol/L — ABNORMAL LOW (ref 3.5–5.1)
Sodium: 144 mmol/L (ref 135–145)
Total Bilirubin: 1 mg/dL (ref 0.3–1.2)
Total Protein: 5.3 g/dL — ABNORMAL LOW (ref 6.5–8.1)

## 2018-11-19 LAB — GLUCOSE, CAPILLARY
Glucose-Capillary: 105 mg/dL — ABNORMAL HIGH (ref 70–99)
Glucose-Capillary: 112 mg/dL — ABNORMAL HIGH (ref 70–99)
Glucose-Capillary: 124 mg/dL — ABNORMAL HIGH (ref 70–99)
Glucose-Capillary: 133 mg/dL — ABNORMAL HIGH (ref 70–99)
Glucose-Capillary: 140 mg/dL — ABNORMAL HIGH (ref 70–99)
Glucose-Capillary: 158 mg/dL — ABNORMAL HIGH (ref 70–99)
Glucose-Capillary: 168 mg/dL — ABNORMAL HIGH (ref 70–99)
Glucose-Capillary: 99 mg/dL (ref 70–99)

## 2018-11-19 LAB — CBC
HCT: 26.5 % — ABNORMAL LOW (ref 39.0–52.0)
Hemoglobin: 9 g/dL — ABNORMAL LOW (ref 13.0–17.0)
MCH: 34.5 pg — ABNORMAL HIGH (ref 26.0–34.0)
MCHC: 34 g/dL (ref 30.0–36.0)
MCV: 101.5 fL — ABNORMAL HIGH (ref 80.0–100.0)
Platelets: 185 10*3/uL (ref 150–400)
RBC: 2.61 MIL/uL — ABNORMAL LOW (ref 4.22–5.81)
RDW: 14.6 % (ref 11.5–15.5)
WBC: 6.6 10*3/uL (ref 4.0–10.5)
nRBC: 0.6 % — ABNORMAL HIGH (ref 0.0–0.2)

## 2018-11-19 LAB — HEPARIN LEVEL (UNFRACTIONATED): Heparin Unfractionated: 0.51 IU/mL (ref 0.30–0.70)

## 2018-11-19 MED ORDER — SODIUM CHLORIDE 0.9% FLUSH
3.0000 mL | Freq: Two times a day (BID) | INTRAVENOUS | Status: DC
  Administered 2018-11-19 – 2018-11-29 (×10): 3 mL via INTRAVENOUS

## 2018-11-19 MED ORDER — POTASSIUM CHLORIDE 10 MEQ/100ML IV SOLN
10.0000 meq | INTRAVENOUS | Status: AC
  Administered 2018-11-19 (×2): 10 meq via INTRAVENOUS
  Filled 2018-11-19 (×2): qty 100

## 2018-11-19 MED ORDER — POTASSIUM CHLORIDE 10 MEQ/100ML IV SOLN
10.0000 meq | INTRAVENOUS | Status: AC
  Administered 2018-11-19 (×2): 10 meq via INTRAVENOUS
  Filled 2018-11-19 (×4): qty 100

## 2018-11-19 MED ORDER — VITAL HIGH PROTEIN PO LIQD: 1000.0000 mL | ORAL | Status: DC

## 2018-11-19 NOTE — Progress Notes (Addendum)
PROGRESS NOTE  Chase Simmons UMP:[id number] DOB: March 26, 1933 DOA: 11/13/2018 PCP: [name], MD   LOS: 6 days   Brief Narrative / Interim history: 85 year old male with advanced dementia, chronic systolic CHF, paroxysmal A. fib not on anticoagulation due to being a poor candidate, COPD, type 2 diabetes mellitus, recent admission for COVID-19 infection (10/15-10/21), who came into the hospital and was admitted on 11/13/2018 with altered mental status, hypoxia, hypotension.  CT angiogram on admission showed bilateral acute PEs with significant clot burden.  Patient was admitted to the ICU, vascular surgery was consulted and he underwent thrombolysis with bilateral pulmonary artery TPA.  He improved post procedure, and he was transferred to the floor on 11/16/2018  Subjective / 24h Interval events: More alert this morning but very confused, nonverbal, mittens on and has nonpurposeful movement picking at things in the air  Assessment & Plan: Principal Problem Acute respiratory failure with hypoxemia secondary to of massive bilateral pulmonary embolism, shock -Status post ICU stay, vascular surgery consulted and underwent thrombolysis -Required Levophed temporarily, now off and stable on the floor -Currently patient is on IV heparin infusion.  He is unable to take p.o. and unable to transition to oral agents.  Vascular surgery recommended IVC filter if patient was unable to take p.o. eventually, and they will follow-up as an outpatient  Active Problems Dementia with severe delirium -Underlying dementia, had ICU delirium.  He was briefly on Precedex on 11/11 and became bradycardic.  Psychiatry was consulted. -Hold sedating medications as much as possible to allow patient to wake up  Hypernatremia -Secondary to dehydration, currently on dextrose infusion at low-dose to prevent fluid overload.  Sodium gradually improving and is normal today  Hypokalemia -Due to poor p.o. intake, replete  IV  Chronic systolic CHF (EF 40-08% on 01/16/2017) -Initially hypovolemia requiring IV fluids and pressors, remains euvolemic despite fluids, continue  Paroxysmal A. fib -Rate controlled, not a candidate for anticoagulation however will need blood thinners for the PE as detailed above  Type 2 diabetes mellitus -Continue gabapentin for neuropathy -Oral hypoglycemics on hold, continue sliding scale  CBG (last 3)  Recent Labs    11/19/18 0005 11/19/18 0425 11/19/18 0748  GLUCAP 112* 158* 105*   Hypertension -Stable blood pressure little bit on the high side but I suspect this is his baseline  Hyperlipidemia -Continue atorvastatin  COPD -No wheezing today  Nonobstructive CAD (by [facility] July 2018) -Asymptomatic  Essential tremor -Continue primidone.  Goals of care -Met today with the family around 1:30 PM, present at the meeting was Zadie Rhine and [name], patient's 3 children, and Elon Alas, NP with palliative.  We discussed patient's status prior to his Covid illness as well as his downtrend trajectory with hospitalizations for Covid and now for massive PE.  We discussed about patient's wishes, we detailed all medical aspects of his care currently and anticipation for recovery.  Family is entertaining the idea of using a feeding tube and seeing how he is recovering but they have not made up their mind yet.   Scheduled Meds: . atorvastatin  20 mg Oral q1800  . chlorhexidine  15 mL Mouth Rinse BID  . gabapentin  100 mg Oral QHS  . insulin aspart  0-9 Units Subcutaneous Q4H  . mouth rinse  15 mL Mouth Rinse q12n4p  . pantoprazole  40 mg Oral Daily  . PARoxetine  30 mg Oral Daily  . primidone  100 mg Oral TID  . QUEtiapine  100 mg Oral QHS  .  QUEtiapine  50 mg Oral BID  . traZODone  100 mg Oral QHS   Continuous Infusions: . dextrose 50 mL/hr at 11/18/18 1118  . heparin 1,250 Units/hr (11/18/18 2302)   PRN Meds:.haloperidol lactate, polyethylene glycol  DVT  prophylaxis: Heparin infusion Code Status: DNR Family Communication: Goals of care as above Disposition Plan: To be determined  Consultants:  Vascular surgery Critical care  Procedures:  TPA  Microbiology  SARS-CoV-2 11/13/2018-positive Urine culture 11/14/2018-multiple species Blood cultures 11/13/2018-no growth  Antimicrobials: None     Objective: Vitals:   11/18/18 2100 11/19/18 0325 11/19/18 0327 11/19/18 0746  BP: 140/75 (!) 131/116 (!) 146/94 (!) 133/96  Pulse:  (!) 111 (!) 122 95  Resp:  19  18  Temp:  98 F (36.7 C)  98 F (36.7 C)  TempSrc:  Oral    SpO2:  95%  100%  Weight:  73.8 kg    Height:        Intake/Output Summary (Last 24 hours) at 11/19/2018 1041 Last data filed at 11/19/2018 0350 Gross per 24 hour  Intake 874.49 ml  Output 250 ml  Net 624.49 ml   Filed Weights   11/16/18 2144 11/18/18 0409 11/19/18 0325  Weight: 74.6 kg 74.2 kg 73.8 kg    Examination:  Constitutional: No distress, more alert but still confused Eyes: No scleral icterus ENMT: Moist mucous membranes Neck: normal, supple Respiratory: No wheezing, no crackles Cardiovascular: Irregular, no murmurs, no edema Abdomen: Bowel sounds positive, nontender Musculoskeletal: no clubbing / cyanosis.  Skin: No rashes Neurologic: Does not follow commands with moves all 4 extremities independently   Data Reviewed: I have independently reviewed following labs and imaging studies   CBC: Recent Labs  Lab 11/13/18 1457 11/14/18 0000  11/15/18 0607 11/16/18 0345 11/17/18 0743 11/18/18 0531 11/19/18 0453  WBC 9.1 10.7*   < > 9.6 6.5 6.4 6.4 6.6  NEUTROABS 6.7 8.4*  --   --   --   --   --   --   HGB 11.7* 10.0*   < > 8.6* 8.8* 9.8* 9.2* 9.0*  HCT 36.7* 30.9*   < > 26.0* 27.4* 30.1* 28.9* 26.5*  MCV 108.9* 105.5*   < > 103.6* 106.2* 105.6* 106.3* 101.5*  PLT 213 188   < > 181 150 177 180 185   < > = values in this interval not displayed.   Basic Metabolic Panel: Recent Labs   Lab 11/14/18 0000  11/15/18 0607 11/16/18 0345 11/17/18 0547 11/18/18 0531 11/19/18 0453  NA 153*   < > 151* 152* 148* 149* 144  K 3.7   < > 3.5 3.7 3.4* 3.3* 3.1*  CL 120*   < > 124* 122* 119* 120* 115*  CO2 23   < > _0 20*  GLUCOSE 117*   < > 103* 122* 135* 120* 161*  BUN 58*   < > 36* 28* _1 CREATININE 1.75*   < > 0.85 0.65 0.89 0.79 0.75  CALCIUM 7.9*   < > 7.7* 8.0* 7.8* 7.9* 7.7*  MG 1.6*  --  1.5* 1.7  --   --   --   PHOS 5.3*  --   --  2.0*  --   --   --    < > = values in this interval not displayed.   Liver Function Tests: Recent Labs  Lab 11/13/18 1457 11/14/18 1207 11/19/18 0453  AST _2 ALT 16 12 11  ALKPHOS 85 73 71  BILITOT 0.8 0.7 1.0  PROT 7.1 5.9* 5.3*  ALBUMIN 3.1* 2.5* 2.2*   Coagulation Profile: Recent Labs  Lab 11/13/18 1457  INR 1.2   HbA1C: No results for input(s): HGBA1C in the last 72 hours. CBG: Recent Labs  Lab 11/18/18 1619 11/18/18 2008 11/19/18 0005 11/19/18 0425 11/19/18 0748  GLUCAP 128* 127* 112* 158* 105*    Recent Results (from the past 240 hour(s))  SARS CORONAVIRUS 2 (TAT 6-24 HRS) Nasopharyngeal Nasopharyngeal Swab     Status: Abnormal   Collection Time: 11/13/18  2:57 PM   Specimen: Nasopharyngeal Swab  Result Value Ref Range Status   SARS Coronavirus 2 POSITIVE (A) NEGATIVE Final    Comment: RESULT CALLED TO, READ BACK BY AND VERIFIED WITH: M. CAMPBELL,RN 0146 11/14/2018 T. TYSOR (NOTE) SARS-CoV-2 target nucleic acids are DETECTED. The SARS-CoV-2 RNA is generally detectable in upper and lower respiratory specimens during the acute phase of infection. Positive results are indicative of active infection with SARS-CoV-2. Clinical  correlation with patient history and other diagnostic information is necessary to determine patient infection status. Positive results do  not rule out bacterial infection or co-infection with other viruses. The expected result is Negative. Fact Sheet for  Patients: SugarRoll.be Fact Sheet for Healthcare Providers: https://www.woods-mathews.com/ This test is not yet approved or cleared by the [place] FDA and  has been authorized for detection and/or diagnosis of SARS-CoV-2 by FDA under an Emergency Use Authorization (EUA). This EUA will remain  in effect (meaning this test can be used) f or the duration of the COVID-19 declaration under Section 564(b)(1) of the Act, 21 U.S.C. section 360bbb-3(b)(1), unless the authorization is terminated or revoked sooner. Performed at [facility], [place] [address] [place], [place] [address]   Culture, blood (Routine x 2)     Status: None (Preliminary result)   Collection Time: 11/13/18  2:59 PM   Specimen: BLOOD  Result Value Ref Range Status   Specimen Description BLOOD BLOOD RIGHT ARM  Final   Special Requests   Final    BOTTLES DRAWN AEROBIC AND ANAEROBIC Blood Culture adequate volume   Culture   Final    NO GROWTH 4 DAYS Performed at [facility], [address], [place], [place] [address]    Report Status PENDING  Incomplete  Blood Culture (routine x 2)     Status: None (Preliminary result)   Collection Time: 11/13/18  3:00 PM   Specimen: BLOOD  Result Value Ref Range Status   Specimen Description BLOOD BLOOD LEFT ARM  Final   Special Requests   Final    BOTTLES DRAWN AEROBIC AND ANAEROBIC Blood Culture adequate volume   Culture   Final    NO GROWTH 4 DAYS Performed at [facility], [address], [place], [place] [address]    Report Status PENDING  Incomplete  Urine Culture     Status: Abnormal   Collection Time: 11/14/18  1:41 AM   Specimen: Urine, Random  Result Value Ref Range Status   Specimen Description   Final    URINE, RANDOM Performed at [facility], [address] [place], [place] [address]    Special Requests   Final    NONE Performed at [facility], [address],  [place], [place] [address]    Culture MULTIPLE SPECIES PRESENT, SUGGEST RECOLLECTION (A)  Final   Report Status 11/15/2018 FINAL  Final     Radiology Studies: No results  found.  Time spent: 40 minutes, more than 50% at bedside involved in goals of care/family discussions as detailed above  Marzetta Board, MD, PhD [facility]  Between 7 am - 7 pm I am available Contact me via Amion or Securechat  Between 7 pm - 7 am I am not available Contact night coverage MD/APP via [employer]

## 2018-11-19 NOTE — Progress Notes (Deleted)
SATURATION QUALIFICATIONS: (This note is used to comply with regulatory documentation for home oxygen)  Patient Saturations on Room Air at Rest = 96%  Patient Saturations on Room Air while Ambulating = 95%  

## 2018-11-19 NOTE — Plan of Care (Signed)
  Problem: Safety: Goal: Ability to remain free from injury will improve Outcome: Progressing   

## 2018-11-19 NOTE — Progress Notes (Signed)
Daily Progress Note   Patient Name: Chase Simmons       Date: 11/19/2018 DOB: 1933-09-07  Age: 85 y.o. MRN#: [id number] Attending Physician: Caren Griffins, MD Primary Care Physician: [name], MD Admit Date: 11/13/2018  Reason for Consultation/Follow-up: Establishing goals of care  Subjective: Met with all three of patient's children and Dr. Cruzita Lederer. Dr. Cruzita Lederer explained to family all of patient's multiple medical problems and how these comorbidities affect patient's trajectory of dementia and recovery.  I stayed with family after their meeting with Dr. Renne Crigler and continued to facility [place] discussion and assistance with decision making.  Family is hopeful for patient to regain ability to swallow to be able to sustain himself nutritionally and prevent aspiration. We reviewed (as did Dr. Cruzita Lederer) the overall progression of dementia and how in end stages people lose the ability to swallow functionally.  We discussed alternative of comfort feeding with aspiration precautions- and with accepting risk of aspiration and possible pneumonia. I reviewed patient's SLP note with family and assisted patient with ice chips while family observed. Also allowed family to observe pt taking sip of water- with noted cough. During our conversation- while at bedside patient spit out a ball of grey matter not consumed in my presence. I discussed with family this could have been residual from previous SLP eval or earlier attempts at feeding.  A great amount of time was spent discussing [place] with family.  Additionally, a great amount of time was spent discussing hospice services. Patient's daughter is reluctant for assistance from Hospice due to having her fiancee' receive Hospice services at the [place] and she  felt they were not supportive enough. I reviewed what services were provided locally at home and at residential Hospice.     Length of Stay: 6  Current Medications: Scheduled Meds:  . atorvastatin  20 mg Oral q1800  . chlorhexidine  15 mL Mouth Rinse BID  . feeding supplement (VITAL HIGH PROTEIN)  1,000 mL Per Tube Q24H  . gabapentin  100 mg Oral QHS  . insulin aspart  0-9 Units Subcutaneous Q4H  . mouth rinse  15 mL Mouth Rinse q12n4p  . pantoprazole  40 mg Oral Daily  . PARoxetine  30 mg Oral Daily  . primidone  100 mg Oral TID  . QUEtiapine  100 mg  Oral QHS  . QUEtiapine  50 mg Oral BID  . traZODone  100 mg Oral QHS    Continuous Infusions: . dextrose 50 mL/hr at 11/19/18 1118  . heparin 1,250 Units/hr (11/18/18 2302)    PRN Meds: haloperidol lactate, polyethylene glycol  Physical Exam Vitals signs and nursing note reviewed.  Constitutional:      Comments: frail  Cardiovascular:     Rate and Rhythm: Normal rate.  Pulmonary:     Effort: Pulmonary effort is normal.  Neurological:     Mental Status: He is alert. He is disoriented.     Comments: Chanting, unitelligible speech- does not answer questions             Vital Signs: BP (!) 133/96 (BP Location: Left Wrist)   Pulse 95   Temp 98 F (36.7 C)   Resp 18   Ht 5' 9" (1.753 m)   Wt 73.8 kg   SpO2 100%   BMI 24.03 kg/m  SpO2: SpO2: 100 % O2 Device: O2 Device: Room Air O2 Flow Rate: O2 Flow Rate (L/min): 2 L/min  Intake/output summary:   Intake/Output Summary (Last 24 hours) at 11/19/2018 1508 Last data filed at 11/19/2018 0350 Gross per 24 hour  Intake 551.67 ml  Output 250 ml  Net 301.67 ml   LBM: Last BM Date: 11/18/18 Baseline Weight: Weight: 86.2 kg Most recent weight: Weight: 73.8 kg       Palliative Assessment/Data: PPS: 10%    Flowsheet Rows     Most Recent Value  Intake Tab  Referral Department  Critical care  Unit at Time of Referral  ICU  Palliative Care Primary Diagnosis   Cardiac  Date Notified  11/14/18  Palliative Care Type  New Palliative care  Reason for referral  Clarify Goals of Care  Date of Admission  11/13/18  Date first seen by Palliative Care  11/14/18  # of days Palliative referral response time  0 Day(s)  # of days IP prior to Palliative referral  1  Clinical Assessment  Psychosocial & Spiritual Assessment  Palliative Care Outcomes      Patient Active Problem List   Diagnosis Date Noted  . Pressure injury of skin 11/14/2018  . Palliative care by specialist   . Hypotension   . Agitation   . Dementia with behavioral disturbance ([place])   . Pulmonary embolism ([place]) 11/13/2018  . Acute massive pulmonary embolism ([place]) 11/13/2018  . COVID-19 10/19/2018  . COVID-19 virus infection 10/18/2018  . Weakness 06/07/2018  . Skin tear of elbow without complication 16/10/9602  . Lactic acidosis 06/06/2018  . Depression 04/20/2018  . Altered mental status   . Frequency of urination 03/01/2018  . Confusion 03/01/2018  . Bilateral leg edema 11/08/2016  . Vertigo 08/25/2016  . Nausea 08/01/2016  . CHF (congestive heart failure) ([place]) 08/01/2016  . Atypical chest pain 07/20/2016  . COPD (chronic obstructive pulmonary disease) ([place]) 03/15/2016  . Coronary atherosclerosis of native coronary artery 01/05/2016  . Aortic atherosclerosis ([place]) 01/05/2016  . Bradycardia 01/05/2016  . Anxiety 06/17/2015  . Malignant neoplasm of urinary bladder ([place]) 06/17/2015  . Allergic rhinitis 05/14/2015  . Knee pain, bilateral 03/16/2015  . PAF (paroxysmal atrial fibrillation) ([place]) 08/30/2013  . Acid reflux 08/30/2013  . Goals of care, counseling/discussion 03/04/2013  . Tremor, essential 10/22/2012  . Hyperlipidemia 10/22/2012  . HTN (hypertension) 08/01/2012  . Type 2 diabetes mellitus with complication, without long-term current use of insulin ([place]) 08/01/2012  . Benign prostatic  hyperplasia with urinary obstruction 09/02/2011    Palliative Care  Assessment & Plan   Patient Profile: : 85 y.o. male  with past medical history of dementia, chronic systolic heart failure, paroxysmal atrial fibrillation (not on anticoagulation), HTN, COPD, type 2 DM, recent covid admission at [facility] 10/15-10/21 admitted on 11/13/2018 with AMS and hypoxia. CT angiogram of chest showed bilateral acute PE with significant clot burden. Vascular surgery consulted and patient underwent thrombolysis in bilateral pulmonary artery with TPA. Hypotensive on levophed. Palliative medicine consultation for goals of care.   Assessment/Recommendations/Plan   Trial NG feeding tube for a few days with GOC to continue with SLP and hope for return of swallow function- if no improvement or if patient worsens then transition to comfort care  Limit set of no PEG  Goals of Care and Additional Recommendations:  Limitations on Scope of Treatment: Full Scope Treatment  Code Status:  DNR  Prognosis:   Unable to determine  Discharge Planning:  To Be Determined  Care plan was discussed with patient's children- 2 daughters, 1 son.  Thank you for allowing the Palliative Medicine Team to assist in the care of this patient.   Time In: 1330 Time Out: 1520 Total Time 100 mins Prolonged Time Billed yes      Greater than 50%  of this time was spent counseling and coordinating care related to the above assessment and plan.  Mariana Kaufman, AGNP-C Palliative Medicine   Please contact Palliative Medicine Team phone at [phone] for questions and concerns.

## 2018-11-19 NOTE — Progress Notes (Signed)
ANTICOAGULATION CONSULT NOTE:  Pharmacy Consult for Heparin  Indication: pulmonary embolus  Allergies  Allergen Reactions  . Latex Itching   Patient Measurements: Height: 5\' 9"  (175.3 cm) Weight: 162 lb 11.2 oz (73.8 kg) IBW/kg (Calculated) : 70.7  Use TBW for heparin dosing.  Vital Signs: Temp: 98 F (36.7 C) (11/16 0325) Temp Source: Oral (11/16 0325) BP: 146/94 (11/16 0327) Pulse Rate: 122 (11/16 0327)  Labs: Recent Labs    11/17/18 0547  11/17/18 0743  11/17/18 1938 11/18/18 0531 11/19/18 0453  HGB  --    < > 9.8*  --   --  9.2* 9.0*  HCT  --   --  30.1*  --   --  28.9* 26.5*  PLT  --   --  177  --   --  180 185  HEPARINUNFRC  --   --   --    < > 0.50 0.50 0.51  CREATININE 0.89  --   --   --   --  0.79  --    < > = values in this interval not displayed.    Estimated Creatinine Clearance: 70 mL/min (by C-G formula based on SCr of 0.79 mg/dL).  Assessment: Pharmacy consulted to dose heparin in this 85 year old male admitted with PE.  No prior anticoagulation noted. H&H trending up, PLT WNL, baseline INR 1.2, aPTT 32s.  Heparin Course: 11/10 initiation: 6000 unit bolus, then 1500 units/hr 11/11 0402 HL 1.25: reduced to 1300 units/hr 11/11 1207 HL 1.05: hold for 1 hour and reduce rate to 1000 units/hr 11/11 2309 HL 0.74, reduce rate to 900 units/hr 11/12 0927 HL 0.55, therapeutic x 1 11/12 1837 HL 0.37, therapeutic x 2 11/13 0345 HL 0.31, inc to 1000 units/hr 11/13 1356 HL 0.30, inc to 1100 units/hr 11/14 0012 HL 0.26, inc to 1250 units/hr 11/14 1131 HL 0.49, therapeutic x1 11/14 1938 HL 0.50, therapeutic x 2 11/15 0531 HL 0.50, therapeutic x 3 11/16 0453 HL 0.51, therapeutic x 4, CBC stable  Goal of Therapy:  Heparin level 0.3-0.7 units/ml Monitor platelets by anticoagulation protocol: Yes   Plan:   Continue heparin drip at current rate (1250 units/hr)   Check CBC/HL with AM labs.  Continue to monitor for s/sx of bleeding - Hgb and plt count  stable  Pharmacy will continue to follow.   Ena Dawley, PharmD 11/19/2018 5:32 AM

## 2018-11-19 NOTE — Plan of Care (Signed)
Family meeting completed with pt, family, MD and palliative care team.  Pt to receive dubhoff ngt and tube feeding.    Problem: Education: Goal: Knowledge of General Education information will improve Description: Including pain rating scale, medication(s)/side effects and non-pharmacologic comfort measures Outcome: Progressing   Problem: Health Behavior/Discharge Planning: Goal: Ability to manage health-related needs will improve Outcome: Progressing   Problem: Clinical Measurements: Goal: Ability to maintain clinical measurements within normal limits will improve Outcome: Progressing Goal: Will remain free from infection Outcome: Progressing Goal: Diagnostic test results will improve Outcome: Progressing Goal: Respiratory complications will improve Outcome: Progressing Goal: Cardiovascular complication will be avoided Outcome: Progressing   Problem: Activity: Goal: Risk for activity intolerance will decrease Outcome: Progressing   Problem: Nutrition: Goal: Adequate nutrition will be maintained Outcome: Progressing   Problem: Coping: Goal: Level of anxiety will decrease Outcome: Progressing   Problem: Elimination: Goal: Will not experience complications related to bowel motility Outcome: Progressing Goal: Will not experience complications related to urinary retention Outcome: Progressing   Problem: Pain Managment: Goal: General experience of comfort will improve Outcome: Progressing   Problem: Safety: Goal: Ability to remain free from injury will improve Outcome: Progressing   Problem: Skin Integrity: Goal: Risk for impaired skin integrity will decrease Outcome: Progressing

## 2018-11-19 NOTE — Care Management Important Message (Signed)
Important Message  Patient Details  Name: ACEY MELCHER MRN: [id number] Date of Birth: [birth year]   Medicare Important Message Given:  Yes     Dannette Barbara 11/19/2018, 12:21 PM

## 2018-11-19 NOTE — Evaluation (Signed)
Clinical/Bedside Swallow Evaluation Patient Details  Name: Chase Simmons MRN: [id number] Date of Birth: 10-29-1933  Today's Date: 11/19/2018 Time: SLP Start Time (ACUTE ONLY): 0830 SLP Stop Time (ACUTE ONLY): 0925 SLP Time Calculation (min) (ACUTE ONLY): 55 min  Past Medical History:  Past Medical History:  Diagnosis Date  . Allergy   . Bladder cancer ([place])    a. followed by Dr. Jacqlyn Larsen  . Chronic combined systolic (congestive) and diastolic (congestive) heart failure ([place])    a. 07/2016 Echo: EF 30-35%, sev mid-apicalanteroseptal, ant, inf HK, Gr1 DD.  Marland Kitchen COPD (chronic obstructive pulmonary disease) ([place])   . Hyperlipidemia   . Hypertension   . NICM (nonischemic cardiomyopathy) ([place])    a. 07/2016 Echo: EF 30-35%, sev mid-apicalanteroseptal, ant, inf HK, Gr1 DD, mild MR, mildly dil LA, PASP 22mmHg - ? stress induced CM.  Marland Kitchen Nonobstructive Coronary atherosclerosis    a. 07/2016 Cath: LM nl, LAD nl, D1 40ost, LCX 49m, RCA nl, EF 25-30%.  Marland Kitchen PAF (paroxysmal atrial fibrillation) ([place])    a. Dx 08/2013. CHA2DS2VASc = 6-->no OAC 2/2 h/o falls and nocompliance.  . Tremor, essential    sees [facility] neurologist  . Type 2 diabetes mellitus with complication, without long-term current use of insulin ([place]) 08/01/2012  . Urinary incontinence    Past Surgical History:  Past Surgical History:  Procedure Laterality Date  . bladder cancer    . CATARACT EXTRACTION    . HERNIA REPAIR    . LEFT HEART CATH AND CORONARY ANGIOGRAPHY N/A 07/11/2016   Procedure: Left Heart Cath and Coronary Angiography;  Surgeon: Wellington Hampshire, MD;  Location: [facility];  Service: Cardiovascular;  Laterality: N/A;  . PULMONARY THROMBECTOMY Bilateral 11/13/2018   Procedure: PULMONARY THROMBECTOMY;  Surgeon: Algernon Huxley, MD;  Location: [facility];  Service: Cardiovascular;  Laterality: Bilateral;   HPI:  Pt is a 85 y.o. male with medical history significant of multiple medical issues including Advanced  Dementia, chronic systolic heart failure, paroxysmal atrial fibrillation (not a good candidate for anticoagulation due to frailty and advanced age), hypertension, COPD, type 2 diabetes, recent COVID infection (admitted to [facility] from 10/15-10/21) who presents from outside facility with concerns of altered mental status and hypoxia.  At admission, Patient was afebrile and hypotensive down to 80s over 50s with a MAP of 66.  CT angiogram of chest showed bilateral acute PE with significant clot burden. Vascular surgery consulted and patient underwent thrombolysis in bilateral pulmonary artery with TPA. Hypotensive on levophed.  Palliative medicine consultation for goals of care.  Pt has remained NPO in past 1-2 days d/t increased concern for dysphagia.  Noted increased Confusion as well requiring Mitts on hands.  CT Head: "Generalized atrophy and chronic microvascular ischemia without acute abnormality".    Assessment / Plan / Recommendation Clinical Impression  Pt presents w/ Severe oropharyngeal phase dysphagia - most notably poor Cgonitive awareness of po tasks; little to no oral phase awareness w/ presentation of po trial consistencies. This significantly increases pt's risk for aspiration as well as being unable to meet his nutrition/hydration needs adequately. During this session, pt was nonverbal, tremous in his motor movements, fidgity, and did not follow instructions given Max cues. Pt was positioned upright in bed for po trials; given oral care. Oral care revealed dried secretions which were removed. Bolus trials of ice chips, Nectar consistency liquids, and puree were given. Noted decreased awareness during the oral prep w/ reduced mouth opening/closure when  accepting trials. Once boluses were placed on tongue, oral holding noted. Increased time and Max verbal/tactile cues given to encourage oral manipulation and A-P transfer for swallowing. Delayed coughing, sneezing, and decreased  hyolaryngeal excursion noted during intermittent swallowing -- bolus trials of applesauce had to be suctioned from mouth d/t extensive oral holding. Swabs were then used to finish clearing mouth of any bolus residue. Oral motor exam revealed lingual bunching posteriorly; No unilateral weakness. Due to pt's presentation and declined Cognitive awareness for oral intake, nothing further was attempted. Recommend strict NPO status w/ frequent oral care for stim/hygiene; aspiration precautions. Recommend f/u by ST services for ongoing assessment of swallow and Cognitive awareness for safe po's/diet. Recommend f/u w/ Palliative Care for GOC including altenative means of feeding discussion. Dietician f/u for support.  SLP Visit Diagnosis: Dysphagia, oropharyngeal phase (R13.12)(impacted by advanced Dementia)    Aspiration Risk  Severe aspiration risk;Risk for inadequate nutrition/hydration    Diet Recommendation  NPO status w/ frequent oral care for hygiene and stimulation of swallow; aspiration precautions; staff assistance w/ oral care  Medication Administration: Via alternative means    Other  Recommendations Recommended Consults: (Palliative Care; dietician) Oral Care Recommendations: Oral care QID;Staff/trained caregiver to provide oral care(aspiration precautions) Other Recommendations: (TBD)   Follow up Recommendations (TBD)      Frequency and Duration min 3x week  2 weeks       Prognosis Prognosis for Safe Diet Advancement: Guarded Barriers to Reach Goals: Cognitive deficits;Time post onset;Severity of deficits      Swallow Study   General Date of Onset: 11/13/18 HPI: Pt is a 85 y.o. male with medical history significant of multiple medical issues including Advanced Dementia, chronic systolic heart failure, paroxysmal atrial fibrillation (not a good candidate for anticoagulation due to frailty and advanced age), hypertension, COPD, type 2 diabetes, recent COVID infection (admitted to  [facility] from 10/15-10/21) who presents from outside facility with concerns of altered mental status and hypoxia.  At admission, Patient was afebrile and hypotensive down to 80s over 50s with a MAP of 66.  CT angiogram of chest showed bilateral acute PE with significant clot burden. Vascular surgery consulted and patient underwent thrombolysis in bilateral pulmonary artery with TPA. Hypotensive on levophed.  Palliative medicine consultation for goals of care.  Pt has remained NPO in past 1-2 days d/t increased concern for dysphagia.  Noted increased Confusion as well requiring Mitts on hands.  CT Head: "Generalized atrophy and chronic microvascular ischemia without acute abnormality".  Type of Study: Bedside Swallow Evaluation Previous Swallow Assessment: 10/20/2018 at [facility] post transfer there d/t Covid positive Diet Prior to this Study: Regular;Thin liquids(post last BSE in 10/2018) Temperature Spikes Noted: No(wbc 6.6) Respiratory Status: Room air History of Recent Intubation: No Behavior/Cognition: Confused;Distractible;Requires cueing;Doesn't follow directions(Nonverbal) Oral Cavity Assessment: Dry;Dried secretions Oral Care Completed by SLP: Yes Oral Cavity - Dentition: Poor condition;Missing dentition Vision: (n/a) Self-Feeding Abilities: Total assist Patient Positioning: Upright in bed(needed full positioning) Baseline Vocal Quality: (nonverbal) Volitional Cough: Cognitively unable to elicit Volitional Swallow: Unable to elicit    Oral/Motor/Sensory Function Overall Oral Motor/Sensory Function: Generalized oral weakness(tremorous activity; posterior lingual bunching) Facial Symmetry: Within Functional Limits Lingual Symmetry: Within Functional Limits   Ice Chips Ice chips: Impaired Presentation: Spoon(fed; 2 trials) Oral Phase Impairments: Poor awareness of bolus Oral Phase Functional Implications: (boluses fell out of mouth) Pharyngeal Phase Impairments: (no  pharyngeal swallowing) Other Comments: max cues given   Thin Liquid Thin Liquid: Not tested  Nectar Thick Nectar Thick Liquid: Impaired Presentation: Spoon(5 trials) Oral Phase Impairments: Poor awareness of bolus;Reduced lingual movement/coordination;Reduced labial seal Oral phase functional implications: Prolonged oral transit;Oral residue;Oral holding;Right lateral sulci pocketing;Left lateral sulci pocketing(anterior leakage) Pharyngeal Phase Impairments: Suspected delayed Swallow;Cough - Delayed;Decreased hyoid-laryngeal movement(Sneezing; bolus material suctioned from mouth) Other Comments: max cues given   Honey Thick Honey Thick Liquid: Not tested   Puree Puree: Impaired Presentation: Spoon(fed; 4 trials) Oral Phase Impairments: Reduced labial seal;Reduced lingual movement/coordination;Poor awareness of bolus Oral Phase Functional Implications: Oral holding;Prolonged oral transit;Oral residue;Left lateral sulci pocketing;Right lateral sulci pocketing Pharyngeal Phase Impairments: Suspected delayed Swallow;Decreased hyoid-laryngeal movement;Cough - Delayed(x1; bolus material suctioned from mouth) Other Comments: max cues given   Solid     Solid: Not tested        Orinda Kenner, MS, CCC-SLP Watson,Katherine 11/19/2018,9:42 AM

## 2018-11-20 LAB — CBC
HCT: 27.3 % — ABNORMAL LOW (ref 39.0–52.0)
Hemoglobin: 8.8 g/dL — ABNORMAL LOW (ref 13.0–17.0)
MCH: 34.1 pg — ABNORMAL HIGH (ref 26.0–34.0)
MCHC: 32.2 g/dL (ref 30.0–36.0)
MCV: 105.8 fL — ABNORMAL HIGH (ref 80.0–100.0)
Platelets: 180 10*3/uL (ref 150–400)
RBC: 2.58 MIL/uL — ABNORMAL LOW (ref 4.22–5.81)
RDW: 15.1 % (ref 11.5–15.5)
WBC: 8.1 10*3/uL (ref 4.0–10.5)
nRBC: 1.4 % — ABNORMAL HIGH (ref 0.0–0.2)

## 2018-11-20 LAB — GLUCOSE, CAPILLARY
Glucose-Capillary: 149 mg/dL — ABNORMAL HIGH (ref 70–99)
Glucose-Capillary: 114 mg/dL — ABNORMAL HIGH (ref 70–99)
Glucose-Capillary: 158 mg/dL — ABNORMAL HIGH (ref 70–99)
Glucose-Capillary: 137 mg/dL — ABNORMAL HIGH (ref 70–99)
Glucose-Capillary: 168 mg/dL — ABNORMAL HIGH (ref 70–99)

## 2018-11-20 LAB — HEPARIN LEVEL (UNFRACTIONATED): Heparin Unfractionated: 0.46 IU/mL (ref 0.30–0.70)

## 2018-11-20 MED ORDER — NEPRO/CARBSTEADY PO LIQD
237.0000 mL | Freq: Two times a day (BID) | ORAL | Status: DC
  Administered 2018-11-25 – 2018-11-28 (×4): 237 mL via ORAL

## 2018-11-20 NOTE — TOC Progression Note (Signed)
Transition of Care [facility]) - Progression Note    Patient Details  Name: Chase Simmons MRN: [id number] Date of Birth: 1933/06/05  [facility] CM/SW Contact  Ross Ludwig, [place] Phone Number: 11/20/2018, 5:35 PM  Clinical Narrative:    CSW continuing to follow patient's progress throughout discharge planning.  Patient's family are trying to decide between having patient return to SNF with hospice to follow, or patient returning back home with hospice services.   Expected Discharge Plan: [place] Barriers to Discharge: Continued Medical Work up  Expected Discharge Plan and Services Expected Discharge Plan: [place] In-house Referral: Hospice / [place], Clinical Social Work     Living arrangements for the past 2 months: [place]                                       Social Determinants of Health (SDOH) Interventions    Readmission Risk Interventions Readmission Risk Prevention Plan 11/16/2018 06/07/2018  Transportation Screening Complete Complete  PCP or Specialist Appt within 3-5 Days - Complete  HRI or [place] - Complete  Social Work Consult for [place] Planning/Counseling - Not Complete  Medication Review [employer] [facility]) - Complete  PCP or Specialist appointment within 3-5 days of discharge Complete -  [place] or [place] Complete -  SW Recovery Care/Counseling Consult Complete -  Palliative Care Screening Complete -  [place] Not Applicable -  Some recent data might be hidden

## 2018-11-20 NOTE — Progress Notes (Addendum)
Daily Progress Note   Patient Name: Chase Simmons       Date: 11/20/2018 DOB: 23-Dec-1933  Age: 85 y.o. MRN#: [id number] Attending Physician: Caren Griffins, MD Primary Care Physician: [name], MD Admit Date: 11/13/2018  Reason for Consultation/Follow-up: Establishing goals of care  Subjective: Patient awake this morning in bed. Nonverbal. Noted unable to place NG tube and family decision not to do tube feeding.  I called and had a long discussion with Chase Simmons. She is having great deal of grief resurfacing from the loss of her fiancee a year ago. Caring for her father gave her purpose and distraction through her fiancee's death and the thought of losing her father now is unbearable.  Chase Simmons is agreeable to comfort feeding with strict aspiration precautions. We discussed we hope for the best- but need to prepare for possibility patient does not recover feeding ability to point to be able to sustain his life.  Discussed with Chase Simmons- SLP- she will reevaluate and make recommendations for patient.  Sherry's sister [name] will be at bedside this afternoon and I will followup with further discussion of [place]. Addendum: met with Chase Simmons. We discussed comfort feeding and what the [place] of comfort feeding with aspiration precautions entails. Chase Simmons noted when trying to feed her Dad he was not swallowing pureed food and it had to be manually removed from his mouth.  Chase Simmons mentioned Chase Simmons had discussed possible placement at [facility]- discussed with Chase Simmons- that if patient went to [facility]- they would quickly send him back to the hospital given his not eating and drinking.  I encouraged Chase Simmons to consider that in patient's current state- he is on path to EOL- and there needs to be consideration of how and where  he dies. Discussed options of discharging with Hospice at home vs residential Hospice.  Patient's sister- Chase Simmons- is very against Hospice- however, Chase Simmons and Chase Simmons would prefer Hospice and comfort care.  We discussed that in [place] when there is disagreement on plan of care among siblings- then it is a majority rule situation.  Chase Simmons is going to talk more with Chase Simmons and her brother re: discharge and future GOC.   Review of Systems  Unable to perform ROS: Dementia    Length of Stay: 7  Current Medications: Scheduled Meds:  . atorvastatin  20 mg Oral  q1800  . chlorhexidine  15 mL Mouth Rinse BID  . feeding supplement (VITAL HIGH PROTEIN)  1,000 mL Per Tube Q24H  . gabapentin  100 mg Oral QHS  . insulin aspart  0-9 Units Subcutaneous Q4H  . mouth rinse  15 mL Mouth Rinse q12n4p  . pantoprazole  40 mg Oral Daily  . PARoxetine  30 mg Oral Daily  . primidone  100 mg Oral TID  . QUEtiapine  100 mg Oral QHS  . QUEtiapine  50 mg Oral BID  . sodium chloride flush  3 mL Intravenous Q12H  . traZODone  100 mg Oral QHS    Continuous Infusions: . dextrose 50 mL/hr at 11/20/18 0520  . heparin 1,250 Units/hr (11/19/18 2336)    PRN Meds: haloperidol lactate, polyethylene glycol  Physical Exam Vitals signs and nursing note reviewed.  Constitutional:      Appearance: He is ill-appearing.     Comments: Frail, cachetic  Cardiovascular:     Rate and Rhythm: Normal rate.  Pulmonary:     Effort: Pulmonary effort is normal.  Skin:    General: Skin is warm and dry.     Coloration: Skin is pale.  Neurological:     Mental Status: He is alert. He is disoriented.     Comments: nonverbal             Vital Signs: BP 128/67 (BP Location: Left Arm)   Pulse (!) 51   Temp 98.4 F (36.9 C) (Oral)   Resp 20   Ht '5\' 9"'  (1.753 m)   Wt 73.5 kg   SpO2 96%   BMI 23.94 kg/m  SpO2: SpO2: 96 % O2 Device: O2 Device: Room Air O2 Flow Rate: O2 Flow Rate (L/min): 2 L/min  Intake/output summary:    Intake/Output Summary (Last 24 hours) at 11/20/2018 0955 Last data filed at 11/20/2018 5465 Gross per 24 hour  Intake 1433.9 ml  Output 2 ml  Net 1431.9 ml   LBM: Last BM Date: 11/18/18 Baseline Weight: Weight: 86.2 kg Most recent weight: Weight: 73.5 kg       Palliative Assessment/Data: PPS: 20%  Flowsheet Rows     Most Recent Value  Intake Tab  Referral Department  Critical care  Unit at Time of Referral  ICU  Palliative Care Primary Diagnosis  Cardiac  Date Notified  11/14/18  Palliative Care Type  New Palliative care  Reason for referral  Clarify Goals of Care  Date of Admission  11/13/18  Date first seen by Palliative Care  11/14/18  # of days Palliative referral response time  0 Day(s)  # of days IP prior to Palliative referral  1  Clinical Assessment  Psychosocial & Spiritual Assessment  Palliative Care Outcomes      Patient Active Problem List   Diagnosis Date Noted  . Oropharyngeal dysphagia   . Advanced care planning/counseling discussion   . Pressure injury of skin 11/14/2018  . Palliative care by specialist   . Hypotension   . Agitation   . Dementia with behavioral disturbance ([place])   . Pulmonary embolism ([place]) 11/13/2018  . Acute massive pulmonary embolism ([place]) 11/13/2018  . COVID-19 10/19/2018  . COVID-19 virus infection 10/18/2018  . Weakness 06/07/2018  . Skin tear of elbow without complication 68/12/7515  . Lactic acidosis 06/06/2018  . Depression 04/20/2018  . Altered mental status   . Frequency of urination 03/01/2018  . Confusion 03/01/2018  . Bilateral leg edema 11/08/2016  . Vertigo 08/25/2016  .  Nausea 08/01/2016  . CHF (congestive heart failure) ([place]) 08/01/2016  . Atypical chest pain 07/20/2016  . COPD (chronic obstructive pulmonary disease) ([place]) 03/15/2016  . Coronary atherosclerosis of native coronary artery 01/05/2016  . Aortic atherosclerosis ([place]) 01/05/2016  . Bradycardia 01/05/2016  . Anxiety 06/17/2015  . Malignant  neoplasm of urinary bladder ([place]) 06/17/2015  . Allergic rhinitis 05/14/2015  . Knee pain, bilateral 03/16/2015  . PAF (paroxysmal atrial fibrillation) ([place]) 08/30/2013  . Acid reflux 08/30/2013  . Goals of care, counseling/discussion 03/04/2013  . Tremor, essential 10/22/2012  . Hyperlipidemia 10/22/2012  . HTN (hypertension) 08/01/2012  . Type 2 diabetes mellitus with complication, without long-term current use of insulin ([place]) 08/01/2012  . Benign prostatic hyperplasia with urinary obstruction 09/02/2011    Palliative Care Assessment & Plan   Patient Profile: 85 y.o.malewith past medical history of dementia, chronic systolic heart failure, paroxysmal atrial fibrillation (not on anticoagulation), HTN, COPD, type 2 DM, recent covid admission at [facility] 10/15-10/21admitted on 11/10/2020with AMS and hypoxia.CT angiogram of chest showed bilateral acute PE with significant clot burden. Vascular surgery consulted and patient underwent thrombolysis in bilateral pulmonary artery with TPA. Hypotensive on levophed. Palliative medicine consultation for goals of care.  Assessment/Recommendations/Plan   SLP to see and reeval and make aspiration precaution and comfort feeding recommendations- appreciate their assistance in this difficult situation  PMT will followup with Chase Simmons- patient's other daughter this afternoon for further GOC  Addendum- Chase Simmons to speak with Chase Simmons and Chase Simmons re: hospice at home vs residential Hospice- will followup with PMT tomorrow  Goals of Care and Additional Recommendations:  Limitations on Scope of Treatment: Minimize Medications, Initiate Comfort Feeding and No Artificial Feeding  Code Status:  DNR  Prognosis:   Unable to determine- likely limited due to advanced dementia, plans for comfort feeding  Discharge Planning:  To Be Determined  Care plan was discussed with patient's daughter- Chase Simmons  Thank you for allowing the Palliative Medicine Team to  assist in the care of this patient.   Time In: 0930, 1325 Time Out: 1005, 1405 Total Time 75 minutes Prolonged Time Billed yes      Greater than 50%  of this time was spent counseling and coordinating care related to the above assessment and plan.  Mariana Kaufman, AGNP-C Palliative Medicine   Please contact Palliative Medicine Team phone at [phone] for questions and concerns.

## 2018-11-20 NOTE — Progress Notes (Signed)
PROGRESS NOTE  Chase Simmons [id number] DOB: 1933/02/10 DOA: 11/13/2018 PCP: [name], MD   LOS: 7 days   Brief Narrative / Interim history: 85 year old male with advanced dementia, chronic systolic CHF, paroxysmal A. fib not on anticoagulation due to being a poor candidate, COPD, type 2 diabetes mellitus, recent admission for COVID-19 infection (10/15-10/21), who came into the hospital and was admitted on 11/13/2018 with altered mental status, hypoxia, hypotension.  CT angiogram on admission showed bilateral acute PEs with significant clot burden.  Patient was admitted to the ICU, vascular surgery was consulted and he underwent thrombolysis with bilateral pulmonary artery TPA.  He improved post procedure, and he was transferred to the floor on 11/16/2018  Subjective / 24h Interval events: His eyes are open but minimally interactive, appears alert and picking up on things  Assessment & Plan: Principal Problem Acute respiratory failure with hypoxemia secondary to of massive bilateral pulmonary embolism, shock -Status post ICU stay, vascular surgery consulted and underwent thrombolysis -Required Levophed temporarily, now off and stable on the floor -Currently patient is on IV heparin infusion.  Just barely passed swallow eval today but he is at high risk for aspiration.  We will see how he takes p.o. intake and may be able to transition him to oral meds tomorrow  -Vascular surgery recommended IVC filter if patient was unable to take p.o. eventually, and they will follow-up as an outpatient  Active Problems Dementia with severe delirium -Underlying dementia, had ICU delirium.  He was briefly on Precedex on 11/11 and became bradycardic.  Psychiatry was consulted. -Hold sedating medications as much as possible to allow patient to wake up  Hypernatremia -Secondary to dehydration, currently on dextrose infusion at low-dose to prevent fluid overload.  Sodium is normalized, continue  to monitor  Hypokalemia -Due to poor p.o. intake, replete IV  Chronic systolic CHF (EF [id number] on 01/16/2017) -Initially hypovolemia requiring IV fluids and pressors, remains euvolemic despite fluids, continue  Paroxysmal A. fib -Rate controlled, not a candidate for anticoagulation however will need blood thinners for the PE as detailed above  Type 2 diabetes mellitus -Continue gabapentin for neuropathy -Oral hypoglycemics on hold, continue sliding scale  CBG (last 3)  Recent Labs    11/20/18 0408 11/20/18 0733 11/20/18 1151  GLUCAP 149* 114* 158*   Hypertension -Stable blood pressure little bit on the high side but I suspect this is his baseline  Hyperlipidemia -Continue atorvastatin  COPD -No wheezing today  Nonobstructive CAD (by [facility] July 2018) -Asymptomatic  Essential tremor -Continue primidone.  Goals of care -Discussed with palliative care, family initially wished for feeding tube, and nasogastric, however upon seeing how patient was struggling with placement decided to go against feeding tube and allow eating   Scheduled Meds: . atorvastatin  20 mg Oral q1800  . chlorhexidine  15 mL Mouth Rinse BID  . gabapentin  100 mg Oral QHS  . insulin aspart  0-9 Units Subcutaneous Q4H  . mouth rinse  15 mL Mouth Rinse q12n4p  . pantoprazole  40 mg Oral Daily  . PARoxetine  30 mg Oral Daily  . primidone  100 mg Oral TID  . QUEtiapine  100 mg Oral QHS  . QUEtiapine  50 mg Oral BID  . sodium chloride flush  3 mL Intravenous Q12H  . traZODone  100 mg Oral QHS   Continuous Infusions: . dextrose 50 mL/hr at 11/20/18 0520  . heparin 1,250 Units/hr (11/19/18 2336)   PRN Meds:.haloperidol lactate, polyethylene glycol  DVT prophylaxis: Heparin infusion Code Status: DNR Family Communication: Discussed with daughter Chase Simmons at bedside Disposition Plan: To be determined  Consultants:  Vascular surgery Critical care  Procedures:  TPA  Microbiology  SARS-CoV-2  11/13/2018-positive Urine culture 11/14/2018-multiple species Blood cultures 11/13/2018-no growth  Antimicrobials: None     Objective: Vitals:   11/19/18 2041 11/20/18 0501 11/20/18 0520 11/20/18 0736  BP: 134/73 123/73  128/67  Pulse: (!) 103 (!) 56  (!) 51  Resp: 19 19  20   Temp: 98.9 F (37.2 C) 98.8 F (37.1 C)  98.4 F (36.9 C)  TempSrc: Oral Oral  Oral  SpO2: 100% 100%  96%  Weight:   73.5 kg   Height:        Intake/Output Summary (Last 24 hours) at 11/20/2018 1243 Last data filed at 11/20/2018 0520 Gross per 24 hour  Intake 1433.9 ml  Output 2 ml  Net 1431.9 ml   Filed Weights   11/18/18 0409 11/19/18 0325 11/20/18 0520  Weight: 74.2 kg 73.8 kg 73.5 kg    Examination:  Constitutional: Confused but no apparent distress Eyes: No scleral icterus ENMT: Moist mucous membranes Neck: normal, supple Respiratory: No wheezing no crackles Cardiovascular: Irregular, no murmurs, no edema Abdomen: Positive bowel sounds, no tenderness Musculoskeletal: no clubbing / cyanosis.  Skin: No rashes seen Neurologic: Does not follow commands but moves all 4 extremities independently and appears to have equal strength   Data Reviewed: I have independently reviewed following labs and imaging studies   CBC: Recent Labs  Lab 11/13/18 1457 11/14/18 0000  11/16/18 0345 11/17/18 0743 11/18/18 0531 11/19/18 0453 11/20/18 0609  WBC 9.1 10.7*   < > 6.5 6.4 6.4 6.6 8.1  NEUTROABS 6.7 8.4*  --   --   --   --   --   --   HGB 11.7* 10.0*   < > 8.8* 9.8* 9.2* 9.0* 8.8*  HCT 36.7* 30.9*   < > 27.4* 30.1* 28.9* 26.5* 27.3*  MCV 108.9* 105.5*   < > 106.2* 105.6* 106.3* 101.5* 105.8*  PLT 213 188   < > 150 177 180 185 180   < > = values in this interval not displayed.   Basic Metabolic Panel: Recent Labs  Lab 11/14/18 0000  11/15/18 0607 11/16/18 0345 11/17/18 0547 11/18/18 0531 11/19/18 0453  NA 153*   < > 151* 152* 148* 149* 144  K 3.7   < > 3.5 3.7 3.4* 3.3* 3.1*  CL  120*   < > 124* 122* 119* 120* 115*  CO2 23   < > 22 22 22 22  20*  GLUCOSE 117*   < > 103* 122* 135* 120* 161*  BUN 58*   < > 36* 28* 20 17 13   CREATININE 1.75*   < > 0.85 0.65 0.89 0.79 0.75  CALCIUM 7.9*   < > 7.7* 8.0* 7.8* 7.9* 7.7*  MG 1.6*  --  1.5* 1.7  --   --   --   PHOS 5.3*  --   --  2.0*  --   --   --    < > = values in this interval not displayed.   Liver Function Tests: Recent Labs  Lab 11/13/18 1457 11/14/18 1207 11/19/18 0453  AST 22 21 17   ALT 16 12 11   ALKPHOS 85 73 71  BILITOT 0.8 0.7 1.0  PROT 7.1 5.9* 5.3*  ALBUMIN 3.1* 2.5* 2.2*   Coagulation Profile: Recent Labs  Lab 11/13/18 1457  INR  1.2   HbA1C: No results for input(s): HGBA1C in the last 72 hours. CBG: Recent Labs  Lab 11/19/18 2002 11/19/18 2332 11/20/18 0408 11/20/18 0733 11/20/18 1151  GLUCAP 168* 99 149* 114* 158*    Recent Results (from the past 240 hour(s))  SARS CORONAVIRUS 2 (TAT 6-24 HRS) Nasopharyngeal Nasopharyngeal Swab     Status: Abnormal   Collection Time: 11/13/18  2:57 PM   Specimen: Nasopharyngeal Swab  Result Value Ref Range Status   SARS Coronavirus 2 POSITIVE (A) NEGATIVE Final    Comment: RESULT CALLED TO, READ BACK BY AND VERIFIED WITH: M. CAMPBELL,RN 0146 11/14/2018 T. TYSOR (NOTE) SARS-CoV-2 target nucleic acids are DETECTED. The SARS-CoV-2 RNA is generally detectable in upper and lower respiratory specimens during the acute phase of infection. Positive results are indicative of active infection with SARS-CoV-2. Clinical  correlation with patient history and other diagnostic information is necessary to determine patient infection status. Positive results do  not rule out bacterial infection or co-infection with other viruses. The expected result is Negative. Fact Sheet for Patients: SugarRoll.be Fact Sheet for Healthcare Providers: https://www.woods-mathews.com/ This test is not yet approved or cleared by the  [place] FDA and  has been authorized for detection and/or diagnosis of SARS-CoV-2 by FDA under an Emergency Use Authorization (EUA). This EUA will remain  in effect (meaning this test can be used) f or the duration of the COVID-19 declaration under Section 564(b)(1) of the Act, 21 U.S.C. section 360bbb-3(b)(1), unless the authorization is terminated or revoked sooner. Performed at [facility], [place] [address] [place], [place] [address]   Culture, blood (Routine x 2)     Status: None (Preliminary result)   Collection Time: 11/13/18  2:59 PM   Specimen: BLOOD  Result Value Ref Range Status   Specimen Description BLOOD BLOOD RIGHT ARM  Final   Special Requests   Final    BOTTLES DRAWN AEROBIC AND ANAEROBIC Blood Culture adequate volume   Culture   Final    NO GROWTH 4 DAYS Performed at [facility], [address] [place], [place] [address]    Report Status PENDING  Incomplete  Blood Culture (routine x 2)     Status: None (Preliminary result)   Collection Time: 11/13/18  3:00 PM   Specimen: BLOOD  Result Value Ref Range Status   Specimen Description BLOOD BLOOD LEFT ARM  Final   Special Requests   Final    BOTTLES DRAWN AEROBIC AND ANAEROBIC Blood Culture adequate volume   Culture   Final    NO GROWTH 4 DAYS Performed at [facility], [address] [place], [place] [address]    Report Status PENDING  Incomplete  Urine Culture     Status: Abnormal   Collection Time: 11/14/18  1:41 AM   Specimen: Urine, Random  Result Value Ref Range Status   Specimen Description   Final    URINE, RANDOM Performed at [facility], [address] [place], [place] [address]    Special Requests   Final    NONE Performed at [facility], [address] [place],  [address]    Culture MULTIPLE SPECIES PRESENT, SUGGEST RECOLLECTION (A)  Final   Report Status 11/15/2018 FINAL  Final     Radiology Studies: No results found.    Marzetta Board, MD, PhD [facility]  Between 7 am - 7 pm I am available Contact me via Amion or Securechat  Between 7 pm - 7  am I am not available Contact night coverage MD/APP via Amion

## 2018-11-20 NOTE — Progress Notes (Signed)
Phone called received from daughter [name]. Reports family was aware of unsuccessful feeding tube placement earlier. Inquired whether feeding tube had been placed per radiology. When notified that tube had not been inserted, she reports family has reached decision and requests that tube not be placed. Will report to oncoming RN and rounding MD in AM since not feeding tube scheduled for placement tonight.  Family request to not insert feeding tube verified over phone with 2nd RN Levester Fresh.

## 2018-11-20 NOTE — Progress Notes (Signed)
ANTICOAGULATION CONSULT NOTE:  Pharmacy Consult for Heparin  Indication: pulmonary embolus  Allergies  Allergen Reactions  . Latex Itching   Patient Measurements: Height: 5\' 9"  (175.3 cm) Weight: 162 lb 1.6 oz (73.5 kg) IBW/kg (Calculated) : 70.7  Use TBW for heparin dosing.  Vital Signs: Temp: 98.8 F (37.1 C) (11/17 0501) Temp Source: Oral (11/17 0501) BP: 123/73 (11/17 0501) Pulse Rate: 56 (11/17 0501)  Labs: Recent Labs    11/18/18 0531 11/19/18 0453 11/20/18 0609  HGB 9.2* 9.0* 8.8*  HCT 28.9* 26.5* 27.3*  PLT 180 185 180  HEPARINUNFRC 0.50 0.51 0.46  CREATININE 0.79 0.75  --     Estimated Creatinine Clearance: 70 mL/min (by C-G formula based on SCr of 0.75 mg/dL).  Assessment: Pharmacy consulted to dose heparin in this 85 year old male admitted with PE.  No prior anticoagulation noted. H&H trending up, PLT WNL, baseline INR 1.2, aPTT 32s.  Heparin Course: 11/10 initiation: 6000 unit bolus, then 1500 units/hr 11/11 0402 HL 1.25: reduced to 1300 units/hr 11/11 1207 HL 1.05: hold for 1 hour and reduce rate to 1000 units/hr 11/11 2309 HL 0.74, reduce rate to 900 units/hr 11/12 0927 HL 0.55, therapeutic x 1 11/12 1837 HL 0.37, therapeutic x 2 11/13 0345 HL 0.31, inc to 1000 units/hr 11/13 1356 HL 0.30, inc to 1100 units/hr 11/14 0012 HL 0.26, inc to 1250 units/hr 11/14 1131 HL 0.49, therapeutic x1 11/14 1938 HL 0.50, therapeutic x 2 11/15 0531 HL 0.50, therapeutic x 3 11/16 0453 HL 0.51, therapeutic x 4, CBC stable  Goal of Therapy:  Heparin level 0.3-0.7 units/ml Monitor platelets by anticoagulation protocol: Yes   Plan:  11/17 @ 0600 HL 0.46 therapeutic. Will continue current rate and will recheck HL w/ am labs. CBC trending down will continue to monitor.  Pharmacy will continue to follow.   Tobie Lords, PharmD 11/20/2018 7:02 AM

## 2018-11-20 NOTE — Progress Notes (Signed)
Speech Language Pathology Treatment: Dysphagia  Patient Details Name: Chase Simmons MRN: [id number] DOB: November 04, 1933 Today's Date: 11/20/2018 Time: [id number] SLP Time Calculation (min) (ACUTE ONLY): 65 min  Assessment / Plan / Recommendation Clinical Impression  Pt seen for ongoing assessment of oropharyngeal swallowing function and ability to tolerate po's, initiation of an oral diet today. Daughter present during the session and assisted in giving verbal cues and encouragement to pt for swallowing. Pt continues to present w/ Mod-Severe declined Cognitive presentation; reduced engagement, eyes closed during interactions/session, no consistent follow through w/ instructions, mumbled speech for 2-3 verbalizations. However, he does appear min improved and min more engaged w/ SLP today versus at evaluation yesterday.  Pt continues to present w/ Mod-Severe oropharyngeal phase dysphagia - most notably decreased Cognitive awareness for po tasks; decreased oral phase awareness during bolus management of po trial consistencies. This significantly increases pt's risk for Aspiration as well as being unable to meet his nutrition/hydration needs adequately. During this session, pt was mostly nonverbal, eyes closed, tremors in his motor movements - moreso in the mouth/jaw as he focused on boluses being held orally. He did not immediate follow instructions though given Max cues but was able to respond to tactile cues/presentation of boluses at lips. Constant verbal/tactile cues and Time given w/ each bolus trial today at session and close monitoring to ensure swallowing/clearing w/ each bolus trial given. Pt was positioned upright in bed for po trials; given thorough oral care before and after po trials. Oral care revealed dried secretions which were removed. Swabs and bolus trials of ice chips given initially to wet mouth, then Nectar consistency liquids VIA TSP, and puree were given. After bolus acceptance (w/  cues), noted decreased awareness during the oral prep stage, reduced lingual/oral movements for bolus manipulation, then oral holding for ~30+ seconds often w/ both Nectar and Puree consistencies. Increased Time, Alternating food/liquid, and Max verbal/tactile cues given to encourage oral manipulation and A-P transfer for swallowing. Pt achieved and pharyngeal swallow post delay; suspect delayed pharyngeal swallow initiation; decreased hyolaryngeal excursion noted. Overt, delayed coughing and sneezing noted x1 each during po trials. No overt decline in respiratory status during/post trials - pt rested w/ Dtr present post po's. After ~1 hour of po trials(~2 ozs each of nectar liquids, puree = ~4ozs total), and education, po's were stopped to give pt a rest - he appeared min more disorganized during the oral phase at that time. Swabs were used to finish clearing mouth of any bolus residue.  Due to pt's presentation and declined Cognitive awareness for oral intake, MD and Dtr consulted re: initiation of oral diet. Both agreed w/ GOC to "try this diet" w/ aspiration precautions and feeding support. Recommend a Dysphagia level 1 (PUREE) w/ NECTAR liquids w/ strict aspiration precautions; frequent oral care for stim/hygiene; pills Crushed in puree if necessary to give. Recommend f/u by ST services for ongoing assessment of swallow and Cognitive awareness for safe po's/diet; education w/ family as needed. Recommend f/u w/ Palliative Care for Chase Simmons. Dietician f/u for support.     HPI HPI: Pt is a 85 y.o. male with medical history significant of multiple medical issues including Advanced Dementia, chronic systolic heart failure, paroxysmal atrial fibrillation (not a good candidate for anticoagulation due to frailty and advanced age), hypertension, COPD, type 2 diabetes, recent COVID infection (admitted to Chase Simmons from 10/15-10/21) who presents from outside facility with concerns of altered mental status and  hypoxia.  At admission, Patient was afebrile  and hypotensive down to 80s over 50s with a MAP of 66.  CT angiogram of chest showed bilateral acute PE with significant clot burden. Vascular surgery consulted and patient underwent thrombolysis in bilateral pulmonary artery with TPA. Hypotensive on levophed.  Palliative medicine consultation for goals of care.  Pt has remained NPO in past 1-2 days d/t increased concern for dysphagia.  Noted increased Confusion as well requiring Mitts on hands.  CT Head: "Generalized atrophy and chronic microvascular ischemia without acute abnormality".       SLP Plan  Continue with current plan of care       Recommendations  Diet recommendations: Dysphagia 1 (puree);Nectar-thick liquid(initiate) Liquids provided via: Teaspoon(ONLY) Medication Administration: Crushed with puree(as able to engage and tolerate) Supervision: Staff to assist with self feeding;Full supervision/cueing for compensatory strategies Compensations: Minimize environmental distractions;Slow rate;Small sips/bites;Lingual sweep for clearance of pocketing;Multiple dry swallows after each bite/sip;Follow solids with liquid(MAX verbal/tactile cues during) Postural Changes and/or Swallow Maneuvers: Seated upright 90 degrees;Upright 30-60 min after meal                General recommendations: (Dietician f/u) Oral Care Recommendations: Oral care BID;Oral care before and after PO;Staff/trained caregiver to provide oral care Follow up Recommendations: Home health SLP(TBD) SLP Visit Diagnosis: Dysphagia, oropharyngeal phase (R13.12)(declined Cognitive status baseline) Plan: Continue with current plan of care       [place], [place], CCC-SLP Zebulan Hinshaw 11/20/2018, 1:28 PM

## 2018-11-20 NOTE — Progress Notes (Signed)
Per Velna Hatchet, RN, she attempted to insert the dubhoff NGT x2 without success.  Pt coughing and gagging on NGT.  MD notified that NGT will need to be inserted by IR.

## 2018-11-20 NOTE — Progress Notes (Signed)
Nutrition Follow Up Note   DOCUMENTATION CODES:   Not applicable  INTERVENTION:   RD will monitor for [place]  RD will order supplements pending SLP evaluation  NUTRITION DIAGNOSIS:   Increased nutrient needs related to acute illness, chronic illness, wound healing(acute bilateral PE; recent COVID infection on chronic CHF; COPD; stage 1; mid; sacrum) as evidenced by estimated needs, percent weight loss.  GOAL:   Patient will meet greater than or equal to 90% of their needs-not met   MONITOR:   Labs, I & O's, Weight trends, Diet advancement  ASSESSMENT:   85 y.o. male  with past medical history of dementia, chronic systolic heart failure, paroxysmal atrial fibrillation (not on anticoagulation), HTN, COPD, type 2 DM, recent covid admission at [facility] 10/15-10/21 admitted on 11/13/2018 with AMS and hypoxia. CT angiogram of chest showed bilateral acute PE with significant clot burden. Vascular surgery consulted and patient underwent thrombolysis in bilateral pulmonary artery with TPA.   RN unable to place NGT. Pt seen by palliative care; family does not wish to pursue tube feedings. SLP evaluation pending to assess for comfort feeds. RD will add supplements once diet advanced. Per chart, pt down ~6lbs since admit.      Medications reviewed and include: insulin, protonix, paxil, 10% dextrose _0 /hr, heparin  Labs reviewed: K 3.1(L) Hgb 8.8(L), Hct 27.3(L), MCV 105.8(H), MCH 34.1(H)  Diet Order:   Diet Order    None     EDUCATION NEEDS:   No education needs have been identified at this time  Skin:  Skin Assessment: Reviewed RN Assessment(stage1;mid;sacrum)  Last BM:  11/17- type 6  Height:   Ht Readings from Last 1 Encounters:  11/13/18 _1  (1.753 m)    Weight:   Wt Readings from Last 1 Encounters:  11/20/18 73.5 kg    Ideal Body Weight:  72.7 kg  BMI:  Body mass index is 23.94 kg/m.  Estimated Nutritional Needs:   Kcal:  1900-2100  Protein:   100-115  Fluid:  >/= 1.9 L/day  Koleen Distance MS, RD, LDN Pager #- [phone] Office#- [phone] After Hours Pager: [phone]

## 2018-11-21 LAB — CBC
HCT: 28.9 % — ABNORMAL LOW (ref 39.0–52.0)
Hemoglobin: 9.3 g/dL — ABNORMAL LOW (ref 13.0–17.0)
MCH: 33.6 pg (ref 26.0–34.0)
MCHC: 32.2 g/dL (ref 30.0–36.0)
MCV: 104.3 fL — ABNORMAL HIGH (ref 80.0–100.0)
Platelets: 209 10*3/uL (ref 150–400)
RBC: 2.77 MIL/uL — ABNORMAL LOW (ref 4.22–5.81)
RDW: 15.5 % (ref 11.5–15.5)
WBC: 8.6 10*3/uL (ref 4.0–10.5)
nRBC: 0.6 % — ABNORMAL HIGH (ref 0.0–0.2)

## 2018-11-21 LAB — COMPREHENSIVE METABOLIC PANEL
ALT: 11 U/L (ref 0–44)
AST: 18 U/L (ref 15–41)
Albumin: 2.3 g/dL — ABNORMAL LOW (ref 3.5–5.0)
Alkaline Phosphatase: 82 U/L (ref 38–126)
Anion gap: 6 (ref 5–15)
BUN: 10 mg/dL (ref 8–23)
CO2: 21 mmol/L — ABNORMAL LOW (ref 22–32)
Calcium: 7.8 mg/dL — ABNORMAL LOW (ref 8.9–10.3)
Chloride: 113 mmol/L — ABNORMAL HIGH (ref 98–111)
Creatinine, Ser: 0.75 mg/dL (ref 0.61–1.24)
GFR calc Af Amer: >60 mL/min (ref >60)
GFR calc non Af Amer: >60 mL/min (ref >60)
Glucose, Bld: 139 mg/dL — ABNORMAL HIGH (ref 70–99)
Potassium: 3 mmol/L — ABNORMAL LOW (ref 3.5–5.1)
Sodium: 140 mmol/L (ref 135–145)
Total Bilirubin: 1 mg/dL (ref 0.3–1.2)
Total Protein: 5.7 g/dL — ABNORMAL LOW (ref 6.5–8.1)

## 2018-11-21 LAB — HEPARIN LEVEL (UNFRACTIONATED)
Heparin Unfractionated: <0.1 IU/mL — ABNORMAL LOW (ref 0.30–0.70)
Heparin Unfractionated: 0.26 IU/mL — ABNORMAL LOW (ref 0.30–0.70)

## 2018-11-21 LAB — GLUCOSE, CAPILLARY
Glucose-Capillary: 109 mg/dL — ABNORMAL HIGH (ref 70–99)
Glucose-Capillary: 133 mg/dL — ABNORMAL HIGH (ref 70–99)
Glucose-Capillary: 135 mg/dL — ABNORMAL HIGH (ref 70–99)
Glucose-Capillary: 149 mg/dL — ABNORMAL HIGH (ref 70–99)
Glucose-Capillary: 152 mg/dL — ABNORMAL HIGH (ref 70–99)
Glucose-Capillary: 201 mg/dL — ABNORMAL HIGH (ref 70–99)

## 2018-11-21 MED ORDER — HEPARIN BOLUS VIA INFUSION
1000.0000 [IU] | Freq: Once | INTRAVENOUS | Status: AC
  Administered 2018-11-21: 1000 [IU] via INTRAVENOUS
  Filled 2018-11-21: qty 1000

## 2018-11-21 MED ORDER — SALINE SPRAY 0.65 % NA SOLN
1.0000 | NASAL | Status: DC | PRN
  Filled 2018-11-21: qty 44

## 2018-11-21 MED ORDER — POTASSIUM CHLORIDE 10 MEQ/100ML IV SOLN
10.0000 meq | INTRAVENOUS | Status: AC
  Administered 2018-11-21 (×4): 10 meq via INTRAVENOUS
  Filled 2018-11-21 (×4): qty 100

## 2018-11-21 MED ORDER — AMLODIPINE BESYLATE 5 MG PO TABS
2.5000 mg | ORAL_TABLET | Freq: Every day | ORAL | Status: DC
  Administered 2018-11-21 – 2018-11-30 (×10): 2.5 mg via ORAL
  Filled 2018-11-21 (×10): qty 1

## 2018-11-21 NOTE — Progress Notes (Addendum)
ANTICOAGULATION CONSULT NOTE:  Pharmacy Consult for Heparin  Indication: pulmonary embolus  Allergies  Allergen Reactions  . Latex Itching   Patient Measurements: Height: 5\' 9"  (175.3 cm) Weight: 166 lb 14.2 oz (75.7 kg) IBW/kg (Calculated) : 70.7  Use TBW for heparin dosing.  Vital Signs: Temp: 99.2 F (37.3 C) (11/17 2002) Temp Source: Oral (11/17 2002) BP: 124/100 (11/18 0553) Pulse Rate: 99 (11/18 0553)  Labs: Recent Labs    11/19/18 0453 11/20/18 0609 11/21/18 0354  HGB 9.0* 8.8* 9.3*  HCT 26.5* 27.3* 28.9*  PLT 185 180 209  HEPARINUNFRC 0.51 0.46 <0.10*  CREATININE 0.75  --  0.75    Estimated Creatinine Clearance: 70 mL/min (by C-G formula based on SCr of 0.75 mg/dL).  Assessment: Pharmacy consulted to dose heparin in this 85 year old male admitted with PE.  No prior anticoagulation noted. H&H trending up, PLT WNL, baseline INR 1.2, aPTT 32s.  Heparin Course: 11/10 initiation: 6000 unit bolus, then 1500 units/hr 11/11 0402 HL 1.25: reduced to 1300 units/hr 11/11 1207 HL 1.05: hold for 1 hour and reduce rate to 1000 units/hr 11/11 2309 HL 0.74, reduce rate to 900 units/hr 11/12 0927 HL 0.55, therapeutic x 1 11/12 1837 HL 0.37, therapeutic x 2 11/13 0345 HL 0.31, inc to 1000 units/hr 11/13 1356 HL 0.30, inc to 1100 units/hr 11/14 0012 HL 0.26, inc to 1250 units/hr 11/14 1131 HL 0.49, therapeutic x1 11/14 1938 HL 0.50, therapeutic x 2 11/15 0531 HL 0.50, therapeutic x 3 11/16 0453 HL 0.51, therapeutic x 4, CBC stable  Goal of Therapy:  Heparin level 0.3-0.7 units/ml Monitor platelets by anticoagulation protocol: Yes   Plan:  11/18 @ 0400 HL < 0.10 subtherapeutic. Heparin drip was stopped for roughly 30 minutes per RN d/t patient pulling out his line, will increase rate to 1350 units/hr since patient has a submassive PE in order to get him therapeutic a bit quicker and will recheck HL @ 1400, CBC appears stable will continue to monitor.  11/18  **update** heparin was stopped again for placement of new line and restarted at 0725 will reschedule HL for 1500 and continue to monitor.  Pharmacy will continue to follow.   Tobie Lords, PharmD 11/21/2018 6:12 AM

## 2018-11-21 NOTE — Plan of Care (Signed)
  Problem: Activity: Goal: Risk for activity intolerance will decrease Outcome: Not Progressing   Problem: Nutrition: Goal: Adequate nutrition will be maintained Outcome: Not Progressing   

## 2018-11-21 NOTE — Progress Notes (Signed)
SLP Cancellation Note  Patient Details Name: Chase Simmons MRN: [id number] DOB: April 17, 1933   Cancelled treatment:       Reason Eval/Treat Not Completed: Patient not medically ready(for any diet upgrade; NSG consulted). Pt continues to be drowsy and lethargic. He has not been fully responsive for NSG to feed po's (at a meal); NSG was able to get pt to swallow 1-2x w/ a po med earlier today w/ Max cues.  ST services recommends continuing w/ po trials of current dysphagia diet, Dysphagia 1 w/ Nectar liquids by TSP w/ strict aspiration precautions and feeding Supervision. Frequent oral care b/f and after any po's. ST will f/u w/ pt's status over next 1-2 days; recommend continued discussion w/ Palliative Care for [place].      Orinda Kenner, MS, CCC-SLP Chase Simmons 11/21/2018, 2:45 PM

## 2018-11-21 NOTE — Progress Notes (Signed)
Daily Progress Note   Patient Name: Chase Simmons       Date: 11/21/2018 DOB: [birth year]  Age: 85 y.o. MRN#: [id number] Attending Physician: Caren Griffins, MD Primary Care Physician: [name], MD Admit Date: 11/13/2018  Reason for Consultation/Follow-up: Establishing goals of care  Subjective: Patient appears calmer today, less agitated- daughter Chase Simmons at bedside states she believes this is due to restarting seroquel. Lunch tray at bedside untouched. Chase Simmons notes that patient has been sleeping too much to eat.  Chase Simmons does not have any questions, declines to talk about [place]- states- we are just waiting to see if he will eat.  I called daughter Chase Simmons for followup- [name] notes she's feeling very stressed and needed to take time away before she could come to any final decisions. She is spending today away from patient's bedside. [name] states their continues to be conflict in [place] between her brother and sister. They are requesting more time before moving forward with comfort and hospice.   ROS  Length of Stay: 8  Current Medications: Scheduled Meds:  . amLODipine  2.5 mg Oral Daily  . atorvastatin  20 mg Oral q1800  . chlorhexidine  15 mL Mouth Rinse BID  . feeding supplement (NEPRO CARB STEADY)  237 mL Oral BID BM  . gabapentin  100 mg Oral QHS  . insulin aspart  0-9 Units Subcutaneous Q4H  . mouth rinse  15 mL Mouth Rinse q12n4p  . pantoprazole  40 mg Oral Daily  . PARoxetine  30 mg Oral Daily  . primidone  100 mg Oral TID  . QUEtiapine  100 mg Oral QHS  . QUEtiapine  50 mg Oral BID  . sodium chloride flush  3 mL Intravenous Q12H  . traZODone  100 mg Oral QHS    Continuous Infusions: . dextrose 50 mL/hr at 11/21/18 1232  . heparin 1,350 Units/hr (11/21/18 1235)     PRN Meds: haloperidol lactate, polyethylene glycol, sodium chloride  Physical Exam          Vital Signs: BP (!) 138/96 (BP Location: Left Arm)   Pulse 81   Temp 98.4 F (36.9 C) (Oral)   Resp 18   Ht 5\' 9"  (1.753 m)   Wt 75.7 kg   SpO2 95%   BMI 24.65 kg/m  SpO2: SpO2: 95 %  O2 Device: O2 Device: Room Air O2 Flow Rate: O2 Flow Rate (L/min): 2 L/min  Intake/output summary:   Intake/Output Summary (Last 24 hours) at 11/21/2018 1453 Last data filed at 11/21/2018 1229 Gross per 24 hour  Intake 193.51 ml  Output 75 ml  Net 118.51 ml   LBM: Last BM Date: 11/21/18 Baseline Weight: Weight: 86.2 kg Most recent weight: Weight: 75.7 kg       Palliative Assessment/Data: PPS: 10%    Flowsheet Rows     Most Recent Value  Intake Tab  Referral Department  Critical care  Unit at Time of Referral  ICU  Palliative Care Primary Diagnosis  Cardiac  Date Notified  11/14/18  Palliative Care Type  New Palliative care  Reason for referral  Clarify Goals of Care  Date of Admission  11/13/18  Date first seen by Palliative Care  11/14/18  # of days Palliative referral response time  0 Day(s)  # of days IP prior to Palliative referral  1  Clinical Assessment  Psychosocial & Spiritual Assessment  Palliative Care Outcomes      Patient Active Problem List   Diagnosis Date Noted  . Oropharyngeal dysphagia   . Advanced care planning/counseling discussion   . Pressure injury of skin 11/14/2018  . Palliative care by specialist   . Hypotension   . Agitation   . Dementia with behavioral disturbance ([place])   . Pulmonary embolism ([place]) 11/13/2018  . Acute massive pulmonary embolism ([place]) 11/13/2018  . COVID-19 10/19/2018  . COVID-19 virus infection 10/18/2018  . Weakness 06/07/2018  . Skin tear of elbow without complication [id number]  . Lactic acidosis 06/06/2018  . Depression 04/20/2018  . Altered mental status   . Frequency of urination 03/01/2018  . Confusion 03/01/2018  .  Bilateral leg edema 11/08/2016  . Vertigo 08/25/2016  . Nausea 08/01/2016  . CHF (congestive heart failure) ([place]) 08/01/2016  . Atypical chest pain 07/20/2016  . COPD (chronic obstructive pulmonary disease) ([place]) 03/15/2016  . Coronary atherosclerosis of native coronary artery 01/05/2016  . Aortic atherosclerosis ([place]) 01/05/2016  . Bradycardia 01/05/2016  . Anxiety 06/17/2015  . Malignant neoplasm of urinary bladder ([place]) 06/17/2015  . Allergic rhinitis 05/14/2015  . Knee pain, bilateral 03/16/2015  . PAF (paroxysmal atrial fibrillation) ([place]) 08/30/2013  . Acid reflux 08/30/2013  . Goals of care, counseling/discussion 03/04/2013  . Tremor, essential 10/22/2012  . Hyperlipidemia 10/22/2012  . HTN (hypertension) 08/01/2012  . Type 2 diabetes mellitus with complication, without long-term current use of insulin ([place]) 08/01/2012  . Benign prostatic hyperplasia with urinary obstruction 09/02/2011    Palliative Care Assessment & Plan   Patient Profile: 85 y.o. male  with past medical history of dementia, chronic systolic heart failure, paroxysmal atrial fibrillation (not on anticoagulation), HTN, COPD, type 2 DM, recent covid admission at [facility] 10/15-10/21 admitted on 11/13/2018 with AMS and hypoxia. CT angiogram of chest showed bilateral acute PE with significant clot burden. Vascular surgery consulted and patient underwent thrombolysis in bilateral pulmonary artery with TPA. Hypotensive on levophed. Palliative medicine consultation for goals of care.     Assessment/Recommendations/Plan   Continue current care  PMT will follow with patient's children tomorrow- they are requesting a few more days for decision making  Goals of Care and Additional Recommendations:  Limitations on Scope of Treatment: No Artificial Feeding  Code Status:  DNR  Prognosis:   Unable to determine  Discharge Planning:  To Be Determined  Care plan was discussed with patient's daughters- Chase Simmons  and  Chase Simmons.  Thank you for allowing the Palliative Medicine Team to assist in the care of this patient.   Time In: 1415 Time Out: 1500   Total Time 45 mins Prolonged Time Billed no      Greater than 50%  of this time was spent counseling and coordinating care related to the above assessment and plan.  Mariana Kaufman, AGNP-C Palliative Medicine   Please contact Palliative Medicine Team phone at [phone] for questions and concerns.

## 2018-11-21 NOTE — Progress Notes (Signed)
PROGRESS NOTE  Chase Simmons [id number] DOB: [birth year] DOA: 11/13/2018 PCP: [name], MD   LOS: 8 days   Brief Narrative / Interim history: 85 year old male with advanced dementia, chronic systolic CHF, paroxysmal A. fib not on anticoagulation due to being a poor candidate, COPD, type 2 diabetes mellitus, recent admission for COVID-19 infection (10/15-10/21), who came into the hospital and was admitted on 11/13/2018 with altered mental status, hypoxia, hypotension.  CT angiogram on admission showed bilateral acute PEs with significant clot burden.  Patient was admitted to the ICU, vascular surgery was consulted and he underwent thrombolysis with bilateral pulmonary artery TPA.  He improved post procedure, and he was transferred to the floor on 11/16/2018  Subjective / 24h Interval events: Remains confused and picking up on things in the air, not interacting  Assessment & Plan: Principal Problem Acute respiratory failure with hypoxemia secondary to of massive bilateral pulmonary embolism, shock -Status post ICU stay, vascular surgery consulted and underwent thrombolysis -Required Levophed temporarily, now off and stable on the floor -Currently patient is on IV heparin infusion.  Just barely passed swallow eval today but he is at high risk for aspiration.  We will see how he takes p.o. intake and may be able to transition him to oral meds tomorrow  -Vascular surgery recommended IVC filter if patient was unable to take p.o. eventually, and they will follow-up as an outpatient, however active goals of care discussions are ongoing  Active Problems Dementia with severe delirium -Underlying dementia, had ICU delirium.  He was briefly on Precedex on 11/11 and became bradycardic.  Psychiatry was consulted. -Hold sedating medications as much as possible to allow patient to wake up  Hypernatremia -Secondary to dehydration, currently on dextrose infusion at low-dose to prevent fluid  overload.  Sodium is normalized, continue to monitor  Hypokalemia -Due to poor p.o. intake, replete IV  Chronic systolic CHF (EF [id number] on 01/16/2017) -Initially hypovolemia requiring IV fluids and pressors, remains euvolemic despite fluids, continue  Paroxysmal A. fib -Rate controlled, not a candidate for anticoagulation however will need blood thinners for the PE as detailed above  Type 2 diabetes mellitus -Continue gabapentin for neuropathy -Oral hypoglycemics on hold, continue sliding scale  CBG (last 3)  Recent Labs    11/21/18 0412 11/21/18 0736 11/21/18 1134  GLUCAP 133* 109* 152*   Hypertension -Stable blood pressure little bit on the high side but I suspect this is his baseline  Hyperlipidemia -Continue atorvastatin  COPD -No wheezing today  Nonobstructive CAD (by [facility] 2018) -Asymptomatic  Essential tremor -Continue primidone.  Goals of care -Palliative care following, ongoing discussions   Scheduled Meds: . amLODipine  2.5 mg Oral Daily  . atorvastatin  20 mg Oral q1800  . chlorhexidine  15 mL Mouth Rinse BID  . feeding supplement (NEPRO CARB STEADY)  237 mL Oral BID BM  . gabapentin  100 mg Oral QHS  . insulin aspart  0-9 Units Subcutaneous Q4H  . mouth rinse  15 mL Mouth Rinse q12n4p  . pantoprazole  40 mg Oral Daily  . PARoxetine  30 mg Oral Daily  . primidone  100 mg Oral TID  . QUEtiapine  100 mg Oral QHS  . QUEtiapine  50 mg Oral BID  . sodium chloride flush  3 mL Intravenous Q12H  . traZODone  100 mg Oral QHS   Continuous Infusions: . dextrose 50 mL/hr at 11/20/18 1656  . heparin 1,350 Units/hr (11/21/18 0725)  . potassium chloride 10 mEq (  11/21/18 1116)   PRN Meds:.haloperidol lactate, polyethylene glycol  DVT prophylaxis: Heparin infusion Code Status: DNR Family Communication: No family at bedside today Disposition Plan: To be determined  Consultants:  Vascular surgery Critical care  Procedures:  TPA  Microbiology   SARS-CoV-2 11/13/2018-positive Urine culture 11/14/2018-multiple species Blood cultures 11/13/2018-no growth  Antimicrobials: None     Objective: Vitals:   11/21/18 0405 11/21/18 0500 11/21/18 0553 11/21/18 0735  BP:   (!) 124/100 (!) 138/96  Pulse: 64  99 81  Resp:    18  Temp:    98.4 F (36.9 C)  TempSrc:    Oral  SpO2: 93%   95%  Weight:  75.7 kg    Height:        Intake/Output Summary (Last 24 hours) at 11/21/2018 1149 Last data filed at 11/21/2018 0500 Gross per 24 hour  Intake -  Output 75 ml  Net -75 ml   Filed Weights   11/19/18 0325 11/20/18 0520 11/21/18 0500  Weight: 73.8 kg 73.5 kg 75.7 kg    Examination:  Constitutional: Confused but NAD Respiratory: Clear, no wheezing, no crackles Cardiovascular: Irregular, no murmurs  Data Reviewed: I have independently reviewed following labs and imaging studies   CBC: Recent Labs  Lab 11/17/18 0743 11/18/18 0531 11/19/18 0453 11/20/18 0609 11/21/18 0354  WBC 6.4 6.4 6.6 8.1 8.6  HGB 9.8* 9.2* 9.0* 8.8* 9.3*  HCT 30.1* 28.9* 26.5* 27.3* 28.9*  MCV 105.6* 106.3* 101.5* 105.8* 104.3*  PLT 177 180 185 180 [id number]   Basic Metabolic Panel: Recent Labs  Lab 11/15/18 0607 11/16/18 0345 11/17/18 0547 11/18/18 0531 11/19/18 0453 11/21/18 0354  NA 151* 152* 148* 149* 144 140  K 3.5 3.7 3.4* 3.3* 3.1* 3.0*  CL 124* 122* 119* 120* 115* 113*  CO2 22 22 22 22  20* 21*  GLUCOSE 103* 122* 135* 120* 161* 139*  BUN 36* 28* 20 17 13 10   CREATININE 0.85 0.65 0.89 0.79 0.75 0.75  CALCIUM 7.7* 8.0* 7.8* 7.9* 7.7* 7.8*  MG 1.5* 1.7  --   --   --   --   PHOS  --  2.0*  --   --   --   --    Liver Function Tests: Recent Labs  Lab 11/14/18 1207 11/19/18 0453 11/21/18 0354  AST 21 17 18   ALT 12 11 11   ALKPHOS 73 71 82  BILITOT 0.7 1.0 1.0  PROT 5.9* 5.3* 5.7*  ALBUMIN 2.5* 2.2* 2.3*   Coagulation Profile: No results for input(s): INR, PROTIME in the last 168 hours. HbA1C: No results for input(s): HGBA1C in  the last 72 hours. CBG: Recent Labs  Lab 11/20/18 2014 11/21/18 0012 11/21/18 0412 11/21/18 0736 11/21/18 1134  GLUCAP 168* 149* 133* 109* 152*    Recent Results (from the past 240 hour(s))  SARS CORONAVIRUS 2 (TAT 6-24 HRS) Nasopharyngeal Nasopharyngeal Swab     Status: Abnormal   Collection Time: 11/13/18  2:57 PM   Specimen: Nasopharyngeal Swab  Result Value Ref Range Status   SARS Coronavirus 2 POSITIVE (A) NEGATIVE Final    Comment: RESULT CALLED TO, READ BACK BY AND VERIFIED WITH: M. CAMPBELL,RN 0146 11/14/2018 T. TYSOR (NOTE) SARS-CoV-2 target nucleic acids are DETECTED. The SARS-CoV-2 RNA is generally detectable in upper and lower respiratory specimens during the acute phase of infection. Positive results are indicative of active infection with SARS-CoV-2. Clinical  correlation with patient history and other diagnostic information is necessary to determine patient infection status. Positive  results do  not rule out bacterial infection or co-infection with other viruses. The expected result is Negative. Fact Sheet for Patients: SugarRoll.be Fact Sheet for Healthcare Providers: https://www.woods-mathews.com/ This test is not yet approved or cleared by the [place] FDA and  has been authorized for detection and/or diagnosis of SARS-CoV-2 by FDA under an Emergency Use Authorization (EUA). This EUA will remain  in effect (meaning this test can be used) f or the duration of the COVID-19 declaration under Section 564(b)(1) of the Act, 21 U.S.C. section 360bbb-3(b)(1), unless the authorization is terminated or revoked sooner. Performed at [facility], [place] [address] [place], [place] [address]   Culture, blood (Routine x 2)     Status: None (Preliminary result)   Collection Time: 11/13/18  2:59 PM   Specimen: BLOOD  Result Value Ref Range Status   Specimen Description BLOOD BLOOD RIGHT ARM  Final   Special Requests    Final    BOTTLES DRAWN AEROBIC AND ANAEROBIC Blood Culture adequate volume   Culture   Final    NO GROWTH 4 DAYS Performed at [facility], [address] [place], [place] [address]    Report Status PENDING  Incomplete  Blood Culture (routine x 2)     Status: None (Preliminary result)   Collection Time: 11/13/18  3:00 PM   Specimen: BLOOD  Result Value Ref Range Status   Specimen Description BLOOD BLOOD LEFT ARM  Final   Special Requests   Final    BOTTLES DRAWN AEROBIC AND ANAEROBIC Blood Culture adequate volume   Culture   Final    NO GROWTH 4 DAYS Performed at [facility], [address], [place], [place] [address]    Report Status PENDING  Incomplete  Urine Culture     Status: Abnormal   Collection Time: 11/14/18  1:41 AM   Specimen: Urine, Random  Result Value Ref Range Status   Specimen Description   Final    URINE, RANDOM Performed at [facility], [address], [place], [place] [address]    Special Requests   Final    NONE Performed at [facility], [address], [place], [place] [address]    Culture MULTIPLE SPECIES PRESENT, SUGGEST RECOLLECTION (A)  Final   Report Status 11/15/2018 FINAL  Final     Radiology Studies: No results found.   Marzetta Board, MD, PhD [facility]  Between 7 am - 7 pm I am available Contact me via Amion or Securechat  Between 7 pm - 7 am I am not available Contact night coverage MD/APP via [employer]

## 2018-11-21 NOTE — Progress Notes (Addendum)
ANTICOAGULATION CONSULT NOTE:  Pharmacy Consult for Heparin  Indication: pulmonary embolus  Allergies  Allergen Reactions  . Latex Itching   Patient Measurements: Height: 5\' 9"  (175.3 cm) Weight: 166 lb 14.2 oz (75.7 kg) IBW/kg (Calculated) : 70.7  Use TBW for heparin dosing.  Vital Signs: Temp: 98.4 F (36.9 C) (11/18 0735) Temp Source: Oral (11/18 0735) BP: 138/96 (11/18 0735) Pulse Rate: 81 (11/18 0735)  Labs: Recent Labs    11/19/18 0453 11/20/18 0609 11/21/18 0354 11/21/18 1452  HGB 9.0* 8.8* 9.3*  --   HCT 26.5* 27.3* 28.9*  --   PLT 185 180 209  --   HEPARINUNFRC 0.51 0.46 <0.10* 0.26*  CREATININE 0.75  --  0.75  --     Estimated Creatinine Clearance: 70 mL/min (by C-G formula based on SCr of 0.75 mg/dL).  Assessment: Pharmacy consulted to dose heparin in this 85 year old male admitted with PE.  No prior anticoagulation noted. H&H trending up, PLT WNL, baseline INR 1.2, aPTT 32s.  Heparin Course: 11/13 0345 HL 0.31, inc to 1000 units/hr 11/13 1356 HL 0.30, inc to 1100 units/hr 11/14 0012 HL 0.26, inc to 1250 units/hr 11/14 1131 HL 0.49, therapeutic x1 11/14 1938 HL 0.50, therapeutic x 2 11/15 0531 HL 0.50, therapeutic x 3 11/16 0453 HL 0.51, therapeutic x 4, CBC stable 11/18 1452 HL 0.26, subtherapeutic.   Goal of Therapy:  Heparin level 0.3-0.7 units/ml Monitor platelets by anticoagulation protocol: Yes   Plan:  Heparin level is subtherapeutic. Will give a 1000 unit bolus and  increase rate to 1450 units/hr since patient has a submassive PE in order to get him therapeutic and will recheck HL @ 0000, CBC appears stable will continue to monitor.  Pharmacy will continue to follow.   Oswald Hillock, PharmD, BCPS 11/21/2018 3:23 PM

## 2018-11-22 LAB — CBC
HCT: 26.3 % — ABNORMAL LOW (ref 39.0–52.0)
Hemoglobin: 8.6 g/dL — ABNORMAL LOW (ref 13.0–17.0)
MCH: 34.3 pg — ABNORMAL HIGH (ref 26.0–34.0)
MCHC: 32.7 g/dL (ref 30.0–36.0)
MCV: 104.8 fL — ABNORMAL HIGH (ref 80.0–100.0)
Platelets: 162 10*3/uL (ref 150–400)
RBC: 2.51 MIL/uL — ABNORMAL LOW (ref 4.22–5.81)
RDW: 15.9 % — ABNORMAL HIGH (ref 11.5–15.5)
WBC: 6.7 10*3/uL (ref 4.0–10.5)
nRBC: 0 % (ref 0.0–0.2)

## 2018-11-22 LAB — BASIC METABOLIC PANEL
Anion gap: 8 (ref 5–15)
BUN: 8 mg/dL (ref 8–23)
CO2: 19 mmol/L — ABNORMAL LOW (ref 22–32)
Calcium: 7.6 mg/dL — ABNORMAL LOW (ref 8.9–10.3)
Chloride: 110 mmol/L (ref 98–111)
Creatinine, Ser: 0.59 mg/dL — ABNORMAL LOW (ref 0.61–1.24)
GFR calc Af Amer: >60 mL/min (ref >60)
GFR calc non Af Amer: >60 mL/min (ref >60)
Glucose, Bld: 128 mg/dL — ABNORMAL HIGH (ref 70–99)
Potassium: 2.8 mmol/L — ABNORMAL LOW (ref 3.5–5.1)
Sodium: 137 mmol/L (ref 135–145)

## 2018-11-22 LAB — GLUCOSE, CAPILLARY
Glucose-Capillary: 113 mg/dL — ABNORMAL HIGH (ref 70–99)
Glucose-Capillary: 132 mg/dL — ABNORMAL HIGH (ref 70–99)
Glucose-Capillary: 141 mg/dL — ABNORMAL HIGH (ref 70–99)
Glucose-Capillary: 143 mg/dL — ABNORMAL HIGH (ref 70–99)
Glucose-Capillary: 174 mg/dL — ABNORMAL HIGH (ref 70–99)
Glucose-Capillary: 271 mg/dL — ABNORMAL HIGH (ref 70–99)
Glucose-Capillary: 88 mg/dL (ref 70–99)

## 2018-11-22 LAB — HEPARIN LEVEL (UNFRACTIONATED)
Heparin Unfractionated: 0.63 IU/mL (ref 0.30–0.70)
Heparin Unfractionated: 0.69 IU/mL (ref 0.30–0.70)
Heparin Unfractionated: 0.83 IU/mL — ABNORMAL HIGH (ref 0.30–0.70)

## 2018-11-22 MED ORDER — POTASSIUM CHLORIDE 2 MEQ/ML IV SOLN
INTRAVENOUS | Status: DC
  Administered 2018-11-22 – 2018-11-26 (×5): via INTRAVENOUS
  Filled 2018-11-22 (×7): qty 1000

## 2018-11-22 MED ORDER — POTASSIUM CHLORIDE 10 MEQ/100ML IV SOLN
10.0000 meq | INTRAVENOUS | Status: DC
  Filled 2018-11-22 (×6): qty 100

## 2018-11-22 MED ORDER — POTASSIUM CHLORIDE 10 MEQ/100ML IV SOLN
10.0000 meq | INTRAVENOUS | Status: AC
  Administered 2018-11-22 (×3): 10 meq via INTRAVENOUS
  Filled 2018-11-22 (×3): qty 100

## 2018-11-22 NOTE — Progress Notes (Signed)
ANTICOAGULATION CONSULT NOTE:  Pharmacy Consult for Heparin  Indication: pulmonary embolus  Allergies  Allergen Reactions  . Latex Itching   Patient Measurements: Height: 5\' 9"  (175.3 cm) Weight: 166 lb 14.2 oz (75.7 kg) IBW/kg (Calculated) : 70.7  Use TBW for heparin dosing.  Vital Signs: Temp: 98.6 F (37 C) (11/18 1926) Temp Source: Oral (11/18 1926) BP: 116/66 (11/18 1926) Pulse Rate: 96 (11/18 1926)  Labs: Recent Labs    11/19/18 0453 11/20/18 0609 11/21/18 0354 11/21/18 1452 11/22/18 0005  HGB 9.0* 8.8* 9.3*  --  8.6*  HCT 26.5* 27.3* 28.9*  --  26.3*  PLT 185 180 209  --  162  HEPARINUNFRC 0.51 0.46 <0.10* 0.26* 0.63  CREATININE 0.75  --  0.75  --   --     Estimated Creatinine Clearance: 70 mL/min (by C-G formula based on SCr of 0.75 mg/dL).  Assessment: Pharmacy consulted to dose heparin in this 85 year old male admitted with PE.  No prior anticoagulation noted. H&H trending up, PLT WNL, baseline INR 1.2, aPTT 32s.  Heparin Course: 11/13 0345 HL 0.31, inc to 1000 units/hr 11/13 1356 HL 0.30, inc to 1100 units/hr 11/14 0012 HL 0.26, inc to 1250 units/hr 11/14 1131 HL 0.49, therapeutic x1 11/14 1938 HL 0.50, therapeutic x 2 11/15 0531 HL 0.50, therapeutic x 3 11/16 0453 HL 0.51, therapeutic x 4, CBC stable 11/18 1452 HL 0.26, subtherapeutic.   Goal of Therapy:  Heparin level 0.3-0.7 units/ml Monitor platelets by anticoagulation protocol: Yes   Plan:  11/19 @ 0000 HL 0.63 therapeutic. Will continue current rate and will recheck HL at 0800, CBC trending down will continue to monitor.  Pharmacy will continue to follow.   Tobie Lords, PharmD, BCPS 11/22/2018 1:29 AM

## 2018-11-22 NOTE — Progress Notes (Signed)
PROGRESS NOTE  Chase Simmons [id number] DOB: [birth year] DOA: 11/13/2018 PCP: [name], MD   LOS: 9 days   Brief Narrative / Interim history: 85 year old male with advanced dementia, chronic systolic CHF, paroxysmal A. fib not on anticoagulation due to being a poor candidate, COPD, type 2 diabetes mellitus, recent admission for COVID-19 infection (10/15-10/21), who came into the hospital and was admitted on 11/13/2018 with altered mental status, hypoxia, hypotension.  CT angiogram on admission showed bilateral acute PEs with significant clot burden.  Patient was admitted to the ICU, vascular surgery was consulted and he underwent thrombolysis with bilateral pulmonary artery TPA.  He improved post procedure, and he was transferred to the floor on 11/16/2018.  Over the last 6 days patient has not had a meaningful recovery, was able to intermittently wake up in take a few bites of food but most time he is confused and not following any commands.  After talking to the family on 11/19/2018 they opted for an NG feeding tube.  Procedure failed patient having difficulties and struggling with insertion, family witnessed and decided against any feeding tube.  Ongoing goals of care discussions are being held on a daily basis by palliative care  Subjective / 24h Interval events: Confused, alert at times and eyes are open, no meaningful response on any of my questions  Assessment & Plan: Principal Problem Acute respiratory failure with hypoxemia secondary to of massive bilateral pulmonary embolism, shock -Status post ICU stay, vascular surgery consulted and underwent thrombolysis -Required Levophed temporarily, now off and stable on the floor -Currently patient is on IV heparin infusion.  Just barely passed swallow eval today but he is at high risk for aspiration.  We will see how he takes p.o. intake and may be able to transition him to oral meds tomorrow  -Vascular surgery recommended IVC filter  if patient was unable to take p.o. eventually, and they will follow-up as an outpatient, however active goals of care discussions are ongoing  Active Problems Dementia with severe delirium -Underlying dementia, had ICU delirium.  He was briefly on Precedex on 11/11 and became bradycardic.  Psychiatry was consulted. -Hold sedating medications as much as possible to allow patient to be awake  Hypernatremia -Secondary to dehydration, currently on dextrose infusion at low-dose to prevent fluid overload.  Sodium has now normalized  Hypokalemia -Due to poor p.o. intake, repleted daily yet potassium still decreasing, aggressively replete today with 60 mEq IV   Chronic systolic CHF (EF Q000111Q on 01/16/2017) -Initially hypovolemia requiring IV fluids and pressors, remains euvolemic despite fluids, continue  Paroxysmal A. fib -Rate controlled, not a candidate for anticoagulation however will need blood thinners for the PE as detailed above  Type 2 diabetes mellitus -Continue gabapentin for neuropathy -Oral hypoglycemics on hold, continue sliding scale  CBG (last 3)  Recent Labs    11/22/18 0039 11/22/18 0322 11/22/18 0813  GLUCAP 88 132* 141*   Hypertension -Stable blood pressure little bit on the high side but I suspect this is his baseline  Hyperlipidemia -Continue atorvastatin  COPD -No wheezing today  Nonobstructive CAD (by [facility] July 2018) -Asymptomatic  Essential tremor -Continue primidone.  Goals of care -Palliative care following, ongoing discussions   Scheduled Meds: . amLODipine  2.5 mg Oral Daily  . atorvastatin  20 mg Oral q1800  . chlorhexidine  15 mL Mouth Rinse BID  . feeding supplement (NEPRO CARB STEADY)  237 mL Oral BID BM  . gabapentin  100 mg Oral QHS  .  insulin aspart  0-9 Units Subcutaneous Q4H  . mouth rinse  15 mL Mouth Rinse q12n4p  . pantoprazole  40 mg Oral Daily  . PARoxetine  30 mg Oral Daily  . primidone  100 mg Oral TID  . QUEtiapine   100 mg Oral QHS  . QUEtiapine  50 mg Oral BID  . sodium chloride flush  3 mL Intravenous Q12H  . traZODone  100 mg Oral QHS   Continuous Infusions: . dextrose 50 mL/hr at 11/22/18 0911  . heparin 1,300 Units/hr (11/22/18 0916)  . potassium chloride     PRN Meds:.haloperidol lactate, polyethylene glycol, sodium chloride  DVT prophylaxis: Heparin infusion Code Status: DNR Family Communication: No family at bedside today Disposition Plan: To be determined  Consultants:  Vascular surgery Critical care  Procedures:  TPA  Microbiology  SARS-CoV-2 11/13/2018-positive Urine culture 11/14/2018-multiple species Blood cultures 11/13/2018-no growth  Antimicrobials: None     Objective: Vitals:   11/21/18 1545 11/21/18 1926 11/22/18 0327 11/22/18 0812  BP: (!) 133/59 116/66 111/71 107/60  Pulse: 92 96 78 99  Resp: 20 18 18 18   Temp: 98.9 F (37.2 C) 98.6 F (37 C) 98.7 F (37.1 C) 97.9 F (36.6 C)  TempSrc: Oral Oral Oral   SpO2: 93%  93% 99%  Weight:   75.9 kg   Height:        Intake/Output Summary (Last 24 hours) at 11/22/2018 1057 Last data filed at 11/22/2018 [id number] Gross per 24 hour  Intake 475.87 ml  Output 475 ml  Net 0.87 ml   Filed Weights   11/20/18 0520 11/21/18 0500 11/22/18 0327  Weight: 73.5 kg 75.7 kg 75.9 kg    Examination:  Constitutional: Confused but no distress Respiratory: Clear bilaterally without wheezing or crackles Cardiovascular: Irregular, no murmurs  Data Reviewed: I have independently reviewed following labs and imaging studies   CBC: Recent Labs  Lab 11/18/18 0531 11/19/18 0453 11/20/18 0609 11/21/18 0354 11/22/18 0005  WBC 6.4 6.6 8.1 8.6 6.7  HGB 9.2* 9.0* 8.8* 9.3* 8.6*  HCT 28.9* 26.5* 27.3* 28.9* 26.3*  MCV 106.3* 101.5* 105.8* 104.3* 104.8*  PLT 180 185 180 209 0000000   Basic Metabolic Panel: Recent Labs  Lab 11/16/18 0345 11/17/18 0547 11/18/18 0531 11/19/18 0453 11/21/18 0354 11/22/18 0544  NA 152* 148*  149* 144 140 137  K 3.7 3.4* 3.3* 3.1* 3.0* 2.8*  CL 122* 119* 120* 115* 113* 110  CO2 22 22 22  20* 21* 19*  GLUCOSE 122* 135* 120* 161* 139* 128*  BUN 28* 20 17 13 10 8   CREATININE 0.65 0.89 0.79 0.75 0.75 0.59*  CALCIUM 8.0* 7.8* 7.9* 7.7* 7.8* 7.6*  MG 1.7  --   --   --   --   --   PHOS 2.0*  --   --   --   --   --    Liver Function Tests: Recent Labs  Lab 11/19/18 0453 11/21/18 0354  AST 17 18  ALT 11 11  ALKPHOS 71 82  BILITOT 1.0 1.0  PROT 5.3* 5.7*  ALBUMIN 2.2* 2.3*   Coagulation Profile: No results for input(s): INR, PROTIME in the last 168 hours. HbA1C: No results for input(s): HGBA1C in the last 72 hours. CBG: Recent Labs  Lab 11/21/18 1647 11/21/18 2029 11/22/18 0039 11/22/18 0322 11/22/18 0813  GLUCAP 135* 201* 88 132* 141*    Recent Results (from the past 240 hour(s))  SARS CORONAVIRUS 2 (TAT 6-24 HRS) Nasopharyngeal Nasopharyngeal Swab  Status: Abnormal   Collection Time: 11/13/18  2:57 PM   Specimen: Nasopharyngeal Swab  Result Value Ref Range Status   SARS Coronavirus 2 POSITIVE (A) NEGATIVE Final    Comment: RESULT CALLED TO, READ BACK BY AND VERIFIED WITH: M. CAMPBELL,RN 0146 11/14/2018 T. TYSOR (NOTE) SARS-CoV-2 target nucleic acids are DETECTED. The SARS-CoV-2 RNA is generally detectable in upper and lower respiratory specimens during the acute phase of infection. Positive results are indicative of active infection with SARS-CoV-2. Clinical  correlation with patient history and other diagnostic information is necessary to determine patient infection status. Positive results do  not rule out bacterial infection or co-infection with other viruses. The expected result is Negative. Fact Sheet for Patients: SugarRoll.be Fact Sheet for Healthcare Providers: https://www.woods-mathews.com/ This test is not yet approved or cleared by the [place] FDA and  has been authorized for detection and/or  diagnosis of SARS-CoV-2 by FDA under an Emergency Use Authorization (EUA). This EUA will remain  in effect (meaning this test can be used) f or the duration of the COVID-19 declaration under Section 564(b)(1) of the Act, 21 U.S.C. section 360bbb-3(b)(1), unless the authorization is terminated or revoked sooner. Performed at [facility], [place] [address] [place], [place] [address]   Culture, blood (Routine x 2)     Status: None (Preliminary result)   Collection Time: 11/13/18  2:59 PM   Specimen: BLOOD  Result Value Ref Range Status   Specimen Description BLOOD BLOOD RIGHT ARM  Final   Special Requests   Final    BOTTLES DRAWN AEROBIC AND ANAEROBIC Blood Culture adequate volume   Culture   Final    NO GROWTH 4 DAYS Performed at [facility], [address], [place], [place] [address]    Report Status PENDING  Incomplete  Blood Culture (routine x 2)     Status: None (Preliminary result)   Collection Time: 11/13/18  3:00 PM   Specimen: BLOOD  Result Value Ref Range Status   Specimen Description BLOOD BLOOD LEFT ARM  Final   Special Requests   Final    BOTTLES DRAWN AEROBIC AND ANAEROBIC Blood Culture adequate volume   Culture   Final    NO GROWTH 4 DAYS Performed at [facility], [address] [place], [place] [address]    Report Status PENDING  Incomplete  Urine Culture     Status: Abnormal   Collection Time: 11/14/18  1:41 AM   Specimen: Urine, Random  Result Value Ref Range Status   Specimen Description   Final    URINE, RANDOM Performed at [facility], [address], [place], [place] [address]    Special Requests   Final    NONE Performed at [facility], [address], [place], [place] [address]    Culture MULTIPLE SPECIES PRESENT, SUGGEST RECOLLECTION (A)  Final   Report Status 11/15/2018 FINAL  Final     Radiology Studies: No results found.   Marzetta Board, MD, PhD [facility]  Between 7 am - 7 pm  I am available Contact me via Amion or Securechat  Between 7 pm - 7 am I am not available Contact night coverage MD/APP via [employer]

## 2018-11-22 NOTE — Progress Notes (Signed)
ANTICOAGULATION CONSULT NOTE:  Pharmacy Consult for Heparin  Indication: pulmonary embolus  Patient Measurements: Height: 5\' 9"  (175.3 cm) Weight: 167 lb 4.8 oz (75.9 kg) IBW/kg (Calculated) : 70.7  Use TBW for heparin dosing.  Vital Signs: Temp: 97.9 F (36.6 C) (11/19 0812) Temp Source: Oral (11/19 0327) BP: 107/60 (11/19 0812) Pulse Rate: 99 (11/19 0812)  Labs: Recent Labs    11/20/18 0609 11/21/18 0354 11/21/18 1452 11/22/18 0005 11/22/18 0544 11/22/18 0754  HGB 8.8* 9.3*  --  8.6*  --   --   HCT 27.3* 28.9*  --  26.3*  --   --   PLT 180 209  --  162  --   --   HEPARINUNFRC 0.46 <0.10* 0.26* 0.63  --  0.83*  CREATININE  --  0.75  --   --  0.59*  --     Estimated Creatinine Clearance: 70 mL/min (A) (by C-G formula based on SCr of 0.59 mg/dL (L)).  Assessment: Pharmacy consulted to dose heparin in this 85 year old male admitted with PE.  No prior anticoagulation noted. H&H trending up, PLT WNL, baseline INR 1.2, aPTT 32s.  Heparin Course: 11/13 0345 HL 0.31, inc to 1000 units/hr 11/13 1356 HL 0.30, inc to 1100 units/hr 11/14 0012 HL 0.26, inc to 1250 units/hr 11/14 1131 HL 0.49, therapeutic x1 11/14 1938 HL 0.50, therapeutic x 2 11/15 0531 HL 0.50, therapeutic x 3 11/16 0453 HL 0.51, therapeutic x 4, CBC stable 11/18 1452 HL 0.26, subtherapeutic  11/19 0000 HL 0.63 therapeutic @ 1450 units/hr 11/19 0754 HL 0.83: decrease to 1300 units/hr 11/19 1620 HL 0.69  Goal of Therapy:  Heparin level 0.3-0.7 units/ml Monitor platelets by anticoagulation protocol: Yes   Plan:   Heparin level is borderline high still: decrease rate to 1200 units/hr   recheck heparin level 8 hours after rate change has been made  CBC trending down will continue to monitor.  Pharmacy will continue to follow.   Dallie Piles, PharmD Clinical Pharmacist 11/22/2018 1:46 PM

## 2018-11-22 NOTE — Progress Notes (Signed)
ANTICOAGULATION CONSULT NOTE:  Pharmacy Consult for Heparin  Indication: pulmonary embolus  Allergies  Allergen Reactions  . Latex Itching   Patient Measurements: Height: 5\' 9"  (175.3 cm) Weight: 167 lb 4.8 oz (75.9 kg) IBW/kg (Calculated) : 70.7  Use TBW for heparin dosing.  Vital Signs: Temp: 97.9 F (36.6 C) (11/19 0812) Temp Source: Oral (11/19 0327) BP: 107/60 (11/19 0812) Pulse Rate: 99 (11/19 0812)  Labs: Recent Labs    11/20/18 0609 11/21/18 0354 11/21/18 1452 11/22/18 0005 11/22/18 0544 11/22/18 0754  HGB 8.8* 9.3*  --  8.6*  --   --   HCT 27.3* 28.9*  --  26.3*  --   --   PLT 180 209  --  162  --   --   HEPARINUNFRC 0.46 <0.10* 0.26* 0.63  --  0.83*  CREATININE  --  0.75  --   --  0.59*  --     Estimated Creatinine Clearance: 70 mL/min (A) (by C-G formula based on SCr of 0.59 mg/dL (L)).  Assessment: Pharmacy consulted to dose heparin in this 85 year old male admitted with PE.  No prior anticoagulation noted. H&H trending up, PLT WNL, baseline INR 1.2, aPTT 32s.  Heparin Course: 11/13 0345 HL 0.31, inc to 1000 units/hr 11/13 1356 HL 0.30, inc to 1100 units/hr 11/14 0012 HL 0.26, inc to 1250 units/hr 11/14 1131 HL 0.49, therapeutic x1 11/14 1938 HL 0.50, therapeutic x 2 11/15 0531 HL 0.50, therapeutic x 3 11/16 0453 HL 0.51, therapeutic x 4, CBC stable 11/18 1452 HL 0.26, subtherapeutic  11/19 0000 HL 0.63 therapeutic @ 1450 units/hr 11/19 0754 HL 0.83 supratherapeutic  Goal of Therapy:  Heparin level 0.3-0.7 units/ml Monitor platelets by anticoagulation protocol: Yes   Plan:  11/19 @ 0754 HL 0.83 supratherapeutic.   Will decrease current rate to 1300 units/hr and will recheck HL at 1600, CBC trending down will continue to monitor.  Pharmacy will continue to follow.   Lu Duffel, PharmD, BCPS Clinical Pharmacist 11/22/2018 9:21 AM

## 2018-11-22 NOTE — Progress Notes (Addendum)
Daily Progress Note   Patient Name: Chase Simmons       Date: 11/22/2018 DOB: 15-Oct-1933  Age: 85 y.o. MRN#: [id number] Attending Physician: Caren Griffins, MD Primary Care Physician: [name], MD Admit Date: 11/13/2018  Reason for Consultation/Follow-up: Establishing goals of care  Subjective: Patient in bed, sleeping. Daughter- [name] is at bedside. She states he was awake yesterday and spoke hers and [name]'s names. She notes he has not eaten yesterday or today, but says he is taking pills in applesauce.  Roselyn Reef emotional support and she shared her traumatic experience in this hospital with her fiancee's death last year.  She states that she is putting her father first, and doesn't want him to suffer, but she is afraid of making a decision "too soon".  We discussed the "what if"- I asked her what if he does not start to eat in a meaningful way. To that she replied- I guess we will have to go to Hospice.  She said she would like to continue a few more days with him taking the seroquel, as she is hopeful it will clear his mental status some to a point where he can take in more by mouth.   Review of Systems  Unable to perform ROS: Mental status change    Length of Stay: 9  Current Medications: Scheduled Meds:  . amLODipine  2.5 mg Oral Daily  . atorvastatin  20 mg Oral q1800  . chlorhexidine  15 mL Mouth Rinse BID  . feeding supplement (NEPRO CARB STEADY)  237 mL Oral BID BM  . gabapentin  100 mg Oral QHS  . insulin aspart  0-9 Units Subcutaneous Q4H  . mouth rinse  15 mL Mouth Rinse q12n4p  . pantoprazole  40 mg Oral Daily  . PARoxetine  30 mg Oral Daily  . primidone  100 mg Oral TID  . QUEtiapine  100 mg Oral QHS  . QUEtiapine  50 mg Oral BID  . sodium chloride  flush  3 mL Intravenous Q12H  . traZODone  100 mg Oral QHS    Continuous Infusions: . dextrose 10 % 1,000 mL with potassium chloride 40 mEq infusion    . heparin 1,300 Units/hr (11/22/18 0916)  . potassium chloride 10 mEq (11/22/18 1155)    PRN Meds: haloperidol lactate, polyethylene  glycol, sodium chloride  Physical Exam Vitals signs and nursing note reviewed.  Constitutional:      Comments: frail  Neurological:     Comments: Not opening eyes to voice or touch today             Vital Signs: BP 107/60 (BP Location: Right Arm)   Pulse 99   Temp 97.9 F (36.6 C)   Resp 18   Ht 5\' 9"  (1.753 m)   Wt 75.9 kg   SpO2 99%   BMI 24.71 kg/m  SpO2: SpO2: 99 % O2 Device: O2 Device: Room Air O2 Flow Rate: O2 Flow Rate (L/min): 2 L/min  Intake/output summary:   Intake/Output Summary (Last 24 hours) at 11/22/2018 1250 Last data filed at 11/22/2018 [id number] Gross per 24 hour  Intake 282.36 ml  Output 475 ml  Net -192.64 ml   LBM: Last BM Date: 11/21/18 Baseline Weight: Weight: 86.2 kg Most recent weight: Weight: 75.9 kg       Palliative Assessment/Data: PPS: 20%    Flowsheet Rows     Most Recent Value  Intake Tab  Referral Department  Critical care  Unit at Time of Referral  ICU  Palliative Care Primary Diagnosis  Cardiac  Date Notified  11/14/18  Palliative Care Type  New Palliative care  Reason for referral  Clarify Goals of Care  Date of Admission  11/13/18  Date first seen by Palliative Care  11/14/18  # of days Palliative referral response time  0 Day(s)  # of days IP prior to Palliative referral  1  Clinical Assessment  Psychosocial & Spiritual Assessment  Palliative Care Outcomes      Patient Active Problem List   Diagnosis Date Noted  . Oropharyngeal dysphagia   . Advanced care planning/counseling discussion   . Pressure injury of skin 11/14/2018  . Palliative care by specialist   . Hypotension   . Agitation   . Dementia with behavioral disturbance  ([place])   . Pulmonary embolism ([place]) 11/13/2018  . Acute massive pulmonary embolism ([place]) 11/13/2018  . COVID-19 10/19/2018  . COVID-19 virus infection 10/18/2018  . Weakness 06/07/2018  . Skin tear of elbow without complication [id number]  . Lactic acidosis 06/06/2018  . Depression 04/20/2018  . Altered mental status   . Frequency of urination 03/01/2018  . Confusion 03/01/2018  . Bilateral leg edema 11/08/2016  . Vertigo 08/25/2016  . Nausea 08/01/2016  . CHF (congestive heart failure) ([place]) 08/01/2016  . Atypical chest pain 07/20/2016  . COPD (chronic obstructive pulmonary disease) ([place]) 03/15/2016  . Coronary atherosclerosis of native coronary artery 01/05/2016  . Aortic atherosclerosis ([place]) 01/05/2016  . Bradycardia 01/05/2016  . Anxiety 06/17/2015  . Malignant neoplasm of urinary bladder ([place]) 06/17/2015  . Allergic rhinitis 05/14/2015  . Knee pain, bilateral 03/16/2015  . PAF (paroxysmal atrial fibrillation) ([place]) 08/30/2013  . Acid reflux 08/30/2013  . Goals of care, counseling/discussion 03/04/2013  . Tremor, essential 10/22/2012  . Hyperlipidemia 10/22/2012  . HTN (hypertension) 08/01/2012  . Type 2 diabetes mellitus with complication, without long-term current use of insulin ([place]) 08/01/2012  . Benign prostatic hyperplasia with urinary obstruction 09/02/2011    Palliative Care Assessment & Plan   Patient Profile: 85 y.o.malewith past medical history of dementia, chronic systolic heart failure, paroxysmal atrial fibrillation (not on anticoagulation), HTN, COPD, type 2 DM, recent covid admission at [facility] 10/15-10/21admitted on 11/10/2020with AMS and hypoxia.CT angiogram of chest showed bilateral acute PE with significant clot burden. Vascular surgery consulted and  patient underwent thrombolysis in bilateral pulmonary artery with TPA. Hypotensive on levophed. Palliative medicine consultation for goals of care.  Assessment/Recommendations/Plan   Continue current  scope care  Patient's children continue to request "a few more days" to look for improvement, but are coming to realization that patient is not likely to recover-   Today for [name] to say that Hospice would be a possible option is quite a step for her as she has been adamantly opposed to Hospice in the past  PMT will continue to discuss and support this family through their decision making- I expect that patient will have some decline or may develop pnuemonia that may make their decisions much more clear cut  Goals of Care and Additional Recommendations:  Limitations on Scope of Treatment: Full Scope Treatment and Initiate Comfort Feeding  Code Status:  DNR  Prognosis:   < 2 weeks  Discharge Planning:  To Be Determined  Care plan was discussed with patient's daughter- [name]  Thank you for allowing the Palliative Medicine Team to assist in the care of this patient.   Time In: 1000 Time Out: 1035   Total Time 35 mins Prolonged Time Billed no      Greater than 50%  of this time was spent counseling and coordinating care related to the above assessment and plan.  Mariana Kaufman, AGNP-C Palliative Medicine   Please contact Palliative Medicine Team phone at [phone] for questions and concerns.

## 2018-11-22 NOTE — Care Management Important Message (Signed)
Important Message  Patient Details  Name: Chase Simmons MRN: [id number] Date of Birth: 18-Sep-1933   Medicare Important Message Given:  Yes     Dannette Barbara 11/22/2018, 1:34 PM

## 2018-11-23 DIAGNOSIS — R1312 Dysphagia, oropharyngeal phase: Secondary | ICD-10-CM

## 2018-11-23 LAB — HEPARIN LEVEL (UNFRACTIONATED)
Heparin Unfractionated: 0.27 IU/mL — ABNORMAL LOW (ref 0.30–0.70)
Heparin Unfractionated: 0.5 IU/mL (ref 0.30–0.70)

## 2018-11-23 LAB — GLUCOSE, CAPILLARY
Glucose-Capillary: 142 mg/dL — ABNORMAL HIGH (ref 70–99)
Glucose-Capillary: 138 mg/dL — ABNORMAL HIGH (ref 70–99)
Glucose-Capillary: 198 mg/dL — ABNORMAL HIGH (ref 70–99)
Glucose-Capillary: 171 mg/dL — ABNORMAL HIGH (ref 70–99)
Glucose-Capillary: 150 mg/dL — ABNORMAL HIGH (ref 70–99)

## 2018-11-23 LAB — CBC
HCT: 26.1 % — ABNORMAL LOW (ref 39.0–52.0)
Hemoglobin: 8.7 g/dL — ABNORMAL LOW (ref 13.0–17.0)
MCH: 33.9 pg (ref 26.0–34.0)
MCHC: 33.3 g/dL (ref 30.0–36.0)
MCV: 101.6 fL — ABNORMAL HIGH (ref 80.0–100.0)
Platelets: 165 10*3/uL (ref 150–400)
RBC: 2.57 MIL/uL — ABNORMAL LOW (ref 4.22–5.81)
RDW: 16.5 % — ABNORMAL HIGH (ref 11.5–15.5)
WBC: 8.7 10*3/uL (ref 4.0–10.5)
nRBC: 0 % (ref 0.0–0.2)

## 2018-11-23 LAB — BASIC METABOLIC PANEL
Anion gap: 9 (ref 5–15)
BUN: 7 mg/dL — ABNORMAL LOW (ref 8–23)
CO2: 20 mmol/L — ABNORMAL LOW (ref 22–32)
Calcium: 7.6 mg/dL — ABNORMAL LOW (ref 8.9–10.3)
Chloride: 109 mmol/L (ref 98–111)
Creatinine, Ser: 0.8 mg/dL (ref 0.61–1.24)
GFR calc Af Amer: >60 mL/min (ref >60)
GFR calc non Af Amer: >60 mL/min (ref >60)
Glucose, Bld: 147 mg/dL — ABNORMAL HIGH (ref 70–99)
Potassium: 3.6 mmol/L (ref 3.5–5.1)
Sodium: 138 mmol/L (ref 135–145)

## 2018-11-23 NOTE — Progress Notes (Signed)
PROGRESS NOTE  Chase Simmons [id number] DOB: 05/11/1933 DOA: 11/13/2018 PCP: [name], MD  HPI/Recap of past 21 hours: 85 year old male with past medical history of Parkinson's disease with dementia, chronic systolic CHF, paroxysmal A. fib and COPD previous admission for Covid infection 1 month prior who was admitted on 11/10 with hypoxia, hypotension and altered mental status.  Patient was found to have bilateral acute pulmonary embolus.  He was admitted to the ICU or vascular surgery was consulted and patient went thrombolysis with bilateral pulmonary artery TPA.  Stabilized and transferred to a medical floor on 11/13.  Since then, patient's course noted delayed recovery with increased somnolence and very poor p.o. intake.  After discussion with family, an NG tube was attempted to be placed on 11/16.  This was unable to done after difficulties and struggling with insertion.  Since then, patient since morning of 11/20 has become more awake and is eating somewhat.  After discussion with family, they would like to see if this improvement continues, and then plan to take patient home.    Patient himself nonverbal to me, does not follow commands.  Does not appear to be in any acute distress.   Assessment/Plan: Active Problems:   Goals of care, counseling/discussion   Pulmonary embolism (HCC) Acute respiratory failure with hypoxia secondary to acute massive bilateral pulmonary embolism [facility]): Status post thrombolysis by vascular surgery.  Briefly required pressor support, now weaned off and stabilized.  Currently on IV heparin infusion.  Appears to be slowly improving and hoping to transition to oral medications in the next 24 hours.  If patient is unable to take p.o. consistently and family does not opt for comfort care, would need IVC filter.  COPD: Stable.    Hypotension in the setting of essential hypertension: Stable for now.  Resolved once off of pressor support.  Dementia  with acute severe delirium: Briefly on Precedex on 11/11.  Psychiatry consulted.  Sedating medications held, allowing patient be more awake.    Oropharyngeal dysphagia: In part due to his acute delirium, but also from his worsening dementia.  He seems to be a bit more alert and tolerating some p.o.  Hopefully this will maintain.  Paroxysmal A. fib: Rate controlled.  Not a candidate for anticoagulation, but will need blood thinning medication for PE as above.  Chronic systolic CHF: Mild decrease in EF, 45 to 50% as of 01/16/2017.  Initially slightly hypovolemic.  Now currently euvolemic.  Essential tremor: Continue primidone  Type 2 diabetes mellitus: On gabapentin for neuropathy.  Oral hypoglycemics on hold, sliding scale only.  Code Status: DNR  Family Communication: Daughter at the bedside  Disposition Plan: Potential discharge home with 24-hour supervision in the next few days if patient is able to maintain alertness and ability to eat.   Consultants:  Vascular surgery  Psychiatry  Palliative care  Procedures:  TPA done 11/11  Antimicrobials:  None  DVT prophylaxis: IV heparin   Objective: Vitals:   11/23/18 0501 11/23/18 0754  BP: (!) 114/53 (!) 115/55  Pulse: 81 76  Resp: 18   Temp: (!) 97.5 F (36.4 C) 98.9 F (37.2 C)  SpO2: 97% 99%    Intake/Output Summary (Last 24 hours) at 11/23/2018 1441 Last data filed at 11/23/2018 0851 Gross per 24 hour  Intake 549.49 ml  Output 425 ml  Net 124.49 ml   Filed Weights   11/21/18 0500 11/22/18 0327 11/23/18 0500  Weight: 75.7 kg 75.9 kg 76.5 kg   Body  mass index is 24.91 kg/m.  Exam:   General: Delirium, awake, no acute distress  HEENT: Normocephalic, atraumatic, mucous membranes are slightly dry  Neck: Supple, no JVD  Cardiovascular: Irregular rhythm, rate controlled  Respiratory: Few Rales, poor inspiratory effort  Abdomen: Soft, nondistended, hypoactive bowel sounds  Musculoskeletal: No  clubbing or cyanosis, 1+ pitting edema  Neuro: Resting tremor  Psychiatry: Acutely delirious   Data Reviewed: CBC: Recent Labs  Lab 11/19/18 0453 11/20/18 0609 11/21/18 0354 11/22/18 0005 11/23/18 0717  WBC 6.6 8.1 8.6 6.7 8.7  HGB 9.0* 8.8* 9.3* 8.6* 8.7*  HCT 26.5* 27.3* 28.9* 26.3* 26.1*  MCV 101.5* 105.8* 104.3* 104.8* 101.6*  PLT 185 180 209 162 [id number]   Basic Metabolic Panel: Recent Labs  Lab 11/18/18 0531 11/19/18 0453 11/21/18 0354 11/22/18 0544 11/23/18 0717  NA 149* 144 140 137 138  K 3.3* 3.1* 3.0* 2.8* 3.6  CL 120* 115* 113* 110 109  CO2 22 20* 21* 19* 20*  GLUCOSE 120* 161* 139* 128* 147*  BUN 17 13 10 8  7*  CREATININE 0.79 0.75 0.75 0.59* 0.80  CALCIUM 7.9* 7.7* 7.8* 7.6* 7.6*   GFR: Estimated Creatinine Clearance: 70 mL/min (by C-G formula based on SCr of 0.8 mg/dL). Liver Function Tests: Recent Labs  Lab 11/19/18 0453 11/21/18 0354  AST 17 18  ALT 11 11  ALKPHOS 71 82  BILITOT 1.0 1.0  PROT 5.3* 5.7*  ALBUMIN 2.2* 2.3*   No results for input(s): LIPASE, AMYLASE in the last 168 hours. No results for input(s): AMMONIA in the last 168 hours. Coagulation Profile: No results for input(s): INR, PROTIME in the last 168 hours. Cardiac Enzymes: No results for input(s): CKTOTAL, CKMB, CKMBINDEX, TROPONINI in the last 168 hours. BNP (last 3 results) No results for input(s): PROBNP in the last 8760 hours. HbA1C: No results for input(s): HGBA1C in the last 72 hours. CBG: Recent Labs  Lab 11/22/18 1926 11/22/18 2318 11/23/18 0427 11/23/18 0756 11/23/18 1146  GLUCAP 113* 143* 142* 138* 198*   Lipid Profile: No results for input(s): CHOL, HDL, LDLCALC, TRIG, CHOLHDL, LDLDIRECT in the last 72 hours. Thyroid Function Tests: No results for input(s): TSH, T4TOTAL, FREET4, T3FREE, THYROIDAB in the last 72 hours. Anemia Panel: No results for input(s): VITAMINB12, FOLATE, FERRITIN, TIBC, IRON, RETICCTPCT in the last 72 hours. Urine analysis:     Component Value Date/Time   COLORURINE AMBER (A) 11/13/2018 1457   APPEARANCEUR CLOUDY (A) 11/13/2018 1457   APPEARANCEUR Clear 10/24/2017 1538   LABSPEC 1.030 11/13/2018 1457   PHURINE 5.0 11/13/2018 1457   GLUCOSEU NEGATIVE 11/13/2018 1457   HGBUR NEGATIVE 11/13/2018 [place] 11/13/2018 1457   BILIRUBINUR negative 02/27/2018 1636   BILIRUBINUR Negative 10/24/2017 [place] 11/13/2018 1457   PROTEINUR 30 (A) 11/13/2018 1457   UROBILINOGEN 0.2 02/27/2018 1636   NITRITE NEGATIVE 11/13/2018 1457   LEUKOCYTESUR NEGATIVE 11/13/2018 1457   Sepsis Labs: @LABRCNTIP (procalcitonin:4,lacticidven:4)  ) Recent Results (from the past 240 hour(s))  SARS CORONAVIRUS 2 (TAT 6-24 HRS) Nasopharyngeal Nasopharyngeal Swab     Status: Abnormal   Collection Time: 11/13/18  2:57 PM   Specimen: Nasopharyngeal Swab  Result Value Ref Range Status   SARS Coronavirus 2 POSITIVE (A) NEGATIVE Final    Comment: RESULT CALLED TO, READ BACK BY AND VERIFIED WITH: M. CAMPBELL,RN 0146 11/14/2018 T. TYSOR (NOTE) SARS-CoV-2 target nucleic acids are DETECTED. The SARS-CoV-2 RNA is generally detectable in upper and lower respiratory specimens during the acute  phase of infection. Positive results are indicative of active infection with SARS-CoV-2. Clinical  correlation with patient history and other diagnostic information is necessary to determine patient infection status. Positive results do  not rule out bacterial infection or co-infection with other viruses. The expected result is Negative. Fact Sheet for Patients: SugarRoll.be Fact Sheet for Healthcare Providers: https://www.woods-mathews.com/ This test is not yet approved or cleared by the [place] FDA and  has been authorized for detection and/or diagnosis of SARS-CoV-2 by FDA under an Emergency Use Authorization (EUA). This EUA will remain  in effect (meaning this test can be  used) f or the duration of the COVID-19 declaration under Section 564(b)(1) of the Act, 21 U.S.C. section 360bbb-3(b)(1), unless the authorization is terminated or revoked sooner. Performed at [facility], [place] [address] [place], [place] [address]   Culture, blood (Routine x 2)     Status: None (Preliminary result)   Collection Time: 11/13/18  2:59 PM   Specimen: BLOOD  Result Value Ref Range Status   Specimen Description BLOOD BLOOD RIGHT ARM  Final   Special Requests   Final    BOTTLES DRAWN AEROBIC AND ANAEROBIC Blood Culture adequate volume   Culture   Final    NO GROWTH 4 DAYS Performed at [facility], [address], [place], [place] [address]    Report Status PENDING  Incomplete  Blood Culture (routine x 2)     Status: None (Preliminary result)   Collection Time: 11/13/18  3:00 PM   Specimen: BLOOD  Result Value Ref Range Status   Specimen Description BLOOD BLOOD LEFT ARM  Final   Special Requests   Final    BOTTLES DRAWN AEROBIC AND ANAEROBIC Blood Culture adequate volume   Culture   Final    NO GROWTH 4 DAYS Performed at [facility], [address], [place], [place] [address]    Report Status PENDING  Incomplete  Urine Culture     Status: Abnormal   Collection Time: 11/14/18  1:41 AM   Specimen: Urine, Random  Result Value Ref Range Status   Specimen Description   Final    URINE, RANDOM Performed at [facility], [address], [place], [place] [address]    Special Requests   Final    NONE Performed at [facility], [address] [place], [place] [address]    Culture MULTIPLE SPECIES PRESENT, SUGGEST RECOLLECTION (A)  Final   Report Status 11/15/2018 FINAL  Final      Studies: No results found.  Scheduled Meds: . amLODipine  2.5 mg Oral Daily  . atorvastatin  20 mg Oral q1800  . chlorhexidine  15 mL Mouth Rinse BID  . feeding supplement (NEPRO CARB STEADY)  237 mL Oral BID BM  . gabapentin  100 mg  Oral QHS  . insulin aspart  0-9 Units Subcutaneous Q4H  . mouth rinse  15 mL Mouth Rinse q12n4p  . pantoprazole  40 mg Oral Daily  . PARoxetine  30 mg Oral Daily  . primidone  100 mg Oral TID  . QUEtiapine  100 mg Oral QHS  . QUEtiapine  50 mg Oral BID  . sodium chloride flush  3 mL Intravenous Q12H  . traZODone  100 mg Oral QHS    Continuous Infusions: . dextrose 10 % 1,000 mL with potassium chloride 40 mEq infusion 50 mL/hr at 11/23/18 1200  . heparin 1,250 Units/hr (11/23/18 0821)     LOS: 10 days  Annita Brod, MD [facility]  To reach me or the doctor on call, go to: [email or web address] Password [facility]  11/23/2018, 2:41 PM

## 2018-11-23 NOTE — Progress Notes (Signed)
ANTICOAGULATION CONSULT NOTE:  Pharmacy Consult for Heparin  Indication: pulmonary embolus  Patient Measurements: Height: 5\' 9"  (175.3 cm) Weight: 168 lb 11.2 oz (76.5 kg) IBW/kg (Calculated) : 70.7  Use TBW for heparin dosing.  Vital Signs: Temp: 98.2 F (36.8 C) (11/20 1620) Temp Source: Oral (11/20 1620) BP: 120/97 (11/20 1620) Pulse Rate: 95 (11/20 1658)  Labs: Recent Labs    11/21/18 0354  11/22/18 0005 11/22/18 0544  11/22/18 1620 11/23/18 0717 11/23/18 1738  HGB 9.3*  --  8.6*  --   --   --  8.7*  --   HCT 28.9*  --  26.3*  --   --   --  26.1*  --   PLT 209  --  162  --   --   --  165  --   HEPARINUNFRC <0.10*   < > 0.63  --    < > 0.69 0.27* 0.50  CREATININE 0.75  --   --  0.59*  --   --  0.80  --    < > = values in this interval not displayed.    Estimated Creatinine Clearance: 70 mL/min (by C-G formula based on SCr of 0.8 mg/dL).  Assessment: Pharmacy consulted to dose heparin in this 85 year old male admitted with PE.  No prior anticoagulation noted. H&H trending up, PLT WNL, baseline INR 1.2, aPTT 32s.  Heparin Course: 11/13 0345 HL 0.31, inc to 1000 units/hr 11/13 1356 HL 0.30, inc to 1100 units/hr 11/14 0012 HL 0.26, inc to 1250 units/hr 11/14 1131 HL 0.49, therapeutic x1 11/14 1938 HL 0.50, therapeutic x 2 11/15 0531 HL 0.50, therapeutic x 3 11/16 0453 HL 0.51, therapeutic x 4, CBC stable 11/18 1452 HL 0.26, subtherapeutic  11/19 0000 HL 0.63 therapeutic @ 1450 units/hr 11/19 0754 HL 0.83: decrease to 1300 units/hr 11/19 1620 HL 0.69  Decrease to 1200 units/hr 11/20 0717 HL 0.27 increase to 1250 units/hr 11/20 1755 HL 0.50. Therapeutic, though significant increase s/sp rate increase. Confirmed no interruptions.   Goal of Therapy:  Heparin level 0.3-0.7 units/ml Monitor platelets by anticoagulation protocol: Yes   Plan:   Heparin: continue current rate 1250 units/hr   Recheck heparin level 8 hours   CBC trending down will continue to  monitor.  Pharmacy will continue to follow.   Rowland Lathe, PharmD Clinical Pharmacist 11/23/2018 6:10 PM

## 2018-11-23 NOTE — Progress Notes (Signed)
ANTICOAGULATION CONSULT NOTE:  Pharmacy Consult for Heparin  Indication: pulmonary embolus  Patient Measurements: Height: 5\' 9"  (175.3 cm) Weight: 168 lb 11.2 oz (76.5 kg) IBW/kg (Calculated) : 70.7  Use TBW for heparin dosing.  Vital Signs: Temp: 98.9 F (37.2 C) (11/20 0754) Temp Source: Oral (11/20 0754) BP: 115/55 (11/20 0754) Pulse Rate: 76 (11/20 0754)  Labs: Recent Labs    11/21/18 0354  11/22/18 0005 11/22/18 0544 11/22/18 0754 11/22/18 1620 11/23/18 0717  HGB 9.3*  --  8.6*  --   --   --   --   HCT 28.9*  --  26.3*  --   --   --   --   PLT 209  --  162  --   --   --   --   HEPARINUNFRC <0.10*   < > 0.63  --  0.83* 0.69 0.27*  CREATININE 0.75  --   --  0.59*  --   --  0.80   < > = values in this interval not displayed.    Estimated Creatinine Clearance: 70 mL/min (by C-G formula based on SCr of 0.8 mg/dL).  Assessment: Pharmacy consulted to dose heparin in this 85 year old male admitted with PE.  No prior anticoagulation noted. H&H trending up, PLT WNL, baseline INR 1.2, aPTT 32s.  Heparin Course: 11/13 0345 HL 0.31, inc to 1000 units/hr 11/13 1356 HL 0.30, inc to 1100 units/hr 11/14 0012 HL 0.26, inc to 1250 units/hr 11/14 1131 HL 0.49, therapeutic x1 11/14 1938 HL 0.50, therapeutic x 2 11/15 0531 HL 0.50, therapeutic x 3 11/16 0453 HL 0.51, therapeutic x 4, CBC stable 11/18 1452 HL 0.26, subtherapeutic  11/19 0000 HL 0.63 therapeutic @ 1450 units/hr 11/19 0754 HL 0.83: decrease to 1300 units/hr 11/19 1620 HL 0.69  Decrease to 1200 units/hr 11/20 0717 HL 0.27 increase to 1250 units/hr  Goal of Therapy:  Heparin level 0.3-0.7 units/ml Monitor platelets by anticoagulation protocol: Yes   Plan:   Heparin level back to low @ 0.27: increase rate to 1250 units/hr   recheck heparin level 8 hours after rate change has been made  CBC trending down will continue to monitor.  Pharmacy will continue to follow.   Lu Duffel, PharmD Clinical  Pharmacist 11/23/2018 8:12 AM

## 2018-11-23 NOTE — Progress Notes (Signed)
SLP Cancellation Note  Patient Details Name: Chase Simmons MRN: [id number] DOB: 1933/06/11   Cancelled treatment:       Reason Eval/Treat Not Completed: (chart reviewed; consulted NSG re: pt's status today). Per NSG, pt has been able to awaken sufficiently to take bites/sips of the recommended Dysphagia diet -- level 1 (puree) w/ Nectar consistency liquids vis TSP. No immediate decline in respiratory status or discomfort noted w/ the po intake today. Per MD note, pt has "worsening Dementia but seems to be a bit more alert and tolerating some p.o. today. Hopefully this will maintain.". Pt may potentially discharge home with 24-hour supervision in the next few days if able to maintain alertness and ability to eat per MD. Palliative Care following; Hospice support discussion ongoing d/t complications of worsening Dementia.  ST services can be available for any education, supplying thickened liquids for home discharge when appropriate. NO changes in diet consistency are recommended at this time d/t pt's current toleration of bites/sips, and his increased risk for aspiration. Recommend monitoring at meals; oral care b/f and after meals. NSG updated, agreed.     Orinda Kenner, MS, CCC-SLP Chase Simmons,Chase Simmons 11/23/2018, 3:21 PM

## 2018-11-23 NOTE — TOC Progression Note (Signed)
Transition of Care [facility]) - Progression Note    Patient Details  Name: Chase Simmons MRN: [id number] Date of Birth: 05-27-1933  Transition of Care [facility]) CM/SW Contact  Ross Ludwig, [place] Phone Number: 11/23/2018, 6:13 PM  Clinical Narrative:    CSW continuing to follow patient, family waiting to see if patient improves enough to eventually return home with home health or hospice services, verse going back to [facility].  CSW continuing to follow patient's progress throughout discharge planning.   Expected Discharge Plan: [place] Barriers to Discharge: Continued Medical Work up  Expected Discharge Plan and Services Expected Discharge Plan: [place] In-house Referral: Hospice / [place], Clinical Social Work     Living arrangements for the past 2 months: [place]                                       Social Determinants of Health (SDOH) Interventions    Readmission Risk Interventions Readmission Risk Prevention Plan 11/16/2018 06/07/2018  Transportation Screening Complete Complete  PCP or Specialist Appt within 3-5 Days - Complete  HRI or [place] - Complete  Social Work Consult for [place] Planning/Counseling - Not Complete  Medication Review [employer] photographer) - Complete  PCP or Specialist appointment within 3-5 days of discharge Complete -  [place] or [place] Complete -  SW Recovery Care/Counseling Consult Complete -  Palliative Care Screening Complete -  [place] Not Applicable -  Some recent data might be hidden

## 2018-11-24 LAB — GLUCOSE, CAPILLARY
Glucose-Capillary: 123 mg/dL — ABNORMAL HIGH (ref 70–99)
Glucose-Capillary: 127 mg/dL — ABNORMAL HIGH (ref 70–99)
Glucose-Capillary: 127 mg/dL — ABNORMAL HIGH (ref 70–99)
Glucose-Capillary: 142 mg/dL — ABNORMAL HIGH (ref 70–99)
Glucose-Capillary: 156 mg/dL — ABNORMAL HIGH (ref 70–99)
Glucose-Capillary: 158 mg/dL — ABNORMAL HIGH (ref 70–99)

## 2018-11-24 LAB — CBC
HCT: 24.4 % — ABNORMAL LOW (ref 39.0–52.0)
Hemoglobin: 8.1 g/dL — ABNORMAL LOW (ref 13.0–17.0)
MCH: 34.8 pg — ABNORMAL HIGH (ref 26.0–34.0)
MCHC: 33.2 g/dL (ref 30.0–36.0)
MCV: 104.7 fL — ABNORMAL HIGH (ref 80.0–100.0)
Platelets: 145 10*3/uL — ABNORMAL LOW (ref 150–400)
RBC: 2.33 MIL/uL — ABNORMAL LOW (ref 4.22–5.81)
RDW: 17.1 % — ABNORMAL HIGH (ref 11.5–15.5)
WBC: 5.7 10*3/uL (ref 4.0–10.5)
nRBC: 0 % (ref 0.0–0.2)

## 2018-11-24 LAB — CULTURE, BLOOD (ROUTINE X 2)
Culture: NO GROWTH
Culture: NO GROWTH
Special Requests: ADEQUATE
Special Requests: ADEQUATE

## 2018-11-24 LAB — HEPARIN LEVEL (UNFRACTIONATED): Heparin Unfractionated: 0.57 IU/mL (ref 0.30–0.70)

## 2018-11-24 NOTE — Progress Notes (Signed)
PROGRESS NOTE  Chase Simmons [id number] DOB: [birth year] DOA: 11/13/2018 PCP: [name], MD  HPI/Recap of past 28 hours: 85 year old male with past medical history of Parkinson's disease with dementia, chronic systolic CHF, paroxysmal A. fib and COPD previous admission for Covid infection 1 month prior who was admitted on 11/10 with hypoxia, hypotension and altered mental status.  Patient was found to have bilateral acute pulmonary embolus.  He was admitted to the ICU or vascular surgery was consulted and patient went thrombolysis with bilateral pulmonary artery TPA.  Stabilized and transferred to a medical floor on 11/13.  Since then, patient's course noted delayed recovery with increased somnolence and very poor p.o. intake.  After discussion with family, an NG tube was attempted to be placed on 11/16.  This was unable to done after difficulties and struggling with insertion.  On morning of 11/20 has become more awake and is eating somewhat.  After discussion with family, they would like to see if this improvement continues, and then plan to take patient home.    However, today, 11/21, patient much more somnolent.  Does not really awaken.  Noted to have more PVCs on telemetry   Assessment/Plan: Active Problems:   Goals of care, counseling/discussion   Pulmonary embolism (HCC) Acute respiratory failure with hypoxia secondary to acute massive bilateral pulmonary embolism [facility]): Status post thrombolysis by vascular surgery.  Briefly required pressor support, now weaned off and stabilized.  Currently on IV heparin infusion.  Briefly appeared to be slowly improving and hoping to transition to oral medications in the next 24 hours.  If patient is unable to take p.o. consistently and family does not opt for comfort care, would need IVC filter.  COPD: Stable.    Hypotension in the setting of essential hypertension: Stable for now.  Resolved once off of pressor support.  Dementia with  acute severe delirium: Briefly on Precedex on 11/11.  Psychiatry consulted.  Sedating medications held, allowing patient be more awake.  However, patient appears more somnolent today.  Continue to monitor    Oropharyngeal dysphagia: In part due to his acute delirium, but also from his worsening dementia.  He seems to be a bit more alert and tolerating some p.o.  Hopefully this will maintain.  Paroxysmal A. fib: Rate controlled.  Not a candidate for anticoagulation, but will need blood thinning medication for PE as above.  Chronic systolic CHF: Mild decrease in EF, 45 to 50% as of 01/16/2017.  Initially slightly hypovolemic.  Now currently euvolemic.  QT prolongation: Checking daily EKG, holding Haldol  Essential tremor: Continue primidone  Type 2 diabetes mellitus: On gabapentin for neuropathy.  Oral hypoglycemics on hold, sliding scale only.  Code Status: DNR  Family Communication: Left message for daughter  Disposition Plan: Dependent on whether patient stays awake or not.   Consultants:  Vascular surgery  Psychiatry  Palliative care  Procedures:  TPA done 11/11  Antimicrobials:  None  DVT prophylaxis: IV heparin   Objective: Vitals:   11/24/18 0731 11/24/18 1218  BP: (!) 103/58 (!) 104/58  Pulse: 70 67  Resp: 18   Temp: 99.1 F (37.3 C)   SpO2: 100%     Intake/Output Summary (Last 24 hours) at 11/24/2018 1406 Last data filed at 11/24/2018 0927 Gross per 24 hour  Intake 253.15 ml  Output 400 ml  Net -146.85 ml   Filed Weights   11/22/18 0327 11/23/18 0500 11/24/18 0519  Weight: 75.9 kg 76.5 kg 76.2 kg   Body  mass index is 24.82 kg/m.  Exam:   General: Currently somnolent  HEENT: Normocephalic, atraumatic, mucous membranes are slightly dry  Neck: Supple, no JVD  Cardiovascular: Irregular rhythm, rate controlled  Respiratory:  poor inspiratory effort  Abdomen: Soft, nondistended, hypoactive bowel sounds  Musculoskeletal: No clubbing or  cyanosis, 1+ pitting edema  Neuro: Minimal resting tremor, when not awake  Psychiatry: Currently somnolent   Data Reviewed: CBC: Recent Labs  Lab 11/20/18 0609 11/21/18 0354 11/22/18 0005 11/23/18 0717 11/24/18 0155  WBC 8.1 8.6 6.7 8.7 5.7  HGB 8.8* 9.3* 8.6* 8.7* 8.1*  HCT 27.3* 28.9* 26.3* 26.1* 24.4*  MCV 105.8* 104.3* 104.8* 101.6* 104.7*  PLT 180 209 162 165 Q000111Q*   Basic Metabolic Panel: Recent Labs  Lab 11/18/18 0531 11/19/18 0453 11/21/18 0354 11/22/18 0544 11/23/18 0717  NA 149* 144 140 137 138  K 3.3* 3.1* 3.0* 2.8* 3.6  CL 120* 115* 113* 110 109  CO2 22 20* 21* 19* 20*  GLUCOSE 120* 161* 139* 128* 147*  BUN 17 13 10 8  7*  CREATININE 0.79 0.75 0.75 0.59* 0.80  CALCIUM 7.9* 7.7* 7.8* 7.6* 7.6*   GFR: Estimated Creatinine Clearance: 70 mL/min (by C-G formula based on SCr of 0.8 mg/dL). Liver Function Tests: Recent Labs  Lab 11/19/18 0453 11/21/18 0354  AST 17 18  ALT 11 11  ALKPHOS 71 82  BILITOT 1.0 1.0  PROT 5.3* 5.7*  ALBUMIN 2.2* 2.3*   No results for input(s): LIPASE, AMYLASE in the last 168 hours. No results for input(s): AMMONIA in the last 168 hours. Coagulation Profile: No results for input(s): INR, PROTIME in the last 168 hours. Cardiac Enzymes: No results for input(s): CKTOTAL, CKMB, CKMBINDEX, TROPONINI in the last 168 hours. BNP (last 3 results) No results for input(s): PROBNP in the last 8760 hours. HbA1C: No results for input(s): HGBA1C in the last 72 hours. CBG: Recent Labs  Lab 11/23/18 2109 11/24/18 0055 11/24/18 0515 11/24/18 0728 11/24/18 1200  GLUCAP 150* 123* 142* 127* 156*   Lipid Profile: No results for input(s): CHOL, HDL, LDLCALC, TRIG, CHOLHDL, LDLDIRECT in the last 72 hours. Thyroid Function Tests: No results for input(s): TSH, T4TOTAL, FREET4, T3FREE, THYROIDAB in the last 72 hours. Anemia Panel: No results for input(s): VITAMINB12, FOLATE, FERRITIN, TIBC, IRON, RETICCTPCT in the last 72 hours. Urine  analysis:    Component Value Date/Time   COLORURINE AMBER (A) 11/13/2018 1457   APPEARANCEUR CLOUDY (A) 11/13/2018 1457   APPEARANCEUR Clear 10/24/2017 1538   LABSPEC 1.030 11/13/2018 1457   PHURINE 5.0 11/13/2018 1457   GLUCOSEU NEGATIVE 11/13/2018 1457   HGBUR NEGATIVE 11/13/2018 1457   BILIRUBINUR NEGATIVE 11/13/2018 1457   BILIRUBINUR negative 02/27/2018 1636   BILIRUBINUR Negative 10/24/2017 [place] 11/13/2018 1457   PROTEINUR 30 (A) 11/13/2018 1457   UROBILINOGEN 0.2 02/27/2018 1636   NITRITE NEGATIVE 11/13/2018 1457   LEUKOCYTESUR NEGATIVE 11/13/2018 1457   Sepsis Labs: @LABRCNTIP (procalcitonin:4,lacticidven:4)  ) No results found for this or any previous visit (from the past 240 hour(s)).    Studies: No results found.  Scheduled Meds: . amLODipine  2.5 mg Oral Daily  . atorvastatin  20 mg Oral q1800  . chlorhexidine  15 mL Mouth Rinse BID  . feeding supplement (NEPRO CARB STEADY)  237 mL Oral BID BM  . gabapentin  100 mg Oral QHS  . insulin aspart  0-9 Units Subcutaneous Q4H  . mouth rinse  15 mL Mouth Rinse q12n4p  . pantoprazole  40 mg Oral Daily  . PARoxetine  30 mg Oral Daily  . primidone  100 mg Oral TID  . QUEtiapine  100 mg Oral QHS  . QUEtiapine  50 mg Oral BID  . sodium chloride flush  3 mL Intravenous Q12H  . traZODone  100 mg Oral QHS    Continuous Infusions: . dextrose 10 % 1,000 mL with potassium chloride 40 mEq infusion 50 mL/hr at 11/24/18 0732  . heparin 1,250 Units/hr (11/24/18 0141)     LOS: 11 days     Annita Brod, MD [facility]  To reach me or the doctor on call, go to: [email or web address] Password [email or web address]  11/24/2018, 2:06 PM

## 2018-11-24 NOTE — Progress Notes (Signed)
CCMD called to inform that patient is having more frequent PVC's and QTC >500 today. Also that patient is in sinus rhythm. Dr. [place] Pink notified via secure chat.

## 2018-11-24 NOTE — Progress Notes (Signed)
ANTICOAGULATION CONSULT NOTE:  Pharmacy Consult for Heparin  Indication: pulmonary embolus  Patient Measurements: Height: 5\' 9"  (175.3 cm) Weight: 168 lb 11.2 oz (76.5 kg) IBW/kg (Calculated) : 70.7  Use TBW for heparin dosing.  Vital Signs: Temp: 98.8 F (37.1 C) (11/20 2009) Temp Source: Oral (11/20 2009) BP: 110/57 (11/20 2009) Pulse Rate: 95 (11/20 2009)  Labs: Recent Labs    11/21/18 0354  11/22/18 0005 11/22/18 0544  11/23/18 0717 11/23/18 1738 11/24/18 0155  HGB 9.3*  --  8.6*  --   --  8.7*  --  8.1*  HCT 28.9*  --  26.3*  --   --  26.1*  --  24.4*  PLT 209  --  162  --   --  165  --  145*  HEPARINUNFRC <0.10*   < > 0.63  --    < > 0.27* 0.50 0.57  CREATININE 0.75  --   --  0.59*  --  0.80  --   --    < > = values in this interval not displayed.    Estimated Creatinine Clearance: 70 mL/min (by C-G formula based on SCr of 0.8 mg/dL).  Assessment: Pharmacy consulted to dose heparin in this 85 year old male admitted with PE.  No prior anticoagulation noted. H&H trending up, PLT WNL, baseline INR 1.2, aPTT 32s.  Heparin Course: 11/13 0345 HL 0.31, inc to 1000 units/hr 11/13 1356 HL 0.30, inc to 1100 units/hr 11/14 0012 HL 0.26, inc to 1250 units/hr 11/14 1131 HL 0.49, therapeutic x1 11/14 1938 HL 0.50, therapeutic x 2 11/15 0531 HL 0.50, therapeutic x 3 11/16 0453 HL 0.51, therapeutic x 4, CBC stable 11/18 1452 HL 0.26, subtherapeutic  11/19 0000 HL 0.63 therapeutic @ 1450 units/hr 11/19 0754 HL 0.83: decrease to 1300 units/hr 11/19 1620 HL 0.69  Decrease to 1200 units/hr 11/20 0717 HL 0.27 increase to 1250 units/hr 11/20 1755 HL 0.50. Therapeutic, though significant increase s/sp rate increase. Confirmed no interruptions.   Goal of Therapy:  Heparin level 0.3-0.7 units/ml Monitor platelets by anticoagulation protocol: Yes   Plan:  11/21 @ 0200 HL 0.57 therapeutic. Will continue current rate and will recheck HL w/ am labs. CBC continues to decline will  continue to monitor.  Pharmacy will continue to follow.   Tobie Lords, PharmD Clinical Pharmacist 11/24/2018 3:31 AM

## 2018-11-24 NOTE — Plan of Care (Signed)
  Problem: Safety: Goal: Ability to remain free from injury will improve Outcome: Progressing   

## 2018-11-25 LAB — GLUCOSE, CAPILLARY
Glucose-Capillary: 112 mg/dL — ABNORMAL HIGH (ref 70–99)
Glucose-Capillary: 128 mg/dL — ABNORMAL HIGH (ref 70–99)
Glucose-Capillary: 145 mg/dL — ABNORMAL HIGH (ref 70–99)
Glucose-Capillary: 163 mg/dL — ABNORMAL HIGH (ref 70–99)
Glucose-Capillary: 172 mg/dL — ABNORMAL HIGH (ref 70–99)
Glucose-Capillary: 176 mg/dL — ABNORMAL HIGH (ref 70–99)

## 2018-11-25 LAB — HEPARIN LEVEL (UNFRACTIONATED): Heparin Unfractionated: 0.32 IU/mL (ref 0.30–0.70)

## 2018-11-25 NOTE — Progress Notes (Signed)
ANTICOAGULATION CONSULT NOTE:  Pharmacy Consult for Heparin  Indication: pulmonary embolus  Patient Measurements: Height: 5\' 9"  (175.3 cm) Weight: 172 lb 2.9 oz (78.1 kg) IBW/kg (Calculated) : 70.7  Use TBW for heparin dosing.  Vital Signs: Temp: 98.7 F (37.1 C) (11/22 0423) Temp Source: Oral (11/22 0423) BP: 124/45 (11/22 0423) Pulse Rate: 92 (11/22 0423)  Labs: Recent Labs    11/23/18 0717 11/23/18 1738 11/24/18 0155 11/25/18 0618  HGB 8.7*  --  8.1*  --   HCT 26.1*  --  24.4*  --   PLT 165  --  145*  --   HEPARINUNFRC 0.27* 0.50 0.57 0.32  CREATININE 0.80  --   --   --     Estimated Creatinine Clearance: 70 mL/min (by C-G formula based on SCr of 0.8 mg/dL).  Assessment: Pharmacy consulted to dose heparin in this 85 year old male admitted with PE.  No prior anticoagulation noted. H&H trending up, PLT WNL, baseline INR 1.2, aPTT 32s.  Heparin Course: 11/13 0345 HL 0.31, inc to 1000 units/hr 11/13 1356 HL 0.30, inc to 1100 units/hr 11/14 0012 HL 0.26, inc to 1250 units/hr 11/14 1131 HL 0.49, therapeutic x1 11/14 1938 HL 0.50, therapeutic x 2 11/15 0531 HL 0.50, therapeutic x 3 11/16 0453 HL 0.51, therapeutic x 4, CBC stable 11/18 1452 HL 0.26, subtherapeutic  11/19 0000 HL 0.63 therapeutic @ 1450 units/hr 11/19 0754 HL 0.83: decrease to 1300 units/hr 11/19 1620 HL 0.69  Decrease to 1200 units/hr 11/20 0717 HL 0.27 increase to 1250 units/hr 11/20 1755 HL 0.50. Therapeutic, though significant increase s/sp rate increase. Confirmed no interruptions.  11/21 @ 0200 HL 0.57  Goal of Therapy:  Heparin level 0.3-0.7 units/ml Monitor platelets by anticoagulation protocol: Yes   Plan:  11/22 @ 06:18 HL 0.32 therapeutic. Will continue current rate and will recheck HL w/ am labs. CBC continues to decline will continue to monitor.  Pharmacy will continue to follow.   Olivia Canter [facility] Clinical Pharmacist 11/25/2018 7:57 AM

## 2018-11-25 NOTE — Progress Notes (Signed)
PROGRESS NOTE  Chase Simmons [id number] DOB: 09/23/1933 DOA: 11/13/2018 PCP: [name], MD  HPI/Recap of past 30 hours: 85 year old male with past medical history of Parkinson's disease with dementia, chronic systolic CHF, paroxysmal A. fib and COPD previous admission for Covid infection 1 month prior who was admitted on 11/10 with hypoxia, hypotension and altered mental status.  Patient was found to have bilateral acute pulmonary embolus.  He was admitted to the ICU or vascular surgery was consulted and patient went thrombolysis with bilateral pulmonary artery TPA.  Stabilized and transferred to a medical floor on 11/13.  Since then, patient's course noted delayed recovery with increased somnolence and very poor p.o. intake.  After discussion with family, an NG tube was attempted to be placed on 11/16.  This was unable to done after difficulties and struggling with insertion.  On morning of 11/20, he became more awake and started eating somewhat.  Was somnolent on 11/21, but then more awake again today.  After discussion with family, they would like to see if this improvement continues, and then plan to take patient home.     Assessment/Plan: Active Problems: Acute respiratory failure with hypoxia secondary to acute massive bilateral pulmonary embolism [facility]): Status post thrombolysis by vascular surgery.  Briefly required pressor support, now weaned off and stabilized.  Currently on IV heparin infusion.  Briefly appeared to be slowly improving and will transition to oral anticoagulation if he is still staying awake tomorrow..  If patient is unable to take p.o. consistently and family does not opt for comfort care, would need IVC filter.  COPD: Stable.    Hypotension in the setting of essential hypertension: Stable for now.  Resolved once off of pressor support.  Dementia with acute severe delirium: Briefly on Precedex on 11/11.  Psychiatry consulted.  Sedating medications held,  allowing patient be more awake.  Hopefully he will continue to remain awake.    Oropharyngeal dysphagia: In part due to his acute delirium, but also from his worsening dementia.  He seems to be a bit more alert and tolerating some p.o.  Hopefully this will maintain.  Paroxysmal A. fib: Rate controlled.  Not a candidate for anticoagulation, but will need blood thinning medication for PE as above.  Chronic systolic CHF: Mild decrease in EF, 45 to 50% as of 01/16/2017.  Initially slightly hypovolemic.  Now currently euvolemic.  QT prolongation: Checking daily EKG.  So far, normal.  Essential tremor: Continue primidone  Type 2 diabetes mellitus: On gabapentin for neuropathy.  Oral hypoglycemics on hold, sliding scale only.  Code Status: DNR  Family Communication: Updated daughter at the bedside  Disposition Plan: Dependent on whether patient stays awake or not.   Consultants:  Vascular surgery  Psychiatry  Palliative care  Procedures:  TPA done 11/11  Antimicrobials:  None  DVT prophylaxis: IV heparin   Objective: Vitals:   11/25/18 0423 11/25/18 0830  BP: (!) 124/45 115/89  Pulse: 92 99  Resp: 20 (!) 21  Temp: 98.7 F (37.1 C) 98.4 F (36.9 C)  SpO2: 99% 100%    Intake/Output Summary (Last 24 hours) at 11/25/2018 1315 Last data filed at 11/25/2018 [id number] Gross per 24 hour  Intake 1416.46 ml  Output 475 ml  Net 941.46 ml   Filed Weights   11/23/18 0500 11/24/18 0519 11/25/18 0556  Weight: 76.5 kg 76.2 kg 78.1 kg   Body mass index is 25.43 kg/m.  Exam:   General: Awake, underlying dementia, appears oriented x1  HEENT: Normocephalic, atraumatic, mucous membranes are slightly dry  Neck: Supple, no JVD  Cardiovascular: Irregular rhythm, rate controlled  Respiratory:  poor inspiratory effort  Abdomen: Soft, nondistended, hypoactive bowel sounds  Musculoskeletal: No clubbing or cyanosis, 1+ pitting edema  Neuro: Minimal intention tremor   Psychiatry: Chronic dementia, no evidence of acute psychosis   Data Reviewed: CBC: Recent Labs  Lab 11/20/18 0609 11/21/18 0354 11/22/18 0005 11/23/18 0717 11/24/18 0155  WBC 8.1 8.6 6.7 8.7 5.7  HGB 8.8* 9.3* 8.6* 8.7* 8.1*  HCT 27.3* 28.9* 26.3* 26.1* 24.4*  MCV 105.8* 104.3* 104.8* 101.6* 104.7*  PLT 180 209 162 165 Q000111Q*   Basic Metabolic Panel: Recent Labs  Lab 11/19/18 0453 11/21/18 0354 11/22/18 0544 11/23/18 0717  NA 144 140 137 138  K 3.1* 3.0* 2.8* 3.6  CL 115* 113* 110 109  CO2 20* 21* 19* 20*  GLUCOSE 161* 139* 128* 147*  BUN 13 10 8  7*  CREATININE 0.75 0.75 0.59* 0.80  CALCIUM 7.7* 7.8* 7.6* 7.6*   GFR: Estimated Creatinine Clearance: 70 mL/min (by C-G formula based on SCr of 0.8 mg/dL). Liver Function Tests: Recent Labs  Lab 11/19/18 0453 11/21/18 0354  AST 17 18  ALT 11 11  ALKPHOS 71 82  BILITOT 1.0 1.0  PROT 5.3* 5.7*  ALBUMIN 2.2* 2.3*   No results for input(s): LIPASE, AMYLASE in the last 168 hours. No results for input(s): AMMONIA in the last 168 hours. Coagulation Profile: No results for input(s): INR, PROTIME in the last 168 hours. Cardiac Enzymes: No results for input(s): CKTOTAL, CKMB, CKMBINDEX, TROPONINI in the last 168 hours. BNP (last 3 results) No results for input(s): PROBNP in the last 8760 hours. HbA1C: No results for input(s): HGBA1C in the last 72 hours. CBG: Recent Labs  Lab 11/24/18 2052 11/25/18 0000 11/25/18 0421 11/25/18 0801 11/25/18 1152  GLUCAP 127* 128* 163* 112* 172*   Lipid Profile: No results for input(s): CHOL, HDL, LDLCALC, TRIG, CHOLHDL, LDLDIRECT in the last 72 hours. Thyroid Function Tests: No results for input(s): TSH, T4TOTAL, FREET4, T3FREE, THYROIDAB in the last 72 hours. Anemia Panel: No results for input(s): VITAMINB12, FOLATE, FERRITIN, TIBC, IRON, RETICCTPCT in the last 72 hours. Urine analysis:    Component Value Date/Time   COLORURINE AMBER (A) 11/13/2018 1457   APPEARANCEUR  CLOUDY (A) 11/13/2018 1457   APPEARANCEUR Clear 10/24/2017 1538   LABSPEC 1.030 11/13/2018 1457   PHURINE 5.0 11/13/2018 1457   GLUCOSEU NEGATIVE 11/13/2018 1457   HGBUR NEGATIVE 11/13/2018 1457   BILIRUBINUR NEGATIVE 11/13/2018 1457   BILIRUBINUR negative 02/27/2018 1636   BILIRUBINUR Negative 10/24/2017 [place] 11/13/2018 1457   PROTEINUR 30 (A) 11/13/2018 1457   UROBILINOGEN 0.2 02/27/2018 1636   NITRITE NEGATIVE 11/13/2018 1457   LEUKOCYTESUR NEGATIVE 11/13/2018 1457   Sepsis Labs: @LABRCNTIP (procalcitonin:4,lacticidven:4)  ) No results found for this or any previous visit (from the past 240 hour(s)).    Studies: No results found.  Scheduled Meds: . amLODipine  2.5 mg Oral Daily  . atorvastatin  20 mg Oral q1800  . chlorhexidine  15 mL Mouth Rinse BID  . feeding supplement (NEPRO CARB STEADY)  237 mL Oral BID BM  . gabapentin  100 mg Oral QHS  . insulin aspart  0-9 Units Subcutaneous Q4H  . mouth rinse  15 mL Mouth Rinse q12n4p  . pantoprazole  40 mg Oral Daily  . PARoxetine  30 mg Oral Daily  . primidone  100 mg Oral TID  .  QUEtiapine  100 mg Oral QHS  . QUEtiapine  50 mg Oral BID  . sodium chloride flush  3 mL Intravenous Q12H  . traZODone  100 mg Oral QHS    Continuous Infusions: . dextrose 10 % 1,000 mL with potassium chloride 40 mEq infusion 50 mL/hr at 11/25/18 0357  . heparin 1,250 Units/hr (11/24/18 2316)     LOS: 12 days     Annita Brod, MD [facility]  To reach me or the doctor on call, go to: [email or web address] Password [email or web address]  11/25/2018, 1:15 PM

## 2018-11-25 NOTE — Plan of Care (Signed)
  Problem: Education: Goal: Knowledge of General Education information will improve Description: Including pain rating scale, medication(s)/side effects and non-pharmacologic comfort measures Outcome: Not Progressing Note: Patient with dementia and mostly non-verbal. Total assist with feeds. May eventually need an IVC filter placed. Will continue to monitor neurological status for the remainder of the shift. Wenda Low [facility]

## 2018-11-25 NOTE — Plan of Care (Signed)
  Problem: Clinical Measurements: Goal: Cardiovascular complication will be avoided Outcome: Progressing   Problem: Safety: Goal: Ability to remain free from injury will improve Outcome: Progressing   

## 2018-11-26 LAB — BASIC METABOLIC PANEL
Anion gap: 7 (ref 5–15)
BUN: 14 mg/dL (ref 8–23)
CO2: 19 mmol/L — ABNORMAL LOW (ref 22–32)
Calcium: 7.4 mg/dL — ABNORMAL LOW (ref 8.9–10.3)
Chloride: 108 mmol/L (ref 98–111)
Creatinine, Ser: 0.79 mg/dL (ref 0.61–1.24)
GFR calc Af Amer: >60 mL/min (ref >60)
GFR calc non Af Amer: >60 mL/min (ref >60)
Glucose, Bld: 127 mg/dL — ABNORMAL HIGH (ref 70–99)
Potassium: 4 mmol/L (ref 3.5–5.1)
Sodium: 134 mmol/L — ABNORMAL LOW (ref 135–145)

## 2018-11-26 LAB — GLUCOSE, CAPILLARY
Glucose-Capillary: 183 mg/dL — ABNORMAL HIGH (ref 70–99)
Glucose-Capillary: 140 mg/dL — ABNORMAL HIGH (ref 70–99)
Glucose-Capillary: 122 mg/dL — ABNORMAL HIGH (ref 70–99)
Glucose-Capillary: 102 mg/dL — ABNORMAL HIGH (ref 70–99)
Glucose-Capillary: 162 mg/dL — ABNORMAL HIGH (ref 70–99)
Glucose-Capillary: 172 mg/dL — ABNORMAL HIGH (ref 70–99)
Glucose-Capillary: 131 mg/dL — ABNORMAL HIGH (ref 70–99)

## 2018-11-26 LAB — CBC WITH DIFFERENTIAL/PLATELET
Abs Immature Granulocytes: 0.03 10*3/uL (ref 0.00–0.07)
Basophils Absolute: 0 10*3/uL (ref 0.0–0.1)
Basophils Relative: 1 %
Eosinophils Absolute: 0 10*3/uL (ref 0.0–0.5)
Eosinophils Relative: 1 %
HCT: 22.8 % — ABNORMAL LOW (ref 39.0–52.0)
Hemoglobin: 7.6 g/dL — ABNORMAL LOW (ref 13.0–17.0)
Immature Granulocytes: 1 %
Lymphocytes Relative: 22 %
Lymphs Abs: 1.3 10*3/uL (ref 0.7–4.0)
MCH: 34.2 pg — ABNORMAL HIGH (ref 26.0–34.0)
MCHC: 33.3 g/dL (ref 30.0–36.0)
MCV: 102.7 fL — ABNORMAL HIGH (ref 80.0–100.0)
Monocytes Absolute: 0.8 10*3/uL (ref 0.1–1.0)
Monocytes Relative: 13 %
Neutro Abs: 3.9 10*3/uL (ref 1.7–7.7)
Neutrophils Relative %: 62 %
Platelets: 123 10*3/uL — ABNORMAL LOW (ref 150–400)
RBC: 2.22 MIL/uL — ABNORMAL LOW (ref 4.22–5.81)
RDW: 17.2 % — ABNORMAL HIGH (ref 11.5–15.5)
WBC: 6.1 10*3/uL (ref 4.0–10.5)
nRBC: 0 % (ref 0.0–0.2)

## 2018-11-26 LAB — HEPARIN LEVEL (UNFRACTIONATED): Heparin Unfractionated: 0.41 IU/mL (ref 0.30–0.70)

## 2018-11-26 MED ORDER — RIVAROXABAN 20 MG PO TABS: 20.0000 mg | ORAL_TABLET | Freq: Every day | ORAL | Status: DC

## 2018-11-26 MED ORDER — RIVAROXABAN 15 MG PO TABS
15.0000 mg | ORAL_TABLET | Freq: Two times a day (BID) | ORAL | Status: DC
  Filled 2018-11-26 (×2): qty 1

## 2018-11-26 MED ORDER — DEXTROSE 10 % IV SOLN
INTRAVENOUS | Status: DC
  Administered 2018-11-26: 11:00:00 via INTRAVENOUS

## 2018-11-26 MED ORDER — HEPARIN (PORCINE) 25000 UT/250ML-% IV SOLN
1300.0000 [IU]/h | INTRAVENOUS | Status: DC
  Administered 2018-11-26 – 2018-11-28 (×3): 1250 [IU]/h via INTRAVENOUS
  Administered 2018-11-29: 1300 [IU]/h via INTRAVENOUS
  Filled 2018-11-26 (×4): qty 250

## 2018-11-26 NOTE — Progress Notes (Signed)
ANTICOAGULATION CONSULT NOTE:  Pharmacy Consult for Heparin  Indication: pulmonary embolus  Patient Measurements: Height: 5\' 9"  (175.3 cm) Weight: 170 lb 4.8 oz (77.2 kg) IBW/kg (Calculated) : 70.7  Use TBW for heparin dosing.  Vital Signs: Temp: 99.3 F (37.4 C) (11/23 0509) Temp Source: Oral (11/23 0509) BP: 108/68 (11/23 0509) Pulse Rate: 64 (11/23 0509)  Labs: Recent Labs    11/23/18 0717  11/24/18 0155 11/25/18 0618 11/26/18 0559  HGB 8.7*  --  8.1*  --  7.6*  HCT 26.1*  --  24.4*  --  22.8*  PLT 165  --  145*  --  123*  HEPARINUNFRC 0.27*   < > 0.57 0.32 0.41  CREATININE 0.80  --   --   --  0.79   < > = values in this interval not displayed.    Estimated Creatinine Clearance: 70 mL/min (by C-G formula based on SCr of 0.79 mg/dL).  Assessment: Pharmacy consulted to dose heparin in this 85 year old male admitted with PE.  No prior anticoagulation noted. H&H trending up, PLT WNL, baseline INR 1.2, aPTT 32s.  Heparin Course: 11/13 0345 HL 0.31, inc to 1000 units/hr 11/13 1356 HL 0.30, inc to 1100 units/hr 11/14 0012 HL 0.26, inc to 1250 units/hr 11/14 1131 HL 0.49, therapeutic x1 11/14 1938 HL 0.50, therapeutic x 2 11/15 0531 HL 0.50, therapeutic x 3 11/16 0453 HL 0.51, therapeutic x 4, CBC stable 11/18 1452 HL 0.26, subtherapeutic  11/19 0000 HL 0.63 therapeutic @ 1450 units/hr 11/19 0754 HL 0.83: decrease to 1300 units/hr 11/19 1620 HL 0.69  Decrease to 1200 units/hr 11/20 0717 HL 0.27 increase to 1250 units/hr 11/20 1755 HL 0.50. Therapeutic, though significant increase s/sp rate increase. Confirmed no interruptions.  11/21 @ 0200 HL 0.57  Goal of Therapy:  Heparin level 0.3-0.7 units/ml Monitor platelets by anticoagulation protocol: Yes   Plan:  11/23 0600 @  HL 0.41 therapeutic. Will continue current rate and will recheck HL w/ am labs. CBC continues to decline will continue to monitor.  Pharmacy will continue to follow.   Tobie Lords,  [facility] Clinical Pharmacist 11/26/2018 6:58 AM

## 2018-11-26 NOTE — Progress Notes (Signed)
Speech Therapy Note: reviewed chart notes; consulted Palliative Care re: pt's status, diet at this time. NP shared that pt is only taking minimal bites of ice cream from lunch tray. He continues to have poor appetite despite being more awake at times. His intake amounts are not enough to sustain life over a period of time, and he in inconsistent with intake. NP reported that during her session w/ him today, he would not awaken much and he was inconsistent w/ engagement -- not answering questions but gave his name.  Recommend continue w/ current dysphagia diet for potential safety w/ oral intake. Recommend continued f/u w/ Palliative Care services. ST services can be reconsult if any further needs. Recommend frequent oral care; aspiration precautions.    Orinda Kenner, [place], CCC-SLP

## 2018-11-26 NOTE — Progress Notes (Signed)
Nutrition Follow Up Note   DOCUMENTATION CODES:   Not applicable  INTERVENTION:   Nepro Shake po BID, each supplement provides 425 kcal and 19 grams protein  Magic cup TID with meals, each supplement provides 290 kcal and 9 grams of protein  NUTRITION DIAGNOSIS:   Increased nutrient needs related to catabolic illness(COPD) as evidenced by increased estimated needs.  GOAL:   Patient will meet greater than or equal to 90% of their needs-not met   MONITOR:   PO intake, Supplement acceptance, Labs, Weight trends, Skin, I & O's  ASSESSMENT:   85 y.o. male  with past medical history of dementia, chronic systolic heart failure, paroxysmal atrial fibrillation (not on anticoagulation), HTN, COPD, type 2 DM, recent covid admission at [facility] 10/15-10/21 admitted on 11/13/2018 with AMS and hypoxia. CT angiogram of chest showed bilateral acute PE with significant clot burden.   Pt continues to have poor appetite and oral intake; pt eating only sips and bites of meals. Palliative care following for GOC. Family does not wish to have feeding tube placement. Supplements added to help pt meet his estimated needs. Per chart, pt is weight stable since admit.   Medications reviewed and include: insulin, protonix, paxil, 10% dextrose '@50ml' /hr, heparin  Labs reviewed: Na 134(L) Hgb 7.6(L), Hct 22.8(L), MCV 102.7(H), MCH 34.2(H) cbgs- 140, 122, 102, 162 x 24 hrs AIC 6.0(H)- 10/17  Diet Order:   Diet Order            DIET - DYS 1 Room service appropriate? Yes with Assist; Fluid consistency: Nectar Thick  Diet effective now             EDUCATION NEEDS:   No education needs have been identified at this time  Skin:  Skin Assessment: Reviewed RN Assessment(stage1;mid;sacrum)  Last BM:  11/23- type 7  Height:   Ht Readings from Last 1 Encounters:  11/13/18 '5\' 9"'  (1.753 m)    Weight:   Wt Readings from Last 1 Encounters:  11/26/18 77.2 kg    Ideal Body Weight:  72.7 kg  BMI:  Body  mass index is 25.15 kg/m.  Estimated Nutritional Needs:   Kcal:  1900-2100  Protein:  100-115  Fluid:  >/= 1.9 L/day  Koleen Distance MS, RD, LDN Pager #- [phone] Office#- [phone] After Hours Pager: [phone]

## 2018-11-26 NOTE — Progress Notes (Signed)
PROGRESS NOTE  Chase Simmons [id number] DOB: [birth year] DOA: 11/13/2018 PCP: [name], MD  HPI/Recap of past 8 hours: 85 year old male with past medical history of Parkinson's disease with dementia, chronic systolic CHF, paroxysmal A. fib and COPD previous admission for Covid infection 1 month prior who was admitted on 11/10 with hypoxia, hypotension and altered mental status.  Patient was found to have bilateral acute pulmonary embolus.  He was admitted to the ICU or vascular surgery was consulted and patient went thrombolysis with bilateral pulmonary artery TPA.  Stabilized and transferred to a medical floor on 11/13.  Since then, patient's course noted delayed recovery with increased somnolence and very poor p.o. intake.  After discussion with family, an NG tube was attempted to be placed on 11/16.  This was unable to done after difficulties and struggling with insertion.  On morning of 11/20, he became more awake and started eating somewhat.  Since then however, he has alternated days of somnolence with days of being somewhat awake.  Today, he is somnolent.  He is unable to stay awake enough to get oral anticoagulation and is staying on heparin.    Family was hoping that if you improved, he would get to go home.  They are looking at alternative options including home hospice  Assessment/Plan: Active Problems: Acute respiratory failure with hypoxia secondary to acute massive bilateral pulmonary embolism [facility]): Status post thrombolysis by vascular surgery.  Briefly required pressor support, now weaned off and stabilized.  Currently on IV heparin infusion.  Briefly appeared to be slowly improving and will transition to oral anticoagulation if he is still staying awake tomorrow..  If patient is unable to take p.o. consistently and family does not opt for comfort care, would need IVC filter.  COPD: Stable.    Hypotension in the setting of essential hypertension: Stable for now.   Resolved once off of pressor support.  Dementia with acute severe delirium: Briefly on Precedex on 11/11.  Psychiatry consulted.  Sedating medications held, allowing patient be more awake.  Hopefully he will continue to remain awake.    Oropharyngeal dysphagia: In part due to his acute delirium, but also from his worsening dementia.  He seems to be a bit more alert and tolerating some p.o.  Hopefully this will maintain.  Paroxysmal A. fib: Rate controlled.  Not a candidate for anticoagulation, but will need blood thinning medication for PE as above.  Chronic systolic CHF: Mild decrease in EF, 45 to 50% as of 01/16/2017.  Initially slightly hypovolemic.  Now currently euvolemic.  QT prolongation: Checking daily EKG.  So far, normal.  Essential tremor: Continue primidone  Type 2 diabetes mellitus: On gabapentin for neuropathy.  Oral hypoglycemics on hold, sliding scale only.  Code Status: DNR  Family Communication: Updated daughter at the bedside  Disposition Plan: Family understanding that he will have good and bad days which will long-term in the length and his expected mortality to a month or 2.  Willing to look at options such as home with hospice.   Consultants:  Vascular surgery  Psychiatry  Palliative care  Procedures:  TPA done 11/11  Antimicrobials:  None  DVT prophylaxis: IV heparin   Objective: Vitals:   11/26/18 0509 11/26/18 0740  BP: 108/68 112/72  Pulse: 64 66  Resp:    Temp: 99.3 F (37.4 C) 99 F (37.2 C)  SpO2: 100% 100%    Intake/Output Summary (Last 24 hours) at 11/26/2018 1349 Last data filed at 11/26/2018 [id number] Gross  per 24 hour  Intake -  Output 500 ml  Net -500 ml   Filed Weights   11/24/18 0519 11/25/18 0556 11/26/18 0509  Weight: 76.2 kg 78.1 kg 77.2 kg   Body mass index is 25.15 kg/m.  Exam:   General: Awake, underlying dementia, currently somnolent  HEENT: Normocephalic, atraumatic, mucous membranes are slightly dry   Neck: Supple, no JVD  Cardiovascular: Irregular rhythm, rate controlled  Respiratory:  poor inspiratory effort  Abdomen: Soft, nondistended, hypoactive bowel sounds  Musculoskeletal: No clubbing or cyanosis, 1+ pitting edema  Neuro: Minimal intention tremor when awake  Psychiatry: Chronic dementia, no evidence of acute psychosis, currently somnolent   Data Reviewed: CBC: Recent Labs  Lab 11/21/18 0354 11/22/18 0005 11/23/18 0717 11/24/18 0155 11/26/18 0559  WBC 8.6 6.7 8.7 5.7 6.1  NEUTROABS  --   --   --   --  3.9  HGB 9.3* 8.6* 8.7* 8.1* 7.6*  HCT 28.9* 26.3* 26.1* 24.4* 22.8*  MCV 104.3* 104.8* 101.6* 104.7* 102.7*  PLT 209 162 165 145* [id number]*   Basic Metabolic Panel: Recent Labs  Lab 11/21/18 0354 11/22/18 0544 11/23/18 0717 11/26/18 0559  NA 140 137 138 134*  K 3.0* 2.8* 3.6 4.0  CL 113* 110 109 108  CO2 21* 19* 20* 19*  GLUCOSE 139* 128* 147* 127*  BUN 10 8 7* 14  CREATININE 0.75 0.59* 0.80 0.79  CALCIUM 7.8* 7.6* 7.6* 7.4*   GFR: Estimated Creatinine Clearance: 70 mL/min (by C-G formula based on SCr of 0.79 mg/dL). Liver Function Tests: Recent Labs  Lab 11/21/18 0354  AST 18  ALT 11  ALKPHOS 82  BILITOT 1.0  PROT 5.7*  ALBUMIN 2.3*   No results for input(s): LIPASE, AMYLASE in the last 168 hours. No results for input(s): AMMONIA in the last 168 hours. Coagulation Profile: No results for input(s): INR, PROTIME in the last 168 hours. Cardiac Enzymes: No results for input(s): CKTOTAL, CKMB, CKMBINDEX, TROPONINI in the last 168 hours. BNP (last 3 results) No results for input(s): PROBNP in the last 8760 hours. HbA1C: No results for input(s): HGBA1C in the last 72 hours. CBG: Recent Labs  Lab 11/25/18 2204 11/26/18 0111 11/26/18 0433 11/26/18 0801 11/26/18 1139  GLUCAP 176* 140* 122* 102* 162*   Lipid Profile: No results for input(s): CHOL, HDL, LDLCALC, TRIG, CHOLHDL, LDLDIRECT in the last 72 hours. Thyroid Function Tests: No results  for input(s): TSH, T4TOTAL, FREET4, T3FREE, THYROIDAB in the last 72 hours. Anemia Panel: No results for input(s): VITAMINB12, FOLATE, FERRITIN, TIBC, IRON, RETICCTPCT in the last 72 hours. Urine analysis:    Component Value Date/Time   COLORURINE AMBER (A) 11/13/2018 1457   APPEARANCEUR CLOUDY (A) 11/13/2018 1457   APPEARANCEUR Clear 10/24/2017 1538   LABSPEC 1.030 11/13/2018 1457   PHURINE 5.0 11/13/2018 1457   GLUCOSEU NEGATIVE 11/13/2018 1457   HGBUR NEGATIVE 11/13/2018 1457   BILIRUBINUR NEGATIVE 11/13/2018 1457   BILIRUBINUR negative 02/27/2018 1636   BILIRUBINUR Negative 10/24/2017 [place] 11/13/2018 1457   PROTEINUR 30 (A) 11/13/2018 1457   UROBILINOGEN 0.2 02/27/2018 1636   NITRITE NEGATIVE 11/13/2018 1457   LEUKOCYTESUR NEGATIVE 11/13/2018 1457   Sepsis Labs: @LABRCNTIP (procalcitonin:4,lacticidven:4)  ) No results found for this or any previous visit (from the past 240 hour(s)).    Studies: No results found.  Scheduled Meds: . amLODipine  2.5 mg Oral Daily  . atorvastatin  20 mg Oral q1800  . chlorhexidine  15 mL Mouth Rinse BID  .  feeding supplement (NEPRO CARB STEADY)  237 mL Oral BID BM  . gabapentin  100 mg Oral QHS  . insulin aspart  0-9 Units Subcutaneous Q4H  . mouth rinse  15 mL Mouth Rinse q12n4p  . pantoprazole  40 mg Oral Daily  . PARoxetine  30 mg Oral Daily  . primidone  100 mg Oral TID  . QUEtiapine  100 mg Oral QHS  . QUEtiapine  50 mg Oral BID  . sodium chloride flush  3 mL Intravenous Q12H  . traZODone  100 mg Oral QHS    Continuous Infusions: . dextrose 50 mL/hr at 11/26/18 1105  . heparin 1,250 Units/hr (11/26/18 1143)     LOS: 13 days     Annita Brod, MD [facility]  To reach me or the doctor on call, go to: [email or web address] Password [email or web address]  11/26/2018, 1:49 PM

## 2018-11-26 NOTE — Progress Notes (Signed)
Daily Progress Note   Patient Name: Chase Simmons       Date: 11/26/2018 DOB: 07/03/1933  Age: 85 y.o. MRN#: [id number] Attending Physician: Annita Brod, MD Primary Care Physician: [name], MD Admit Date: 11/13/2018  Reason for Consultation/Follow-up: Establishing goals of care  Subjective: Chart Reviewed and updates received from RN. Patient lying in bed. He is sleeping but will arouse after multiple attempts with verbal stimuli. He is unable to appropriately answer orientation questions initially. He later randomly states his name but unable to answer other questions appropriately. He exhibits grunting and moaning on assessment. However, no response when asked if he was experiencing pain. Daughter, [name] at the bedside who felt he may be trying to have a bowel movement. He ate a few bites of ice cream from lunch tray. Continues to have poor appetite despite being more awake, alert, and taking in some po intake. His intake amounts are not enough to sustain life over a period of time and he in inconsistent with intake. He would not awaken much for breakfast and refused when he was somewhat more awake per daughter.   I discussed with daughter at length patient's prognosis. She verbalized understanding and awareness of her father's poor prognosis. [name] reports both her and her brother [name] are understanding and feel they do not want their father to suffer and to be kept comfortable in the last days that he has left. She shares their concerns of him returning to [facility] facility and possibly passing away there alone and also returning to the hospital. [name] reports they do not want him to continue with the back and forth to the hospital and all of the aggressive medical interventions  knowing if he could express himself he would not want that. [name] is tearful expressing he would want to be kept comfortable especially given his noticeable decline over the past 1-2 months.   [name] shares her father has loss approximately 60lbs over the past 6-9 months, decreased appetite, worsening mental state, and more deconditioned. Support given. [name] inquired about all available options. We discussed in detail patient returning to [facility] facility, home, and the hospice home. [name] verbalized understanding and expressed family would most likely be unable to provide the necessary care needs at home after being at the bedside and recognizing how weak and  the amount of assistance required to care for him. Family planing to further discuss returning to [facility] with hospice support vs.  Residential hospice home. [name] feels family would lean more to hospice care at the hospice home but again wishes to continue discussing with family. We discussed hospice's goals, philosophy, and admission criteria.   [name] is tearful sharing her sister [name] continues to feel her father will get better. [name] expressed she and her brother does not want to override [name] and are hopeful that when she comes to visit tomorrow she will have a different view and agree with allowing their father to be comfortable. [name] expresses she wants this to be a peaceful time for her father and for family. Therapeutic listening and support given.   Family is requesting continued watchful waiting and further family discussion. All questions answered.         Length of Stay: 13  Current Medications: Scheduled Meds:   amLODipine  2.5 mg Oral Daily   atorvastatin  20 mg Oral q1800   chlorhexidine  15 mL Mouth Rinse BID   feeding supplement (NEPRO CARB STEADY)  237 mL Oral BID BM   gabapentin  100 mg Oral QHS   insulin aspart  0-9 Units Subcutaneous Q4H   mouth rinse  15 mL Mouth Rinse q12n4p   pantoprazole  40 mg Oral Daily     PARoxetine  30 mg Oral Daily   primidone  100 mg Oral TID   QUEtiapine  100 mg Oral QHS   QUEtiapine  50 mg Oral BID   sodium chloride flush  3 mL Intravenous Q12H   traZODone  100 mg Oral QHS    Continuous Infusions:  dextrose 50 mL/hr at 11/26/18 1105   heparin 1,250 Units/hr (11/26/18 1143)    PRN Meds: polyethylene glycol, sodium chloride  Physical Exam   -somnolent, underlying dementia, chronically-ill appearing -IRR controlled rate -diminished bilaterally, poor effort -somnolent, alert to name, mumbling at times with incomprehensible words         Vital Signs: BP 112/72 (BP Location: Left Arm)    Pulse 66    Temp 99 F (37.2 C) (Oral)    Resp (!) 21    Ht 5\' 9"  (1.753 m)    Wt 77.2 kg    SpO2 100%    BMI 25.15 kg/m  SpO2: SpO2: 100 % O2 Device: O2 Device: Room Air O2 Flow Rate: O2 Flow Rate (L/min): 2 L/min  Intake/output summary:   Intake/Output Summary (Last 24 hours) at 11/26/2018 1245 Last data filed at 11/26/2018 0539 Gross per 24 hour  Intake --  Output 500 ml  Net -500 ml   LBM: Last BM Date: 11/26/18 Baseline Weight: Weight: 86.2 kg Most recent weight: Weight: 77.2 kg       Palliative Assessment/Data: PPS 20%   Flowsheet Rows     Most Recent Value  Intake Tab  Referral Department  Critical care  Unit at Time of Referral  ICU  Palliative Care Primary Diagnosis  Cardiac  Date Notified  11/14/18  Palliative Care Type  New Palliative care  Reason for referral  Clarify Goals of Care  Date of Admission  11/13/18  Date first seen by Palliative Care  11/14/18  # of days Palliative referral response time  0 Day(s)  # of days IP prior to Palliative referral  1  Clinical Assessment  Psychosocial & Spiritual Assessment  Palliative Care Outcomes      Patient Active Problem List  Diagnosis Date Noted   Oropharyngeal dysphagia    Advanced care planning/counseling discussion    Pressure injury of skin 11/14/2018   Palliative care by  specialist    Hypotension    Agitation    Dementia with behavioral disturbance ([place])    Acute respiratory failure with hypoxia ([place]) 11/13/2018   Acute massive pulmonary embolism ([place]) 11/13/2018   COVID-19 10/19/2018   COVID-19 virus infection 10/18/2018   Weakness 06/07/2018   Skin tear of elbow without complication [id number]   Lactic acidosis 06/06/2018   Depression 04/20/2018   Altered mental status    Frequency of urination 03/01/2018   Confusion 03/01/2018   Bilateral leg edema 11/08/2016   Vertigo 08/25/2016   Nausea [id number]   Chronic systolic CHF (congestive heart failure) ([place]) 08/01/2016   Atypical chest pain 07/20/2016   COPD (chronic obstructive pulmonary disease) ([place]) 03/15/2016   Coronary atherosclerosis of native coronary artery 01/05/2016   Aortic atherosclerosis ([place]) 01/05/2016   Bradycardia 01/05/2016   Anxiety 06/17/2015   Malignant neoplasm of urinary bladder ([place]) 06/17/2015   Allergic rhinitis 05/14/2015   Knee pain, bilateral 03/16/2015   PAF (paroxysmal atrial fibrillation) ([place]) 08/30/2013   Acid reflux 08/30/2013   Goals of care, counseling/discussion 03/04/2013   Tremor, essential 10/22/2012   Hyperlipidemia 10/22/2012   HTN (hypertension) 08/01/2012   Type 2 diabetes mellitus with complication, without long-term current use of insulin ([place]) 08/01/2012   Benign prostatic hyperplasia with urinary obstruction 09/02/2011    Palliative Care Assessment & Plan   Patient Profile: 85 y.o.malewith past medical history of dementia, chronic systolic heart failure, paroxysmal atrial fibrillation (not on anticoagulation), HTN, COPD, type 2 DM, recent [facility] admission at [facility] 10/15-10/21admitted on 11/10/2020with AMS and hypoxia.CT angiogram of chest showed bilateral acute PE with significant clot burden. Vascular surgery consulted and patient underwent thrombolysis in bilateral pulmonary artery with TPA. Hypotensive  on levophed. Palliative medicine consultation for goals of care.   Assessment/Recommendations/Plan:  Continue with current plan of care per medical team  Children requesting time to "watchfully wait" and make sure all are on board with plans and they can come to a mutual agreement. [name] & [name] requesting to allow patient to be comfortable and at peace during his last days, however [name] continues to differ. Family states they may be interested in sitting down together again and finalizing GOC/plan. They will let me know if this is their request.   PMT will continue to support and follow.   Goals of Care and Additional Recommendations:  Limitations on Scope of Treatment: Full Scope Treatment, DNR/DNI, No PEG  Code Status:    Code Status Orders  (From admission, onward)         Start     Ordered   11/15/18 1248  Do not attempt resuscitation (DNR)  Continuous    Question Answer Comment  In the event of cardiac or respiratory ARREST Do not call a code blue   In the event of cardiac or respiratory ARREST Do not perform Intubation, CPR, defibrillation or ACLS   In the event of cardiac or respiratory ARREST Use medication by any route, position, wound care, and other measures to relive pain and suffering. May use oxygen, suction and manual treatment of airway obstruction as needed for comfort.      11/15/18 1247        Code Status History    Date Active Date Inactive Code Status Order ID Comments User Context   11/13/2018 2001 11/15/2018 1247 Full  Code [id number]  Orene Desanctis, DO ED   10/20/2018 0606 10/24/2018 1353 DNR [id number]  Phillips Grout, MD Inpatient   10/19/2018 0319 10/20/2018 0551 DNR [id number]  Lance Coon, MD ED   06/06/2018 2015 06/13/2018 1644 DNR [id number]  Hillary Bow, MD ED   08/25/2016 0304 08/26/2016 2304 Full Code [id number]  Harrie Foreman, MD Inpatient   07/21/2016 0049 07/25/2016 2010 Full Code [id number]  Hugel[place]yer, [place], DO Inpatient   07/10/2016  0934 07/13/2016 1719 Full Code [id number]  Saundra Shelling, MD Inpatient   06/19/2015 0021 06/23/2015 0047 Full Code [id number]  Alesia Richards, MD ED   06/04/2015 2013 06/08/2015 1833 Full Code [id number]  Loletha Grayer, MD ED   Advance Care Planning Activity    Advance Directive Documentation     Most Recent Value  Type of Advance Directive  Out of facility DNR (pink MOST or yellow form)  Pre-existing out of facility DNR order (yellow form or pink MOST form)  --  "MOST" Form in Place?  --       Prognosis:   Weeks   Discharge Planning:  To Be Determined  Care plan was discussed with patient's daughter, [name] and bedside RN.   Thank you for allowing the Palliative Medicine Team to assist in the care of this patient.  Total Time: 45 min.   Greater than 50%  of this time was spent counseling and coordinating care related to the above assessment and plan.  Alda Lea, AGPCNP-BC Palliative Medicine Team  Phone: [phone] Pager: [phone] Amion: Bjorn Pippin   Please contact Palliative Medicine Team phone at [phone] for questions and concerns.

## 2018-11-26 NOTE — Consult Note (Addendum)
ANTICOAGULATION CONSULT NOTE - Initial Consult  Pharmacy Consult for Rivaroxaban  Indication: pulmonary embolus  Allergies  Allergen Reactions  . Latex Itching    Patient Measurements: Height: 5\' 9"  (175.3 cm) Weight: 170 lb 4.8 oz (77.2 kg) IBW/kg (Calculated) : 70.7   Vital Signs: Temp: 99 F (37.2 C) (11/23 0740) Temp Source: Oral (11/23 0740) BP: 112/72 (11/23 0740) Pulse Rate: 66 (11/23 0740)  Labs: Recent Labs    11/24/18 0155 11/25/18 0618 11/26/18 0559  HGB 8.1*  --  7.6*  HCT 24.4*  --  22.8*  PLT 145*  --  123*  HEPARINUNFRC 0.57 0.32 0.41  CREATININE  --   --  0.79    Estimated Creatinine Clearance: 70 mL/min (by C-G formula based on SCr of 0.79 mg/dL).   Medical History: Past Medical History:  Diagnosis Date  . Allergy   . Bladder cancer ([place])    a. followed by Dr. Jacqlyn Larsen  . Chronic combined systolic (congestive) and diastolic (congestive) heart failure ([place])    a. 07/2016 Echo: EF 30-35%, sev mid-apicalanteroseptal, ant, inf HK, Gr1 DD.  Marland Kitchen COPD (chronic obstructive pulmonary disease) ([place])   . Hyperlipidemia   . Hypertension   . NICM (nonischemic cardiomyopathy) ([place])    a. 07/2016 Echo: EF 30-35%, sev mid-apicalanteroseptal, ant, inf HK, Gr1 DD, mild MR, mildly dil LA, PASP 55mmHg - ? stress induced CM.  Marland Kitchen Nonobstructive Coronary atherosclerosis    a. 07/2016 Cath: LM nl, LAD nl, D1 40ost, LCX 32m, RCA nl, EF 25-30%.  Marland Kitchen PAF (paroxysmal atrial fibrillation) ([place])    a. Dx 08/2013. CHA2DS2VASc = 6-->no OAC 2/2 h/o falls and nocompliance.  . Tremor, essential    sees [facility] neurologist  . Type 2 diabetes mellitus with complication, without long-term current use of insulin ([place]) 08/01/2012  . Urinary incontinence     Medications:  Medications Prior to Admission  Medication Sig Dispense Refill Last Dose  . acetaminophen (TYLENOL) 325 MG tablet Take 2 tablets (650 mg total) by mouth every 6 (six) hours as needed for mild pain (or Fever >/= 101).    Unknown at PRN  . albuterol (VENTOLIN HFA) 108 (90 Base) MCG/ACT inhaler Inhale 2 puffs into the lungs every 2 (two) hours as needed for wheezing.   Unknown at PRN  . amLODipine (NORVASC) 2.5 MG tablet Take 1 tablet (2.5 mg total) by mouth daily.   11/13/2018 at 0900  . aspirin EC 81 MG tablet Take 81 mg by mouth daily.   11/13/2018 at 0900  . atorvastatin (LIPITOR) 20 MG tablet Take 1 tablet (20 mg total) by mouth daily. 90 tablet 3 11/12/2018 at 1800  . gabapentin (NEURONTIN) 100 MG capsule Take 100 mg by mouth at bedtime.    11/12/2018 at 2100  . glimepiride (AMARYL) 2 MG tablet Take 2 mg by mouth daily with breakfast.   11/13/2018 at 0900  . lisinopril (PRINIVIL,ZESTRIL) 20 MG tablet Take 1 tablet (20 mg total) by mouth daily. 90 tablet 1 11/13/2018 at 0900  . metFORMIN (GLUCOPHAGE) 500 MG tablet TAKE 1 TABLET BY MOUTH TWICE A DAY (Patient taking differently: Take 500 mg by mouth 2 (two) times daily. ) 180 tablet 2 11/13/2018 at 0730  . pantoprazole (PROTONIX) 40 MG tablet Take 1 tablet (40 mg total) by mouth daily. 90 tablet 1 11/13/2018 at 0900  . PARoxetine (PAXIL) 30 MG tablet Take 30 mg by mouth daily.   11/13/2018 at 0900  . polyethylene glycol (MIRALAX / GLYCOLAX) 17 g  packet Take 17 g by mouth daily as needed for mild constipation. 14 each 0 Unknown at PRN  . primidone (MYSOLINE) 50 MG tablet Take 100 mg by mouth 3 (three) times daily.    11/13/2018 at 0600  . QUEtiapine (SEROQUEL) 50 MG tablet Take 50 mg by mouth 2 (two) times daily.    11/13/2018 at 0600  . QUEtiapine (SEROQUEL) 50 MG tablet Take 100 mg by mouth at bedtime.   11/12/2018 at 2100  . tiotropium (SPIRIVA) 18 MCG inhalation capsule Place 18 mcg into inhaler and inhale as needed (wheezing).    Unknown at PRN  . traZODone (DESYREL) 50 MG tablet Take 100 mg by mouth at bedtime.    11/12/2018 at 2100  . vitamin C (VITAMIN C) 500 MG tablet Take 1 tablet (500 mg total) by mouth daily.   11/13/2018 at 0900   . zinc sulfate 220 (50  Zn) MG capsule Take 1 capsule (220 mg total) by mouth daily.   11/13/2018 at 0900  . albuterol (PROVENTIL) (2.5 MG/3ML) 0.083% nebulizer solution Take 3 mLs (2.5 mg total) by nebulization every 2 (two) hours as needed for wheezing. (Patient not taking: Reported on 11/13/2018) 75 mL 12 Not Taking at Unknown time  . insulin aspart (NOVOLOG) 100 UNIT/ML injection 0-9 Units, Subcutaneous, 3 times daily with mealS CBG < 70: Implement Hypoglycemia  CBG 70 - 120: 0 units CBG 121 - 150: 1 unit CBG 151 - 200: 2 units CBG 201 - 250: 3 units CBG 251 - 300: 5 units CBG 301 - 350: 7 units CBG 351 - 400: 9 units CBG > 400: call MD (Patient not taking: Reported on 11/13/2018) 10 mL 11 Not Taking at Unknown time  . predniSONE (DELTASONE) 10 MG tablet Take 40 mg daily for 1 day, 30 mg daily for 1 day, 20 mg daily for 1 days,10 mg daily for 1 day, then stop (Patient not taking: Reported on 11/13/2018) 10 tablet 0 Not Taking at Unknown time   Scheduled:  . amLODipine  2.5 mg Oral Daily  . atorvastatin  20 mg Oral q1800  . chlorhexidine  15 mL Mouth Rinse BID  . feeding supplement (NEPRO CARB STEADY)  237 mL Oral BID BM  . gabapentin  100 mg Oral QHS  . insulin aspart  0-9 Units Subcutaneous Q4H  . mouth rinse  15 mL Mouth Rinse q12n4p  . pantoprazole  40 mg Oral Daily  . PARoxetine  30 mg Oral Daily  . primidone  100 mg Oral TID  . QUEtiapine  100 mg Oral QHS  . QUEtiapine  50 mg Oral BID  . sodium chloride flush  3 mL Intravenous Q12H  . traZODone  100 mg Oral QHS   Infusions:  . dextrose    . heparin 1,250 Units/hr (11/25/18 2157)   PRN: polyethylene glycol, sodium chloride  Assessment: Pharmacy consulted to start rivaroxaban. Pt is currently on heparin gtt. Hgb slightly trending down may be dilutional. DDI with primidone but pt may not be a great candidate with warfarin as he has dementia and mostly non-verbal. Issues with taking PO medications. RN will assess patient and give PO a try.   Goal  of Therapy:  Monitor platelets by anticoagulation protocol: Yes   Plan:  Will start rivaroxaban 15 mg BID for 21 days followed by 20 mg daily thereafter. Continue to monitor Hgb/plt. Per RN [place] pt is unable to take rivaroxaban. Left heparin running. Will follow up.   Oswald Hillock,  PharmD, BCPS 11/26/2018,10:23 AM

## 2018-11-27 LAB — GLUCOSE, CAPILLARY
Glucose-Capillary: 120 mg/dL — ABNORMAL HIGH (ref 70–99)
Glucose-Capillary: 134 mg/dL — ABNORMAL HIGH (ref 70–99)
Glucose-Capillary: 144 mg/dL — ABNORMAL HIGH (ref 70–99)
Glucose-Capillary: 149 mg/dL — ABNORMAL HIGH (ref 70–99)
Glucose-Capillary: 164 mg/dL — ABNORMAL HIGH (ref 70–99)
Glucose-Capillary: 199 mg/dL — ABNORMAL HIGH (ref 70–99)
Glucose-Capillary: 209 mg/dL — ABNORMAL HIGH (ref 70–99)

## 2018-11-27 LAB — HEPARIN LEVEL (UNFRACTIONATED): Heparin Unfractionated: 0.37 IU/mL (ref 0.30–0.70)

## 2018-11-27 NOTE — Progress Notes (Signed)
This nurse assessed the right arm IV to have become edematous and the IV did not have blood return. IV team consulted. New secondary IV access obtained. Per IV team, D10 is not recommended to run longer than 24 hours in a PIV and two IVs have failed that were running D10. Marland Kitchen PICC line recommended. I will pass on to oncoming shift.

## 2018-11-27 NOTE — Progress Notes (Signed)
PROGRESS NOTE  ABI BRISBON [id number] DOB: [birth year] DOA: 11/13/2018 PCP: [name], MD  HPI/Recap of past 70 hours: 85 year old male with past medical history of Parkinson's disease with dementia, chronic systolic CHF, paroxysmal A. fib and COPD previous admission for Covid infection 1 month prior who was admitted on 11/10 with hypoxia, hypotension and altered mental status.  Patient was found to have bilateral acute pulmonary embolus.  He was admitted to the ICU or vascular surgery was consulted and patient went thrombolysis with bilateral pulmonary artery TPA.  Stabilized and transferred to a medical floor on 11/13.  Since then, patient's course noted delayed recovery with increased somnolence and very poor p.o. intake.  After discussion with family, an NG tube was attempted to be placed on 11/16.  This was unable to done after difficulties and struggling with insertion.  Since 11/20, patient has had alternating days of being more awake and alert somewhat alert and able to take p.o. versus days of full somnolence.  Have had conversations with different family members and most of them understand that this likely just means his long-term prognosis is only extended by a few weeks or a month at most.  Patient has 1 daughter who hopes that he is starting to wake will mean that he is recovering more so.  Plan is for family to have a family conference with all siblings on Friday 11/27.  Patient today awake but confused.  Eating some p.o.  Assessment/Plan: Active Problems: Acute respiratory failure with hypoxia secondary to acute massive bilateral pulmonary embolism [facility]): Status post thrombolysis by vascular surgery.  Briefly required pressor support, now weaned off and stabilized.  Currently on IV heparin infusion.  He is not able to stay awake consistently to transition to oral anticoagulation such as Xarelto.  Had a discussion with patient's daughter who is hoping that he will make a  better recovery.  I told her at this point, her only option if he is to go home is Lovenox twice a day.  She does not want an IVC filter.  However, palliative care clarified in talking to the rest the patient's family that him going home is not really an option.  There is not enough support at home.  He has awoken enough and is taking p.o. on some days so therefore, expected life expectancy to be longer than 2 weeks, but likely not longer than 1 to 2 months.  Expected that he likely will go to skilled nursing with hospice  COPD: Stable.    Hypotension in the setting of essential hypertension: Stable for now.  Resolved once off of pressor support.  Dementia with acute severe delirium: Briefly on Precedex on 11/11.  Psychiatry consulted.  Sedating medications held, allowing patient be more awake.  However, patient has alternating days of somnolence versus being awake.    Oropharyngeal dysphagia: In part due to his acute delirium, but also from his worsening dementia.  He seems to be a bit more alert and tolerating some p.o, but unfortunately, this is inconsistent only in certain days.  Paroxysmal A. fib: Rate controlled.  Not a candidate for anticoagulation, but will need blood thinning medication for PE as above.  Chronic systolic CHF: Mild decrease in EF, 45 to 50% as of 01/16/2017.  Initially slightly hypovolemic.  Now currently euvolemic.  QT prolongation: Checking daily EKG.  So far, normal.  Essential tremor: Continue primidone  Type 2 diabetes mellitus: On gabapentin for neuropathy.  Oral hypoglycemics on hold, sliding scale  only.  Code Status: DNR  Family Communication: Updated daughter at the bedside  Disposition Plan: Most family members feel that he needs hospice.  Patient's 1 daughter would like to do everything in hopes that he recovers.  There is a plan with palliative care on Friday, 11/27.  Patient will not be able to go home.  His options are to go to skilled nursing with  hospice versus hospice house.  The latter is likely not going to be an option as patient does have days of some wakefulness which likely will extend his life expectancy only by a few weeks or month though.   Consultants:  Vascular surgery  Psychiatry  Palliative care  Procedures:  TPA done 11/11  Antimicrobials:  None  DVT prophylaxis: IV heparin   Objective: Vitals:   11/27/18 0402 11/27/18 0838  BP: 107/68 123/65  Pulse: (!) 54 77  Resp: 20 17  Temp: 98.2 F (36.8 C) 98.5 F (36.9 C)  SpO2: 100% 100%    Intake/Output Summary (Last 24 hours) at 11/27/2018 1515 Last data filed at 11/27/2018 1440 Gross per 24 hour  Intake -  Output 1150 ml  Net -1150 ml   Filed Weights   11/25/18 0556 11/26/18 0509 11/27/18 0500  Weight: 78.1 kg 77.2 kg 76.3 kg   Body mass index is 24.84 kg/m.  Exam:   General: Awake, underlying dementia  HEENT: Normocephalic, atraumatic, mucous membranes are slightly dry  Neck: Supple, no JVD  Cardiovascular: Irregular rhythm, rate controlled  Respiratory:  poor inspiratory effort  Abdomen: Soft, nondistended, hypoactive bowel sounds  Musculoskeletal: No clubbing or cyanosis, 1+ pitting edema  Neuro: Minimal intention tremor when awake  Psychiatry: Chronic dementia, no evidence of acute psychosis, currently somnolent   Data Reviewed: CBC: Recent Labs  Lab 11/21/18 0354 11/22/18 0005 11/23/18 0717 11/24/18 0155 11/26/18 0559  WBC 8.6 6.7 8.7 5.7 6.1  NEUTROABS  --   --   --   --  3.9  HGB 9.3* 8.6* 8.7* 8.1* 7.6*  HCT 28.9* 26.3* 26.1* 24.4* 22.8*  MCV 104.3* 104.8* 101.6* 104.7* 102.7*  PLT 209 162 165 145* [id number]*   Basic Metabolic Panel: Recent Labs  Lab 11/21/18 0354 11/22/18 0544 11/23/18 0717 11/26/18 0559  NA 140 137 138 134*  K 3.0* 2.8* 3.6 4.0  CL 113* 110 109 108  CO2 21* 19* 20* 19*  GLUCOSE 139* 128* 147* 127*  BUN 10 8 7* 14  CREATININE 0.75 0.59* 0.80 0.79  CALCIUM 7.8* 7.6* 7.6* 7.4*    GFR: Estimated Creatinine Clearance: 70 mL/min (by C-G formula based on SCr of 0.79 mg/dL). Liver Function Tests: Recent Labs  Lab 11/21/18 0354  AST 18  ALT 11  ALKPHOS 82  BILITOT 1.0  PROT 5.7*  ALBUMIN 2.3*   No results for input(s): LIPASE, AMYLASE in the last 168 hours. No results for input(s): AMMONIA in the last 168 hours. Coagulation Profile: No results for input(s): INR, PROTIME in the last 168 hours. Cardiac Enzymes: No results for input(s): CKTOTAL, CKMB, CKMBINDEX, TROPONINI in the last 168 hours. BNP (last 3 results) No results for input(s): PROBNP in the last 8760 hours. HbA1C: No results for input(s): HGBA1C in the last 72 hours. CBG: Recent Labs  Lab 11/26/18 1954 11/27/18 0036 11/27/18 0407 11/27/18 0754 11/27/18 1135  GLUCAP 131* 149* 120* 144* 209*   Lipid Profile: No results for input(s): CHOL, HDL, LDLCALC, TRIG, CHOLHDL, LDLDIRECT in the last 72 hours. Thyroid Function Tests: No results for  input(s): TSH, T4TOTAL, FREET4, T3FREE, THYROIDAB in the last 72 hours. Anemia Panel: No results for input(s): VITAMINB12, FOLATE, FERRITIN, TIBC, IRON, RETICCTPCT in the last 72 hours. Urine analysis:    Component Value Date/Time   COLORURINE AMBER (A) 11/13/2018 1457   APPEARANCEUR CLOUDY (A) 11/13/2018 1457   APPEARANCEUR Clear 10/24/2017 1538   LABSPEC 1.030 11/13/2018 1457   PHURINE 5.0 11/13/2018 1457   GLUCOSEU NEGATIVE 11/13/2018 1457   HGBUR NEGATIVE 11/13/2018 1457   BILIRUBINUR NEGATIVE 11/13/2018 1457   BILIRUBINUR negative 02/27/2018 1636   BILIRUBINUR Negative 10/24/2017 [place] 11/13/2018 1457   PROTEINUR 30 (A) 11/13/2018 1457   UROBILINOGEN 0.2 02/27/2018 1636   NITRITE NEGATIVE 11/13/2018 1457   LEUKOCYTESUR NEGATIVE 11/13/2018 1457   Sepsis Labs: @LABRCNTIP (procalcitonin:4,lacticidven:4)  ) No results found for this or any previous visit (from the past 240 hour(s)).    Studies: No results found.   Scheduled Meds: . amLODipine  2.5 mg Oral Daily  . atorvastatin  20 mg Oral q1800  . chlorhexidine  15 mL Mouth Rinse BID  . feeding supplement (NEPRO CARB STEADY)  237 mL Oral BID BM  . gabapentin  100 mg Oral QHS  . insulin aspart  0-9 Units Subcutaneous Q4H  . mouth rinse  15 mL Mouth Rinse q12n4p  . pantoprazole  40 mg Oral Daily  . PARoxetine  30 mg Oral Daily  . primidone  100 mg Oral TID  . QUEtiapine  100 mg Oral QHS  . QUEtiapine  50 mg Oral BID  . sodium chloride flush  3 mL Intravenous Q12H  . traZODone  100 mg Oral QHS    Continuous Infusions: . heparin 1,250 Units/hr (11/26/18 2341)     LOS: 14 days     Annita Brod, MD [facility]  To reach me or the doctor on call, go to: [email or web address] Password [email or web address]  11/27/2018, 3:15 PM

## 2018-11-27 NOTE — Care Management Important Message (Signed)
Important Message  Patient Details  Name: CAEL SKUSE MRN: [id number] Date of Birth: April 11, 1933   Medicare Important Message Given:  Yes     Dannette Barbara 11/27/2018, 11:34 AM

## 2018-11-27 NOTE — Progress Notes (Signed)
ANTICOAGULATION CONSULT NOTE:  Pharmacy Consult for Heparin  Indication: pulmonary embolus  Patient Measurements: Height: 5\' 9"  (175.3 cm) Weight: 170 lb 4.8 oz (77.2 kg) IBW/kg (Calculated) : 70.7  Use TBW for heparin dosing.  Vital Signs: Temp: 98.2 F (36.8 C) (11/24 0402) Temp Source: Oral (11/24 0402) BP: 107/68 (11/24 0402) Pulse Rate: 54 (11/24 0402)  Labs: Recent Labs    11/25/18 0618 11/26/18 0559 11/27/18 0507  HGB  --  7.6*  --   HCT  --  22.8*  --   PLT  --  123*  --   HEPARINUNFRC 0.32 0.41 0.37  CREATININE  --  0.79  --     Estimated Creatinine Clearance: 70 mL/min (by C-G formula based on SCr of 0.79 mg/dL).  Assessment: Pharmacy consulted to dose heparin in this 85 year old male admitted with PE.  No prior anticoagulation noted. H&H trending up, PLT WNL, baseline INR 1.2, aPTT 32s.  Heparin Course: 11/13 0345 HL 0.31, inc to 1000 units/hr 11/13 1356 HL 0.30, inc to 1100 units/hr 11/14 0012 HL 0.26, inc to 1250 units/hr 11/14 1131 HL 0.49, therapeutic x1 11/14 1938 HL 0.50, therapeutic x 2 11/15 0531 HL 0.50, therapeutic x 3 11/16 0453 HL 0.51, therapeutic x 4, CBC stable 11/18 1452 HL 0.26, subtherapeutic  11/19 0000 HL 0.63 therapeutic @ 1450 units/hr 11/19 0754 HL 0.83: decrease to 1300 units/hr 11/19 1620 HL 0.69  Decrease to 1200 units/hr 11/20 0717 HL 0.27 increase to 1250 units/hr 11/20 1755 HL 0.50. Therapeutic, though significant increase s/sp rate increase. Confirmed no interruptions.  11/21 @ 0200 HL 0.57  Goal of Therapy:  Heparin level 0.3-0.7 units/ml Monitor platelets by anticoagulation protocol: Yes   Plan:  11/23 0600 @  HL 0.41 therapeutic. Will continue current rate and will recheck HL w/ am labs. CBC continues to decline will continue to monitor.  11/24: HL @ 0507 = 0.37.  Will continue this pt on current rate and recheck HL on 11/25 with AM labs.   Pharmacy will continue to follow.   Orene [facility]anctis [facility] Clinical  Pharmacist 11/27/2018 6:08 AM

## 2018-11-27 NOTE — Progress Notes (Signed)
PALLIATIVE NOTE:   Patient awake and alert. Moves lips to verbal commands as to attempt to say something with no response. Will respond with no consideration to verbal question. Stated "just put me in a box that will be better. Let's go now and go fast!" He did eat some breakfast with assistance. Daughter, Chase Simmons is at the bedside.   Chase Simmons reports she is pleased with seeing him awake and willing to eat some today. She shares how he did not have a good day yesterday and would not eat much and was asleep most of the day with some intermittent awakeness. Chase Simmons states she is hopeful he will improve, receive PT, and can come home with family and continue to show improvement.   I used this opportunity to re-emphasize to Chase Simmons best case and worst case scenario given patient's wax and wane between good and bad days. Discussed given patient's minimal amount of po intake and the need to maintain sufficient nutrition to sustain life. Family previously expressed wishes for no PEG and daughter confirms this is still their wishes. Chase Simmons verbalized understanding however, continued to express her hopes that patient will improve making statement "he will be ok once he gets home. He will get stronger and do better!"   I used this opportunity to discuss if all siblings were onboard with her expressed wishes. Chase Simmons reports "probably not, but I will tell them!" She reports he will go home to her or her sisters apartment and they will have to make room for him and hoping he can receive PT and caregivers in the home to assist. She reports prior to him going to SNF he was receiving some home care and was planning to reach out to see if they were still available. I attempted to discuss differences in goals based on previous discussions with patient's other children "Chase Simmons and Chase Simmons". Chase Simmons changed the subject and stated she wanted to focus on her father getting better.   All questions answered.    I received a call from  B and E (patient's daughter) expressing wishes to meet with family again and discuss goals of care. She continues to express wishes of her and her brother for her father to be kept comfortable knowing his prognosis and their wishes for him not to suffer or to be forced to do things he does not or can't do. Therapeutic listening and support given. I encouraged Chase Simmons to speak with all siblings and gain an understanding. She verbalized understanding. Family is requesting to meet Friday at [place] for final goals of care and decision making. Chase Simmons is hopeful they will come to a mutual agreement.   Plan -DNR/DNI -No PEG -Family requesting follow up [place] meeting Friday 11/27 @1000am .  -Continue with current plan of care per attending -PMT will continue to support and follow   Total Time: 45 min.   Greater than 50%  of this time was spent counseling and coordinating care related to the above assessment and plan.  Alda Lea, AGPCNP-BC Palliative Medicine Team

## 2018-11-28 DIAGNOSIS — I4891 Unspecified atrial fibrillation: Secondary | ICD-10-CM

## 2018-11-28 LAB — HEPARIN LEVEL (UNFRACTIONATED): Heparin Unfractionated: 0.42 IU/mL (ref 0.30–0.70)

## 2018-11-28 LAB — GLUCOSE, CAPILLARY
Glucose-Capillary: 68 mg/dL — ABNORMAL LOW (ref 70–99)
Glucose-Capillary: 99 mg/dL (ref 70–99)
Glucose-Capillary: 114 mg/dL — ABNORMAL HIGH (ref 70–99)
Glucose-Capillary: 228 mg/dL — ABNORMAL HIGH (ref 70–99)
Glucose-Capillary: 131 mg/dL — ABNORMAL HIGH (ref 70–99)
Glucose-Capillary: 93 mg/dL (ref 70–99)

## 2018-11-28 MED ORDER — MORPHINE SULFATE (PF) 2 MG/ML IV SOLN: 2.0000 mg | INTRAVENOUS | Status: DC | PRN

## 2018-11-28 MED ORDER — LORAZEPAM 2 MG/ML IJ SOLN
0.5000 mg | Freq: Four times a day (QID) | INTRAMUSCULAR | Status: DC | PRN
  Administered 2018-11-28 – 2018-11-30 (×2): 0.5 mg via INTRAVENOUS
  Filled 2018-11-28 (×2): qty 1

## 2018-11-28 MED ORDER — OXYCODONE-ACETAMINOPHEN 5-325 MG PO TABS
1.0000 | ORAL_TABLET | Freq: Four times a day (QID) | ORAL | Status: DC | PRN
  Administered 2018-11-28 – 2018-11-30 (×2): 1 via ORAL
  Filled 2018-11-28 (×2): qty 1

## 2018-11-28 NOTE — Progress Notes (Signed)
Hypoglycemic Event  CBG: 68  Treatment: Patient able to take thickened liquids and was given nectar thick sweet tea by spoon.  Symptoms: Patient exhibited no new symptoms (patient has tremors at baseline), was slightly tachycardic (110s).  Follow-up CBG:  Time: 0510 CBG Result: 99  Possible Reasons for Event: Patient received 2 units of insulin at 0000 per MAR orders; for a blood glucose of 164. Patient was sleeping, no fluids or food given.    Huey Romans

## 2018-11-28 NOTE — Progress Notes (Signed)
ANTICOAGULATION CONSULT NOTE:  Pharmacy Consult for Heparin  Indication: pulmonary embolus  Patient Measurements: Height: 5\' 9"  (175.3 cm) Weight: 168 lb 4.8 oz (76.3 kg) IBW/kg (Calculated) : 70.7  Use TBW for heparin dosing.  Vital Signs: Temp: 98.5 F (36.9 C) (11/24 1948) Temp Source: Oral (11/24 1948) BP: 117/69 (11/25 0406) Pulse Rate: 97 (11/25 0406)  Labs: Recent Labs    11/26/18 0559 11/27/18 0507 11/28/18 0546  HGB 7.6*  --   --   HCT 22.8*  --   --   PLT 123*  --   --   HEPARINUNFRC 0.41 0.37 0.42  CREATININE 0.79  --   --     Estimated Creatinine Clearance: 70 mL/min (by C-G formula based on SCr of 0.79 mg/dL).  Assessment: Pharmacy consulted to dose heparin in this 85 year old male admitted with PE.  No prior anticoagulation noted. H&H trending up, PLT WNL, baseline INR 1.2, aPTT 32s.  Heparin Course: 11/13 0345 HL 0.31, inc to 1000 units/hr 11/13 1356 HL 0.30, inc to 1100 units/hr 11/14 0012 HL 0.26, inc to 1250 units/hr 11/14 1131 HL 0.49, therapeutic x1 11/14 1938 HL 0.50, therapeutic x 2 11/15 0531 HL 0.50, therapeutic x 3 11/16 0453 HL 0.51, therapeutic x 4, CBC stable 11/18 1452 HL 0.26, subtherapeutic  11/19 0000 HL 0.63 therapeutic @ 1450 units/hr 11/19 0754 HL 0.83: decrease to 1300 units/hr 11/19 1620 HL 0.69  Decrease to 1200 units/hr 11/20 0717 HL 0.27 increase to 1250 units/hr 11/20 1755 HL 0.50. Therapeutic, though significant increase s/sp rate increase. Confirmed no interruptions.  11/21 @ 0200 HL 0.57  Goal of Therapy:  Heparin level 0.3-0.7 units/ml Monitor platelets by anticoagulation protocol: Yes   Plan:  11/23 0600 @  HL 0.41 therapeutic. Will continue current rate and will recheck HL w/ am labs. CBC continues to decline will continue to monitor.  11/24: HL @ 0507 = 0.37.  Will continue this pt on current rate and recheck HL on 11/25 with AM labs.   11/24: HL @ 0546 = 0.42 Will continue this pt on current rate and recheck  HL on 11/26 with AM labs.   Pharmacy will continue to follow.   Orene [facility]anctis [facility] Clinical Pharmacist 11/28/2018 7:22 AM

## 2018-11-28 NOTE — Progress Notes (Signed)
PROGRESS NOTE    Chase Simmons  [id number] DOB: [birth year] DOA: 11/13/2018 PCP: [name], MD    Brief Narrative:  85 year old male with past medical history of Parkinson's disease with dementia, chronic systolic CHF, paroxysmal A. fib and COPD previous admission for Covid infection 1 month prior who was admitted on 11/10 with hypoxia, hypotension and altered mental status.  Patient was found to have bilateral acute pulmonary embolus.  He was admitted to the ICU or vascular surgery was consulted and patient went thrombolysis with bilateral pulmonary artery TPA.  Stabilized and transferred to a medical floor on 11/13.  Since then, patient's course noted delayed recovery with increased somnolence and very poor p.o. intake.  After discussion with family, an NG tube was attempted to be placed on 11/16.  This was unable to done after difficulties and struggling with insertion.  On morning of 11/20 has become more awake and is eating somewhat.  After discussion with family, they would like to see if this improvement continues, and then plan to take patient home.  On 11/21 pt became more somnolent . Was not really awakening. 2 children want hospice but one daughter refusing, family meeting plan on Friday with palliative care.     Consultants:   Vascular surgery  Psychiatry  Palliative care   Procedures: TPA done 11/11  Antimicrobials:   none   Subjective: Not cooperative with exam. Doesn't really respond to my questions. Per nsg, pt not taking much po intake.  Objective: Vitals:   11/27/18 1948 11/28/18 0406 11/28/18 0752 11/28/18 0812  BP: (!) 90/47 117/69 104/76   Pulse: 68 97 (!) 116 98  Resp: 18  19   Temp: 98.5 F (36.9 C)  99.6 F (37.6 C)   TempSrc: Oral  Oral   SpO2: 99% 94% 94%   Weight:  76.3 kg    Height:        Intake/Output Summary (Last 24 hours) at 11/28/2018 1805 Last data filed at 11/28/2018 0422 Gross per 24 hour  Intake -  Output 350  ml  Net -350 ml   Filed Weights   11/26/18 0509 11/27/18 0500 11/28/18 0406  Weight: 77.2 kg 76.3 kg 76.3 kg    Examination:  General exam: nad, somnolent, not cooperative with exam Respiratory system: Clear to auscultation with poor respiratory effort Cardiovascular system: S1 & S2 heard, IRR. No JVD, murmurs, rubs, gallops or clicks.  Gastrointestinal system: Abdomen is nondistended, soft and nontender. No organomegaly or masses felt. Normal bowel sounds heard. Central nervous system: somnolent.unable to assess fully Extremities: +mild pitting edema, no cyanosis Skin: warm, dry     Data Reviewed: I have personally reviewed following labs and imaging studies  CBC: Recent Labs  Lab 11/22/18 0005 11/23/18 0717 11/24/18 0155 11/26/18 0559  WBC 6.7 8.7 5.7 6.1  NEUTROABS  --   --   --  3.9  HGB 8.6* 8.7* 8.1* 7.6*  HCT 26.3* 26.1* 24.4* 22.8*  MCV 104.8* 101.6* 104.7* 102.7*  PLT 162 165 145* [id number]*   Basic Metabolic Panel: Recent Labs  Lab 11/22/18 0544 11/23/18 0717 11/26/18 0559  NA 137 138 134*  K 2.8* 3.6 4.0  CL 110 109 108  CO2 19* 20* 19*  GLUCOSE 128* 147* 127*  BUN 8 7* 14  CREATININE 0.59* 0.80 0.79  CALCIUM 7.6* 7.6* 7.4*   GFR: Estimated Creatinine Clearance: 70 mL/min (by C-G formula based on SCr of 0.79 mg/dL). Liver Function Tests: No results for input(s): AST, ALT,  ALKPHOS, BILITOT, PROT, ALBUMIN in the last 168 hours. No results for input(s): LIPASE, AMYLASE in the last 168 hours. No results for input(s): AMMONIA in the last 168 hours. Coagulation Profile: No results for input(s): INR, PROTIME in the last 168 hours. Cardiac Enzymes: No results for input(s): CKTOTAL, CKMB, CKMBINDEX, TROPONINI in the last 168 hours. BNP (last 3 results) No results for input(s): PROBNP in the last 8760 hours. HbA1C: No results for input(s): HGBA1C in the last 72 hours. CBG: Recent Labs  Lab 11/28/18 0409 11/28/18 0510 11/28/18 0754 11/28/18 1156  11/28/18 1701  GLUCAP 68* 99 114* 228* 131*   Lipid Profile: No results for input(s): CHOL, HDL, LDLCALC, TRIG, CHOLHDL, LDLDIRECT in the last 72 hours. Thyroid Function Tests: No results for input(s): TSH, T4TOTAL, FREET4, T3FREE, THYROIDAB in the last 72 hours. Anemia Panel: No results for input(s): VITAMINB12, FOLATE, FERRITIN, TIBC, IRON, RETICCTPCT in the last 72 hours. Sepsis Labs: No results for input(s): PROCALCITON, LATICACIDVEN in the last 168 hours.  No results found for this or any previous visit (from the past 240 hour(s)).       Radiology Studies: No results found.      Scheduled Meds: . amLODipine  2.5 mg Oral Daily  . atorvastatin  20 mg Oral q1800  . chlorhexidine  15 mL Mouth Rinse BID  . feeding supplement (NEPRO CARB STEADY)  237 mL Oral BID BM  . gabapentin  100 mg Oral QHS  . insulin aspart  0-9 Units Subcutaneous Q4H  . mouth rinse  15 mL Mouth Rinse q12n4p  . pantoprazole  40 mg Oral Daily  . PARoxetine  30 mg Oral Daily  . primidone  100 mg Oral TID  . QUEtiapine  100 mg Oral QHS  . QUEtiapine  50 mg Oral BID  . sodium chloride flush  3 mL Intravenous Q12H  . traZODone  100 mg Oral QHS   Continuous Infusions: . heparin Stopped (11/28/18 0711)    Assessment & Plan:   Active Problems:   HTN (hypertension)   Type 2 diabetes mellitus with complication, without long-term current use of insulin (HCC)   Tremor, essential   Goals of care, counseling/discussion   PAF (paroxysmal atrial fibrillation) (HCC)   COPD (chronic obstructive pulmonary disease) (HCC)   Chronic systolic CHF (congestive heart failure) (HCC)   Acute respiratory failure with hypoxia (HCC)   Acute massive pulmonary embolism (HCC)   Pressure injury of skin   Palliative care by specialist   Hypotension   Agitation   Dementia with behavioral disturbance ([place])   Oropharyngeal dysphagia   Advanced care planning/counseling discussion   1.Acute respiratory failure with  hypoxia secondary to acute massive bilateral pulmonary embolism - Status post thrombolysis by vascular surgery.  Briefly required pressor support, now weaned off and stabilized.  Currently on IV heparin infusion.  Briefly appeared to be slowly improving and hoping to transition to oral medications in the next 24 hours.  If patient is unable to take p.o. consistently and family does not opt for comfort care, would need IVC filter. Family meeting for hospice care on friday  2.COPD: Stable.  3.Hypotension in the setting of essential hypertension: Stable for now.  Resolved once off of pressor support.  4.Dementia with acute severe delirium: Briefly on Precedex on 11/11.  Psychiatry consulted.  Sedating medications held, allowing patient be more awake.  Has been aggitated some per nsg. Will give low dose ativan prn   5. Oropharyngeal dysphagia: In part due to  his acute delirium, but also from his worsening dementia.  Poor po intake now.  6.Paroxysmal A. fib: Rate controlled.  Not a candidate for anticoagulation, but will need blood thinning medication for PE as above.  7.Chronic systolic CHF: Mild decrease in EF, 45 to 50% as of 01/16/2017.  Initially slightly hypovolemic. -euvolemic without exacerbation  8.QT prolongation: monitor ekg.   Holding haldol Tele d/c'd as pt is dnr  9.Essential tremor: Continue primidone  10.Type 2 diabetes mellitus: On gabapentin for neuropathy.  Oral hypoglycemics on hold, sliding scale only.  11.Decrease h/h- on heparin, will ck occult stool  DVT prophylaxis: heparin Code Status:dnr Family Communication: spoke to the daughter who would like to proceed with hospice.  Discussed extensively patient's prognosis.  Her and her brother are interested in hospice and comfort measure and will have a family meeting and a decision will be made on Friday along with palliative care to participate in meeting. Disposition Plan: family meeting on Friday for hospice  v.s. no hospice.        LOS: 15 days   Time spent: 45 minutes with more than 50% on [place], MD [facility] Pager [phone]  If 7PM-7AM, please contact night-coverage [email or web address] Password [facility] 11/28/2018, 6:05 PM

## 2018-11-28 NOTE — Progress Notes (Signed)
PALLIATIVE NOTE:  Patient continues to have poor po intake with small amounts of sips and bites when he does take in po. Hypoglycemic episode overnight which was treated with tea. Continues to be somewhat somnolent with some intermittent periods of being awake and alert. Confused.   Daughter, [name] at the bedside. Updates were being provided by Dr. Kurtis Bushman. Daughter verbalized understanding and continued to express that she and her brother [name] are leaning towards hospice care and comfort for their father. They understand his disease trajectory and not signs of significant improvement or increased quality of life.   Family has requested follow up goals of care discussion on Friday 11/30/18 at 1300pm to make final decisions.   Assessment -somnolent, will awaken, chronically-ill appearing  -diminished bilaterally -RRR -will not follow commands, confused  Plan -DNR/DNI -Family continues to disagree (2 children want comfort/hospice 1 daughter [name]) is hopeful for improvement and home.  -Family has requested follow-up [place] on 11/30/18 @ 1300. I will meet them at the bedside/conference room  -PMT will continue to support and follow. No coverage tomorrow 11/29/18  Total Time: 20 min.   Greater than 50%  of this time was spent counseling and coordinating care related to the above assessment and plan.  Alda Lea, AGPCNP-BC Palliative Medicine Team

## 2018-11-28 NOTE — TOC Progression Note (Signed)
Transition of Care [facility]) - Progression Note    Patient Details  Name: Chase Simmons MRN: [id number] Date of Birth: 03-07-1933  Transition of Care [facility]) CM/SW Contact  Ross Ludwig, [place] Phone Number: 11/28/2018, 6:50 PM  Clinical Narrative:     Patient's family are meeting with palliative on Friday at [place] spoke to patient's daughter [name] [phone], they are leaning towards hospice facility if he is eligible.  Patient's daughter expressed they don't feel like patient is improving anymore, and he is not eating very much anymore.  Patient's other daughter [name] is not ready for hospice, but patient's daughter [name] and son [name] do feel it is time.  CSW awaiting result of family meeting on Friday.     Expected Discharge Plan: [place] Barriers to Discharge: Continued Medical Work up  Expected Discharge Plan and Services Expected Discharge Plan: [place] In-house Referral: Hospice / [place], Clinical Social Work     Living arrangements for the past 2 months: [place]                                       Social Determinants of Health (SDOH) Interventions    Readmission Risk Interventions Readmission Risk Prevention Plan 11/16/2018 06/07/2018  Transportation Screening Complete Complete  PCP or Specialist Appt within 3-5 Days - Complete  HRI or [place] - Complete  Social Work Consult for [place] Planning/Counseling - Not Complete  Medication Review Press photographer) - Complete  PCP or Specialist appointment within 3-5 days of discharge Complete -  [place] or [place] Complete -  SW Recovery Care/Counseling Consult Complete -  Palliative Care Screening Complete -  [place] Not Applicable -  Some recent data might be hidden

## 2018-11-29 DIAGNOSIS — I441 Atrioventricular block, second degree: Secondary | ICD-10-CM

## 2018-11-29 LAB — CBC
HCT: 22.2 % — ABNORMAL LOW (ref 39.0–52.0)
Hemoglobin: 7.1 g/dL — ABNORMAL LOW (ref 13.0–17.0)
MCH: 34.3 pg — ABNORMAL HIGH (ref 26.0–34.0)
MCHC: 32 g/dL (ref 30.0–36.0)
MCV: 107.2 fL — ABNORMAL HIGH (ref 80.0–100.0)
Platelets: 68 10*3/uL — ABNORMAL LOW (ref 150–400)
RBC: 2.07 MIL/uL — ABNORMAL LOW (ref 4.22–5.81)
RDW: 16.9 % — ABNORMAL HIGH (ref 11.5–15.5)
WBC: 5.6 10*3/uL (ref 4.0–10.5)
nRBC: 0 % (ref 0.0–0.2)

## 2018-11-29 LAB — GLUCOSE, CAPILLARY
Glucose-Capillary: 113 mg/dL — ABNORMAL HIGH (ref 70–99)
Glucose-Capillary: 115 mg/dL — ABNORMAL HIGH (ref 70–99)
Glucose-Capillary: 116 mg/dL — ABNORMAL HIGH (ref 70–99)
Glucose-Capillary: 131 mg/dL — ABNORMAL HIGH (ref 70–99)
Glucose-Capillary: 147 mg/dL — ABNORMAL HIGH (ref 70–99)
Glucose-Capillary: 97 mg/dL (ref 70–99)

## 2018-11-29 LAB — HEPARIN LEVEL (UNFRACTIONATED)
Heparin Unfractionated: 0.26 IU/mL — ABNORMAL LOW (ref 0.30–0.70)
Heparin Unfractionated: 0.34 IU/mL (ref 0.30–0.70)
Heparin Unfractionated: 0.38 IU/mL (ref 0.30–0.70)

## 2018-11-29 NOTE — Plan of Care (Signed)
  Problem: Safety: Goal: Ability to remain free from injury will improve Outcome: Progressing   

## 2018-11-29 NOTE — Progress Notes (Signed)
ANTICOAGULATION CONSULT NOTE:  Pharmacy Consult for Heparin  Indication: pulmonary embolus  Patient Measurements: Height: 5\' 9"  (175.3 cm) Weight: 168 lb (76.2 kg) IBW/kg (Calculated) : 70.7  Use TBW for heparin dosing.  Vital Signs: Temp: 98.6 F (37 C) (11/26 1922) Temp Source: Oral (11/26 1922) BP: 110/75 (11/26 2200) Pulse Rate: 70 (11/26 1922)  Labs: Recent Labs    11/29/18 0431 11/29/18 1437 11/29/18 2245  HGB 7.1*  --   --   HCT 22.2*  --   --   PLT 68*  --   --   HEPARINUNFRC 0.26* 0.34 0.38    Estimated Creatinine Clearance: 70 mL/min (by C-G formula based on SCr of 0.79 mg/dL).  Assessment: Pharmacy consulted to dose heparin in this 85 year old male admitted with PE.  No prior anticoagulation noted. H&H trending up, PLT WNL, baseline INR 1.2, aPTT 32s.  Heparin Course: 11/13 0345 HL 0.31, inc to 1000 units/hr 11/13 1356 HL 0.30, inc to 1100 units/hr 11/14 0012 HL 0.26, inc to 1250 units/hr 11/14 1131 HL 0.49, therapeutic x1 11/14 1938 HL 0.50, therapeutic x 2 11/15 0531 HL 0.50, therapeutic x 3 11/16 0453 HL 0.51, therapeutic x 4, CBC stable 11/18 1452 HL 0.26, subtherapeutic  11/19 0000 HL 0.63 therapeutic @ 1450 units/hr 11/19 0754 HL 0.83: decrease to 1300 units/hr 11/19 1620 HL 0.69  Decrease to 1200 units/hr 11/20 0717 HL 0.27 increase to 1250 units/hr 11/20 1755 HL 0.50. Therapeutic, though significant increase s/sp rate increase. Confirmed no interruptions.  11/21 @ 0200 HL 0.57 11/23 0600 @  HL 0.41 therapeutic.  11/24: HL @ 0507 = 0.37.  11/24: HL @ [id number] = 0.42  11/26: HL @ 0431 = 0.26, subtherapeutic just below goal.  Increase to 1300 units/hr and recheck in 8hrs  Goal of Therapy:  Heparin level 0.3-0.7 units/ml Monitor platelets by anticoagulation protocol: Yes   Plan:  11/26 @ 1437 HL 0.34 therapeutic x 1 11/26 @ 2245 HL 0.38, therapeutic x 2  Will continue current rate and will recheck HL daily per protocol  CBC continues to  decline will continue to monitor - will recheck with am labs  Pharmacy will continue to follow.   Ena Dawley, PharmD Clinical Pharmacist 11/29/2018 11:43 PM

## 2018-11-29 NOTE — Progress Notes (Signed)
PROGRESS NOTE    CORD STRAUGHAN  [id number] DOB: Jan 22, 1933 DOA: 11/13/2018 PCP: [name], MD    Brief Narrative:  85 year old male with past medical history of Parkinson's disease with dementia, chronic systolic CHF, paroxysmal A. fib and COPD previous admission for Covid infection 1 month prior who was admitted on 11/10 with hypoxia, hypotension and altered mental status.  Patient was found to have bilateral acute pulmonary embolus.  He was admitted to the ICU or vascular surgery was consulted and patient went thrombolysis with bilateral pulmonary artery TPA.  Stabilized and transferred to a medical floor on 11/13.  Since then, patient's course noted delayed recovery with increased somnolence and very poor p.o. intake.  After discussion with family, an NG tube was attempted to be placed on 11/16.  This was unable to done after difficulties and struggling with insertion.  On morning of 11/20 has become more awake and is eating somewhat.  After discussion with family, they would like to see if this improvement continues, and then plan to take patient home.  On 11/21 pt became more somnolent . Was not really awakening. 2 children want hospice but one daughter refusing, family meeting plan on Friday with palliative care.     Consultants:   Vascular surgery  Psychiatry  Palliative care   Procedures: TPA done 11/11  Antimicrobials:   none   Subjective: Pt laying in bed, not too cooperative. Does not answer my questions this am.  Objective: Vitals:   11/28/18 0812 11/28/18 1937 11/29/18 0346 11/29/18 0741  BP:  (!) 143/86 118/60 117/66  Pulse: 98 (!) 102 94 79  Resp:    19  Temp:  98.8 F (37.1 C) 98.4 F (36.9 C) 98.7 F (37.1 C)  TempSrc:  Oral Oral Oral  SpO2:  91% 100% 94%  Weight:   76.2 kg   Height:        Intake/Output Summary (Last 24 hours) at 11/29/2018 0759 Last data filed at 11/29/2018 0500 Gross per 24 hour  Intake 778.53 ml  Output 0 ml    Net 778.53 ml   Filed Weights   11/27/18 0500 11/28/18 0406 11/29/18 0346  Weight: 76.3 kg 76.3 kg 76.2 kg    Examination:  General exam: nad, not cooperative with exam Respiratory system: Clear to auscultation with poor respiratory effort, no r/r/w Cardiovascular system: S1 & S2 heard, IRR.no r/m/g Gastrointestinal system: Abdomen is nondistended, soft and nontender. +bs Central nervous system: unable to assess Extremities: +mild pitting edema, no cyanosis Skin: warm, dry     Data Reviewed: I have personally reviewed following labs and imaging studies  CBC: Recent Labs  Lab 11/23/18 0717 11/24/18 0155 11/26/18 0559 11/29/18 0431  WBC 8.7 5.7 6.1 5.6  NEUTROABS  --   --  3.9  --   HGB 8.7* 8.1* 7.6* 7.1*  HCT 26.1* 24.4* 22.8* 22.2*  MCV 101.6* 104.7* 102.7* 107.2*  PLT 165 145* 123* 68*   Basic Metabolic Panel: Recent Labs  Lab 11/23/18 0717 11/26/18 0559  NA 138 134*  K 3.6 4.0  CL 109 108  CO2 20* 19*  GLUCOSE 147* 127*  BUN 7* 14  CREATININE 0.80 0.79  CALCIUM 7.6* 7.4*   GFR: Estimated Creatinine Clearance: 70 mL/min (by C-G formula based on SCr of 0.79 mg/dL). Liver Function Tests: No results for input(s): AST, ALT, ALKPHOS, BILITOT, PROT, ALBUMIN in the last 168 hours. No results for input(s): LIPASE, AMYLASE in the last 168 hours. No results for input(s):  AMMONIA in the last 168 hours. Coagulation Profile: No results for input(s): INR, PROTIME in the last 168 hours. Cardiac Enzymes: No results for input(s): CKTOTAL, CKMB, CKMBINDEX, TROPONINI in the last 168 hours. BNP (last 3 results) No results for input(s): PROBNP in the last 8760 hours. HbA1C: No results for input(s): HGBA1C in the last 72 hours. CBG: Recent Labs  Lab 11/28/18 1156 11/28/18 1701 11/28/18 2117 11/29/18 0508 11/29/18 0723  GLUCAP 228* 131* 93 115* 97   Lipid Profile: No results for input(s): CHOL, HDL, LDLCALC, TRIG, CHOLHDL, LDLDIRECT in the last 72  hours. Thyroid Function Tests: No results for input(s): TSH, T4TOTAL, FREET4, T3FREE, THYROIDAB in the last 72 hours. Anemia Panel: No results for input(s): VITAMINB12, FOLATE, FERRITIN, TIBC, IRON, RETICCTPCT in the last 72 hours. Sepsis Labs: No results for input(s): PROCALCITON, LATICACIDVEN in the last 168 hours.  No results found for this or any previous visit (from the past 240 hour(s)).       Radiology Studies: No results found.      Scheduled Meds:  amLODipine  2.5 mg Oral Daily   atorvastatin  20 mg Oral q1800   chlorhexidine  15 mL Mouth Rinse BID   feeding supplement (NEPRO CARB STEADY)  237 mL Oral BID BM   gabapentin  100 mg Oral QHS   insulin aspart  0-9 Units Subcutaneous Q4H   mouth rinse  15 mL Mouth Rinse q12n4p   pantoprazole  40 mg Oral Daily   PARoxetine  30 mg Oral Daily   primidone  100 mg Oral TID   QUEtiapine  100 mg Oral QHS   QUEtiapine  50 mg Oral BID   sodium chloride flush  3 mL Intravenous Q12H   traZODone  100 mg Oral QHS   Continuous Infusions:  heparin 1,300 Units/hr (11/29/18 0555)    Assessment & Plan:   Active Problems:   HTN (hypertension)   Type 2 diabetes mellitus with complication, without long-term current use of insulin (HCC)   Tremor, essential   Goals of care, counseling/discussion   PAF (paroxysmal atrial fibrillation) (HCC)   COPD (chronic obstructive pulmonary disease) (HCC)   Chronic systolic CHF (congestive heart failure) (HCC)   Acute respiratory failure with hypoxia (HCC)   Acute massive pulmonary embolism (HCC)   Pressure injury of skin   Palliative care by specialist   Hypotension   Agitation   Dementia with behavioral disturbance ([place])   Oropharyngeal dysphagia   Advanced care planning/counseling discussion   1.Acute respiratory failure with hypoxia secondary to acute massive bilateral pulmonary embolism - Status post thrombolysis by vascular surgery.  Briefly required pressor support,  now weaned off and stabilized.   Currently on IV heparin infusion.   Briefly appeared to be slowly improving and hoping to transition to oral medications in the next 24 hours.   If patient is unable to take p.o. consistently and family does not opt for comfort care, would need IVC filter. Family meeting with palliative care about pt being placed on hospice care on friday  2.COPD: Stable.  3.Hypotension in the setting of essential hypertension:  Resolved once off of pressor support. Stable now  4.Dementia with acute severe delirium: Briefly on Precedex on 11/11.  Psychiatry consulted.   Sedating medications held, allowing patient be more awake.  Has been aggitated some per nsg- on  low dose ativan prn   5. Oropharyngeal dysphagia: In part due to his acute delirium, but also from his worsening dementia.  Continues with poor  po intake .  6.Paroxysmal A. fib: Rate controlled.  Not a candidate for anticoagulation, but will need blood thinning medication for PE as above.  7.Chronic systolic CHF: Mild decrease in EF, 45 to 50% as of 01/16/2017.  Initially slightly hypovolemic. -euvolemic without exacerbation  8.QT prolongation: monitor ekg.   Holding haldol   9.Essential tremor: Continue primidone  10.Type 2 diabetes mellitus: On gabapentin for neuropathy.  Oral hypoglycemics on hold, sliding scale only.  11.Decrease h/h- on heparin, will ck occult stool  DVT prophylaxis: heparin Code Status:dnr Family Communication: none at bedside Disposition Plan: family meeting on Friday for hospice v.s. no hospice.        LOS: 16 days   Time spent: 35 minutes with more than 50% on [place], MD [facility] Pager [phone]  If 7PM-7AM, please contact night-coverage [email or web address] Password [facility] 11/29/2018, 7:59 AM Patient ID: PRANAV ANDERLE, male   DOB: 04-Jun-1933, 85 y.o.   MRN: [id number]

## 2018-11-29 NOTE — Plan of Care (Signed)

## 2018-11-29 NOTE — Progress Notes (Signed)
ANTICOAGULATION CONSULT NOTE:  Pharmacy Consult for Heparin  Indication: pulmonary embolus  Patient Measurements: Height: 5\' 9"  (175.3 cm) Weight: 168 lb (76.2 kg) IBW/kg (Calculated) : 70.7  Use TBW for heparin dosing.  Vital Signs: Temp: 98.4 F (36.9 C) (11/26 0346) Temp Source: Oral (11/26 0346) BP: 118/60 (11/26 0346) Pulse Rate: 94 (11/26 0346)  Labs: Recent Labs    11/26/18 0559 11/27/18 0507 11/28/18 0546 11/29/18 0431  HGB 7.6*  --   --   --   HCT 22.8*  --   --   --   PLT 123*  --   --   --   HEPARINUNFRC 0.41 0.37 0.42 0.26*  CREATININE 0.79  --   --   --     Estimated Creatinine Clearance: 70 mL/min (by C-G formula based on SCr of 0.79 mg/dL).  Assessment: Pharmacy consulted to dose heparin in this 85 year old male admitted with PE.  No prior anticoagulation noted. H&H trending up, PLT WNL, baseline INR 1.2, aPTT 32s.  Heparin Course: 11/13 0345 HL 0.31, inc to 1000 units/hr 11/13 1356 HL 0.30, inc to 1100 units/hr 11/14 0012 HL 0.26, inc to 1250 units/hr 11/14 1131 HL 0.49, therapeutic x1 11/14 1938 HL 0.50, therapeutic x 2 11/15 0531 HL 0.50, therapeutic x 3 11/16 0453 HL 0.51, therapeutic x 4, CBC stable 11/18 1452 HL 0.26, subtherapeutic  11/19 0000 HL 0.63 therapeutic @ 1450 units/hr 11/19 0754 HL 0.83: decrease to 1300 units/hr 11/19 1620 HL 0.69  Decrease to 1200 units/hr 11/20 0717 HL 0.27 increase to 1250 units/hr 11/20 1755 HL 0.50. Therapeutic, though significant increase s/sp rate increase. Confirmed no interruptions.  11/21 @ 0200 HL 0.57  Goal of Therapy:  Heparin level 0.3-0.7 units/ml Monitor platelets by anticoagulation protocol: Yes   Plan:  11/23 0600 @  HL 0.41 therapeutic. Will continue current rate and will recheck HL w/ am labs. CBC continues to decline will continue to monitor.  11/24: HL @ 0507 = 0.37.  Will continue this pt on current rate and recheck HL on 11/25 with AM labs.   11/24: HL @ 0546 = 0.42 Will continue  this pt on current rate and recheck HL on 11/26 with AM labs.  11/26: HL @ 0431 = 0.26, subtherapeutic just below goal.  Increase to 1300 units/hr and recheck in 8hrs  Pharmacy will continue to follow.   Ena Dawley [facility] Clinical Pharmacist 11/29/2018 5:52 AM

## 2018-11-29 NOTE — Progress Notes (Signed)
ANTICOAGULATION CONSULT NOTE:  Pharmacy Consult for Heparin  Indication: pulmonary embolus  Patient Measurements: Height: 5\' 9"  (175.3 cm) Weight: 168 lb (76.2 kg) IBW/kg (Calculated) : 70.7  Use TBW for heparin dosing.  Vital Signs: Temp: 98.7 F (37.1 C) (11/26 0741) Temp Source: Oral (11/26 0741) BP: 117/66 (11/26 0741) Pulse Rate: 79 (11/26 0741)  Labs: Recent Labs    11/28/18 0546 11/29/18 0431 11/29/18 1437  HGB  --  7.1*  --   HCT  --  22.2*  --   PLT  --  68*  --   HEPARINUNFRC 0.42 0.26* 0.34    Estimated Creatinine Clearance: 70 mL/min (by C-G formula based on SCr of 0.79 mg/dL).  Assessment: Pharmacy consulted to dose heparin in this 85 year old male admitted with PE.  No prior anticoagulation noted. H&H trending up, PLT WNL, baseline INR 1.2, aPTT 32s.  Heparin Course: 11/13 0345 HL 0.31, inc to 1000 units/hr 11/13 1356 HL 0.30, inc to 1100 units/hr 11/14 0012 HL 0.26, inc to 1250 units/hr 11/14 1131 HL 0.49, therapeutic x1 11/14 1938 HL 0.50, therapeutic x 2 11/15 0531 HL 0.50, therapeutic x 3 11/16 0453 HL 0.51, therapeutic x 4, CBC stable 11/18 1452 HL 0.26, subtherapeutic  11/19 0000 HL 0.63 therapeutic @ 1450 units/hr 11/19 0754 HL 0.83: decrease to 1300 units/hr 11/19 1620 HL 0.69  Decrease to 1200 units/hr 11/20 0717 HL 0.27 increase to 1250 units/hr 11/20 1755 HL 0.50. Therapeutic, though significant increase s/sp rate increase. Confirmed no interruptions.  11/21 @ 0200 HL 0.57 11/23 0600 @  HL 0.41 therapeutic.  11/24: HL @ 0507 = 0.37.  11/24: HL @ [id number] = 0.42  11/26: HL @ 0431 = 0.26, subtherapeutic just below goal.  Increase to 1300 units/hr and recheck in 8hrs  Goal of Therapy:  Heparin level 0.3-0.7 units/ml Monitor platelets by anticoagulation protocol: Yes   Plan:  11/26 @ 1437 HL 0.34 therapeutic x 1  Will continue current rate and will recheck HL in 8 hours for confirmatory level.   CBC continues to decline will continue to  monitor - will recheck with am labs  Pharmacy will continue to follow.   Lu Duffel, PharmD, BCPS Clinical Pharmacist 11/29/2018 3:07 PM

## 2018-11-30 DIAGNOSIS — R531 Weakness: Secondary | ICD-10-CM

## 2018-11-30 DIAGNOSIS — I4891 Unspecified atrial fibrillation: Secondary | ICD-10-CM

## 2018-11-30 LAB — HEPARIN LEVEL (UNFRACTIONATED): Heparin Unfractionated: 0.44 IU/mL (ref 0.30–0.70)

## 2018-11-30 LAB — CBC
HCT: 23.9 % — ABNORMAL LOW (ref 39.0–52.0)
Hemoglobin: 7.8 g/dL — ABNORMAL LOW (ref 13.0–17.0)
MCH: 35.1 pg — ABNORMAL HIGH (ref 26.0–34.0)
MCHC: 32.6 g/dL (ref 30.0–36.0)
MCV: 107.7 fL — ABNORMAL HIGH (ref 80.0–100.0)
Platelets: 49 10*3/uL — ABNORMAL LOW (ref 150–400)
RBC: 2.22 MIL/uL — ABNORMAL LOW (ref 4.22–5.81)
RDW: 17.1 % — ABNORMAL HIGH (ref 11.5–15.5)
WBC: 5.9 10*3/uL (ref 4.0–10.5)
nRBC: 0 % (ref 0.0–0.2)

## 2018-11-30 LAB — GLUCOSE, CAPILLARY
Glucose-Capillary: 84 mg/dL (ref 70–99)
Glucose-Capillary: 111 mg/dL — ABNORMAL HIGH (ref 70–99)
Glucose-Capillary: 100 mg/dL — ABNORMAL HIGH (ref 70–99)
Glucose-Capillary: 121 mg/dL — ABNORMAL HIGH (ref 70–99)

## 2018-11-30 LAB — PLATELET COUNT: Platelets: 50 10*3/uL — ABNORMAL LOW (ref 150–400)

## 2018-11-30 MED ORDER — MORPHINE SULFATE (PF) 2 MG/ML IV SOLN: 1.0000 mg | INTRAVENOUS | Status: DC | PRN

## 2018-11-30 MED ORDER — BIOTENE DRY MOUTH MT LIQD: 15.0000 mL | OROMUCOSAL | Status: DC | PRN

## 2018-11-30 MED ORDER — LORAZEPAM 2 MG/ML IJ SOLN
0.5000 mg | INTRAMUSCULAR | Status: DC | PRN
  Administered 2018-12-01 – 2018-12-04 (×5): 1 mg via INTRAVENOUS
  Filled 2018-11-30 (×5): qty 1

## 2018-11-30 MED ORDER — GLYCOPYRROLATE 0.2 MG/ML IJ SOLN
0.3000 mg | INTRAMUSCULAR | Status: DC | PRN
  Filled 2018-11-30: qty 1.5

## 2018-11-30 MED ORDER — POLYVINYL ALCOHOL 1.4 % OP SOLN
1.0000 [drp] | Freq: Four times a day (QID) | OPHTHALMIC | Status: DC | PRN
  Filled 2018-11-30: qty 15

## 2018-11-30 MED ORDER — ARGATROBAN 50 MG/50ML IV SOLN
1.0000 ug/kg/min | INTRAVENOUS | Status: DC
  Filled 2018-11-30: qty 50

## 2018-11-30 MED ORDER — MORPHINE SULFATE (CONCENTRATE) 10 MG/0.5ML PO SOLN
5.0000 mg | ORAL | Status: DC | PRN
  Administered 2018-12-04: 17:00:00 5 mg via SUBLINGUAL
  Filled 2018-11-30: qty 0.5

## 2018-11-30 MED ORDER — ONDANSETRON HCL 4 MG/2ML IJ SOLN: 4.0000 mg | Freq: Four times a day (QID) | INTRAMUSCULAR | Status: DC | PRN

## 2018-11-30 NOTE — Progress Notes (Signed)
ANTICOAGULATION CONSULT NOTE:  Pharmacy Consult for Argatroban  Indication: pulmonary embolus and potential HIT - ordered HIT antibody.   Patient Measurements: Height: 5\' 9"  (175.3 cm) Weight: 163 lb 14.4 oz (74.3 kg) IBW/kg (Calculated) : 70.7  Use TBW for heparin dosing.  Vital Signs: Temp: 98.3 F (36.8 C) (11/27 0752) BP: 132/89 (11/27 0752) Pulse Rate: 79 (11/27 0752)  Labs: Recent Labs    11/29/18 0431 11/29/18 1437 11/29/18 2245 11/30/18 0838 11/30/18 1023  HGB 7.1*  --   --  7.8*  --   HCT 22.2*  --   --  23.9*  --   PLT 68*  --   --  49* 50*  HEPARINUNFRC 0.26* 0.34 0.38 0.44  --     Estimated Creatinine Clearance: 70 mL/min (by C-G formula based on SCr of 0.79 mg/dL).  Assessment: Pharmacy consulted to dose heparin in this 85 year old male admitted with PE.  No prior anticoagulation noted. H&H trending up, PLT WNL, baseline INR 1.2, aPTT 32s. Platelets dropped from 123 to 50, with a potential concern for HIT will switch to argatroban for the treatment of the pt's PE. Plan for a family meeting today for goals of care. Pt was stable on heparin 1200 units/hr.   Goal of Therapy:  aPTT 50-90 seconds Monitor platelets by anticoagulation protocol: Yes   Plan:  Spoke with Dr. Kurtis Bushman, will discontinue the heparin and start argatroban 1 mcg/kg/min. Ordered HIT antibody to rule out HIT. Family to have meeting for goal of care today. Ordered aPTT every 2 hours. Daily CBC.   Pharmacy will continue to follow.   Oswald Hillock, PharmD, BCPS Clinical Pharmacist 11/30/2018 12:09 PM

## 2018-11-30 NOTE — Progress Notes (Signed)
New referral for [facility] home received from [place]. Writer met in the room with patient's daughter [name] to initiate education regarding hospice services, philosophy, team approach to care and current visit ation policy with understanding voiced. Questions answered. Contact information given to [name]. [name] is aware there is currently no bed available at the hospice home. We also discussed the last COVID test on 11/10 being positive. Patient is not on COVID precautions at this time. Patient information faxed to referral. Patient is pending approval from hospice medical director. [name] and CSW Evette Cristal aware. Plan to follow up with family and hospital care team on Monday. Flo Shanks BSN, RN, [place] [phone]

## 2018-11-30 NOTE — Progress Notes (Signed)
PROGRESS NOTE    Chase Simmons  TDV:[id number] DOB: [birth year] DOA: 11/13/2018 PCP: [name], MD    Brief Narrative:  85 year old male with past medical history of Parkinson's disease with dementia, chronic systolic CHF, paroxysmal A. fib and COPD previous admission for Covid infection 1 month prior who was admitted on 11/10 with hypoxia, hypotension and altered mental status.  Patient was found to have bilateral acute pulmonary embolus.  He was admitted to the ICU or vascular surgery was consulted and patient went thrombolysis with bilateral pulmonary artery TPA.  Stabilized and transferred to a medical floor on 11/13.  Since then, patient's course noted delayed recovery with increased somnolence and very poor p.o. intake.  After discussion with family, an NG tube was attempted to be placed on 11/16.  This was unable to done after difficulties and struggling with insertion.  On morning of 11/20 has become more awake and is eating somewhat.  After discussion with family, they would like to see if this improvement continues, and then plan to take patient home.  On 11/21 pt became more somnolent . Was not really awakening. 2 children want hospice but one daughter refusing, family meeting plan on Friday with palliative care.     Consultants:   Vascular surgery  Psychiatry  Palliative care   Procedures: TPA done 11/11  Antimicrobials:   none   Subjective: Pt laying in bed, not too cooperative.  Eyes closed  Objective: Vitals:   11/29/18 1922 11/29/18 2200 11/30/18 0208 11/30/18 0752  BP: (!) 100/58 110/75 (!) 139/95 132/89  Pulse: 70  94 79  Resp: 16   19  Temp: 98.6 F (37 C)   98.3 F (36.8 C)  TempSrc: Oral     SpO2: 99%     Weight:   74.3 kg   Height:        Intake/Output Summary (Last 24 hours) at 11/30/2018 1411 Last data filed at 11/30/2018 1200 Gross per 24 hour  Intake 322.99 ml  Output 500 ml  Net -177.01 ml   Filed Weights   11/28/18 0406  11/29/18 0346 11/30/18 0208  Weight: 76.3 kg 76.2 kg 74.3 kg    Examination:  General exam: nad, not cooperative with exam Respiratory system: Clear to auscultation anteriorly with poor respiratory effort, no r/r/w Cardiovascular system: Irregular, S1 /s2.no r/m/g Gastrointestinal system: Abdomen is nondistended, soft and nontender. +bs but decreased Central nervous system: unable to assess Extremities: No edema cyanosis Skin: warm, dry     Data Reviewed: I have personally reviewed following labs and imaging studies  CBC: Recent Labs  Lab 11/24/18 0155 11/26/18 0559 11/29/18 0431 11/30/18 0838 11/30/18 1023  WBC 5.7 6.1 5.6 5.9  --   NEUTROABS  --  3.9  --   --   --   HGB 8.1* 7.6* 7.1* 7.8*  --   HCT 24.4* 22.8* 22.2* 23.9*  --   MCV 104.7* 102.7* 107.2* 107.7*  --   PLT 145* 123* 68* 49* 50*   Basic Metabolic Panel: Recent Labs  Lab 11/26/18 0559  NA 134*  K 4.0  CL 108  CO2 19*  GLUCOSE 127*  BUN 14  CREATININE 0.79  CALCIUM 7.4*   GFR: Estimated Creatinine Clearance: 70 mL/min (by C-G formula based on SCr of 0.79 mg/dL). Liver Function Tests: No results for input(s): AST, ALT, ALKPHOS, BILITOT, PROT, ALBUMIN in the last 168 hours. No results for input(s): LIPASE, AMYLASE in the last 168 hours. No results for input(s): AMMONIA  in the last 168 hours. Coagulation Profile: No results for input(s): INR, PROTIME in the last 168 hours. Cardiac Enzymes: No results for input(s): CKTOTAL, CKMB, CKMBINDEX, TROPONINI in the last 168 hours. BNP (last 3 results) No results for input(s): PROBNP in the last 8760 hours. HbA1C: No results for input(s): HGBA1C in the last 72 hours. CBG: Recent Labs  Lab 11/29/18 2351 11/30/18 0503 11/30/18 0615 11/30/18 0754 11/30/18 1201  GLUCAP 113* 84 111* 100* 121*   Lipid Profile: No results for input(s): CHOL, HDL, LDLCALC, TRIG, CHOLHDL, LDLDIRECT in the last 72 hours. Thyroid Function Tests: No results for input(s):  TSH, T4TOTAL, FREET4, T3FREE, THYROIDAB in the last 72 hours. Anemia Panel: No results for input(s): VITAMINB12, FOLATE, FERRITIN, TIBC, IRON, RETICCTPCT in the last 72 hours. Sepsis Labs: No results for input(s): PROCALCITON, LATICACIDVEN in the last 168 hours.  No results found for this or any previous visit (from the past 240 hour(s)).       Radiology Studies: No results found.      Scheduled Meds: . chlorhexidine  15 mL Mouth Rinse BID  . feeding supplement (NEPRO CARB STEADY)  237 mL Oral BID BM  . mouth rinse  15 mL Mouth Rinse q12n4p  . primidone  100 mg Oral TID  . QUEtiapine  100 mg Oral QHS  . QUEtiapine  50 mg Oral BID  . traZODone  100 mg Oral QHS   Continuous Infusions:   Assessment & Plan:   Active Problems:   HTN (hypertension)   Type 2 diabetes mellitus with complication, without long-term current use of insulin (HCC)   Tremor, essential   Goals of care, counseling/discussion   PAF (paroxysmal atrial fibrillation) (HCC)   COPD (chronic obstructive pulmonary disease) (HCC)   Chronic systolic CHF (congestive heart failure) (HCC)   Acute respiratory failure with hypoxia (HCC)   Acute massive pulmonary embolism (HCC)   Pressure injury of skin   Palliative care by specialist   Hypotension   Agitation   Dementia with behavioral disturbance ([place])   Oropharyngeal dysphagia   Advanced care planning/counseling discussion   1.Acute respiratory failure with hypoxia secondary to acute massive bilateral pulmonary embolism - Status post thrombolysis by vascular surgery.  Briefly required pressor support, now weaned off and stabilized.   Currently on IV heparin infusion.   Briefly appeared to be slowly improving and hoping to transition to oral medications in the next 24 hours.   If patient is unable to take p.o. consistently and family does not opt for comfort care, would need IVC filter. Family meeting with palliative care about pt being placed on hospice  care on friday  2.COPD: Stable.  3.Hypotension in the setting of essential hypertension:  Resolved once off of pressor support. Stable now,,started on amlodipine  4.Dementia with acute severe delirium: Briefly on Precedex on 11/11.  Psychiatry consulted.   Sedating medications held, allowing patient be more awake.  Has been aggitated some per nsg- on  low dose ativan prn   5. Oropharyngeal dysphagia: In part due to his acute delirium, but also from his worsening dementia.  Continues with poor po intake .  6.Paroxysmal A. fib: Rate controlled.  Not a candidate for anticoagulation, but will need blood thinning medication for PE as above.  7.Chronic systolic CHF: Mild decrease in EF, 45 to 50% as of 01/16/2017.  Initially slightly hypovolemic. -euvolemic without exacerbation now  8.QT prolongation: monitor ekg.   NO haldol   9.Essential tremor: Continue primidone  10.Type 2  diabetes mellitus: On gabapentin for neuropathy.  Oral hypoglycemics on hold, sliding scale only.  11.Decrease h/h- on heparin. D/c stool occult  12.Thrombocytopenia- possibly multifactorial .  Decreased p.o. intake, post Covid, and also on heparin drip.  Will check HIT and will switch to argatroban for now.  DVT prophylaxis: heparin Code Status:dnr Family Communication: none at bedside Disposition Plan: Palliative care and I met with family today about placing patient on comfort measures, sending him to home with hospice or hospice house.  2 of the children are in agreement with hospice house and comfort measures.  Discussed above assessment and plans and patient's current clinical status and decreased p.o. intake.  After much discussion they are in agreement with hospice health and for now we will place him on comfort measures and will discontinue all medications including his anticoagulation for and insulin.  We will also discontinue all labs and fingersticks.  Palliative care will place patient on  morphine and Ativan as needed for comfort.  No Haldol should be given.        LOS: 17 days   Time spent: 60 minutes with more than 50% on [place], MD [facility] Pager [phone]  If 7PM-7AM, please contact night-coverage [email or web address] Password [facility] 11/30/2018, 2:11 PM Patient ID: Chase Simmons, male   DOB: 09-Oct-1933, 85 y.o.   MRN: [id number] Patient ID: Chase Simmons, male   DOB: September 12, 1933, 85 y.o.   MRN: [id number]

## 2018-11-30 NOTE — Care Management Important Message (Signed)
Important Message  Patient Details  Name: Chase Simmons MRN: [id number] Date of Birth: 1933-01-29   Medicare Important Message Given:  Yes     Dannette Barbara 11/30/2018, 2:02 PM

## 2018-11-30 NOTE — Progress Notes (Addendum)
ANTICOAGULATION CONSULT NOTE:  Pharmacy Consult for Heparin  Indication: pulmonary embolus  Patient Measurements: Height: 5\' 9"  (175.3 cm) Weight: 163 lb 14.4 oz (74.3 kg) IBW/kg (Calculated) : 70.7  Use TBW for heparin dosing.  Vital Signs: Temp: 98.3 F (36.8 C) (11/27 0752) BP: 132/89 (11/27 0752) Pulse Rate: 79 (11/27 0752)  Labs: Recent Labs    11/29/18 0431 11/29/18 1437 11/29/18 2245 11/30/18 0838  HGB 7.1*  --   --  7.8*  HCT 22.2*  --   --  23.9*  PLT 68*  --   --  49*  HEPARINUNFRC 0.26* 0.34 0.38 0.44    Estimated Creatinine Clearance: 70 mL/min (by C-G formula based on SCr of 0.79 mg/dL).  Assessment: Pharmacy consulted to dose heparin in this 85 year old male admitted with PE.  No prior anticoagulation noted. H&H trending up, PLT WNL, baseline INR 1.2, aPTT 32s.  Heparin Course: 11/26: HL @ 0431 = 0.26, subtherapeutic just below goal.  Increase to 1300 units/hr and recheck in 8hrs 11/26: HL @ 1437 = 0.34  11/26: HL @ 2245 = 0.38  11/27: HL @ 0838 = 0.44   Goal of Therapy:  Heparin level 0.3-0.7 units/ml Monitor platelets by anticoagulation protocol: Yes   Plan:  Heparin level is therapeutic. Will continue current rate and will recheck HL/CBC daily per protocol. Plt dropping. 123 > 68 > 49.   Addendum: Spoke with Dr. Kurtis Bushman, will hold heparin and repeat platelet lab. Ordered HIT antibody to rule out HIT. Family to have meeting for goal of care today.   Pharmacy will continue to follow.   Oswald Hillock, PharmD, BCPS Clinical Pharmacist 11/30/2018 9:21 AM

## 2018-11-30 NOTE — Progress Notes (Signed)
PALLIATIVE NOTE:  Patient remains somewhat somnolent. Confused with history of dementia. He will not follow commands. Appetite remains poor with intermittent sips and bites.   At family's request follow [place] meeting with Zadie Rhine, and [name]. Dr. Kurtis Bushman also present during meeting. Detailed updates provided to family by both myself and Dr. Kurtis Bushman. Patient's daughter, [name] upset with other siblings and continuing to express her differences in opinions to her siblings. She remains hopeful patient will improve comparing repeatedly how patient was months ago to now. Therapeutic listening provided with current updates and focus redirected on patient's overall condition and health at the current moment and with consideration to his health concerns and course over the past few weeks while hospitalized.   Several moments noted and de-escalated bringing focus back to patient's best interest and care, as siblings expressed differences in opinions with [name] sharing her unpleasant experiences with hospice during the death of her significant other. Support given.   Current illness, co-morbidities and trajectory discussed in detail. Family verbalized understanding. Both [name] and [name] verbalized their thoughts of their father's drastic decline, poor prognosis, and suffering. They expressed he would not want this and would not want to live in such a poor state of health with no quality of life. They both again expressed wishes to allow patient to be comfortable, and have a natural and peaceful end-of-life process. Family requesting patient not to receive Haldol with previous noted increase in agitation and shaking. Support given and they are aware this will be listed as an intolerance. Family also requesting to continue with Seroquel for behavior control if patient able to take.   Education provided on full comfort versus aggressive medical interventions as well as options for care at home, [facility] facility, and  hospice home. 72 of patient's children made mutual decision for patient to transition to full comfort care with consideration of hospice home placement. Education provided on what comfort care will look like for patient, with family awareness that all care will focus on comfort and symptom management. All aggressive care and interventions will be discontinued such as lab work, radiology testing, medications not focused on comfort, and frequent vital signs. Family educated on process of hospice referral, approval, and transfer process. Family verbalized understanding.    Assessment -chronically ill-appearing, NAD -diminished bilaterally with poor effort -IRR -confused, somnolent, will not follow commands  Plan -DNR/DNI -Will transition to full comfort as requested and confirmed by family -hospice home referral  -Attending to d/c all orders not comfort focused -Morphine PRN for pain/air hunger -Ativan PRN for agitation/anxiety -Robinul PRN for secretions -Liquifilm tears PRN for dry eyes -Patient may continue with comfort feeds with awareness of risk of aspiration -Oral care as needed -Zofran Prn for nausea -Will continue with Seroquel as requested by family for comfort and behavior control. With awareness if patient unable or refusing to take will not push to be given.  -PMT will continue to support and follow. No weekend coverage  Time In: 1300 Time Out: 1400 Total Time: 60 min.   Greater than 50%  of this time was spent counseling and coordinating care related to the above assessment and plan.  Alda Lea, AGPCNP-BC Palliative Medicine Team

## 2018-11-30 NOTE — TOC Progression Note (Addendum)
Transition of Care [facility]) - Progression Note    Patient Details  Name: Chase Simmons MRN: [id number] Date of Birth: August 18, 1933  Transition of [facility] CM/SW Contact  Ross Ludwig, [place] Phone Number: 11/30/2018, 12:20 PM  Clinical Narrative:     CSW spoke to patient's daughter [name] she had questions in regards to visitation if patient returns to [facility] with hospice services.  CSW contacted [facility] and spoke to [place], she said that if patient is end of life and receiving hospice at [facility], the administrator will work out a visitation program for the family.  CSW updated palliative care [facility] as well, of what [facility] said.  CSW to talk with family after palliative meeting.  2:00pm  CSW was updated that patient's family have chosen hospice facility for end of life care at [facility], [place] [facility] is aware of referral, no beds available.  Expected Discharge Plan: [place] Barriers to Discharge: Continued Medical Work up  Expected Discharge Plan and Services Expected Discharge Plan: [place] In-house Referral: Hospice / [place], Clinical Social Work     Living arrangements for the past 2 months: [place]                                       Social Determinants of Health (SDOH) Interventions    Readmission Risk Interventions Readmission Risk Prevention Plan 11/16/2018 06/07/2018  Transportation Screening Complete Complete  PCP or Specialist Appt within 3-5 Days - Complete  HRI or [place] - Complete  Social Work Consult for [place] Planning/Counseling - Not Complete  Medication Review Press photographer) - Complete  PCP or Specialist appointment within 3-5 days of discharge Complete -  [place] or [place] Complete -  SW Recovery Care/Counseling Consult Complete -  Palliative Care Screening Complete -  [place] Not Applicable -  Some recent data might be hidden

## 2018-11-30 NOTE — Progress Notes (Addendum)
[name] daughter called and asked the nurse if pt still on primidone and Paxil added she spoke to the doctor that they want the pt to continue on this medication. The nurse explained to the daughter that since pt on comfort care now that medication are stopped. The daughter added that she would like to talked to the doctor in the morning.Will notify the incoming shift tomorrow. Will continue to monitor.

## 2018-12-01 DIAGNOSIS — I10 Essential (primary) hypertension: Secondary | ICD-10-CM

## 2018-12-01 LAB — GLUCOSE, CAPILLARY: Glucose-Capillary: 102 mg/dL — ABNORMAL HIGH (ref 70–99)

## 2018-12-01 LAB — HEPARIN INDUCED PLATELET AB (HIT ANTIBODY): Heparin Induced Plt Ab: 0.341 OD (ref 0.000–0.400)

## 2018-12-01 NOTE — Progress Notes (Signed)
PROGRESS NOTE    TOLAN MASSINGALE  [id number] DOB: 07/02/1933 DOA: 11/13/2018 PCP: [name], MD    Brief Narrative:  85 year old male with past medical history of Parkinson's disease with dementia, chronic systolic CHF, paroxysmal A. fib and COPD previous admission for Covid infection 1 month prior who was admitted on 11/10 with hypoxia, hypotension and altered mental status.  Patient was found to have bilateral acute pulmonary embolus.  He was admitted to the ICU or vascular surgery was consulted and patient went thrombolysis with bilateral pulmonary artery TPA.  Stabilized and transferred to a medical floor on 11/13.  Since then, patient's course noted delayed recovery with increased somnolence and very poor p.o. intake.  After discussion with family, an NG tube was attempted to be placed on 11/16.  This was unable to done after difficulties and struggling with insertion.  On morning of 11/20 has become more awake and is eating somewhat.  After discussion with family, they would like to see if this improvement continues, and then plan to take patient home.  On 11/21 pt became more somnolent .  Had a family meeting on 11/30/2018 and was decided patient to be trans for 2 comfort measures and be discharged to hospice house next week.   Consultants:   Vascular surgery  Psychiatry  Palliative care   Procedures: TPA done 11/11  Antimicrobials:   none   Subjective: Pt laying in bed, not too cooperative. Quite eyes open.   Objective: Vitals:   11/30/18 0208 11/30/18 0752 12/01/18 0500 12/01/18 0822  BP: (!) 139/95 132/89 119/69 117/75  Pulse: 94 79 86 (!) 206  Resp:  19 18 18   Temp:  98.3 F (36.8 C) 98.2 F (36.8 C) 97.9 F (36.6 C)  TempSrc:   Oral   SpO2:   100% 98%  Weight: 74.3 kg     Height:        Intake/Output Summary (Last 24 hours) at 12/01/2018 1114 Last data filed at 11/30/2018 1200 Gross per 24 hour  Intake -  Output 100 ml  Net -100 ml    Filed Weights   11/28/18 0406 11/29/18 0346 11/30/18 0208  Weight: 76.3 kg 76.2 kg 74.3 kg    Examination:  General exam:Not  cooperative with exam or answering questions, somnlonent Respiratory system: CTA  anteriorly with poor respiratory effort, no r/r/w Cardiovascular system: Irregular, S1 /s2. No r/m/g Gastrointestinal system:soft, nt/nd, +bs Central nervous system: unable to assess Extremities: No edema cyanosis Skin: warm, dry     Data Reviewed: I have personally reviewed following labs and imaging studies  CBC: Recent Labs  Lab 11/26/18 0559 11/29/18 0431 11/30/18 0838 11/30/18 1023  WBC 6.1 5.6 5.9  --   NEUTROABS 3.9  --   --   --   HGB 7.6* 7.1* 7.8*  --   HCT 22.8* 22.2* 23.9*  --   MCV 102.7* 107.2* 107.7*  --   PLT 123* 68* 49* 50*   Basic Metabolic Panel: Recent Labs  Lab 11/26/18 0559  NA 134*  K 4.0  CL 108  CO2 19*  GLUCOSE 127*  BUN 14  CREATININE 0.79  CALCIUM 7.4*   GFR: Estimated Creatinine Clearance: 70 mL/min (by C-G formula based on SCr of 0.79 mg/dL). Liver Function Tests: No results for input(s): AST, ALT, ALKPHOS, BILITOT, PROT, ALBUMIN in the last 168 hours. No results for input(s): LIPASE, AMYLASE in the last 168 hours. No results for input(s): AMMONIA in the last 168 hours. Coagulation Profile:  No results for input(s): INR, PROTIME in the last 168 hours. Cardiac Enzymes: No results for input(s): CKTOTAL, CKMB, CKMBINDEX, TROPONINI in the last 168 hours. BNP (last 3 results) No results for input(s): PROBNP in the last 8760 hours. HbA1C: No results for input(s): HGBA1C in the last 72 hours. CBG: Recent Labs  Lab 11/30/18 0503 11/30/18 0615 11/30/18 0754 11/30/18 1201 12/01/18 0820  GLUCAP 84 111* 100* 121* 102*   Lipid Profile: No results for input(s): CHOL, HDL, LDLCALC, TRIG, CHOLHDL, LDLDIRECT in the last 72 hours. Thyroid Function Tests: No results for input(s): TSH, T4TOTAL, FREET4, T3FREE, THYROIDAB in the  last 72 hours. Anemia Panel: No results for input(s): VITAMINB12, FOLATE, FERRITIN, TIBC, IRON, RETICCTPCT in the last 72 hours. Sepsis Labs: No results for input(s): PROCALCITON, LATICACIDVEN in the last 168 hours.  No results found for this or any previous visit (from the past 240 hour(s)).       Radiology Studies: No results found.      Scheduled Meds: . mouth rinse  15 mL Mouth Rinse q12n4p  . QUEtiapine  100 mg Oral QHS  . QUEtiapine  50 mg Oral BID  . traZODone  100 mg Oral QHS   Continuous Infusions:   Assessment & Plan:   Active Problems:   HTN (hypertension)   Type 2 diabetes mellitus with complication, without long-term current use of insulin (HCC)   Tremor, essential   Goals of care, counseling/discussion   PAF (paroxysmal atrial fibrillation) (HCC)   COPD (chronic obstructive pulmonary disease) (HCC)   Chronic systolic CHF (congestive heart failure) (HCC)   Acute respiratory failure with hypoxia (HCC)   Acute massive pulmonary embolism (HCC)   Pressure injury of skin   Palliative care by specialist   Hypotension   Agitation   Dementia with behavioral disturbance ([place])   Oropharyngeal dysphagia   Advanced care planning/counseling discussion   1.Acute respiratory failure with hypoxia secondary to acute massive bilateral pulmonary embolism - Status post thrombolysis by vascular surgery.  Briefly required pressor support, now weaned off and stabilized.   Was on IV heparin infusion. -Discontinued as patient is on comfort measures and family is agreeable to stopping this and also patient was found with thrombocytopenia   2.COPD: Stable.  3.Hypotension in the setting of essential hypertension:  Resolved once off of pressor support. Now on comfort measurs.  4.Dementia with acute severe delirium: Briefly on Precedex on 11/11.  Psychiatry consulted.   Sedating medications held, allowing patient be more awake.  Has been aggitated some per nsg- on  low  dose ativan prn   5. Oropharyngeal dysphagia: In part due to his acute delirium, but also from his worsening dementia.  Continues with poor po intake .  6.Paroxysmal A. fib: Rate controlled.  Not a candidate for anticoagulation On comfort measures.  Discussed a/c with family and will stop medical medications including a/c.  7.Chronic systolic CHF: Mild decrease in EF, 45 to 50% as of 01/16/2017. -euvolemic without exacerbation now  8.QT prolongation: monitor ekg.   NO haldol- also causes pt to shake and family does not want this to be given   9.Essential tremor: Continue primidone  10.Type 2 diabetes mellitus: On gabapentin for neuropathy.  Oral hypoglycemics on hold, sliding scale only.   11.Decrease h/h- on heparin. D/c stool occult  12.Thrombocytopenia- possibly multifactorial .  Decreased p.o. intake, post Covid, and also on heparin drip.   No further blood draws or management.  Heparin drip and gastric band were discontinued yesterday  per discussion with family as patient is on comfort measures and will be transferred to home hospice.  DVT prophylaxis: heparin Code Status:dnr Family Communication: none at bedside Disposition Plan:d/c to [facility] home next week when bed available.  Continue comfort measures     LOS: 18 days   Time spent: 35 minutes with more than 50% on [place], MD [facility] Pager [phone]  If 7PM-7AM, please contact night-coverage [email or web address] Password [email or web address] 12/01/2018, 11:14 AM Patient ID: KENJIRO TYLICKI, male   DOB: [birth year], 85 y.o.   MRN: [id number] Patient ID: EDGARD BROOMFIELD, male   DOB: 02-13-1933, 85 y.o.   MRN: [id number] Patient ID: RONIK MACGILLIVRAY, male   DOB: 06-Jun-1933, 85 y.o.   MRN: [id number]

## 2018-12-01 NOTE — Plan of Care (Signed)
  Problem: Safety: Goal: Ability to remain free from injury will improve Outcome: Progressing   

## 2018-12-02 NOTE — Plan of Care (Signed)
  Problem: Coping: Goal: Level of anxiety will decrease Outcome: Progressing   Problem: Safety: Goal: Ability to remain free from injury will improve Outcome: Progressing   Problem: Skin Integrity: Goal: Risk for impaired skin integrity will decrease Outcome: Progressing   

## 2018-12-02 NOTE — Progress Notes (Signed)
PROGRESS NOTE    Chase Simmons  [id number] DOB: 1933-12-14 DOA: 11/13/2018 PCP: [name], MD    Brief Narrative:  85 year old male with past medical history of Parkinson's disease with dementia, chronic systolic CHF, paroxysmal A. fib and COPD previous admission for Covid infection 1 month prior who was admitted on 11/10 with hypoxia, hypotension and altered mental status.  Patient was found to have bilateral acute pulmonary embolus.  He was admitted to the ICU or vascular surgery was consulted and patient went thrombolysis with bilateral pulmonary artery TPA.  Stabilized and transferred to a medical floor on 11/13.  Since then, patient's course noted delayed recovery with increased somnolence and very poor p.o. intake.  After discussion with family, an NG tube was attempted to be placed on 11/16.  This was unable to done after difficulties and struggling with insertion.  On morning of 11/20 has become more awake and is eating somewhat.  After discussion with family, they would like to see if this improvement continues, and then plan to take patient home.  On 11/21 pt became more somnolent .  Had a family meeting on 11/30/2018 and was decided patient to be trans for 2 comfort measures and be discharged to hospice house next week.   Consultants:   Vascular surgery  Psychiatry  Palliative care   Procedures: TPA done 11/11  Antimicrobials:   none   Subjective: Pt laying in bed, eyes closed ,confused, not cooperative, daughter at bedside.  She has no questions except that she wishes her father would get better although he is on comfort care sugars  Objective: Vitals:   12/01/18 0500 12/01/18 0822 12/01/18 2030 12/02/18 0509  BP: 119/69 117/75 123/88 126/86  Pulse: 86 61 (!) 101 96  Resp: 18 18 16 16   Temp: 98.2 F (36.8 C) 97.9 F (36.6 C) 97.9 F (36.6 C) 99.2 F (37.3 C)  TempSrc: Oral  Oral Oral  SpO2: 100% 98% 100% 94%  Weight:    75.1 kg  Height:        Intake/Output Summary (Last 24 hours) at 12/02/2018 1335 Last data filed at 12/02/2018 0052 Gross per 24 hour  Intake -  Output 400 ml  Net -400 ml   Filed Weights   11/29/18 0346 11/30/18 0208 12/02/18 0509  Weight: 76.2 kg 74.3 kg 75.1 kg    Examination:  General exam: Laying in bed, NAD eyes closed not cooperative with exam daughter at bedside Respiratory system: CTA  anteriorly with poor respiratory effort, no r/r/w Cardiovascular system: Irregular, S1 /s2. No r/m/g Gastrointestinal system:soft, nt/nd, +bs Central nervous system: unable to assess Extremities: No edema cyanosis. LUE mild edema. Skin: warm, dry     Data Reviewed: I have personally reviewed following labs and imaging studies  CBC: Recent Labs  Lab 11/26/18 0559 11/29/18 0431 11/30/18 0838 11/30/18 1023  WBC 6.1 5.6 5.9  --   NEUTROABS 3.9  --   --   --   HGB 7.6* 7.1* 7.8*  --   HCT 22.8* 22.2* 23.9*  --   MCV 102.7* 107.2* 107.7*  --   PLT 123* 68* 49* 50*   Basic Metabolic Panel: Recent Labs  Lab 11/26/18 0559  NA 134*  K 4.0  CL 108  CO2 19*  GLUCOSE 127*  BUN 14  CREATININE 0.79  CALCIUM 7.4*   GFR: Estimated Creatinine Clearance: 70 mL/min (by C-G formula based on SCr of 0.79 mg/dL). Liver Function Tests: No results for input(s): AST, ALT, ALKPHOS,  BILITOT, PROT, ALBUMIN in the last 168 hours. No results for input(s): LIPASE, AMYLASE in the last 168 hours. No results for input(s): AMMONIA in the last 168 hours. Coagulation Profile: No results for input(s): INR, PROTIME in the last 168 hours. Cardiac Enzymes: No results for input(s): CKTOTAL, CKMB, CKMBINDEX, TROPONINI in the last 168 hours. BNP (last 3 results) No results for input(s): PROBNP in the last 8760 hours. HbA1C: No results for input(s): HGBA1C in the last 72 hours. CBG: Recent Labs  Lab 11/30/18 0503 11/30/18 0615 11/30/18 0754 11/30/18 1201 12/01/18 0820  GLUCAP 84 111* 100* 121* 102*   Lipid Profile:  No results for input(s): CHOL, HDL, LDLCALC, TRIG, CHOLHDL, LDLDIRECT in the last 72 hours. Thyroid Function Tests: No results for input(s): TSH, T4TOTAL, FREET4, T3FREE, THYROIDAB in the last 72 hours. Anemia Panel: No results for input(s): VITAMINB12, FOLATE, FERRITIN, TIBC, IRON, RETICCTPCT in the last 72 hours. Sepsis Labs: No results for input(s): PROCALCITON, LATICACIDVEN in the last 168 hours.  No results found for this or any previous visit (from the past 240 hour(s)).       Radiology Studies: No results found.      Scheduled Meds: . mouth rinse  15 mL Mouth Rinse q12n4p  . QUEtiapine  100 mg Oral QHS  . QUEtiapine  50 mg Oral BID  . traZODone  100 mg Oral QHS   Continuous Infusions:   Assessment & Plan:   Active Problems:   HTN (hypertension)   Type 2 diabetes mellitus with complication, without long-term current use of insulin (HCC)   Tremor, essential   Goals of care, counseling/discussion   PAF (paroxysmal atrial fibrillation) (HCC)   COPD (chronic obstructive pulmonary disease) (HCC)   Chronic systolic CHF (congestive heart failure) (HCC)   Acute respiratory failure with hypoxia (HCC)   Acute massive pulmonary embolism (HCC)   Pressure injury of skin   Palliative care by specialist   Hypotension   Agitation   Dementia with behavioral disturbance ([place])   Oropharyngeal dysphagia   Advanced care planning/counseling discussion   1.Acute respiratory failure with hypoxia secondary to acute massive bilateral pulmonary embolism - Status post thrombolysis by vascular surgery.  Briefly required pressor support, now weaned off and stabilized.   Was on IV heparin infusion. -Discontinued as patient is on comfort measures and family is agreeable to stopping this and also patient was found with thrombocytopenia   2.COPD: Stable.  3.Hypotension in the setting of essential hypertension:  Resolved once off of pressor support. Now on comfort measurs.   4.Dementia with acute severe delirium: Briefly on Precedex on 11/11.  Psychiatry consulted.   Sedating medications held, allowing patient be more awake.  Has been aggitated some per nsg- on  low dose ativan prn   5. Oropharyngeal dysphagia: In part due to his acute delirium, but also from his worsening dementia.  Continues with poor po intake .  6.Paroxysmal A. fib: Rate controlled.  Not a candidate for anticoagulation On comfort measures.  Discussed a/c with family and will stop medical medications including a/c.  7.Chronic systolic CHF: Mild decrease in EF, 45 to 50% as of 01/16/2017. -euvolemic without exacerbation now  8.QT prolongation: monitor ekg.   NO haldol- also causes pt to shake and family does not want this to be given   9.Essential tremor: Continue primidone  10.Type 2 diabetes mellitus: On gabapentin for neuropathy.  Oral hypoglycemics on hold, sliding scale only.   11.Decrease h/h- on heparin. D/c stool occult  12.Thrombocytopenia-  possibly multifactorial .  Decreased p.o. intake, post Covid, and also on heparin drip.   No further blood draws or management.  Heparin drip and gastric band were discontinued yesterday per discussion with family as patient is on comfort measures and will be transferred to home hospice.  DVT prophylaxis: heparin Code Status:dnr Family Communication: none at bedside Disposition Plan:d/c to [facility] home next week when bed available.  Continue comfort measures     LOS: 19 days   Time spent: 35 minutes with more than 50% on [place], MD [facility] Pager [phone]  If 7PM-7AM, please contact night-coverage [email or web address] Password [email or web address] 12/02/2018, 1:35 PM Patient ID: Chase Simmons, male   DOB: 07-May-1933, 85 y.o.   MRN: [id number]

## 2018-12-03 NOTE — Care Management Important Message (Signed)
Important Message  Patient Details  Name: Chase Simmons MRN: [id number] Date of Birth: 20-Mar-1933   Medicare Important Message Given:  Yes     [name] A [name] 12/03/2018, 2:39 PM

## 2018-12-03 NOTE — Progress Notes (Signed)
Follow up visit made to new referral for [facility] hosp[facility] home. Patient seen lying in bed, alert, but appeared restless and anxious. Daughter [name] at bedside. Patient was able to eat a few bites of lunch today. [name] reports he also ate some magic cup at breakfast. [name] has some follow up questions since last visit with writer on Friday. Questions answered, emotional support given. Patient continued to be restless through out the visit, staff RN Ashly notified at [facility] request for PRN lorazepam to be given. Plan is for transfer to the hospice home tomorrow via EMS, with signed out of facility DNR in place. Writer to arrange transport and call report. CSW Evette Cristal made aware. Will continue to follow through discharge. Flo Shanks BSN, RN, [place] [phone]

## 2018-12-03 NOTE — Plan of Care (Signed)
  Problem: Education: Goal: Knowledge of General Education information will improve Description: Including pain rating scale, medication(s)/side effects and non-pharmacologic comfort measures Outcome: Progressing   Problem: Clinical Measurements: Goal: Will remain free from infection Outcome: Progressing   Problem: Coping: Goal: Level of anxiety will decrease Outcome: Progressing   

## 2018-12-03 NOTE — Progress Notes (Signed)
PROGRESS NOTE    Chase Simmons  [id number] DOB: Mar 07, 1933 DOA: 11/13/2018 PCP: [name], MD    Brief Narrative:  85 year old male with past medical history of Parkinson's disease with dementia, chronic systolic CHF, paroxysmal A. fib and COPD previous admission for Covid infection 1 month prior who was admitted on 11/10 with hypoxia, hypotension and altered mental status.  Patient was found to have bilateral acute pulmonary embolus.  He was admitted to the ICU or vascular surgery was consulted and patient went thrombolysis with bilateral pulmonary artery TPA.  Stabilized and transferred to a medical floor on 11/13.  Since then, patient's course noted delayed recovery with increased somnolence and very poor p.o. intake.  After discussion with family, an NG tube was attempted to be placed on 11/16.  This was unable to done after difficulties and struggling with insertion.  On morning of 11/20 has become more awake and is eating somewhat.  After discussion with family, they would like to see if this improvement continues, and then plan to take patient home.  On 11/21 pt became more somnolent .  Had a family meeting on 11/30/2018 and was decided patient to be trans for 2 comfort measures and be discharged to hospice house next week.   Consultants:   Vascular surgery  Psychiatry  Palliative care   Procedures: TPA done 11/11  Antimicrobials:   none   Subjective: Pt laying in bed, eyes closed ,confused, not cooperative, daughter at bedside.  She has no questions except that she wishes her father would get better although he is on comfort care sugars  Objective: Vitals:   12/02/18 0509 12/02/18 1545 12/03/18 0431 12/03/18 0744  BP: 126/86 (!) 141/115 124/76 114/74  Pulse: 96 (!) 40 (!) 114 96  Resp: 16 19 15 18   Temp: 99.2 F (37.3 C) 98.9 F (37.2 C) 99.9 F (37.7 C) 98.8 F (37.1 C)  TempSrc: Oral Oral Oral Axillary  SpO2: 94% 97% (!) 81% (!) 89%  Weight: 75.1  kg     Height:        Intake/Output Summary (Last 24 hours) at 12/03/2018 1322 Last data filed at 12/03/2018 0436 Gross per 24 hour  Intake -  Output 550 ml  Net -550 ml   Filed Weights   11/29/18 0346 11/30/18 0208 12/02/18 0509  Weight: 76.2 kg 74.3 kg 75.1 kg    Examination:  General exam: Laying in bed, confused, mummbling, noncooperative with  exam d Respiratory system: CTA  anteriorly with poor respiratory effort, no r/r/w Cardiovascular system: Irregular, S1 /s2. No r/m/g Gastrointestinal system:soft, nt/nd, +bs Central nervous system: unable to assess Extremities: No edema cyanosis. LUE mild edema. Skin: warm, dry     Data Reviewed: I have personally reviewed following labs and imaging studies  CBC: Recent Labs  Lab 11/29/18 0431 11/30/18 0838 11/30/18 1023  WBC 5.6 5.9  --   HGB 7.1* 7.8*  --   HCT 22.2* 23.9*  --   MCV 107.2* 107.7*  --   PLT 68* 49* 50*   Basic Metabolic Panel: No results for input(s): NA, K, CL, CO2, GLUCOSE, BUN, CREATININE, CALCIUM, MG, PHOS in the last 168 hours. GFR: Estimated Creatinine Clearance: 70 mL/min (by C-G formula based on SCr of 0.79 mg/dL). Liver Function Tests: No results for input(s): AST, ALT, ALKPHOS, BILITOT, PROT, ALBUMIN in the last 168 hours. No results for input(s): LIPASE, AMYLASE in the last 168 hours. No results for input(s): AMMONIA in the last 168 hours. Coagulation  Profile: No results for input(s): INR, PROTIME in the last 168 hours. Cardiac Enzymes: No results for input(s): CKTOTAL, CKMB, CKMBINDEX, TROPONINI in the last 168 hours. BNP (last 3 results) No results for input(s): PROBNP in the last 8760 hours. HbA1C: No results for input(s): HGBA1C in the last 72 hours. CBG: Recent Labs  Lab 11/30/18 0503 11/30/18 0615 11/30/18 0754 11/30/18 1201 12/01/18 0820  GLUCAP 84 111* 100* 121* 102*   Lipid Profile: No results for input(s): CHOL, HDL, LDLCALC, TRIG, CHOLHDL, LDLDIRECT in the last 72  hours. Thyroid Function Tests: No results for input(s): TSH, T4TOTAL, FREET4, T3FREE, THYROIDAB in the last 72 hours. Anemia Panel: No results for input(s): VITAMINB12, FOLATE, FERRITIN, TIBC, IRON, RETICCTPCT in the last 72 hours. Sepsis Labs: No results for input(s): PROCALCITON, LATICACIDVEN in the last 168 hours.  No results found for this or any previous visit (from the past 240 hour(s)).       Radiology Studies: No results found.      Scheduled Meds: . mouth rinse  15 mL Mouth Rinse q12n4p  . QUEtiapine  100 mg Oral QHS  . QUEtiapine  50 mg Oral BID  . traZODone  100 mg Oral QHS   Continuous Infusions:   Assessment & Plan:   Active Problems:   HTN (hypertension)   Type 2 diabetes mellitus with complication, without long-term current use of insulin (HCC)   Tremor, essential   Goals of care, counseling/discussion   PAF (paroxysmal atrial fibrillation) (HCC)   COPD (chronic obstructive pulmonary disease) (HCC)   Chronic systolic CHF (congestive heart failure) (HCC)   Acute respiratory failure with hypoxia (HCC)   Acute massive pulmonary embolism (HCC)   Pressure injury of skin   Palliative care by specialist   Hypotension   Agitation   Dementia with behavioral disturbance ([place])   Oropharyngeal dysphagia   Advanced care planning/counseling discussion   1.Acute respiratory failure with hypoxia secondary to acute massive bilateral pulmonary embolism - Status post thrombolysis by vascular surgery.  Briefly required pressor support, now weaned off and stabilized.   Was on IV heparin infusion. -Discontinued as patient is on comfort measures and family is agreeable to stopping this and also patient was found with thrombocytopenia   2.COPD: Stable.  3.Hypotension in the setting of essential hypertension:  Resolved once off of pressor support. Now on comfort measurs.  4.Dementia with acute severe delirium: Briefly on Precedex on 11/11.  Psychiatry consulted.    Sedating medications held, allowing patient be more awake.  Has been aggitated some per nsg- on  low dose ativan prn   5. Oropharyngeal dysphagia: In part due to his acute delirium, but also from his worsening dementia.  Continues with poor po intake .  6.Paroxysmal A. fib: Rate controlled.  Not a candidate for anticoagulation On comfort measures.  Discussed a/c with family and will stop medical medications including a/c.  7.Chronic systolic CHF: Mild decrease in EF, 45 to 50% as of 01/16/2017. -euvolemic without exacerbation now  8.QT prolongation: monitor ekg.   NO haldol- also causes pt to shake and family does not want this to be given   9.Essential tremor: Continue primidone  10.Type 2 diabetes mellitus: On gabapentin for neuropathy.  Oral hypoglycemics on hold, sliding scale only.   11.Decrease h/h- on heparin. D/c stool occult  12.Thrombocytopenia- possibly multifactorial .  Decreased p.o. intake, post Covid, and also on heparin drip.   No further blood draws or management.  Heparin drip and gastric band were discontinued  yesterday per discussion with family as patient is on comfort measures and will be transferred to home hospice.  DVT prophylaxis: heparin Code Status:dnr Family Communication: none at bedside Disposition Plan:d/c to [employer] home in am when bed avaible    LOS: 20 days   Time spent: 35 minutes with more than 50% on [place], MD [facility] Pager [phone]  If 7PM-7AM, please contact night-coverage [email or web address] Password [email or web address] 12/03/2018, 1:22 PM Patient ID: Chase Simmons, male   DOB: [birth year], 85 y.o.   MRN: [id number]

## 2018-12-04 MED ORDER — BIOTENE DRY MOUTH MT LIQD: 15.0000 mL | OROMUCOSAL | Status: AC | PRN

## 2018-12-04 MED ORDER — LORAZEPAM 2 MG/ML IJ SOLN: 0.5000 mg | INTRAMUSCULAR | 0 refills | Status: AC | PRN

## 2018-12-04 MED ORDER — POLYETHYLENE GLYCOL 3350 17 G PO PACK: 17.0000 g | PACK | Freq: Every day | ORAL | 0 refills | Status: AC | PRN

## 2018-12-04 MED ORDER — POLYVINYL ALCOHOL 1.4 % OP SOLN: 1.0000 [drp] | Freq: Four times a day (QID) | OPHTHALMIC | 0 refills | Status: AC | PRN

## 2018-12-04 MED ORDER — ONDANSETRON HCL 4 MG/2ML IJ SOLN: 4.0000 mg | Freq: Four times a day (QID) | INTRAMUSCULAR | 0 refills | Status: AC | PRN

## 2018-12-04 MED ORDER — LORAZEPAM 2 MG/ML IJ SOLN: 0.5000 mg | INTRAMUSCULAR | Status: DC

## 2018-12-04 MED ORDER — ORAL CARE MOUTH RINSE: 15.0000 mL | Freq: Two times a day (BID) | OROMUCOSAL | 0 refills | Status: AC

## 2018-12-04 MED ORDER — MORPHINE SULFATE (CONCENTRATE) 10 MG/0.5ML PO SOLN: 5.0000 mg | ORAL | Status: AC | PRN

## 2018-12-04 MED ORDER — SALINE SPRAY 0.65 % NA SOLN: 1.0000 | NASAL | 0 refills | Status: AC | PRN

## 2018-12-04 MED ORDER — GLYCOPYRROLATE 0.2 MG/ML IJ SOLN: 0.3000 mg | INTRAMUSCULAR | 0 refills | Status: AC | PRN

## 2018-12-04 MED ORDER — MORPHINE SULFATE (PF) 2 MG/ML IV SOLN: 1.0000 mg | INTRAVENOUS | 0 refills | Status: AC | PRN

## 2018-12-04 NOTE — Discharge Summary (Signed)
Chase Simmons [id number] DOB: 14-Sep-1933 DOA: 11/13/2018  PCP: [name], MD  Admit date: 11/13/2018 Discharge date: 12/04/2018  Admitted From: Home Disposition:  Hospice House  Recommendations for Outpatient Follow-up:  none  Home Health:hospice house    Discharge Condition:Stable CODE STATUS:DNR  Diet recommendation: Nectar thick Brief/Interim Summary: 85 year old male with past medical history of Parkinson's disease with dementia, chronic systolic CHF, paroxysmal A. fib and COPD previous admission for Covid infection 1 month prior who was admitted on 11/10 with hypoxia, hypotension and altered mental status. Patient was found to have bilateral acute pulmonary embolus. He was admitted to the ICU or vascular surgery was consulted and patient went thrombolysis with bilateral pulmonary artery TPA. Stabilized and transferred to a medical floor on 11/13. Since then, patient's course noted delayed recovery with increased somnolence and very poor p.o. intake. After discussion with family, an NG tube was attempted to be placed on 11/16. This was unable to done after difficulties and struggling with insertion. Onmorning of 11/20 has become more awake and is eating somewhat. After discussion with family, they would like to see if this improvement continues, and then plan to take patient home. On 11/21 pt became more somnolent .  Had a family meeting on 11/30/2018 and was decided patient to be trans for 2 comfort measures and be discharged to hospice house .  Patient has been discharged to home hospice.  Discharge Diagnoses:  Active Problems:   HTN (hypertension)   Type 2 diabetes mellitus with complication, without long-term current use of insulin (HCC)   Tremor, essential   Goals of care, counseling/discussion   PAF (paroxysmal atrial fibrillation) (HCC)   COPD (chronic obstructive pulmonary disease) (HCC)   Chronic systolic CHF (congestive heart failure) (HCC)   Acute  respiratory failure with hypoxia (HCC)   Acute massive pulmonary embolism (HCC)   Pressure injury of skin   Palliative care by specialist   Hypotension   Agitation   Dementia with behavioral disturbance ([place])   Oropharyngeal dysphagia   Advanced care planning/counseling discussion    Discharge Instructions  Discharge Instructions    Diet - low sodium heart healthy   Complete by: As directed    Increase activity slowly   Complete by: As directed    Increase activity slowly   Complete by: As directed      Allergies as of 12/04/2018      Reactions   Latex Itching   Haldol [haloperidol Lactate] Other (See Comments)   Increased agitation   Haldol [haloperidol]       Medication List    STOP taking these medications   acetaminophen 325 MG tablet Commonly known as: TYLENOL   albuterol (2.5 MG/3ML) 0.083% nebulizer solution Commonly known as: PROVENTIL   albuterol 108 (90 Base) MCG/ACT inhaler Commonly known as: VENTOLIN HFA   amLODipine 2.5 MG tablet Commonly known as: NORVASC   ascorbic acid 500 MG tablet Commonly known as: VITAMIN C   aspirin EC 81 MG tablet   atorvastatin 20 MG tablet Commonly known as: LIPITOR   gabapentin 100 MG capsule Commonly known as: NEURONTIN   glimepiride 2 MG tablet Commonly known as: AMARYL   insulin aspart 100 UNIT/ML injection Commonly known as: novoLOG   lisinopril 20 MG tablet Commonly known as: ZESTRIL   metFORMIN 500 MG tablet Commonly known as: GLUCOPHAGE   pantoprazole 40 MG tablet Commonly known as: PROTONIX   PARoxetine 30 MG tablet Commonly known as: PAXIL   predniSONE 10 MG tablet Commonly  known as: DELTASONE   primidone 50 MG tablet Commonly known as: MYSOLINE   tiotropium 18 MCG inhalation capsule Commonly known as: SPIRIVA   traZODone 50 MG tablet Commonly known as: DESYREL   zinc sulfate 220 (50 Zn) MG capsule     TAKE these medications   antiseptic oral rinse Liqd Apply 15 mLs topically  as needed for dry mouth.   mouth rinse Liqd solution 15 mLs by Mouth Rinse route 2 times daily at 12 noon and 4 pm.   glycopyrrolate 0.2 MG/ML injection Commonly known as: ROBINUL Inject 1.5 mLs (0.3 mg total) into the vein every 4 (four) hours as needed (excessive secretions).   LORazepam 2 MG/ML injection Commonly known as: ATIVAN Inject 0.25-0.5 mLs (0.5-1 mg total) into the vein every 4 (four) hours as needed for anxiety (agitation).   morphine 2 MG/ML injection Inject 0.5-1 mLs (1-2 mg total) into the vein every hour as needed (air hunger).   morphine CONCENTRATE 10 MG/0.5ML Soln concentrated solution Place 0.25 mLs (5 mg total) under the tongue every 2 (two) hours as needed for moderate pain (or dyspnea).   ondansetron 4 MG/2ML Soln injection Commonly known as: ZOFRAN Inject 2 mLs (4 mg total) into the vein every 6 (six) hours as needed for nausea.   polyethylene glycol 17 g packet Commonly known as: MIRALAX / GLYCOLAX Take 17 g by mouth daily as needed for mild constipation.   polyvinyl alcohol 1.4 % ophthalmic solution Commonly known as: LIQUIFILM TEARS Place 1 drop into both eyes 4 (four) times daily as needed for dry eyes.   QUEtiapine 50 MG tablet Commonly known as: SEROQUEL Take 50 mg by mouth 2 (two) times daily. What changed: Another medication with the same name was removed. Continue taking this medication, and follow the directions you see here.   sodium chloride 0.65 % Soln nasal spray Commonly known as: OCEAN Place 1 spray into both nostrils as needed for congestion.      Follow-up Information    Dew, Erskine Squibb, MD Follow up in 2 week(s).   Specialties: Vascular Surgery, Radiology, Interventional Cardiology Why: Can see Dew or Arna Medici. PE / DVT. Will need bilateral lower extremity DVT with visit. Contact information: [place] [address] [phone]          Allergies  Allergen Reactions  . Latex Itching  . Haldol [Haloperidol  Lactate] Other (See Comments)    Increased agitation  . Haldol [Haloperidol]     Consultations:  Vascular surgery  Psychiatry  Palliative care   Procedures/Studies: Ct Head Wo Contrast  Result Date: 11/14/2018 CLINICAL DATA:  Encephalopathy EXAM: CT HEAD WITHOUT CONTRAST TECHNIQUE: Contiguous axial images were obtained from the base of the skull through the vertex without intravenous contrast. COMPARISON:  Head CT 06/06/2018 FINDINGS: Brain: There is no mass, hemorrhage or extra-axial collection. There is generalized atrophy without lobar predilection. Hypodensity of the white matter is most commonly associated with chronic microvascular disease. Vascular: No abnormal hyperdensity of the major intracranial arteries or dural venous sinuses. No intracranial atherosclerosis. Skull: The visualized skull base, calvarium and extracranial soft tissues are normal. Sinuses/Orbits: No fluid levels or advanced mucosal thickening of the visualized paranasal sinuses. No mastoid or middle ear effusion. The orbits are normal. IMPRESSION: Generalized atrophy and chronic microvascular ischemia without acute intracranial abnormality. Electronically Signed   By: Ulyses Jarred M.D.   On: 11/14/2018 03:08   Ct Angio Chest Pe W And/or Wo Contrast  Result Date: 11/13/2018 CLINICAL  DATA:  Hypoxia. Elevated D-dimer. The patient is COVID positive EXAM: CT ANGIOGRAPHY CHEST WITH CONTRAST TECHNIQUE: Multidetector CT imaging of the chest was performed using the standard protocol during bolus administration of intravenous contrast. Multiplanar CT image reconstructions and MIPs were obtained to evaluate the vascular anatomy. CONTRAST:  58mL OMNIPAQUE IOHEXOL 350 MG/ML SOLN COMPARISON:  CT dated June 06, 2018. FINDINGS: Cardiovascular: Contrast injection is sufficient to demonstrate satisfactory opacification of the pulmonary arteries to the segmental level.There are acute bilateral pulmonary emboli. For example there is an  embolus in the main right pulmonary artery extending into the right, middle, and upper lobar branches. There are acute pulmonary emboli involving the left lower lobe pulmonary artery extending into segmental and subsegmental branches. The main pulmonary artery is within normal limits for size. There is no evidence for right-sided heart strain with the RV LV ratio measuring less than 0.9. There is some reflux into the IVC consistent with some underlying cardiac dysfunction. Atherosclerotic changes are noted throughout the thoracic aorta. Mediastinum/Nodes: --there is a 4.5 x 1.6 cm right upper paratracheal mass. This is essentially unchanged from prior CT in 2018 and is favored to represent a inferior right thyroid nodule/mass. --No axillary lymphadenopathy. --No supraclavicular lymphadenopathy. --Normal thyroid gland. --The esophagus is unremarkable Lungs/Pleura: There is scattered ground-glass airspace opacities bilaterally. There is no pneumothorax. There is no significant pleural effusion. The trachea is unremarkable. Upper Abdomen: There is cholelithiasis without CT evidence for acute cholecystitis. Musculoskeletal: There is an old healed fracture of the sternum. No new acute displaced fracture. Review of the MIP images confirms the above findings. IMPRESSION: 1. Acute bilateral pulmonary emboli as detailed above, greatest on the right. While there is no CT evidence for right heart strain, there is some reflux of contrast into the IVC consistent with some degree of right-sided heart dysfunction. 2. Bilateral ground-glass airspace opacities consistent with the patient's history of viral pneumonia. 3. There is cholelithiasis without secondary signs of acute cholecystitis. Aortic Atherosclerosis (ICD10-I70.0). These results were called by telephone at the time of interpretation on 11/13/2018 at 6:01 pm to provider [facility] , who verbally acknowledged these results. Electronically Signed   By: Constance Holster M.D.   On: 11/13/2018 18:05   Dg Chest Port 1 View  Result Date: 11/13/2018 CLINICAL DATA:  Shortness of breath.  COVID-19. EXAM: PORTABLE CHEST 1 VIEW COMPARISON:  10/23/2018 FINDINGS: The heart size and mediastinal contours are within normal limits. Both lungs are clear. The visualized skeletal structures are unremarkable. IMPRESSION: No active disease. Electronically Signed   By: Lorriane Shire M.D.   On: 11/13/2018 15:32       Subjective: Patient confused no acute distress  Discharge Exam: Vitals:   12/03/18 2301 12/04/18 0813  BP:  126/75  Pulse:  66  Resp:  16  Temp:  97.9 F (36.6 C)  SpO2: 98% 100%   Vitals:   12/03/18 0744 12/03/18 2046 12/03/18 2301 12/04/18 0813  BP: 114/74 122/76  126/75  Pulse: 96 100  66  Resp: 18   16  Temp: 98.8 F (37.1 C) 98.6 F (37 C)  97.9 F (36.6 C)  TempSrc: Axillary Oral  Oral  SpO2: (!) 89% (!) 85% 98% 100%  Weight:      Height:        General: Pt is awake, somnolent NAD Cardiovascular: RRR, S1/S2 +, no rubs, no gallops Respiratory: CTA bilaterally, no wheezing, no rhonchi Abdominal: Soft, NT, ND, bowel sounds + Extremities: no edema, no  cyanosis    The results of significant diagnostics from this hospitalization (including imaging, microbiology, ancillary and laboratory) are listed below for reference.     Microbiology: No results found for this or any previous visit (from the past 240 hour(s)).   Labs: BNP (last 3 results) Recent Labs    06/06/18 1600 11/14/18 0000  BNP 285.0* 0000000   Basic Metabolic Panel: No results for input(s): NA, K, CL, CO2, GLUCOSE, BUN, CREATININE, CALCIUM, MG, PHOS in the last 168 hours. Liver Function Tests: No results for input(s): AST, ALT, ALKPHOS, BILITOT, PROT, ALBUMIN in the last 168 hours. No results for input(s): LIPASE, AMYLASE in the last 168 hours. No results for input(s): AMMONIA in the last 168 hours. CBC: Recent Labs  Lab 11/29/18 0431 11/30/18 0838  11/30/18 1023  WBC 5.6 5.9  --   HGB 7.1* 7.8*  --   HCT 22.2* 23.9*  --   MCV 107.2* 107.7*  --   PLT 68* 49* 50*   Cardiac Enzymes: No results for input(s): CKTOTAL, CKMB, CKMBINDEX, TROPONINI in the last 168 hours. BNP: Invalid input(s): POCBNP CBG: Recent Labs  Lab 11/30/18 0503 11/30/18 0615 11/30/18 0754 11/30/18 1201 12/01/18 0820  GLUCAP 84 111* 100* 121* 102*   D-Dimer No results for input(s): DDIMER in the last 72 hours. Hgb A1c No results for input(s): HGBA1C in the last 72 hours. Lipid Profile No results for input(s): CHOL, HDL, LDLCALC, TRIG, CHOLHDL, LDLDIRECT in the last 72 hours. Thyroid function studies No results for input(s): TSH, T4TOTAL, T3FREE, THYROIDAB in the last 72 hours.  Invalid input(s): FREET3 Anemia work up No results for input(s): VITAMINB12, FOLATE, FERRITIN, TIBC, IRON, RETICCTPCT in the last 72 hours. Urinalysis    Component Value Date/Time   COLORURINE AMBER (A) 11/13/2018 1457   APPEARANCEUR CLOUDY (A) 11/13/2018 1457   APPEARANCEUR Clear 10/24/2017 1538   LABSPEC 1.030 11/13/2018 1457   PHURINE 5.0 11/13/2018 1457   GLUCOSEU NEGATIVE 11/13/2018 1457   HGBUR NEGATIVE 11/13/2018 1457   BILIRUBINUR NEGATIVE 11/13/2018 1457   BILIRUBINUR negative 02/27/2018 1636   BILIRUBINUR Negative 10/24/2017 1538   KETONESUR NEGATIVE 11/13/2018 1457   PROTEINUR 30 (A) 11/13/2018 1457   UROBILINOGEN 0.2 02/27/2018 1636   NITRITE NEGATIVE 11/13/2018 1457   LEUKOCYTESUR NEGATIVE 11/13/2018 1457   Sepsis Labs Invalid input(s): PROCALCITONIN,  WBC,  LACTICIDVEN Microbiology No results found for this or any previous visit (from the past 240 hour(s)).   Time coordinating discharge: Over 30 minutes  SIGNED:   Nolberto Hanlon, MD  [facility] 12/04/2018, 11:48 AM Pager   If 7PM-7AM, please contact night-coverage [email or web address] Password [email or web address]

## 2018-12-04 NOTE — TOC Progression Note (Signed)
Transition of Care [facility]) - Progression Note    Patient Details  Name: Chase Simmons MRN: [id number] Date of Birth: 1933-09-21  Transition of [facility] CM/SW Contact  Cecil Cobbs Phone Number: 12/04/2018, 6:49 PM  Clinical Narrative:    Patient to be d/c'ed today to [facility].  Patient and family agreeable to plans will transport via ems Hospice RN to call report.      Expected Discharge Plan: [place] Barriers to Discharge: Barriers Resolved  Expected Discharge Plan and Services Expected Discharge Plan: [place] In-house Referral: Hospice / Palliative Care, Clinical Social Work     Living arrangements for the past 2 months: [place] Expected Discharge Date: 12/04/18                                     Social Determinants of Health (SDOH) Interventions    Readmission Risk Interventions Readmission Risk Prevention Plan 11/16/2018 06/07/2018  Transportation Screening Complete Complete  PCP or Specialist Appt within 3-5 Days - Complete  HRI or [place] - Complete  Social Work Consult for [place] Planning/Counseling - Not Complete  Medication Review [employer]) - Complete  PCP or Specialist appointment within 3-5 days of discharge Complete -  [place] or [place] Complete -  SW Recovery Care/Counseling Consult Complete -  Palliative Care Screening Complete -  [place] Not Applicable -  Some recent data might be hidden

## 2018-12-04 NOTE — Progress Notes (Signed)
Follow up visit made to new referral for [facility] hospice home. Patient seen lying in bed, eyes closed, appeared comfortable. Daughter [name] at bedside and daughter [name] arrived during visit. Plan is for discharge to the hospice home today. Writer to call report and arrange for 5 pm EMS transport. Discharge summary to be faxed when ready. CSW Evette Cristal, family and attending physician made aware. Will continue to follow through discharge. Flo Shanks BSN, RN, [place] [phone]

## 2018-12-11 ENCOUNTER — Encounter: Payer: Self-pay | Admitting: Emergency Medicine

## 2018-12-11 ENCOUNTER — Inpatient Hospital Stay
Admission: EM | Admit: 2018-12-11 | Discharge: 2018-12-12 | DRG: 690 | Disposition: A | Discharge status: Hospice | Source: Hospice | Attending: Internal Medicine | Admitting: Internal Medicine

## 2018-12-11 ENCOUNTER — Emergency Department

## 2018-12-11 ENCOUNTER — Other Ambulatory Visit: Payer: Self-pay

## 2018-12-11 DIAGNOSIS — N3000 Acute cystitis without hematuria: Secondary | ICD-10-CM

## 2018-12-11 DIAGNOSIS — F0391 Unspecified dementia with behavioral disturbance: Secondary | ICD-10-CM | POA: Diagnosis present

## 2018-12-11 DIAGNOSIS — J449 Chronic obstructive pulmonary disease, unspecified: Secondary | ICD-10-CM | POA: Diagnosis present

## 2018-12-11 DIAGNOSIS — R404 Transient alteration of awareness: Secondary | ICD-10-CM | POA: Diagnosis not present

## 2018-12-11 DIAGNOSIS — Z86711 Personal history of pulmonary embolism: Secondary | ICD-10-CM | POA: Diagnosis not present

## 2018-12-11 DIAGNOSIS — E861 Hypovolemia: Secondary | ICD-10-CM | POA: Diagnosis present

## 2018-12-11 DIAGNOSIS — Z66 Do not resuscitate: Secondary | ICD-10-CM | POA: Diagnosis present

## 2018-12-11 DIAGNOSIS — Z978 Presence of other specified devices: Secondary | ICD-10-CM

## 2018-12-11 DIAGNOSIS — E119 Type 2 diabetes mellitus without complications: Secondary | ICD-10-CM | POA: Diagnosis present

## 2018-12-11 DIAGNOSIS — Z515 Encounter for palliative care: Secondary | ICD-10-CM | POA: Diagnosis not present

## 2018-12-11 DIAGNOSIS — I5022 Chronic systolic (congestive) heart failure: Secondary | ICD-10-CM | POA: Diagnosis not present

## 2018-12-11 DIAGNOSIS — R627 Adult failure to thrive: Secondary | ICD-10-CM

## 2018-12-11 DIAGNOSIS — I480 Paroxysmal atrial fibrillation: Secondary | ICD-10-CM | POA: Diagnosis present

## 2018-12-11 DIAGNOSIS — I110 Hypertensive heart disease with heart failure: Secondary | ICD-10-CM | POA: Diagnosis present

## 2018-12-11 DIAGNOSIS — N390 Urinary tract infection, site not specified: Principal | ICD-10-CM | POA: Diagnosis present

## 2018-12-11 DIAGNOSIS — F0390 Unspecified dementia without behavioral disturbance: Secondary | ICD-10-CM | POA: Diagnosis not present

## 2018-12-11 DIAGNOSIS — E870 Hyperosmolality and hypernatremia: Secondary | ICD-10-CM | POA: Diagnosis present

## 2018-12-11 DIAGNOSIS — Z7189 Other specified counseling: Secondary | ICD-10-CM

## 2018-12-11 DIAGNOSIS — E860 Dehydration: Secondary | ICD-10-CM | POA: Diagnosis not present

## 2018-12-11 DIAGNOSIS — F03918 Unspecified dementia, unspecified severity, with other behavioral disturbance: Secondary | ICD-10-CM | POA: Diagnosis present

## 2018-12-11 DIAGNOSIS — I10 Essential (primary) hypertension: Secondary | ICD-10-CM | POA: Diagnosis not present

## 2018-12-11 DIAGNOSIS — R000 Tachycardia, unspecified: Secondary | ICD-10-CM | POA: Diagnosis not present

## 2018-12-11 DIAGNOSIS — A419 Sepsis, unspecified organism: Secondary | ICD-10-CM

## 2018-12-11 DIAGNOSIS — Z8619 Personal history of other infectious and parasitic diseases: Secondary | ICD-10-CM | POA: Diagnosis not present

## 2018-12-11 DIAGNOSIS — R69 Illness, unspecified: Secondary | ICD-10-CM | POA: Diagnosis not present

## 2018-12-11 LAB — COMPREHENSIVE METABOLIC PANEL
ALT: 10 U/L (ref 0–44)
AST: 17 U/L (ref 15–41)
Albumin: 2.4 g/dL — ABNORMAL LOW (ref 3.5–5.0)
Alkaline Phosphatase: 73 U/L (ref 38–126)
Anion gap: 11 (ref 5–15)
BUN: 22 mg/dL (ref 8–23)
CO2: 22 mmol/L (ref 22–32)
Calcium: 8.1 mg/dL — ABNORMAL LOW (ref 8.9–10.3)
Chloride: 115 mmol/L — ABNORMAL HIGH (ref 98–111)
Creatinine, Ser: 0.8 mg/dL (ref 0.61–1.24)
GFR calc Af Amer: >60 mL/min (ref >60)
GFR calc non Af Amer: >60 mL/min (ref >60)
Glucose, Bld: 166 mg/dL — ABNORMAL HIGH (ref 70–99)
Potassium: 3.6 mmol/L (ref 3.5–5.1)
Sodium: 148 mmol/L — ABNORMAL HIGH (ref 135–145)
Total Bilirubin: 1.3 mg/dL — ABNORMAL HIGH (ref 0.3–1.2)
Total Protein: 6.3 g/dL — ABNORMAL LOW (ref 6.5–8.1)

## 2018-12-11 LAB — CBC WITH DIFFERENTIAL/PLATELET
Abs Immature Granulocytes: 0.03 10*3/uL (ref 0.00–0.07)
Basophils Absolute: 0.1 10*3/uL (ref 0.0–0.1)
Basophils Relative: 1 %
Eosinophils Absolute: 0 10*3/uL (ref 0.0–0.5)
Eosinophils Relative: 0 %
HCT: 34.1 % — ABNORMAL LOW (ref 39.0–52.0)
Hemoglobin: 10.6 g/dL — ABNORMAL LOW (ref 13.0–17.0)
Immature Granulocytes: 0 %
Lymphocytes Relative: 14 %
Lymphs Abs: 1.2 10*3/uL (ref 0.7–4.0)
MCH: 35.6 pg — ABNORMAL HIGH (ref 26.0–34.0)
MCHC: 31.1 g/dL (ref 30.0–36.0)
MCV: 114.4 fL — ABNORMAL HIGH (ref 80.0–100.0)
Monocytes Absolute: 1 10*3/uL (ref 0.1–1.0)
Monocytes Relative: 12 %
Neutro Abs: 6.3 10*3/uL (ref 1.7–7.7)
Neutrophils Relative %: 73 %
Platelets: 277 10*3/uL (ref 150–400)
RBC: 2.98 MIL/uL — ABNORMAL LOW (ref 4.22–5.81)
RDW: 18.5 % — ABNORMAL HIGH (ref 11.5–15.5)
WBC: 8.6 10*3/uL (ref 4.0–10.5)
nRBC: 0 % (ref 0.0–0.2)

## 2018-12-11 LAB — URINALYSIS, ROUTINE W REFLEX MICROSCOPIC
Bilirubin Urine: NEGATIVE
Glucose, UA: NEGATIVE mg/dL
Ketones, ur: 20 mg/dL — AB
Nitrite: POSITIVE — AB
Protein, ur: 30 mg/dL — AB
Specific Gravity, Urine: 1.026 (ref 1.005–1.030)
Squamous Epithelial / HPF: NONE SEEN (ref 0–5)
WBC, UA: >50 WBC/hpf — ABNORMAL HIGH (ref 0–5)
pH: 5 (ref 5.0–8.0)

## 2018-12-11 LAB — PROTIME-INR
INR: 1.3 — ABNORMAL HIGH (ref 0.8–1.2)
Prothrombin Time: 15.8 seconds — ABNORMAL HIGH (ref 11.4–15.2)

## 2018-12-11 LAB — LACTIC ACID, PLASMA: Lactic Acid, Venous: 1.7 mmol/L (ref 0.5–1.9)

## 2018-12-11 LAB — APTT: aPTT: 31 seconds (ref 24–36)

## 2018-12-11 LAB — POC SARS CORONAVIRUS 2 AG: SARS Coronavirus 2 Ag: NEGATIVE

## 2018-12-11 MED ORDER — ENOXAPARIN SODIUM 40 MG/0.4ML ~~LOC~~ SOLN
40.0000 mg | SUBCUTANEOUS | Status: DC
  Administered 2018-12-12: 40 mg via SUBCUTANEOUS
  Filled 2018-12-11 (×3): qty 0.4

## 2018-12-11 MED ORDER — VANCOMYCIN HCL IN DEXTROSE 750-5 MG/150ML-% IV SOLN
750.0000 mg | Freq: Once | INTRAVENOUS | Status: AC
  Administered 2018-12-11: 750 mg via INTRAVENOUS
  Filled 2018-12-11: qty 150

## 2018-12-11 MED ORDER — VANCOMYCIN HCL IN DEXTROSE 1-5 GM/200ML-% IV SOLN
1000.0000 mg | Freq: Once | INTRAVENOUS | Status: AC
  Administered 2018-12-11: 1000 mg via INTRAVENOUS
  Filled 2018-12-11: qty 200

## 2018-12-11 MED ORDER — METRONIDAZOLE IN NACL 5-0.79 MG/ML-% IV SOLN
500.0000 mg | Freq: Once | INTRAVENOUS | Status: AC
  Administered 2018-12-11: 500 mg via INTRAVENOUS
  Filled 2018-12-11: qty 100

## 2018-12-11 MED ORDER — SODIUM CHLORIDE 0.9 % IV SOLN
2.0000 g | Freq: Once | INTRAVENOUS | Status: AC
  Administered 2018-12-11: 2 g via INTRAVENOUS
  Filled 2018-12-11: qty 2

## 2018-12-11 MED ORDER — LACTATED RINGERS IV BOLUS
1000.0000 mL | Freq: Once | INTRAVENOUS | Status: AC
  Administered 2018-12-11: 1000 mL via INTRAVENOUS

## 2018-12-11 MED ORDER — QUETIAPINE FUMARATE 25 MG PO TABS
50.0000 mg | ORAL_TABLET | Freq: Two times a day (BID) | ORAL | Status: DC
  Filled 2018-12-11 (×2): qty 2

## 2018-12-11 MED ORDER — SODIUM CHLORIDE 0.9 % IV SOLN
1.0000 g | INTRAVENOUS | Status: DC
  Administered 2018-12-12: 1 g via INTRAVENOUS
  Filled 2018-12-11 (×2): qty 10

## 2018-12-11 NOTE — H&P (Addendum)
History and Physical    Chase Simmons [id number] DOB: 21-May-1933 DOA: 12/11/2018  PCP: [name], MD  Patient coming from: Hospice house  I have personally briefly reviewed patient's old medical records in [place]  Chief Complaint: fever and altered mental status  HPI: Chase Simmons is a 85 y.o. male with medical history significant of advanced dementia, chronic systolic heart failure, paroxysmal atrial fibrillation (not a good candidate for anticoagulation due to frailty and advanced age), hypertension, COPD, type 2 diabetes, recent COVID infection (admitted to [facility] from 10/15-10/21) who presents from hospice house at the request of daughter for fever and altered mental status.  Patient was recently hospitalized from 11/10-12/1 for massive pulmonary embolism requiring thrombolysis and bilateral pulmonary artery TPA. Patient had a delayed recovery course with poor PO intake and an unsuccessful NG tube placement attempt due to agitation. Family ultimately decided on comfort care and was discharged to hospice house. He presents today for fever and altered mental status noted by his daughter. Family members reportedly now in disagreement about goal of care and would like him to be admitted for further workup and treatment.   He had a temperature of 99 and was tachycardic up to 119.  He had intermittent hypertension up to 160s over 90s on room air.  CBC showed no leukocytosis and stable anemia with hemoglobin of 10.6.  Lactic acid of 1.7.  Sodium of 148, potassium 3.6, glucose of 166, normal creatinine of 0.80.  Mildly elevated bilirubin of 1.3.  Urinalysis shows positive nitrite, moderate leukocyte and many bacteria. COVID test negative.  He was given 2 L of LR bolus and was started on broad-spectrum antibiotics with IV cefepime and Rocephin prior to the results of the urinalysis.  Review of Systems: Unable to obtain due to patient's dementia  Past Medical  History:  Diagnosis Date  . Allergy   . Bladder cancer ([place])    a. followed by Dr. Jacqlyn Larsen  . Chronic combined systolic (congestive) and diastolic (congestive) heart failure ([place])    a. 07/2016 Echo: EF 30-35%, sev mid-apicalanteroseptal, ant, inf HK, Gr1 DD.  Marland Kitchen COPD (chronic obstructive pulmonary disease) ([place])   . Hyperlipidemia   . Hypertension   . NICM (nonischemic cardiomyopathy) ([place])    a. 07/2016 Echo: EF 30-35%, sev mid-apicalanteroseptal, ant, inf HK, Gr1 DD, mild MR, mildly dil LA, PASP 81mmHg - ? stress induced CM.  Marland Kitchen Nonobstructive Coronary atherosclerosis    a. 07/2016 Cath: LM nl, LAD nl, D1 40ost, LCX 33m, RCA nl, EF 25-30%.  Marland Kitchen PAF (paroxysmal atrial fibrillation) ([place])    a. Dx 08/2013. CHA2DS2VASc = 6-->no OAC 2/2 h/o falls and nocompliance.  . Tremor, essential    sees [facility] neurologist  . Type 2 diabetes mellitus with complication, without long-term current use of insulin ([place]) 08/01/2012  . Urinary incontinence     Past Surgical History:  Procedure Laterality Date  . bladder cancer    . CATARACT EXTRACTION    . HERNIA REPAIR    . LEFT HEART CATH AND CORONARY ANGIOGRAPHY N/A 07/11/2016   Procedure: Left Heart Cath and Coronary Angiography;  Surgeon: Wellington Hampshire, MD;  Location: [facility];  Service: Cardiovascular;  Laterality: N/A;  . PULMONARY THROMBECTOMY Bilateral 11/13/2018   Procedure: PULMONARY THROMBECTOMY;  Surgeon: Algernon Huxley, MD;  Location: [facility];  Service: Cardiovascular;  Laterality: Bilateral;     reports that he quit smoking about 15 years ago. He has  a 60.00 pack-year smoking history. He has never used smokeless tobacco. He reports current alcohol use of about 12.0 standard drinks of alcohol per week. He reports that he does not use drugs.  Allergies  Allergen Reactions  . Latex Itching  . Haldol [Haloperidol Lactate] Other (See Comments)    Increased agitation  . Haldol [Haloperidol]     Family History  Problem  Relation Age of Onset  . Arthritis Mother   . Dementia Mother   . Cancer Maternal Uncle        prostate  . Cancer Maternal Aunt        breast cancer     Prior to Admission medications   Medication Sig Start Date End Date Taking? Authorizing Provider  antiseptic oral rinse (BIOTENE) LIQD Apply 15 mLs topically as needed for dry mouth. 12/04/18  Yes Nolberto Hanlon, MD  glycopyrrolate (ROBINUL) 0.2 MG/ML injection Inject 1.5 mLs (0.3 mg total) into the vein every 4 (four) hours as needed (excessive secretions). 12/04/18  Yes Nolberto Hanlon, MD  LORazepam (ATIVAN) 2 MG/ML injection Inject 0.25-0.5 mLs (0.5-1 mg total) into the vein every 4 (four) hours as needed for anxiety (agitation). 12/04/18  Yes Nolberto Hanlon, MD  morphine 2 MG/ML injection Inject 0.5-1 mLs (1-2 mg total) into the vein every hour as needed (air hunger). 12/04/18  Yes Nolberto Hanlon, MD  Morphine Sulfate (MORPHINE CONCENTRATE) 10 MG/0.5ML SOLN concentrated solution Place 0.25 mLs (5 mg total) under the tongue every 2 (two) hours as needed for moderate pain (or dyspnea). 12/04/18  Yes Nolberto Hanlon, MD  mouth rinse LIQD solution 15 mLs by Mouth Rinse route 2 times daily at 12 noon and 4 pm. 12/04/18  Yes Nolberto Hanlon, MD  ondansetron (ZOFRAN) 4 MG/2ML SOLN injection Inject 2 mLs (4 mg total) into the vein every 6 (six) hours as needed for nausea. 12/04/18  Yes Nolberto Hanlon, MD  polyethylene glycol (MIRALAX / GLYCOLAX) 17 g packet Take 17 g by mouth daily as needed for mild constipation. 12/04/18  Yes Nolberto Hanlon, MD  polyvinyl alcohol (LIQUIFILM TEARS) 1.4 % ophthalmic solution Place 1 drop into both eyes 4 (four) times daily as needed for dry eyes. 12/04/18  Yes Nolberto Hanlon, MD  QUEtiapine (SEROQUEL) 50 MG tablet Take 50 mg by mouth 2 (two) times daily.  10/06/18  Yes [provider]  sodium chloride (OCEAN) 0.65 % SOLN nasal spray Place 1 spray into both nostrils as needed for congestion. 12/04/18  Yes Nolberto Hanlon, MD    Physical  Exam: Vitals:   12/11/18 1830 12/11/18 1900 12/11/18 1930 12/11/18 2000  BP: (!) 163/90 (!) 147/108 (!) 141/82 (!) 155/82  Pulse: (!) 58 93 61 (!) 108  Resp: 15   18  Temp:      TempSrc:      SpO2: 100% 99% 100% 100%  Weight:      Height:        Constitutional: Nontoxic appearing elderly male who is laying in bed with his legs crossed with somewhat labored respiration Vitals:   12/11/18 1830 12/11/18 1900 12/11/18 1930 12/11/18 2000  BP: (!) 163/90 (!) 147/108 (!) 141/82 (!) 155/82  Pulse: (!) 58 93 61 (!) 108  Resp: 15   18  Temp:      TempSrc:      SpO2: 100% 99% 100% 100%  Weight:      Height:       Eyes: PERRL on the left but not right, right conjunctivae has overlaying  opacity ENMT: Mucous membranes are moist.  Neck: normal, supple Respiratory: clear to auscultation anteriorly no wheezing, no crackles. Deep labored respiration behind surgical mask with O2 saturation of 97% on room air.  Cardiovascular: Irregularly irregular rhythm with heart rate ranging from 110-120 on telemetry, no murmurs / rubs / gallops. No extremity edema.  Abdomen: no tenderness,  Bowel sounds positive.  GU: foley cather in place with 25 cc of amber colored urine in bag Musculoskeletal: no clubbing / cyanosis. No joint deformity upper and lower extremities.  Skin: no rashes, lesions, ulcers. No induration Neurologic: Opens eyes to voice but does not track. Not cooperative to exam and withdraws extremities to touch.   Psychiatric: unable to  assess due to dementia    Labs on Admission: I have personally reviewed following labs and imaging studies  CBC: Recent Labs  Lab 12/11/18 1842  WBC 8.6  NEUTROABS 6.3  HGB 10.6*  HCT 34.1*  MCV 114.4*  PLT 99991111   Basic Metabolic Panel: Recent Labs  Lab 12/11/18 1842  NA 148*  K 3.6  CL 115*  CO2 22  GLUCOSE 166*  BUN 22  CREATININE 0.80  CALCIUM 8.1*   GFR: Estimated Creatinine Clearance: 81.3 mL/min (by C-G formula based on SCr of 0.8  mg/dL). Liver Function Tests: Recent Labs  Lab 12/11/18 1842  AST 17  ALT 10  ALKPHOS 73  BILITOT 1.3*  PROT 6.3*  ALBUMIN 2.4*   No results for input(s): LIPASE, AMYLASE in the last 168 hours. No results for input(s): AMMONIA in the last 168 hours. Coagulation Profile: Recent Labs  Lab 12/11/18 1842  INR 1.3*   Cardiac Enzymes: No results for input(s): CKTOTAL, CKMB, CKMBINDEX, TROPONINI in the last 168 hours. BNP (last 3 results) No results for input(s): PROBNP in the last 8760 hours. HbA1C: No results for input(s): HGBA1C in the last 72 hours. CBG: No results for input(s): GLUCAP in the last 168 hours. Lipid Profile: No results for input(s): CHOL, HDL, LDLCALC, TRIG, CHOLHDL, LDLDIRECT in the last 72 hours. Thyroid Function Tests: No results for input(s): TSH, T4TOTAL, FREET4, T3FREE, THYROIDAB in the last 72 hours. Anemia Panel: No results for input(s): VITAMINB12, FOLATE, FERRITIN, TIBC, IRON, RETICCTPCT in the last 72 hours. Urine analysis:    Component Value Date/Time   COLORURINE AMBER (A) 12/11/2018 1842   APPEARANCEUR CLOUDY (A) 12/11/2018 1842   APPEARANCEUR Clear 10/24/2017 1538   LABSPEC 1.026 12/11/2018 1842   PHURINE 5.0 12/11/2018 1842   GLUCOSEU NEGATIVE 12/11/2018 1842   HGBUR MODERATE (A) 12/11/2018 1842   BILIRUBINUR NEGATIVE 12/11/2018 1842   BILIRUBINUR negative 02/27/2018 1636   BILIRUBINUR Negative 10/24/2017 1538   KETONESUR 20 (A) 12/11/2018 1842   PROTEINUR 30 (A) 12/11/2018 1842   UROBILINOGEN 0.2 02/27/2018 1636   NITRITE POSITIVE (A) 12/11/2018 1842   LEUKOCYTESUR MODERATE (A) 12/11/2018 1842    Radiological Exams on Admission: No results found.  EKG: Independently reviewed.   Assessment/Plan  Sepsis secondary to UTI -Initially given broad-spectrum antibiotics in the ED with IV vancomycin and cefepime.  Will transition to Rocephin daily -Urine culture pending -S/p 2 L normal saline bolus  Severe Dementia - non-verbal  -Palliative care consult to discuss goals of care with family.  Patient was previously discharged to hospice house but now family is in disagreement regarding goals of care. - had severe ICU delirium at last admission - continue seroquel  Hypernatremia -Secondary to dehydration.  S/p fluid resuscitation  Chronic systolic heart failure -Patient hypovolemic  Paroxysmal atrial fibrillation -periods of RVR on telemetry but non-sustained. No longer on rate controlled medication since last discharge to hospice house - Previously deemed not a candidate for anticoagulation given frailty/advanced age/advanced dementia  Hypertension -stable.  COPD - stable   Failure to thrive - pt NPO pending SLP eval.  NG tube placement attempted at last admission but unsuccessful due to agitation.  Appreciate palliative care discussion with family.    DVT prophylaxis:.Lovenox Code Status:DNR Family Communication: No family at bedside  disposition Plan: Home with at least 2 midnight stays  Consults called: Palliative care to assist with goals of care among family members regarding his goals of care. Pt has severe dementia with significant comorbidies and poor prognosis overall. Admission status: inpatient   Trinitey Roache T Roxy Filler DO [facility]   If 7PM-7AM, please contact night-coverage [email or web address] Password [facility]  12/11/2018, 8:56 PM

## 2018-12-11 NOTE — ED Notes (Signed)
ED TO INPATIENT HANDOFF REPORT  ED Nurse Name and Phone #: Willene Hatchet Name/Age/Gender Chase Simmons 85 y.o. male Room/Bed: ED34A/ED34A  Code Status   Code Status: DNR  Home/SNF/Other Skilled nursing facility Patient oriented to: self, place, time and situation Is this baseline? Yes   Triage Complete: Triage complete  Chief Complaint Sepsis  Triage Note Pt presents to ED via [facility] from [facility] for fever. Transport requested by pt's daughter who is concerned that pt has an infection. Pt was COVID+ 10/22 and has hx aspiration PNA. Pt has dementia, does not answer questions. Contractures noted to BLE. T99.0 axillary on arrival.    Allergies Allergies  Allergen Reactions  . Latex Itching  . Haldol [Haloperidol Lactate] Other (See Comments)    Increased agitation  . Haldol [Haloperidol]     Level of Care/Admitting Diagnosis ED Disposition    ED Disposition Condition [facility]: [place] [100120]  Level of Care: Med-Surg [16]  Covid Evaluation: Asymptomatic Screening Protocol (No Symptoms)  Diagnosis: Sepsis [facility]) [id number]  Admitting Physician: Orene Desanctis [id number]  Attending Physician: Orene Desanctis [id number]  Estimated length of stay: past midnight tomorrow  Certification:: I certify this patient will need inpatient services for at least 2 midnights  PT Class (Do Not Modify): Inpatient [101]  PT Acc Code (Do Not Modify): Private [1]       B Medical/Surgery History Past Medical History:  Diagnosis Date  . Allergy   . Bladder cancer ([place])    a. followed by Dr. Jacqlyn Larsen  . Chronic combined systolic (congestive) and diastolic (congestive) heart failure ([place])    a. 07/2016 Echo: EF 30-35%, sev mid-apicalanteroseptal, ant, inf HK, Gr1 DD.  Marland Kitchen COPD (chronic obstructive pulmonary disease) ([place])   . Hyperlipidemia   . Hypertension   . NICM (nonischemic cardiomyopathy) ([place])    a. 07/2016 Echo: EF 30-35%, sev  mid-apicalanteroseptal, ant, inf HK, Gr1 DD, mild MR, mildly dil LA, PASP 49mmHg - ? stress induced CM.  Marland Kitchen Nonobstructive Coronary atherosclerosis    a. 07/2016 Cath: LM nl, LAD nl, D1 40ost, LCX 68m, RCA nl, EF 25-30%.  Marland Kitchen PAF (paroxysmal atrial fibrillation) ([place])    a. Dx 08/2013. CHA2DS2VASc = 6-->no OAC 2/2 h/o falls and nocompliance.  . Tremor, essential    sees [facility] neurologist  . Type 2 diabetes mellitus with complication, without long-term current use of insulin ([place]) 08/01/2012  . Urinary incontinence    Past Surgical History:  Procedure Laterality Date  . bladder cancer    . CATARACT EXTRACTION    . HERNIA REPAIR    . LEFT HEART CATH AND CORONARY ANGIOGRAPHY N/A 07/11/2016   Procedure: Left Heart Cath and Coronary Angiography;  Surgeon: Wellington Hampshire, MD;  Location: [facility];  Service: Cardiovascular;  Laterality: N/A;  . PULMONARY THROMBECTOMY Bilateral 11/13/2018   Procedure: PULMONARY THROMBECTOMY;  Surgeon: Algernon Huxley, MD;  Location: [facility];  Service: Cardiovascular;  Laterality: Bilateral;     A IV Location/Drains/Wounds Patient Lines/Drains/Airways Status   Active Line/Drains/Airways    Name:   Placement date:   Placement time:   Site:   Days:   Peripheral IV 12/11/18 Right Forearm   12/11/18    1841    Forearm   less than 1   Peripheral IV 12/11/18 Right Hand   12/11/18    1842    Hand   less than 1  Pressure Injury 11/06/18 Sacrum Medial Stage I -  Intact skin with non-blanchable redness of a localized area usually over a bony prominence.   11/06/18    2345     35          Intake/Output Last 24 hours  Intake/Output Summary (Last 24 hours) at 12/11/2018 2248 Last data filed at 12/11/2018 2124 Gross per 24 hour  Intake 1550 ml  Output -  Net 1550 ml    Labs/Imaging Results for orders placed or performed during the hospital encounter of 12/11/18 (from the past 48 hour(s))  Lactic acid, plasma     Status: None   Collection Time:  12/11/18  6:42 PM  Result Value Ref Range   Lactic Acid, Venous 1.7 0.5 - 1.9 mmol/L    Comment: Performed at [facility], [address] [place], [place] [address]  Comprehensive metabolic panel     Status: Abnormal   Collection Time: 12/11/18  6:42 PM  Result Value Ref Range   Sodium 148 (H) 135 - 145 mmol/L   Potassium 3.6 3.5 - 5.1 mmol/L   [place] 115 (H) 98 - 111 mmol/L   CO2 22 22 - 32 mmol/L   Glucose, Bld 166 (H) 70 - 99 mg/dL   BUN 22 8 - 23 mg/dL   Creatinine, Ser 0.80 0.61 - 1.24 mg/dL   Calcium 8.1 (L) 8.9 - 10.3 mg/dL   Total Protein 6.3 (L) 6.5 - 8.1 g/dL   Albumin 2.4 (L) 3.5 - 5.0 g/dL   AST 17 15 - 41 U/L   ALT 10 0 - 44 U/L   Alkaline Phosphatase 73 38 - 126 U/L   Total Bilirubin 1.3 (H) 0.3 - 1.2 mg/dL   GFR calc non Af Amer >60 >60 mL/min   GFR calc Af Amer >60 >60 mL/min   Anion gap 11 5 - 15    Comment: Performed at [facility], [address] [place], [place] [address]  CBC WITH DIFFERENTIAL     Status: Abnormal   Collection Time: 12/11/18  6:42 PM  Result Value Ref Range   WBC 8.6 4.0 - 10.5 K/uL   RBC 2.98 (L) 4.22 - 5.81 MIL/uL   Hemoglobin 10.6 (L) 13.0 - 17.0 g/dL   HCT 34.1 (L) 39.0 - 52.0 %   MCV 114.4 (H) 80.0 - 100.0 fL   MCH 35.6 (H) 26.0 - 34.0 pg   MCHC 31.1 30.0 - 36.0 g/dL   RDW 18.5 (H) 11.5 - 15.5 %   Platelets 277 150 - 400 K/uL   nRBC 0.0 0.0 - 0.2 %   Neutrophils Relative % 73 %   Neutro Abs 6.3 1.7 - 7.7 K/uL   Lymphocytes Relative 14 %   Lymphs Abs 1.2 0.7 - 4.0 K/uL   Monocytes Relative 12 %   Monocytes Absolute 1.0 0.1 - 1.0 K/uL   Eosinophils Relative 0 %   Eosinophils Absolute 0.0 0.0 - 0.5 K/uL   Basophils Relative 1 %   Basophils Absolute 0.1 0.0 - 0.1 K/uL   Immature Granulocytes 0 %   Abs Immature Granulocytes 0.03 0.00 - 0.07 K/uL    Comment: Performed at [facility], [address] [place],  [address]  APTT     Status: None   Collection Time: 12/11/18  6:42 PM   Result Value Ref Range   aPTT 31 24 - 36 seconds    Comment: Performed at [facility], [address] [place],  [place] [address]  Protime-INR     Status: Abnormal   Collection Time: 12/11/18  6:42 PM  Result Value Ref Range   Prothrombin Time 15.8 (H) 11.4 - 15.2 seconds   INR 1.3 (H) 0.8 - 1.2    Comment: (NOTE) INR goal varies based on device and disease states. Performed at [facility], [address] [place], [place] [address]   Urinalysis, Routine w reflex microscopic     Status: Abnormal   Collection Time: 12/11/18  6:42 PM  Result Value Ref Range   Color, Urine AMBER (A) YELLOW    Comment: BIOCHEMICALS MAY BE AFFECTED BY COLOR   APPearance CLOUDY (A) CLEAR   Specific Gravity, Urine 1.026 1.005 - 1.030   pH 5.0 5.0 - 8.0   Glucose, UA NEGATIVE NEGATIVE mg/dL   Hgb urine dipstick MODERATE (A) NEGATIVE   Bilirubin Urine NEGATIVE NEGATIVE   Ketones, ur 20 (A) NEGATIVE mg/dL   Protein, ur 30 (A) NEGATIVE mg/dL   Nitrite POSITIVE (A) NEGATIVE   Leukocytes,Ua MODERATE (A) NEGATIVE   RBC / HPF 21-50 0 - 5 RBC/hpf   WBC, UA >50 (H) 0 - 5 WBC/hpf   Bacteria, UA MANY (A) NONE SEEN   Squamous Epithelial / LPF NONE SEEN 0 - 5    Comment: Performed at [facility], [address] [place], [place] [address]  POC SARS Coronavirus 2 Ag     Status: None   Collection Time: 12/11/18  8:20 PM  Result Value Ref Range   SARS Coronavirus 2 Ag NEGATIVE NEGATIVE    Comment: (NOTE) SARS-CoV-2 antigen NOT DETECTED.  Negative results are presumptive.  Negative results do not preclude SARS-CoV-2 infection and should not be used as the sole basis for treatment or other patient management decisions, including infection  control decisions, particularly in the presence of clinical signs and  symptoms consistent with COVID-19, or in those who have been in contact with the virus.  Negative results must be combined with clinical observations, patient history, and  epidemiological information. The expected result is Negative. Fact Sheet for Patients: PodPark.tn Fact Sheet for Healthcare Providers: GiftContent.is This test is not yet approved or cleared by the [place] FDA and  has been authorized for detection and/or diagnosis of SARS-CoV-2 by FDA under an Emergency Use Authorization (EUA).  This EUA will remain in effect (meaning this test can be used) for the duration of  the COVID-19 de claration under Section 564(b)(1) of the Act, 21 U.S.C. section 360bbb-3(b)(1), unless the authorization is terminated or revoked sooner.    Dg Chest Port 1 View  Result Date: 12/11/2018 CLINICAL DATA:  History of COVID-19 positivity in October with history of aspiration pneumonia and fevers EXAM: PORTABLE CHEST 1 VIEW COMPARISON:  11/13/2018 FINDINGS: Cardiac shadow is stable in appearance. The lungs are well aerated without focal infiltrate or sizable effusion. Degenerative changes about the right shoulder joint seen. The known right thyroid nodule is not well appreciated on this exam. IMPRESSION: No acute infiltrate is noted. Electronically Signed   By: Inez Catalina M.D.   On: 12/11/2018 19:07    Pending Labs Unresulted Labs (From admission, onward)    Start     Ordered   12/12/18 [id number]  Basic metabolic panel  Tomorrow morning,   STAT     12/11/18 2046   12/12/18 0500  CBC  Tomorrow morning,   STAT     12/11/18 2046   12/11/18 1956  SARS CORONAVIRUS 2 (TAT 6-24  HRS) Nasopharyngeal Nasopharyngeal Swab  (Asymptomatic/Tier 3)  Once,   STAT    Question Answer Comment  Is this test for diagnosis or screening Screening   Symptomatic for COVID-19 as defined by CDC No   Hospitalized for COVID-19 No   Admitted to ICU for COVID-19 No   Previously tested for COVID-19 Yes   Resident in a congregate (group) care setting Yes   Employed in healthcare setting No      12/11/18 1955   12/11/18 1826  Blood  Culture (routine x 2)  BLOOD CULTURE X 2,   STAT     12/11/18 1827   12/11/18 1826  Urine culture  ONCE - STAT,   STAT     12/11/18 1827          Vitals/Pain Today's Vitals   12/11/18 2030 12/11/18 2100 12/11/18 2130 12/11/18 2200  BP: 129/72 (!) 148/84 131/75 128/69  Pulse: 95 96 75 (!) 57  Resp:   20   Temp:      TempSrc:      SpO2: 100% 98% 98% 100%  Weight:      Height:        Isolation Precautions No active isolations  Medications Medications  enoxaparin (LOVENOX) injection 40 mg (has no administration in time range)  cefTRIAXone (ROCEPHIN) 1 g in sodium chloride 0.9 % 100 mL IVPB (has no administration in time range)  QUEtiapine (SEROQUEL) tablet 50 mg (has no administration in time range)  ceFEPIme (MAXIPIME) 2 g in sodium chloride 0.9 % 100 mL IVPB (0 g Intravenous Stopped 12/11/18 1926)  metroNIDAZOLE (FLAGYL) IVPB 500 mg (0 mg Intravenous Stopped 12/11/18 2034)  vancomycin (VANCOCIN) IVPB 1000 mg/200 mL premix (0 mg Intravenous Stopped 12/11/18 1958)  lactated ringers bolus 1,000 mL (0 mLs Intravenous Stopped 12/11/18 2025)  vancomycin (VANCOCIN) IVPB 750 mg/150 ml premix (0 mg Intravenous Stopped 12/11/18 2124)  lactated ringers bolus 1,000 mL (1,000 mLs Intravenous New Bag/Given 12/11/18 2025)    Mobility non-ambulatory High fall risk   Focused Assessments Pulmonary Assessment Handoff:  Lung sounds:   O2 Device: Room Air        R Recommendations: See Admitting Provider Note  Report given to:   Additional Notes:

## 2018-12-11 NOTE — Progress Notes (Signed)
PHARMACY -  BRIEF ANTIBIOTIC NOTE   Pharmacy has received consult(s) for cefepime/vancomycin from an ED provider.  The patient's profile has been reviewed for ht/wt/allergies/indication/available labs.    One time order(s) placed for cefepime 2g IV x1 and vancomycin 1 g IV x1 by the EDP.  Will order another 750 mg IV x1 dose of vancomycin for total loading dose of 1750 mg.   Further antibiotics/pharmacy consults should be ordered by admitting physician if indicated.                       Thank you, Chase Simmons 12/11/2018  6:43 PM

## 2018-12-11 NOTE — Progress Notes (Signed)
CODE SEPSIS - PHARMACY COMMUNICATION  **Broad Spectrum Antibiotics should be administered within 1 hour of Sepsis diagnosis**  Time Code Sepsis Called/Page Received: 1829  Antibiotics Ordered: cefepime/Flagyl/vancomycin  Time of 1st antibiotic administration: 1856  Additional action taken by pharmacy: n/a  If necessary, Name of Provider/Nurse Contacted: n/a    Rocky Morel ,PharmD Clinical Pharmacist  12/11/2018  6:42 PM

## 2018-12-11 NOTE — ED Triage Notes (Signed)
Pt presents to ED via [facility] from [facility] for fever. Transport requested by pt's daughter who is concerned that pt has an infection. Pt was COVID+ 10/22 and has hx aspiration PNA. Pt has dementia, does not answer questions. Contractures noted to BLE. T99.0 axillary on arrival.

## 2018-12-11 NOTE — ED Notes (Signed)
Leg bag changed by this RN. UA and urine culture obtained by this RN from new leg bag.

## 2018-12-11 NOTE — ED Provider Notes (Addendum)
e  [facility] Emergency Department Provider Note   ____________________________________________   First MD Initiated Contact with Patient 12/11/18 1813     (approximate)  I have reviewed the triage vital signs and the nursing notes.   HISTORY  Chief Complaint Fever    HPI Chase Simmons is a 85 y.o. male with past medical history of dementia, CHF, COPD, PE, atrial fibrillation, and diabetes who presents to the ED for altered mental status and fever.  History is currently limited secondary to patient's mental status.  [facility] is [facility] residing [facility] and was noted to have a fever yesterday.  EMS reports that he had an episode of vomiting a couple of days ago and there is a history of aspiration.  He has not appeared short of breath and has not been coughing.  He has very poor p.o. intake at baseline and had been receiving subcutaneous fluids at the hospice house.  He has chronic contractures to his bilateral lower extremities per EMS.  His daughter came to visit him at the hospice house earlier today and was concerned about the fever.  She also thought that he had an acute change in his mental status compared to yesterday and requested he be evaluated in the ED.        Past Medical History:  Diagnosis Date   Allergy    Bladder cancer ([place])    a. followed by Dr. Jacqlyn Larsen   Chronic combined systolic (congestive) and diastolic (congestive) heart failure ([place])    a. 07/2016 Echo: EF 30-35%, sev mid-apicalanteroseptal, ant, inf HK, Gr1 DD.   COPD (chronic obstructive pulmonary disease) (HCC)    Hyperlipidemia    Hypertension    NICM (nonischemic cardiomyopathy) ([place])    a. 07/2016 Echo: EF 30-35%, sev mid-apicalanteroseptal, ant, inf HK, Gr1 DD, mild MR, mildly dil LA, PASP 62mmHg - ? stress induced CM.   Nonobstructive Coronary atherosclerosis    a. 07/2016 Cath: LM nl, LAD nl, D1 40ost, LCX 5m, RCA nl, EF 25-30%.   PAF  (paroxysmal atrial fibrillation) ([place])    a. Dx 08/2013. CHA2DS2VASc = 6-->no OAC 2/2 h/o falls and nocompliance.   Tremor, essential    sees [facility] neurologist   Type 2 diabetes mellitus with complication, without long-term current use of insulin ([place]) 08/01/2012   Urinary incontinence     Patient Active Problem List   Diagnosis Date Noted   Oropharyngeal dysphagia    Advanced care planning/counseling discussion    Pressure injury of skin 11/14/2018   Palliative care by specialist    Hypotension    Agitation    Dementia with behavioral disturbance ([place])    Acute respiratory failure with hypoxia ([place]) 11/13/2018   Acute massive pulmonary embolism ([place]) 11/13/2018   COVID-19 10/19/2018   COVID-19 virus infection 10/18/2018   Weakness 06/07/2018   Skin tear of elbow without complication [id number]   Lactic acidosis 06/06/2018   Depression 04/20/2018   Altered mental status    Frequency of urination 03/01/2018   Confusion 03/01/2018   Bilateral leg edema 11/08/2016   Vertigo 08/25/2016   Nausea [id number]   Chronic systolic CHF (congestive heart failure) ([place]) 08/01/2016   Atypical chest pain 07/20/2016   COPD (chronic obstructive pulmonary disease) ([place]) 03/15/2016   Coronary atherosclerosis of native coronary artery 01/05/2016   Aortic atherosclerosis ([place]) 01/05/2016   Bradycardia 01/05/2016   Anxiety 06/17/2015   Malignant neoplasm of urinary bladder ([place]) 06/17/2015   Allergic rhinitis 05/14/2015  Knee pain, bilateral 03/16/2015   PAF (paroxysmal atrial fibrillation) ([place]) 08/30/2013   Acid reflux 08/30/2013   Goals of care, counseling/discussion 03/04/2013   Tremor, essential 10/22/2012   Hyperlipidemia 10/22/2012   HTN (hypertension) 08/01/2012   Type 2 diabetes mellitus with complication, without long-term current use of insulin ([place]) 08/01/2012   Benign prostatic hyperplasia with urinary obstruction 09/02/2011    Past  Surgical History:  Procedure Laterality Date   bladder cancer     CATARACT EXTRACTION     HERNIA REPAIR     LEFT HEART CATH AND CORONARY ANGIOGRAPHY N/A 07/11/2016   Procedure: Left Heart Cath and Coronary Angiography;  Surgeon: Wellington Hampshire, MD;  Location: [place] [facility];  Service: Cardiovascular;  Laterality: N/A;   PULMONARY THROMBECTOMY Bilateral 11/13/2018   Procedure: PULMONARY THROMBECTOMY;  Surgeon: Algernon Huxley, MD;  Location: [place] CV LAB;  Service: Cardiovascular;  Laterality: Bilateral;    Prior to Admission medications   Medication Sig Start Date End Date Taking? Authorizing Provider  antiseptic oral rinse (BIOTENE) LIQD Apply 15 mLs topically as needed for dry mouth. 12/04/18   Nolberto Hanlon, MD  glycopyrrolate (ROBINUL) 0.2 MG/ML injection Inject 1.5 mLs (0.3 mg total) into the vein every 4 (four) hours as needed (excessive secretions). 12/04/18   Nolberto Hanlon, MD  LORazepam (ATIVAN) 2 MG/ML injection Inject 0.25-0.5 mLs (0.5-1 mg total) into the vein every 4 (four) hours as needed for anxiety (agitation). 12/04/18   Nolberto Hanlon, MD  morphine 2 MG/ML injection Inject 0.5-1 mLs (1-2 mg total) into the vein every hour as needed (air hunger). 12/04/18   Nolberto Hanlon, MD  Morphine Sulfate (MORPHINE CONCENTRATE) 10 MG/0.5ML SOLN concentrated solution Place 0.25 mLs (5 mg total) under the tongue every 2 (two) hours as needed for moderate pain (or dyspnea). 12/04/18   Nolberto Hanlon, MD  mouth rinse LIQD solution 15 mLs by Mouth Rinse route 2 times daily at 12 noon and 4 pm. 12/04/18   Nolberto Hanlon, MD  ondansetron (ZOFRAN) 4 MG/2ML SOLN injection Inject 2 mLs (4 mg total) into the vein every 6 (six) hours as needed for nausea. 12/04/18   Nolberto Hanlon, MD  polyethylene glycol (MIRALAX / GLYCOLAX) 17 g packet Take 17 g by mouth daily as needed for mild constipation. 12/04/18   Nolberto Hanlon, MD  polyvinyl alcohol (LIQUIFILM TEARS) 1.4 % ophthalmic solution Place 1 drop into  both eyes 4 (four) times daily as needed for dry eyes. 12/04/18   Nolberto Hanlon, MD  QUEtiapine (SEROQUEL) 50 MG tablet Take 50 mg by mouth 2 (two) times daily.  10/06/18   [provider]  sodium chloride (OCEAN) 0.65 % SOLN nasal spray Place 1 spray into both nostrils as needed for congestion. 12/04/18   Nolberto Hanlon, MD    Allergies Latex, Haldol [haloperidol lactate], and Haldol [haloperidol]  Family History  Problem Relation Age of Onset   Arthritis Mother    Dementia Mother    Cancer Maternal Uncle        prostate   Cancer Maternal Aunt        breast cancer    Social History Social History   Tobacco Use   Smoking status: Former Smoker    Packs/day: 1.50    Years: 40.00    Pack years: 60.00    Quit date: 02/03/2003    Years since quitting: 15.8   Smokeless tobacco: Never Used  Substance Use Topics   Alcohol use: Yes    Alcohol/week:  12.0 standard drinks    Types: 12 Standard drinks or equivalent per week    Frequency: Never    Comment: 1 drink with dinner   Drug use: No    Review of Systems Unable to obtain secondary to dementia and altered mental status  ____________________________________________   PHYSICAL EXAM:  VITAL SIGNS: ED Triage Vitals  Enc Vitals Group     BP      Pulse      Resp      Temp      Temp src      SpO2      Weight      Height      Head Circumference      Peak Flow      Pain Score      Pain Loc      Pain Edu?      Excl. in [place]?     Constitutional: Obtunded and nonverbal Eyes: Conjunctivae are normal. Head: Atraumatic. Nose: No congestion/rhinnorhea. Mouth/Throat: Mucous membranes are dry. Neck: Normal ROM Cardiovascular: Tachycardic, regular rhythm. Grossly normal heart sounds. Respiratory: Normal respiratory effort.  No retractions. Lungs CTAB. Gastrointestinal: Soft and nontender. No distention. Genitourinary: Foley catheter in place, collecting bag with minimal amount of very dark  urine. Musculoskeletal: Lower extremity contractures noted. Neurologic: Nonverbal, minimal spontaneous movement. Skin: Pressure injury noted to sacrum, no associated edema, erythema, or warmth.  ____________________________________________   LABS (all labs ordered are listed, but only abnormal results are displayed)  Labs Reviewed  COMPREHENSIVE METABOLIC PANEL - Abnormal; Notable for the following components:      Result Value   Sodium 148 (*)    Chloride 115 (*)    Glucose, Bld 166 (*)    Calcium 8.1 (*)    Total Protein 6.3 (*)    Albumin 2.4 (*)    Total Bilirubin 1.3 (*)    All other components within normal limits  CBC WITH DIFFERENTIAL/PLATELET - Abnormal; Notable for the following components:   RBC 2.98 (*)    Hemoglobin 10.6 (*)    HCT 34.1 (*)    MCV 114.4 (*)    MCH 35.6 (*)    RDW 18.5 (*)    All other components within normal limits  PROTIME-INR - Abnormal; Notable for the following components:   Prothrombin Time 15.8 (*)    INR 1.3 (*)    All other components within normal limits  URINALYSIS, ROUTINE W REFLEX MICROSCOPIC - Abnormal; Notable for the following components:   Color, Urine AMBER (*)    APPearance CLOUDY (*)    Hgb urine dipstick MODERATE (*)    Ketones, ur 20 (*)    Protein, ur 30 (*)    Nitrite POSITIVE (*)    Leukocytes,Ua MODERATE (*)    WBC, UA >50 (*)    Bacteria, UA MANY (*)    All other components within normal limits  CULTURE, BLOOD (ROUTINE X 2)  CULTURE, BLOOD (ROUTINE X 2)  URINE CULTURE  LACTIC ACID, PLASMA  APTT   ____________________________________________  EKG  ED ECG REPORT I, Blake Divine, the attending physician, personally viewed and interpreted this ECG.   Date: 12/11/2018  EKG Time: 18:14  Rate: 102  Rhythm: atrial fibrillation, rate 102  Axis: LAD  Intervals:left bundle branch block  ST&T Change: None   PROCEDURES  Procedure(s) performed (including Critical Care):  .Critical Care Performed by:  Blake Divine, MD Authorized by: Blake Divine, MD   Critical care provider statement:  Critical care time (minutes):  45   Critical care time was exclusive of:  Separately billable procedures and treating other patients and teaching time   Critical care was necessary to treat or prevent imminent or life-threatening deterioration of the following conditions:  Sepsis   Critical care was time spent personally by me on the following activities:  Discussions with consultants, evaluation of patient's response to treatment, examination of patient, ordering and performing treatments and interventions, ordering and review of laboratory studies, ordering and review of radiographic studies, pulse oximetry, re-evaluation of patient's condition, obtaining history from patient or surrogate and review of old charts   I assumed direction of critical care for this patient from another provider in my specialty: no       ____________________________________________   INITIAL IMPRESSION / ASSESSMENT AND PLAN / ED COURSE       85 year old male with history of dementia and multiple other medical problems presents to the ED from hospice house for fever and altered mental status noted by his daughter.  Initial presentation concerning for sepsis given reported fever and tachycardia, patient also appears very dehydrated.  Given his hospice status, I initially spoke with his daughter [name], who stated that she and her brother wish to maintain patient's hospice status and have him returned to the hospice house, however their sister [name] disagreed.  I was able to reach [place] over the phone at [phone] and she expressed a desire for patient to receive treatment for possible infection.  I explained to her that the prognosis in this patient is very poor and that in my opinion admission for antibiotics would not have long-term benefit.  She continued to insist that he be admitted for antibiotics and further  evaluation.  We will initiate sepsis work-up, no obvious source at this time however he is at risk both for aspiration and UTI given chronic indwelling Foley.  Will cover with broad-spectrum antibiotics for now.  UA is consistent with UTI, which is the likely source of his sepsis.  Labs are also consistent with dehydration given his hypernatremia and hyperchloremia, will give second liter IV fluids.  Chest x-ray appears negative by my read, COVID-19 testing pending.  Case discussed with hospitalist, who accepts patient for admission.      ____________________________________________   FINAL CLINICAL IMPRESSION(S) / ED DIAGNOSES  Final diagnoses:  Sepsis without acute organ dysfunction, due to unspecified organism [facility])  Acute cystitis without hematuria  Chronic indwelling Foley catheter  Dementia without behavioral disturbance, unspecified dementia type ([place])  Dehydration     ED Discharge Orders    None       Note:  This document was prepared using Dragon voice recognition software and may include unintentional dictation errors.   Blake Divine, MD 12/11/18 Blanchie Serve    Blake Divine, MD 02/26/19 2157

## 2018-12-11 NOTE — ED Notes (Signed)
Pt brief checked before transport. Pt brief dry at time of transport.

## 2018-12-12 DIAGNOSIS — Z7189 Other specified counseling: Secondary | ICD-10-CM | POA: Diagnosis not present

## 2018-12-12 DIAGNOSIS — A419 Sepsis, unspecified organism: Secondary | ICD-10-CM

## 2018-12-12 DIAGNOSIS — N390 Urinary tract infection, site not specified: Principal | ICD-10-CM

## 2018-12-12 DIAGNOSIS — Z515 Encounter for palliative care: Secondary | ICD-10-CM

## 2018-12-12 LAB — BASIC METABOLIC PANEL
Anion gap: 9 (ref 5–15)
BUN: 22 mg/dL (ref 8–23)
CO2: 22 mmol/L (ref 22–32)
Calcium: 8.1 mg/dL — ABNORMAL LOW (ref 8.9–10.3)
Chloride: 117 mmol/L — ABNORMAL HIGH (ref 98–111)
Creatinine, Ser: 0.79 mg/dL (ref 0.61–1.24)
GFR calc Af Amer: >60 mL/min (ref >60)
GFR calc non Af Amer: >60 mL/min (ref >60)
Glucose, Bld: 158 mg/dL — ABNORMAL HIGH (ref 70–99)
Potassium: 3.5 mmol/L (ref 3.5–5.1)
Sodium: 148 mmol/L — ABNORMAL HIGH (ref 135–145)

## 2018-12-12 LAB — CBC
HCT: 30.2 % — ABNORMAL LOW (ref 39.0–52.0)
Hemoglobin: 9.9 g/dL — ABNORMAL LOW (ref 13.0–17.0)
MCH: 35.5 pg — ABNORMAL HIGH (ref 26.0–34.0)
MCHC: 32.8 g/dL (ref 30.0–36.0)
MCV: 108.2 fL — ABNORMAL HIGH (ref 80.0–100.0)
Platelets: 236 10*3/uL (ref 150–400)
RBC: 2.79 MIL/uL — ABNORMAL LOW (ref 4.22–5.81)
RDW: 18 % — ABNORMAL HIGH (ref 11.5–15.5)
WBC: 8.4 10*3/uL (ref 4.0–10.5)
nRBC: 0 % (ref 0.0–0.2)

## 2018-12-12 LAB — SARS CORONAVIRUS 2 (TAT 6-24 HRS): SARS Coronavirus 2: NEGATIVE

## 2018-12-12 LAB — MRSA PCR SCREENING: MRSA by PCR: NEGATIVE

## 2018-12-12 MED ORDER — SULFAMETHOXAZOLE-TRIMETHOPRIM 800-160 MG PO TABS: 1.0000 | ORAL_TABLET | Freq: Two times a day (BID) | ORAL | 0 refills | Status: AC

## 2018-12-12 NOTE — Progress Notes (Signed)
Pt laying quietly in bed. EMS came approximately 1930 to transport patient to facility. Report given to EMS personal.

## 2018-12-12 NOTE — Progress Notes (Signed)
Patient is currently followed by Manufacturing engineer at the hospice home. He was sent to the [facility] ED last evening per request of his family for evaluation of a possible UTI. Patient seen lying in bed, with eyes closed, soft mumbling noted. No oral intake today, no oral medications He has received IV antibiotics.  Palliative NP and attending physician have spoken to family, plan is for discharge back to the Hospice home. Foley catheter to be changed to a condom catheter and patient will d/c with a prescription for oral antibiotics. Staff RN Jolly aware. Patient will need EMS transport with signed DNR in place. Writer to contact EMS for transport. Flo Shanks BSN, RN, [facility] [employer] [phone]

## 2018-12-12 NOTE — TOC Progression Note (Signed)
Transition of Care [facility]) - Progression Note    Patient Details  Name: Chase Simmons MRN: [id number] Date of Birth: Feb 14, 1933  [facility]) CM/SW Contact  Su Hilt, RN Phone Number: 12/12/2018, 12:27 PM  Clinical Narrative:    Damaris Schooner with Santiago Glad the [name] nurse, this patient is a patient from the [facility] and they will continue with his care        Expected Discharge Plan and Services                                                 Social Determinants of Health ([place]) Interventions    Readmission Risk Interventions Readmission Risk Prevention Plan 11/16/2018 06/07/2018  Transportation Screening Complete Complete  PCP or Specialist Appt within 3-5 Days - Complete  HRI or [place] - Complete  Social Work Consult for [place] Planning/Counseling - Not Complete  Medication Review Press photographer) - Complete  PCP or Specialist appointment within 3-5 days of discharge Complete -  [place] or [place] Complete -  SW Recovery Care/Counseling Consult Complete -  Palliative Care Screening Complete -  [place] Not Applicable -  Some recent data might be hidden

## 2018-12-12 NOTE — TOC Progression Note (Signed)
Transition of Care [facility]) - Progression Note    Patient Details  Name: Chase Simmons MRN: [id number] Date of Birth: 1933-04-23  Transition of Care [facility]) CM/SW [place], RN Phone Number: 12/12/2018, 2:42 PM  Clinical Narrative:     Son asked Dr Reesa Chew to send the patient back to the Hospice house today, Santiago Glad with Hospice verified this with the family.  The patient is to DC back to the Hospice facility today via EMS transport, Santiago Glad to call report and call EMS.         Expected Discharge Plan and Services                                                 Social Determinants of Health (SDOH) Interventions    Readmission Risk Interventions Readmission Risk Prevention Plan 11/16/2018 06/07/2018  Transportation Screening Complete Complete  PCP or Specialist Appt within 3-5 Days - Complete  HRI or [place] - Complete  Social Work Consult for [place] Planning/Counseling - Not Complete  Medication Review Press photographer) - Complete  PCP or Specialist appointment within 3-5 days of discharge Complete -  [place] or [place] Complete -  SW Recovery Care/Counseling Consult Complete -  Palliative Care Screening Complete -  [place] Not Applicable -  Some recent data might be hidden

## 2018-12-12 NOTE — Progress Notes (Signed)
SLP Cancellation Note  Patient Details Name: Chase Simmons MRN: [id number] DOB: 1933-08-17   Cancelled treatment:       Reason Eval/Treat Not Completed: Patient's level of consciousness;Patient not medically ready(chart reviewed; consulted MD/NSG re: pt's status).  He presents w/ decreased alertness/awareness, and Confusion. He presents w/ mumbled/muttered phonations and is not following instructions.   Recommend holding on BSE and oral intake at this time until pt is exhibits more awareness for oral intake, swallowing. ST services will f/u w/ BSE tomorrow. Recommend frequent oral care for hygiene and stimulation of swallowing; general aspiration precautions. Pt was just recently admitted, dx'd w/ dysphagia, and placed on a dysphagia diet. However, pt was not taking more than a few bites and sips via TSP intermittently -- not sufficient for maintaining an oral diet secondary to his advanced Cognitive decline.      Orinda Kenner, MS, CCC-SLP Watson,Katherine 12/12/2018, 11:00 AM

## 2018-12-12 NOTE — Discharge Summary (Signed)
Physician Discharge Summary  Chase Simmons [id number] DOB: 02-03-1933 DOA: 12/11/2018  PCP: [name], MD  Admit date: 12/11/2018 Discharge date: 12/12/2018  Admitted From: Hospice Home Disposition: Hospice Home  Recommendations for Outpatient Follow-up:  1.Please follow up on the following pending results: Urine culture  Home Health: No Equipment/Devices: None Discharge Condition: Hospice CODE STATUS: DNR Diet recommendation:  Regular / Dysphagia   Brief/Interim Summary: Chase Simmons is a 85 y.o. male with medical history significant of advanced dementia,chronic systolic heart failure, paroxysmal atrial fibrillation(not a good candidate for anticoagulation due to frailty andadvancedage), hypertension, COPD, type 2 diabetes, recent COVID infection(admitted to [facility] from 10/15-10/21)who presents from hospice house at the request of daughter for fever and altered mental status.  Patient was recently hospitalized from 11/10-12/1 for massive pulmonary embolism requiring thrombolysis and bilateral pulmonary artery TPA. Patient had a delayed recovery course with poor PO intake and an unsuccessful NG tube placement attempt due to agitation. Family ultimately decided on comfort care and was discharged to hospice house.  Was readmitted yesterday at the request of his daughter with altered mental status and fever.  Patient remained afebrile with [place].  He was found to have UA consistent with UTI.  Covid test was negative.  Patient remained nonverbal, not following any command.  Not eating or drinking. Apparently there are some disagreements between his children.  2 out of 3 wants him to stay at hospice home and 1 daughter wants to treat his recent infection.  After talking with multiple family members he will be discharged back to hospice home with p.o. antibiotics.  Foley catheter will be removed and patient will be placed on condom catheter per daughter's  request.  Patient has very poor prognosis.  Discharge Diagnoses:  Principal Problem:   Sepsis secondary to UTI [facility]) Active Problems:   Goals of care, counseling/discussion   PAF (paroxysmal atrial fibrillation) (HCC)   Chronic systolic CHF (congestive heart failure) ([place])   Dementia with behavioral disturbance (HCC)   Failure to thrive in adult    Discharge Instructions  Discharge Instructions    Diet - low sodium heart healthy   Complete by: As directed    Increase activity slowly   Complete by: As directed      Allergies as of 12/12/2018      Reactions   Latex Itching   Haldol [haloperidol Lactate] Other (See Comments)   Increased agitation   Haldol [haloperidol]       Medication List    TAKE these medications   antiseptic oral rinse Liqd Apply 15 mLs topically as needed for dry mouth.   mouth rinse Liqd solution 15 mLs by Mouth Rinse route 2 times daily at 12 noon and 4 pm.   glycopyrrolate 0.2 MG/ML injection Commonly known as: ROBINUL Inject 1.5 mLs (0.3 mg total) into the vein every 4 (four) hours as needed (excessive secretions).   LORazepam 2 MG/ML injection Commonly known as: ATIVAN Inject 0.25-0.5 mLs (0.5-1 mg total) into the vein every 4 (four) hours as needed for anxiety (agitation).   morphine 2 MG/ML injection Inject 0.5-1 mLs (1-2 mg total) into the vein every hour as needed (air hunger).   morphine CONCENTRATE 10 MG/0.5ML Soln concentrated solution Place 0.25 mLs (5 mg total) under the tongue every 2 (two) hours as needed for moderate pain (or dyspnea).   ondansetron 4 MG/2ML Soln injection Commonly known as: ZOFRAN Inject 2 mLs (4 mg total) into the vein every 6 (six) hours  as needed for nausea.   polyethylene glycol 17 g packet Commonly known as: MIRALAX / GLYCOLAX Take 17 g by mouth daily as needed for mild constipation.   polyvinyl alcohol 1.4 % ophthalmic solution Commonly known as: LIQUIFILM TEARS Place 1 drop into both eyes 4  (four) times daily as needed for dry eyes.   QUEtiapine 50 MG tablet Commonly known as: SEROQUEL Take 50 mg by mouth 2 (two) times daily.   sodium chloride 0.65 % Soln nasal spray Commonly known as: OCEAN Place 1 spray into both nostrils as needed for congestion.   sulfamethoxazole-trimethoprim 800-160 MG tablet Commonly known as: Bactrim DS Take 1 tablet by mouth 2 (two) times daily for 5 days.       Allergies  Allergen Reactions  . Latex Itching  . Haldol [Haloperidol Lactate] Other (See Comments)    Increased agitation  . Haldol [Haloperidol]     Consultations:  Palliative  Procedures/Studies: Ct Head Wo Contrast  Result Date: 11/14/2018 CLINICAL DATA:  Encephalopathy EXAM: CT HEAD WITHOUT CONTRAST TECHNIQUE: Contiguous axial images were obtained from the base of the skull through the vertex without intravenous contrast. COMPARISON:  Head CT 06/06/2018 FINDINGS: Brain: There is no mass, hemorrhage or extra-axial collection. There is generalized atrophy without lobar predilection. Hypodensity of the white matter is most commonly associated with chronic microvascular disease. Vascular: No abnormal hyperdensity of the major intracranial arteries or dural venous sinuses. No intracranial atherosclerosis. Skull: The visualized skull base, calvarium and extracranial soft tissues are normal. Sinuses/Orbits: No fluid levels or advanced mucosal thickening of the visualized paranasal sinuses. No mastoid or middle ear effusion. The orbits are normal. IMPRESSION: Generalized atrophy and chronic microvascular ischemia without acute intracranial abnormality. Electronically Signed   By: Ulyses Jarred M.D.   On: 11/14/2018 03:08   Ct Angio Chest Pe W And/or Wo Contrast  Result Date: 11/13/2018 CLINICAL DATA:  Hypoxia. Elevated D-dimer. The patient is COVID positive EXAM: CT ANGIOGRAPHY CHEST WITH CONTRAST TECHNIQUE: Multidetector CT imaging of the chest was performed using the standard  protocol during bolus administration of intravenous contrast. Multiplanar CT image reconstructions and MIPs were obtained to evaluate the vascular anatomy. CONTRAST:  56mL OMNIPAQUE IOHEXOL 350 MG/ML SOLN COMPARISON:  CT dated June 06, 2018. FINDINGS: Cardiovascular: Contrast injection is sufficient to demonstrate satisfactory opacification of the pulmonary arteries to the segmental level.There are acute bilateral pulmonary emboli. For example there is an embolus in the main right pulmonary artery extending into the right, middle, and upper lobar branches. There are acute pulmonary emboli involving the left lower lobe pulmonary artery extending into segmental and subsegmental branches. The main pulmonary artery is within normal limits for size. There is no evidence for right-sided heart strain with the RV LV ratio measuring less than 0.9. There is some reflux into the IVC consistent with some underlying cardiac dysfunction. Atherosclerotic changes are noted throughout the thoracic aorta. Mediastinum/Nodes: --there is a 4.5 x 1.6 cm right upper paratracheal mass. This is essentially unchanged from prior CT in 2018 and is favored to represent a inferior right thyroid nodule/mass. --No axillary lymphadenopathy. --No supraclavicular lymphadenopathy. --Normal thyroid gland. --The esophagus is unremarkable Lungs/Pleura: There is scattered ground-glass airspace opacities bilaterally. There is no pneumothorax. There is no significant pleural effusion. The trachea is unremarkable. Upper Abdomen: There is cholelithiasis without CT evidence for acute cholecystitis. Musculoskeletal: There is an old healed fracture of the sternum. No new acute displaced fracture. Review of the MIP images confirms the above findings. IMPRESSION: 1.  Acute bilateral pulmonary emboli as detailed above, greatest on the right. While there is no CT evidence for right heart strain, there is some reflux of contrast into the IVC consistent with some degree  of right-sided heart dysfunction. 2. Bilateral ground-glass airspace opacities consistent with the patient's history of viral pneumonia. 3. There is cholelithiasis without secondary signs of acute cholecystitis. Aortic Atherosclerosis (ICD10-I70.0). These results were called by telephone at the time of interpretation on 11/13/2018 at 6:01 pm to provider [facility] , who verbally acknowledged these results. Electronically Signed   By: Constance Holster M.D.   On: 11/13/2018 18:05   Dg Chest Port 1 View  Result Date: 12/11/2018 CLINICAL DATA:  History of COVID-19 positivity in October with history of aspiration pneumonia and fevers EXAM: PORTABLE CHEST 1 VIEW COMPARISON:  11/13/2018 FINDINGS: Cardiac shadow is stable in appearance. The lungs are well aerated without focal infiltrate or sizable effusion. Degenerative changes about the right shoulder joint seen. The known right thyroid nodule is not well appreciated on this exam. IMPRESSION: No acute infiltrate is noted. Electronically Signed   By: Inez Catalina M.D.   On: 12/11/2018 19:07   Dg Chest Port 1 View  Result Date: 11/13/2018 CLINICAL DATA:  Shortness of breath.  COVID-19. EXAM: PORTABLE CHEST 1 VIEW COMPARISON:  10/23/2018 FINDINGS: The heart size and mediastinal contours are within normal limits. Both lungs are clear. The visualized skeletal structures are unremarkable. IMPRESSION: No active disease. Electronically Signed   By: Lorriane Shire M.D.   On: 11/13/2018 15:32   Subjective: Patient was nonverbal and not following any commands when seen this morning.  Discharge Exam: Vitals:   12/12/18 0914 12/12/18 1438  BP: (!) 146/87 (!) 173/94  Pulse: 94 (!) 108  Resp: 18 18  Temp: 98.4 F (36.9 C) 97.9 F (36.6 C)  SpO2:  99%   Vitals:   12/11/18 2336 12/12/18 0100 12/12/18 0914 12/12/18 1438  BP: 132/84  (!) 146/87 (!) 173/94  Pulse: 98  94 (!) 108  Resp: 18  18 18   Temp: 98.4 F (36.9 C)  98.4 F (36.9 C) 97.9 F (36.6  C)  TempSrc:   Oral Oral  SpO2: 97%   99%  Weight:  83.9 kg    Height:  6\' 2"  (1.88 m)      General: Chronically ill-appearing elderly man, not following any commands, in no acute distress. Cardiovascular: RRR, S1/S2 +, no rubs, no gallops Respiratory: CTA bilaterally, no wheezing, no rhonchi Abdominal: Soft, NT, ND, bowel sounds + Extremities: no edema, no cyanosis   The results of significant diagnostics from this hospitalization (including imaging, microbiology, ancillary and laboratory) are listed below for reference.     Microbiology: Recent Results (from the past 240 hour(s))  Blood Culture (routine x 2)     Status: None (Preliminary result)   Collection Time: 12/11/18  6:43 PM   Specimen: BLOOD  Result Value Ref Range Status   Specimen Description BLOOD BLOOD RIGHT FOREARM  Final   Special Requests   Final    BOTTLES DRAWN AEROBIC AND ANAEROBIC Blood Culture adequate volume   Culture   Final    NO GROWTH < 12 HOURS Performed at [facility], [address] [place], [place] [address]    Report Status PENDING  Incomplete  Blood Culture (routine x 2)     Status: None (Preliminary result)   Collection Time: 12/11/18  6:43 PM   Specimen: BLOOD  Result Value Ref Range Status  Specimen Description BLOOD BLOOD RIGHT HAND  Final   Special Requests   Final    BOTTLES DRAWN AEROBIC AND ANAEROBIC Blood Culture adequate volume   Culture   Final    NO GROWTH < 12 HOURS Performed at [facility], [place], [place] [address]    Report Status PENDING  Incomplete  SARS CORONAVIRUS 2 (TAT 6-24 HRS) Nasopharyngeal Nasopharyngeal Swab     Status: None   Collection Time: 12/11/18  8:25 PM   Specimen: Nasopharyngeal Swab  Result Value Ref Range Status   SARS Coronavirus 2 NEGATIVE NEGATIVE Final    Comment: (NOTE) SARS-CoV-2 target nucleic acids are NOT DETECTED. The SARS-CoV-2 RNA is generally detectable in upper and lower respiratory  specimens during the acute phase of infection. Negative results do not preclude SARS-CoV-2 infection, do not rule out co-infections with other pathogens, and should not be used as the sole basis for treatment or other patient management decisions. Negative results must be combined with clinical observations, patient history, and epidemiological information. The expected result is Negative. Fact Sheet for Patients: SugarRoll.be Fact Sheet for Healthcare Providers: https://www.woods-mathews.com/ This test is not yet approved or cleared by the [place] FDA and  has been authorized for detection and/or diagnosis of SARS-CoV-2 by FDA under an Emergency Use Authorization (EUA). This EUA will remain  in effect (meaning this test can be used) for the duration of the COVID-19 declaration under Section 56 4(b)(1) of the Act, 21 U.S.C. section 360bbb-3(b)(1), unless the authorization is terminated or revoked sooner. Performed at [facility], [place] [address], [place], [place] [address]   MRSA PCR Screening     Status: None   Collection Time: 12/12/18  1:21 AM   Specimen: Nasopharyngeal  Result Value Ref Range Status   MRSA by PCR NEGATIVE NEGATIVE Final    Comment:        The GeneXpert MRSA Assay (FDA approved for NASAL specimens only), is one component of a comprehensive MRSA colonization surveillance program. It is not intended to diagnose MRSA infection nor to guide or monitor treatment for MRSA infections. Performed at [facility], [address] [place], [place] [address]      Labs: BNP (last 3 results) Recent Labs    06/06/18 1600 11/14/18 0000  BNP 285.0* 0000000   Basic Metabolic Panel: Recent Labs  Lab 12/11/18 1842 12/12/18 0623  NA 148* 148*  K 3.6 3.5  CL 115* 117*  CO2 22 22  GLUCOSE 166* 158*  BUN 22 22  CREATININE 0.80 0.79  CALCIUM 8.1* 8.1*   Liver Function Tests: Recent Labs  Lab  12/11/18 1842  AST 17  ALT 10  ALKPHOS 73  BILITOT 1.3*  PROT 6.3*  ALBUMIN 2.4*   No results for input(s): LIPASE, AMYLASE in the last 168 hours. No results for input(s): AMMONIA in the last 168 hours. CBC: Recent Labs  Lab 12/11/18 1842 12/12/18 0623  WBC 8.6 8.4  NEUTROABS 6.3  --   HGB 10.6* 9.9*  HCT 34.1* 30.2*  MCV 114.4* 108.2*  PLT 277 236   Cardiac Enzymes: No results for input(s): CKTOTAL, CKMB, CKMBINDEX, TROPONINI in the last 168 hours. BNP: Invalid input(s): POCBNP CBG: No results for input(s): GLUCAP in the last 168 hours. D-Dimer No results for input(s): DDIMER in the last 72 hours. Hgb A1c No results for input(s): HGBA1C in the last 72 hours. Lipid Profile No results for input(s): CHOL, HDL, LDLCALC, TRIG, CHOLHDL, LDLDIRECT in  the last 72 hours. Thyroid function studies No results for input(s): TSH, T4TOTAL, T3FREE, THYROIDAB in the last 72 hours.  Invalid input(s): FREET3 Anemia work up No results for input(s): VITAMINB12, FOLATE, FERRITIN, TIBC, IRON, RETICCTPCT in the last 72 hours. Urinalysis    Component Value Date/Time   COLORURINE AMBER (A) 12/11/2018 1842   APPEARANCEUR CLOUDY (A) 12/11/2018 1842   APPEARANCEUR Clear 10/24/2017 1538   LABSPEC 1.026 12/11/2018 1842   PHURINE 5.0 12/11/2018 1842   GLUCOSEU NEGATIVE 12/11/2018 1842   HGBUR MODERATE (A) 12/11/2018 1842   BILIRUBINUR NEGATIVE 12/11/2018 1842   BILIRUBINUR negative 02/27/2018 1636   BILIRUBINUR Negative 10/24/2017 1538   KETONESUR 20 (A) 12/11/2018 1842   PROTEINUR 30 (A) 12/11/2018 1842   UROBILINOGEN 0.2 02/27/2018 1636   NITRITE POSITIVE (A) 12/11/2018 1842   LEUKOCYTESUR MODERATE (A) 12/11/2018 1842   Sepsis Labs Invalid input(s): PROCALCITONIN,  WBC,  LACTICIDVEN Microbiology Recent Results (from the past 240 hour(s))  Blood Culture (routine x 2)     Status: None (Preliminary result)   Collection Time: 12/11/18  6:43 PM   Specimen: BLOOD  Result Value Ref  Range Status   Specimen Description BLOOD BLOOD RIGHT FOREARM  Final   Special Requests   Final    BOTTLES DRAWN AEROBIC AND ANAEROBIC Blood Culture adequate volume   Culture   Final    NO GROWTH < 12 HOURS Performed at [facility], [address] [place], [place] [address]    Report Status PENDING  Incomplete  Blood Culture (routine x 2)     Status: None (Preliminary result)   Collection Time: 12/11/18  6:43 PM   Specimen: BLOOD  Result Value Ref Range Status   Specimen Description BLOOD BLOOD RIGHT HAND  Final   Special Requests   Final    BOTTLES DRAWN AEROBIC AND ANAEROBIC Blood Culture adequate volume   Culture   Final    NO GROWTH < 12 HOURS Performed at [facility], [address] [place], [place] [address]    Report Status PENDING  Incomplete  SARS CORONAVIRUS 2 (TAT 6-24 HRS) Nasopharyngeal Nasopharyngeal Swab     Status: None   Collection Time: 12/11/18  8:25 PM   Specimen: Nasopharyngeal Swab  Result Value Ref Range Status   SARS Coronavirus 2 NEGATIVE NEGATIVE Final    Comment: (NOTE) SARS-CoV-2 target nucleic acids are NOT DETECTED. The SARS-CoV-2 RNA is generally detectable in upper and lower respiratory specimens during the acute phase of infection. Negative results do not preclude SARS-CoV-2 infection, do not rule out co-infections with other pathogens, and should not be used as the sole basis for treatment or other patient management decisions. Negative results must be combined with clinical observations, patient history, and epidemiological information. The expected result is Negative. Fact Sheet for Patients: SugarRoll.be Fact Sheet for Healthcare Providers: https://www.woods-mathews.com/ This test is not yet approved or cleared by the [place] FDA and  has been authorized for detection and/or diagnosis of SARS-CoV-2 by FDA under an Emergency Use Authorization (EUA). This EUA will remain   in effect (meaning this test can be used) for the duration of the COVID-19 declaration under Section 56 4(b)(1) of the Act, 21 U.S.C. section 360bbb-3(b)(1), unless the authorization is terminated or revoked sooner. Performed at [facility], [place] [address], [place], [place] [address]   MRSA PCR Screening     Status: None   Collection Time: 12/12/18  1:21 AM   Specimen: Nasopharyngeal  Result Value  Ref Range Status   MRSA by PCR NEGATIVE NEGATIVE Final    Comment:        The GeneXpert MRSA Assay (FDA approved for NASAL specimens only), is one component of a comprehensive MRSA colonization surveillance program. It is not intended to diagnose MRSA infection nor to guide or monitor treatment for MRSA infections. Performed at [facility], [address], [place],  [address]     Time coordinating discharge: Over 30 minutes  SIGNED:  Lorella Nimrod, MD  [facility] 12/12/2018, 3:07 PM Pager [phone]  If 7PM-7AM, please contact night-coverage [email or web address] Password [email or web address]  This record has been created using Systems analyst. Errors have been sought and corrected,but may not always be located. Such creation errors do not reflect on the standard of care.

## 2018-12-12 NOTE — Consult Note (Signed)
Consultation Note Date: 12/12/2018   Patient Name: Chase Simmons  DOB: 03/20/1933  MRN: [id number]  Age / Sex: 85 y.o., male  PCP: [name], MD Referring Physician: Lorella Nimrod, MD  Reason for Consultation: Establishing goals of care  HPI/Patient Profile: Chase Simmons is a 85 y.o. male with medical history significant of advanced dementia,chronic systolic heart failure, paroxysmal atrial fibrillation(not a good candidate for anticoagulation due to frailty andadvancedage), hypertension, COPD, type 2 diabetes, recent COVID infection(admitted to [facility] from 10/15-10/21)who presented from hospice house at the request of daughter for fever and altered mental status.  Patient was recently hospitalized from 11/10-12/1 for massive pulmonary embolism requiring thrombolysis and bilateral pulmonary artery TPA. Patient had a delayed recovery course with poor PO intake and an unsuccessful NG tube placement attempt due to agitation. Family ultimately decided on and agreed upon comfort care, and he was discharged to hospice house. He returned for current admission due to fever and altered mental status noted by his daughter. Family members reportedly now in disagreement about goal of care and would like him to be admitted for further workup and treatment.   Clinical Assessment and Goals of Care: Patient is resting in bed. He is alert. He only says "ha" repeatedly upon being spoken to.   Attempted to reach daughter [name] unsuccessfully. Called daughter [name]. She states she has been well updated. She states she and her 2 siblings are in disagreement about their father's care, and the family dynamics "are bad". She states she understands "majority rules", but did not want her father to be shifted to comfort care as a year ago he was fine. January of this year things began to change with his cognitive  status. Following his June hospitalization he sharply declined. He was able to eat himself, and was conversive. She states he knew her name as of last admission, and feels his appetite was improving from what it had been and wanted him to have the opportunity to continue to improve.   She discusses concern over a foley catheter that was placed at hospice. She states she was okay with the condom cath, but not a foley as it is a source of infection, which he got. She states he appeared uncomfortable lat night despite their efforts, and she wanted him sent her for treatment. She states if it were PNA or infection in a wound, that would be part of the natural progression of his established conditions, and would not have necessitated being moved out of the hospice facility, but the foley was an avoidable infection, in addition to his discomfort. She voices guilt over circumstances around the death of her fiance. She wishes she would have done more for him, but understands he may have suffered more. Discussed what her father would want for himself. She states she does not know, but does know that he was a Nurse, adult, and that if the rolls were reversed, he would do anything possible for her as long as possible. She would like to visit  him here this evening.   Spoke with staff following conversation with [name], and plans in place to D/C back to hospice. Called Sherry back. She states she would like for him to have a course of antibiotics if he is willing to take the medication, and would like the foley removed and a condom cath reapplied unless he develops retention. Primary team made aware of her requests as well as hospice liasion.       SUMMARY OF RECOMMENDATIONS   Plans for return to hospice facility.     Prognosis:   < 2 weeks       Primary Diagnoses: Present on Admission: . Chronic systolic CHF (congestive heart failure) ([place]) . PAF (paroxysmal atrial fibrillation) ([place]) . Dementia with  behavioral disturbance ([place])   I have reviewed the medical record, interviewed the patient and family, and examined the patient. The following aspects are pertinent.  Past Medical History:  Diagnosis Date  . Allergy   . Bladder cancer ([place])    a. followed by Dr. Jacqlyn Larsen  . Chronic combined systolic (congestive) and diastolic (congestive) heart failure ([place])    a. 07/2016 Echo: EF 30-35%, sev mid-apicalanteroseptal, ant, inf HK, Gr1 DD.  Marland Kitchen COPD (chronic obstructive pulmonary disease) ([place])   . Hyperlipidemia   . Hypertension   . NICM (nonischemic cardiomyopathy) ([place])    a. 07/2016 Echo: EF 30-35%, sev mid-apicalanteroseptal, ant, inf HK, Gr1 DD, mild MR, mildly dil LA, PASP 34mmHg - ? stress induced CM.  Marland Kitchen Nonobstructive Coronary atherosclerosis    a. 07/2016 Cath: LM nl, LAD nl, D1 40ost, LCX 34m, RCA nl, EF 25-30%.  Marland Kitchen PAF (paroxysmal atrial fibrillation) ([place])    a. Dx 08/2013. CHA2DS2VASc = 6-->no OAC 2/2 h/o falls and nocompliance.  . Tremor, essential    sees [facility] neurologist  . Type 2 diabetes mellitus with complication, without long-term current use of insulin ([place]) 08/01/2012  . Urinary incontinence    Social History   Socioeconomic History  . Marital status: Legally Separated    Spouse name: Not on file  . Number of children: 3  . Years of education: 5  . Highest education level: Not on file  Occupational History  . Occupation: Retired Investment banker, operational  . Financial resource strain: Not hard at all  . Food insecurity    Worry: Never true    Inability: Never true  . Transportation needs    Medical: No    Non-medical: No  Tobacco Use  . Smoking status: Former Smoker    Packs/day: 1.50    Years: 40.00    Pack years: 60.00    Quit date: 02/03/2003    Years since quitting: 15.8  . Smokeless tobacco: Never Used  Substance and Sexual Activity  . Alcohol use: Yes    Alcohol/week: 12.0 standard drinks    Types: 12 Standard drinks or equivalent per week     Frequency: Never    Comment: 1 drink with dinner  . Drug use: No  . Sexual activity: Never  Lifestyle  . Physical activity    Days per week: 0 days    Minutes per session: Not on file  . Stress: Not at all  Relationships  . Social Herbalist on phone: Not on file    Gets together: Not on file    Attends religious service: Not on file    Active member of club or organization: Not on file    Attends meetings of clubs  or organizations: Not on file    Relationship status: Not on file  Other Topics Concern  . Not on file  Social History Narrative   Patient grew up in [place], [place]. He is separated from his wife since 73. They never divorced. Patient has 1 son and 2 daughters. 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   Family History  Problem Relation Age of Onset  . Arthritis Mother   . Dementia Mother   . Cancer Maternal Uncle        prostate  . Cancer Maternal Aunt        breast cancer   Scheduled Meds: . enoxaparin (LOVENOX) injection  40 mg Subcutaneous Q24H  . QUEtiapine  50 mg Oral BID   Continuous Infusions: . cefTRIAXone (ROCEPHIN)  IV Stopped (12/12/18 0338)   PRN Meds:. Medications Prior to Admission:  Prior to Admission medications   Medication Sig Start Date End Date Taking? Authorizing Provider  antiseptic oral rinse (BIOTENE) LIQD Apply 15 mLs topically as needed for dry mouth. 12/04/18  Yes Nolberto Hanlon, MD  glycopyrrolate (ROBINUL) 0.2 MG/ML injection Inject 1.5 mLs (0.3 mg total) into the vein every 4 (four) hours as needed (excessive secretions). 12/04/18  Yes Nolberto Hanlon, MD  LORazepam (ATIVAN) 2 MG/ML injection Inject 0.25-0.5 mLs (0.5-1 mg total) into the vein every 4 (four) hours as needed for anxiety (agitation). 12/04/18  Yes Nolberto Hanlon, MD  morphine 2 MG/ML injection Inject 0.5-1 mLs (1-2 mg total) into the vein every hour as needed (air hunger). 12/04/18  Yes Nolberto Hanlon, MD  Morphine Sulfate (MORPHINE CONCENTRATE) 10 MG/0.5ML  SOLN concentrated solution Place 0.25 mLs (5 mg total) under the tongue every 2 (two) hours as needed for moderate pain (or dyspnea). 12/04/18  Yes Nolberto Hanlon, MD  mouth rinse LIQD solution 15 mLs by Mouth Rinse route 2 times daily at 12 noon and 4 pm. 12/04/18  Yes Nolberto Hanlon, MD  ondansetron (ZOFRAN) 4 MG/2ML SOLN injection Inject 2 mLs (4 mg total) into the vein every 6 (six) hours as needed for nausea. 12/04/18  Yes Nolberto Hanlon, MD  polyethylene glycol (MIRALAX / GLYCOLAX) 17 g packet Take 17 g by mouth daily as needed for mild constipation. 12/04/18  Yes Nolberto Hanlon, MD  polyvinyl alcohol (LIQUIFILM TEARS) 1.4 % ophthalmic solution Place 1 drop into both eyes 4 (four) times daily as needed for dry eyes. 12/04/18  Yes Nolberto Hanlon, MD  QUEtiapine (SEROQUEL) 50 MG tablet Take 50 mg by mouth 2 (two) times daily.  10/06/18  Yes [provider]  sodium chloride (OCEAN) 0.65 % SOLN nasal spray Place 1 spray into both nostrils as needed for congestion. 12/04/18  Yes Nolberto Hanlon, MD   Allergies  Allergen Reactions  . Latex Itching  . Haldol [Haloperidol Lactate] Other (See Comments)    Increased agitation  . Haldol [Haloperidol]    Review of Systems  Unable to perform ROS   Physical Exam Pulmonary:     Effort: Pulmonary effort is normal.  Neurological:     Mental Status: He is alert.     Vital Signs: BP (!) 146/87 (BP Location: Right Arm)   Pulse 94   Temp 98.4 F (36.9 C) (Oral)   Resp 18   Ht 6\' 2"  (1.88 m)   Wt 83.9 kg   SpO2 97%   BMI 23.75 kg/m  Pain Scale: PAINAD       SpO2: SpO2: 97 % O2 Device:SpO2: 97 % O2  Flow Rate: .   IO: Intake/output summary:   Intake/Output Summary (Last 24 hours) at 12/12/2018 1158 Last data filed at 12/12/2018 0535 Gross per 24 hour  Intake 1650 ml  Output 100 ml  Net 1550 ml    LBM: Last BM Date: (PTA) Baseline Weight: Weight: 86.2 kg Most recent weight: Weight: 83.9 kg     Palliative Assessment/Data:     Time In:  12:20 Time Out: 2:51 Time Total: 2 hours 31 min Greater than 50%  of this time was spent counseling and coordinating care related to the above assessment and plan.  Signed by: Asencion Gowda, NP   Please contact Palliative Medicine Team phone at [phone] for questions and concerns.  For individual provider: See Shea Evans

## 2018-12-13 LAB — URINE CULTURE: Culture: >=100000 — AB

## 2018-12-16 LAB — CULTURE, BLOOD (ROUTINE X 2)
Culture: NO GROWTH
Culture: NO GROWTH
Special Requests: ADEQUATE
Special Requests: ADEQUATE

## 2019-01-04 DEATH — deceased
